# Patient Record
Sex: Male | Born: 1970 | Hispanic: Yes | Marital: Single | Smoking: Former smoker
Health system: Southern US, Community
[De-identification: ages and names within clinical notes are randomized; demographics above are authoritative.]

## PROBLEM LIST (undated history)

## (undated) DIAGNOSIS — E119 Type 2 diabetes mellitus without complications: Secondary | ICD-10-CM

## (undated) DIAGNOSIS — G4730 Sleep apnea, unspecified: Secondary | ICD-10-CM

## (undated) DIAGNOSIS — I10 Essential (primary) hypertension: Secondary | ICD-10-CM

## (undated) DIAGNOSIS — N289 Disorder of kidney and ureter, unspecified: Secondary | ICD-10-CM

## (undated) HISTORY — PX: OTHER SURGICAL HISTORY: SHX169

---

## 2008-04-01 ENCOUNTER — Inpatient Hospital Stay: Payer: Self-pay | Admitting: Psychiatry

## 2009-02-24 ENCOUNTER — Emergency Department: Payer: Self-pay | Admitting: Emergency Medicine

## 2009-06-27 ENCOUNTER — Ambulatory Visit: Payer: Self-pay | Admitting: Family Medicine

## 2009-07-02 ENCOUNTER — Ambulatory Visit: Payer: Self-pay | Admitting: Family Medicine

## 2010-11-17 ENCOUNTER — Emergency Department: Payer: Self-pay | Admitting: Internal Medicine

## 2013-09-29 DIAGNOSIS — E875 Hyperkalemia: Secondary | ICD-10-CM | POA: Insufficient documentation

## 2013-09-29 DIAGNOSIS — I10 Essential (primary) hypertension: Secondary | ICD-10-CM | POA: Insufficient documentation

## 2013-09-29 DIAGNOSIS — N189 Chronic kidney disease, unspecified: Secondary | ICD-10-CM

## 2013-09-29 DIAGNOSIS — N179 Acute kidney failure, unspecified: Secondary | ICD-10-CM | POA: Insufficient documentation

## 2013-09-29 DIAGNOSIS — E119 Type 2 diabetes mellitus without complications: Secondary | ICD-10-CM | POA: Insufficient documentation

## 2013-11-22 DIAGNOSIS — E669 Obesity, unspecified: Secondary | ICD-10-CM | POA: Insufficient documentation

## 2014-01-30 DIAGNOSIS — N184 Chronic kidney disease, stage 4 (severe): Secondary | ICD-10-CM | POA: Insufficient documentation

## 2014-03-26 ENCOUNTER — Ambulatory Visit: Payer: Self-pay | Admitting: Anesthesiology

## 2014-03-26 DIAGNOSIS — Z01810 Encounter for preprocedural cardiovascular examination: Secondary | ICD-10-CM

## 2014-03-26 DIAGNOSIS — I10 Essential (primary) hypertension: Secondary | ICD-10-CM

## 2014-04-04 ENCOUNTER — Ambulatory Visit: Payer: Self-pay | Admitting: Vascular Surgery

## 2014-04-04 LAB — URINALYSIS, COMPLETE
Bilirubin,UR: NEGATIVE
Glucose,UR: 50 mg/dL (ref 0–75)
Hyaline Cast: 9
Ketone: NEGATIVE
LEUKOCYTE ESTERASE: NEGATIVE
NITRITE: NEGATIVE
Ph: 5 (ref 4.5–8.0)
RBC,UR: 1 /HPF (ref 0–5)
SPECIFIC GRAVITY: 1.011 (ref 1.003–1.030)
Squamous Epithelial: <1
WBC UR: 2 /HPF (ref 0–5)

## 2014-04-04 LAB — CBC
HCT: 33.5 % — AB (ref 40.0–52.0)
HGB: 11.4 g/dL — AB (ref 13.0–18.0)
MCH: 30.8 pg (ref 26.0–34.0)
MCHC: 33.9 g/dL (ref 32.0–36.0)
MCV: 91 fL (ref 80–100)
PLATELETS: 226 10*3/uL (ref 150–440)
RBC: 3.69 10*6/uL — ABNORMAL LOW (ref 4.40–5.90)
RDW: 13.3 % (ref 11.5–14.5)
WBC: 7.6 10*3/uL (ref 3.8–10.6)

## 2014-04-04 LAB — BASIC METABOLIC PANEL
Anion Gap: 9 (ref 7–16)
BUN: 68 mg/dL — ABNORMAL HIGH (ref 7–18)
CALCIUM: 7.8 mg/dL — AB (ref 8.5–10.1)
CHLORIDE: 110 mmol/L — AB (ref 98–107)
CO2: 25 mmol/L (ref 21–32)
Creatinine: 4.72 mg/dL — ABNORMAL HIGH (ref 0.60–1.30)
EGFR (African American): 18 — ABNORMAL LOW
EGFR (Non-African Amer.): 15 — ABNORMAL LOW
GLUCOSE: 101 mg/dL — AB (ref 65–99)
Osmolality: 307 (ref 275–301)
POTASSIUM: 5.1 mmol/L (ref 3.5–5.1)
SODIUM: 144 mmol/L (ref 136–145)

## 2014-04-08 ENCOUNTER — Ambulatory Visit: Payer: Self-pay | Admitting: Nephrology

## 2014-04-08 LAB — RENAL FUNCTION PANEL
Albumin: 3.3 g/dL — ABNORMAL LOW (ref 3.4–5.0)
Anion Gap: 8 (ref 7–16)
BUN: 60 mg/dL — ABNORMAL HIGH (ref 7–18)
Calcium, Total: 7.5 mg/dL — ABNORMAL LOW (ref 8.5–10.1)
Chloride: 111 mmol/L — ABNORMAL HIGH (ref 98–107)
Co2: 24 mmol/L (ref 21–32)
Creatinine: 4.89 mg/dL — ABNORMAL HIGH (ref 0.60–1.30)
EGFR (African American): 17 — ABNORMAL LOW
EGFR (Non-African Amer.): 14 — ABNORMAL LOW
Glucose: 103 mg/dL — ABNORMAL HIGH (ref 65–99)
Osmolality: 302 (ref 275–301)
Phosphorus: 3.9 mg/dL (ref 2.5–4.9)
Potassium: 5.1 mmol/L (ref 3.5–5.1)
Sodium: 143 mmol/L (ref 136–145)

## 2014-04-08 LAB — MAGNESIUM: MAGNESIUM: 2 mg/dL

## 2014-04-12 ENCOUNTER — Ambulatory Visit: Payer: Self-pay | Admitting: Vascular Surgery

## 2014-04-15 ENCOUNTER — Ambulatory Visit: Payer: Self-pay | Admitting: Nephrology

## 2014-04-15 LAB — RENAL FUNCTION PANEL
ALBUMIN: 3.3 g/dL — AB (ref 3.4–5.0)
Anion Gap: 9 (ref 7–16)
BUN: 65 mg/dL — AB (ref 7–18)
CALCIUM: 8.2 mg/dL — AB (ref 8.5–10.1)
CHLORIDE: 106 mmol/L (ref 98–107)
CO2: 25 mmol/L (ref 21–32)
Creatinine: 4.84 mg/dL — ABNORMAL HIGH (ref 0.60–1.30)
EGFR (African American): 17 — ABNORMAL LOW
EGFR (Non-African Amer.): 14 — ABNORMAL LOW
GLUCOSE: 110 mg/dL — AB (ref 65–99)
OSMOLALITY: 299 (ref 275–301)
POTASSIUM: 5.5 mmol/L — AB (ref 3.5–5.1)
Phosphorus: 6.3 mg/dL — ABNORMAL HIGH (ref 2.5–4.9)
SODIUM: 140 mmol/L (ref 136–145)

## 2014-04-15 LAB — MAGNESIUM: MAGNESIUM: 2.2 mg/dL

## 2014-04-25 ENCOUNTER — Ambulatory Visit: Payer: Self-pay | Admitting: Vascular Surgery

## 2014-04-30 ENCOUNTER — Ambulatory Visit: Payer: Self-pay | Admitting: Vascular Surgery

## 2014-04-30 LAB — CBC
HCT: 33.9 % — ABNORMAL LOW (ref 40.0–52.0)
HGB: 11.5 g/dL — AB (ref 13.0–18.0)
MCH: 30.6 pg (ref 26.0–34.0)
MCHC: 33.8 g/dL (ref 32.0–36.0)
MCV: 90 fL (ref 80–100)
Platelet: 238 10*3/uL (ref 150–440)
RBC: 3.75 10*6/uL — ABNORMAL LOW (ref 4.40–5.90)
RDW: 13.2 % (ref 11.5–14.5)
WBC: 8.5 10*3/uL (ref 3.8–10.6)

## 2014-04-30 LAB — BASIC METABOLIC PANEL
Anion Gap: 8 (ref 7–16)
BUN: 61 mg/dL — AB (ref 7–18)
CALCIUM: 7.8 mg/dL — AB (ref 8.5–10.1)
CHLORIDE: 109 mmol/L — AB (ref 98–107)
Co2: 25 mmol/L (ref 21–32)
Creatinine: 4.4 mg/dL — ABNORMAL HIGH (ref 0.60–1.30)
EGFR (African American): 19 — ABNORMAL LOW
EGFR (Non-African Amer.): 16 — ABNORMAL LOW
Glucose: 94 mg/dL (ref 65–99)
OSMOLALITY: 300 (ref 275–301)
Potassium: 4.3 mmol/L (ref 3.5–5.1)
SODIUM: 142 mmol/L (ref 136–145)

## 2014-05-03 ENCOUNTER — Ambulatory Visit: Payer: Self-pay | Admitting: Vascular Surgery

## 2014-05-22 ENCOUNTER — Ambulatory Visit: Payer: Self-pay | Admitting: Vascular Surgery

## 2014-10-05 NOTE — Op Note (Signed)
PATIENT NAME:  Jake Gomez, Jake Gomez MR#:  [id number] DATE OF BIRTH:  1971-04-30  DATE OF PROCEDURE:  04/12/2014  PREOPERATIVE DIAGNOSIS: Diffuse muscle pain, elevated creatinine kinase.   POSTOPERATIVE DIAGNOSIS: Diffuse muscle pain, elevated creatinine kinase.   PROCEDURE PERFORMED: Right thigh muscle biopsy.   ANESTHESIA: General.   ESTIMATED BLOOD LOSS: 5 mL.   COMPLICATIONS: None.   SPECIMEN: Right thigh biopsy.   INDICATION FOR SURGERY: [name] is a pleasant 44 year old male with history of elevated creatinine kinase and diffuse muscle aches. He was brought to the Operating Room for a muscle biopsy for possible etiology of rhabdomyolysis.   DESCRIPTION OF PROCEDURE: Informed consent was obtained. [name] was brought to the operating room. He was induced was induced, endotracheal tube was placed. General anesthesia was administered. His abdomen and right leg were prepped and draped in standard surgical fashion. A timeout was then performed correctly identifying the patient, name, operative site, and procedure to be performed as Dr. Delana Meyer performed a peritoneal dialysis catheter placement. I performed a muscle biopsy in the patient's right thigh. An incision was made on his right thigh overlying the muscle. It was deepened down to the fascia. The fascia was incised. The muscle was clamped on both ends and was excised using scalpel. There was minimal bleeding. Two specimens were taken. The second was taken via clamp to prevent retraction. After this was made hemostatic, the wound was closed in layers. A running 3-0 Vicryl suture was used to close the muscular fascia, interrupted 3-0 Vicryl deep dermal was used to approximate the skin, and a 4-0 Monocryl running subcuticular was used to approximate the skin. Dermabond was then placed over the wound. The patient was then awoken after Dr. Nino Parsley part was completed but then drapes were taken down and the patient was brought to the postanesthesia  care unit. There were no immediate complications. Needle, sponge, and instrument count was correct at the end of the procedure.    ____________________________ Glena Norfolk. Tracie Lindbloom, MD cal:at D: 04/12/2014 14:36:25 ET T: 04/12/2014 16:04:26 ET JOB#: [id number]  cc: Harrell Gave A. Kazue Cerro, MD, <Dictator> Floyde Parkins MD ELECTRONICALLY SIGNED 04/16/2014 7:06

## 2014-10-05 NOTE — Op Note (Signed)
PATIENT NAME:  Jake Gomez, Jake Gomez MR#:  [id number] DATE OF BIRTH:  [birth year]  DATE OF PROCEDURE:  05/03/2014  PREOPERATIVE DIAGNOSES: 1. Complication of peritoneal dialysis catheter with poor return and difficulty flushing the catheter.  2. Stage V renal insufficiency.  3. Morbid obesity.   POSTOPERATIVE DIAGNOSES:  1. Complication of peritoneal dialysis catheter with poor return and difficulty flushing the catheter.  2. Stage V renal insufficiency.  3. Morbid obesity.   PROCEDURE PERFORMED: Laparoscopic-assisted revision of peritoneal dialysis catheter.   SURGEON: Katha Cabal, MD.   ANESTHESIA: General by endotracheal intubation.   FLUIDS: Per anesthesia record.   ESTIMATED BLOOD LOSS: Minimal.   SPECIMEN: None.   INDICATIONS: [name] is a 44 year old gentleman who underwent placement of a peritoneal dialysis catheter approximately a month ago. Initially, the catheter did flush well but in the last week they have had increasing difficulty with poor return as well as difficulty flushing the catheter. The risks and benefits were reviewed. All questions answered. The patient has agreed to proceed with revision.   DESCRIPTION OF PROCEDURE: The patient is taken to the operating room and placed in the supine position. General anesthesia is induced and appropriate invasive monitors are placed. He is positioned supine. His abdomen is prepped and draped in a sterile fashion including the catheter. Appropriate timeout is called. Marcaine 0.25% with epinephrine is infiltrated in the soft tissue overlying the skin and incisional scar in the left lower quadrant. This is reincised with a 15 blade. Dissection is carried down to expose the metal connector. KUB demonstrated the possibility of a kink at the upper margin of this metal connector. Upon exposing the connector in the peritoneal catheter tubing for several centimeters both above and below the connector, there is a smooth contour. There is no  evidence whatsoever of any kink or significant angulation.   A saline moistened sponge was then placed in this wound. The previous midline incision was then infiltrated with the Marcaine and then reopened with a 15 blade scalpel. Dissection is carried down to expose the fascia and 2-0 Vicryl sutures are placed opposite the midline and a fresh, clean, 15 blade is used to incise the fascia. The peritoneal cavity is opened without difficulty and the fascial edges grasped with Allis clamps and a Hasson port is put in. The abdomen is insufflated to 15 mmHg pressure with CO2. The 5 mm trocars, 1 in the right lower quadrant, 1 in the midline between the left upper and lower quadrants are then introduced. The catheter is grasped and noted to be filled with fibrinous, proteinaceous material throughout almost its entire length. Catheter was identified in the right lower quadrant laterally, not in the midline down in the pelvis, so this does appear to have moved significantly from the initial placement.   The catheter is then pulled through the trocar in the right lower quadrant and cleaned of all of the fibrinous material. It is irrigated copiously. It is submerged in sterile saline and then aspirated vigorously. It aspirates and flushes quite easily now. The catheter and trocar are then returned to the peritoneal cavity. A Pierce retractor is then advanced through the right lower quadrant trocar and a grasper is used from the left side and the catheter is then advanced down into the deep pelvis, reflecting back the bowels and omental tissues with the Levada Dy to ensure that the catheter is all the way down posterior to the bladder. The catheter is then irrigated with 240 mL of saline and  immediately 225 mL is returned as measured in the emesis basin. The gas is then expelled under direct visualization. The catheter is observed until the gas is essentially gone. The catheter did not move. All trocars are then removed.    The midline incision in the supraumbilical where the Hasson was, was closed with 0 Vicryl and the incision was closed with 2 layers of 3-0 Vicryl, followed by 4-0 Monocryl subcuticular. The left lower quadrant incision was closed with a layer of 2-0 Vicryl, followed by 2 layers of 3-0 Vicryl, followed by 4-0 Monocryl subcuticular and the 5 mm trocar sites were closed with 4-0 Monocryl subcuticular. Dermabond is applied. A new hub and Betadine impregnated cap is applied. Sterile dressing is applied around the exit site of the catheter. The patient tolerated the procedure well. There were no immediate complications. Sponge and needle counts were correct and he was taken to the recovery area in excellent condition.     ____________________________ Katha Cabal, MD ggs:TT D: 05/03/2014 13:32:00 ET T: 05/03/2014 15:23:44 ET JOB#: [id number]  cc: Katha Cabal, MD, <Dictator> Murlean Iba, MD Katha Cabal MD ELECTRONICALLY SIGNED 05/14/2014 13:00

## 2014-10-05 NOTE — Op Note (Signed)
PATIENT NAME:  Jake Gomez, Jake Gomez MR#:  [id number] DATE OF BIRTH:  [birth year]  DATE OF PROCEDURE:  04/12/2014  PREOPERATIVE DIAGNOSIS: Stage IV renal insufficiency.  POSTOPERATIVE DIAGNOSIS: Stage IV renal insufficiency.  PROCEDURE PERFORMED: Laparoscopic-assisted insertion of peritoneal dialysis catheter.   SURGEON: Katha Cabal, MD   ANESTHESIA: General by endotracheal intubation.   FLUIDS: Per anesthesia record.   ESTIMATED BLOOD LOSS: Minimal.   SPECIMEN: None.   INDICATIONS: Jake Gomez is a 44 year old gentleman who has had deterioration of his renal function and will be requiring hemodialysis in the next few months. He is therefore undergoing placement of a peritoneal dialysis catheter in preparation. The risks and benefits are reviewed. All questions answered. The patient has agreed to proceed. Of note, the patient will also be undergoing a muscle biopsy of the right thigh by Dr. Rexene Edison, who will dictate this procedure as a separate procedure, but for the record, it was done on concomitantly under the same anesthesia.   DESCRIPTION OF PROCEDURE: The patient is taken to the operating room and placed in the supine position. After adequate general anesthesia is induced and appropriate invasive monitors placed, his abdomen is prepped and draped in a sterile fashion. Appropriate timeout is called.   A vertical incision is made in the midline supraumbilically and carried down to the fascia. The fascia is then grasped with rat-tooth forceps and an 0 Vicryl pursestring suture is placed.   Small curvilinear incision is created in the left lower quadrant just extending from the mid rectus laterally and, again, the dissection is carried down to expose the fascia. An incision is then made just lateral to the midline approximately 2 fingerbreadths below the costal margin and again the dissection is carried down to essentially expose fascia. Exit site is marked but not selected at this time.    Bone hook is then used to elevate the fascia and a Veress needle is introduced without difficulty. Saline test is positive, and subsequently low-flow insufflation is performed. High flow is then utilized to achieve an intra-abdominal pressure of 15 mmHg.   A 5 mm trocar is then introduced, and the camera. Visual inspection of the viscera does not identify any abnormalities.   With the camera and palpation the left lower quadrant incision is localized, and under direct visualization a needle is inserted into the peritoneal cavity. Subsequently, a pigtail peritoneal dialysis catheter is advanced over a stylet. The catheter is then positioned down deep within the pelvis and the stylet is removed. The fascia is spread open slightly and the cuff is advanced below the anterior fascia of the rectus sheath. A pursestring suture of 0 Vicryl is used to secure this.  A tunneling device is then used to pull the second half of the peritoneal dialysis catheter from the upper incision to the left lower quadrant incision, and the metal connector is used to attach the two and secured with 2-0 Ethibond around the metal connector. The catheter is then adjusted for length, the tunneling device used to create the curvilinear exit site, and then a small incision is made and the catheter is pulled through the exit site. The upper incision is then checked to make sure the catheter is not kinked. It has a very smooth contour. A plastic hub is then connected and 300 mL of saline is instilled, with the immediate return of 250 mL. The catheter is then trimmed and the titanium connector is placed. The hub assembly and then the Betadine cap are placed.  All 3 incisions are then irrigated. Trocar is removed from the midline and the pursestring sutures used to secure the fascia. The incisions are then closed in multiple layers using 2-0 Vicryl followed by 3-0 Vicryl, and the skin is all closed with 4-0 Monocryl. Dermabond is then  placed. The patient tolerated the procedure well. There were no immediate complications. Sponge and needle counts were correct, and he is taken to the recovery area in excellent condition.    ____________________________ Katha Cabal, MD ggs:ST D: 04/12/2014 18:00:09 ET T: 04/13/2014 00:39:11 ET JOB#: [id number]  cc: Katha Cabal, MD, <Dictator> Murlean Iba, MD Katha Cabal MD ELECTRONICALLY SIGNED 04/17/2014 11:46

## 2014-10-05 NOTE — Op Note (Signed)
PATIENT NAME:  Jake Gomez, Jake Gomez MR#:  [id number] DATE OF BIRTH:  1971-06-01  DATE OF PROCEDURE:  05/22/2014  PREOPERATIVE DIAGNOSES:  1.  End-stage renal disease.  2.  Poorly functioning peritoneal dialysis catheter.   POSTOPERATIVE DIAGNOSES:  1.  End-stage renal disease.  2.  Poorly functioning peritoneal dialysis catheter.   PROCEDURES: Diagnostic laparoscopy.   SURGEON: Algernon Huxley, MD.    ANESTHESIA: General.   ESTIMATED BLOOD LOSS: Minimal.   INDICATION FOR PROCEDURE: This is a gentleman with end-stage renal disease. He has a peritoneal dialysis catheter. Our office was contacted yesterday to report that his catheter would not flush or drain over the weekend and that he still desired to try to salvage this.  As such he is brought in for a laparoscopic evaluation and any procedure that can salvage the catheter.  The risks and benefits were discussed. Informed consent was obtained.   DESCRIPTION OF PROCEDURE: The patient was brought to the operative suite and after an adequate level of general anesthesia was obtained the abdomen was sterilely prepped and draped and a sterile surgical field was created. I entered the right upper quadrant with an Optiview trocar and insufflated the abdomen with carbon dioxide. The catheter was in the pelvis, appeared to be in good location, there were not dense adhesions or significant fibrinous exudate around the catheter. At this point I flushed the catheter manually with a small amount of sterile saline. It flushed well. I then elected to dwell the abdomen and drain it, 500 mL of sterile saline were run in and it ran briskly without any difficulty.  The bag was then put on the ground and 500 mL of effluent returned immediately which was clear and not fibrinous or bloody. At this point the catheter appeared to be in good location, it appeared to be functioning reasonably well, so I elected to terminate the procedure. The trocar was removed. The abdomen was  desufflated of carbon dioxide. The incision was closed with 3-0 Vicryl and 4-0 Monocryl and Dermabond was placed as dressing. The patient was awakened from anesthesia and taken to the recovery room in stable condition having tolerated the procedure well.    ____________________________ Algernon Huxley, MD jsd:bu D: 05/22/2014 14:34:52 ET T: 05/22/2014 18:05:09 ET JOB#: [id number]  cc: Algernon Huxley, MD, <Dictator> Algernon Huxley MD ELECTRONICALLY SIGNED 06/13/2014 14:15

## 2014-10-07 LAB — SURGICAL PATHOLOGY

## 2015-11-14 DIAGNOSIS — E1139 Type 2 diabetes mellitus with other diabetic ophthalmic complication: Secondary | ICD-10-CM | POA: Diagnosis not present

## 2016-01-13 DIAGNOSIS — D631 Anemia in chronic kidney disease: Secondary | ICD-10-CM | POA: Diagnosis not present

## 2016-01-13 DIAGNOSIS — D509 Iron deficiency anemia, unspecified: Secondary | ICD-10-CM | POA: Diagnosis not present

## 2016-01-13 DIAGNOSIS — N186 End stage renal disease: Secondary | ICD-10-CM | POA: Diagnosis not present

## 2016-01-13 DIAGNOSIS — Z992 Dependence on renal dialysis: Secondary | ICD-10-CM | POA: Diagnosis not present

## 2016-01-14 DIAGNOSIS — N186 End stage renal disease: Secondary | ICD-10-CM | POA: Diagnosis not present

## 2016-01-14 DIAGNOSIS — Z992 Dependence on renal dialysis: Secondary | ICD-10-CM | POA: Diagnosis not present

## 2016-01-14 DIAGNOSIS — D509 Iron deficiency anemia, unspecified: Secondary | ICD-10-CM | POA: Diagnosis not present

## 2016-01-14 DIAGNOSIS — D631 Anemia in chronic kidney disease: Secondary | ICD-10-CM | POA: Diagnosis not present

## 2016-01-15 DIAGNOSIS — D509 Iron deficiency anemia, unspecified: Secondary | ICD-10-CM | POA: Diagnosis not present

## 2016-01-15 DIAGNOSIS — N186 End stage renal disease: Secondary | ICD-10-CM | POA: Diagnosis not present

## 2016-01-15 DIAGNOSIS — D631 Anemia in chronic kidney disease: Secondary | ICD-10-CM | POA: Diagnosis not present

## 2016-01-15 DIAGNOSIS — Z992 Dependence on renal dialysis: Secondary | ICD-10-CM | POA: Diagnosis not present

## 2016-01-16 DIAGNOSIS — N186 End stage renal disease: Secondary | ICD-10-CM | POA: Diagnosis not present

## 2016-01-16 DIAGNOSIS — D509 Iron deficiency anemia, unspecified: Secondary | ICD-10-CM | POA: Diagnosis not present

## 2016-01-16 DIAGNOSIS — Z992 Dependence on renal dialysis: Secondary | ICD-10-CM | POA: Diagnosis not present

## 2016-01-16 DIAGNOSIS — D631 Anemia in chronic kidney disease: Secondary | ICD-10-CM | POA: Diagnosis not present

## 2016-01-17 DIAGNOSIS — D509 Iron deficiency anemia, unspecified: Secondary | ICD-10-CM | POA: Diagnosis not present

## 2016-01-17 DIAGNOSIS — Z992 Dependence on renal dialysis: Secondary | ICD-10-CM | POA: Diagnosis not present

## 2016-01-17 DIAGNOSIS — D631 Anemia in chronic kidney disease: Secondary | ICD-10-CM | POA: Diagnosis not present

## 2016-01-17 DIAGNOSIS — N186 End stage renal disease: Secondary | ICD-10-CM | POA: Diagnosis not present

## 2016-01-18 DIAGNOSIS — N186 End stage renal disease: Secondary | ICD-10-CM | POA: Diagnosis not present

## 2016-01-18 DIAGNOSIS — D631 Anemia in chronic kidney disease: Secondary | ICD-10-CM | POA: Diagnosis not present

## 2016-01-18 DIAGNOSIS — D509 Iron deficiency anemia, unspecified: Secondary | ICD-10-CM | POA: Diagnosis not present

## 2016-01-18 DIAGNOSIS — Z992 Dependence on renal dialysis: Secondary | ICD-10-CM | POA: Diagnosis not present

## 2016-01-19 DIAGNOSIS — Z992 Dependence on renal dialysis: Secondary | ICD-10-CM | POA: Diagnosis not present

## 2016-01-19 DIAGNOSIS — N186 End stage renal disease: Secondary | ICD-10-CM | POA: Diagnosis not present

## 2016-01-19 DIAGNOSIS — D631 Anemia in chronic kidney disease: Secondary | ICD-10-CM | POA: Diagnosis not present

## 2016-01-19 DIAGNOSIS — D509 Iron deficiency anemia, unspecified: Secondary | ICD-10-CM | POA: Diagnosis not present

## 2016-01-20 DIAGNOSIS — N186 End stage renal disease: Secondary | ICD-10-CM | POA: Diagnosis not present

## 2016-01-20 DIAGNOSIS — D631 Anemia in chronic kidney disease: Secondary | ICD-10-CM | POA: Diagnosis not present

## 2016-01-20 DIAGNOSIS — D509 Iron deficiency anemia, unspecified: Secondary | ICD-10-CM | POA: Diagnosis not present

## 2016-01-20 DIAGNOSIS — Z992 Dependence on renal dialysis: Secondary | ICD-10-CM | POA: Diagnosis not present

## 2016-01-21 DIAGNOSIS — D509 Iron deficiency anemia, unspecified: Secondary | ICD-10-CM | POA: Diagnosis not present

## 2016-01-21 DIAGNOSIS — Z992 Dependence on renal dialysis: Secondary | ICD-10-CM | POA: Diagnosis not present

## 2016-01-21 DIAGNOSIS — N186 End stage renal disease: Secondary | ICD-10-CM | POA: Diagnosis not present

## 2016-01-21 DIAGNOSIS — D631 Anemia in chronic kidney disease: Secondary | ICD-10-CM | POA: Diagnosis not present

## 2016-01-22 DIAGNOSIS — N186 End stage renal disease: Secondary | ICD-10-CM | POA: Diagnosis not present

## 2016-01-22 DIAGNOSIS — D509 Iron deficiency anemia, unspecified: Secondary | ICD-10-CM | POA: Diagnosis not present

## 2016-01-22 DIAGNOSIS — D631 Anemia in chronic kidney disease: Secondary | ICD-10-CM | POA: Diagnosis not present

## 2016-01-22 DIAGNOSIS — Z992 Dependence on renal dialysis: Secondary | ICD-10-CM | POA: Diagnosis not present

## 2016-01-23 DIAGNOSIS — D631 Anemia in chronic kidney disease: Secondary | ICD-10-CM | POA: Diagnosis not present

## 2016-01-23 DIAGNOSIS — N186 End stage renal disease: Secondary | ICD-10-CM | POA: Diagnosis not present

## 2016-01-23 DIAGNOSIS — Z992 Dependence on renal dialysis: Secondary | ICD-10-CM | POA: Diagnosis not present

## 2016-01-23 DIAGNOSIS — D509 Iron deficiency anemia, unspecified: Secondary | ICD-10-CM | POA: Diagnosis not present

## 2016-01-24 DIAGNOSIS — N186 End stage renal disease: Secondary | ICD-10-CM | POA: Diagnosis not present

## 2016-01-24 DIAGNOSIS — D631 Anemia in chronic kidney disease: Secondary | ICD-10-CM | POA: Diagnosis not present

## 2016-01-24 DIAGNOSIS — Z992 Dependence on renal dialysis: Secondary | ICD-10-CM | POA: Diagnosis not present

## 2016-01-24 DIAGNOSIS — D509 Iron deficiency anemia, unspecified: Secondary | ICD-10-CM | POA: Diagnosis not present

## 2016-01-25 DIAGNOSIS — Z992 Dependence on renal dialysis: Secondary | ICD-10-CM | POA: Diagnosis not present

## 2016-01-25 DIAGNOSIS — D509 Iron deficiency anemia, unspecified: Secondary | ICD-10-CM | POA: Diagnosis not present

## 2016-01-25 DIAGNOSIS — D631 Anemia in chronic kidney disease: Secondary | ICD-10-CM | POA: Diagnosis not present

## 2016-01-25 DIAGNOSIS — N186 End stage renal disease: Secondary | ICD-10-CM | POA: Diagnosis not present

## 2016-01-26 DIAGNOSIS — N186 End stage renal disease: Secondary | ICD-10-CM | POA: Diagnosis not present

## 2016-01-26 DIAGNOSIS — D509 Iron deficiency anemia, unspecified: Secondary | ICD-10-CM | POA: Diagnosis not present

## 2016-01-26 DIAGNOSIS — Z992 Dependence on renal dialysis: Secondary | ICD-10-CM | POA: Diagnosis not present

## 2016-01-26 DIAGNOSIS — D631 Anemia in chronic kidney disease: Secondary | ICD-10-CM | POA: Diagnosis not present

## 2016-01-27 DIAGNOSIS — N186 End stage renal disease: Secondary | ICD-10-CM | POA: Diagnosis not present

## 2016-01-27 DIAGNOSIS — D631 Anemia in chronic kidney disease: Secondary | ICD-10-CM | POA: Diagnosis not present

## 2016-01-27 DIAGNOSIS — Z992 Dependence on renal dialysis: Secondary | ICD-10-CM | POA: Diagnosis not present

## 2016-01-27 DIAGNOSIS — D509 Iron deficiency anemia, unspecified: Secondary | ICD-10-CM | POA: Diagnosis not present

## 2016-01-28 DIAGNOSIS — N186 End stage renal disease: Secondary | ICD-10-CM | POA: Diagnosis not present

## 2016-01-28 DIAGNOSIS — D631 Anemia in chronic kidney disease: Secondary | ICD-10-CM | POA: Diagnosis not present

## 2016-01-28 DIAGNOSIS — Z992 Dependence on renal dialysis: Secondary | ICD-10-CM | POA: Diagnosis not present

## 2016-01-28 DIAGNOSIS — D509 Iron deficiency anemia, unspecified: Secondary | ICD-10-CM | POA: Diagnosis not present

## 2016-01-29 DIAGNOSIS — D631 Anemia in chronic kidney disease: Secondary | ICD-10-CM | POA: Diagnosis not present

## 2016-01-29 DIAGNOSIS — D509 Iron deficiency anemia, unspecified: Secondary | ICD-10-CM | POA: Diagnosis not present

## 2016-01-29 DIAGNOSIS — Z992 Dependence on renal dialysis: Secondary | ICD-10-CM | POA: Diagnosis not present

## 2016-01-29 DIAGNOSIS — N186 End stage renal disease: Secondary | ICD-10-CM | POA: Diagnosis not present

## 2016-01-30 DIAGNOSIS — D631 Anemia in chronic kidney disease: Secondary | ICD-10-CM | POA: Diagnosis not present

## 2016-01-30 DIAGNOSIS — N186 End stage renal disease: Secondary | ICD-10-CM | POA: Diagnosis not present

## 2016-01-30 DIAGNOSIS — Z992 Dependence on renal dialysis: Secondary | ICD-10-CM | POA: Diagnosis not present

## 2016-01-30 DIAGNOSIS — D509 Iron deficiency anemia, unspecified: Secondary | ICD-10-CM | POA: Diagnosis not present

## 2016-01-31 DIAGNOSIS — N186 End stage renal disease: Secondary | ICD-10-CM | POA: Diagnosis not present

## 2016-01-31 DIAGNOSIS — D509 Iron deficiency anemia, unspecified: Secondary | ICD-10-CM | POA: Diagnosis not present

## 2016-01-31 DIAGNOSIS — D631 Anemia in chronic kidney disease: Secondary | ICD-10-CM | POA: Diagnosis not present

## 2016-01-31 DIAGNOSIS — Z992 Dependence on renal dialysis: Secondary | ICD-10-CM | POA: Diagnosis not present

## 2016-02-01 DIAGNOSIS — D509 Iron deficiency anemia, unspecified: Secondary | ICD-10-CM | POA: Diagnosis not present

## 2016-02-01 DIAGNOSIS — N186 End stage renal disease: Secondary | ICD-10-CM | POA: Diagnosis not present

## 2016-02-01 DIAGNOSIS — Z992 Dependence on renal dialysis: Secondary | ICD-10-CM | POA: Diagnosis not present

## 2016-02-01 DIAGNOSIS — D631 Anemia in chronic kidney disease: Secondary | ICD-10-CM | POA: Diagnosis not present

## 2016-02-02 DIAGNOSIS — D509 Iron deficiency anemia, unspecified: Secondary | ICD-10-CM | POA: Diagnosis not present

## 2016-02-02 DIAGNOSIS — Z992 Dependence on renal dialysis: Secondary | ICD-10-CM | POA: Diagnosis not present

## 2016-02-02 DIAGNOSIS — N186 End stage renal disease: Secondary | ICD-10-CM | POA: Diagnosis not present

## 2016-02-02 DIAGNOSIS — D631 Anemia in chronic kidney disease: Secondary | ICD-10-CM | POA: Diagnosis not present

## 2016-02-03 DIAGNOSIS — D631 Anemia in chronic kidney disease: Secondary | ICD-10-CM | POA: Diagnosis not present

## 2016-02-03 DIAGNOSIS — Z992 Dependence on renal dialysis: Secondary | ICD-10-CM | POA: Diagnosis not present

## 2016-02-03 DIAGNOSIS — D509 Iron deficiency anemia, unspecified: Secondary | ICD-10-CM | POA: Diagnosis not present

## 2016-02-03 DIAGNOSIS — N186 End stage renal disease: Secondary | ICD-10-CM | POA: Diagnosis not present

## 2016-02-04 DIAGNOSIS — N186 End stage renal disease: Secondary | ICD-10-CM | POA: Diagnosis not present

## 2016-02-04 DIAGNOSIS — Z992 Dependence on renal dialysis: Secondary | ICD-10-CM | POA: Diagnosis not present

## 2016-02-04 DIAGNOSIS — D509 Iron deficiency anemia, unspecified: Secondary | ICD-10-CM | POA: Diagnosis not present

## 2016-02-04 DIAGNOSIS — D631 Anemia in chronic kidney disease: Secondary | ICD-10-CM | POA: Diagnosis not present

## 2016-02-05 DIAGNOSIS — D509 Iron deficiency anemia, unspecified: Secondary | ICD-10-CM | POA: Diagnosis not present

## 2016-02-05 DIAGNOSIS — D631 Anemia in chronic kidney disease: Secondary | ICD-10-CM | POA: Diagnosis not present

## 2016-02-05 DIAGNOSIS — N186 End stage renal disease: Secondary | ICD-10-CM | POA: Diagnosis not present

## 2016-02-05 DIAGNOSIS — Z992 Dependence on renal dialysis: Secondary | ICD-10-CM | POA: Diagnosis not present

## 2016-02-06 DIAGNOSIS — D509 Iron deficiency anemia, unspecified: Secondary | ICD-10-CM | POA: Diagnosis not present

## 2016-02-06 DIAGNOSIS — Z992 Dependence on renal dialysis: Secondary | ICD-10-CM | POA: Diagnosis not present

## 2016-02-06 DIAGNOSIS — N186 End stage renal disease: Secondary | ICD-10-CM | POA: Diagnosis not present

## 2016-02-06 DIAGNOSIS — D631 Anemia in chronic kidney disease: Secondary | ICD-10-CM | POA: Diagnosis not present

## 2016-02-07 DIAGNOSIS — D509 Iron deficiency anemia, unspecified: Secondary | ICD-10-CM | POA: Diagnosis not present

## 2016-02-07 DIAGNOSIS — Z992 Dependence on renal dialysis: Secondary | ICD-10-CM | POA: Diagnosis not present

## 2016-02-07 DIAGNOSIS — D631 Anemia in chronic kidney disease: Secondary | ICD-10-CM | POA: Diagnosis not present

## 2016-02-07 DIAGNOSIS — N186 End stage renal disease: Secondary | ICD-10-CM | POA: Diagnosis not present

## 2016-02-08 DIAGNOSIS — D631 Anemia in chronic kidney disease: Secondary | ICD-10-CM | POA: Diagnosis not present

## 2016-02-08 DIAGNOSIS — N186 End stage renal disease: Secondary | ICD-10-CM | POA: Diagnosis not present

## 2016-02-08 DIAGNOSIS — Z992 Dependence on renal dialysis: Secondary | ICD-10-CM | POA: Diagnosis not present

## 2016-02-08 DIAGNOSIS — D509 Iron deficiency anemia, unspecified: Secondary | ICD-10-CM | POA: Diagnosis not present

## 2016-02-09 DIAGNOSIS — D631 Anemia in chronic kidney disease: Secondary | ICD-10-CM | POA: Diagnosis not present

## 2016-02-09 DIAGNOSIS — D509 Iron deficiency anemia, unspecified: Secondary | ICD-10-CM | POA: Diagnosis not present

## 2016-02-09 DIAGNOSIS — Z992 Dependence on renal dialysis: Secondary | ICD-10-CM | POA: Diagnosis not present

## 2016-02-09 DIAGNOSIS — N186 End stage renal disease: Secondary | ICD-10-CM | POA: Diagnosis not present

## 2016-02-10 DIAGNOSIS — D509 Iron deficiency anemia, unspecified: Secondary | ICD-10-CM | POA: Diagnosis not present

## 2016-02-10 DIAGNOSIS — Z992 Dependence on renal dialysis: Secondary | ICD-10-CM | POA: Diagnosis not present

## 2016-02-10 DIAGNOSIS — N186 End stage renal disease: Secondary | ICD-10-CM | POA: Diagnosis not present

## 2016-02-10 DIAGNOSIS — D631 Anemia in chronic kidney disease: Secondary | ICD-10-CM | POA: Diagnosis not present

## 2016-02-11 DIAGNOSIS — D509 Iron deficiency anemia, unspecified: Secondary | ICD-10-CM | POA: Diagnosis not present

## 2016-02-11 DIAGNOSIS — Z992 Dependence on renal dialysis: Secondary | ICD-10-CM | POA: Diagnosis not present

## 2016-02-11 DIAGNOSIS — D631 Anemia in chronic kidney disease: Secondary | ICD-10-CM | POA: Diagnosis not present

## 2016-02-11 DIAGNOSIS — N186 End stage renal disease: Secondary | ICD-10-CM | POA: Diagnosis not present

## 2016-02-12 DIAGNOSIS — D631 Anemia in chronic kidney disease: Secondary | ICD-10-CM | POA: Diagnosis not present

## 2016-02-12 DIAGNOSIS — D509 Iron deficiency anemia, unspecified: Secondary | ICD-10-CM | POA: Diagnosis not present

## 2016-02-12 DIAGNOSIS — N186 End stage renal disease: Secondary | ICD-10-CM | POA: Diagnosis not present

## 2016-02-12 DIAGNOSIS — Z992 Dependence on renal dialysis: Secondary | ICD-10-CM | POA: Diagnosis not present

## 2016-02-13 DIAGNOSIS — N186 End stage renal disease: Secondary | ICD-10-CM | POA: Diagnosis not present

## 2016-02-13 DIAGNOSIS — D509 Iron deficiency anemia, unspecified: Secondary | ICD-10-CM | POA: Diagnosis not present

## 2016-02-13 DIAGNOSIS — D631 Anemia in chronic kidney disease: Secondary | ICD-10-CM | POA: Diagnosis not present

## 2016-02-13 DIAGNOSIS — Z23 Encounter for immunization: Secondary | ICD-10-CM | POA: Diagnosis not present

## 2016-02-13 DIAGNOSIS — Z992 Dependence on renal dialysis: Secondary | ICD-10-CM | POA: Diagnosis not present

## 2016-02-14 DIAGNOSIS — N186 End stage renal disease: Secondary | ICD-10-CM | POA: Diagnosis not present

## 2016-02-14 DIAGNOSIS — D631 Anemia in chronic kidney disease: Secondary | ICD-10-CM | POA: Diagnosis not present

## 2016-02-14 DIAGNOSIS — Z23 Encounter for immunization: Secondary | ICD-10-CM | POA: Diagnosis not present

## 2016-02-14 DIAGNOSIS — Z992 Dependence on renal dialysis: Secondary | ICD-10-CM | POA: Diagnosis not present

## 2016-02-14 DIAGNOSIS — D509 Iron deficiency anemia, unspecified: Secondary | ICD-10-CM | POA: Diagnosis not present

## 2016-02-15 DIAGNOSIS — Z23 Encounter for immunization: Secondary | ICD-10-CM | POA: Diagnosis not present

## 2016-02-15 DIAGNOSIS — D631 Anemia in chronic kidney disease: Secondary | ICD-10-CM | POA: Diagnosis not present

## 2016-02-15 DIAGNOSIS — N186 End stage renal disease: Secondary | ICD-10-CM | POA: Diagnosis not present

## 2016-02-15 DIAGNOSIS — Z992 Dependence on renal dialysis: Secondary | ICD-10-CM | POA: Diagnosis not present

## 2016-02-15 DIAGNOSIS — D509 Iron deficiency anemia, unspecified: Secondary | ICD-10-CM | POA: Diagnosis not present

## 2016-02-16 DIAGNOSIS — D509 Iron deficiency anemia, unspecified: Secondary | ICD-10-CM | POA: Diagnosis not present

## 2016-02-16 DIAGNOSIS — D631 Anemia in chronic kidney disease: Secondary | ICD-10-CM | POA: Diagnosis not present

## 2016-02-16 DIAGNOSIS — Z23 Encounter for immunization: Secondary | ICD-10-CM | POA: Diagnosis not present

## 2016-02-16 DIAGNOSIS — N186 End stage renal disease: Secondary | ICD-10-CM | POA: Diagnosis not present

## 2016-02-16 DIAGNOSIS — Z992 Dependence on renal dialysis: Secondary | ICD-10-CM | POA: Diagnosis not present

## 2016-02-17 DIAGNOSIS — N186 End stage renal disease: Secondary | ICD-10-CM | POA: Diagnosis not present

## 2016-02-17 DIAGNOSIS — Z992 Dependence on renal dialysis: Secondary | ICD-10-CM | POA: Diagnosis not present

## 2016-02-17 DIAGNOSIS — Z23 Encounter for immunization: Secondary | ICD-10-CM | POA: Diagnosis not present

## 2016-02-17 DIAGNOSIS — D509 Iron deficiency anemia, unspecified: Secondary | ICD-10-CM | POA: Diagnosis not present

## 2016-02-17 DIAGNOSIS — D631 Anemia in chronic kidney disease: Secondary | ICD-10-CM | POA: Diagnosis not present

## 2016-02-18 DIAGNOSIS — Z992 Dependence on renal dialysis: Secondary | ICD-10-CM | POA: Diagnosis not present

## 2016-02-18 DIAGNOSIS — N186 End stage renal disease: Secondary | ICD-10-CM | POA: Diagnosis not present

## 2016-02-18 DIAGNOSIS — D631 Anemia in chronic kidney disease: Secondary | ICD-10-CM | POA: Diagnosis not present

## 2016-02-18 DIAGNOSIS — D509 Iron deficiency anemia, unspecified: Secondary | ICD-10-CM | POA: Diagnosis not present

## 2016-02-18 DIAGNOSIS — Z23 Encounter for immunization: Secondary | ICD-10-CM | POA: Diagnosis not present

## 2016-02-19 DIAGNOSIS — D509 Iron deficiency anemia, unspecified: Secondary | ICD-10-CM | POA: Diagnosis not present

## 2016-02-19 DIAGNOSIS — Z992 Dependence on renal dialysis: Secondary | ICD-10-CM | POA: Diagnosis not present

## 2016-02-19 DIAGNOSIS — N186 End stage renal disease: Secondary | ICD-10-CM | POA: Diagnosis not present

## 2016-02-19 DIAGNOSIS — Z23 Encounter for immunization: Secondary | ICD-10-CM | POA: Diagnosis not present

## 2016-02-19 DIAGNOSIS — D631 Anemia in chronic kidney disease: Secondary | ICD-10-CM | POA: Diagnosis not present

## 2016-02-20 DIAGNOSIS — D631 Anemia in chronic kidney disease: Secondary | ICD-10-CM | POA: Diagnosis not present

## 2016-02-20 DIAGNOSIS — Z23 Encounter for immunization: Secondary | ICD-10-CM | POA: Diagnosis not present

## 2016-02-20 DIAGNOSIS — D509 Iron deficiency anemia, unspecified: Secondary | ICD-10-CM | POA: Diagnosis not present

## 2016-02-20 DIAGNOSIS — Z992 Dependence on renal dialysis: Secondary | ICD-10-CM | POA: Diagnosis not present

## 2016-02-20 DIAGNOSIS — N186 End stage renal disease: Secondary | ICD-10-CM | POA: Diagnosis not present

## 2016-02-21 DIAGNOSIS — Z992 Dependence on renal dialysis: Secondary | ICD-10-CM | POA: Diagnosis not present

## 2016-02-21 DIAGNOSIS — Z23 Encounter for immunization: Secondary | ICD-10-CM | POA: Diagnosis not present

## 2016-02-21 DIAGNOSIS — D631 Anemia in chronic kidney disease: Secondary | ICD-10-CM | POA: Diagnosis not present

## 2016-02-21 DIAGNOSIS — N186 End stage renal disease: Secondary | ICD-10-CM | POA: Diagnosis not present

## 2016-02-21 DIAGNOSIS — D509 Iron deficiency anemia, unspecified: Secondary | ICD-10-CM | POA: Diagnosis not present

## 2016-02-22 DIAGNOSIS — D631 Anemia in chronic kidney disease: Secondary | ICD-10-CM | POA: Diagnosis not present

## 2016-02-22 DIAGNOSIS — D509 Iron deficiency anemia, unspecified: Secondary | ICD-10-CM | POA: Diagnosis not present

## 2016-02-22 DIAGNOSIS — N186 End stage renal disease: Secondary | ICD-10-CM | POA: Diagnosis not present

## 2016-02-22 DIAGNOSIS — Z992 Dependence on renal dialysis: Secondary | ICD-10-CM | POA: Diagnosis not present

## 2016-02-22 DIAGNOSIS — Z23 Encounter for immunization: Secondary | ICD-10-CM | POA: Diagnosis not present

## 2016-02-23 DIAGNOSIS — N186 End stage renal disease: Secondary | ICD-10-CM | POA: Diagnosis not present

## 2016-02-23 DIAGNOSIS — D509 Iron deficiency anemia, unspecified: Secondary | ICD-10-CM | POA: Diagnosis not present

## 2016-02-23 DIAGNOSIS — Z23 Encounter for immunization: Secondary | ICD-10-CM | POA: Diagnosis not present

## 2016-02-23 DIAGNOSIS — Z992 Dependence on renal dialysis: Secondary | ICD-10-CM | POA: Diagnosis not present

## 2016-02-23 DIAGNOSIS — D631 Anemia in chronic kidney disease: Secondary | ICD-10-CM | POA: Diagnosis not present

## 2016-02-24 DIAGNOSIS — D509 Iron deficiency anemia, unspecified: Secondary | ICD-10-CM | POA: Diagnosis not present

## 2016-02-24 DIAGNOSIS — N186 End stage renal disease: Secondary | ICD-10-CM | POA: Diagnosis not present

## 2016-02-24 DIAGNOSIS — Z23 Encounter for immunization: Secondary | ICD-10-CM | POA: Diagnosis not present

## 2016-02-24 DIAGNOSIS — Z992 Dependence on renal dialysis: Secondary | ICD-10-CM | POA: Diagnosis not present

## 2016-02-24 DIAGNOSIS — D631 Anemia in chronic kidney disease: Secondary | ICD-10-CM | POA: Diagnosis not present

## 2016-02-25 DIAGNOSIS — D631 Anemia in chronic kidney disease: Secondary | ICD-10-CM | POA: Diagnosis not present

## 2016-02-25 DIAGNOSIS — N186 End stage renal disease: Secondary | ICD-10-CM | POA: Diagnosis not present

## 2016-02-25 DIAGNOSIS — Z992 Dependence on renal dialysis: Secondary | ICD-10-CM | POA: Diagnosis not present

## 2016-02-25 DIAGNOSIS — Z23 Encounter for immunization: Secondary | ICD-10-CM | POA: Diagnosis not present

## 2016-02-25 DIAGNOSIS — D509 Iron deficiency anemia, unspecified: Secondary | ICD-10-CM | POA: Diagnosis not present

## 2016-02-26 DIAGNOSIS — Z23 Encounter for immunization: Secondary | ICD-10-CM | POA: Diagnosis not present

## 2016-02-26 DIAGNOSIS — Z992 Dependence on renal dialysis: Secondary | ICD-10-CM | POA: Diagnosis not present

## 2016-02-26 DIAGNOSIS — D631 Anemia in chronic kidney disease: Secondary | ICD-10-CM | POA: Diagnosis not present

## 2016-02-26 DIAGNOSIS — D509 Iron deficiency anemia, unspecified: Secondary | ICD-10-CM | POA: Diagnosis not present

## 2016-02-26 DIAGNOSIS — N186 End stage renal disease: Secondary | ICD-10-CM | POA: Diagnosis not present

## 2016-02-27 DIAGNOSIS — D631 Anemia in chronic kidney disease: Secondary | ICD-10-CM | POA: Diagnosis not present

## 2016-02-27 DIAGNOSIS — N186 End stage renal disease: Secondary | ICD-10-CM | POA: Diagnosis not present

## 2016-02-27 DIAGNOSIS — Z23 Encounter for immunization: Secondary | ICD-10-CM | POA: Diagnosis not present

## 2016-02-27 DIAGNOSIS — D509 Iron deficiency anemia, unspecified: Secondary | ICD-10-CM | POA: Diagnosis not present

## 2016-02-27 DIAGNOSIS — Z992 Dependence on renal dialysis: Secondary | ICD-10-CM | POA: Diagnosis not present

## 2016-02-28 DIAGNOSIS — N186 End stage renal disease: Secondary | ICD-10-CM | POA: Diagnosis not present

## 2016-02-28 DIAGNOSIS — Z992 Dependence on renal dialysis: Secondary | ICD-10-CM | POA: Diagnosis not present

## 2016-02-28 DIAGNOSIS — D631 Anemia in chronic kidney disease: Secondary | ICD-10-CM | POA: Diagnosis not present

## 2016-02-28 DIAGNOSIS — D509 Iron deficiency anemia, unspecified: Secondary | ICD-10-CM | POA: Diagnosis not present

## 2016-02-28 DIAGNOSIS — Z23 Encounter for immunization: Secondary | ICD-10-CM | POA: Diagnosis not present

## 2016-02-29 DIAGNOSIS — Z992 Dependence on renal dialysis: Secondary | ICD-10-CM | POA: Diagnosis not present

## 2016-02-29 DIAGNOSIS — D509 Iron deficiency anemia, unspecified: Secondary | ICD-10-CM | POA: Diagnosis not present

## 2016-02-29 DIAGNOSIS — N186 End stage renal disease: Secondary | ICD-10-CM | POA: Diagnosis not present

## 2016-02-29 DIAGNOSIS — D631 Anemia in chronic kidney disease: Secondary | ICD-10-CM | POA: Diagnosis not present

## 2016-02-29 DIAGNOSIS — Z23 Encounter for immunization: Secondary | ICD-10-CM | POA: Diagnosis not present

## 2016-03-01 DIAGNOSIS — Z23 Encounter for immunization: Secondary | ICD-10-CM | POA: Diagnosis not present

## 2016-03-01 DIAGNOSIS — D509 Iron deficiency anemia, unspecified: Secondary | ICD-10-CM | POA: Diagnosis not present

## 2016-03-01 DIAGNOSIS — N186 End stage renal disease: Secondary | ICD-10-CM | POA: Diagnosis not present

## 2016-03-01 DIAGNOSIS — D631 Anemia in chronic kidney disease: Secondary | ICD-10-CM | POA: Diagnosis not present

## 2016-03-01 DIAGNOSIS — Z992 Dependence on renal dialysis: Secondary | ICD-10-CM | POA: Diagnosis not present

## 2016-03-02 DIAGNOSIS — D631 Anemia in chronic kidney disease: Secondary | ICD-10-CM | POA: Diagnosis not present

## 2016-03-02 DIAGNOSIS — D509 Iron deficiency anemia, unspecified: Secondary | ICD-10-CM | POA: Diagnosis not present

## 2016-03-02 DIAGNOSIS — N186 End stage renal disease: Secondary | ICD-10-CM | POA: Diagnosis not present

## 2016-03-02 DIAGNOSIS — Z992 Dependence on renal dialysis: Secondary | ICD-10-CM | POA: Diagnosis not present

## 2016-03-02 DIAGNOSIS — Z23 Encounter for immunization: Secondary | ICD-10-CM | POA: Diagnosis not present

## 2016-03-03 DIAGNOSIS — D631 Anemia in chronic kidney disease: Secondary | ICD-10-CM | POA: Diagnosis not present

## 2016-03-03 DIAGNOSIS — D509 Iron deficiency anemia, unspecified: Secondary | ICD-10-CM | POA: Diagnosis not present

## 2016-03-03 DIAGNOSIS — Z992 Dependence on renal dialysis: Secondary | ICD-10-CM | POA: Diagnosis not present

## 2016-03-03 DIAGNOSIS — Z23 Encounter for immunization: Secondary | ICD-10-CM | POA: Diagnosis not present

## 2016-03-03 DIAGNOSIS — N186 End stage renal disease: Secondary | ICD-10-CM | POA: Diagnosis not present

## 2016-03-04 DIAGNOSIS — D509 Iron deficiency anemia, unspecified: Secondary | ICD-10-CM | POA: Diagnosis not present

## 2016-03-04 DIAGNOSIS — Z23 Encounter for immunization: Secondary | ICD-10-CM | POA: Diagnosis not present

## 2016-03-04 DIAGNOSIS — Z992 Dependence on renal dialysis: Secondary | ICD-10-CM | POA: Diagnosis not present

## 2016-03-04 DIAGNOSIS — N186 End stage renal disease: Secondary | ICD-10-CM | POA: Diagnosis not present

## 2016-03-04 DIAGNOSIS — D631 Anemia in chronic kidney disease: Secondary | ICD-10-CM | POA: Diagnosis not present

## 2016-03-05 DIAGNOSIS — Z23 Encounter for immunization: Secondary | ICD-10-CM | POA: Diagnosis not present

## 2016-03-05 DIAGNOSIS — N186 End stage renal disease: Secondary | ICD-10-CM | POA: Diagnosis not present

## 2016-03-05 DIAGNOSIS — Z992 Dependence on renal dialysis: Secondary | ICD-10-CM | POA: Diagnosis not present

## 2016-03-05 DIAGNOSIS — D509 Iron deficiency anemia, unspecified: Secondary | ICD-10-CM | POA: Diagnosis not present

## 2016-03-05 DIAGNOSIS — D631 Anemia in chronic kidney disease: Secondary | ICD-10-CM | POA: Diagnosis not present

## 2016-03-06 DIAGNOSIS — Z23 Encounter for immunization: Secondary | ICD-10-CM | POA: Diagnosis not present

## 2016-03-06 DIAGNOSIS — N186 End stage renal disease: Secondary | ICD-10-CM | POA: Diagnosis not present

## 2016-03-06 DIAGNOSIS — D509 Iron deficiency anemia, unspecified: Secondary | ICD-10-CM | POA: Diagnosis not present

## 2016-03-06 DIAGNOSIS — D631 Anemia in chronic kidney disease: Secondary | ICD-10-CM | POA: Diagnosis not present

## 2016-03-06 DIAGNOSIS — Z992 Dependence on renal dialysis: Secondary | ICD-10-CM | POA: Diagnosis not present

## 2016-03-07 DIAGNOSIS — Z992 Dependence on renal dialysis: Secondary | ICD-10-CM | POA: Diagnosis not present

## 2016-03-07 DIAGNOSIS — Z23 Encounter for immunization: Secondary | ICD-10-CM | POA: Diagnosis not present

## 2016-03-07 DIAGNOSIS — N186 End stage renal disease: Secondary | ICD-10-CM | POA: Diagnosis not present

## 2016-03-07 DIAGNOSIS — D509 Iron deficiency anemia, unspecified: Secondary | ICD-10-CM | POA: Diagnosis not present

## 2016-03-07 DIAGNOSIS — D631 Anemia in chronic kidney disease: Secondary | ICD-10-CM | POA: Diagnosis not present

## 2016-03-08 DIAGNOSIS — D509 Iron deficiency anemia, unspecified: Secondary | ICD-10-CM | POA: Diagnosis not present

## 2016-03-08 DIAGNOSIS — Z23 Encounter for immunization: Secondary | ICD-10-CM | POA: Diagnosis not present

## 2016-03-08 DIAGNOSIS — Z992 Dependence on renal dialysis: Secondary | ICD-10-CM | POA: Diagnosis not present

## 2016-03-08 DIAGNOSIS — D631 Anemia in chronic kidney disease: Secondary | ICD-10-CM | POA: Diagnosis not present

## 2016-03-08 DIAGNOSIS — N186 End stage renal disease: Secondary | ICD-10-CM | POA: Diagnosis not present

## 2016-03-09 DIAGNOSIS — Z992 Dependence on renal dialysis: Secondary | ICD-10-CM | POA: Diagnosis not present

## 2016-03-09 DIAGNOSIS — N186 End stage renal disease: Secondary | ICD-10-CM | POA: Diagnosis not present

## 2016-03-09 DIAGNOSIS — D509 Iron deficiency anemia, unspecified: Secondary | ICD-10-CM | POA: Diagnosis not present

## 2016-03-09 DIAGNOSIS — D631 Anemia in chronic kidney disease: Secondary | ICD-10-CM | POA: Diagnosis not present

## 2016-03-09 DIAGNOSIS — Z23 Encounter for immunization: Secondary | ICD-10-CM | POA: Diagnosis not present

## 2016-03-10 DIAGNOSIS — D631 Anemia in chronic kidney disease: Secondary | ICD-10-CM | POA: Diagnosis not present

## 2016-03-10 DIAGNOSIS — D509 Iron deficiency anemia, unspecified: Secondary | ICD-10-CM | POA: Diagnosis not present

## 2016-03-10 DIAGNOSIS — Z992 Dependence on renal dialysis: Secondary | ICD-10-CM | POA: Diagnosis not present

## 2016-03-10 DIAGNOSIS — Z23 Encounter for immunization: Secondary | ICD-10-CM | POA: Diagnosis not present

## 2016-03-10 DIAGNOSIS — N186 End stage renal disease: Secondary | ICD-10-CM | POA: Diagnosis not present

## 2016-03-11 DIAGNOSIS — N186 End stage renal disease: Secondary | ICD-10-CM | POA: Diagnosis not present

## 2016-03-11 DIAGNOSIS — D631 Anemia in chronic kidney disease: Secondary | ICD-10-CM | POA: Diagnosis not present

## 2016-03-11 DIAGNOSIS — D509 Iron deficiency anemia, unspecified: Secondary | ICD-10-CM | POA: Diagnosis not present

## 2016-03-11 DIAGNOSIS — Z23 Encounter for immunization: Secondary | ICD-10-CM | POA: Diagnosis not present

## 2016-03-11 DIAGNOSIS — Z992 Dependence on renal dialysis: Secondary | ICD-10-CM | POA: Diagnosis not present

## 2016-03-12 DIAGNOSIS — D631 Anemia in chronic kidney disease: Secondary | ICD-10-CM | POA: Diagnosis not present

## 2016-03-12 DIAGNOSIS — N186 End stage renal disease: Secondary | ICD-10-CM | POA: Diagnosis not present

## 2016-03-12 DIAGNOSIS — D509 Iron deficiency anemia, unspecified: Secondary | ICD-10-CM | POA: Diagnosis not present

## 2016-03-12 DIAGNOSIS — Z992 Dependence on renal dialysis: Secondary | ICD-10-CM | POA: Diagnosis not present

## 2016-03-12 DIAGNOSIS — Z23 Encounter for immunization: Secondary | ICD-10-CM | POA: Diagnosis not present

## 2016-03-13 DIAGNOSIS — N186 End stage renal disease: Secondary | ICD-10-CM | POA: Diagnosis not present

## 2016-03-13 DIAGNOSIS — Z23 Encounter for immunization: Secondary | ICD-10-CM | POA: Diagnosis not present

## 2016-03-13 DIAGNOSIS — Z992 Dependence on renal dialysis: Secondary | ICD-10-CM | POA: Diagnosis not present

## 2016-03-13 DIAGNOSIS — D509 Iron deficiency anemia, unspecified: Secondary | ICD-10-CM | POA: Diagnosis not present

## 2016-03-13 DIAGNOSIS — D631 Anemia in chronic kidney disease: Secondary | ICD-10-CM | POA: Diagnosis not present

## 2016-03-14 DIAGNOSIS — D631 Anemia in chronic kidney disease: Secondary | ICD-10-CM | POA: Diagnosis not present

## 2016-03-14 DIAGNOSIS — D509 Iron deficiency anemia, unspecified: Secondary | ICD-10-CM | POA: Diagnosis not present

## 2016-03-14 DIAGNOSIS — N186 End stage renal disease: Secondary | ICD-10-CM | POA: Diagnosis not present

## 2016-03-14 DIAGNOSIS — Z992 Dependence on renal dialysis: Secondary | ICD-10-CM | POA: Diagnosis not present

## 2016-03-14 DIAGNOSIS — N2581 Secondary hyperparathyroidism of renal origin: Secondary | ICD-10-CM | POA: Diagnosis not present

## 2016-03-15 DIAGNOSIS — N186 End stage renal disease: Secondary | ICD-10-CM | POA: Diagnosis not present

## 2016-03-15 DIAGNOSIS — D509 Iron deficiency anemia, unspecified: Secondary | ICD-10-CM | POA: Diagnosis not present

## 2016-03-15 DIAGNOSIS — D631 Anemia in chronic kidney disease: Secondary | ICD-10-CM | POA: Diagnosis not present

## 2016-03-15 DIAGNOSIS — N2581 Secondary hyperparathyroidism of renal origin: Secondary | ICD-10-CM | POA: Diagnosis not present

## 2016-03-15 DIAGNOSIS — Z992 Dependence on renal dialysis: Secondary | ICD-10-CM | POA: Diagnosis not present

## 2016-03-16 DIAGNOSIS — Z992 Dependence on renal dialysis: Secondary | ICD-10-CM | POA: Diagnosis not present

## 2016-03-16 DIAGNOSIS — D631 Anemia in chronic kidney disease: Secondary | ICD-10-CM | POA: Diagnosis not present

## 2016-03-16 DIAGNOSIS — N186 End stage renal disease: Secondary | ICD-10-CM | POA: Diagnosis not present

## 2016-03-16 DIAGNOSIS — N2581 Secondary hyperparathyroidism of renal origin: Secondary | ICD-10-CM | POA: Diagnosis not present

## 2016-03-16 DIAGNOSIS — D509 Iron deficiency anemia, unspecified: Secondary | ICD-10-CM | POA: Diagnosis not present

## 2016-03-17 DIAGNOSIS — D509 Iron deficiency anemia, unspecified: Secondary | ICD-10-CM | POA: Diagnosis not present

## 2016-03-17 DIAGNOSIS — N2581 Secondary hyperparathyroidism of renal origin: Secondary | ICD-10-CM | POA: Diagnosis not present

## 2016-03-17 DIAGNOSIS — N186 End stage renal disease: Secondary | ICD-10-CM | POA: Diagnosis not present

## 2016-03-17 DIAGNOSIS — Z992 Dependence on renal dialysis: Secondary | ICD-10-CM | POA: Diagnosis not present

## 2016-03-17 DIAGNOSIS — D631 Anemia in chronic kidney disease: Secondary | ICD-10-CM | POA: Diagnosis not present

## 2016-03-18 DIAGNOSIS — N2581 Secondary hyperparathyroidism of renal origin: Secondary | ICD-10-CM | POA: Diagnosis not present

## 2016-03-18 DIAGNOSIS — D631 Anemia in chronic kidney disease: Secondary | ICD-10-CM | POA: Diagnosis not present

## 2016-03-18 DIAGNOSIS — Z992 Dependence on renal dialysis: Secondary | ICD-10-CM | POA: Diagnosis not present

## 2016-03-18 DIAGNOSIS — D509 Iron deficiency anemia, unspecified: Secondary | ICD-10-CM | POA: Diagnosis not present

## 2016-03-18 DIAGNOSIS — N186 End stage renal disease: Secondary | ICD-10-CM | POA: Diagnosis not present

## 2016-03-19 DIAGNOSIS — Z992 Dependence on renal dialysis: Secondary | ICD-10-CM | POA: Diagnosis not present

## 2016-03-19 DIAGNOSIS — D631 Anemia in chronic kidney disease: Secondary | ICD-10-CM | POA: Diagnosis not present

## 2016-03-19 DIAGNOSIS — D509 Iron deficiency anemia, unspecified: Secondary | ICD-10-CM | POA: Diagnosis not present

## 2016-03-19 DIAGNOSIS — N186 End stage renal disease: Secondary | ICD-10-CM | POA: Diagnosis not present

## 2016-03-19 DIAGNOSIS — N2581 Secondary hyperparathyroidism of renal origin: Secondary | ICD-10-CM | POA: Diagnosis not present

## 2016-03-20 DIAGNOSIS — D631 Anemia in chronic kidney disease: Secondary | ICD-10-CM | POA: Diagnosis not present

## 2016-03-20 DIAGNOSIS — N186 End stage renal disease: Secondary | ICD-10-CM | POA: Diagnosis not present

## 2016-03-20 DIAGNOSIS — N2581 Secondary hyperparathyroidism of renal origin: Secondary | ICD-10-CM | POA: Diagnosis not present

## 2016-03-20 DIAGNOSIS — D509 Iron deficiency anemia, unspecified: Secondary | ICD-10-CM | POA: Diagnosis not present

## 2016-03-20 DIAGNOSIS — Z992 Dependence on renal dialysis: Secondary | ICD-10-CM | POA: Diagnosis not present

## 2016-03-21 DIAGNOSIS — D631 Anemia in chronic kidney disease: Secondary | ICD-10-CM | POA: Diagnosis not present

## 2016-03-21 DIAGNOSIS — D509 Iron deficiency anemia, unspecified: Secondary | ICD-10-CM | POA: Diagnosis not present

## 2016-03-21 DIAGNOSIS — N186 End stage renal disease: Secondary | ICD-10-CM | POA: Diagnosis not present

## 2016-03-21 DIAGNOSIS — N2581 Secondary hyperparathyroidism of renal origin: Secondary | ICD-10-CM | POA: Diagnosis not present

## 2016-03-21 DIAGNOSIS — Z992 Dependence on renal dialysis: Secondary | ICD-10-CM | POA: Diagnosis not present

## 2016-03-22 DIAGNOSIS — D631 Anemia in chronic kidney disease: Secondary | ICD-10-CM | POA: Diagnosis not present

## 2016-03-22 DIAGNOSIS — Z992 Dependence on renal dialysis: Secondary | ICD-10-CM | POA: Diagnosis not present

## 2016-03-22 DIAGNOSIS — N2581 Secondary hyperparathyroidism of renal origin: Secondary | ICD-10-CM | POA: Diagnosis not present

## 2016-03-22 DIAGNOSIS — N186 End stage renal disease: Secondary | ICD-10-CM | POA: Diagnosis not present

## 2016-03-22 DIAGNOSIS — D509 Iron deficiency anemia, unspecified: Secondary | ICD-10-CM | POA: Diagnosis not present

## 2016-03-23 DIAGNOSIS — D631 Anemia in chronic kidney disease: Secondary | ICD-10-CM | POA: Diagnosis not present

## 2016-03-23 DIAGNOSIS — D509 Iron deficiency anemia, unspecified: Secondary | ICD-10-CM | POA: Diagnosis not present

## 2016-03-23 DIAGNOSIS — N2581 Secondary hyperparathyroidism of renal origin: Secondary | ICD-10-CM | POA: Diagnosis not present

## 2016-03-23 DIAGNOSIS — N186 End stage renal disease: Secondary | ICD-10-CM | POA: Diagnosis not present

## 2016-03-23 DIAGNOSIS — Z992 Dependence on renal dialysis: Secondary | ICD-10-CM | POA: Diagnosis not present

## 2016-03-24 DIAGNOSIS — D509 Iron deficiency anemia, unspecified: Secondary | ICD-10-CM | POA: Diagnosis not present

## 2016-03-24 DIAGNOSIS — D631 Anemia in chronic kidney disease: Secondary | ICD-10-CM | POA: Diagnosis not present

## 2016-03-24 DIAGNOSIS — N186 End stage renal disease: Secondary | ICD-10-CM | POA: Diagnosis not present

## 2016-03-24 DIAGNOSIS — Z992 Dependence on renal dialysis: Secondary | ICD-10-CM | POA: Diagnosis not present

## 2016-03-24 DIAGNOSIS — N2581 Secondary hyperparathyroidism of renal origin: Secondary | ICD-10-CM | POA: Diagnosis not present

## 2016-03-25 DIAGNOSIS — Z992 Dependence on renal dialysis: Secondary | ICD-10-CM | POA: Diagnosis not present

## 2016-03-25 DIAGNOSIS — N186 End stage renal disease: Secondary | ICD-10-CM | POA: Diagnosis not present

## 2016-03-25 DIAGNOSIS — N2581 Secondary hyperparathyroidism of renal origin: Secondary | ICD-10-CM | POA: Diagnosis not present

## 2016-03-25 DIAGNOSIS — D631 Anemia in chronic kidney disease: Secondary | ICD-10-CM | POA: Diagnosis not present

## 2016-03-25 DIAGNOSIS — D509 Iron deficiency anemia, unspecified: Secondary | ICD-10-CM | POA: Diagnosis not present

## 2016-03-26 DIAGNOSIS — Z992 Dependence on renal dialysis: Secondary | ICD-10-CM | POA: Diagnosis not present

## 2016-03-26 DIAGNOSIS — N2581 Secondary hyperparathyroidism of renal origin: Secondary | ICD-10-CM | POA: Diagnosis not present

## 2016-03-26 DIAGNOSIS — N186 End stage renal disease: Secondary | ICD-10-CM | POA: Diagnosis not present

## 2016-03-26 DIAGNOSIS — D509 Iron deficiency anemia, unspecified: Secondary | ICD-10-CM | POA: Diagnosis not present

## 2016-03-26 DIAGNOSIS — D631 Anemia in chronic kidney disease: Secondary | ICD-10-CM | POA: Diagnosis not present

## 2016-03-27 DIAGNOSIS — N2581 Secondary hyperparathyroidism of renal origin: Secondary | ICD-10-CM | POA: Diagnosis not present

## 2016-03-27 DIAGNOSIS — N186 End stage renal disease: Secondary | ICD-10-CM | POA: Diagnosis not present

## 2016-03-27 DIAGNOSIS — D509 Iron deficiency anemia, unspecified: Secondary | ICD-10-CM | POA: Diagnosis not present

## 2016-03-27 DIAGNOSIS — D631 Anemia in chronic kidney disease: Secondary | ICD-10-CM | POA: Diagnosis not present

## 2016-03-27 DIAGNOSIS — Z992 Dependence on renal dialysis: Secondary | ICD-10-CM | POA: Diagnosis not present

## 2016-03-28 DIAGNOSIS — Z992 Dependence on renal dialysis: Secondary | ICD-10-CM | POA: Diagnosis not present

## 2016-03-28 DIAGNOSIS — D631 Anemia in chronic kidney disease: Secondary | ICD-10-CM | POA: Diagnosis not present

## 2016-03-28 DIAGNOSIS — N2581 Secondary hyperparathyroidism of renal origin: Secondary | ICD-10-CM | POA: Diagnosis not present

## 2016-03-28 DIAGNOSIS — D509 Iron deficiency anemia, unspecified: Secondary | ICD-10-CM | POA: Diagnosis not present

## 2016-03-28 DIAGNOSIS — N186 End stage renal disease: Secondary | ICD-10-CM | POA: Diagnosis not present

## 2016-03-29 DIAGNOSIS — Z992 Dependence on renal dialysis: Secondary | ICD-10-CM | POA: Diagnosis not present

## 2016-03-29 DIAGNOSIS — D509 Iron deficiency anemia, unspecified: Secondary | ICD-10-CM | POA: Diagnosis not present

## 2016-03-29 DIAGNOSIS — N2581 Secondary hyperparathyroidism of renal origin: Secondary | ICD-10-CM | POA: Diagnosis not present

## 2016-03-29 DIAGNOSIS — N186 End stage renal disease: Secondary | ICD-10-CM | POA: Diagnosis not present

## 2016-03-29 DIAGNOSIS — D631 Anemia in chronic kidney disease: Secondary | ICD-10-CM | POA: Diagnosis not present

## 2016-03-30 DIAGNOSIS — Z992 Dependence on renal dialysis: Secondary | ICD-10-CM | POA: Diagnosis not present

## 2016-03-30 DIAGNOSIS — N186 End stage renal disease: Secondary | ICD-10-CM | POA: Diagnosis not present

## 2016-03-30 DIAGNOSIS — N2581 Secondary hyperparathyroidism of renal origin: Secondary | ICD-10-CM | POA: Diagnosis not present

## 2016-03-30 DIAGNOSIS — D631 Anemia in chronic kidney disease: Secondary | ICD-10-CM | POA: Diagnosis not present

## 2016-03-30 DIAGNOSIS — D509 Iron deficiency anemia, unspecified: Secondary | ICD-10-CM | POA: Diagnosis not present

## 2016-03-31 DIAGNOSIS — Z992 Dependence on renal dialysis: Secondary | ICD-10-CM | POA: Diagnosis not present

## 2016-03-31 DIAGNOSIS — N186 End stage renal disease: Secondary | ICD-10-CM | POA: Diagnosis not present

## 2016-03-31 DIAGNOSIS — N2581 Secondary hyperparathyroidism of renal origin: Secondary | ICD-10-CM | POA: Diagnosis not present

## 2016-03-31 DIAGNOSIS — D509 Iron deficiency anemia, unspecified: Secondary | ICD-10-CM | POA: Diagnosis not present

## 2016-03-31 DIAGNOSIS — D631 Anemia in chronic kidney disease: Secondary | ICD-10-CM | POA: Diagnosis not present

## 2016-04-01 DIAGNOSIS — D631 Anemia in chronic kidney disease: Secondary | ICD-10-CM | POA: Diagnosis not present

## 2016-04-01 DIAGNOSIS — Z992 Dependence on renal dialysis: Secondary | ICD-10-CM | POA: Diagnosis not present

## 2016-04-01 DIAGNOSIS — N186 End stage renal disease: Secondary | ICD-10-CM | POA: Diagnosis not present

## 2016-04-01 DIAGNOSIS — N2581 Secondary hyperparathyroidism of renal origin: Secondary | ICD-10-CM | POA: Diagnosis not present

## 2016-04-01 DIAGNOSIS — D509 Iron deficiency anemia, unspecified: Secondary | ICD-10-CM | POA: Diagnosis not present

## 2016-04-02 DIAGNOSIS — Z992 Dependence on renal dialysis: Secondary | ICD-10-CM | POA: Diagnosis not present

## 2016-04-02 DIAGNOSIS — D509 Iron deficiency anemia, unspecified: Secondary | ICD-10-CM | POA: Diagnosis not present

## 2016-04-02 DIAGNOSIS — N2581 Secondary hyperparathyroidism of renal origin: Secondary | ICD-10-CM | POA: Diagnosis not present

## 2016-04-02 DIAGNOSIS — D631 Anemia in chronic kidney disease: Secondary | ICD-10-CM | POA: Diagnosis not present

## 2016-04-02 DIAGNOSIS — N186 End stage renal disease: Secondary | ICD-10-CM | POA: Diagnosis not present

## 2016-04-03 DIAGNOSIS — Z992 Dependence on renal dialysis: Secondary | ICD-10-CM | POA: Diagnosis not present

## 2016-04-03 DIAGNOSIS — D631 Anemia in chronic kidney disease: Secondary | ICD-10-CM | POA: Diagnosis not present

## 2016-04-03 DIAGNOSIS — N186 End stage renal disease: Secondary | ICD-10-CM | POA: Diagnosis not present

## 2016-04-03 DIAGNOSIS — N2581 Secondary hyperparathyroidism of renal origin: Secondary | ICD-10-CM | POA: Diagnosis not present

## 2016-04-03 DIAGNOSIS — D509 Iron deficiency anemia, unspecified: Secondary | ICD-10-CM | POA: Diagnosis not present

## 2016-04-04 DIAGNOSIS — Z992 Dependence on renal dialysis: Secondary | ICD-10-CM | POA: Diagnosis not present

## 2016-04-04 DIAGNOSIS — N2581 Secondary hyperparathyroidism of renal origin: Secondary | ICD-10-CM | POA: Diagnosis not present

## 2016-04-04 DIAGNOSIS — N186 End stage renal disease: Secondary | ICD-10-CM | POA: Diagnosis not present

## 2016-04-04 DIAGNOSIS — D631 Anemia in chronic kidney disease: Secondary | ICD-10-CM | POA: Diagnosis not present

## 2016-04-04 DIAGNOSIS — D509 Iron deficiency anemia, unspecified: Secondary | ICD-10-CM | POA: Diagnosis not present

## 2016-04-05 DIAGNOSIS — Z992 Dependence on renal dialysis: Secondary | ICD-10-CM | POA: Diagnosis not present

## 2016-04-05 DIAGNOSIS — N2581 Secondary hyperparathyroidism of renal origin: Secondary | ICD-10-CM | POA: Diagnosis not present

## 2016-04-05 DIAGNOSIS — N186 End stage renal disease: Secondary | ICD-10-CM | POA: Diagnosis not present

## 2016-04-05 DIAGNOSIS — D631 Anemia in chronic kidney disease: Secondary | ICD-10-CM | POA: Diagnosis not present

## 2016-04-05 DIAGNOSIS — D509 Iron deficiency anemia, unspecified: Secondary | ICD-10-CM | POA: Diagnosis not present

## 2016-04-06 DIAGNOSIS — N2581 Secondary hyperparathyroidism of renal origin: Secondary | ICD-10-CM | POA: Diagnosis not present

## 2016-04-06 DIAGNOSIS — Z992 Dependence on renal dialysis: Secondary | ICD-10-CM | POA: Diagnosis not present

## 2016-04-06 DIAGNOSIS — N186 End stage renal disease: Secondary | ICD-10-CM | POA: Diagnosis not present

## 2016-04-06 DIAGNOSIS — D509 Iron deficiency anemia, unspecified: Secondary | ICD-10-CM | POA: Diagnosis not present

## 2016-04-06 DIAGNOSIS — D631 Anemia in chronic kidney disease: Secondary | ICD-10-CM | POA: Diagnosis not present

## 2016-04-07 DIAGNOSIS — Z992 Dependence on renal dialysis: Secondary | ICD-10-CM | POA: Diagnosis not present

## 2016-04-07 DIAGNOSIS — N2581 Secondary hyperparathyroidism of renal origin: Secondary | ICD-10-CM | POA: Diagnosis not present

## 2016-04-07 DIAGNOSIS — D509 Iron deficiency anemia, unspecified: Secondary | ICD-10-CM | POA: Diagnosis not present

## 2016-04-07 DIAGNOSIS — D631 Anemia in chronic kidney disease: Secondary | ICD-10-CM | POA: Diagnosis not present

## 2016-04-07 DIAGNOSIS — N186 End stage renal disease: Secondary | ICD-10-CM | POA: Diagnosis not present

## 2016-04-08 DIAGNOSIS — N2581 Secondary hyperparathyroidism of renal origin: Secondary | ICD-10-CM | POA: Diagnosis not present

## 2016-04-08 DIAGNOSIS — D631 Anemia in chronic kidney disease: Secondary | ICD-10-CM | POA: Diagnosis not present

## 2016-04-08 DIAGNOSIS — D509 Iron deficiency anemia, unspecified: Secondary | ICD-10-CM | POA: Diagnosis not present

## 2016-04-08 DIAGNOSIS — N186 End stage renal disease: Secondary | ICD-10-CM | POA: Diagnosis not present

## 2016-04-08 DIAGNOSIS — Z992 Dependence on renal dialysis: Secondary | ICD-10-CM | POA: Diagnosis not present

## 2016-04-09 DIAGNOSIS — Z992 Dependence on renal dialysis: Secondary | ICD-10-CM | POA: Diagnosis not present

## 2016-04-09 DIAGNOSIS — D631 Anemia in chronic kidney disease: Secondary | ICD-10-CM | POA: Diagnosis not present

## 2016-04-09 DIAGNOSIS — N186 End stage renal disease: Secondary | ICD-10-CM | POA: Diagnosis not present

## 2016-04-09 DIAGNOSIS — D509 Iron deficiency anemia, unspecified: Secondary | ICD-10-CM | POA: Diagnosis not present

## 2016-04-09 DIAGNOSIS — N2581 Secondary hyperparathyroidism of renal origin: Secondary | ICD-10-CM | POA: Diagnosis not present

## 2016-04-10 DIAGNOSIS — D509 Iron deficiency anemia, unspecified: Secondary | ICD-10-CM | POA: Diagnosis not present

## 2016-04-10 DIAGNOSIS — N186 End stage renal disease: Secondary | ICD-10-CM | POA: Diagnosis not present

## 2016-04-10 DIAGNOSIS — N2581 Secondary hyperparathyroidism of renal origin: Secondary | ICD-10-CM | POA: Diagnosis not present

## 2016-04-10 DIAGNOSIS — Z992 Dependence on renal dialysis: Secondary | ICD-10-CM | POA: Diagnosis not present

## 2016-04-10 DIAGNOSIS — D631 Anemia in chronic kidney disease: Secondary | ICD-10-CM | POA: Diagnosis not present

## 2016-04-11 DIAGNOSIS — Z992 Dependence on renal dialysis: Secondary | ICD-10-CM | POA: Diagnosis not present

## 2016-04-11 DIAGNOSIS — D631 Anemia in chronic kidney disease: Secondary | ICD-10-CM | POA: Diagnosis not present

## 2016-04-11 DIAGNOSIS — D509 Iron deficiency anemia, unspecified: Secondary | ICD-10-CM | POA: Diagnosis not present

## 2016-04-11 DIAGNOSIS — N2581 Secondary hyperparathyroidism of renal origin: Secondary | ICD-10-CM | POA: Diagnosis not present

## 2016-04-11 DIAGNOSIS — N186 End stage renal disease: Secondary | ICD-10-CM | POA: Diagnosis not present

## 2016-04-12 DIAGNOSIS — Z992 Dependence on renal dialysis: Secondary | ICD-10-CM | POA: Diagnosis not present

## 2016-04-12 DIAGNOSIS — N186 End stage renal disease: Secondary | ICD-10-CM | POA: Diagnosis not present

## 2016-04-12 DIAGNOSIS — D631 Anemia in chronic kidney disease: Secondary | ICD-10-CM | POA: Diagnosis not present

## 2016-04-12 DIAGNOSIS — N2581 Secondary hyperparathyroidism of renal origin: Secondary | ICD-10-CM | POA: Diagnosis not present

## 2016-04-12 DIAGNOSIS — D509 Iron deficiency anemia, unspecified: Secondary | ICD-10-CM | POA: Diagnosis not present

## 2016-04-13 DIAGNOSIS — N186 End stage renal disease: Secondary | ICD-10-CM | POA: Diagnosis not present

## 2016-04-13 DIAGNOSIS — D631 Anemia in chronic kidney disease: Secondary | ICD-10-CM | POA: Diagnosis not present

## 2016-04-13 DIAGNOSIS — D509 Iron deficiency anemia, unspecified: Secondary | ICD-10-CM | POA: Diagnosis not present

## 2016-04-13 DIAGNOSIS — N2581 Secondary hyperparathyroidism of renal origin: Secondary | ICD-10-CM | POA: Diagnosis not present

## 2016-04-13 DIAGNOSIS — Z992 Dependence on renal dialysis: Secondary | ICD-10-CM | POA: Diagnosis not present

## 2016-04-14 DIAGNOSIS — D631 Anemia in chronic kidney disease: Secondary | ICD-10-CM | POA: Diagnosis not present

## 2016-04-14 DIAGNOSIS — D509 Iron deficiency anemia, unspecified: Secondary | ICD-10-CM | POA: Diagnosis not present

## 2016-04-14 DIAGNOSIS — N186 End stage renal disease: Secondary | ICD-10-CM | POA: Diagnosis not present

## 2016-04-14 DIAGNOSIS — Z992 Dependence on renal dialysis: Secondary | ICD-10-CM | POA: Diagnosis not present

## 2016-04-15 DIAGNOSIS — D631 Anemia in chronic kidney disease: Secondary | ICD-10-CM | POA: Diagnosis not present

## 2016-04-15 DIAGNOSIS — D509 Iron deficiency anemia, unspecified: Secondary | ICD-10-CM | POA: Diagnosis not present

## 2016-04-15 DIAGNOSIS — Z992 Dependence on renal dialysis: Secondary | ICD-10-CM | POA: Diagnosis not present

## 2016-04-15 DIAGNOSIS — N186 End stage renal disease: Secondary | ICD-10-CM | POA: Diagnosis not present

## 2016-04-16 DIAGNOSIS — D631 Anemia in chronic kidney disease: Secondary | ICD-10-CM | POA: Diagnosis not present

## 2016-04-16 DIAGNOSIS — Z992 Dependence on renal dialysis: Secondary | ICD-10-CM | POA: Diagnosis not present

## 2016-04-16 DIAGNOSIS — D509 Iron deficiency anemia, unspecified: Secondary | ICD-10-CM | POA: Diagnosis not present

## 2016-04-16 DIAGNOSIS — N186 End stage renal disease: Secondary | ICD-10-CM | POA: Diagnosis not present

## 2016-04-17 DIAGNOSIS — Z992 Dependence on renal dialysis: Secondary | ICD-10-CM | POA: Diagnosis not present

## 2016-04-17 DIAGNOSIS — D509 Iron deficiency anemia, unspecified: Secondary | ICD-10-CM | POA: Diagnosis not present

## 2016-04-17 DIAGNOSIS — D631 Anemia in chronic kidney disease: Secondary | ICD-10-CM | POA: Diagnosis not present

## 2016-04-17 DIAGNOSIS — N186 End stage renal disease: Secondary | ICD-10-CM | POA: Diagnosis not present

## 2016-04-18 DIAGNOSIS — Z992 Dependence on renal dialysis: Secondary | ICD-10-CM | POA: Diagnosis not present

## 2016-04-18 DIAGNOSIS — N186 End stage renal disease: Secondary | ICD-10-CM | POA: Diagnosis not present

## 2016-04-18 DIAGNOSIS — D631 Anemia in chronic kidney disease: Secondary | ICD-10-CM | POA: Diagnosis not present

## 2016-04-18 DIAGNOSIS — D509 Iron deficiency anemia, unspecified: Secondary | ICD-10-CM | POA: Diagnosis not present

## 2016-04-19 DIAGNOSIS — D631 Anemia in chronic kidney disease: Secondary | ICD-10-CM | POA: Diagnosis not present

## 2016-04-19 DIAGNOSIS — N186 End stage renal disease: Secondary | ICD-10-CM | POA: Diagnosis not present

## 2016-04-19 DIAGNOSIS — Z992 Dependence on renal dialysis: Secondary | ICD-10-CM | POA: Diagnosis not present

## 2016-04-19 DIAGNOSIS — D509 Iron deficiency anemia, unspecified: Secondary | ICD-10-CM | POA: Diagnosis not present

## 2016-04-20 DIAGNOSIS — D509 Iron deficiency anemia, unspecified: Secondary | ICD-10-CM | POA: Diagnosis not present

## 2016-04-20 DIAGNOSIS — N186 End stage renal disease: Secondary | ICD-10-CM | POA: Diagnosis not present

## 2016-04-20 DIAGNOSIS — D631 Anemia in chronic kidney disease: Secondary | ICD-10-CM | POA: Diagnosis not present

## 2016-04-20 DIAGNOSIS — Z992 Dependence on renal dialysis: Secondary | ICD-10-CM | POA: Diagnosis not present

## 2016-04-21 DIAGNOSIS — N186 End stage renal disease: Secondary | ICD-10-CM | POA: Diagnosis not present

## 2016-04-21 DIAGNOSIS — Z992 Dependence on renal dialysis: Secondary | ICD-10-CM | POA: Diagnosis not present

## 2016-04-21 DIAGNOSIS — D509 Iron deficiency anemia, unspecified: Secondary | ICD-10-CM | POA: Diagnosis not present

## 2016-04-21 DIAGNOSIS — D631 Anemia in chronic kidney disease: Secondary | ICD-10-CM | POA: Diagnosis not present

## 2016-04-22 DIAGNOSIS — N186 End stage renal disease: Secondary | ICD-10-CM | POA: Diagnosis not present

## 2016-04-22 DIAGNOSIS — D631 Anemia in chronic kidney disease: Secondary | ICD-10-CM | POA: Diagnosis not present

## 2016-04-22 DIAGNOSIS — D509 Iron deficiency anemia, unspecified: Secondary | ICD-10-CM | POA: Diagnosis not present

## 2016-04-22 DIAGNOSIS — Z992 Dependence on renal dialysis: Secondary | ICD-10-CM | POA: Diagnosis not present

## 2016-04-23 DIAGNOSIS — D509 Iron deficiency anemia, unspecified: Secondary | ICD-10-CM | POA: Diagnosis not present

## 2016-04-23 DIAGNOSIS — Z992 Dependence on renal dialysis: Secondary | ICD-10-CM | POA: Diagnosis not present

## 2016-04-23 DIAGNOSIS — N186 End stage renal disease: Secondary | ICD-10-CM | POA: Diagnosis not present

## 2016-04-23 DIAGNOSIS — D631 Anemia in chronic kidney disease: Secondary | ICD-10-CM | POA: Diagnosis not present

## 2016-04-24 DIAGNOSIS — D509 Iron deficiency anemia, unspecified: Secondary | ICD-10-CM | POA: Diagnosis not present

## 2016-04-24 DIAGNOSIS — N186 End stage renal disease: Secondary | ICD-10-CM | POA: Diagnosis not present

## 2016-04-24 DIAGNOSIS — D631 Anemia in chronic kidney disease: Secondary | ICD-10-CM | POA: Diagnosis not present

## 2016-04-24 DIAGNOSIS — Z992 Dependence on renal dialysis: Secondary | ICD-10-CM | POA: Diagnosis not present

## 2016-04-25 DIAGNOSIS — Z992 Dependence on renal dialysis: Secondary | ICD-10-CM | POA: Diagnosis not present

## 2016-04-25 DIAGNOSIS — D631 Anemia in chronic kidney disease: Secondary | ICD-10-CM | POA: Diagnosis not present

## 2016-04-25 DIAGNOSIS — N186 End stage renal disease: Secondary | ICD-10-CM | POA: Diagnosis not present

## 2016-04-25 DIAGNOSIS — D509 Iron deficiency anemia, unspecified: Secondary | ICD-10-CM | POA: Diagnosis not present

## 2016-04-26 DIAGNOSIS — N186 End stage renal disease: Secondary | ICD-10-CM | POA: Diagnosis not present

## 2016-04-26 DIAGNOSIS — D631 Anemia in chronic kidney disease: Secondary | ICD-10-CM | POA: Diagnosis not present

## 2016-04-26 DIAGNOSIS — D509 Iron deficiency anemia, unspecified: Secondary | ICD-10-CM | POA: Diagnosis not present

## 2016-04-26 DIAGNOSIS — Z992 Dependence on renal dialysis: Secondary | ICD-10-CM | POA: Diagnosis not present

## 2016-04-27 DIAGNOSIS — D631 Anemia in chronic kidney disease: Secondary | ICD-10-CM | POA: Diagnosis not present

## 2016-04-27 DIAGNOSIS — Z992 Dependence on renal dialysis: Secondary | ICD-10-CM | POA: Diagnosis not present

## 2016-04-27 DIAGNOSIS — N186 End stage renal disease: Secondary | ICD-10-CM | POA: Diagnosis not present

## 2016-04-27 DIAGNOSIS — D509 Iron deficiency anemia, unspecified: Secondary | ICD-10-CM | POA: Diagnosis not present

## 2016-04-28 DIAGNOSIS — D509 Iron deficiency anemia, unspecified: Secondary | ICD-10-CM | POA: Diagnosis not present

## 2016-04-28 DIAGNOSIS — D631 Anemia in chronic kidney disease: Secondary | ICD-10-CM | POA: Diagnosis not present

## 2016-04-28 DIAGNOSIS — N186 End stage renal disease: Secondary | ICD-10-CM | POA: Diagnosis not present

## 2016-04-28 DIAGNOSIS — Z992 Dependence on renal dialysis: Secondary | ICD-10-CM | POA: Diagnosis not present

## 2016-04-29 DIAGNOSIS — N186 End stage renal disease: Secondary | ICD-10-CM | POA: Diagnosis not present

## 2016-04-29 DIAGNOSIS — D631 Anemia in chronic kidney disease: Secondary | ICD-10-CM | POA: Diagnosis not present

## 2016-04-29 DIAGNOSIS — D509 Iron deficiency anemia, unspecified: Secondary | ICD-10-CM | POA: Diagnosis not present

## 2016-04-29 DIAGNOSIS — Z992 Dependence on renal dialysis: Secondary | ICD-10-CM | POA: Diagnosis not present

## 2016-04-30 DIAGNOSIS — D631 Anemia in chronic kidney disease: Secondary | ICD-10-CM | POA: Diagnosis not present

## 2016-04-30 DIAGNOSIS — Z992 Dependence on renal dialysis: Secondary | ICD-10-CM | POA: Diagnosis not present

## 2016-04-30 DIAGNOSIS — D509 Iron deficiency anemia, unspecified: Secondary | ICD-10-CM | POA: Diagnosis not present

## 2016-04-30 DIAGNOSIS — N186 End stage renal disease: Secondary | ICD-10-CM | POA: Diagnosis not present

## 2016-05-01 DIAGNOSIS — D631 Anemia in chronic kidney disease: Secondary | ICD-10-CM | POA: Diagnosis not present

## 2016-05-01 DIAGNOSIS — Z992 Dependence on renal dialysis: Secondary | ICD-10-CM | POA: Diagnosis not present

## 2016-05-01 DIAGNOSIS — D509 Iron deficiency anemia, unspecified: Secondary | ICD-10-CM | POA: Diagnosis not present

## 2016-05-01 DIAGNOSIS — N186 End stage renal disease: Secondary | ICD-10-CM | POA: Diagnosis not present

## 2016-05-02 DIAGNOSIS — Z992 Dependence on renal dialysis: Secondary | ICD-10-CM | POA: Diagnosis not present

## 2016-05-02 DIAGNOSIS — D631 Anemia in chronic kidney disease: Secondary | ICD-10-CM | POA: Diagnosis not present

## 2016-05-02 DIAGNOSIS — N186 End stage renal disease: Secondary | ICD-10-CM | POA: Diagnosis not present

## 2016-05-02 DIAGNOSIS — D509 Iron deficiency anemia, unspecified: Secondary | ICD-10-CM | POA: Diagnosis not present

## 2016-05-03 DIAGNOSIS — Z992 Dependence on renal dialysis: Secondary | ICD-10-CM | POA: Diagnosis not present

## 2016-05-03 DIAGNOSIS — D631 Anemia in chronic kidney disease: Secondary | ICD-10-CM | POA: Diagnosis not present

## 2016-05-03 DIAGNOSIS — D509 Iron deficiency anemia, unspecified: Secondary | ICD-10-CM | POA: Diagnosis not present

## 2016-05-03 DIAGNOSIS — N186 End stage renal disease: Secondary | ICD-10-CM | POA: Diagnosis not present

## 2016-05-04 DIAGNOSIS — D631 Anemia in chronic kidney disease: Secondary | ICD-10-CM | POA: Diagnosis not present

## 2016-05-04 DIAGNOSIS — Z992 Dependence on renal dialysis: Secondary | ICD-10-CM | POA: Diagnosis not present

## 2016-05-04 DIAGNOSIS — D509 Iron deficiency anemia, unspecified: Secondary | ICD-10-CM | POA: Diagnosis not present

## 2016-05-04 DIAGNOSIS — N186 End stage renal disease: Secondary | ICD-10-CM | POA: Diagnosis not present

## 2016-05-05 DIAGNOSIS — D631 Anemia in chronic kidney disease: Secondary | ICD-10-CM | POA: Diagnosis not present

## 2016-05-05 DIAGNOSIS — N186 End stage renal disease: Secondary | ICD-10-CM | POA: Diagnosis not present

## 2016-05-05 DIAGNOSIS — D509 Iron deficiency anemia, unspecified: Secondary | ICD-10-CM | POA: Diagnosis not present

## 2016-05-05 DIAGNOSIS — Z992 Dependence on renal dialysis: Secondary | ICD-10-CM | POA: Diagnosis not present

## 2016-05-06 DIAGNOSIS — D631 Anemia in chronic kidney disease: Secondary | ICD-10-CM | POA: Diagnosis not present

## 2016-05-06 DIAGNOSIS — D509 Iron deficiency anemia, unspecified: Secondary | ICD-10-CM | POA: Diagnosis not present

## 2016-05-06 DIAGNOSIS — Z992 Dependence on renal dialysis: Secondary | ICD-10-CM | POA: Diagnosis not present

## 2016-05-06 DIAGNOSIS — N186 End stage renal disease: Secondary | ICD-10-CM | POA: Diagnosis not present

## 2016-05-07 DIAGNOSIS — Z992 Dependence on renal dialysis: Secondary | ICD-10-CM | POA: Diagnosis not present

## 2016-05-07 DIAGNOSIS — D509 Iron deficiency anemia, unspecified: Secondary | ICD-10-CM | POA: Diagnosis not present

## 2016-05-07 DIAGNOSIS — D631 Anemia in chronic kidney disease: Secondary | ICD-10-CM | POA: Diagnosis not present

## 2016-05-07 DIAGNOSIS — N186 End stage renal disease: Secondary | ICD-10-CM | POA: Diagnosis not present

## 2016-05-08 DIAGNOSIS — N186 End stage renal disease: Secondary | ICD-10-CM | POA: Diagnosis not present

## 2016-05-08 DIAGNOSIS — D631 Anemia in chronic kidney disease: Secondary | ICD-10-CM | POA: Diagnosis not present

## 2016-05-08 DIAGNOSIS — D509 Iron deficiency anemia, unspecified: Secondary | ICD-10-CM | POA: Diagnosis not present

## 2016-05-08 DIAGNOSIS — Z992 Dependence on renal dialysis: Secondary | ICD-10-CM | POA: Diagnosis not present

## 2016-05-09 DIAGNOSIS — N186 End stage renal disease: Secondary | ICD-10-CM | POA: Diagnosis not present

## 2016-05-09 DIAGNOSIS — D631 Anemia in chronic kidney disease: Secondary | ICD-10-CM | POA: Diagnosis not present

## 2016-05-09 DIAGNOSIS — Z992 Dependence on renal dialysis: Secondary | ICD-10-CM | POA: Diagnosis not present

## 2016-05-09 DIAGNOSIS — D509 Iron deficiency anemia, unspecified: Secondary | ICD-10-CM | POA: Diagnosis not present

## 2016-05-10 DIAGNOSIS — D509 Iron deficiency anemia, unspecified: Secondary | ICD-10-CM | POA: Diagnosis not present

## 2016-05-10 DIAGNOSIS — N186 End stage renal disease: Secondary | ICD-10-CM | POA: Diagnosis not present

## 2016-05-10 DIAGNOSIS — Z992 Dependence on renal dialysis: Secondary | ICD-10-CM | POA: Diagnosis not present

## 2016-05-10 DIAGNOSIS — D631 Anemia in chronic kidney disease: Secondary | ICD-10-CM | POA: Diagnosis not present

## 2016-05-11 DIAGNOSIS — Z992 Dependence on renal dialysis: Secondary | ICD-10-CM | POA: Diagnosis not present

## 2016-05-11 DIAGNOSIS — D509 Iron deficiency anemia, unspecified: Secondary | ICD-10-CM | POA: Diagnosis not present

## 2016-05-11 DIAGNOSIS — N186 End stage renal disease: Secondary | ICD-10-CM | POA: Diagnosis not present

## 2016-05-11 DIAGNOSIS — D631 Anemia in chronic kidney disease: Secondary | ICD-10-CM | POA: Diagnosis not present

## 2016-05-12 DIAGNOSIS — Z992 Dependence on renal dialysis: Secondary | ICD-10-CM | POA: Diagnosis not present

## 2016-05-12 DIAGNOSIS — N186 End stage renal disease: Secondary | ICD-10-CM | POA: Diagnosis not present

## 2016-05-12 DIAGNOSIS — D509 Iron deficiency anemia, unspecified: Secondary | ICD-10-CM | POA: Diagnosis not present

## 2016-05-12 DIAGNOSIS — D631 Anemia in chronic kidney disease: Secondary | ICD-10-CM | POA: Diagnosis not present

## 2016-05-13 DIAGNOSIS — D631 Anemia in chronic kidney disease: Secondary | ICD-10-CM | POA: Diagnosis not present

## 2016-05-13 DIAGNOSIS — N186 End stage renal disease: Secondary | ICD-10-CM | POA: Diagnosis not present

## 2016-05-13 DIAGNOSIS — Z992 Dependence on renal dialysis: Secondary | ICD-10-CM | POA: Diagnosis not present

## 2016-05-13 DIAGNOSIS — D509 Iron deficiency anemia, unspecified: Secondary | ICD-10-CM | POA: Diagnosis not present

## 2016-05-14 DIAGNOSIS — Z992 Dependence on renal dialysis: Secondary | ICD-10-CM | POA: Diagnosis not present

## 2016-05-14 DIAGNOSIS — D631 Anemia in chronic kidney disease: Secondary | ICD-10-CM | POA: Diagnosis not present

## 2016-05-14 DIAGNOSIS — N186 End stage renal disease: Secondary | ICD-10-CM | POA: Diagnosis not present

## 2016-05-14 DIAGNOSIS — D509 Iron deficiency anemia, unspecified: Secondary | ICD-10-CM | POA: Diagnosis not present

## 2016-05-15 DIAGNOSIS — D509 Iron deficiency anemia, unspecified: Secondary | ICD-10-CM | POA: Diagnosis not present

## 2016-05-15 DIAGNOSIS — Z992 Dependence on renal dialysis: Secondary | ICD-10-CM | POA: Diagnosis not present

## 2016-05-15 DIAGNOSIS — D631 Anemia in chronic kidney disease: Secondary | ICD-10-CM | POA: Diagnosis not present

## 2016-05-15 DIAGNOSIS — N186 End stage renal disease: Secondary | ICD-10-CM | POA: Diagnosis not present

## 2016-05-16 DIAGNOSIS — D509 Iron deficiency anemia, unspecified: Secondary | ICD-10-CM | POA: Diagnosis not present

## 2016-05-16 DIAGNOSIS — Z992 Dependence on renal dialysis: Secondary | ICD-10-CM | POA: Diagnosis not present

## 2016-05-16 DIAGNOSIS — N186 End stage renal disease: Secondary | ICD-10-CM | POA: Diagnosis not present

## 2016-05-16 DIAGNOSIS — D631 Anemia in chronic kidney disease: Secondary | ICD-10-CM | POA: Diagnosis not present

## 2016-05-17 DIAGNOSIS — N186 End stage renal disease: Secondary | ICD-10-CM | POA: Diagnosis not present

## 2016-05-17 DIAGNOSIS — D509 Iron deficiency anemia, unspecified: Secondary | ICD-10-CM | POA: Diagnosis not present

## 2016-05-17 DIAGNOSIS — D631 Anemia in chronic kidney disease: Secondary | ICD-10-CM | POA: Diagnosis not present

## 2016-05-17 DIAGNOSIS — Z992 Dependence on renal dialysis: Secondary | ICD-10-CM | POA: Diagnosis not present

## 2016-05-18 DIAGNOSIS — D631 Anemia in chronic kidney disease: Secondary | ICD-10-CM | POA: Diagnosis not present

## 2016-05-18 DIAGNOSIS — N186 End stage renal disease: Secondary | ICD-10-CM | POA: Diagnosis not present

## 2016-05-18 DIAGNOSIS — D509 Iron deficiency anemia, unspecified: Secondary | ICD-10-CM | POA: Diagnosis not present

## 2016-05-18 DIAGNOSIS — Z992 Dependence on renal dialysis: Secondary | ICD-10-CM | POA: Diagnosis not present

## 2016-05-19 DIAGNOSIS — N186 End stage renal disease: Secondary | ICD-10-CM | POA: Diagnosis not present

## 2016-05-19 DIAGNOSIS — D631 Anemia in chronic kidney disease: Secondary | ICD-10-CM | POA: Diagnosis not present

## 2016-05-19 DIAGNOSIS — Z992 Dependence on renal dialysis: Secondary | ICD-10-CM | POA: Diagnosis not present

## 2016-05-19 DIAGNOSIS — D509 Iron deficiency anemia, unspecified: Secondary | ICD-10-CM | POA: Diagnosis not present

## 2016-05-20 DIAGNOSIS — D509 Iron deficiency anemia, unspecified: Secondary | ICD-10-CM | POA: Diagnosis not present

## 2016-05-20 DIAGNOSIS — D631 Anemia in chronic kidney disease: Secondary | ICD-10-CM | POA: Diagnosis not present

## 2016-05-20 DIAGNOSIS — N186 End stage renal disease: Secondary | ICD-10-CM | POA: Diagnosis not present

## 2016-05-20 DIAGNOSIS — Z992 Dependence on renal dialysis: Secondary | ICD-10-CM | POA: Diagnosis not present

## 2016-05-21 DIAGNOSIS — D509 Iron deficiency anemia, unspecified: Secondary | ICD-10-CM | POA: Diagnosis not present

## 2016-05-21 DIAGNOSIS — N186 End stage renal disease: Secondary | ICD-10-CM | POA: Diagnosis not present

## 2016-05-21 DIAGNOSIS — D631 Anemia in chronic kidney disease: Secondary | ICD-10-CM | POA: Diagnosis not present

## 2016-05-21 DIAGNOSIS — Z992 Dependence on renal dialysis: Secondary | ICD-10-CM | POA: Diagnosis not present

## 2016-05-22 DIAGNOSIS — N186 End stage renal disease: Secondary | ICD-10-CM | POA: Diagnosis not present

## 2016-05-22 DIAGNOSIS — Z992 Dependence on renal dialysis: Secondary | ICD-10-CM | POA: Diagnosis not present

## 2016-05-22 DIAGNOSIS — D631 Anemia in chronic kidney disease: Secondary | ICD-10-CM | POA: Diagnosis not present

## 2016-05-22 DIAGNOSIS — D509 Iron deficiency anemia, unspecified: Secondary | ICD-10-CM | POA: Diagnosis not present

## 2016-05-23 DIAGNOSIS — D509 Iron deficiency anemia, unspecified: Secondary | ICD-10-CM | POA: Diagnosis not present

## 2016-05-23 DIAGNOSIS — D631 Anemia in chronic kidney disease: Secondary | ICD-10-CM | POA: Diagnosis not present

## 2016-05-23 DIAGNOSIS — Z992 Dependence on renal dialysis: Secondary | ICD-10-CM | POA: Diagnosis not present

## 2016-05-23 DIAGNOSIS — N186 End stage renal disease: Secondary | ICD-10-CM | POA: Diagnosis not present

## 2016-05-24 DIAGNOSIS — D509 Iron deficiency anemia, unspecified: Secondary | ICD-10-CM | POA: Diagnosis not present

## 2016-05-24 DIAGNOSIS — D631 Anemia in chronic kidney disease: Secondary | ICD-10-CM | POA: Diagnosis not present

## 2016-05-24 DIAGNOSIS — N186 End stage renal disease: Secondary | ICD-10-CM | POA: Diagnosis not present

## 2016-05-24 DIAGNOSIS — Z992 Dependence on renal dialysis: Secondary | ICD-10-CM | POA: Diagnosis not present

## 2016-05-25 DIAGNOSIS — D509 Iron deficiency anemia, unspecified: Secondary | ICD-10-CM | POA: Diagnosis not present

## 2016-05-25 DIAGNOSIS — Z992 Dependence on renal dialysis: Secondary | ICD-10-CM | POA: Diagnosis not present

## 2016-05-25 DIAGNOSIS — N186 End stage renal disease: Secondary | ICD-10-CM | POA: Diagnosis not present

## 2016-05-25 DIAGNOSIS — D631 Anemia in chronic kidney disease: Secondary | ICD-10-CM | POA: Diagnosis not present

## 2016-05-26 DIAGNOSIS — N186 End stage renal disease: Secondary | ICD-10-CM | POA: Diagnosis not present

## 2016-05-26 DIAGNOSIS — D631 Anemia in chronic kidney disease: Secondary | ICD-10-CM | POA: Diagnosis not present

## 2016-05-26 DIAGNOSIS — Z992 Dependence on renal dialysis: Secondary | ICD-10-CM | POA: Diagnosis not present

## 2016-05-26 DIAGNOSIS — D509 Iron deficiency anemia, unspecified: Secondary | ICD-10-CM | POA: Diagnosis not present

## 2016-05-27 DIAGNOSIS — N186 End stage renal disease: Secondary | ICD-10-CM | POA: Diagnosis not present

## 2016-05-27 DIAGNOSIS — D631 Anemia in chronic kidney disease: Secondary | ICD-10-CM | POA: Diagnosis not present

## 2016-05-27 DIAGNOSIS — D509 Iron deficiency anemia, unspecified: Secondary | ICD-10-CM | POA: Diagnosis not present

## 2016-05-27 DIAGNOSIS — Z992 Dependence on renal dialysis: Secondary | ICD-10-CM | POA: Diagnosis not present

## 2016-05-28 DIAGNOSIS — N186 End stage renal disease: Secondary | ICD-10-CM | POA: Diagnosis not present

## 2016-05-28 DIAGNOSIS — D509 Iron deficiency anemia, unspecified: Secondary | ICD-10-CM | POA: Diagnosis not present

## 2016-05-28 DIAGNOSIS — D631 Anemia in chronic kidney disease: Secondary | ICD-10-CM | POA: Diagnosis not present

## 2016-05-28 DIAGNOSIS — Z992 Dependence on renal dialysis: Secondary | ICD-10-CM | POA: Diagnosis not present

## 2016-05-29 DIAGNOSIS — D631 Anemia in chronic kidney disease: Secondary | ICD-10-CM | POA: Diagnosis not present

## 2016-05-29 DIAGNOSIS — Z992 Dependence on renal dialysis: Secondary | ICD-10-CM | POA: Diagnosis not present

## 2016-05-29 DIAGNOSIS — N186 End stage renal disease: Secondary | ICD-10-CM | POA: Diagnosis not present

## 2016-05-29 DIAGNOSIS — D509 Iron deficiency anemia, unspecified: Secondary | ICD-10-CM | POA: Diagnosis not present

## 2016-05-30 DIAGNOSIS — D631 Anemia in chronic kidney disease: Secondary | ICD-10-CM | POA: Diagnosis not present

## 2016-05-30 DIAGNOSIS — D509 Iron deficiency anemia, unspecified: Secondary | ICD-10-CM | POA: Diagnosis not present

## 2016-05-30 DIAGNOSIS — N186 End stage renal disease: Secondary | ICD-10-CM | POA: Diagnosis not present

## 2016-05-30 DIAGNOSIS — Z992 Dependence on renal dialysis: Secondary | ICD-10-CM | POA: Diagnosis not present

## 2016-05-31 DIAGNOSIS — N186 End stage renal disease: Secondary | ICD-10-CM | POA: Diagnosis not present

## 2016-05-31 DIAGNOSIS — Z992 Dependence on renal dialysis: Secondary | ICD-10-CM | POA: Diagnosis not present

## 2016-05-31 DIAGNOSIS — D509 Iron deficiency anemia, unspecified: Secondary | ICD-10-CM | POA: Diagnosis not present

## 2016-05-31 DIAGNOSIS — D631 Anemia in chronic kidney disease: Secondary | ICD-10-CM | POA: Diagnosis not present

## 2016-06-01 DIAGNOSIS — Z992 Dependence on renal dialysis: Secondary | ICD-10-CM | POA: Diagnosis not present

## 2016-06-01 DIAGNOSIS — N186 End stage renal disease: Secondary | ICD-10-CM | POA: Diagnosis not present

## 2016-06-01 DIAGNOSIS — D509 Iron deficiency anemia, unspecified: Secondary | ICD-10-CM | POA: Diagnosis not present

## 2016-06-01 DIAGNOSIS — D631 Anemia in chronic kidney disease: Secondary | ICD-10-CM | POA: Diagnosis not present

## 2016-06-02 DIAGNOSIS — D509 Iron deficiency anemia, unspecified: Secondary | ICD-10-CM | POA: Diagnosis not present

## 2016-06-02 DIAGNOSIS — D631 Anemia in chronic kidney disease: Secondary | ICD-10-CM | POA: Diagnosis not present

## 2016-06-02 DIAGNOSIS — Z992 Dependence on renal dialysis: Secondary | ICD-10-CM | POA: Diagnosis not present

## 2016-06-02 DIAGNOSIS — N186 End stage renal disease: Secondary | ICD-10-CM | POA: Diagnosis not present

## 2016-06-03 DIAGNOSIS — Z992 Dependence on renal dialysis: Secondary | ICD-10-CM | POA: Diagnosis not present

## 2016-06-03 DIAGNOSIS — D631 Anemia in chronic kidney disease: Secondary | ICD-10-CM | POA: Diagnosis not present

## 2016-06-03 DIAGNOSIS — N186 End stage renal disease: Secondary | ICD-10-CM | POA: Diagnosis not present

## 2016-06-03 DIAGNOSIS — D509 Iron deficiency anemia, unspecified: Secondary | ICD-10-CM | POA: Diagnosis not present

## 2016-06-04 DIAGNOSIS — D631 Anemia in chronic kidney disease: Secondary | ICD-10-CM | POA: Diagnosis not present

## 2016-06-04 DIAGNOSIS — Z992 Dependence on renal dialysis: Secondary | ICD-10-CM | POA: Diagnosis not present

## 2016-06-04 DIAGNOSIS — D509 Iron deficiency anemia, unspecified: Secondary | ICD-10-CM | POA: Diagnosis not present

## 2016-06-04 DIAGNOSIS — N186 End stage renal disease: Secondary | ICD-10-CM | POA: Diagnosis not present

## 2016-06-05 DIAGNOSIS — D509 Iron deficiency anemia, unspecified: Secondary | ICD-10-CM | POA: Diagnosis not present

## 2016-06-05 DIAGNOSIS — N186 End stage renal disease: Secondary | ICD-10-CM | POA: Diagnosis not present

## 2016-06-05 DIAGNOSIS — D631 Anemia in chronic kidney disease: Secondary | ICD-10-CM | POA: Diagnosis not present

## 2016-06-05 DIAGNOSIS — Z992 Dependence on renal dialysis: Secondary | ICD-10-CM | POA: Diagnosis not present

## 2016-06-06 DIAGNOSIS — D509 Iron deficiency anemia, unspecified: Secondary | ICD-10-CM | POA: Diagnosis not present

## 2016-06-06 DIAGNOSIS — Z992 Dependence on renal dialysis: Secondary | ICD-10-CM | POA: Diagnosis not present

## 2016-06-06 DIAGNOSIS — D631 Anemia in chronic kidney disease: Secondary | ICD-10-CM | POA: Diagnosis not present

## 2016-06-06 DIAGNOSIS — N186 End stage renal disease: Secondary | ICD-10-CM | POA: Diagnosis not present

## 2016-06-07 DIAGNOSIS — D509 Iron deficiency anemia, unspecified: Secondary | ICD-10-CM | POA: Diagnosis not present

## 2016-06-07 DIAGNOSIS — D631 Anemia in chronic kidney disease: Secondary | ICD-10-CM | POA: Diagnosis not present

## 2016-06-07 DIAGNOSIS — Z992 Dependence on renal dialysis: Secondary | ICD-10-CM | POA: Diagnosis not present

## 2016-06-07 DIAGNOSIS — N186 End stage renal disease: Secondary | ICD-10-CM | POA: Diagnosis not present

## 2016-06-08 DIAGNOSIS — N186 End stage renal disease: Secondary | ICD-10-CM | POA: Diagnosis not present

## 2016-06-08 DIAGNOSIS — D509 Iron deficiency anemia, unspecified: Secondary | ICD-10-CM | POA: Diagnosis not present

## 2016-06-08 DIAGNOSIS — D631 Anemia in chronic kidney disease: Secondary | ICD-10-CM | POA: Diagnosis not present

## 2016-06-08 DIAGNOSIS — Z992 Dependence on renal dialysis: Secondary | ICD-10-CM | POA: Diagnosis not present

## 2016-06-09 DIAGNOSIS — Z992 Dependence on renal dialysis: Secondary | ICD-10-CM | POA: Diagnosis not present

## 2016-06-09 DIAGNOSIS — N186 End stage renal disease: Secondary | ICD-10-CM | POA: Diagnosis not present

## 2016-06-09 DIAGNOSIS — D631 Anemia in chronic kidney disease: Secondary | ICD-10-CM | POA: Diagnosis not present

## 2016-06-09 DIAGNOSIS — D509 Iron deficiency anemia, unspecified: Secondary | ICD-10-CM | POA: Diagnosis not present

## 2016-06-10 DIAGNOSIS — D509 Iron deficiency anemia, unspecified: Secondary | ICD-10-CM | POA: Diagnosis not present

## 2016-06-10 DIAGNOSIS — N186 End stage renal disease: Secondary | ICD-10-CM | POA: Diagnosis not present

## 2016-06-10 DIAGNOSIS — Z992 Dependence on renal dialysis: Secondary | ICD-10-CM | POA: Diagnosis not present

## 2016-06-10 DIAGNOSIS — D631 Anemia in chronic kidney disease: Secondary | ICD-10-CM | POA: Diagnosis not present

## 2016-06-11 DIAGNOSIS — Z992 Dependence on renal dialysis: Secondary | ICD-10-CM | POA: Diagnosis not present

## 2016-06-11 DIAGNOSIS — D631 Anemia in chronic kidney disease: Secondary | ICD-10-CM | POA: Diagnosis not present

## 2016-06-11 DIAGNOSIS — N186 End stage renal disease: Secondary | ICD-10-CM | POA: Diagnosis not present

## 2016-06-11 DIAGNOSIS — D509 Iron deficiency anemia, unspecified: Secondary | ICD-10-CM | POA: Diagnosis not present

## 2016-06-12 DIAGNOSIS — D631 Anemia in chronic kidney disease: Secondary | ICD-10-CM | POA: Diagnosis not present

## 2016-06-12 DIAGNOSIS — N186 End stage renal disease: Secondary | ICD-10-CM | POA: Diagnosis not present

## 2016-06-12 DIAGNOSIS — Z992 Dependence on renal dialysis: Secondary | ICD-10-CM | POA: Diagnosis not present

## 2016-06-12 DIAGNOSIS — D509 Iron deficiency anemia, unspecified: Secondary | ICD-10-CM | POA: Diagnosis not present

## 2016-06-13 DIAGNOSIS — D509 Iron deficiency anemia, unspecified: Secondary | ICD-10-CM | POA: Diagnosis not present

## 2016-06-13 DIAGNOSIS — Z992 Dependence on renal dialysis: Secondary | ICD-10-CM | POA: Diagnosis not present

## 2016-06-13 DIAGNOSIS — N186 End stage renal disease: Secondary | ICD-10-CM | POA: Diagnosis not present

## 2016-06-13 DIAGNOSIS — D631 Anemia in chronic kidney disease: Secondary | ICD-10-CM | POA: Diagnosis not present

## 2016-06-14 DIAGNOSIS — N186 End stage renal disease: Secondary | ICD-10-CM | POA: Diagnosis not present

## 2016-06-14 DIAGNOSIS — D631 Anemia in chronic kidney disease: Secondary | ICD-10-CM | POA: Diagnosis not present

## 2016-06-14 DIAGNOSIS — Z992 Dependence on renal dialysis: Secondary | ICD-10-CM | POA: Diagnosis not present

## 2016-06-15 DIAGNOSIS — Z992 Dependence on renal dialysis: Secondary | ICD-10-CM | POA: Diagnosis not present

## 2016-06-15 DIAGNOSIS — N186 End stage renal disease: Secondary | ICD-10-CM | POA: Diagnosis not present

## 2016-06-15 DIAGNOSIS — D631 Anemia in chronic kidney disease: Secondary | ICD-10-CM | POA: Diagnosis not present

## 2016-06-16 DIAGNOSIS — N186 End stage renal disease: Secondary | ICD-10-CM | POA: Diagnosis not present

## 2016-06-16 DIAGNOSIS — Z992 Dependence on renal dialysis: Secondary | ICD-10-CM | POA: Diagnosis not present

## 2016-06-16 DIAGNOSIS — D631 Anemia in chronic kidney disease: Secondary | ICD-10-CM | POA: Diagnosis not present

## 2016-06-17 DIAGNOSIS — N186 End stage renal disease: Secondary | ICD-10-CM | POA: Diagnosis not present

## 2016-06-17 DIAGNOSIS — D631 Anemia in chronic kidney disease: Secondary | ICD-10-CM | POA: Diagnosis not present

## 2016-06-17 DIAGNOSIS — Z992 Dependence on renal dialysis: Secondary | ICD-10-CM | POA: Diagnosis not present

## 2016-06-18 DIAGNOSIS — Z992 Dependence on renal dialysis: Secondary | ICD-10-CM | POA: Diagnosis not present

## 2016-06-18 DIAGNOSIS — D631 Anemia in chronic kidney disease: Secondary | ICD-10-CM | POA: Diagnosis not present

## 2016-06-18 DIAGNOSIS — N186 End stage renal disease: Secondary | ICD-10-CM | POA: Diagnosis not present

## 2016-06-19 DIAGNOSIS — N186 End stage renal disease: Secondary | ICD-10-CM | POA: Diagnosis not present

## 2016-06-19 DIAGNOSIS — D631 Anemia in chronic kidney disease: Secondary | ICD-10-CM | POA: Diagnosis not present

## 2016-06-19 DIAGNOSIS — Z992 Dependence on renal dialysis: Secondary | ICD-10-CM | POA: Diagnosis not present

## 2016-06-20 DIAGNOSIS — N186 End stage renal disease: Secondary | ICD-10-CM | POA: Diagnosis not present

## 2016-06-20 DIAGNOSIS — Z992 Dependence on renal dialysis: Secondary | ICD-10-CM | POA: Diagnosis not present

## 2016-06-20 DIAGNOSIS — D631 Anemia in chronic kidney disease: Secondary | ICD-10-CM | POA: Diagnosis not present

## 2016-06-21 DIAGNOSIS — Z992 Dependence on renal dialysis: Secondary | ICD-10-CM | POA: Diagnosis not present

## 2016-06-21 DIAGNOSIS — N186 End stage renal disease: Secondary | ICD-10-CM | POA: Diagnosis not present

## 2016-06-21 DIAGNOSIS — D631 Anemia in chronic kidney disease: Secondary | ICD-10-CM | POA: Diagnosis not present

## 2016-06-22 DIAGNOSIS — D631 Anemia in chronic kidney disease: Secondary | ICD-10-CM | POA: Diagnosis not present

## 2016-06-22 DIAGNOSIS — N186 End stage renal disease: Secondary | ICD-10-CM | POA: Diagnosis not present

## 2016-06-22 DIAGNOSIS — Z992 Dependence on renal dialysis: Secondary | ICD-10-CM | POA: Diagnosis not present

## 2016-06-23 DIAGNOSIS — Z992 Dependence on renal dialysis: Secondary | ICD-10-CM | POA: Diagnosis not present

## 2016-06-23 DIAGNOSIS — N186 End stage renal disease: Secondary | ICD-10-CM | POA: Diagnosis not present

## 2016-06-23 DIAGNOSIS — D631 Anemia in chronic kidney disease: Secondary | ICD-10-CM | POA: Diagnosis not present

## 2016-06-24 DIAGNOSIS — Z992 Dependence on renal dialysis: Secondary | ICD-10-CM | POA: Diagnosis not present

## 2016-06-24 DIAGNOSIS — N186 End stage renal disease: Secondary | ICD-10-CM | POA: Diagnosis not present

## 2016-06-24 DIAGNOSIS — D631 Anemia in chronic kidney disease: Secondary | ICD-10-CM | POA: Diagnosis not present

## 2016-06-25 DIAGNOSIS — N186 End stage renal disease: Secondary | ICD-10-CM | POA: Diagnosis not present

## 2016-06-25 DIAGNOSIS — D631 Anemia in chronic kidney disease: Secondary | ICD-10-CM | POA: Diagnosis not present

## 2016-06-25 DIAGNOSIS — Z992 Dependence on renal dialysis: Secondary | ICD-10-CM | POA: Diagnosis not present

## 2016-06-26 DIAGNOSIS — Z992 Dependence on renal dialysis: Secondary | ICD-10-CM | POA: Diagnosis not present

## 2016-06-26 DIAGNOSIS — D631 Anemia in chronic kidney disease: Secondary | ICD-10-CM | POA: Diagnosis not present

## 2016-06-26 DIAGNOSIS — N186 End stage renal disease: Secondary | ICD-10-CM | POA: Diagnosis not present

## 2016-06-27 DIAGNOSIS — N186 End stage renal disease: Secondary | ICD-10-CM | POA: Diagnosis not present

## 2016-06-27 DIAGNOSIS — Z992 Dependence on renal dialysis: Secondary | ICD-10-CM | POA: Diagnosis not present

## 2016-06-27 DIAGNOSIS — D631 Anemia in chronic kidney disease: Secondary | ICD-10-CM | POA: Diagnosis not present

## 2016-06-28 DIAGNOSIS — Z992 Dependence on renal dialysis: Secondary | ICD-10-CM | POA: Diagnosis not present

## 2016-06-28 DIAGNOSIS — D631 Anemia in chronic kidney disease: Secondary | ICD-10-CM | POA: Diagnosis not present

## 2016-06-28 DIAGNOSIS — N186 End stage renal disease: Secondary | ICD-10-CM | POA: Diagnosis not present

## 2016-06-29 DIAGNOSIS — D631 Anemia in chronic kidney disease: Secondary | ICD-10-CM | POA: Diagnosis not present

## 2016-06-29 DIAGNOSIS — Z992 Dependence on renal dialysis: Secondary | ICD-10-CM | POA: Diagnosis not present

## 2016-06-29 DIAGNOSIS — N186 End stage renal disease: Secondary | ICD-10-CM | POA: Diagnosis not present

## 2016-06-30 DIAGNOSIS — Z992 Dependence on renal dialysis: Secondary | ICD-10-CM | POA: Diagnosis not present

## 2016-06-30 DIAGNOSIS — N186 End stage renal disease: Secondary | ICD-10-CM | POA: Diagnosis not present

## 2016-06-30 DIAGNOSIS — D631 Anemia in chronic kidney disease: Secondary | ICD-10-CM | POA: Diagnosis not present

## 2016-07-01 DIAGNOSIS — N186 End stage renal disease: Secondary | ICD-10-CM | POA: Diagnosis not present

## 2016-07-01 DIAGNOSIS — Z992 Dependence on renal dialysis: Secondary | ICD-10-CM | POA: Diagnosis not present

## 2016-07-01 DIAGNOSIS — D631 Anemia in chronic kidney disease: Secondary | ICD-10-CM | POA: Diagnosis not present

## 2016-07-02 DIAGNOSIS — D631 Anemia in chronic kidney disease: Secondary | ICD-10-CM | POA: Diagnosis not present

## 2016-07-02 DIAGNOSIS — N186 End stage renal disease: Secondary | ICD-10-CM | POA: Diagnosis not present

## 2016-07-02 DIAGNOSIS — Z992 Dependence on renal dialysis: Secondary | ICD-10-CM | POA: Diagnosis not present

## 2016-07-03 DIAGNOSIS — D631 Anemia in chronic kidney disease: Secondary | ICD-10-CM | POA: Diagnosis not present

## 2016-07-03 DIAGNOSIS — Z992 Dependence on renal dialysis: Secondary | ICD-10-CM | POA: Diagnosis not present

## 2016-07-03 DIAGNOSIS — N186 End stage renal disease: Secondary | ICD-10-CM | POA: Diagnosis not present

## 2016-07-04 DIAGNOSIS — N186 End stage renal disease: Secondary | ICD-10-CM | POA: Diagnosis not present

## 2016-07-04 DIAGNOSIS — D631 Anemia in chronic kidney disease: Secondary | ICD-10-CM | POA: Diagnosis not present

## 2016-07-04 DIAGNOSIS — Z992 Dependence on renal dialysis: Secondary | ICD-10-CM | POA: Diagnosis not present

## 2016-07-05 DIAGNOSIS — D631 Anemia in chronic kidney disease: Secondary | ICD-10-CM | POA: Diagnosis not present

## 2016-07-05 DIAGNOSIS — Z992 Dependence on renal dialysis: Secondary | ICD-10-CM | POA: Diagnosis not present

## 2016-07-05 DIAGNOSIS — N186 End stage renal disease: Secondary | ICD-10-CM | POA: Diagnosis not present

## 2016-07-06 DIAGNOSIS — D631 Anemia in chronic kidney disease: Secondary | ICD-10-CM | POA: Diagnosis not present

## 2016-07-06 DIAGNOSIS — N186 End stage renal disease: Secondary | ICD-10-CM | POA: Diagnosis not present

## 2016-07-06 DIAGNOSIS — Z992 Dependence on renal dialysis: Secondary | ICD-10-CM | POA: Diagnosis not present

## 2016-07-07 DIAGNOSIS — D631 Anemia in chronic kidney disease: Secondary | ICD-10-CM | POA: Diagnosis not present

## 2016-07-07 DIAGNOSIS — Z992 Dependence on renal dialysis: Secondary | ICD-10-CM | POA: Diagnosis not present

## 2016-07-07 DIAGNOSIS — N186 End stage renal disease: Secondary | ICD-10-CM | POA: Diagnosis not present

## 2016-07-08 DIAGNOSIS — N186 End stage renal disease: Secondary | ICD-10-CM | POA: Diagnosis not present

## 2016-07-08 DIAGNOSIS — Z992 Dependence on renal dialysis: Secondary | ICD-10-CM | POA: Diagnosis not present

## 2016-07-08 DIAGNOSIS — D631 Anemia in chronic kidney disease: Secondary | ICD-10-CM | POA: Diagnosis not present

## 2016-07-09 DIAGNOSIS — D631 Anemia in chronic kidney disease: Secondary | ICD-10-CM | POA: Diagnosis not present

## 2016-07-09 DIAGNOSIS — N186 End stage renal disease: Secondary | ICD-10-CM | POA: Diagnosis not present

## 2016-07-09 DIAGNOSIS — Z992 Dependence on renal dialysis: Secondary | ICD-10-CM | POA: Diagnosis not present

## 2016-07-10 DIAGNOSIS — N186 End stage renal disease: Secondary | ICD-10-CM | POA: Diagnosis not present

## 2016-07-10 DIAGNOSIS — Z992 Dependence on renal dialysis: Secondary | ICD-10-CM | POA: Diagnosis not present

## 2016-07-10 DIAGNOSIS — D631 Anemia in chronic kidney disease: Secondary | ICD-10-CM | POA: Diagnosis not present

## 2016-07-11 DIAGNOSIS — D631 Anemia in chronic kidney disease: Secondary | ICD-10-CM | POA: Diagnosis not present

## 2016-07-11 DIAGNOSIS — Z992 Dependence on renal dialysis: Secondary | ICD-10-CM | POA: Diagnosis not present

## 2016-07-11 DIAGNOSIS — N186 End stage renal disease: Secondary | ICD-10-CM | POA: Diagnosis not present

## 2016-07-12 DIAGNOSIS — Z992 Dependence on renal dialysis: Secondary | ICD-10-CM | POA: Diagnosis not present

## 2016-07-12 DIAGNOSIS — N186 End stage renal disease: Secondary | ICD-10-CM | POA: Diagnosis not present

## 2016-07-12 DIAGNOSIS — D631 Anemia in chronic kidney disease: Secondary | ICD-10-CM | POA: Diagnosis not present

## 2016-07-13 DIAGNOSIS — N186 End stage renal disease: Secondary | ICD-10-CM | POA: Diagnosis not present

## 2016-07-13 DIAGNOSIS — Z992 Dependence on renal dialysis: Secondary | ICD-10-CM | POA: Diagnosis not present

## 2016-07-13 DIAGNOSIS — D631 Anemia in chronic kidney disease: Secondary | ICD-10-CM | POA: Diagnosis not present

## 2016-07-14 ENCOUNTER — Other Ambulatory Visit: Payer: Self-pay | Admitting: Nephrology

## 2016-07-14 DIAGNOSIS — N186 End stage renal disease: Secondary | ICD-10-CM | POA: Diagnosis not present

## 2016-07-14 DIAGNOSIS — Z992 Dependence on renal dialysis: Secondary | ICD-10-CM | POA: Diagnosis not present

## 2016-07-14 DIAGNOSIS — D631 Anemia in chronic kidney disease: Secondary | ICD-10-CM | POA: Diagnosis not present

## 2016-07-15 ENCOUNTER — Ambulatory Visit
Admission: RE | Admit: 2016-07-15 | Discharge: 2016-07-15 | Disposition: A | Discharge status: Home / Self Care | Payer: Medicare Other | Source: Ambulatory Visit | Attending: Nephrology | Admitting: Nephrology

## 2016-07-15 ENCOUNTER — Other Ambulatory Visit: Payer: Self-pay | Admitting: Nephrology

## 2016-07-15 DIAGNOSIS — I7090 Unspecified atherosclerosis: Secondary | ICD-10-CM | POA: Diagnosis not present

## 2016-07-15 DIAGNOSIS — R2242 Localized swelling, mass and lump, left lower limb: Secondary | ICD-10-CM | POA: Diagnosis not present

## 2016-07-15 DIAGNOSIS — R52 Pain, unspecified: Secondary | ICD-10-CM

## 2016-07-15 DIAGNOSIS — M79606 Pain in leg, unspecified: Secondary | ICD-10-CM | POA: Diagnosis not present

## 2016-07-15 DIAGNOSIS — R2241 Localized swelling, mass and lump, right lower limb: Secondary | ICD-10-CM | POA: Diagnosis not present

## 2016-07-15 DIAGNOSIS — Z992 Dependence on renal dialysis: Secondary | ICD-10-CM | POA: Diagnosis not present

## 2016-07-15 DIAGNOSIS — N186 End stage renal disease: Secondary | ICD-10-CM | POA: Diagnosis not present

## 2016-07-15 DIAGNOSIS — D509 Iron deficiency anemia, unspecified: Secondary | ICD-10-CM | POA: Diagnosis not present

## 2016-07-16 DIAGNOSIS — D509 Iron deficiency anemia, unspecified: Secondary | ICD-10-CM | POA: Diagnosis not present

## 2016-07-16 DIAGNOSIS — N186 End stage renal disease: Secondary | ICD-10-CM | POA: Diagnosis not present

## 2016-07-16 DIAGNOSIS — Z992 Dependence on renal dialysis: Secondary | ICD-10-CM | POA: Diagnosis not present

## 2016-07-17 DIAGNOSIS — Z992 Dependence on renal dialysis: Secondary | ICD-10-CM | POA: Diagnosis not present

## 2016-07-17 DIAGNOSIS — D509 Iron deficiency anemia, unspecified: Secondary | ICD-10-CM | POA: Diagnosis not present

## 2016-07-17 DIAGNOSIS — N186 End stage renal disease: Secondary | ICD-10-CM | POA: Diagnosis not present

## 2016-07-18 DIAGNOSIS — N186 End stage renal disease: Secondary | ICD-10-CM | POA: Diagnosis not present

## 2016-07-18 DIAGNOSIS — D509 Iron deficiency anemia, unspecified: Secondary | ICD-10-CM | POA: Diagnosis not present

## 2016-07-18 DIAGNOSIS — Z992 Dependence on renal dialysis: Secondary | ICD-10-CM | POA: Diagnosis not present

## 2016-07-19 DIAGNOSIS — D509 Iron deficiency anemia, unspecified: Secondary | ICD-10-CM | POA: Diagnosis not present

## 2016-07-19 DIAGNOSIS — N186 End stage renal disease: Secondary | ICD-10-CM | POA: Diagnosis not present

## 2016-07-19 DIAGNOSIS — Z992 Dependence on renal dialysis: Secondary | ICD-10-CM | POA: Diagnosis not present

## 2016-07-20 DIAGNOSIS — N186 End stage renal disease: Secondary | ICD-10-CM | POA: Diagnosis not present

## 2016-07-20 DIAGNOSIS — Z992 Dependence on renal dialysis: Secondary | ICD-10-CM | POA: Diagnosis not present

## 2016-07-20 DIAGNOSIS — D509 Iron deficiency anemia, unspecified: Secondary | ICD-10-CM | POA: Diagnosis not present

## 2016-07-21 DIAGNOSIS — N186 End stage renal disease: Secondary | ICD-10-CM | POA: Diagnosis not present

## 2016-07-21 DIAGNOSIS — Z992 Dependence on renal dialysis: Secondary | ICD-10-CM | POA: Diagnosis not present

## 2016-07-21 DIAGNOSIS — D509 Iron deficiency anemia, unspecified: Secondary | ICD-10-CM | POA: Diagnosis not present

## 2016-07-22 DIAGNOSIS — D509 Iron deficiency anemia, unspecified: Secondary | ICD-10-CM | POA: Diagnosis not present

## 2016-07-22 DIAGNOSIS — Z992 Dependence on renal dialysis: Secondary | ICD-10-CM | POA: Diagnosis not present

## 2016-07-22 DIAGNOSIS — N186 End stage renal disease: Secondary | ICD-10-CM | POA: Diagnosis not present

## 2016-07-23 DIAGNOSIS — Z992 Dependence on renal dialysis: Secondary | ICD-10-CM | POA: Diagnosis not present

## 2016-07-23 DIAGNOSIS — D509 Iron deficiency anemia, unspecified: Secondary | ICD-10-CM | POA: Diagnosis not present

## 2016-07-23 DIAGNOSIS — N186 End stage renal disease: Secondary | ICD-10-CM | POA: Diagnosis not present

## 2016-07-24 DIAGNOSIS — N186 End stage renal disease: Secondary | ICD-10-CM | POA: Diagnosis not present

## 2016-07-24 DIAGNOSIS — Z992 Dependence on renal dialysis: Secondary | ICD-10-CM | POA: Diagnosis not present

## 2016-07-24 DIAGNOSIS — D509 Iron deficiency anemia, unspecified: Secondary | ICD-10-CM | POA: Diagnosis not present

## 2016-07-25 DIAGNOSIS — Z992 Dependence on renal dialysis: Secondary | ICD-10-CM | POA: Diagnosis not present

## 2016-07-25 DIAGNOSIS — D509 Iron deficiency anemia, unspecified: Secondary | ICD-10-CM | POA: Diagnosis not present

## 2016-07-25 DIAGNOSIS — N186 End stage renal disease: Secondary | ICD-10-CM | POA: Diagnosis not present

## 2016-07-26 DIAGNOSIS — N186 End stage renal disease: Secondary | ICD-10-CM | POA: Diagnosis not present

## 2016-07-26 DIAGNOSIS — Z992 Dependence on renal dialysis: Secondary | ICD-10-CM | POA: Diagnosis not present

## 2016-07-26 DIAGNOSIS — D509 Iron deficiency anemia, unspecified: Secondary | ICD-10-CM | POA: Diagnosis not present

## 2016-07-27 DIAGNOSIS — N186 End stage renal disease: Secondary | ICD-10-CM | POA: Diagnosis not present

## 2016-07-27 DIAGNOSIS — D509 Iron deficiency anemia, unspecified: Secondary | ICD-10-CM | POA: Diagnosis not present

## 2016-07-27 DIAGNOSIS — Z992 Dependence on renal dialysis: Secondary | ICD-10-CM | POA: Diagnosis not present

## 2016-07-28 DIAGNOSIS — D509 Iron deficiency anemia, unspecified: Secondary | ICD-10-CM | POA: Diagnosis not present

## 2016-07-28 DIAGNOSIS — N186 End stage renal disease: Secondary | ICD-10-CM | POA: Diagnosis not present

## 2016-07-28 DIAGNOSIS — Z992 Dependence on renal dialysis: Secondary | ICD-10-CM | POA: Diagnosis not present

## 2016-07-29 DIAGNOSIS — Z992 Dependence on renal dialysis: Secondary | ICD-10-CM | POA: Diagnosis not present

## 2016-07-29 DIAGNOSIS — N186 End stage renal disease: Secondary | ICD-10-CM | POA: Diagnosis not present

## 2016-07-30 DIAGNOSIS — N186 End stage renal disease: Secondary | ICD-10-CM | POA: Diagnosis not present

## 2016-07-30 DIAGNOSIS — D631 Anemia in chronic kidney disease: Secondary | ICD-10-CM | POA: Diagnosis not present

## 2016-07-30 DIAGNOSIS — Z992 Dependence on renal dialysis: Secondary | ICD-10-CM | POA: Diagnosis not present

## 2016-07-30 DIAGNOSIS — D509 Iron deficiency anemia, unspecified: Secondary | ICD-10-CM | POA: Diagnosis not present

## 2016-07-31 DIAGNOSIS — Z992 Dependence on renal dialysis: Secondary | ICD-10-CM | POA: Diagnosis not present

## 2016-07-31 DIAGNOSIS — N186 End stage renal disease: Secondary | ICD-10-CM | POA: Diagnosis not present

## 2016-07-31 DIAGNOSIS — D509 Iron deficiency anemia, unspecified: Secondary | ICD-10-CM | POA: Diagnosis not present

## 2016-07-31 DIAGNOSIS — D631 Anemia in chronic kidney disease: Secondary | ICD-10-CM | POA: Diagnosis not present

## 2016-08-01 DIAGNOSIS — N186 End stage renal disease: Secondary | ICD-10-CM | POA: Diagnosis not present

## 2016-08-01 DIAGNOSIS — D631 Anemia in chronic kidney disease: Secondary | ICD-10-CM | POA: Diagnosis not present

## 2016-08-01 DIAGNOSIS — D509 Iron deficiency anemia, unspecified: Secondary | ICD-10-CM | POA: Diagnosis not present

## 2016-08-01 DIAGNOSIS — Z992 Dependence on renal dialysis: Secondary | ICD-10-CM | POA: Diagnosis not present

## 2016-08-02 DIAGNOSIS — Z992 Dependence on renal dialysis: Secondary | ICD-10-CM | POA: Diagnosis not present

## 2016-08-02 DIAGNOSIS — D631 Anemia in chronic kidney disease: Secondary | ICD-10-CM | POA: Diagnosis not present

## 2016-08-02 DIAGNOSIS — D509 Iron deficiency anemia, unspecified: Secondary | ICD-10-CM | POA: Diagnosis not present

## 2016-08-02 DIAGNOSIS — N186 End stage renal disease: Secondary | ICD-10-CM | POA: Diagnosis not present

## 2016-08-03 DIAGNOSIS — D509 Iron deficiency anemia, unspecified: Secondary | ICD-10-CM | POA: Diagnosis not present

## 2016-08-03 DIAGNOSIS — N186 End stage renal disease: Secondary | ICD-10-CM | POA: Diagnosis not present

## 2016-08-03 DIAGNOSIS — Z992 Dependence on renal dialysis: Secondary | ICD-10-CM | POA: Diagnosis not present

## 2016-08-03 DIAGNOSIS — D631 Anemia in chronic kidney disease: Secondary | ICD-10-CM | POA: Diagnosis not present

## 2016-08-04 DIAGNOSIS — D509 Iron deficiency anemia, unspecified: Secondary | ICD-10-CM | POA: Diagnosis not present

## 2016-08-04 DIAGNOSIS — N186 End stage renal disease: Secondary | ICD-10-CM | POA: Diagnosis not present

## 2016-08-04 DIAGNOSIS — Z992 Dependence on renal dialysis: Secondary | ICD-10-CM | POA: Diagnosis not present

## 2016-08-04 DIAGNOSIS — D631 Anemia in chronic kidney disease: Secondary | ICD-10-CM | POA: Diagnosis not present

## 2016-08-05 DIAGNOSIS — Z992 Dependence on renal dialysis: Secondary | ICD-10-CM | POA: Diagnosis not present

## 2016-08-05 DIAGNOSIS — N186 End stage renal disease: Secondary | ICD-10-CM | POA: Diagnosis not present

## 2016-08-05 DIAGNOSIS — D631 Anemia in chronic kidney disease: Secondary | ICD-10-CM | POA: Diagnosis not present

## 2016-08-05 DIAGNOSIS — D509 Iron deficiency anemia, unspecified: Secondary | ICD-10-CM | POA: Diagnosis not present

## 2016-08-06 DIAGNOSIS — D631 Anemia in chronic kidney disease: Secondary | ICD-10-CM | POA: Diagnosis not present

## 2016-08-06 DIAGNOSIS — N186 End stage renal disease: Secondary | ICD-10-CM | POA: Diagnosis not present

## 2016-08-06 DIAGNOSIS — D509 Iron deficiency anemia, unspecified: Secondary | ICD-10-CM | POA: Diagnosis not present

## 2016-08-06 DIAGNOSIS — Z992 Dependence on renal dialysis: Secondary | ICD-10-CM | POA: Diagnosis not present

## 2016-08-07 DIAGNOSIS — Z992 Dependence on renal dialysis: Secondary | ICD-10-CM | POA: Diagnosis not present

## 2016-08-07 DIAGNOSIS — D631 Anemia in chronic kidney disease: Secondary | ICD-10-CM | POA: Diagnosis not present

## 2016-08-07 DIAGNOSIS — D509 Iron deficiency anemia, unspecified: Secondary | ICD-10-CM | POA: Diagnosis not present

## 2016-08-07 DIAGNOSIS — N186 End stage renal disease: Secondary | ICD-10-CM | POA: Diagnosis not present

## 2016-08-08 DIAGNOSIS — D509 Iron deficiency anemia, unspecified: Secondary | ICD-10-CM | POA: Diagnosis not present

## 2016-08-08 DIAGNOSIS — Z992 Dependence on renal dialysis: Secondary | ICD-10-CM | POA: Diagnosis not present

## 2016-08-08 DIAGNOSIS — D631 Anemia in chronic kidney disease: Secondary | ICD-10-CM | POA: Diagnosis not present

## 2016-08-08 DIAGNOSIS — N186 End stage renal disease: Secondary | ICD-10-CM | POA: Diagnosis not present

## 2016-08-09 DIAGNOSIS — D631 Anemia in chronic kidney disease: Secondary | ICD-10-CM | POA: Diagnosis not present

## 2016-08-09 DIAGNOSIS — D509 Iron deficiency anemia, unspecified: Secondary | ICD-10-CM | POA: Diagnosis not present

## 2016-08-09 DIAGNOSIS — N186 End stage renal disease: Secondary | ICD-10-CM | POA: Diagnosis not present

## 2016-08-09 DIAGNOSIS — Z992 Dependence on renal dialysis: Secondary | ICD-10-CM | POA: Diagnosis not present

## 2016-08-10 DIAGNOSIS — D631 Anemia in chronic kidney disease: Secondary | ICD-10-CM | POA: Diagnosis not present

## 2016-08-10 DIAGNOSIS — D509 Iron deficiency anemia, unspecified: Secondary | ICD-10-CM | POA: Diagnosis not present

## 2016-08-10 DIAGNOSIS — N186 End stage renal disease: Secondary | ICD-10-CM | POA: Diagnosis not present

## 2016-08-10 DIAGNOSIS — Z992 Dependence on renal dialysis: Secondary | ICD-10-CM | POA: Diagnosis not present

## 2016-08-11 DIAGNOSIS — D631 Anemia in chronic kidney disease: Secondary | ICD-10-CM | POA: Diagnosis not present

## 2016-08-11 DIAGNOSIS — D509 Iron deficiency anemia, unspecified: Secondary | ICD-10-CM | POA: Diagnosis not present

## 2016-08-11 DIAGNOSIS — Z992 Dependence on renal dialysis: Secondary | ICD-10-CM | POA: Diagnosis not present

## 2016-08-11 DIAGNOSIS — N186 End stage renal disease: Secondary | ICD-10-CM | POA: Diagnosis not present

## 2016-08-12 DIAGNOSIS — Z992 Dependence on renal dialysis: Secondary | ICD-10-CM | POA: Diagnosis not present

## 2016-08-12 DIAGNOSIS — D631 Anemia in chronic kidney disease: Secondary | ICD-10-CM | POA: Diagnosis not present

## 2016-08-12 DIAGNOSIS — N186 End stage renal disease: Secondary | ICD-10-CM | POA: Diagnosis not present

## 2016-08-12 DIAGNOSIS — D509 Iron deficiency anemia, unspecified: Secondary | ICD-10-CM | POA: Diagnosis not present

## 2016-08-13 DIAGNOSIS — N186 End stage renal disease: Secondary | ICD-10-CM | POA: Diagnosis not present

## 2016-08-13 DIAGNOSIS — D509 Iron deficiency anemia, unspecified: Secondary | ICD-10-CM | POA: Diagnosis not present

## 2016-08-13 DIAGNOSIS — D631 Anemia in chronic kidney disease: Secondary | ICD-10-CM | POA: Diagnosis not present

## 2016-08-13 DIAGNOSIS — Z992 Dependence on renal dialysis: Secondary | ICD-10-CM | POA: Diagnosis not present

## 2016-08-14 DIAGNOSIS — N186 End stage renal disease: Secondary | ICD-10-CM | POA: Diagnosis not present

## 2016-08-14 DIAGNOSIS — Z992 Dependence on renal dialysis: Secondary | ICD-10-CM | POA: Diagnosis not present

## 2016-08-14 DIAGNOSIS — D509 Iron deficiency anemia, unspecified: Secondary | ICD-10-CM | POA: Diagnosis not present

## 2016-08-14 DIAGNOSIS — D631 Anemia in chronic kidney disease: Secondary | ICD-10-CM | POA: Diagnosis not present

## 2016-08-15 DIAGNOSIS — N186 End stage renal disease: Secondary | ICD-10-CM | POA: Diagnosis not present

## 2016-08-15 DIAGNOSIS — D509 Iron deficiency anemia, unspecified: Secondary | ICD-10-CM | POA: Diagnosis not present

## 2016-08-15 DIAGNOSIS — D631 Anemia in chronic kidney disease: Secondary | ICD-10-CM | POA: Diagnosis not present

## 2016-08-15 DIAGNOSIS — Z992 Dependence on renal dialysis: Secondary | ICD-10-CM | POA: Diagnosis not present

## 2016-08-16 DIAGNOSIS — N186 End stage renal disease: Secondary | ICD-10-CM | POA: Diagnosis not present

## 2016-08-16 DIAGNOSIS — Z992 Dependence on renal dialysis: Secondary | ICD-10-CM | POA: Diagnosis not present

## 2016-08-16 DIAGNOSIS — D509 Iron deficiency anemia, unspecified: Secondary | ICD-10-CM | POA: Diagnosis not present

## 2016-08-16 DIAGNOSIS — D631 Anemia in chronic kidney disease: Secondary | ICD-10-CM | POA: Diagnosis not present

## 2016-08-17 DIAGNOSIS — D631 Anemia in chronic kidney disease: Secondary | ICD-10-CM | POA: Diagnosis not present

## 2016-08-17 DIAGNOSIS — D509 Iron deficiency anemia, unspecified: Secondary | ICD-10-CM | POA: Diagnosis not present

## 2016-08-17 DIAGNOSIS — Z992 Dependence on renal dialysis: Secondary | ICD-10-CM | POA: Diagnosis not present

## 2016-08-17 DIAGNOSIS — N186 End stage renal disease: Secondary | ICD-10-CM | POA: Diagnosis not present

## 2016-08-18 DIAGNOSIS — D509 Iron deficiency anemia, unspecified: Secondary | ICD-10-CM | POA: Diagnosis not present

## 2016-08-18 DIAGNOSIS — N186 End stage renal disease: Secondary | ICD-10-CM | POA: Diagnosis not present

## 2016-08-18 DIAGNOSIS — D631 Anemia in chronic kidney disease: Secondary | ICD-10-CM | POA: Diagnosis not present

## 2016-08-18 DIAGNOSIS — Z992 Dependence on renal dialysis: Secondary | ICD-10-CM | POA: Diagnosis not present

## 2016-08-19 DIAGNOSIS — N186 End stage renal disease: Secondary | ICD-10-CM | POA: Diagnosis not present

## 2016-08-19 DIAGNOSIS — D631 Anemia in chronic kidney disease: Secondary | ICD-10-CM | POA: Diagnosis not present

## 2016-08-19 DIAGNOSIS — Z992 Dependence on renal dialysis: Secondary | ICD-10-CM | POA: Diagnosis not present

## 2016-08-19 DIAGNOSIS — D509 Iron deficiency anemia, unspecified: Secondary | ICD-10-CM | POA: Diagnosis not present

## 2016-08-20 DIAGNOSIS — D631 Anemia in chronic kidney disease: Secondary | ICD-10-CM | POA: Diagnosis not present

## 2016-08-20 DIAGNOSIS — D509 Iron deficiency anemia, unspecified: Secondary | ICD-10-CM | POA: Diagnosis not present

## 2016-08-20 DIAGNOSIS — N186 End stage renal disease: Secondary | ICD-10-CM | POA: Diagnosis not present

## 2016-08-20 DIAGNOSIS — Z992 Dependence on renal dialysis: Secondary | ICD-10-CM | POA: Diagnosis not present

## 2016-08-21 DIAGNOSIS — Z992 Dependence on renal dialysis: Secondary | ICD-10-CM | POA: Diagnosis not present

## 2016-08-21 DIAGNOSIS — D509 Iron deficiency anemia, unspecified: Secondary | ICD-10-CM | POA: Diagnosis not present

## 2016-08-21 DIAGNOSIS — N186 End stage renal disease: Secondary | ICD-10-CM | POA: Diagnosis not present

## 2016-08-21 DIAGNOSIS — D631 Anemia in chronic kidney disease: Secondary | ICD-10-CM | POA: Diagnosis not present

## 2016-08-22 DIAGNOSIS — D631 Anemia in chronic kidney disease: Secondary | ICD-10-CM | POA: Diagnosis not present

## 2016-08-22 DIAGNOSIS — D509 Iron deficiency anemia, unspecified: Secondary | ICD-10-CM | POA: Diagnosis not present

## 2016-08-22 DIAGNOSIS — N186 End stage renal disease: Secondary | ICD-10-CM | POA: Diagnosis not present

## 2016-08-22 DIAGNOSIS — Z992 Dependence on renal dialysis: Secondary | ICD-10-CM | POA: Diagnosis not present

## 2016-08-23 DIAGNOSIS — N186 End stage renal disease: Secondary | ICD-10-CM | POA: Diagnosis not present

## 2016-08-23 DIAGNOSIS — Z992 Dependence on renal dialysis: Secondary | ICD-10-CM | POA: Diagnosis not present

## 2016-08-23 DIAGNOSIS — D509 Iron deficiency anemia, unspecified: Secondary | ICD-10-CM | POA: Diagnosis not present

## 2016-08-23 DIAGNOSIS — D631 Anemia in chronic kidney disease: Secondary | ICD-10-CM | POA: Diagnosis not present

## 2016-08-24 DIAGNOSIS — N186 End stage renal disease: Secondary | ICD-10-CM | POA: Diagnosis not present

## 2016-08-24 DIAGNOSIS — D509 Iron deficiency anemia, unspecified: Secondary | ICD-10-CM | POA: Diagnosis not present

## 2016-08-24 DIAGNOSIS — Z992 Dependence on renal dialysis: Secondary | ICD-10-CM | POA: Diagnosis not present

## 2016-08-24 DIAGNOSIS — D631 Anemia in chronic kidney disease: Secondary | ICD-10-CM | POA: Diagnosis not present

## 2016-08-25 DIAGNOSIS — N186 End stage renal disease: Secondary | ICD-10-CM | POA: Diagnosis not present

## 2016-08-25 DIAGNOSIS — D509 Iron deficiency anemia, unspecified: Secondary | ICD-10-CM | POA: Diagnosis not present

## 2016-08-25 DIAGNOSIS — D631 Anemia in chronic kidney disease: Secondary | ICD-10-CM | POA: Diagnosis not present

## 2016-08-25 DIAGNOSIS — Z992 Dependence on renal dialysis: Secondary | ICD-10-CM | POA: Diagnosis not present

## 2016-08-26 DIAGNOSIS — D631 Anemia in chronic kidney disease: Secondary | ICD-10-CM | POA: Diagnosis not present

## 2016-08-26 DIAGNOSIS — Z992 Dependence on renal dialysis: Secondary | ICD-10-CM | POA: Diagnosis not present

## 2016-08-26 DIAGNOSIS — D509 Iron deficiency anemia, unspecified: Secondary | ICD-10-CM | POA: Diagnosis not present

## 2016-08-26 DIAGNOSIS — N186 End stage renal disease: Secondary | ICD-10-CM | POA: Diagnosis not present

## 2016-08-27 DIAGNOSIS — N186 End stage renal disease: Secondary | ICD-10-CM | POA: Diagnosis not present

## 2016-08-27 DIAGNOSIS — D509 Iron deficiency anemia, unspecified: Secondary | ICD-10-CM | POA: Diagnosis not present

## 2016-08-27 DIAGNOSIS — D631 Anemia in chronic kidney disease: Secondary | ICD-10-CM | POA: Diagnosis not present

## 2016-08-27 DIAGNOSIS — Z992 Dependence on renal dialysis: Secondary | ICD-10-CM | POA: Diagnosis not present

## 2016-08-28 DIAGNOSIS — Z992 Dependence on renal dialysis: Secondary | ICD-10-CM | POA: Diagnosis not present

## 2016-08-28 DIAGNOSIS — D631 Anemia in chronic kidney disease: Secondary | ICD-10-CM | POA: Diagnosis not present

## 2016-08-28 DIAGNOSIS — D509 Iron deficiency anemia, unspecified: Secondary | ICD-10-CM | POA: Diagnosis not present

## 2016-08-28 DIAGNOSIS — N186 End stage renal disease: Secondary | ICD-10-CM | POA: Diagnosis not present

## 2016-08-29 DIAGNOSIS — D509 Iron deficiency anemia, unspecified: Secondary | ICD-10-CM | POA: Diagnosis not present

## 2016-08-29 DIAGNOSIS — N186 End stage renal disease: Secondary | ICD-10-CM | POA: Diagnosis not present

## 2016-08-29 DIAGNOSIS — Z992 Dependence on renal dialysis: Secondary | ICD-10-CM | POA: Diagnosis not present

## 2016-08-29 DIAGNOSIS — D631 Anemia in chronic kidney disease: Secondary | ICD-10-CM | POA: Diagnosis not present

## 2016-08-30 DIAGNOSIS — D509 Iron deficiency anemia, unspecified: Secondary | ICD-10-CM | POA: Diagnosis not present

## 2016-08-30 DIAGNOSIS — D631 Anemia in chronic kidney disease: Secondary | ICD-10-CM | POA: Diagnosis not present

## 2016-08-30 DIAGNOSIS — N186 End stage renal disease: Secondary | ICD-10-CM | POA: Diagnosis not present

## 2016-08-30 DIAGNOSIS — Z992 Dependence on renal dialysis: Secondary | ICD-10-CM | POA: Diagnosis not present

## 2016-08-31 DIAGNOSIS — Z992 Dependence on renal dialysis: Secondary | ICD-10-CM | POA: Diagnosis not present

## 2016-08-31 DIAGNOSIS — N186 End stage renal disease: Secondary | ICD-10-CM | POA: Diagnosis not present

## 2016-08-31 DIAGNOSIS — D631 Anemia in chronic kidney disease: Secondary | ICD-10-CM | POA: Diagnosis not present

## 2016-08-31 DIAGNOSIS — D509 Iron deficiency anemia, unspecified: Secondary | ICD-10-CM | POA: Diagnosis not present

## 2016-09-01 DIAGNOSIS — N186 End stage renal disease: Secondary | ICD-10-CM | POA: Diagnosis not present

## 2016-09-01 DIAGNOSIS — Z992 Dependence on renal dialysis: Secondary | ICD-10-CM | POA: Diagnosis not present

## 2016-09-01 DIAGNOSIS — D509 Iron deficiency anemia, unspecified: Secondary | ICD-10-CM | POA: Diagnosis not present

## 2016-09-01 DIAGNOSIS — D631 Anemia in chronic kidney disease: Secondary | ICD-10-CM | POA: Diagnosis not present

## 2016-09-02 DIAGNOSIS — Z992 Dependence on renal dialysis: Secondary | ICD-10-CM | POA: Diagnosis not present

## 2016-09-02 DIAGNOSIS — D509 Iron deficiency anemia, unspecified: Secondary | ICD-10-CM | POA: Diagnosis not present

## 2016-09-02 DIAGNOSIS — N186 End stage renal disease: Secondary | ICD-10-CM | POA: Diagnosis not present

## 2016-09-02 DIAGNOSIS — D631 Anemia in chronic kidney disease: Secondary | ICD-10-CM | POA: Diagnosis not present

## 2016-09-03 DIAGNOSIS — D509 Iron deficiency anemia, unspecified: Secondary | ICD-10-CM | POA: Diagnosis not present

## 2016-09-03 DIAGNOSIS — D631 Anemia in chronic kidney disease: Secondary | ICD-10-CM | POA: Diagnosis not present

## 2016-09-03 DIAGNOSIS — N186 End stage renal disease: Secondary | ICD-10-CM | POA: Diagnosis not present

## 2016-09-03 DIAGNOSIS — Z992 Dependence on renal dialysis: Secondary | ICD-10-CM | POA: Diagnosis not present

## 2016-09-04 DIAGNOSIS — D631 Anemia in chronic kidney disease: Secondary | ICD-10-CM | POA: Diagnosis not present

## 2016-09-04 DIAGNOSIS — D509 Iron deficiency anemia, unspecified: Secondary | ICD-10-CM | POA: Diagnosis not present

## 2016-09-04 DIAGNOSIS — Z992 Dependence on renal dialysis: Secondary | ICD-10-CM | POA: Diagnosis not present

## 2016-09-04 DIAGNOSIS — N186 End stage renal disease: Secondary | ICD-10-CM | POA: Diagnosis not present

## 2016-09-05 DIAGNOSIS — D631 Anemia in chronic kidney disease: Secondary | ICD-10-CM | POA: Diagnosis not present

## 2016-09-05 DIAGNOSIS — D509 Iron deficiency anemia, unspecified: Secondary | ICD-10-CM | POA: Diagnosis not present

## 2016-09-05 DIAGNOSIS — N186 End stage renal disease: Secondary | ICD-10-CM | POA: Diagnosis not present

## 2016-09-05 DIAGNOSIS — Z992 Dependence on renal dialysis: Secondary | ICD-10-CM | POA: Diagnosis not present

## 2016-09-06 DIAGNOSIS — Z992 Dependence on renal dialysis: Secondary | ICD-10-CM | POA: Diagnosis not present

## 2016-09-06 DIAGNOSIS — D509 Iron deficiency anemia, unspecified: Secondary | ICD-10-CM | POA: Diagnosis not present

## 2016-09-06 DIAGNOSIS — D631 Anemia in chronic kidney disease: Secondary | ICD-10-CM | POA: Diagnosis not present

## 2016-09-06 DIAGNOSIS — N186 End stage renal disease: Secondary | ICD-10-CM | POA: Diagnosis not present

## 2016-09-07 DIAGNOSIS — N186 End stage renal disease: Secondary | ICD-10-CM | POA: Diagnosis not present

## 2016-09-07 DIAGNOSIS — D631 Anemia in chronic kidney disease: Secondary | ICD-10-CM | POA: Diagnosis not present

## 2016-09-07 DIAGNOSIS — Z992 Dependence on renal dialysis: Secondary | ICD-10-CM | POA: Diagnosis not present

## 2016-09-07 DIAGNOSIS — D509 Iron deficiency anemia, unspecified: Secondary | ICD-10-CM | POA: Diagnosis not present

## 2016-09-08 DIAGNOSIS — D509 Iron deficiency anemia, unspecified: Secondary | ICD-10-CM | POA: Diagnosis not present

## 2016-09-08 DIAGNOSIS — Z992 Dependence on renal dialysis: Secondary | ICD-10-CM | POA: Diagnosis not present

## 2016-09-08 DIAGNOSIS — D631 Anemia in chronic kidney disease: Secondary | ICD-10-CM | POA: Diagnosis not present

## 2016-09-08 DIAGNOSIS — N186 End stage renal disease: Secondary | ICD-10-CM | POA: Diagnosis not present

## 2016-09-09 DIAGNOSIS — D631 Anemia in chronic kidney disease: Secondary | ICD-10-CM | POA: Diagnosis not present

## 2016-09-09 DIAGNOSIS — Z992 Dependence on renal dialysis: Secondary | ICD-10-CM | POA: Diagnosis not present

## 2016-09-09 DIAGNOSIS — N186 End stage renal disease: Secondary | ICD-10-CM | POA: Diagnosis not present

## 2016-09-09 DIAGNOSIS — D509 Iron deficiency anemia, unspecified: Secondary | ICD-10-CM | POA: Diagnosis not present

## 2016-09-10 DIAGNOSIS — Z992 Dependence on renal dialysis: Secondary | ICD-10-CM | POA: Diagnosis not present

## 2016-09-10 DIAGNOSIS — D509 Iron deficiency anemia, unspecified: Secondary | ICD-10-CM | POA: Diagnosis not present

## 2016-09-10 DIAGNOSIS — D631 Anemia in chronic kidney disease: Secondary | ICD-10-CM | POA: Diagnosis not present

## 2016-09-10 DIAGNOSIS — N186 End stage renal disease: Secondary | ICD-10-CM | POA: Diagnosis not present

## 2016-09-11 DIAGNOSIS — D631 Anemia in chronic kidney disease: Secondary | ICD-10-CM | POA: Diagnosis not present

## 2016-09-11 DIAGNOSIS — Z992 Dependence on renal dialysis: Secondary | ICD-10-CM | POA: Diagnosis not present

## 2016-09-11 DIAGNOSIS — N186 End stage renal disease: Secondary | ICD-10-CM | POA: Diagnosis not present

## 2016-09-11 DIAGNOSIS — D509 Iron deficiency anemia, unspecified: Secondary | ICD-10-CM | POA: Diagnosis not present

## 2016-09-12 DIAGNOSIS — D631 Anemia in chronic kidney disease: Secondary | ICD-10-CM | POA: Diagnosis not present

## 2016-09-12 DIAGNOSIS — N2581 Secondary hyperparathyroidism of renal origin: Secondary | ICD-10-CM | POA: Diagnosis not present

## 2016-09-12 DIAGNOSIS — Z992 Dependence on renal dialysis: Secondary | ICD-10-CM | POA: Diagnosis not present

## 2016-09-12 DIAGNOSIS — E1165 Type 2 diabetes mellitus with hyperglycemia: Secondary | ICD-10-CM | POA: Diagnosis not present

## 2016-09-12 DIAGNOSIS — D509 Iron deficiency anemia, unspecified: Secondary | ICD-10-CM | POA: Diagnosis not present

## 2016-09-12 DIAGNOSIS — N186 End stage renal disease: Secondary | ICD-10-CM | POA: Diagnosis not present

## 2016-09-12 DIAGNOSIS — R739 Hyperglycemia, unspecified: Secondary | ICD-10-CM | POA: Diagnosis not present

## 2016-09-12 DIAGNOSIS — Z794 Long term (current) use of insulin: Secondary | ICD-10-CM | POA: Diagnosis not present

## 2016-09-13 DIAGNOSIS — N2581 Secondary hyperparathyroidism of renal origin: Secondary | ICD-10-CM | POA: Diagnosis not present

## 2016-09-13 DIAGNOSIS — Z992 Dependence on renal dialysis: Secondary | ICD-10-CM | POA: Diagnosis not present

## 2016-09-13 DIAGNOSIS — D631 Anemia in chronic kidney disease: Secondary | ICD-10-CM | POA: Diagnosis not present

## 2016-09-13 DIAGNOSIS — R739 Hyperglycemia, unspecified: Secondary | ICD-10-CM | POA: Diagnosis not present

## 2016-09-13 DIAGNOSIS — N186 End stage renal disease: Secondary | ICD-10-CM | POA: Diagnosis not present

## 2016-09-13 DIAGNOSIS — D509 Iron deficiency anemia, unspecified: Secondary | ICD-10-CM | POA: Diagnosis not present

## 2016-09-14 DIAGNOSIS — D509 Iron deficiency anemia, unspecified: Secondary | ICD-10-CM | POA: Diagnosis not present

## 2016-09-14 DIAGNOSIS — Z992 Dependence on renal dialysis: Secondary | ICD-10-CM | POA: Diagnosis not present

## 2016-09-14 DIAGNOSIS — D631 Anemia in chronic kidney disease: Secondary | ICD-10-CM | POA: Diagnosis not present

## 2016-09-14 DIAGNOSIS — N2581 Secondary hyperparathyroidism of renal origin: Secondary | ICD-10-CM | POA: Diagnosis not present

## 2016-09-14 DIAGNOSIS — N186 End stage renal disease: Secondary | ICD-10-CM | POA: Diagnosis not present

## 2016-09-14 DIAGNOSIS — R739 Hyperglycemia, unspecified: Secondary | ICD-10-CM | POA: Diagnosis not present

## 2016-09-15 DIAGNOSIS — Z992 Dependence on renal dialysis: Secondary | ICD-10-CM | POA: Diagnosis not present

## 2016-09-15 DIAGNOSIS — D509 Iron deficiency anemia, unspecified: Secondary | ICD-10-CM | POA: Diagnosis not present

## 2016-09-15 DIAGNOSIS — N2581 Secondary hyperparathyroidism of renal origin: Secondary | ICD-10-CM | POA: Diagnosis not present

## 2016-09-15 DIAGNOSIS — R739 Hyperglycemia, unspecified: Secondary | ICD-10-CM | POA: Diagnosis not present

## 2016-09-15 DIAGNOSIS — N186 End stage renal disease: Secondary | ICD-10-CM | POA: Diagnosis not present

## 2016-09-15 DIAGNOSIS — D631 Anemia in chronic kidney disease: Secondary | ICD-10-CM | POA: Diagnosis not present

## 2016-09-16 DIAGNOSIS — D509 Iron deficiency anemia, unspecified: Secondary | ICD-10-CM | POA: Diagnosis not present

## 2016-09-16 DIAGNOSIS — Z992 Dependence on renal dialysis: Secondary | ICD-10-CM | POA: Diagnosis not present

## 2016-09-16 DIAGNOSIS — D631 Anemia in chronic kidney disease: Secondary | ICD-10-CM | POA: Diagnosis not present

## 2016-09-16 DIAGNOSIS — N2581 Secondary hyperparathyroidism of renal origin: Secondary | ICD-10-CM | POA: Diagnosis not present

## 2016-09-16 DIAGNOSIS — N186 End stage renal disease: Secondary | ICD-10-CM | POA: Diagnosis not present

## 2016-09-16 DIAGNOSIS — R739 Hyperglycemia, unspecified: Secondary | ICD-10-CM | POA: Diagnosis not present

## 2016-09-17 DIAGNOSIS — Z992 Dependence on renal dialysis: Secondary | ICD-10-CM | POA: Diagnosis not present

## 2016-09-17 DIAGNOSIS — D509 Iron deficiency anemia, unspecified: Secondary | ICD-10-CM | POA: Diagnosis not present

## 2016-09-17 DIAGNOSIS — D631 Anemia in chronic kidney disease: Secondary | ICD-10-CM | POA: Diagnosis not present

## 2016-09-17 DIAGNOSIS — N2581 Secondary hyperparathyroidism of renal origin: Secondary | ICD-10-CM | POA: Diagnosis not present

## 2016-09-17 DIAGNOSIS — R739 Hyperglycemia, unspecified: Secondary | ICD-10-CM | POA: Diagnosis not present

## 2016-09-17 DIAGNOSIS — N186 End stage renal disease: Secondary | ICD-10-CM | POA: Diagnosis not present

## 2016-09-18 DIAGNOSIS — D509 Iron deficiency anemia, unspecified: Secondary | ICD-10-CM | POA: Diagnosis not present

## 2016-09-18 DIAGNOSIS — Z992 Dependence on renal dialysis: Secondary | ICD-10-CM | POA: Diagnosis not present

## 2016-09-18 DIAGNOSIS — N186 End stage renal disease: Secondary | ICD-10-CM | POA: Diagnosis not present

## 2016-09-18 DIAGNOSIS — R739 Hyperglycemia, unspecified: Secondary | ICD-10-CM | POA: Diagnosis not present

## 2016-09-18 DIAGNOSIS — N2581 Secondary hyperparathyroidism of renal origin: Secondary | ICD-10-CM | POA: Diagnosis not present

## 2016-09-18 DIAGNOSIS — D631 Anemia in chronic kidney disease: Secondary | ICD-10-CM | POA: Diagnosis not present

## 2016-09-19 DIAGNOSIS — Z992 Dependence on renal dialysis: Secondary | ICD-10-CM | POA: Diagnosis not present

## 2016-09-19 DIAGNOSIS — N2581 Secondary hyperparathyroidism of renal origin: Secondary | ICD-10-CM | POA: Diagnosis not present

## 2016-09-19 DIAGNOSIS — N186 End stage renal disease: Secondary | ICD-10-CM | POA: Diagnosis not present

## 2016-09-19 DIAGNOSIS — R739 Hyperglycemia, unspecified: Secondary | ICD-10-CM | POA: Diagnosis not present

## 2016-09-19 DIAGNOSIS — D631 Anemia in chronic kidney disease: Secondary | ICD-10-CM | POA: Diagnosis not present

## 2016-09-19 DIAGNOSIS — D509 Iron deficiency anemia, unspecified: Secondary | ICD-10-CM | POA: Diagnosis not present

## 2016-09-20 DIAGNOSIS — N2581 Secondary hyperparathyroidism of renal origin: Secondary | ICD-10-CM | POA: Diagnosis not present

## 2016-09-20 DIAGNOSIS — Z992 Dependence on renal dialysis: Secondary | ICD-10-CM | POA: Diagnosis not present

## 2016-09-20 DIAGNOSIS — N186 End stage renal disease: Secondary | ICD-10-CM | POA: Diagnosis not present

## 2016-09-20 DIAGNOSIS — D509 Iron deficiency anemia, unspecified: Secondary | ICD-10-CM | POA: Diagnosis not present

## 2016-09-20 DIAGNOSIS — D631 Anemia in chronic kidney disease: Secondary | ICD-10-CM | POA: Diagnosis not present

## 2016-09-20 DIAGNOSIS — R739 Hyperglycemia, unspecified: Secondary | ICD-10-CM | POA: Diagnosis not present

## 2016-09-21 DIAGNOSIS — N2581 Secondary hyperparathyroidism of renal origin: Secondary | ICD-10-CM | POA: Diagnosis not present

## 2016-09-21 DIAGNOSIS — R739 Hyperglycemia, unspecified: Secondary | ICD-10-CM | POA: Diagnosis not present

## 2016-09-21 DIAGNOSIS — Z992 Dependence on renal dialysis: Secondary | ICD-10-CM | POA: Diagnosis not present

## 2016-09-21 DIAGNOSIS — D509 Iron deficiency anemia, unspecified: Secondary | ICD-10-CM | POA: Diagnosis not present

## 2016-09-21 DIAGNOSIS — N186 End stage renal disease: Secondary | ICD-10-CM | POA: Diagnosis not present

## 2016-09-21 DIAGNOSIS — D631 Anemia in chronic kidney disease: Secondary | ICD-10-CM | POA: Diagnosis not present

## 2016-09-22 DIAGNOSIS — D509 Iron deficiency anemia, unspecified: Secondary | ICD-10-CM | POA: Diagnosis not present

## 2016-09-22 DIAGNOSIS — R739 Hyperglycemia, unspecified: Secondary | ICD-10-CM | POA: Diagnosis not present

## 2016-09-22 DIAGNOSIS — Z992 Dependence on renal dialysis: Secondary | ICD-10-CM | POA: Diagnosis not present

## 2016-09-22 DIAGNOSIS — N186 End stage renal disease: Secondary | ICD-10-CM | POA: Diagnosis not present

## 2016-09-22 DIAGNOSIS — D631 Anemia in chronic kidney disease: Secondary | ICD-10-CM | POA: Diagnosis not present

## 2016-09-22 DIAGNOSIS — N2581 Secondary hyperparathyroidism of renal origin: Secondary | ICD-10-CM | POA: Diagnosis not present

## 2016-09-23 DIAGNOSIS — D509 Iron deficiency anemia, unspecified: Secondary | ICD-10-CM | POA: Diagnosis not present

## 2016-09-23 DIAGNOSIS — R739 Hyperglycemia, unspecified: Secondary | ICD-10-CM | POA: Diagnosis not present

## 2016-09-23 DIAGNOSIS — N2581 Secondary hyperparathyroidism of renal origin: Secondary | ICD-10-CM | POA: Diagnosis not present

## 2016-09-23 DIAGNOSIS — N186 End stage renal disease: Secondary | ICD-10-CM | POA: Diagnosis not present

## 2016-09-23 DIAGNOSIS — D631 Anemia in chronic kidney disease: Secondary | ICD-10-CM | POA: Diagnosis not present

## 2016-09-23 DIAGNOSIS — Z992 Dependence on renal dialysis: Secondary | ICD-10-CM | POA: Diagnosis not present

## 2016-09-24 DIAGNOSIS — D631 Anemia in chronic kidney disease: Secondary | ICD-10-CM | POA: Diagnosis not present

## 2016-09-24 DIAGNOSIS — D509 Iron deficiency anemia, unspecified: Secondary | ICD-10-CM | POA: Diagnosis not present

## 2016-09-24 DIAGNOSIS — R739 Hyperglycemia, unspecified: Secondary | ICD-10-CM | POA: Diagnosis not present

## 2016-09-24 DIAGNOSIS — Z992 Dependence on renal dialysis: Secondary | ICD-10-CM | POA: Diagnosis not present

## 2016-09-24 DIAGNOSIS — N186 End stage renal disease: Secondary | ICD-10-CM | POA: Diagnosis not present

## 2016-09-24 DIAGNOSIS — N2581 Secondary hyperparathyroidism of renal origin: Secondary | ICD-10-CM | POA: Diagnosis not present

## 2016-09-25 DIAGNOSIS — N2581 Secondary hyperparathyroidism of renal origin: Secondary | ICD-10-CM | POA: Diagnosis not present

## 2016-09-25 DIAGNOSIS — Z992 Dependence on renal dialysis: Secondary | ICD-10-CM | POA: Diagnosis not present

## 2016-09-25 DIAGNOSIS — N186 End stage renal disease: Secondary | ICD-10-CM | POA: Diagnosis not present

## 2016-09-25 DIAGNOSIS — R739 Hyperglycemia, unspecified: Secondary | ICD-10-CM | POA: Diagnosis not present

## 2016-09-25 DIAGNOSIS — D509 Iron deficiency anemia, unspecified: Secondary | ICD-10-CM | POA: Diagnosis not present

## 2016-09-25 DIAGNOSIS — D631 Anemia in chronic kidney disease: Secondary | ICD-10-CM | POA: Diagnosis not present

## 2016-09-26 DIAGNOSIS — D631 Anemia in chronic kidney disease: Secondary | ICD-10-CM | POA: Diagnosis not present

## 2016-09-26 DIAGNOSIS — R739 Hyperglycemia, unspecified: Secondary | ICD-10-CM | POA: Diagnosis not present

## 2016-09-26 DIAGNOSIS — Z992 Dependence on renal dialysis: Secondary | ICD-10-CM | POA: Diagnosis not present

## 2016-09-26 DIAGNOSIS — D509 Iron deficiency anemia, unspecified: Secondary | ICD-10-CM | POA: Diagnosis not present

## 2016-09-26 DIAGNOSIS — N2581 Secondary hyperparathyroidism of renal origin: Secondary | ICD-10-CM | POA: Diagnosis not present

## 2016-09-26 DIAGNOSIS — N186 End stage renal disease: Secondary | ICD-10-CM | POA: Diagnosis not present

## 2016-09-27 DIAGNOSIS — R739 Hyperglycemia, unspecified: Secondary | ICD-10-CM | POA: Diagnosis not present

## 2016-09-27 DIAGNOSIS — D509 Iron deficiency anemia, unspecified: Secondary | ICD-10-CM | POA: Diagnosis not present

## 2016-09-27 DIAGNOSIS — Z992 Dependence on renal dialysis: Secondary | ICD-10-CM | POA: Diagnosis not present

## 2016-09-27 DIAGNOSIS — D631 Anemia in chronic kidney disease: Secondary | ICD-10-CM | POA: Diagnosis not present

## 2016-09-27 DIAGNOSIS — N186 End stage renal disease: Secondary | ICD-10-CM | POA: Diagnosis not present

## 2016-09-27 DIAGNOSIS — N2581 Secondary hyperparathyroidism of renal origin: Secondary | ICD-10-CM | POA: Diagnosis not present

## 2016-09-28 DIAGNOSIS — N2581 Secondary hyperparathyroidism of renal origin: Secondary | ICD-10-CM | POA: Diagnosis not present

## 2016-09-28 DIAGNOSIS — D631 Anemia in chronic kidney disease: Secondary | ICD-10-CM | POA: Diagnosis not present

## 2016-09-28 DIAGNOSIS — N186 End stage renal disease: Secondary | ICD-10-CM | POA: Diagnosis not present

## 2016-09-28 DIAGNOSIS — R739 Hyperglycemia, unspecified: Secondary | ICD-10-CM | POA: Diagnosis not present

## 2016-09-28 DIAGNOSIS — D509 Iron deficiency anemia, unspecified: Secondary | ICD-10-CM | POA: Diagnosis not present

## 2016-09-28 DIAGNOSIS — Z992 Dependence on renal dialysis: Secondary | ICD-10-CM | POA: Diagnosis not present

## 2016-09-29 DIAGNOSIS — Z992 Dependence on renal dialysis: Secondary | ICD-10-CM | POA: Diagnosis not present

## 2016-09-29 DIAGNOSIS — N2581 Secondary hyperparathyroidism of renal origin: Secondary | ICD-10-CM | POA: Diagnosis not present

## 2016-09-29 DIAGNOSIS — D509 Iron deficiency anemia, unspecified: Secondary | ICD-10-CM | POA: Diagnosis not present

## 2016-09-29 DIAGNOSIS — N186 End stage renal disease: Secondary | ICD-10-CM | POA: Diagnosis not present

## 2016-09-29 DIAGNOSIS — R739 Hyperglycemia, unspecified: Secondary | ICD-10-CM | POA: Diagnosis not present

## 2016-09-29 DIAGNOSIS — D631 Anemia in chronic kidney disease: Secondary | ICD-10-CM | POA: Diagnosis not present

## 2016-09-30 DIAGNOSIS — D631 Anemia in chronic kidney disease: Secondary | ICD-10-CM | POA: Diagnosis not present

## 2016-09-30 DIAGNOSIS — N186 End stage renal disease: Secondary | ICD-10-CM | POA: Diagnosis not present

## 2016-09-30 DIAGNOSIS — Z992 Dependence on renal dialysis: Secondary | ICD-10-CM | POA: Diagnosis not present

## 2016-09-30 DIAGNOSIS — N2581 Secondary hyperparathyroidism of renal origin: Secondary | ICD-10-CM | POA: Diagnosis not present

## 2016-09-30 DIAGNOSIS — R739 Hyperglycemia, unspecified: Secondary | ICD-10-CM | POA: Diagnosis not present

## 2016-09-30 DIAGNOSIS — D509 Iron deficiency anemia, unspecified: Secondary | ICD-10-CM | POA: Diagnosis not present

## 2016-10-01 DIAGNOSIS — D509 Iron deficiency anemia, unspecified: Secondary | ICD-10-CM | POA: Diagnosis not present

## 2016-10-01 DIAGNOSIS — Z992 Dependence on renal dialysis: Secondary | ICD-10-CM | POA: Diagnosis not present

## 2016-10-01 DIAGNOSIS — R739 Hyperglycemia, unspecified: Secondary | ICD-10-CM | POA: Diagnosis not present

## 2016-10-01 DIAGNOSIS — D631 Anemia in chronic kidney disease: Secondary | ICD-10-CM | POA: Diagnosis not present

## 2016-10-01 DIAGNOSIS — N186 End stage renal disease: Secondary | ICD-10-CM | POA: Diagnosis not present

## 2016-10-01 DIAGNOSIS — N2581 Secondary hyperparathyroidism of renal origin: Secondary | ICD-10-CM | POA: Diagnosis not present

## 2016-10-02 DIAGNOSIS — D631 Anemia in chronic kidney disease: Secondary | ICD-10-CM | POA: Diagnosis not present

## 2016-10-02 DIAGNOSIS — N186 End stage renal disease: Secondary | ICD-10-CM | POA: Diagnosis not present

## 2016-10-02 DIAGNOSIS — D509 Iron deficiency anemia, unspecified: Secondary | ICD-10-CM | POA: Diagnosis not present

## 2016-10-02 DIAGNOSIS — N2581 Secondary hyperparathyroidism of renal origin: Secondary | ICD-10-CM | POA: Diagnosis not present

## 2016-10-02 DIAGNOSIS — Z992 Dependence on renal dialysis: Secondary | ICD-10-CM | POA: Diagnosis not present

## 2016-10-02 DIAGNOSIS — R739 Hyperglycemia, unspecified: Secondary | ICD-10-CM | POA: Diagnosis not present

## 2016-10-03 DIAGNOSIS — N2581 Secondary hyperparathyroidism of renal origin: Secondary | ICD-10-CM | POA: Diagnosis not present

## 2016-10-03 DIAGNOSIS — D509 Iron deficiency anemia, unspecified: Secondary | ICD-10-CM | POA: Diagnosis not present

## 2016-10-03 DIAGNOSIS — D631 Anemia in chronic kidney disease: Secondary | ICD-10-CM | POA: Diagnosis not present

## 2016-10-03 DIAGNOSIS — R739 Hyperglycemia, unspecified: Secondary | ICD-10-CM | POA: Diagnosis not present

## 2016-10-03 DIAGNOSIS — Z992 Dependence on renal dialysis: Secondary | ICD-10-CM | POA: Diagnosis not present

## 2016-10-03 DIAGNOSIS — N186 End stage renal disease: Secondary | ICD-10-CM | POA: Diagnosis not present

## 2016-10-04 DIAGNOSIS — Z992 Dependence on renal dialysis: Secondary | ICD-10-CM | POA: Diagnosis not present

## 2016-10-04 DIAGNOSIS — D509 Iron deficiency anemia, unspecified: Secondary | ICD-10-CM | POA: Diagnosis not present

## 2016-10-04 DIAGNOSIS — R739 Hyperglycemia, unspecified: Secondary | ICD-10-CM | POA: Diagnosis not present

## 2016-10-04 DIAGNOSIS — D631 Anemia in chronic kidney disease: Secondary | ICD-10-CM | POA: Diagnosis not present

## 2016-10-04 DIAGNOSIS — N2581 Secondary hyperparathyroidism of renal origin: Secondary | ICD-10-CM | POA: Diagnosis not present

## 2016-10-04 DIAGNOSIS — N186 End stage renal disease: Secondary | ICD-10-CM | POA: Diagnosis not present

## 2016-10-05 DIAGNOSIS — Z992 Dependence on renal dialysis: Secondary | ICD-10-CM | POA: Diagnosis not present

## 2016-10-05 DIAGNOSIS — N186 End stage renal disease: Secondary | ICD-10-CM | POA: Diagnosis not present

## 2016-10-05 DIAGNOSIS — N2581 Secondary hyperparathyroidism of renal origin: Secondary | ICD-10-CM | POA: Diagnosis not present

## 2016-10-05 DIAGNOSIS — D631 Anemia in chronic kidney disease: Secondary | ICD-10-CM | POA: Diagnosis not present

## 2016-10-05 DIAGNOSIS — D509 Iron deficiency anemia, unspecified: Secondary | ICD-10-CM | POA: Diagnosis not present

## 2016-10-05 DIAGNOSIS — R739 Hyperglycemia, unspecified: Secondary | ICD-10-CM | POA: Diagnosis not present

## 2016-10-06 DIAGNOSIS — D509 Iron deficiency anemia, unspecified: Secondary | ICD-10-CM | POA: Diagnosis not present

## 2016-10-06 DIAGNOSIS — R739 Hyperglycemia, unspecified: Secondary | ICD-10-CM | POA: Diagnosis not present

## 2016-10-06 DIAGNOSIS — D631 Anemia in chronic kidney disease: Secondary | ICD-10-CM | POA: Diagnosis not present

## 2016-10-06 DIAGNOSIS — Z992 Dependence on renal dialysis: Secondary | ICD-10-CM | POA: Diagnosis not present

## 2016-10-06 DIAGNOSIS — N186 End stage renal disease: Secondary | ICD-10-CM | POA: Diagnosis not present

## 2016-10-06 DIAGNOSIS — N2581 Secondary hyperparathyroidism of renal origin: Secondary | ICD-10-CM | POA: Diagnosis not present

## 2016-10-07 DIAGNOSIS — R739 Hyperglycemia, unspecified: Secondary | ICD-10-CM | POA: Diagnosis not present

## 2016-10-07 DIAGNOSIS — D509 Iron deficiency anemia, unspecified: Secondary | ICD-10-CM | POA: Diagnosis not present

## 2016-10-07 DIAGNOSIS — D631 Anemia in chronic kidney disease: Secondary | ICD-10-CM | POA: Diagnosis not present

## 2016-10-07 DIAGNOSIS — Z992 Dependence on renal dialysis: Secondary | ICD-10-CM | POA: Diagnosis not present

## 2016-10-07 DIAGNOSIS — N2581 Secondary hyperparathyroidism of renal origin: Secondary | ICD-10-CM | POA: Diagnosis not present

## 2016-10-07 DIAGNOSIS — N186 End stage renal disease: Secondary | ICD-10-CM | POA: Diagnosis not present

## 2016-10-08 DIAGNOSIS — N186 End stage renal disease: Secondary | ICD-10-CM | POA: Diagnosis not present

## 2016-10-08 DIAGNOSIS — D631 Anemia in chronic kidney disease: Secondary | ICD-10-CM | POA: Diagnosis not present

## 2016-10-08 DIAGNOSIS — R739 Hyperglycemia, unspecified: Secondary | ICD-10-CM | POA: Diagnosis not present

## 2016-10-08 DIAGNOSIS — D509 Iron deficiency anemia, unspecified: Secondary | ICD-10-CM | POA: Diagnosis not present

## 2016-10-08 DIAGNOSIS — Z992 Dependence on renal dialysis: Secondary | ICD-10-CM | POA: Diagnosis not present

## 2016-10-08 DIAGNOSIS — N2581 Secondary hyperparathyroidism of renal origin: Secondary | ICD-10-CM | POA: Diagnosis not present

## 2016-10-09 DIAGNOSIS — D631 Anemia in chronic kidney disease: Secondary | ICD-10-CM | POA: Diagnosis not present

## 2016-10-09 DIAGNOSIS — N2581 Secondary hyperparathyroidism of renal origin: Secondary | ICD-10-CM | POA: Diagnosis not present

## 2016-10-09 DIAGNOSIS — N186 End stage renal disease: Secondary | ICD-10-CM | POA: Diagnosis not present

## 2016-10-09 DIAGNOSIS — Z992 Dependence on renal dialysis: Secondary | ICD-10-CM | POA: Diagnosis not present

## 2016-10-09 DIAGNOSIS — D509 Iron deficiency anemia, unspecified: Secondary | ICD-10-CM | POA: Diagnosis not present

## 2016-10-09 DIAGNOSIS — R739 Hyperglycemia, unspecified: Secondary | ICD-10-CM | POA: Diagnosis not present

## 2016-10-10 DIAGNOSIS — N186 End stage renal disease: Secondary | ICD-10-CM | POA: Diagnosis not present

## 2016-10-10 DIAGNOSIS — R6883 Chills (without fever): Secondary | ICD-10-CM | POA: Diagnosis not present

## 2016-10-10 DIAGNOSIS — I129 Hypertensive chronic kidney disease with stage 1 through stage 4 chronic kidney disease, or unspecified chronic kidney disease: Secondary | ICD-10-CM | POA: Diagnosis not present

## 2016-10-10 DIAGNOSIS — Z79899 Other long term (current) drug therapy: Secondary | ICD-10-CM | POA: Diagnosis not present

## 2016-10-10 DIAGNOSIS — Z87891 Personal history of nicotine dependence: Secondary | ICD-10-CM | POA: Diagnosis not present

## 2016-10-10 DIAGNOSIS — D509 Iron deficiency anemia, unspecified: Secondary | ICD-10-CM | POA: Diagnosis not present

## 2016-10-10 DIAGNOSIS — Z4902 Encounter for fitting and adjustment of peritoneal dialysis catheter: Secondary | ICD-10-CM | POA: Diagnosis not present

## 2016-10-10 DIAGNOSIS — N2581 Secondary hyperparathyroidism of renal origin: Secondary | ICD-10-CM | POA: Diagnosis not present

## 2016-10-10 DIAGNOSIS — R188 Other ascites: Secondary | ICD-10-CM | POA: Diagnosis not present

## 2016-10-10 DIAGNOSIS — R1031 Right lower quadrant pain: Secondary | ICD-10-CM | POA: Diagnosis not present

## 2016-10-10 DIAGNOSIS — N184 Chronic kidney disease, stage 4 (severe): Secondary | ICD-10-CM | POA: Diagnosis not present

## 2016-10-10 DIAGNOSIS — R739 Hyperglycemia, unspecified: Secondary | ICD-10-CM | POA: Diagnosis not present

## 2016-10-10 DIAGNOSIS — D631 Anemia in chronic kidney disease: Secondary | ICD-10-CM | POA: Diagnosis not present

## 2016-10-10 DIAGNOSIS — Z992 Dependence on renal dialysis: Secondary | ICD-10-CM | POA: Diagnosis not present

## 2016-10-10 DIAGNOSIS — Z7982 Long term (current) use of aspirin: Secondary | ICD-10-CM | POA: Diagnosis not present

## 2016-10-10 DIAGNOSIS — Z8249 Family history of ischemic heart disease and other diseases of the circulatory system: Secondary | ICD-10-CM | POA: Diagnosis not present

## 2016-10-10 DIAGNOSIS — E1122 Type 2 diabetes mellitus with diabetic chronic kidney disease: Secondary | ICD-10-CM | POA: Diagnosis not present

## 2016-10-10 DIAGNOSIS — R109 Unspecified abdominal pain: Secondary | ICD-10-CM | POA: Diagnosis not present

## 2016-10-11 DIAGNOSIS — Z992 Dependence on renal dialysis: Secondary | ICD-10-CM | POA: Diagnosis not present

## 2016-10-11 DIAGNOSIS — N186 End stage renal disease: Secondary | ICD-10-CM | POA: Diagnosis not present

## 2016-10-11 DIAGNOSIS — D631 Anemia in chronic kidney disease: Secondary | ICD-10-CM | POA: Diagnosis not present

## 2016-10-11 DIAGNOSIS — N2581 Secondary hyperparathyroidism of renal origin: Secondary | ICD-10-CM | POA: Diagnosis not present

## 2016-10-11 DIAGNOSIS — R739 Hyperglycemia, unspecified: Secondary | ICD-10-CM | POA: Diagnosis not present

## 2016-10-11 DIAGNOSIS — D509 Iron deficiency anemia, unspecified: Secondary | ICD-10-CM | POA: Diagnosis not present

## 2016-10-12 DIAGNOSIS — D631 Anemia in chronic kidney disease: Secondary | ICD-10-CM | POA: Diagnosis not present

## 2016-10-12 DIAGNOSIS — Z794 Long term (current) use of insulin: Secondary | ICD-10-CM | POA: Diagnosis not present

## 2016-10-12 DIAGNOSIS — Z992 Dependence on renal dialysis: Secondary | ICD-10-CM | POA: Diagnosis not present

## 2016-10-12 DIAGNOSIS — D509 Iron deficiency anemia, unspecified: Secondary | ICD-10-CM | POA: Diagnosis not present

## 2016-10-12 DIAGNOSIS — E1165 Type 2 diabetes mellitus with hyperglycemia: Secondary | ICD-10-CM | POA: Diagnosis not present

## 2016-10-12 DIAGNOSIS — N186 End stage renal disease: Secondary | ICD-10-CM | POA: Diagnosis not present

## 2016-10-12 DIAGNOSIS — R739 Hyperglycemia, unspecified: Secondary | ICD-10-CM | POA: Diagnosis not present

## 2016-10-13 DIAGNOSIS — R739 Hyperglycemia, unspecified: Secondary | ICD-10-CM | POA: Diagnosis not present

## 2016-10-13 DIAGNOSIS — D631 Anemia in chronic kidney disease: Secondary | ICD-10-CM | POA: Diagnosis not present

## 2016-10-13 DIAGNOSIS — N186 End stage renal disease: Secondary | ICD-10-CM | POA: Diagnosis not present

## 2016-10-13 DIAGNOSIS — Z992 Dependence on renal dialysis: Secondary | ICD-10-CM | POA: Diagnosis not present

## 2016-10-13 DIAGNOSIS — D509 Iron deficiency anemia, unspecified: Secondary | ICD-10-CM | POA: Diagnosis not present

## 2016-10-13 DIAGNOSIS — E1165 Type 2 diabetes mellitus with hyperglycemia: Secondary | ICD-10-CM | POA: Diagnosis not present

## 2016-10-14 DIAGNOSIS — D509 Iron deficiency anemia, unspecified: Secondary | ICD-10-CM | POA: Diagnosis not present

## 2016-10-14 DIAGNOSIS — D631 Anemia in chronic kidney disease: Secondary | ICD-10-CM | POA: Diagnosis not present

## 2016-10-14 DIAGNOSIS — E1165 Type 2 diabetes mellitus with hyperglycemia: Secondary | ICD-10-CM | POA: Diagnosis not present

## 2016-10-14 DIAGNOSIS — N186 End stage renal disease: Secondary | ICD-10-CM | POA: Diagnosis not present

## 2016-10-14 DIAGNOSIS — R739 Hyperglycemia, unspecified: Secondary | ICD-10-CM | POA: Diagnosis not present

## 2016-10-14 DIAGNOSIS — Z992 Dependence on renal dialysis: Secondary | ICD-10-CM | POA: Diagnosis not present

## 2016-10-15 DIAGNOSIS — D631 Anemia in chronic kidney disease: Secondary | ICD-10-CM | POA: Diagnosis not present

## 2016-10-15 DIAGNOSIS — E1165 Type 2 diabetes mellitus with hyperglycemia: Secondary | ICD-10-CM | POA: Diagnosis not present

## 2016-10-15 DIAGNOSIS — Z992 Dependence on renal dialysis: Secondary | ICD-10-CM | POA: Diagnosis not present

## 2016-10-15 DIAGNOSIS — N186 End stage renal disease: Secondary | ICD-10-CM | POA: Diagnosis not present

## 2016-10-15 DIAGNOSIS — R739 Hyperglycemia, unspecified: Secondary | ICD-10-CM | POA: Diagnosis not present

## 2016-10-15 DIAGNOSIS — D509 Iron deficiency anemia, unspecified: Secondary | ICD-10-CM | POA: Diagnosis not present

## 2016-10-16 DIAGNOSIS — D509 Iron deficiency anemia, unspecified: Secondary | ICD-10-CM | POA: Diagnosis not present

## 2016-10-16 DIAGNOSIS — N186 End stage renal disease: Secondary | ICD-10-CM | POA: Diagnosis not present

## 2016-10-16 DIAGNOSIS — R739 Hyperglycemia, unspecified: Secondary | ICD-10-CM | POA: Diagnosis not present

## 2016-10-16 DIAGNOSIS — E1165 Type 2 diabetes mellitus with hyperglycemia: Secondary | ICD-10-CM | POA: Diagnosis not present

## 2016-10-16 DIAGNOSIS — Z992 Dependence on renal dialysis: Secondary | ICD-10-CM | POA: Diagnosis not present

## 2016-10-16 DIAGNOSIS — D631 Anemia in chronic kidney disease: Secondary | ICD-10-CM | POA: Diagnosis not present

## 2016-10-17 DIAGNOSIS — E1165 Type 2 diabetes mellitus with hyperglycemia: Secondary | ICD-10-CM | POA: Diagnosis not present

## 2016-10-17 DIAGNOSIS — R739 Hyperglycemia, unspecified: Secondary | ICD-10-CM | POA: Diagnosis not present

## 2016-10-17 DIAGNOSIS — Z992 Dependence on renal dialysis: Secondary | ICD-10-CM | POA: Diagnosis not present

## 2016-10-17 DIAGNOSIS — D509 Iron deficiency anemia, unspecified: Secondary | ICD-10-CM | POA: Diagnosis not present

## 2016-10-17 DIAGNOSIS — D631 Anemia in chronic kidney disease: Secondary | ICD-10-CM | POA: Diagnosis not present

## 2016-10-17 DIAGNOSIS — N186 End stage renal disease: Secondary | ICD-10-CM | POA: Diagnosis not present

## 2016-10-18 DIAGNOSIS — R739 Hyperglycemia, unspecified: Secondary | ICD-10-CM | POA: Diagnosis not present

## 2016-10-18 DIAGNOSIS — D631 Anemia in chronic kidney disease: Secondary | ICD-10-CM | POA: Diagnosis not present

## 2016-10-18 DIAGNOSIS — N186 End stage renal disease: Secondary | ICD-10-CM | POA: Diagnosis not present

## 2016-10-18 DIAGNOSIS — D509 Iron deficiency anemia, unspecified: Secondary | ICD-10-CM | POA: Diagnosis not present

## 2016-10-18 DIAGNOSIS — E1165 Type 2 diabetes mellitus with hyperglycemia: Secondary | ICD-10-CM | POA: Diagnosis not present

## 2016-10-18 DIAGNOSIS — Z992 Dependence on renal dialysis: Secondary | ICD-10-CM | POA: Diagnosis not present

## 2016-10-19 DIAGNOSIS — N186 End stage renal disease: Secondary | ICD-10-CM | POA: Diagnosis not present

## 2016-10-19 DIAGNOSIS — D509 Iron deficiency anemia, unspecified: Secondary | ICD-10-CM | POA: Diagnosis not present

## 2016-10-19 DIAGNOSIS — R739 Hyperglycemia, unspecified: Secondary | ICD-10-CM | POA: Diagnosis not present

## 2016-10-19 DIAGNOSIS — E1165 Type 2 diabetes mellitus with hyperglycemia: Secondary | ICD-10-CM | POA: Diagnosis not present

## 2016-10-19 DIAGNOSIS — D631 Anemia in chronic kidney disease: Secondary | ICD-10-CM | POA: Diagnosis not present

## 2016-10-19 DIAGNOSIS — Z992 Dependence on renal dialysis: Secondary | ICD-10-CM | POA: Diagnosis not present

## 2016-10-20 DIAGNOSIS — N186 End stage renal disease: Secondary | ICD-10-CM | POA: Diagnosis not present

## 2016-10-20 DIAGNOSIS — D509 Iron deficiency anemia, unspecified: Secondary | ICD-10-CM | POA: Diagnosis not present

## 2016-10-20 DIAGNOSIS — D631 Anemia in chronic kidney disease: Secondary | ICD-10-CM | POA: Diagnosis not present

## 2016-10-20 DIAGNOSIS — Z992 Dependence on renal dialysis: Secondary | ICD-10-CM | POA: Diagnosis not present

## 2016-10-20 DIAGNOSIS — R739 Hyperglycemia, unspecified: Secondary | ICD-10-CM | POA: Diagnosis not present

## 2016-10-20 DIAGNOSIS — E1165 Type 2 diabetes mellitus with hyperglycemia: Secondary | ICD-10-CM | POA: Diagnosis not present

## 2016-10-21 DIAGNOSIS — D631 Anemia in chronic kidney disease: Secondary | ICD-10-CM | POA: Diagnosis not present

## 2016-10-21 DIAGNOSIS — E1165 Type 2 diabetes mellitus with hyperglycemia: Secondary | ICD-10-CM | POA: Diagnosis not present

## 2016-10-21 DIAGNOSIS — Z992 Dependence on renal dialysis: Secondary | ICD-10-CM | POA: Diagnosis not present

## 2016-10-21 DIAGNOSIS — D509 Iron deficiency anemia, unspecified: Secondary | ICD-10-CM | POA: Diagnosis not present

## 2016-10-21 DIAGNOSIS — N186 End stage renal disease: Secondary | ICD-10-CM | POA: Diagnosis not present

## 2016-10-21 DIAGNOSIS — R739 Hyperglycemia, unspecified: Secondary | ICD-10-CM | POA: Diagnosis not present

## 2016-10-22 DIAGNOSIS — R739 Hyperglycemia, unspecified: Secondary | ICD-10-CM | POA: Diagnosis not present

## 2016-10-22 DIAGNOSIS — D631 Anemia in chronic kidney disease: Secondary | ICD-10-CM | POA: Diagnosis not present

## 2016-10-22 DIAGNOSIS — Z992 Dependence on renal dialysis: Secondary | ICD-10-CM | POA: Diagnosis not present

## 2016-10-22 DIAGNOSIS — N186 End stage renal disease: Secondary | ICD-10-CM | POA: Diagnosis not present

## 2016-10-22 DIAGNOSIS — D509 Iron deficiency anemia, unspecified: Secondary | ICD-10-CM | POA: Diagnosis not present

## 2016-10-22 DIAGNOSIS — E1165 Type 2 diabetes mellitus with hyperglycemia: Secondary | ICD-10-CM | POA: Diagnosis not present

## 2016-10-23 DIAGNOSIS — E1165 Type 2 diabetes mellitus with hyperglycemia: Secondary | ICD-10-CM | POA: Diagnosis not present

## 2016-10-23 DIAGNOSIS — N186 End stage renal disease: Secondary | ICD-10-CM | POA: Diagnosis not present

## 2016-10-23 DIAGNOSIS — R739 Hyperglycemia, unspecified: Secondary | ICD-10-CM | POA: Diagnosis not present

## 2016-10-23 DIAGNOSIS — Z992 Dependence on renal dialysis: Secondary | ICD-10-CM | POA: Diagnosis not present

## 2016-10-23 DIAGNOSIS — D509 Iron deficiency anemia, unspecified: Secondary | ICD-10-CM | POA: Diagnosis not present

## 2016-10-23 DIAGNOSIS — D631 Anemia in chronic kidney disease: Secondary | ICD-10-CM | POA: Diagnosis not present

## 2016-10-24 DIAGNOSIS — R739 Hyperglycemia, unspecified: Secondary | ICD-10-CM | POA: Diagnosis not present

## 2016-10-24 DIAGNOSIS — Z992 Dependence on renal dialysis: Secondary | ICD-10-CM | POA: Diagnosis not present

## 2016-10-24 DIAGNOSIS — D631 Anemia in chronic kidney disease: Secondary | ICD-10-CM | POA: Diagnosis not present

## 2016-10-24 DIAGNOSIS — D509 Iron deficiency anemia, unspecified: Secondary | ICD-10-CM | POA: Diagnosis not present

## 2016-10-24 DIAGNOSIS — E1165 Type 2 diabetes mellitus with hyperglycemia: Secondary | ICD-10-CM | POA: Diagnosis not present

## 2016-10-24 DIAGNOSIS — N186 End stage renal disease: Secondary | ICD-10-CM | POA: Diagnosis not present

## 2016-10-25 DIAGNOSIS — R739 Hyperglycemia, unspecified: Secondary | ICD-10-CM | POA: Diagnosis not present

## 2016-10-25 DIAGNOSIS — Z992 Dependence on renal dialysis: Secondary | ICD-10-CM | POA: Diagnosis not present

## 2016-10-25 DIAGNOSIS — D509 Iron deficiency anemia, unspecified: Secondary | ICD-10-CM | POA: Diagnosis not present

## 2016-10-25 DIAGNOSIS — E1165 Type 2 diabetes mellitus with hyperglycemia: Secondary | ICD-10-CM | POA: Diagnosis not present

## 2016-10-25 DIAGNOSIS — D631 Anemia in chronic kidney disease: Secondary | ICD-10-CM | POA: Diagnosis not present

## 2016-10-25 DIAGNOSIS — N186 End stage renal disease: Secondary | ICD-10-CM | POA: Diagnosis not present

## 2016-10-26 DIAGNOSIS — R739 Hyperglycemia, unspecified: Secondary | ICD-10-CM | POA: Diagnosis not present

## 2016-10-26 DIAGNOSIS — D509 Iron deficiency anemia, unspecified: Secondary | ICD-10-CM | POA: Diagnosis not present

## 2016-10-26 DIAGNOSIS — E1165 Type 2 diabetes mellitus with hyperglycemia: Secondary | ICD-10-CM | POA: Diagnosis not present

## 2016-10-26 DIAGNOSIS — Z992 Dependence on renal dialysis: Secondary | ICD-10-CM | POA: Diagnosis not present

## 2016-10-26 DIAGNOSIS — D631 Anemia in chronic kidney disease: Secondary | ICD-10-CM | POA: Diagnosis not present

## 2016-10-26 DIAGNOSIS — N186 End stage renal disease: Secondary | ICD-10-CM | POA: Diagnosis not present

## 2016-10-27 DIAGNOSIS — Z992 Dependence on renal dialysis: Secondary | ICD-10-CM | POA: Diagnosis not present

## 2016-10-27 DIAGNOSIS — N186 End stage renal disease: Secondary | ICD-10-CM | POA: Diagnosis not present

## 2016-10-27 DIAGNOSIS — R739 Hyperglycemia, unspecified: Secondary | ICD-10-CM | POA: Diagnosis not present

## 2016-10-27 DIAGNOSIS — D631 Anemia in chronic kidney disease: Secondary | ICD-10-CM | POA: Diagnosis not present

## 2016-10-27 DIAGNOSIS — E1165 Type 2 diabetes mellitus with hyperglycemia: Secondary | ICD-10-CM | POA: Diagnosis not present

## 2016-10-27 DIAGNOSIS — D509 Iron deficiency anemia, unspecified: Secondary | ICD-10-CM | POA: Diagnosis not present

## 2016-10-28 DIAGNOSIS — R739 Hyperglycemia, unspecified: Secondary | ICD-10-CM | POA: Diagnosis not present

## 2016-10-28 DIAGNOSIS — Z992 Dependence on renal dialysis: Secondary | ICD-10-CM | POA: Diagnosis not present

## 2016-10-28 DIAGNOSIS — E1165 Type 2 diabetes mellitus with hyperglycemia: Secondary | ICD-10-CM | POA: Diagnosis not present

## 2016-10-28 DIAGNOSIS — D509 Iron deficiency anemia, unspecified: Secondary | ICD-10-CM | POA: Diagnosis not present

## 2016-10-28 DIAGNOSIS — D631 Anemia in chronic kidney disease: Secondary | ICD-10-CM | POA: Diagnosis not present

## 2016-10-28 DIAGNOSIS — N186 End stage renal disease: Secondary | ICD-10-CM | POA: Diagnosis not present

## 2016-10-29 DIAGNOSIS — Z992 Dependence on renal dialysis: Secondary | ICD-10-CM | POA: Diagnosis not present

## 2016-10-29 DIAGNOSIS — N186 End stage renal disease: Secondary | ICD-10-CM | POA: Diagnosis not present

## 2016-10-29 DIAGNOSIS — E1165 Type 2 diabetes mellitus with hyperglycemia: Secondary | ICD-10-CM | POA: Diagnosis not present

## 2016-10-29 DIAGNOSIS — R739 Hyperglycemia, unspecified: Secondary | ICD-10-CM | POA: Diagnosis not present

## 2016-10-29 DIAGNOSIS — D631 Anemia in chronic kidney disease: Secondary | ICD-10-CM | POA: Diagnosis not present

## 2016-10-29 DIAGNOSIS — D509 Iron deficiency anemia, unspecified: Secondary | ICD-10-CM | POA: Diagnosis not present

## 2016-10-30 DIAGNOSIS — N186 End stage renal disease: Secondary | ICD-10-CM | POA: Diagnosis not present

## 2016-10-30 DIAGNOSIS — Z992 Dependence on renal dialysis: Secondary | ICD-10-CM | POA: Diagnosis not present

## 2016-10-30 DIAGNOSIS — R739 Hyperglycemia, unspecified: Secondary | ICD-10-CM | POA: Diagnosis not present

## 2016-10-30 DIAGNOSIS — D509 Iron deficiency anemia, unspecified: Secondary | ICD-10-CM | POA: Diagnosis not present

## 2016-10-30 DIAGNOSIS — D631 Anemia in chronic kidney disease: Secondary | ICD-10-CM | POA: Diagnosis not present

## 2016-10-30 DIAGNOSIS — E1165 Type 2 diabetes mellitus with hyperglycemia: Secondary | ICD-10-CM | POA: Diagnosis not present

## 2016-10-31 DIAGNOSIS — N186 End stage renal disease: Secondary | ICD-10-CM | POA: Diagnosis not present

## 2016-10-31 DIAGNOSIS — Z992 Dependence on renal dialysis: Secondary | ICD-10-CM | POA: Diagnosis not present

## 2016-10-31 DIAGNOSIS — D631 Anemia in chronic kidney disease: Secondary | ICD-10-CM | POA: Diagnosis not present

## 2016-10-31 DIAGNOSIS — E1165 Type 2 diabetes mellitus with hyperglycemia: Secondary | ICD-10-CM | POA: Diagnosis not present

## 2016-10-31 DIAGNOSIS — R739 Hyperglycemia, unspecified: Secondary | ICD-10-CM | POA: Diagnosis not present

## 2016-10-31 DIAGNOSIS — D509 Iron deficiency anemia, unspecified: Secondary | ICD-10-CM | POA: Diagnosis not present

## 2016-11-01 DIAGNOSIS — Z992 Dependence on renal dialysis: Secondary | ICD-10-CM | POA: Diagnosis not present

## 2016-11-01 DIAGNOSIS — D631 Anemia in chronic kidney disease: Secondary | ICD-10-CM | POA: Diagnosis not present

## 2016-11-01 DIAGNOSIS — R739 Hyperglycemia, unspecified: Secondary | ICD-10-CM | POA: Diagnosis not present

## 2016-11-01 DIAGNOSIS — E1165 Type 2 diabetes mellitus with hyperglycemia: Secondary | ICD-10-CM | POA: Diagnosis not present

## 2016-11-01 DIAGNOSIS — N186 End stage renal disease: Secondary | ICD-10-CM | POA: Diagnosis not present

## 2016-11-01 DIAGNOSIS — D509 Iron deficiency anemia, unspecified: Secondary | ICD-10-CM | POA: Diagnosis not present

## 2016-11-02 DIAGNOSIS — D509 Iron deficiency anemia, unspecified: Secondary | ICD-10-CM | POA: Diagnosis not present

## 2016-11-02 DIAGNOSIS — R739 Hyperglycemia, unspecified: Secondary | ICD-10-CM | POA: Diagnosis not present

## 2016-11-02 DIAGNOSIS — Z992 Dependence on renal dialysis: Secondary | ICD-10-CM | POA: Diagnosis not present

## 2016-11-02 DIAGNOSIS — E1165 Type 2 diabetes mellitus with hyperglycemia: Secondary | ICD-10-CM | POA: Diagnosis not present

## 2016-11-02 DIAGNOSIS — N186 End stage renal disease: Secondary | ICD-10-CM | POA: Diagnosis not present

## 2016-11-02 DIAGNOSIS — D631 Anemia in chronic kidney disease: Secondary | ICD-10-CM | POA: Diagnosis not present

## 2016-11-03 DIAGNOSIS — N186 End stage renal disease: Secondary | ICD-10-CM | POA: Diagnosis not present

## 2016-11-03 DIAGNOSIS — D509 Iron deficiency anemia, unspecified: Secondary | ICD-10-CM | POA: Diagnosis not present

## 2016-11-03 DIAGNOSIS — D631 Anemia in chronic kidney disease: Secondary | ICD-10-CM | POA: Diagnosis not present

## 2016-11-03 DIAGNOSIS — E1165 Type 2 diabetes mellitus with hyperglycemia: Secondary | ICD-10-CM | POA: Diagnosis not present

## 2016-11-03 DIAGNOSIS — Z992 Dependence on renal dialysis: Secondary | ICD-10-CM | POA: Diagnosis not present

## 2016-11-03 DIAGNOSIS — R739 Hyperglycemia, unspecified: Secondary | ICD-10-CM | POA: Diagnosis not present

## 2016-11-04 DIAGNOSIS — D631 Anemia in chronic kidney disease: Secondary | ICD-10-CM | POA: Diagnosis not present

## 2016-11-04 DIAGNOSIS — D509 Iron deficiency anemia, unspecified: Secondary | ICD-10-CM | POA: Diagnosis not present

## 2016-11-04 DIAGNOSIS — E1165 Type 2 diabetes mellitus with hyperglycemia: Secondary | ICD-10-CM | POA: Diagnosis not present

## 2016-11-04 DIAGNOSIS — Z992 Dependence on renal dialysis: Secondary | ICD-10-CM | POA: Diagnosis not present

## 2016-11-04 DIAGNOSIS — R739 Hyperglycemia, unspecified: Secondary | ICD-10-CM | POA: Diagnosis not present

## 2016-11-04 DIAGNOSIS — N186 End stage renal disease: Secondary | ICD-10-CM | POA: Diagnosis not present

## 2016-11-05 DIAGNOSIS — E1165 Type 2 diabetes mellitus with hyperglycemia: Secondary | ICD-10-CM | POA: Diagnosis not present

## 2016-11-05 DIAGNOSIS — D631 Anemia in chronic kidney disease: Secondary | ICD-10-CM | POA: Diagnosis not present

## 2016-11-05 DIAGNOSIS — D509 Iron deficiency anemia, unspecified: Secondary | ICD-10-CM | POA: Diagnosis not present

## 2016-11-05 DIAGNOSIS — Z992 Dependence on renal dialysis: Secondary | ICD-10-CM | POA: Diagnosis not present

## 2016-11-05 DIAGNOSIS — R739 Hyperglycemia, unspecified: Secondary | ICD-10-CM | POA: Diagnosis not present

## 2016-11-05 DIAGNOSIS — N186 End stage renal disease: Secondary | ICD-10-CM | POA: Diagnosis not present

## 2016-11-06 DIAGNOSIS — D631 Anemia in chronic kidney disease: Secondary | ICD-10-CM | POA: Diagnosis not present

## 2016-11-06 DIAGNOSIS — N186 End stage renal disease: Secondary | ICD-10-CM | POA: Diagnosis not present

## 2016-11-06 DIAGNOSIS — E1165 Type 2 diabetes mellitus with hyperglycemia: Secondary | ICD-10-CM | POA: Diagnosis not present

## 2016-11-06 DIAGNOSIS — D509 Iron deficiency anemia, unspecified: Secondary | ICD-10-CM | POA: Diagnosis not present

## 2016-11-06 DIAGNOSIS — Z992 Dependence on renal dialysis: Secondary | ICD-10-CM | POA: Diagnosis not present

## 2016-11-06 DIAGNOSIS — R739 Hyperglycemia, unspecified: Secondary | ICD-10-CM | POA: Diagnosis not present

## 2016-11-07 DIAGNOSIS — D509 Iron deficiency anemia, unspecified: Secondary | ICD-10-CM | POA: Diagnosis not present

## 2016-11-07 DIAGNOSIS — E1165 Type 2 diabetes mellitus with hyperglycemia: Secondary | ICD-10-CM | POA: Diagnosis not present

## 2016-11-07 DIAGNOSIS — Z992 Dependence on renal dialysis: Secondary | ICD-10-CM | POA: Diagnosis not present

## 2016-11-07 DIAGNOSIS — N186 End stage renal disease: Secondary | ICD-10-CM | POA: Diagnosis not present

## 2016-11-07 DIAGNOSIS — R739 Hyperglycemia, unspecified: Secondary | ICD-10-CM | POA: Diagnosis not present

## 2016-11-07 DIAGNOSIS — D631 Anemia in chronic kidney disease: Secondary | ICD-10-CM | POA: Diagnosis not present

## 2016-11-08 DIAGNOSIS — D631 Anemia in chronic kidney disease: Secondary | ICD-10-CM | POA: Diagnosis not present

## 2016-11-08 DIAGNOSIS — Z992 Dependence on renal dialysis: Secondary | ICD-10-CM | POA: Diagnosis not present

## 2016-11-08 DIAGNOSIS — E1165 Type 2 diabetes mellitus with hyperglycemia: Secondary | ICD-10-CM | POA: Diagnosis not present

## 2016-11-08 DIAGNOSIS — R739 Hyperglycemia, unspecified: Secondary | ICD-10-CM | POA: Diagnosis not present

## 2016-11-08 DIAGNOSIS — N186 End stage renal disease: Secondary | ICD-10-CM | POA: Diagnosis not present

## 2016-11-08 DIAGNOSIS — D509 Iron deficiency anemia, unspecified: Secondary | ICD-10-CM | POA: Diagnosis not present

## 2016-11-09 DIAGNOSIS — D631 Anemia in chronic kidney disease: Secondary | ICD-10-CM | POA: Diagnosis not present

## 2016-11-09 DIAGNOSIS — N186 End stage renal disease: Secondary | ICD-10-CM | POA: Diagnosis not present

## 2016-11-09 DIAGNOSIS — Z992 Dependence on renal dialysis: Secondary | ICD-10-CM | POA: Diagnosis not present

## 2016-11-09 DIAGNOSIS — D509 Iron deficiency anemia, unspecified: Secondary | ICD-10-CM | POA: Diagnosis not present

## 2016-11-09 DIAGNOSIS — E1165 Type 2 diabetes mellitus with hyperglycemia: Secondary | ICD-10-CM | POA: Diagnosis not present

## 2016-11-09 DIAGNOSIS — R739 Hyperglycemia, unspecified: Secondary | ICD-10-CM | POA: Diagnosis not present

## 2016-11-10 DIAGNOSIS — D509 Iron deficiency anemia, unspecified: Secondary | ICD-10-CM | POA: Diagnosis not present

## 2016-11-10 DIAGNOSIS — R739 Hyperglycemia, unspecified: Secondary | ICD-10-CM | POA: Diagnosis not present

## 2016-11-10 DIAGNOSIS — E1165 Type 2 diabetes mellitus with hyperglycemia: Secondary | ICD-10-CM | POA: Diagnosis not present

## 2016-11-10 DIAGNOSIS — N186 End stage renal disease: Secondary | ICD-10-CM | POA: Diagnosis not present

## 2016-11-10 DIAGNOSIS — D631 Anemia in chronic kidney disease: Secondary | ICD-10-CM | POA: Diagnosis not present

## 2016-11-10 DIAGNOSIS — Z992 Dependence on renal dialysis: Secondary | ICD-10-CM | POA: Diagnosis not present

## 2016-11-11 DIAGNOSIS — E1165 Type 2 diabetes mellitus with hyperglycemia: Secondary | ICD-10-CM | POA: Diagnosis not present

## 2016-11-11 DIAGNOSIS — R739 Hyperglycemia, unspecified: Secondary | ICD-10-CM | POA: Diagnosis not present

## 2016-11-11 DIAGNOSIS — D509 Iron deficiency anemia, unspecified: Secondary | ICD-10-CM | POA: Diagnosis not present

## 2016-11-11 DIAGNOSIS — N186 End stage renal disease: Secondary | ICD-10-CM | POA: Diagnosis not present

## 2016-11-11 DIAGNOSIS — Z992 Dependence on renal dialysis: Secondary | ICD-10-CM | POA: Diagnosis not present

## 2016-11-11 DIAGNOSIS — D631 Anemia in chronic kidney disease: Secondary | ICD-10-CM | POA: Diagnosis not present

## 2016-11-12 DIAGNOSIS — D509 Iron deficiency anemia, unspecified: Secondary | ICD-10-CM | POA: Diagnosis not present

## 2016-11-12 DIAGNOSIS — N186 End stage renal disease: Secondary | ICD-10-CM | POA: Diagnosis not present

## 2016-11-12 DIAGNOSIS — D631 Anemia in chronic kidney disease: Secondary | ICD-10-CM | POA: Diagnosis not present

## 2016-11-12 DIAGNOSIS — Z992 Dependence on renal dialysis: Secondary | ICD-10-CM | POA: Diagnosis not present

## 2016-11-13 DIAGNOSIS — Z992 Dependence on renal dialysis: Secondary | ICD-10-CM | POA: Diagnosis not present

## 2016-11-13 DIAGNOSIS — N186 End stage renal disease: Secondary | ICD-10-CM | POA: Diagnosis not present

## 2016-11-13 DIAGNOSIS — D631 Anemia in chronic kidney disease: Secondary | ICD-10-CM | POA: Diagnosis not present

## 2016-11-13 DIAGNOSIS — D509 Iron deficiency anemia, unspecified: Secondary | ICD-10-CM | POA: Diagnosis not present

## 2016-11-14 DIAGNOSIS — D509 Iron deficiency anemia, unspecified: Secondary | ICD-10-CM | POA: Diagnosis not present

## 2016-11-14 DIAGNOSIS — Z992 Dependence on renal dialysis: Secondary | ICD-10-CM | POA: Diagnosis not present

## 2016-11-14 DIAGNOSIS — D631 Anemia in chronic kidney disease: Secondary | ICD-10-CM | POA: Diagnosis not present

## 2016-11-14 DIAGNOSIS — N186 End stage renal disease: Secondary | ICD-10-CM | POA: Diagnosis not present

## 2016-11-15 DIAGNOSIS — N186 End stage renal disease: Secondary | ICD-10-CM | POA: Diagnosis not present

## 2016-11-15 DIAGNOSIS — Z992 Dependence on renal dialysis: Secondary | ICD-10-CM | POA: Diagnosis not present

## 2016-11-15 DIAGNOSIS — D509 Iron deficiency anemia, unspecified: Secondary | ICD-10-CM | POA: Diagnosis not present

## 2016-11-15 DIAGNOSIS — D631 Anemia in chronic kidney disease: Secondary | ICD-10-CM | POA: Diagnosis not present

## 2016-11-16 DIAGNOSIS — D509 Iron deficiency anemia, unspecified: Secondary | ICD-10-CM | POA: Diagnosis not present

## 2016-11-16 DIAGNOSIS — D631 Anemia in chronic kidney disease: Secondary | ICD-10-CM | POA: Diagnosis not present

## 2016-11-16 DIAGNOSIS — N186 End stage renal disease: Secondary | ICD-10-CM | POA: Diagnosis not present

## 2016-11-16 DIAGNOSIS — Z992 Dependence on renal dialysis: Secondary | ICD-10-CM | POA: Diagnosis not present

## 2016-11-17 DIAGNOSIS — D631 Anemia in chronic kidney disease: Secondary | ICD-10-CM | POA: Diagnosis not present

## 2016-11-17 DIAGNOSIS — N186 End stage renal disease: Secondary | ICD-10-CM | POA: Diagnosis not present

## 2016-11-17 DIAGNOSIS — Z992 Dependence on renal dialysis: Secondary | ICD-10-CM | POA: Diagnosis not present

## 2016-11-17 DIAGNOSIS — D509 Iron deficiency anemia, unspecified: Secondary | ICD-10-CM | POA: Diagnosis not present

## 2016-11-18 DIAGNOSIS — D631 Anemia in chronic kidney disease: Secondary | ICD-10-CM | POA: Diagnosis not present

## 2016-11-18 DIAGNOSIS — N186 End stage renal disease: Secondary | ICD-10-CM | POA: Diagnosis not present

## 2016-11-18 DIAGNOSIS — D509 Iron deficiency anemia, unspecified: Secondary | ICD-10-CM | POA: Diagnosis not present

## 2016-11-18 DIAGNOSIS — Z992 Dependence on renal dialysis: Secondary | ICD-10-CM | POA: Diagnosis not present

## 2016-11-19 DIAGNOSIS — D631 Anemia in chronic kidney disease: Secondary | ICD-10-CM | POA: Diagnosis not present

## 2016-11-19 DIAGNOSIS — D509 Iron deficiency anemia, unspecified: Secondary | ICD-10-CM | POA: Diagnosis not present

## 2016-11-19 DIAGNOSIS — Z992 Dependence on renal dialysis: Secondary | ICD-10-CM | POA: Diagnosis not present

## 2016-11-19 DIAGNOSIS — N186 End stage renal disease: Secondary | ICD-10-CM | POA: Diagnosis not present

## 2016-11-20 DIAGNOSIS — D509 Iron deficiency anemia, unspecified: Secondary | ICD-10-CM | POA: Diagnosis not present

## 2016-11-20 DIAGNOSIS — Z992 Dependence on renal dialysis: Secondary | ICD-10-CM | POA: Diagnosis not present

## 2016-11-20 DIAGNOSIS — D631 Anemia in chronic kidney disease: Secondary | ICD-10-CM | POA: Diagnosis not present

## 2016-11-20 DIAGNOSIS — N186 End stage renal disease: Secondary | ICD-10-CM | POA: Diagnosis not present

## 2016-11-21 DIAGNOSIS — D631 Anemia in chronic kidney disease: Secondary | ICD-10-CM | POA: Diagnosis not present

## 2016-11-21 DIAGNOSIS — Z992 Dependence on renal dialysis: Secondary | ICD-10-CM | POA: Diagnosis not present

## 2016-11-21 DIAGNOSIS — N186 End stage renal disease: Secondary | ICD-10-CM | POA: Diagnosis not present

## 2016-11-21 DIAGNOSIS — D509 Iron deficiency anemia, unspecified: Secondary | ICD-10-CM | POA: Diagnosis not present

## 2016-11-22 DIAGNOSIS — D509 Iron deficiency anemia, unspecified: Secondary | ICD-10-CM | POA: Diagnosis not present

## 2016-11-22 DIAGNOSIS — D631 Anemia in chronic kidney disease: Secondary | ICD-10-CM | POA: Diagnosis not present

## 2016-11-22 DIAGNOSIS — Z992 Dependence on renal dialysis: Secondary | ICD-10-CM | POA: Diagnosis not present

## 2016-11-22 DIAGNOSIS — N186 End stage renal disease: Secondary | ICD-10-CM | POA: Diagnosis not present

## 2016-11-23 DIAGNOSIS — D509 Iron deficiency anemia, unspecified: Secondary | ICD-10-CM | POA: Diagnosis not present

## 2016-11-23 DIAGNOSIS — N186 End stage renal disease: Secondary | ICD-10-CM | POA: Diagnosis not present

## 2016-11-23 DIAGNOSIS — Z992 Dependence on renal dialysis: Secondary | ICD-10-CM | POA: Diagnosis not present

## 2016-11-23 DIAGNOSIS — D631 Anemia in chronic kidney disease: Secondary | ICD-10-CM | POA: Diagnosis not present

## 2016-11-24 DIAGNOSIS — D631 Anemia in chronic kidney disease: Secondary | ICD-10-CM | POA: Diagnosis not present

## 2016-11-24 DIAGNOSIS — N186 End stage renal disease: Secondary | ICD-10-CM | POA: Diagnosis not present

## 2016-11-24 DIAGNOSIS — Z992 Dependence on renal dialysis: Secondary | ICD-10-CM | POA: Diagnosis not present

## 2016-11-24 DIAGNOSIS — D509 Iron deficiency anemia, unspecified: Secondary | ICD-10-CM | POA: Diagnosis not present

## 2016-11-25 DIAGNOSIS — D631 Anemia in chronic kidney disease: Secondary | ICD-10-CM | POA: Diagnosis not present

## 2016-11-25 DIAGNOSIS — D509 Iron deficiency anemia, unspecified: Secondary | ICD-10-CM | POA: Diagnosis not present

## 2016-11-25 DIAGNOSIS — N186 End stage renal disease: Secondary | ICD-10-CM | POA: Diagnosis not present

## 2016-11-25 DIAGNOSIS — Z992 Dependence on renal dialysis: Secondary | ICD-10-CM | POA: Diagnosis not present

## 2016-11-26 DIAGNOSIS — D509 Iron deficiency anemia, unspecified: Secondary | ICD-10-CM | POA: Diagnosis not present

## 2016-11-26 DIAGNOSIS — N186 End stage renal disease: Secondary | ICD-10-CM | POA: Diagnosis not present

## 2016-11-26 DIAGNOSIS — Z992 Dependence on renal dialysis: Secondary | ICD-10-CM | POA: Diagnosis not present

## 2016-11-26 DIAGNOSIS — D631 Anemia in chronic kidney disease: Secondary | ICD-10-CM | POA: Diagnosis not present

## 2016-11-27 DIAGNOSIS — D509 Iron deficiency anemia, unspecified: Secondary | ICD-10-CM | POA: Diagnosis not present

## 2016-11-27 DIAGNOSIS — D631 Anemia in chronic kidney disease: Secondary | ICD-10-CM | POA: Diagnosis not present

## 2016-11-27 DIAGNOSIS — N186 End stage renal disease: Secondary | ICD-10-CM | POA: Diagnosis not present

## 2016-11-27 DIAGNOSIS — Z992 Dependence on renal dialysis: Secondary | ICD-10-CM | POA: Diagnosis not present

## 2016-11-28 DIAGNOSIS — N186 End stage renal disease: Secondary | ICD-10-CM | POA: Diagnosis not present

## 2016-11-28 DIAGNOSIS — D631 Anemia in chronic kidney disease: Secondary | ICD-10-CM | POA: Diagnosis not present

## 2016-11-28 DIAGNOSIS — Z992 Dependence on renal dialysis: Secondary | ICD-10-CM | POA: Diagnosis not present

## 2016-11-28 DIAGNOSIS — D509 Iron deficiency anemia, unspecified: Secondary | ICD-10-CM | POA: Diagnosis not present

## 2016-11-29 DIAGNOSIS — Z992 Dependence on renal dialysis: Secondary | ICD-10-CM | POA: Diagnosis not present

## 2016-11-29 DIAGNOSIS — D509 Iron deficiency anemia, unspecified: Secondary | ICD-10-CM | POA: Diagnosis not present

## 2016-11-29 DIAGNOSIS — D631 Anemia in chronic kidney disease: Secondary | ICD-10-CM | POA: Diagnosis not present

## 2016-11-29 DIAGNOSIS — N186 End stage renal disease: Secondary | ICD-10-CM | POA: Diagnosis not present

## 2016-11-30 DIAGNOSIS — D631 Anemia in chronic kidney disease: Secondary | ICD-10-CM | POA: Diagnosis not present

## 2016-11-30 DIAGNOSIS — Z992 Dependence on renal dialysis: Secondary | ICD-10-CM | POA: Diagnosis not present

## 2016-11-30 DIAGNOSIS — N186 End stage renal disease: Secondary | ICD-10-CM | POA: Diagnosis not present

## 2016-11-30 DIAGNOSIS — D509 Iron deficiency anemia, unspecified: Secondary | ICD-10-CM | POA: Diagnosis not present

## 2016-12-01 DIAGNOSIS — Z992 Dependence on renal dialysis: Secondary | ICD-10-CM | POA: Diagnosis not present

## 2016-12-01 DIAGNOSIS — D631 Anemia in chronic kidney disease: Secondary | ICD-10-CM | POA: Diagnosis not present

## 2016-12-01 DIAGNOSIS — D509 Iron deficiency anemia, unspecified: Secondary | ICD-10-CM | POA: Diagnosis not present

## 2016-12-01 DIAGNOSIS — N186 End stage renal disease: Secondary | ICD-10-CM | POA: Diagnosis not present

## 2016-12-02 DIAGNOSIS — N186 End stage renal disease: Secondary | ICD-10-CM | POA: Diagnosis not present

## 2016-12-02 DIAGNOSIS — D631 Anemia in chronic kidney disease: Secondary | ICD-10-CM | POA: Diagnosis not present

## 2016-12-02 DIAGNOSIS — D509 Iron deficiency anemia, unspecified: Secondary | ICD-10-CM | POA: Diagnosis not present

## 2016-12-02 DIAGNOSIS — Z992 Dependence on renal dialysis: Secondary | ICD-10-CM | POA: Diagnosis not present

## 2016-12-03 DIAGNOSIS — D631 Anemia in chronic kidney disease: Secondary | ICD-10-CM | POA: Diagnosis not present

## 2016-12-03 DIAGNOSIS — D509 Iron deficiency anemia, unspecified: Secondary | ICD-10-CM | POA: Diagnosis not present

## 2016-12-03 DIAGNOSIS — Z992 Dependence on renal dialysis: Secondary | ICD-10-CM | POA: Diagnosis not present

## 2016-12-03 DIAGNOSIS — N186 End stage renal disease: Secondary | ICD-10-CM | POA: Diagnosis not present

## 2016-12-04 DIAGNOSIS — N186 End stage renal disease: Secondary | ICD-10-CM | POA: Diagnosis not present

## 2016-12-04 DIAGNOSIS — D631 Anemia in chronic kidney disease: Secondary | ICD-10-CM | POA: Diagnosis not present

## 2016-12-04 DIAGNOSIS — Z992 Dependence on renal dialysis: Secondary | ICD-10-CM | POA: Diagnosis not present

## 2016-12-04 DIAGNOSIS — D509 Iron deficiency anemia, unspecified: Secondary | ICD-10-CM | POA: Diagnosis not present

## 2016-12-05 DIAGNOSIS — D509 Iron deficiency anemia, unspecified: Secondary | ICD-10-CM | POA: Diagnosis not present

## 2016-12-05 DIAGNOSIS — D631 Anemia in chronic kidney disease: Secondary | ICD-10-CM | POA: Diagnosis not present

## 2016-12-05 DIAGNOSIS — Z992 Dependence on renal dialysis: Secondary | ICD-10-CM | POA: Diagnosis not present

## 2016-12-05 DIAGNOSIS — N186 End stage renal disease: Secondary | ICD-10-CM | POA: Diagnosis not present

## 2016-12-06 DIAGNOSIS — N186 End stage renal disease: Secondary | ICD-10-CM | POA: Diagnosis not present

## 2016-12-06 DIAGNOSIS — D631 Anemia in chronic kidney disease: Secondary | ICD-10-CM | POA: Diagnosis not present

## 2016-12-06 DIAGNOSIS — D509 Iron deficiency anemia, unspecified: Secondary | ICD-10-CM | POA: Diagnosis not present

## 2016-12-06 DIAGNOSIS — Z992 Dependence on renal dialysis: Secondary | ICD-10-CM | POA: Diagnosis not present

## 2016-12-07 DIAGNOSIS — D509 Iron deficiency anemia, unspecified: Secondary | ICD-10-CM | POA: Diagnosis not present

## 2016-12-07 DIAGNOSIS — Z992 Dependence on renal dialysis: Secondary | ICD-10-CM | POA: Diagnosis not present

## 2016-12-07 DIAGNOSIS — N186 End stage renal disease: Secondary | ICD-10-CM | POA: Diagnosis not present

## 2016-12-07 DIAGNOSIS — D631 Anemia in chronic kidney disease: Secondary | ICD-10-CM | POA: Diagnosis not present

## 2016-12-08 DIAGNOSIS — N186 End stage renal disease: Secondary | ICD-10-CM | POA: Diagnosis not present

## 2016-12-08 DIAGNOSIS — D631 Anemia in chronic kidney disease: Secondary | ICD-10-CM | POA: Diagnosis not present

## 2016-12-08 DIAGNOSIS — Z992 Dependence on renal dialysis: Secondary | ICD-10-CM | POA: Diagnosis not present

## 2016-12-08 DIAGNOSIS — D509 Iron deficiency anemia, unspecified: Secondary | ICD-10-CM | POA: Diagnosis not present

## 2016-12-09 DIAGNOSIS — Z992 Dependence on renal dialysis: Secondary | ICD-10-CM | POA: Diagnosis not present

## 2016-12-09 DIAGNOSIS — N186 End stage renal disease: Secondary | ICD-10-CM | POA: Diagnosis not present

## 2016-12-09 DIAGNOSIS — D631 Anemia in chronic kidney disease: Secondary | ICD-10-CM | POA: Diagnosis not present

## 2016-12-09 DIAGNOSIS — D509 Iron deficiency anemia, unspecified: Secondary | ICD-10-CM | POA: Diagnosis not present

## 2016-12-10 DIAGNOSIS — D509 Iron deficiency anemia, unspecified: Secondary | ICD-10-CM | POA: Diagnosis not present

## 2016-12-10 DIAGNOSIS — N186 End stage renal disease: Secondary | ICD-10-CM | POA: Diagnosis not present

## 2016-12-10 DIAGNOSIS — Z992 Dependence on renal dialysis: Secondary | ICD-10-CM | POA: Diagnosis not present

## 2016-12-10 DIAGNOSIS — D631 Anemia in chronic kidney disease: Secondary | ICD-10-CM | POA: Diagnosis not present

## 2016-12-11 DIAGNOSIS — D631 Anemia in chronic kidney disease: Secondary | ICD-10-CM | POA: Diagnosis not present

## 2016-12-11 DIAGNOSIS — N186 End stage renal disease: Secondary | ICD-10-CM | POA: Diagnosis not present

## 2016-12-11 DIAGNOSIS — D509 Iron deficiency anemia, unspecified: Secondary | ICD-10-CM | POA: Diagnosis not present

## 2016-12-11 DIAGNOSIS — Z992 Dependence on renal dialysis: Secondary | ICD-10-CM | POA: Diagnosis not present

## 2016-12-12 DIAGNOSIS — Z992 Dependence on renal dialysis: Secondary | ICD-10-CM | POA: Diagnosis not present

## 2016-12-12 DIAGNOSIS — D631 Anemia in chronic kidney disease: Secondary | ICD-10-CM | POA: Diagnosis not present

## 2016-12-12 DIAGNOSIS — D509 Iron deficiency anemia, unspecified: Secondary | ICD-10-CM | POA: Diagnosis not present

## 2016-12-12 DIAGNOSIS — N186 End stage renal disease: Secondary | ICD-10-CM | POA: Diagnosis not present

## 2016-12-13 DIAGNOSIS — Z992 Dependence on renal dialysis: Secondary | ICD-10-CM | POA: Diagnosis not present

## 2016-12-13 DIAGNOSIS — D631 Anemia in chronic kidney disease: Secondary | ICD-10-CM | POA: Diagnosis not present

## 2016-12-13 DIAGNOSIS — D509 Iron deficiency anemia, unspecified: Secondary | ICD-10-CM | POA: Diagnosis not present

## 2016-12-13 DIAGNOSIS — N186 End stage renal disease: Secondary | ICD-10-CM | POA: Diagnosis not present

## 2016-12-14 DIAGNOSIS — N186 End stage renal disease: Secondary | ICD-10-CM | POA: Diagnosis not present

## 2016-12-14 DIAGNOSIS — Z992 Dependence on renal dialysis: Secondary | ICD-10-CM | POA: Diagnosis not present

## 2016-12-14 DIAGNOSIS — D509 Iron deficiency anemia, unspecified: Secondary | ICD-10-CM | POA: Diagnosis not present

## 2016-12-14 DIAGNOSIS — D631 Anemia in chronic kidney disease: Secondary | ICD-10-CM | POA: Diagnosis not present

## 2016-12-15 DIAGNOSIS — Z992 Dependence on renal dialysis: Secondary | ICD-10-CM | POA: Diagnosis not present

## 2016-12-15 DIAGNOSIS — N186 End stage renal disease: Secondary | ICD-10-CM | POA: Diagnosis not present

## 2016-12-15 DIAGNOSIS — D509 Iron deficiency anemia, unspecified: Secondary | ICD-10-CM | POA: Diagnosis not present

## 2016-12-15 DIAGNOSIS — D631 Anemia in chronic kidney disease: Secondary | ICD-10-CM | POA: Diagnosis not present

## 2016-12-16 DIAGNOSIS — Z992 Dependence on renal dialysis: Secondary | ICD-10-CM | POA: Diagnosis not present

## 2016-12-16 DIAGNOSIS — D509 Iron deficiency anemia, unspecified: Secondary | ICD-10-CM | POA: Diagnosis not present

## 2016-12-16 DIAGNOSIS — N186 End stage renal disease: Secondary | ICD-10-CM | POA: Diagnosis not present

## 2016-12-16 DIAGNOSIS — D631 Anemia in chronic kidney disease: Secondary | ICD-10-CM | POA: Diagnosis not present

## 2016-12-17 DIAGNOSIS — N186 End stage renal disease: Secondary | ICD-10-CM | POA: Diagnosis not present

## 2016-12-17 DIAGNOSIS — Z992 Dependence on renal dialysis: Secondary | ICD-10-CM | POA: Diagnosis not present

## 2016-12-17 DIAGNOSIS — D631 Anemia in chronic kidney disease: Secondary | ICD-10-CM | POA: Diagnosis not present

## 2016-12-17 DIAGNOSIS — D509 Iron deficiency anemia, unspecified: Secondary | ICD-10-CM | POA: Diagnosis not present

## 2016-12-18 DIAGNOSIS — Z992 Dependence on renal dialysis: Secondary | ICD-10-CM | POA: Diagnosis not present

## 2016-12-18 DIAGNOSIS — D631 Anemia in chronic kidney disease: Secondary | ICD-10-CM | POA: Diagnosis not present

## 2016-12-18 DIAGNOSIS — N186 End stage renal disease: Secondary | ICD-10-CM | POA: Diagnosis not present

## 2016-12-18 DIAGNOSIS — D509 Iron deficiency anemia, unspecified: Secondary | ICD-10-CM | POA: Diagnosis not present

## 2016-12-19 DIAGNOSIS — D509 Iron deficiency anemia, unspecified: Secondary | ICD-10-CM | POA: Diagnosis not present

## 2016-12-19 DIAGNOSIS — Z992 Dependence on renal dialysis: Secondary | ICD-10-CM | POA: Diagnosis not present

## 2016-12-19 DIAGNOSIS — N186 End stage renal disease: Secondary | ICD-10-CM | POA: Diagnosis not present

## 2016-12-19 DIAGNOSIS — D631 Anemia in chronic kidney disease: Secondary | ICD-10-CM | POA: Diagnosis not present

## 2016-12-20 DIAGNOSIS — Z992 Dependence on renal dialysis: Secondary | ICD-10-CM | POA: Diagnosis not present

## 2016-12-20 DIAGNOSIS — D631 Anemia in chronic kidney disease: Secondary | ICD-10-CM | POA: Diagnosis not present

## 2016-12-20 DIAGNOSIS — D509 Iron deficiency anemia, unspecified: Secondary | ICD-10-CM | POA: Diagnosis not present

## 2016-12-20 DIAGNOSIS — N186 End stage renal disease: Secondary | ICD-10-CM | POA: Diagnosis not present

## 2016-12-21 DIAGNOSIS — D509 Iron deficiency anemia, unspecified: Secondary | ICD-10-CM | POA: Diagnosis not present

## 2016-12-21 DIAGNOSIS — N186 End stage renal disease: Secondary | ICD-10-CM | POA: Diagnosis not present

## 2016-12-21 DIAGNOSIS — Z992 Dependence on renal dialysis: Secondary | ICD-10-CM | POA: Diagnosis not present

## 2016-12-21 DIAGNOSIS — D631 Anemia in chronic kidney disease: Secondary | ICD-10-CM | POA: Diagnosis not present

## 2016-12-22 DIAGNOSIS — D631 Anemia in chronic kidney disease: Secondary | ICD-10-CM | POA: Diagnosis not present

## 2016-12-22 DIAGNOSIS — D509 Iron deficiency anemia, unspecified: Secondary | ICD-10-CM | POA: Diagnosis not present

## 2016-12-22 DIAGNOSIS — N186 End stage renal disease: Secondary | ICD-10-CM | POA: Diagnosis not present

## 2016-12-22 DIAGNOSIS — Z992 Dependence on renal dialysis: Secondary | ICD-10-CM | POA: Diagnosis not present

## 2016-12-23 DIAGNOSIS — D631 Anemia in chronic kidney disease: Secondary | ICD-10-CM | POA: Diagnosis not present

## 2016-12-23 DIAGNOSIS — N186 End stage renal disease: Secondary | ICD-10-CM | POA: Diagnosis not present

## 2016-12-23 DIAGNOSIS — D509 Iron deficiency anemia, unspecified: Secondary | ICD-10-CM | POA: Diagnosis not present

## 2016-12-23 DIAGNOSIS — Z992 Dependence on renal dialysis: Secondary | ICD-10-CM | POA: Diagnosis not present

## 2016-12-24 DIAGNOSIS — N186 End stage renal disease: Secondary | ICD-10-CM | POA: Diagnosis not present

## 2016-12-24 DIAGNOSIS — Z992 Dependence on renal dialysis: Secondary | ICD-10-CM | POA: Diagnosis not present

## 2016-12-24 DIAGNOSIS — D509 Iron deficiency anemia, unspecified: Secondary | ICD-10-CM | POA: Diagnosis not present

## 2016-12-24 DIAGNOSIS — D631 Anemia in chronic kidney disease: Secondary | ICD-10-CM | POA: Diagnosis not present

## 2016-12-25 DIAGNOSIS — D509 Iron deficiency anemia, unspecified: Secondary | ICD-10-CM | POA: Diagnosis not present

## 2016-12-25 DIAGNOSIS — N186 End stage renal disease: Secondary | ICD-10-CM | POA: Diagnosis not present

## 2016-12-25 DIAGNOSIS — D631 Anemia in chronic kidney disease: Secondary | ICD-10-CM | POA: Diagnosis not present

## 2016-12-25 DIAGNOSIS — Z992 Dependence on renal dialysis: Secondary | ICD-10-CM | POA: Diagnosis not present

## 2016-12-26 DIAGNOSIS — N186 End stage renal disease: Secondary | ICD-10-CM | POA: Diagnosis not present

## 2016-12-26 DIAGNOSIS — D631 Anemia in chronic kidney disease: Secondary | ICD-10-CM | POA: Diagnosis not present

## 2016-12-26 DIAGNOSIS — Z992 Dependence on renal dialysis: Secondary | ICD-10-CM | POA: Diagnosis not present

## 2016-12-26 DIAGNOSIS — D509 Iron deficiency anemia, unspecified: Secondary | ICD-10-CM | POA: Diagnosis not present

## 2016-12-27 DIAGNOSIS — D631 Anemia in chronic kidney disease: Secondary | ICD-10-CM | POA: Diagnosis not present

## 2016-12-27 DIAGNOSIS — D509 Iron deficiency anemia, unspecified: Secondary | ICD-10-CM | POA: Diagnosis not present

## 2016-12-27 DIAGNOSIS — N186 End stage renal disease: Secondary | ICD-10-CM | POA: Diagnosis not present

## 2016-12-27 DIAGNOSIS — Z992 Dependence on renal dialysis: Secondary | ICD-10-CM | POA: Diagnosis not present

## 2016-12-28 DIAGNOSIS — D631 Anemia in chronic kidney disease: Secondary | ICD-10-CM | POA: Diagnosis not present

## 2016-12-28 DIAGNOSIS — D509 Iron deficiency anemia, unspecified: Secondary | ICD-10-CM | POA: Diagnosis not present

## 2016-12-28 DIAGNOSIS — N186 End stage renal disease: Secondary | ICD-10-CM | POA: Diagnosis not present

## 2016-12-28 DIAGNOSIS — Z992 Dependence on renal dialysis: Secondary | ICD-10-CM | POA: Diagnosis not present

## 2016-12-29 DIAGNOSIS — D509 Iron deficiency anemia, unspecified: Secondary | ICD-10-CM | POA: Diagnosis not present

## 2016-12-29 DIAGNOSIS — D631 Anemia in chronic kidney disease: Secondary | ICD-10-CM | POA: Diagnosis not present

## 2016-12-29 DIAGNOSIS — N186 End stage renal disease: Secondary | ICD-10-CM | POA: Diagnosis not present

## 2016-12-29 DIAGNOSIS — Z992 Dependence on renal dialysis: Secondary | ICD-10-CM | POA: Diagnosis not present

## 2016-12-30 DIAGNOSIS — D509 Iron deficiency anemia, unspecified: Secondary | ICD-10-CM | POA: Diagnosis not present

## 2016-12-30 DIAGNOSIS — D631 Anemia in chronic kidney disease: Secondary | ICD-10-CM | POA: Diagnosis not present

## 2016-12-30 DIAGNOSIS — Z992 Dependence on renal dialysis: Secondary | ICD-10-CM | POA: Diagnosis not present

## 2016-12-30 DIAGNOSIS — N186 End stage renal disease: Secondary | ICD-10-CM | POA: Diagnosis not present

## 2016-12-31 DIAGNOSIS — D509 Iron deficiency anemia, unspecified: Secondary | ICD-10-CM | POA: Diagnosis not present

## 2016-12-31 DIAGNOSIS — D631 Anemia in chronic kidney disease: Secondary | ICD-10-CM | POA: Diagnosis not present

## 2016-12-31 DIAGNOSIS — N186 End stage renal disease: Secondary | ICD-10-CM | POA: Diagnosis not present

## 2016-12-31 DIAGNOSIS — Z992 Dependence on renal dialysis: Secondary | ICD-10-CM | POA: Diagnosis not present

## 2017-01-01 DIAGNOSIS — D631 Anemia in chronic kidney disease: Secondary | ICD-10-CM | POA: Diagnosis not present

## 2017-01-01 DIAGNOSIS — Z992 Dependence on renal dialysis: Secondary | ICD-10-CM | POA: Diagnosis not present

## 2017-01-01 DIAGNOSIS — D509 Iron deficiency anemia, unspecified: Secondary | ICD-10-CM | POA: Diagnosis not present

## 2017-01-01 DIAGNOSIS — N186 End stage renal disease: Secondary | ICD-10-CM | POA: Diagnosis not present

## 2017-01-02 DIAGNOSIS — D509 Iron deficiency anemia, unspecified: Secondary | ICD-10-CM | POA: Diagnosis not present

## 2017-01-02 DIAGNOSIS — D631 Anemia in chronic kidney disease: Secondary | ICD-10-CM | POA: Diagnosis not present

## 2017-01-02 DIAGNOSIS — N186 End stage renal disease: Secondary | ICD-10-CM | POA: Diagnosis not present

## 2017-01-02 DIAGNOSIS — Z992 Dependence on renal dialysis: Secondary | ICD-10-CM | POA: Diagnosis not present

## 2017-01-03 DIAGNOSIS — D509 Iron deficiency anemia, unspecified: Secondary | ICD-10-CM | POA: Diagnosis not present

## 2017-01-03 DIAGNOSIS — D631 Anemia in chronic kidney disease: Secondary | ICD-10-CM | POA: Diagnosis not present

## 2017-01-03 DIAGNOSIS — Z992 Dependence on renal dialysis: Secondary | ICD-10-CM | POA: Diagnosis not present

## 2017-01-03 DIAGNOSIS — N186 End stage renal disease: Secondary | ICD-10-CM | POA: Diagnosis not present

## 2017-01-04 DIAGNOSIS — N186 End stage renal disease: Secondary | ICD-10-CM | POA: Diagnosis not present

## 2017-01-04 DIAGNOSIS — D509 Iron deficiency anemia, unspecified: Secondary | ICD-10-CM | POA: Diagnosis not present

## 2017-01-04 DIAGNOSIS — D631 Anemia in chronic kidney disease: Secondary | ICD-10-CM | POA: Diagnosis not present

## 2017-01-04 DIAGNOSIS — Z992 Dependence on renal dialysis: Secondary | ICD-10-CM | POA: Diagnosis not present

## 2017-01-05 DIAGNOSIS — D509 Iron deficiency anemia, unspecified: Secondary | ICD-10-CM | POA: Diagnosis not present

## 2017-01-05 DIAGNOSIS — D631 Anemia in chronic kidney disease: Secondary | ICD-10-CM | POA: Diagnosis not present

## 2017-01-05 DIAGNOSIS — Z992 Dependence on renal dialysis: Secondary | ICD-10-CM | POA: Diagnosis not present

## 2017-01-05 DIAGNOSIS — N186 End stage renal disease: Secondary | ICD-10-CM | POA: Diagnosis not present

## 2017-01-06 DIAGNOSIS — D509 Iron deficiency anemia, unspecified: Secondary | ICD-10-CM | POA: Diagnosis not present

## 2017-01-06 DIAGNOSIS — Z992 Dependence on renal dialysis: Secondary | ICD-10-CM | POA: Diagnosis not present

## 2017-01-06 DIAGNOSIS — N186 End stage renal disease: Secondary | ICD-10-CM | POA: Diagnosis not present

## 2017-01-06 DIAGNOSIS — D631 Anemia in chronic kidney disease: Secondary | ICD-10-CM | POA: Diagnosis not present

## 2017-01-07 DIAGNOSIS — D509 Iron deficiency anemia, unspecified: Secondary | ICD-10-CM | POA: Diagnosis not present

## 2017-01-07 DIAGNOSIS — N186 End stage renal disease: Secondary | ICD-10-CM | POA: Diagnosis not present

## 2017-01-07 DIAGNOSIS — D631 Anemia in chronic kidney disease: Secondary | ICD-10-CM | POA: Diagnosis not present

## 2017-01-07 DIAGNOSIS — Z992 Dependence on renal dialysis: Secondary | ICD-10-CM | POA: Diagnosis not present

## 2017-01-08 DIAGNOSIS — N186 End stage renal disease: Secondary | ICD-10-CM | POA: Diagnosis not present

## 2017-01-08 DIAGNOSIS — Z992 Dependence on renal dialysis: Secondary | ICD-10-CM | POA: Diagnosis not present

## 2017-01-08 DIAGNOSIS — D631 Anemia in chronic kidney disease: Secondary | ICD-10-CM | POA: Diagnosis not present

## 2017-01-08 DIAGNOSIS — D509 Iron deficiency anemia, unspecified: Secondary | ICD-10-CM | POA: Diagnosis not present

## 2017-01-09 DIAGNOSIS — D631 Anemia in chronic kidney disease: Secondary | ICD-10-CM | POA: Diagnosis not present

## 2017-01-09 DIAGNOSIS — Z992 Dependence on renal dialysis: Secondary | ICD-10-CM | POA: Diagnosis not present

## 2017-01-09 DIAGNOSIS — N186 End stage renal disease: Secondary | ICD-10-CM | POA: Diagnosis not present

## 2017-01-09 DIAGNOSIS — D509 Iron deficiency anemia, unspecified: Secondary | ICD-10-CM | POA: Diagnosis not present

## 2017-01-10 DIAGNOSIS — N186 End stage renal disease: Secondary | ICD-10-CM | POA: Diagnosis not present

## 2017-01-10 DIAGNOSIS — D509 Iron deficiency anemia, unspecified: Secondary | ICD-10-CM | POA: Diagnosis not present

## 2017-01-10 DIAGNOSIS — Z992 Dependence on renal dialysis: Secondary | ICD-10-CM | POA: Diagnosis not present

## 2017-01-10 DIAGNOSIS — D631 Anemia in chronic kidney disease: Secondary | ICD-10-CM | POA: Diagnosis not present

## 2017-01-11 DIAGNOSIS — D509 Iron deficiency anemia, unspecified: Secondary | ICD-10-CM | POA: Diagnosis not present

## 2017-01-11 DIAGNOSIS — Z992 Dependence on renal dialysis: Secondary | ICD-10-CM | POA: Diagnosis not present

## 2017-01-11 DIAGNOSIS — N186 End stage renal disease: Secondary | ICD-10-CM | POA: Diagnosis not present

## 2017-01-11 DIAGNOSIS — D631 Anemia in chronic kidney disease: Secondary | ICD-10-CM | POA: Diagnosis not present

## 2017-01-12 DIAGNOSIS — D631 Anemia in chronic kidney disease: Secondary | ICD-10-CM | POA: Diagnosis not present

## 2017-01-12 DIAGNOSIS — Z992 Dependence on renal dialysis: Secondary | ICD-10-CM | POA: Diagnosis not present

## 2017-01-12 DIAGNOSIS — N186 End stage renal disease: Secondary | ICD-10-CM | POA: Diagnosis not present

## 2017-01-12 DIAGNOSIS — D509 Iron deficiency anemia, unspecified: Secondary | ICD-10-CM | POA: Diagnosis not present

## 2017-01-13 DIAGNOSIS — D509 Iron deficiency anemia, unspecified: Secondary | ICD-10-CM | POA: Diagnosis not present

## 2017-01-13 DIAGNOSIS — Z992 Dependence on renal dialysis: Secondary | ICD-10-CM | POA: Diagnosis not present

## 2017-01-13 DIAGNOSIS — N186 End stage renal disease: Secondary | ICD-10-CM | POA: Diagnosis not present

## 2017-01-13 DIAGNOSIS — D631 Anemia in chronic kidney disease: Secondary | ICD-10-CM | POA: Diagnosis not present

## 2017-01-14 DIAGNOSIS — D631 Anemia in chronic kidney disease: Secondary | ICD-10-CM | POA: Diagnosis not present

## 2017-01-14 DIAGNOSIS — Z992 Dependence on renal dialysis: Secondary | ICD-10-CM | POA: Diagnosis not present

## 2017-01-14 DIAGNOSIS — N186 End stage renal disease: Secondary | ICD-10-CM | POA: Diagnosis not present

## 2017-01-14 DIAGNOSIS — D509 Iron deficiency anemia, unspecified: Secondary | ICD-10-CM | POA: Diagnosis not present

## 2017-01-15 DIAGNOSIS — D509 Iron deficiency anemia, unspecified: Secondary | ICD-10-CM | POA: Diagnosis not present

## 2017-01-15 DIAGNOSIS — Z992 Dependence on renal dialysis: Secondary | ICD-10-CM | POA: Diagnosis not present

## 2017-01-15 DIAGNOSIS — D631 Anemia in chronic kidney disease: Secondary | ICD-10-CM | POA: Diagnosis not present

## 2017-01-15 DIAGNOSIS — N186 End stage renal disease: Secondary | ICD-10-CM | POA: Diagnosis not present

## 2017-01-16 DIAGNOSIS — D631 Anemia in chronic kidney disease: Secondary | ICD-10-CM | POA: Diagnosis not present

## 2017-01-16 DIAGNOSIS — Z992 Dependence on renal dialysis: Secondary | ICD-10-CM | POA: Diagnosis not present

## 2017-01-16 DIAGNOSIS — N186 End stage renal disease: Secondary | ICD-10-CM | POA: Diagnosis not present

## 2017-01-16 DIAGNOSIS — D509 Iron deficiency anemia, unspecified: Secondary | ICD-10-CM | POA: Diagnosis not present

## 2017-01-17 DIAGNOSIS — Z992 Dependence on renal dialysis: Secondary | ICD-10-CM | POA: Diagnosis not present

## 2017-01-17 DIAGNOSIS — N186 End stage renal disease: Secondary | ICD-10-CM | POA: Diagnosis not present

## 2017-01-17 DIAGNOSIS — D509 Iron deficiency anemia, unspecified: Secondary | ICD-10-CM | POA: Diagnosis not present

## 2017-01-17 DIAGNOSIS — D631 Anemia in chronic kidney disease: Secondary | ICD-10-CM | POA: Diagnosis not present

## 2017-01-18 DIAGNOSIS — D631 Anemia in chronic kidney disease: Secondary | ICD-10-CM | POA: Diagnosis not present

## 2017-01-18 DIAGNOSIS — D509 Iron deficiency anemia, unspecified: Secondary | ICD-10-CM | POA: Diagnosis not present

## 2017-01-18 DIAGNOSIS — Z992 Dependence on renal dialysis: Secondary | ICD-10-CM | POA: Diagnosis not present

## 2017-01-18 DIAGNOSIS — N186 End stage renal disease: Secondary | ICD-10-CM | POA: Diagnosis not present

## 2017-01-19 DIAGNOSIS — D509 Iron deficiency anemia, unspecified: Secondary | ICD-10-CM | POA: Diagnosis not present

## 2017-01-19 DIAGNOSIS — N186 End stage renal disease: Secondary | ICD-10-CM | POA: Diagnosis not present

## 2017-01-19 DIAGNOSIS — Z992 Dependence on renal dialysis: Secondary | ICD-10-CM | POA: Diagnosis not present

## 2017-01-19 DIAGNOSIS — D631 Anemia in chronic kidney disease: Secondary | ICD-10-CM | POA: Diagnosis not present

## 2017-01-20 DIAGNOSIS — D509 Iron deficiency anemia, unspecified: Secondary | ICD-10-CM | POA: Diagnosis not present

## 2017-01-20 DIAGNOSIS — Z992 Dependence on renal dialysis: Secondary | ICD-10-CM | POA: Diagnosis not present

## 2017-01-20 DIAGNOSIS — N186 End stage renal disease: Secondary | ICD-10-CM | POA: Diagnosis not present

## 2017-01-20 DIAGNOSIS — D631 Anemia in chronic kidney disease: Secondary | ICD-10-CM | POA: Diagnosis not present

## 2017-01-21 DIAGNOSIS — Z992 Dependence on renal dialysis: Secondary | ICD-10-CM | POA: Diagnosis not present

## 2017-01-21 DIAGNOSIS — D509 Iron deficiency anemia, unspecified: Secondary | ICD-10-CM | POA: Diagnosis not present

## 2017-01-21 DIAGNOSIS — N186 End stage renal disease: Secondary | ICD-10-CM | POA: Diagnosis not present

## 2017-01-21 DIAGNOSIS — D631 Anemia in chronic kidney disease: Secondary | ICD-10-CM | POA: Diagnosis not present

## 2017-01-22 DIAGNOSIS — D631 Anemia in chronic kidney disease: Secondary | ICD-10-CM | POA: Diagnosis not present

## 2017-01-22 DIAGNOSIS — Z992 Dependence on renal dialysis: Secondary | ICD-10-CM | POA: Diagnosis not present

## 2017-01-22 DIAGNOSIS — N186 End stage renal disease: Secondary | ICD-10-CM | POA: Diagnosis not present

## 2017-01-22 DIAGNOSIS — D509 Iron deficiency anemia, unspecified: Secondary | ICD-10-CM | POA: Diagnosis not present

## 2017-01-23 DIAGNOSIS — Z992 Dependence on renal dialysis: Secondary | ICD-10-CM | POA: Diagnosis not present

## 2017-01-23 DIAGNOSIS — D631 Anemia in chronic kidney disease: Secondary | ICD-10-CM | POA: Diagnosis not present

## 2017-01-23 DIAGNOSIS — N186 End stage renal disease: Secondary | ICD-10-CM | POA: Diagnosis not present

## 2017-01-23 DIAGNOSIS — D509 Iron deficiency anemia, unspecified: Secondary | ICD-10-CM | POA: Diagnosis not present

## 2017-01-24 DIAGNOSIS — Z992 Dependence on renal dialysis: Secondary | ICD-10-CM | POA: Diagnosis not present

## 2017-01-24 DIAGNOSIS — N186 End stage renal disease: Secondary | ICD-10-CM | POA: Diagnosis not present

## 2017-01-24 DIAGNOSIS — D509 Iron deficiency anemia, unspecified: Secondary | ICD-10-CM | POA: Diagnosis not present

## 2017-01-24 DIAGNOSIS — D631 Anemia in chronic kidney disease: Secondary | ICD-10-CM | POA: Diagnosis not present

## 2017-01-25 DIAGNOSIS — D509 Iron deficiency anemia, unspecified: Secondary | ICD-10-CM | POA: Diagnosis not present

## 2017-01-25 DIAGNOSIS — D631 Anemia in chronic kidney disease: Secondary | ICD-10-CM | POA: Diagnosis not present

## 2017-01-25 DIAGNOSIS — N186 End stage renal disease: Secondary | ICD-10-CM | POA: Diagnosis not present

## 2017-01-25 DIAGNOSIS — Z992 Dependence on renal dialysis: Secondary | ICD-10-CM | POA: Diagnosis not present

## 2017-01-26 DIAGNOSIS — D509 Iron deficiency anemia, unspecified: Secondary | ICD-10-CM | POA: Diagnosis not present

## 2017-01-26 DIAGNOSIS — D631 Anemia in chronic kidney disease: Secondary | ICD-10-CM | POA: Diagnosis not present

## 2017-01-26 DIAGNOSIS — Z992 Dependence on renal dialysis: Secondary | ICD-10-CM | POA: Diagnosis not present

## 2017-01-26 DIAGNOSIS — N186 End stage renal disease: Secondary | ICD-10-CM | POA: Diagnosis not present

## 2017-01-27 DIAGNOSIS — N186 End stage renal disease: Secondary | ICD-10-CM | POA: Diagnosis not present

## 2017-01-27 DIAGNOSIS — Z992 Dependence on renal dialysis: Secondary | ICD-10-CM | POA: Diagnosis not present

## 2017-01-27 DIAGNOSIS — D631 Anemia in chronic kidney disease: Secondary | ICD-10-CM | POA: Diagnosis not present

## 2017-01-27 DIAGNOSIS — D509 Iron deficiency anemia, unspecified: Secondary | ICD-10-CM | POA: Diagnosis not present

## 2017-01-28 DIAGNOSIS — N186 End stage renal disease: Secondary | ICD-10-CM | POA: Diagnosis not present

## 2017-01-28 DIAGNOSIS — D631 Anemia in chronic kidney disease: Secondary | ICD-10-CM | POA: Diagnosis not present

## 2017-01-28 DIAGNOSIS — Z992 Dependence on renal dialysis: Secondary | ICD-10-CM | POA: Diagnosis not present

## 2017-01-28 DIAGNOSIS — D509 Iron deficiency anemia, unspecified: Secondary | ICD-10-CM | POA: Diagnosis not present

## 2017-01-29 DIAGNOSIS — D509 Iron deficiency anemia, unspecified: Secondary | ICD-10-CM | POA: Diagnosis not present

## 2017-01-29 DIAGNOSIS — Z992 Dependence on renal dialysis: Secondary | ICD-10-CM | POA: Diagnosis not present

## 2017-01-29 DIAGNOSIS — D631 Anemia in chronic kidney disease: Secondary | ICD-10-CM | POA: Diagnosis not present

## 2017-01-29 DIAGNOSIS — N186 End stage renal disease: Secondary | ICD-10-CM | POA: Diagnosis not present

## 2017-01-30 DIAGNOSIS — D509 Iron deficiency anemia, unspecified: Secondary | ICD-10-CM | POA: Diagnosis not present

## 2017-01-30 DIAGNOSIS — D631 Anemia in chronic kidney disease: Secondary | ICD-10-CM | POA: Diagnosis not present

## 2017-01-30 DIAGNOSIS — N186 End stage renal disease: Secondary | ICD-10-CM | POA: Diagnosis not present

## 2017-01-30 DIAGNOSIS — Z992 Dependence on renal dialysis: Secondary | ICD-10-CM | POA: Diagnosis not present

## 2017-01-31 DIAGNOSIS — N186 End stage renal disease: Secondary | ICD-10-CM | POA: Diagnosis not present

## 2017-01-31 DIAGNOSIS — D631 Anemia in chronic kidney disease: Secondary | ICD-10-CM | POA: Diagnosis not present

## 2017-01-31 DIAGNOSIS — Z992 Dependence on renal dialysis: Secondary | ICD-10-CM | POA: Diagnosis not present

## 2017-01-31 DIAGNOSIS — D509 Iron deficiency anemia, unspecified: Secondary | ICD-10-CM | POA: Diagnosis not present

## 2017-02-01 DIAGNOSIS — D631 Anemia in chronic kidney disease: Secondary | ICD-10-CM | POA: Diagnosis not present

## 2017-02-01 DIAGNOSIS — D509 Iron deficiency anemia, unspecified: Secondary | ICD-10-CM | POA: Diagnosis not present

## 2017-02-01 DIAGNOSIS — N186 End stage renal disease: Secondary | ICD-10-CM | POA: Diagnosis not present

## 2017-02-01 DIAGNOSIS — Z992 Dependence on renal dialysis: Secondary | ICD-10-CM | POA: Diagnosis not present

## 2017-02-02 DIAGNOSIS — D509 Iron deficiency anemia, unspecified: Secondary | ICD-10-CM | POA: Diagnosis not present

## 2017-02-02 DIAGNOSIS — D631 Anemia in chronic kidney disease: Secondary | ICD-10-CM | POA: Diagnosis not present

## 2017-02-02 DIAGNOSIS — N186 End stage renal disease: Secondary | ICD-10-CM | POA: Diagnosis not present

## 2017-02-02 DIAGNOSIS — Z992 Dependence on renal dialysis: Secondary | ICD-10-CM | POA: Diagnosis not present

## 2017-02-03 DIAGNOSIS — D509 Iron deficiency anemia, unspecified: Secondary | ICD-10-CM | POA: Diagnosis not present

## 2017-02-03 DIAGNOSIS — D631 Anemia in chronic kidney disease: Secondary | ICD-10-CM | POA: Diagnosis not present

## 2017-02-03 DIAGNOSIS — N186 End stage renal disease: Secondary | ICD-10-CM | POA: Diagnosis not present

## 2017-02-03 DIAGNOSIS — Z992 Dependence on renal dialysis: Secondary | ICD-10-CM | POA: Diagnosis not present

## 2017-02-04 DIAGNOSIS — Z992 Dependence on renal dialysis: Secondary | ICD-10-CM | POA: Diagnosis not present

## 2017-02-04 DIAGNOSIS — D631 Anemia in chronic kidney disease: Secondary | ICD-10-CM | POA: Diagnosis not present

## 2017-02-04 DIAGNOSIS — N186 End stage renal disease: Secondary | ICD-10-CM | POA: Diagnosis not present

## 2017-02-04 DIAGNOSIS — D509 Iron deficiency anemia, unspecified: Secondary | ICD-10-CM | POA: Diagnosis not present

## 2017-02-05 DIAGNOSIS — Z992 Dependence on renal dialysis: Secondary | ICD-10-CM | POA: Diagnosis not present

## 2017-02-05 DIAGNOSIS — D631 Anemia in chronic kidney disease: Secondary | ICD-10-CM | POA: Diagnosis not present

## 2017-02-05 DIAGNOSIS — N186 End stage renal disease: Secondary | ICD-10-CM | POA: Diagnosis not present

## 2017-02-05 DIAGNOSIS — D509 Iron deficiency anemia, unspecified: Secondary | ICD-10-CM | POA: Diagnosis not present

## 2017-02-06 DIAGNOSIS — D631 Anemia in chronic kidney disease: Secondary | ICD-10-CM | POA: Diagnosis not present

## 2017-02-06 DIAGNOSIS — Z992 Dependence on renal dialysis: Secondary | ICD-10-CM | POA: Diagnosis not present

## 2017-02-06 DIAGNOSIS — D509 Iron deficiency anemia, unspecified: Secondary | ICD-10-CM | POA: Diagnosis not present

## 2017-02-06 DIAGNOSIS — N186 End stage renal disease: Secondary | ICD-10-CM | POA: Diagnosis not present

## 2017-02-07 DIAGNOSIS — D631 Anemia in chronic kidney disease: Secondary | ICD-10-CM | POA: Diagnosis not present

## 2017-02-07 DIAGNOSIS — Z992 Dependence on renal dialysis: Secondary | ICD-10-CM | POA: Diagnosis not present

## 2017-02-07 DIAGNOSIS — D509 Iron deficiency anemia, unspecified: Secondary | ICD-10-CM | POA: Diagnosis not present

## 2017-02-07 DIAGNOSIS — N186 End stage renal disease: Secondary | ICD-10-CM | POA: Diagnosis not present

## 2017-02-08 DIAGNOSIS — D509 Iron deficiency anemia, unspecified: Secondary | ICD-10-CM | POA: Diagnosis not present

## 2017-02-08 DIAGNOSIS — N186 End stage renal disease: Secondary | ICD-10-CM | POA: Diagnosis not present

## 2017-02-08 DIAGNOSIS — Z992 Dependence on renal dialysis: Secondary | ICD-10-CM | POA: Diagnosis not present

## 2017-02-08 DIAGNOSIS — D631 Anemia in chronic kidney disease: Secondary | ICD-10-CM | POA: Diagnosis not present

## 2017-02-09 DIAGNOSIS — Z992 Dependence on renal dialysis: Secondary | ICD-10-CM | POA: Diagnosis not present

## 2017-02-09 DIAGNOSIS — D509 Iron deficiency anemia, unspecified: Secondary | ICD-10-CM | POA: Diagnosis not present

## 2017-02-09 DIAGNOSIS — N186 End stage renal disease: Secondary | ICD-10-CM | POA: Diagnosis not present

## 2017-02-09 DIAGNOSIS — D631 Anemia in chronic kidney disease: Secondary | ICD-10-CM | POA: Diagnosis not present

## 2017-02-10 DIAGNOSIS — D631 Anemia in chronic kidney disease: Secondary | ICD-10-CM | POA: Diagnosis not present

## 2017-02-10 DIAGNOSIS — Z992 Dependence on renal dialysis: Secondary | ICD-10-CM | POA: Diagnosis not present

## 2017-02-10 DIAGNOSIS — D509 Iron deficiency anemia, unspecified: Secondary | ICD-10-CM | POA: Diagnosis not present

## 2017-02-10 DIAGNOSIS — N186 End stage renal disease: Secondary | ICD-10-CM | POA: Diagnosis not present

## 2017-02-11 DIAGNOSIS — D509 Iron deficiency anemia, unspecified: Secondary | ICD-10-CM | POA: Diagnosis not present

## 2017-02-11 DIAGNOSIS — N186 End stage renal disease: Secondary | ICD-10-CM | POA: Diagnosis not present

## 2017-02-11 DIAGNOSIS — Z992 Dependence on renal dialysis: Secondary | ICD-10-CM | POA: Diagnosis not present

## 2017-02-11 DIAGNOSIS — D631 Anemia in chronic kidney disease: Secondary | ICD-10-CM | POA: Diagnosis not present

## 2017-02-12 DIAGNOSIS — N2581 Secondary hyperparathyroidism of renal origin: Secondary | ICD-10-CM | POA: Diagnosis not present

## 2017-02-12 DIAGNOSIS — D631 Anemia in chronic kidney disease: Secondary | ICD-10-CM | POA: Diagnosis not present

## 2017-02-12 DIAGNOSIS — D509 Iron deficiency anemia, unspecified: Secondary | ICD-10-CM | POA: Diagnosis not present

## 2017-02-12 DIAGNOSIS — Z992 Dependence on renal dialysis: Secondary | ICD-10-CM | POA: Diagnosis not present

## 2017-02-12 DIAGNOSIS — N186 End stage renal disease: Secondary | ICD-10-CM | POA: Diagnosis not present

## 2017-02-13 DIAGNOSIS — N2581 Secondary hyperparathyroidism of renal origin: Secondary | ICD-10-CM | POA: Diagnosis not present

## 2017-02-13 DIAGNOSIS — D631 Anemia in chronic kidney disease: Secondary | ICD-10-CM | POA: Diagnosis not present

## 2017-02-13 DIAGNOSIS — Z992 Dependence on renal dialysis: Secondary | ICD-10-CM | POA: Diagnosis not present

## 2017-02-13 DIAGNOSIS — D509 Iron deficiency anemia, unspecified: Secondary | ICD-10-CM | POA: Diagnosis not present

## 2017-02-13 DIAGNOSIS — N186 End stage renal disease: Secondary | ICD-10-CM | POA: Diagnosis not present

## 2017-02-14 DIAGNOSIS — D509 Iron deficiency anemia, unspecified: Secondary | ICD-10-CM | POA: Diagnosis not present

## 2017-02-14 DIAGNOSIS — Z992 Dependence on renal dialysis: Secondary | ICD-10-CM | POA: Diagnosis not present

## 2017-02-14 DIAGNOSIS — D631 Anemia in chronic kidney disease: Secondary | ICD-10-CM | POA: Diagnosis not present

## 2017-02-14 DIAGNOSIS — N186 End stage renal disease: Secondary | ICD-10-CM | POA: Diagnosis not present

## 2017-02-14 DIAGNOSIS — N2581 Secondary hyperparathyroidism of renal origin: Secondary | ICD-10-CM | POA: Diagnosis not present

## 2017-02-15 DIAGNOSIS — N2581 Secondary hyperparathyroidism of renal origin: Secondary | ICD-10-CM | POA: Diagnosis not present

## 2017-02-15 DIAGNOSIS — Z992 Dependence on renal dialysis: Secondary | ICD-10-CM | POA: Diagnosis not present

## 2017-02-15 DIAGNOSIS — D631 Anemia in chronic kidney disease: Secondary | ICD-10-CM | POA: Diagnosis not present

## 2017-02-15 DIAGNOSIS — D509 Iron deficiency anemia, unspecified: Secondary | ICD-10-CM | POA: Diagnosis not present

## 2017-02-15 DIAGNOSIS — N186 End stage renal disease: Secondary | ICD-10-CM | POA: Diagnosis not present

## 2017-02-16 DIAGNOSIS — D509 Iron deficiency anemia, unspecified: Secondary | ICD-10-CM | POA: Diagnosis not present

## 2017-02-16 DIAGNOSIS — N186 End stage renal disease: Secondary | ICD-10-CM | POA: Diagnosis not present

## 2017-02-16 DIAGNOSIS — Z992 Dependence on renal dialysis: Secondary | ICD-10-CM | POA: Diagnosis not present

## 2017-02-16 DIAGNOSIS — D631 Anemia in chronic kidney disease: Secondary | ICD-10-CM | POA: Diagnosis not present

## 2017-02-16 DIAGNOSIS — N2581 Secondary hyperparathyroidism of renal origin: Secondary | ICD-10-CM | POA: Diagnosis not present

## 2017-02-17 DIAGNOSIS — N186 End stage renal disease: Secondary | ICD-10-CM | POA: Diagnosis not present

## 2017-02-17 DIAGNOSIS — D509 Iron deficiency anemia, unspecified: Secondary | ICD-10-CM | POA: Diagnosis not present

## 2017-02-17 DIAGNOSIS — Z992 Dependence on renal dialysis: Secondary | ICD-10-CM | POA: Diagnosis not present

## 2017-02-17 DIAGNOSIS — D631 Anemia in chronic kidney disease: Secondary | ICD-10-CM | POA: Diagnosis not present

## 2017-02-17 DIAGNOSIS — N2581 Secondary hyperparathyroidism of renal origin: Secondary | ICD-10-CM | POA: Diagnosis not present

## 2017-02-17 DIAGNOSIS — E119 Type 2 diabetes mellitus without complications: Secondary | ICD-10-CM | POA: Diagnosis not present

## 2017-02-17 DIAGNOSIS — E785 Hyperlipidemia, unspecified: Secondary | ICD-10-CM | POA: Diagnosis not present

## 2017-02-18 DIAGNOSIS — N186 End stage renal disease: Secondary | ICD-10-CM | POA: Diagnosis not present

## 2017-02-18 DIAGNOSIS — D509 Iron deficiency anemia, unspecified: Secondary | ICD-10-CM | POA: Diagnosis not present

## 2017-02-18 DIAGNOSIS — Z992 Dependence on renal dialysis: Secondary | ICD-10-CM | POA: Diagnosis not present

## 2017-02-18 DIAGNOSIS — D631 Anemia in chronic kidney disease: Secondary | ICD-10-CM | POA: Diagnosis not present

## 2017-02-18 DIAGNOSIS — N2581 Secondary hyperparathyroidism of renal origin: Secondary | ICD-10-CM | POA: Diagnosis not present

## 2017-02-19 DIAGNOSIS — D509 Iron deficiency anemia, unspecified: Secondary | ICD-10-CM | POA: Diagnosis not present

## 2017-02-19 DIAGNOSIS — D631 Anemia in chronic kidney disease: Secondary | ICD-10-CM | POA: Diagnosis not present

## 2017-02-19 DIAGNOSIS — N2581 Secondary hyperparathyroidism of renal origin: Secondary | ICD-10-CM | POA: Diagnosis not present

## 2017-02-19 DIAGNOSIS — N186 End stage renal disease: Secondary | ICD-10-CM | POA: Diagnosis not present

## 2017-02-19 DIAGNOSIS — Z992 Dependence on renal dialysis: Secondary | ICD-10-CM | POA: Diagnosis not present

## 2017-02-20 DIAGNOSIS — D631 Anemia in chronic kidney disease: Secondary | ICD-10-CM | POA: Diagnosis not present

## 2017-02-20 DIAGNOSIS — N2581 Secondary hyperparathyroidism of renal origin: Secondary | ICD-10-CM | POA: Diagnosis not present

## 2017-02-20 DIAGNOSIS — Z992 Dependence on renal dialysis: Secondary | ICD-10-CM | POA: Diagnosis not present

## 2017-02-20 DIAGNOSIS — N186 End stage renal disease: Secondary | ICD-10-CM | POA: Diagnosis not present

## 2017-02-20 DIAGNOSIS — D509 Iron deficiency anemia, unspecified: Secondary | ICD-10-CM | POA: Diagnosis not present

## 2017-02-21 DIAGNOSIS — Z992 Dependence on renal dialysis: Secondary | ICD-10-CM | POA: Diagnosis not present

## 2017-02-21 DIAGNOSIS — N186 End stage renal disease: Secondary | ICD-10-CM | POA: Diagnosis not present

## 2017-02-21 DIAGNOSIS — D509 Iron deficiency anemia, unspecified: Secondary | ICD-10-CM | POA: Diagnosis not present

## 2017-02-21 DIAGNOSIS — N2581 Secondary hyperparathyroidism of renal origin: Secondary | ICD-10-CM | POA: Diagnosis not present

## 2017-02-21 DIAGNOSIS — D631 Anemia in chronic kidney disease: Secondary | ICD-10-CM | POA: Diagnosis not present

## 2017-02-22 DIAGNOSIS — D631 Anemia in chronic kidney disease: Secondary | ICD-10-CM | POA: Diagnosis not present

## 2017-02-22 DIAGNOSIS — N186 End stage renal disease: Secondary | ICD-10-CM | POA: Diagnosis not present

## 2017-02-22 DIAGNOSIS — D509 Iron deficiency anemia, unspecified: Secondary | ICD-10-CM | POA: Diagnosis not present

## 2017-02-22 DIAGNOSIS — Z992 Dependence on renal dialysis: Secondary | ICD-10-CM | POA: Diagnosis not present

## 2017-02-22 DIAGNOSIS — N2581 Secondary hyperparathyroidism of renal origin: Secondary | ICD-10-CM | POA: Diagnosis not present

## 2017-02-23 DIAGNOSIS — Z992 Dependence on renal dialysis: Secondary | ICD-10-CM | POA: Diagnosis not present

## 2017-02-23 DIAGNOSIS — N2581 Secondary hyperparathyroidism of renal origin: Secondary | ICD-10-CM | POA: Diagnosis not present

## 2017-02-23 DIAGNOSIS — D509 Iron deficiency anemia, unspecified: Secondary | ICD-10-CM | POA: Diagnosis not present

## 2017-02-23 DIAGNOSIS — D631 Anemia in chronic kidney disease: Secondary | ICD-10-CM | POA: Diagnosis not present

## 2017-02-23 DIAGNOSIS — N186 End stage renal disease: Secondary | ICD-10-CM | POA: Diagnosis not present

## 2017-02-24 DIAGNOSIS — Z992 Dependence on renal dialysis: Secondary | ICD-10-CM | POA: Diagnosis not present

## 2017-02-24 DIAGNOSIS — N2581 Secondary hyperparathyroidism of renal origin: Secondary | ICD-10-CM | POA: Diagnosis not present

## 2017-02-24 DIAGNOSIS — N186 End stage renal disease: Secondary | ICD-10-CM | POA: Diagnosis not present

## 2017-02-24 DIAGNOSIS — D509 Iron deficiency anemia, unspecified: Secondary | ICD-10-CM | POA: Diagnosis not present

## 2017-02-24 DIAGNOSIS — D631 Anemia in chronic kidney disease: Secondary | ICD-10-CM | POA: Diagnosis not present

## 2017-02-25 DIAGNOSIS — N186 End stage renal disease: Secondary | ICD-10-CM | POA: Diagnosis not present

## 2017-02-25 DIAGNOSIS — D631 Anemia in chronic kidney disease: Secondary | ICD-10-CM | POA: Diagnosis not present

## 2017-02-25 DIAGNOSIS — D509 Iron deficiency anemia, unspecified: Secondary | ICD-10-CM | POA: Diagnosis not present

## 2017-02-25 DIAGNOSIS — N2581 Secondary hyperparathyroidism of renal origin: Secondary | ICD-10-CM | POA: Diagnosis not present

## 2017-02-25 DIAGNOSIS — Z992 Dependence on renal dialysis: Secondary | ICD-10-CM | POA: Diagnosis not present

## 2017-02-26 DIAGNOSIS — N186 End stage renal disease: Secondary | ICD-10-CM | POA: Diagnosis not present

## 2017-02-26 DIAGNOSIS — Z992 Dependence on renal dialysis: Secondary | ICD-10-CM | POA: Diagnosis not present

## 2017-02-26 DIAGNOSIS — N2581 Secondary hyperparathyroidism of renal origin: Secondary | ICD-10-CM | POA: Diagnosis not present

## 2017-02-26 DIAGNOSIS — D509 Iron deficiency anemia, unspecified: Secondary | ICD-10-CM | POA: Diagnosis not present

## 2017-02-26 DIAGNOSIS — D631 Anemia in chronic kidney disease: Secondary | ICD-10-CM | POA: Diagnosis not present

## 2017-02-27 DIAGNOSIS — N186 End stage renal disease: Secondary | ICD-10-CM | POA: Diagnosis not present

## 2017-02-27 DIAGNOSIS — D509 Iron deficiency anemia, unspecified: Secondary | ICD-10-CM | POA: Diagnosis not present

## 2017-02-27 DIAGNOSIS — Z992 Dependence on renal dialysis: Secondary | ICD-10-CM | POA: Diagnosis not present

## 2017-02-27 DIAGNOSIS — N2581 Secondary hyperparathyroidism of renal origin: Secondary | ICD-10-CM | POA: Diagnosis not present

## 2017-02-27 DIAGNOSIS — D631 Anemia in chronic kidney disease: Secondary | ICD-10-CM | POA: Diagnosis not present

## 2017-02-28 DIAGNOSIS — N2581 Secondary hyperparathyroidism of renal origin: Secondary | ICD-10-CM | POA: Diagnosis not present

## 2017-02-28 DIAGNOSIS — N186 End stage renal disease: Secondary | ICD-10-CM | POA: Diagnosis not present

## 2017-02-28 DIAGNOSIS — D631 Anemia in chronic kidney disease: Secondary | ICD-10-CM | POA: Diagnosis not present

## 2017-02-28 DIAGNOSIS — D509 Iron deficiency anemia, unspecified: Secondary | ICD-10-CM | POA: Diagnosis not present

## 2017-02-28 DIAGNOSIS — Z992 Dependence on renal dialysis: Secondary | ICD-10-CM | POA: Diagnosis not present

## 2017-03-01 DIAGNOSIS — D631 Anemia in chronic kidney disease: Secondary | ICD-10-CM | POA: Diagnosis not present

## 2017-03-01 DIAGNOSIS — N2581 Secondary hyperparathyroidism of renal origin: Secondary | ICD-10-CM | POA: Diagnosis not present

## 2017-03-01 DIAGNOSIS — Z992 Dependence on renal dialysis: Secondary | ICD-10-CM | POA: Diagnosis not present

## 2017-03-01 DIAGNOSIS — D509 Iron deficiency anemia, unspecified: Secondary | ICD-10-CM | POA: Diagnosis not present

## 2017-03-01 DIAGNOSIS — N186 End stage renal disease: Secondary | ICD-10-CM | POA: Diagnosis not present

## 2017-03-02 DIAGNOSIS — N186 End stage renal disease: Secondary | ICD-10-CM | POA: Diagnosis not present

## 2017-03-02 DIAGNOSIS — N2581 Secondary hyperparathyroidism of renal origin: Secondary | ICD-10-CM | POA: Diagnosis not present

## 2017-03-02 DIAGNOSIS — D631 Anemia in chronic kidney disease: Secondary | ICD-10-CM | POA: Diagnosis not present

## 2017-03-02 DIAGNOSIS — D509 Iron deficiency anemia, unspecified: Secondary | ICD-10-CM | POA: Diagnosis not present

## 2017-03-02 DIAGNOSIS — Z992 Dependence on renal dialysis: Secondary | ICD-10-CM | POA: Diagnosis not present

## 2017-03-03 DIAGNOSIS — Z992 Dependence on renal dialysis: Secondary | ICD-10-CM | POA: Diagnosis not present

## 2017-03-03 DIAGNOSIS — N2581 Secondary hyperparathyroidism of renal origin: Secondary | ICD-10-CM | POA: Diagnosis not present

## 2017-03-03 DIAGNOSIS — N186 End stage renal disease: Secondary | ICD-10-CM | POA: Diagnosis not present

## 2017-03-03 DIAGNOSIS — D631 Anemia in chronic kidney disease: Secondary | ICD-10-CM | POA: Diagnosis not present

## 2017-03-03 DIAGNOSIS — D509 Iron deficiency anemia, unspecified: Secondary | ICD-10-CM | POA: Diagnosis not present

## 2017-03-04 DIAGNOSIS — Z992 Dependence on renal dialysis: Secondary | ICD-10-CM | POA: Diagnosis not present

## 2017-03-04 DIAGNOSIS — D631 Anemia in chronic kidney disease: Secondary | ICD-10-CM | POA: Diagnosis not present

## 2017-03-04 DIAGNOSIS — N186 End stage renal disease: Secondary | ICD-10-CM | POA: Diagnosis not present

## 2017-03-05 DIAGNOSIS — D631 Anemia in chronic kidney disease: Secondary | ICD-10-CM | POA: Diagnosis not present

## 2017-03-05 DIAGNOSIS — N186 End stage renal disease: Secondary | ICD-10-CM | POA: Diagnosis not present

## 2017-03-05 DIAGNOSIS — Z992 Dependence on renal dialysis: Secondary | ICD-10-CM | POA: Diagnosis not present

## 2017-03-06 DIAGNOSIS — D631 Anemia in chronic kidney disease: Secondary | ICD-10-CM | POA: Diagnosis not present

## 2017-03-06 DIAGNOSIS — N186 End stage renal disease: Secondary | ICD-10-CM | POA: Diagnosis not present

## 2017-03-06 DIAGNOSIS — Z992 Dependence on renal dialysis: Secondary | ICD-10-CM | POA: Diagnosis not present

## 2017-03-07 DIAGNOSIS — D631 Anemia in chronic kidney disease: Secondary | ICD-10-CM | POA: Diagnosis not present

## 2017-03-07 DIAGNOSIS — N186 End stage renal disease: Secondary | ICD-10-CM | POA: Diagnosis not present

## 2017-03-07 DIAGNOSIS — Z992 Dependence on renal dialysis: Secondary | ICD-10-CM | POA: Diagnosis not present

## 2017-03-08 DIAGNOSIS — Z992 Dependence on renal dialysis: Secondary | ICD-10-CM | POA: Diagnosis not present

## 2017-03-08 DIAGNOSIS — D631 Anemia in chronic kidney disease: Secondary | ICD-10-CM | POA: Diagnosis not present

## 2017-03-08 DIAGNOSIS — N186 End stage renal disease: Secondary | ICD-10-CM | POA: Diagnosis not present

## 2017-03-09 DIAGNOSIS — D631 Anemia in chronic kidney disease: Secondary | ICD-10-CM | POA: Diagnosis not present

## 2017-03-09 DIAGNOSIS — N186 End stage renal disease: Secondary | ICD-10-CM | POA: Diagnosis not present

## 2017-03-09 DIAGNOSIS — Z992 Dependence on renal dialysis: Secondary | ICD-10-CM | POA: Diagnosis not present

## 2017-03-10 DIAGNOSIS — N186 End stage renal disease: Secondary | ICD-10-CM | POA: Diagnosis not present

## 2017-03-10 DIAGNOSIS — D631 Anemia in chronic kidney disease: Secondary | ICD-10-CM | POA: Diagnosis not present

## 2017-03-10 DIAGNOSIS — Z992 Dependence on renal dialysis: Secondary | ICD-10-CM | POA: Diagnosis not present

## 2017-03-11 DIAGNOSIS — D631 Anemia in chronic kidney disease: Secondary | ICD-10-CM | POA: Diagnosis not present

## 2017-03-11 DIAGNOSIS — N186 End stage renal disease: Secondary | ICD-10-CM | POA: Diagnosis not present

## 2017-03-11 DIAGNOSIS — Z992 Dependence on renal dialysis: Secondary | ICD-10-CM | POA: Diagnosis not present

## 2017-03-12 DIAGNOSIS — N186 End stage renal disease: Secondary | ICD-10-CM | POA: Diagnosis not present

## 2017-03-12 DIAGNOSIS — D631 Anemia in chronic kidney disease: Secondary | ICD-10-CM | POA: Diagnosis not present

## 2017-03-12 DIAGNOSIS — Z992 Dependence on renal dialysis: Secondary | ICD-10-CM | POA: Diagnosis not present

## 2017-03-13 DIAGNOSIS — D631 Anemia in chronic kidney disease: Secondary | ICD-10-CM | POA: Diagnosis not present

## 2017-03-13 DIAGNOSIS — Z992 Dependence on renal dialysis: Secondary | ICD-10-CM | POA: Diagnosis not present

## 2017-03-13 DIAGNOSIS — N186 End stage renal disease: Secondary | ICD-10-CM | POA: Diagnosis not present

## 2017-03-14 DIAGNOSIS — N2581 Secondary hyperparathyroidism of renal origin: Secondary | ICD-10-CM | POA: Diagnosis not present

## 2017-03-14 DIAGNOSIS — D509 Iron deficiency anemia, unspecified: Secondary | ICD-10-CM | POA: Diagnosis not present

## 2017-03-14 DIAGNOSIS — Z992 Dependence on renal dialysis: Secondary | ICD-10-CM | POA: Diagnosis not present

## 2017-03-14 DIAGNOSIS — D631 Anemia in chronic kidney disease: Secondary | ICD-10-CM | POA: Diagnosis not present

## 2017-03-14 DIAGNOSIS — N186 End stage renal disease: Secondary | ICD-10-CM | POA: Diagnosis not present

## 2017-03-14 DIAGNOSIS — Z23 Encounter for immunization: Secondary | ICD-10-CM | POA: Diagnosis not present

## 2017-03-15 DIAGNOSIS — D631 Anemia in chronic kidney disease: Secondary | ICD-10-CM | POA: Diagnosis not present

## 2017-03-15 DIAGNOSIS — Z992 Dependence on renal dialysis: Secondary | ICD-10-CM | POA: Diagnosis not present

## 2017-03-15 DIAGNOSIS — D509 Iron deficiency anemia, unspecified: Secondary | ICD-10-CM | POA: Diagnosis not present

## 2017-03-15 DIAGNOSIS — N2581 Secondary hyperparathyroidism of renal origin: Secondary | ICD-10-CM | POA: Diagnosis not present

## 2017-03-15 DIAGNOSIS — Z23 Encounter for immunization: Secondary | ICD-10-CM | POA: Diagnosis not present

## 2017-03-15 DIAGNOSIS — N186 End stage renal disease: Secondary | ICD-10-CM | POA: Diagnosis not present

## 2017-03-16 DIAGNOSIS — Z23 Encounter for immunization: Secondary | ICD-10-CM | POA: Diagnosis not present

## 2017-03-16 DIAGNOSIS — N186 End stage renal disease: Secondary | ICD-10-CM | POA: Diagnosis not present

## 2017-03-16 DIAGNOSIS — N2581 Secondary hyperparathyroidism of renal origin: Secondary | ICD-10-CM | POA: Diagnosis not present

## 2017-03-16 DIAGNOSIS — D631 Anemia in chronic kidney disease: Secondary | ICD-10-CM | POA: Diagnosis not present

## 2017-03-16 DIAGNOSIS — Z992 Dependence on renal dialysis: Secondary | ICD-10-CM | POA: Diagnosis not present

## 2017-03-16 DIAGNOSIS — D509 Iron deficiency anemia, unspecified: Secondary | ICD-10-CM | POA: Diagnosis not present

## 2017-03-17 DIAGNOSIS — Z992 Dependence on renal dialysis: Secondary | ICD-10-CM | POA: Diagnosis not present

## 2017-03-17 DIAGNOSIS — D509 Iron deficiency anemia, unspecified: Secondary | ICD-10-CM | POA: Diagnosis not present

## 2017-03-17 DIAGNOSIS — N2581 Secondary hyperparathyroidism of renal origin: Secondary | ICD-10-CM | POA: Diagnosis not present

## 2017-03-17 DIAGNOSIS — N186 End stage renal disease: Secondary | ICD-10-CM | POA: Diagnosis not present

## 2017-03-17 DIAGNOSIS — D631 Anemia in chronic kidney disease: Secondary | ICD-10-CM | POA: Diagnosis not present

## 2017-03-17 DIAGNOSIS — Z23 Encounter for immunization: Secondary | ICD-10-CM | POA: Diagnosis not present

## 2017-03-18 DIAGNOSIS — N2581 Secondary hyperparathyroidism of renal origin: Secondary | ICD-10-CM | POA: Diagnosis not present

## 2017-03-18 DIAGNOSIS — D631 Anemia in chronic kidney disease: Secondary | ICD-10-CM | POA: Diagnosis not present

## 2017-03-18 DIAGNOSIS — Z23 Encounter for immunization: Secondary | ICD-10-CM | POA: Diagnosis not present

## 2017-03-18 DIAGNOSIS — N186 End stage renal disease: Secondary | ICD-10-CM | POA: Diagnosis not present

## 2017-03-18 DIAGNOSIS — Z992 Dependence on renal dialysis: Secondary | ICD-10-CM | POA: Diagnosis not present

## 2017-03-18 DIAGNOSIS — D509 Iron deficiency anemia, unspecified: Secondary | ICD-10-CM | POA: Diagnosis not present

## 2017-03-19 DIAGNOSIS — D509 Iron deficiency anemia, unspecified: Secondary | ICD-10-CM | POA: Diagnosis not present

## 2017-03-19 DIAGNOSIS — N186 End stage renal disease: Secondary | ICD-10-CM | POA: Diagnosis not present

## 2017-03-19 DIAGNOSIS — D631 Anemia in chronic kidney disease: Secondary | ICD-10-CM | POA: Diagnosis not present

## 2017-03-19 DIAGNOSIS — N2581 Secondary hyperparathyroidism of renal origin: Secondary | ICD-10-CM | POA: Diagnosis not present

## 2017-03-19 DIAGNOSIS — Z992 Dependence on renal dialysis: Secondary | ICD-10-CM | POA: Diagnosis not present

## 2017-03-19 DIAGNOSIS — Z23 Encounter for immunization: Secondary | ICD-10-CM | POA: Diagnosis not present

## 2017-03-20 DIAGNOSIS — Z992 Dependence on renal dialysis: Secondary | ICD-10-CM | POA: Diagnosis not present

## 2017-03-20 DIAGNOSIS — N186 End stage renal disease: Secondary | ICD-10-CM | POA: Diagnosis not present

## 2017-03-20 DIAGNOSIS — Z23 Encounter for immunization: Secondary | ICD-10-CM | POA: Diagnosis not present

## 2017-03-20 DIAGNOSIS — N2581 Secondary hyperparathyroidism of renal origin: Secondary | ICD-10-CM | POA: Diagnosis not present

## 2017-03-20 DIAGNOSIS — D509 Iron deficiency anemia, unspecified: Secondary | ICD-10-CM | POA: Diagnosis not present

## 2017-03-20 DIAGNOSIS — D631 Anemia in chronic kidney disease: Secondary | ICD-10-CM | POA: Diagnosis not present

## 2017-03-21 DIAGNOSIS — Z992 Dependence on renal dialysis: Secondary | ICD-10-CM | POA: Diagnosis not present

## 2017-03-21 DIAGNOSIS — D631 Anemia in chronic kidney disease: Secondary | ICD-10-CM | POA: Diagnosis not present

## 2017-03-21 DIAGNOSIS — N186 End stage renal disease: Secondary | ICD-10-CM | POA: Diagnosis not present

## 2017-03-21 DIAGNOSIS — D509 Iron deficiency anemia, unspecified: Secondary | ICD-10-CM | POA: Diagnosis not present

## 2017-03-21 DIAGNOSIS — Z23 Encounter for immunization: Secondary | ICD-10-CM | POA: Diagnosis not present

## 2017-03-21 DIAGNOSIS — N2581 Secondary hyperparathyroidism of renal origin: Secondary | ICD-10-CM | POA: Diagnosis not present

## 2017-03-22 DIAGNOSIS — D631 Anemia in chronic kidney disease: Secondary | ICD-10-CM | POA: Diagnosis not present

## 2017-03-22 DIAGNOSIS — N2581 Secondary hyperparathyroidism of renal origin: Secondary | ICD-10-CM | POA: Diagnosis not present

## 2017-03-22 DIAGNOSIS — Z23 Encounter for immunization: Secondary | ICD-10-CM | POA: Diagnosis not present

## 2017-03-22 DIAGNOSIS — N186 End stage renal disease: Secondary | ICD-10-CM | POA: Diagnosis not present

## 2017-03-22 DIAGNOSIS — D509 Iron deficiency anemia, unspecified: Secondary | ICD-10-CM | POA: Diagnosis not present

## 2017-03-22 DIAGNOSIS — Z992 Dependence on renal dialysis: Secondary | ICD-10-CM | POA: Diagnosis not present

## 2017-03-23 DIAGNOSIS — Z23 Encounter for immunization: Secondary | ICD-10-CM | POA: Diagnosis not present

## 2017-03-23 DIAGNOSIS — N2581 Secondary hyperparathyroidism of renal origin: Secondary | ICD-10-CM | POA: Diagnosis not present

## 2017-03-23 DIAGNOSIS — N186 End stage renal disease: Secondary | ICD-10-CM | POA: Diagnosis not present

## 2017-03-23 DIAGNOSIS — Z992 Dependence on renal dialysis: Secondary | ICD-10-CM | POA: Diagnosis not present

## 2017-03-23 DIAGNOSIS — D509 Iron deficiency anemia, unspecified: Secondary | ICD-10-CM | POA: Diagnosis not present

## 2017-03-23 DIAGNOSIS — D631 Anemia in chronic kidney disease: Secondary | ICD-10-CM | POA: Diagnosis not present

## 2017-03-26 DIAGNOSIS — Z992 Dependence on renal dialysis: Secondary | ICD-10-CM | POA: Diagnosis not present

## 2017-03-26 DIAGNOSIS — D631 Anemia in chronic kidney disease: Secondary | ICD-10-CM | POA: Diagnosis not present

## 2017-03-26 DIAGNOSIS — N186 End stage renal disease: Secondary | ICD-10-CM | POA: Diagnosis not present

## 2017-03-26 DIAGNOSIS — D509 Iron deficiency anemia, unspecified: Secondary | ICD-10-CM | POA: Diagnosis not present

## 2017-03-26 DIAGNOSIS — Z23 Encounter for immunization: Secondary | ICD-10-CM | POA: Diagnosis not present

## 2017-03-26 DIAGNOSIS — N2581 Secondary hyperparathyroidism of renal origin: Secondary | ICD-10-CM | POA: Diagnosis not present

## 2017-03-28 DIAGNOSIS — N186 End stage renal disease: Secondary | ICD-10-CM | POA: Diagnosis not present

## 2017-03-28 DIAGNOSIS — N2581 Secondary hyperparathyroidism of renal origin: Secondary | ICD-10-CM | POA: Diagnosis not present

## 2017-03-28 DIAGNOSIS — D631 Anemia in chronic kidney disease: Secondary | ICD-10-CM | POA: Diagnosis not present

## 2017-03-28 DIAGNOSIS — Z23 Encounter for immunization: Secondary | ICD-10-CM | POA: Diagnosis not present

## 2017-03-28 DIAGNOSIS — Z992 Dependence on renal dialysis: Secondary | ICD-10-CM | POA: Diagnosis not present

## 2017-03-28 DIAGNOSIS — D509 Iron deficiency anemia, unspecified: Secondary | ICD-10-CM | POA: Diagnosis not present

## 2017-03-30 DIAGNOSIS — N2581 Secondary hyperparathyroidism of renal origin: Secondary | ICD-10-CM | POA: Diagnosis not present

## 2017-03-30 DIAGNOSIS — D631 Anemia in chronic kidney disease: Secondary | ICD-10-CM | POA: Diagnosis not present

## 2017-03-30 DIAGNOSIS — N186 End stage renal disease: Secondary | ICD-10-CM | POA: Diagnosis not present

## 2017-03-30 DIAGNOSIS — D509 Iron deficiency anemia, unspecified: Secondary | ICD-10-CM | POA: Diagnosis not present

## 2017-03-30 DIAGNOSIS — Z23 Encounter for immunization: Secondary | ICD-10-CM | POA: Diagnosis not present

## 2017-03-30 DIAGNOSIS — Z992 Dependence on renal dialysis: Secondary | ICD-10-CM | POA: Diagnosis not present

## 2017-03-31 DIAGNOSIS — Z992 Dependence on renal dialysis: Secondary | ICD-10-CM | POA: Diagnosis not present

## 2017-03-31 DIAGNOSIS — N2581 Secondary hyperparathyroidism of renal origin: Secondary | ICD-10-CM | POA: Diagnosis not present

## 2017-03-31 DIAGNOSIS — D631 Anemia in chronic kidney disease: Secondary | ICD-10-CM | POA: Diagnosis not present

## 2017-03-31 DIAGNOSIS — D509 Iron deficiency anemia, unspecified: Secondary | ICD-10-CM | POA: Diagnosis not present

## 2017-03-31 DIAGNOSIS — Z23 Encounter for immunization: Secondary | ICD-10-CM | POA: Diagnosis not present

## 2017-03-31 DIAGNOSIS — N186 End stage renal disease: Secondary | ICD-10-CM | POA: Diagnosis not present

## 2017-04-01 DIAGNOSIS — D509 Iron deficiency anemia, unspecified: Secondary | ICD-10-CM | POA: Diagnosis not present

## 2017-04-01 DIAGNOSIS — D631 Anemia in chronic kidney disease: Secondary | ICD-10-CM | POA: Diagnosis not present

## 2017-04-01 DIAGNOSIS — Z992 Dependence on renal dialysis: Secondary | ICD-10-CM | POA: Diagnosis not present

## 2017-04-01 DIAGNOSIS — N186 End stage renal disease: Secondary | ICD-10-CM | POA: Diagnosis not present

## 2017-04-01 DIAGNOSIS — N2581 Secondary hyperparathyroidism of renal origin: Secondary | ICD-10-CM | POA: Diagnosis not present

## 2017-04-01 DIAGNOSIS — Z23 Encounter for immunization: Secondary | ICD-10-CM | POA: Diagnosis not present

## 2017-04-03 DIAGNOSIS — Z992 Dependence on renal dialysis: Secondary | ICD-10-CM | POA: Diagnosis not present

## 2017-04-03 DIAGNOSIS — N2581 Secondary hyperparathyroidism of renal origin: Secondary | ICD-10-CM | POA: Diagnosis not present

## 2017-04-03 DIAGNOSIS — D631 Anemia in chronic kidney disease: Secondary | ICD-10-CM | POA: Diagnosis not present

## 2017-04-03 DIAGNOSIS — D509 Iron deficiency anemia, unspecified: Secondary | ICD-10-CM | POA: Diagnosis not present

## 2017-04-03 DIAGNOSIS — Z23 Encounter for immunization: Secondary | ICD-10-CM | POA: Diagnosis not present

## 2017-04-03 DIAGNOSIS — N186 End stage renal disease: Secondary | ICD-10-CM | POA: Diagnosis not present

## 2017-04-04 DIAGNOSIS — Z992 Dependence on renal dialysis: Secondary | ICD-10-CM | POA: Diagnosis not present

## 2017-04-04 DIAGNOSIS — D509 Iron deficiency anemia, unspecified: Secondary | ICD-10-CM | POA: Diagnosis not present

## 2017-04-04 DIAGNOSIS — Z23 Encounter for immunization: Secondary | ICD-10-CM | POA: Diagnosis not present

## 2017-04-04 DIAGNOSIS — N186 End stage renal disease: Secondary | ICD-10-CM | POA: Diagnosis not present

## 2017-04-04 DIAGNOSIS — D631 Anemia in chronic kidney disease: Secondary | ICD-10-CM | POA: Diagnosis not present

## 2017-04-04 DIAGNOSIS — N2581 Secondary hyperparathyroidism of renal origin: Secondary | ICD-10-CM | POA: Diagnosis not present

## 2017-04-05 DIAGNOSIS — D509 Iron deficiency anemia, unspecified: Secondary | ICD-10-CM | POA: Diagnosis not present

## 2017-04-05 DIAGNOSIS — D631 Anemia in chronic kidney disease: Secondary | ICD-10-CM | POA: Diagnosis not present

## 2017-04-05 DIAGNOSIS — Z23 Encounter for immunization: Secondary | ICD-10-CM | POA: Diagnosis not present

## 2017-04-05 DIAGNOSIS — N186 End stage renal disease: Secondary | ICD-10-CM | POA: Diagnosis not present

## 2017-04-05 DIAGNOSIS — Z992 Dependence on renal dialysis: Secondary | ICD-10-CM | POA: Diagnosis not present

## 2017-04-05 DIAGNOSIS — N2581 Secondary hyperparathyroidism of renal origin: Secondary | ICD-10-CM | POA: Diagnosis not present

## 2017-04-06 DIAGNOSIS — D631 Anemia in chronic kidney disease: Secondary | ICD-10-CM | POA: Diagnosis not present

## 2017-04-06 DIAGNOSIS — Z23 Encounter for immunization: Secondary | ICD-10-CM | POA: Diagnosis not present

## 2017-04-06 DIAGNOSIS — N2581 Secondary hyperparathyroidism of renal origin: Secondary | ICD-10-CM | POA: Diagnosis not present

## 2017-04-06 DIAGNOSIS — N186 End stage renal disease: Secondary | ICD-10-CM | POA: Diagnosis not present

## 2017-04-06 DIAGNOSIS — Z992 Dependence on renal dialysis: Secondary | ICD-10-CM | POA: Diagnosis not present

## 2017-04-06 DIAGNOSIS — D509 Iron deficiency anemia, unspecified: Secondary | ICD-10-CM | POA: Diagnosis not present

## 2017-04-09 DIAGNOSIS — D509 Iron deficiency anemia, unspecified: Secondary | ICD-10-CM | POA: Diagnosis not present

## 2017-04-09 DIAGNOSIS — D631 Anemia in chronic kidney disease: Secondary | ICD-10-CM | POA: Diagnosis not present

## 2017-04-09 DIAGNOSIS — Z23 Encounter for immunization: Secondary | ICD-10-CM | POA: Diagnosis not present

## 2017-04-09 DIAGNOSIS — Z992 Dependence on renal dialysis: Secondary | ICD-10-CM | POA: Diagnosis not present

## 2017-04-09 DIAGNOSIS — N2581 Secondary hyperparathyroidism of renal origin: Secondary | ICD-10-CM | POA: Diagnosis not present

## 2017-04-09 DIAGNOSIS — N186 End stage renal disease: Secondary | ICD-10-CM | POA: Diagnosis not present

## 2017-04-10 DIAGNOSIS — D509 Iron deficiency anemia, unspecified: Secondary | ICD-10-CM | POA: Diagnosis not present

## 2017-04-10 DIAGNOSIS — Z992 Dependence on renal dialysis: Secondary | ICD-10-CM | POA: Diagnosis not present

## 2017-04-10 DIAGNOSIS — N186 End stage renal disease: Secondary | ICD-10-CM | POA: Diagnosis not present

## 2017-04-10 DIAGNOSIS — Z23 Encounter for immunization: Secondary | ICD-10-CM | POA: Diagnosis not present

## 2017-04-10 DIAGNOSIS — D631 Anemia in chronic kidney disease: Secondary | ICD-10-CM | POA: Diagnosis not present

## 2017-04-10 DIAGNOSIS — N2581 Secondary hyperparathyroidism of renal origin: Secondary | ICD-10-CM | POA: Diagnosis not present

## 2017-04-11 DIAGNOSIS — N2581 Secondary hyperparathyroidism of renal origin: Secondary | ICD-10-CM | POA: Diagnosis not present

## 2017-04-11 DIAGNOSIS — D631 Anemia in chronic kidney disease: Secondary | ICD-10-CM | POA: Diagnosis not present

## 2017-04-11 DIAGNOSIS — Z992 Dependence on renal dialysis: Secondary | ICD-10-CM | POA: Diagnosis not present

## 2017-04-11 DIAGNOSIS — D509 Iron deficiency anemia, unspecified: Secondary | ICD-10-CM | POA: Diagnosis not present

## 2017-04-11 DIAGNOSIS — Z23 Encounter for immunization: Secondary | ICD-10-CM | POA: Diagnosis not present

## 2017-04-11 DIAGNOSIS — N186 End stage renal disease: Secondary | ICD-10-CM | POA: Diagnosis not present

## 2017-04-12 DIAGNOSIS — D509 Iron deficiency anemia, unspecified: Secondary | ICD-10-CM | POA: Diagnosis not present

## 2017-04-12 DIAGNOSIS — Z23 Encounter for immunization: Secondary | ICD-10-CM | POA: Diagnosis not present

## 2017-04-12 DIAGNOSIS — N186 End stage renal disease: Secondary | ICD-10-CM | POA: Diagnosis not present

## 2017-04-12 DIAGNOSIS — D631 Anemia in chronic kidney disease: Secondary | ICD-10-CM | POA: Diagnosis not present

## 2017-04-12 DIAGNOSIS — Z992 Dependence on renal dialysis: Secondary | ICD-10-CM | POA: Diagnosis not present

## 2017-04-12 DIAGNOSIS — N2581 Secondary hyperparathyroidism of renal origin: Secondary | ICD-10-CM | POA: Diagnosis not present

## 2017-04-13 DIAGNOSIS — N2581 Secondary hyperparathyroidism of renal origin: Secondary | ICD-10-CM | POA: Diagnosis not present

## 2017-04-13 DIAGNOSIS — Z23 Encounter for immunization: Secondary | ICD-10-CM | POA: Diagnosis not present

## 2017-04-13 DIAGNOSIS — D631 Anemia in chronic kidney disease: Secondary | ICD-10-CM | POA: Diagnosis not present

## 2017-04-13 DIAGNOSIS — D509 Iron deficiency anemia, unspecified: Secondary | ICD-10-CM | POA: Diagnosis not present

## 2017-04-13 DIAGNOSIS — Z992 Dependence on renal dialysis: Secondary | ICD-10-CM | POA: Diagnosis not present

## 2017-04-13 DIAGNOSIS — N186 End stage renal disease: Secondary | ICD-10-CM | POA: Diagnosis not present

## 2017-04-15 DIAGNOSIS — D509 Iron deficiency anemia, unspecified: Secondary | ICD-10-CM | POA: Diagnosis not present

## 2017-04-15 DIAGNOSIS — D631 Anemia in chronic kidney disease: Secondary | ICD-10-CM | POA: Diagnosis not present

## 2017-04-15 DIAGNOSIS — Z992 Dependence on renal dialysis: Secondary | ICD-10-CM | POA: Diagnosis not present

## 2017-04-15 DIAGNOSIS — N186 End stage renal disease: Secondary | ICD-10-CM | POA: Diagnosis not present

## 2017-04-16 DIAGNOSIS — Z992 Dependence on renal dialysis: Secondary | ICD-10-CM | POA: Diagnosis not present

## 2017-04-16 DIAGNOSIS — N186 End stage renal disease: Secondary | ICD-10-CM | POA: Diagnosis not present

## 2017-04-16 DIAGNOSIS — D631 Anemia in chronic kidney disease: Secondary | ICD-10-CM | POA: Diagnosis not present

## 2017-04-16 DIAGNOSIS — D509 Iron deficiency anemia, unspecified: Secondary | ICD-10-CM | POA: Diagnosis not present

## 2017-04-18 DIAGNOSIS — D509 Iron deficiency anemia, unspecified: Secondary | ICD-10-CM | POA: Diagnosis not present

## 2017-04-18 DIAGNOSIS — D631 Anemia in chronic kidney disease: Secondary | ICD-10-CM | POA: Diagnosis not present

## 2017-04-18 DIAGNOSIS — N186 End stage renal disease: Secondary | ICD-10-CM | POA: Diagnosis not present

## 2017-04-18 DIAGNOSIS — Z992 Dependence on renal dialysis: Secondary | ICD-10-CM | POA: Diagnosis not present

## 2017-04-20 DIAGNOSIS — D509 Iron deficiency anemia, unspecified: Secondary | ICD-10-CM | POA: Diagnosis not present

## 2017-04-20 DIAGNOSIS — Z992 Dependence on renal dialysis: Secondary | ICD-10-CM | POA: Diagnosis not present

## 2017-04-20 DIAGNOSIS — D631 Anemia in chronic kidney disease: Secondary | ICD-10-CM | POA: Diagnosis not present

## 2017-04-20 DIAGNOSIS — N186 End stage renal disease: Secondary | ICD-10-CM | POA: Diagnosis not present

## 2017-04-21 DIAGNOSIS — N186 End stage renal disease: Secondary | ICD-10-CM | POA: Diagnosis not present

## 2017-04-21 DIAGNOSIS — Z992 Dependence on renal dialysis: Secondary | ICD-10-CM | POA: Diagnosis not present

## 2017-04-21 DIAGNOSIS — D509 Iron deficiency anemia, unspecified: Secondary | ICD-10-CM | POA: Diagnosis not present

## 2017-04-21 DIAGNOSIS — D631 Anemia in chronic kidney disease: Secondary | ICD-10-CM | POA: Diagnosis not present

## 2017-04-23 DIAGNOSIS — Z992 Dependence on renal dialysis: Secondary | ICD-10-CM | POA: Diagnosis not present

## 2017-04-23 DIAGNOSIS — D509 Iron deficiency anemia, unspecified: Secondary | ICD-10-CM | POA: Diagnosis not present

## 2017-04-23 DIAGNOSIS — D631 Anemia in chronic kidney disease: Secondary | ICD-10-CM | POA: Diagnosis not present

## 2017-04-23 DIAGNOSIS — N186 End stage renal disease: Secondary | ICD-10-CM | POA: Diagnosis not present

## 2017-04-24 DIAGNOSIS — D509 Iron deficiency anemia, unspecified: Secondary | ICD-10-CM | POA: Diagnosis not present

## 2017-04-24 DIAGNOSIS — N186 End stage renal disease: Secondary | ICD-10-CM | POA: Diagnosis not present

## 2017-04-24 DIAGNOSIS — Z992 Dependence on renal dialysis: Secondary | ICD-10-CM | POA: Diagnosis not present

## 2017-04-24 DIAGNOSIS — D631 Anemia in chronic kidney disease: Secondary | ICD-10-CM | POA: Diagnosis not present

## 2017-04-25 DIAGNOSIS — D509 Iron deficiency anemia, unspecified: Secondary | ICD-10-CM | POA: Diagnosis not present

## 2017-04-25 DIAGNOSIS — D631 Anemia in chronic kidney disease: Secondary | ICD-10-CM | POA: Diagnosis not present

## 2017-04-25 DIAGNOSIS — Z992 Dependence on renal dialysis: Secondary | ICD-10-CM | POA: Diagnosis not present

## 2017-04-25 DIAGNOSIS — N186 End stage renal disease: Secondary | ICD-10-CM | POA: Diagnosis not present

## 2017-04-26 DIAGNOSIS — D631 Anemia in chronic kidney disease: Secondary | ICD-10-CM | POA: Diagnosis not present

## 2017-04-26 DIAGNOSIS — Z992 Dependence on renal dialysis: Secondary | ICD-10-CM | POA: Diagnosis not present

## 2017-04-26 DIAGNOSIS — N186 End stage renal disease: Secondary | ICD-10-CM | POA: Diagnosis not present

## 2017-04-26 DIAGNOSIS — D509 Iron deficiency anemia, unspecified: Secondary | ICD-10-CM | POA: Diagnosis not present

## 2017-04-27 DIAGNOSIS — D509 Iron deficiency anemia, unspecified: Secondary | ICD-10-CM | POA: Diagnosis not present

## 2017-04-27 DIAGNOSIS — D631 Anemia in chronic kidney disease: Secondary | ICD-10-CM | POA: Diagnosis not present

## 2017-04-27 DIAGNOSIS — Z992 Dependence on renal dialysis: Secondary | ICD-10-CM | POA: Diagnosis not present

## 2017-04-27 DIAGNOSIS — N186 End stage renal disease: Secondary | ICD-10-CM | POA: Diagnosis not present

## 2017-04-28 DIAGNOSIS — N186 End stage renal disease: Secondary | ICD-10-CM | POA: Diagnosis not present

## 2017-04-28 DIAGNOSIS — Z992 Dependence on renal dialysis: Secondary | ICD-10-CM | POA: Diagnosis not present

## 2017-04-28 DIAGNOSIS — D509 Iron deficiency anemia, unspecified: Secondary | ICD-10-CM | POA: Diagnosis not present

## 2017-04-28 DIAGNOSIS — D631 Anemia in chronic kidney disease: Secondary | ICD-10-CM | POA: Diagnosis not present

## 2017-04-29 DIAGNOSIS — D631 Anemia in chronic kidney disease: Secondary | ICD-10-CM | POA: Diagnosis not present

## 2017-04-29 DIAGNOSIS — N186 End stage renal disease: Secondary | ICD-10-CM | POA: Diagnosis not present

## 2017-04-29 DIAGNOSIS — D509 Iron deficiency anemia, unspecified: Secondary | ICD-10-CM | POA: Diagnosis not present

## 2017-04-29 DIAGNOSIS — Z992 Dependence on renal dialysis: Secondary | ICD-10-CM | POA: Diagnosis not present

## 2017-04-30 DIAGNOSIS — Z992 Dependence on renal dialysis: Secondary | ICD-10-CM | POA: Diagnosis not present

## 2017-04-30 DIAGNOSIS — N186 End stage renal disease: Secondary | ICD-10-CM | POA: Diagnosis not present

## 2017-04-30 DIAGNOSIS — D631 Anemia in chronic kidney disease: Secondary | ICD-10-CM | POA: Diagnosis not present

## 2017-04-30 DIAGNOSIS — D509 Iron deficiency anemia, unspecified: Secondary | ICD-10-CM | POA: Diagnosis not present

## 2017-05-01 DIAGNOSIS — N186 End stage renal disease: Secondary | ICD-10-CM | POA: Diagnosis not present

## 2017-05-01 DIAGNOSIS — D509 Iron deficiency anemia, unspecified: Secondary | ICD-10-CM | POA: Diagnosis not present

## 2017-05-01 DIAGNOSIS — D631 Anemia in chronic kidney disease: Secondary | ICD-10-CM | POA: Diagnosis not present

## 2017-05-01 DIAGNOSIS — Z992 Dependence on renal dialysis: Secondary | ICD-10-CM | POA: Diagnosis not present

## 2017-05-03 DIAGNOSIS — D631 Anemia in chronic kidney disease: Secondary | ICD-10-CM | POA: Diagnosis not present

## 2017-05-03 DIAGNOSIS — Z992 Dependence on renal dialysis: Secondary | ICD-10-CM | POA: Diagnosis not present

## 2017-05-03 DIAGNOSIS — D509 Iron deficiency anemia, unspecified: Secondary | ICD-10-CM | POA: Diagnosis not present

## 2017-05-03 DIAGNOSIS — N186 End stage renal disease: Secondary | ICD-10-CM | POA: Diagnosis not present

## 2017-05-07 DIAGNOSIS — N186 End stage renal disease: Secondary | ICD-10-CM | POA: Diagnosis not present

## 2017-05-07 DIAGNOSIS — D509 Iron deficiency anemia, unspecified: Secondary | ICD-10-CM | POA: Diagnosis not present

## 2017-05-07 DIAGNOSIS — D631 Anemia in chronic kidney disease: Secondary | ICD-10-CM | POA: Diagnosis not present

## 2017-05-07 DIAGNOSIS — Z992 Dependence on renal dialysis: Secondary | ICD-10-CM | POA: Diagnosis not present

## 2017-05-08 DIAGNOSIS — D631 Anemia in chronic kidney disease: Secondary | ICD-10-CM | POA: Diagnosis not present

## 2017-05-08 DIAGNOSIS — D509 Iron deficiency anemia, unspecified: Secondary | ICD-10-CM | POA: Diagnosis not present

## 2017-05-08 DIAGNOSIS — Z992 Dependence on renal dialysis: Secondary | ICD-10-CM | POA: Diagnosis not present

## 2017-05-08 DIAGNOSIS — N186 End stage renal disease: Secondary | ICD-10-CM | POA: Diagnosis not present

## 2017-05-09 DIAGNOSIS — N186 End stage renal disease: Secondary | ICD-10-CM | POA: Diagnosis not present

## 2017-05-09 DIAGNOSIS — Z992 Dependence on renal dialysis: Secondary | ICD-10-CM | POA: Diagnosis not present

## 2017-05-09 DIAGNOSIS — D631 Anemia in chronic kidney disease: Secondary | ICD-10-CM | POA: Diagnosis not present

## 2017-05-09 DIAGNOSIS — D509 Iron deficiency anemia, unspecified: Secondary | ICD-10-CM | POA: Diagnosis not present

## 2017-05-10 DIAGNOSIS — D631 Anemia in chronic kidney disease: Secondary | ICD-10-CM | POA: Diagnosis not present

## 2017-05-10 DIAGNOSIS — D509 Iron deficiency anemia, unspecified: Secondary | ICD-10-CM | POA: Diagnosis not present

## 2017-05-10 DIAGNOSIS — Z992 Dependence on renal dialysis: Secondary | ICD-10-CM | POA: Diagnosis not present

## 2017-05-10 DIAGNOSIS — N186 End stage renal disease: Secondary | ICD-10-CM | POA: Diagnosis not present

## 2017-05-11 DIAGNOSIS — N186 End stage renal disease: Secondary | ICD-10-CM | POA: Diagnosis not present

## 2017-05-11 DIAGNOSIS — D509 Iron deficiency anemia, unspecified: Secondary | ICD-10-CM | POA: Diagnosis not present

## 2017-05-11 DIAGNOSIS — Z992 Dependence on renal dialysis: Secondary | ICD-10-CM | POA: Diagnosis not present

## 2017-05-11 DIAGNOSIS — D631 Anemia in chronic kidney disease: Secondary | ICD-10-CM | POA: Diagnosis not present

## 2017-05-12 DIAGNOSIS — Z992 Dependence on renal dialysis: Secondary | ICD-10-CM | POA: Diagnosis not present

## 2017-05-12 DIAGNOSIS — N186 End stage renal disease: Secondary | ICD-10-CM | POA: Diagnosis not present

## 2017-05-12 DIAGNOSIS — D631 Anemia in chronic kidney disease: Secondary | ICD-10-CM | POA: Diagnosis not present

## 2017-05-12 DIAGNOSIS — D509 Iron deficiency anemia, unspecified: Secondary | ICD-10-CM | POA: Diagnosis not present

## 2017-05-13 DIAGNOSIS — Z992 Dependence on renal dialysis: Secondary | ICD-10-CM | POA: Diagnosis not present

## 2017-05-13 DIAGNOSIS — N186 End stage renal disease: Secondary | ICD-10-CM | POA: Diagnosis not present

## 2017-05-13 DIAGNOSIS — D509 Iron deficiency anemia, unspecified: Secondary | ICD-10-CM | POA: Diagnosis not present

## 2017-05-13 DIAGNOSIS — D631 Anemia in chronic kidney disease: Secondary | ICD-10-CM | POA: Diagnosis not present

## 2017-05-15 DIAGNOSIS — D509 Iron deficiency anemia, unspecified: Secondary | ICD-10-CM | POA: Diagnosis not present

## 2017-05-15 DIAGNOSIS — N186 End stage renal disease: Secondary | ICD-10-CM | POA: Diagnosis not present

## 2017-05-15 DIAGNOSIS — D631 Anemia in chronic kidney disease: Secondary | ICD-10-CM | POA: Diagnosis not present

## 2017-05-15 DIAGNOSIS — Z992 Dependence on renal dialysis: Secondary | ICD-10-CM | POA: Diagnosis not present

## 2017-05-16 DIAGNOSIS — Z992 Dependence on renal dialysis: Secondary | ICD-10-CM | POA: Diagnosis not present

## 2017-05-16 DIAGNOSIS — D631 Anemia in chronic kidney disease: Secondary | ICD-10-CM | POA: Diagnosis not present

## 2017-05-16 DIAGNOSIS — D509 Iron deficiency anemia, unspecified: Secondary | ICD-10-CM | POA: Diagnosis not present

## 2017-05-16 DIAGNOSIS — N186 End stage renal disease: Secondary | ICD-10-CM | POA: Diagnosis not present

## 2017-05-17 DIAGNOSIS — D509 Iron deficiency anemia, unspecified: Secondary | ICD-10-CM | POA: Diagnosis not present

## 2017-05-17 DIAGNOSIS — Z992 Dependence on renal dialysis: Secondary | ICD-10-CM | POA: Diagnosis not present

## 2017-05-17 DIAGNOSIS — D631 Anemia in chronic kidney disease: Secondary | ICD-10-CM | POA: Diagnosis not present

## 2017-05-17 DIAGNOSIS — N186 End stage renal disease: Secondary | ICD-10-CM | POA: Diagnosis not present

## 2017-05-18 DIAGNOSIS — D631 Anemia in chronic kidney disease: Secondary | ICD-10-CM | POA: Diagnosis not present

## 2017-05-18 DIAGNOSIS — Z992 Dependence on renal dialysis: Secondary | ICD-10-CM | POA: Diagnosis not present

## 2017-05-18 DIAGNOSIS — D509 Iron deficiency anemia, unspecified: Secondary | ICD-10-CM | POA: Diagnosis not present

## 2017-05-18 DIAGNOSIS — N186 End stage renal disease: Secondary | ICD-10-CM | POA: Diagnosis not present

## 2017-05-19 DIAGNOSIS — N186 End stage renal disease: Secondary | ICD-10-CM | POA: Diagnosis not present

## 2017-05-19 DIAGNOSIS — D631 Anemia in chronic kidney disease: Secondary | ICD-10-CM | POA: Diagnosis not present

## 2017-05-19 DIAGNOSIS — Z992 Dependence on renal dialysis: Secondary | ICD-10-CM | POA: Diagnosis not present

## 2017-05-19 DIAGNOSIS — D509 Iron deficiency anemia, unspecified: Secondary | ICD-10-CM | POA: Diagnosis not present

## 2017-05-20 DIAGNOSIS — N186 End stage renal disease: Secondary | ICD-10-CM | POA: Diagnosis not present

## 2017-05-20 DIAGNOSIS — Z992 Dependence on renal dialysis: Secondary | ICD-10-CM | POA: Diagnosis not present

## 2017-05-20 DIAGNOSIS — D509 Iron deficiency anemia, unspecified: Secondary | ICD-10-CM | POA: Diagnosis not present

## 2017-05-20 DIAGNOSIS — D631 Anemia in chronic kidney disease: Secondary | ICD-10-CM | POA: Diagnosis not present

## 2017-05-21 DIAGNOSIS — N186 End stage renal disease: Secondary | ICD-10-CM | POA: Diagnosis not present

## 2017-05-21 DIAGNOSIS — Z992 Dependence on renal dialysis: Secondary | ICD-10-CM | POA: Diagnosis not present

## 2017-05-21 DIAGNOSIS — D509 Iron deficiency anemia, unspecified: Secondary | ICD-10-CM | POA: Diagnosis not present

## 2017-05-21 DIAGNOSIS — D631 Anemia in chronic kidney disease: Secondary | ICD-10-CM | POA: Diagnosis not present

## 2017-05-22 DIAGNOSIS — D631 Anemia in chronic kidney disease: Secondary | ICD-10-CM | POA: Diagnosis not present

## 2017-05-22 DIAGNOSIS — D509 Iron deficiency anemia, unspecified: Secondary | ICD-10-CM | POA: Diagnosis not present

## 2017-05-22 DIAGNOSIS — N186 End stage renal disease: Secondary | ICD-10-CM | POA: Diagnosis not present

## 2017-05-22 DIAGNOSIS — Z992 Dependence on renal dialysis: Secondary | ICD-10-CM | POA: Diagnosis not present

## 2017-05-23 DIAGNOSIS — D631 Anemia in chronic kidney disease: Secondary | ICD-10-CM | POA: Diagnosis not present

## 2017-05-23 DIAGNOSIS — Z992 Dependence on renal dialysis: Secondary | ICD-10-CM | POA: Diagnosis not present

## 2017-05-23 DIAGNOSIS — D509 Iron deficiency anemia, unspecified: Secondary | ICD-10-CM | POA: Diagnosis not present

## 2017-05-23 DIAGNOSIS — N186 End stage renal disease: Secondary | ICD-10-CM | POA: Diagnosis not present

## 2017-05-24 DIAGNOSIS — D509 Iron deficiency anemia, unspecified: Secondary | ICD-10-CM | POA: Diagnosis not present

## 2017-05-24 DIAGNOSIS — Z992 Dependence on renal dialysis: Secondary | ICD-10-CM | POA: Diagnosis not present

## 2017-05-24 DIAGNOSIS — N186 End stage renal disease: Secondary | ICD-10-CM | POA: Diagnosis not present

## 2017-05-24 DIAGNOSIS — D631 Anemia in chronic kidney disease: Secondary | ICD-10-CM | POA: Diagnosis not present

## 2017-05-25 DIAGNOSIS — Z823 Family history of stroke: Secondary | ICD-10-CM | POA: Diagnosis not present

## 2017-05-25 DIAGNOSIS — N186 End stage renal disease: Secondary | ICD-10-CM | POA: Diagnosis not present

## 2017-05-25 DIAGNOSIS — I120 Hypertensive chronic kidney disease with stage 5 chronic kidney disease or end stage renal disease: Secondary | ICD-10-CM | POA: Diagnosis not present

## 2017-05-25 DIAGNOSIS — D649 Anemia, unspecified: Secondary | ICD-10-CM | POA: Diagnosis not present

## 2017-05-25 DIAGNOSIS — Z7982 Long term (current) use of aspirin: Secondary | ICD-10-CM | POA: Diagnosis not present

## 2017-05-25 DIAGNOSIS — R072 Precordial pain: Secondary | ICD-10-CM | POA: Diagnosis not present

## 2017-05-25 DIAGNOSIS — J9811 Atelectasis: Secondary | ICD-10-CM | POA: Diagnosis not present

## 2017-05-25 DIAGNOSIS — E1122 Type 2 diabetes mellitus with diabetic chronic kidney disease: Secondary | ICD-10-CM | POA: Diagnosis not present

## 2017-05-25 DIAGNOSIS — R188 Other ascites: Secondary | ICD-10-CM | POA: Diagnosis not present

## 2017-05-25 DIAGNOSIS — Z992 Dependence on renal dialysis: Secondary | ICD-10-CM | POA: Diagnosis not present

## 2017-05-25 DIAGNOSIS — K649 Unspecified hemorrhoids: Secondary | ICD-10-CM | POA: Diagnosis not present

## 2017-05-25 DIAGNOSIS — Z6841 Body Mass Index (BMI) 40.0 and over, adult: Secondary | ICD-10-CM | POA: Diagnosis not present

## 2017-05-25 DIAGNOSIS — R079 Chest pain, unspecified: Secondary | ICD-10-CM | POA: Diagnosis not present

## 2017-05-25 DIAGNOSIS — E875 Hyperkalemia: Secondary | ICD-10-CM | POA: Diagnosis not present

## 2017-05-25 DIAGNOSIS — K8020 Calculus of gallbladder without cholecystitis without obstruction: Secondary | ICD-10-CM | POA: Diagnosis not present

## 2017-05-25 DIAGNOSIS — I517 Cardiomegaly: Secondary | ICD-10-CM | POA: Diagnosis not present

## 2017-05-25 DIAGNOSIS — D631 Anemia in chronic kidney disease: Secondary | ICD-10-CM | POA: Diagnosis not present

## 2017-05-25 DIAGNOSIS — Z87891 Personal history of nicotine dependence: Secondary | ICD-10-CM | POA: Diagnosis not present

## 2017-05-26 DIAGNOSIS — Z992 Dependence on renal dialysis: Secondary | ICD-10-CM

## 2017-05-26 DIAGNOSIS — D649 Anemia, unspecified: Secondary | ICD-10-CM | POA: Diagnosis not present

## 2017-05-26 DIAGNOSIS — R9341 Abnormal radiologic findings on diagnostic imaging of renal pelvis, ureter, or bladder: Secondary | ICD-10-CM | POA: Diagnosis not present

## 2017-05-26 DIAGNOSIS — K8020 Calculus of gallbladder without cholecystitis without obstruction: Secondary | ICD-10-CM | POA: Diagnosis not present

## 2017-05-26 DIAGNOSIS — R0602 Shortness of breath: Secondary | ICD-10-CM | POA: Diagnosis not present

## 2017-05-26 DIAGNOSIS — Z4902 Encounter for fitting and adjustment of peritoneal dialysis catheter: Secondary | ICD-10-CM | POA: Diagnosis not present

## 2017-05-26 DIAGNOSIS — R079 Chest pain, unspecified: Secondary | ICD-10-CM | POA: Insufficient documentation

## 2017-05-26 DIAGNOSIS — E875 Hyperkalemia: Secondary | ICD-10-CM | POA: Diagnosis not present

## 2017-05-26 DIAGNOSIS — E119 Type 2 diabetes mellitus without complications: Secondary | ICD-10-CM | POA: Diagnosis not present

## 2017-05-26 DIAGNOSIS — R109 Unspecified abdominal pain: Secondary | ICD-10-CM | POA: Diagnosis not present

## 2017-05-26 DIAGNOSIS — R918 Other nonspecific abnormal finding of lung field: Secondary | ICD-10-CM | POA: Diagnosis not present

## 2017-05-26 DIAGNOSIS — I120 Hypertensive chronic kidney disease with stage 5 chronic kidney disease or end stage renal disease: Secondary | ICD-10-CM | POA: Diagnosis not present

## 2017-05-26 DIAGNOSIS — J90 Pleural effusion, not elsewhere classified: Secondary | ICD-10-CM | POA: Diagnosis not present

## 2017-05-26 DIAGNOSIS — J9811 Atelectasis: Secondary | ICD-10-CM | POA: Diagnosis not present

## 2017-05-26 DIAGNOSIS — R188 Other ascites: Secondary | ICD-10-CM | POA: Diagnosis not present

## 2017-05-26 DIAGNOSIS — N186 End stage renal disease: Secondary | ICD-10-CM | POA: Diagnosis not present

## 2017-05-27 DIAGNOSIS — D631 Anemia in chronic kidney disease: Secondary | ICD-10-CM | POA: Diagnosis not present

## 2017-05-27 DIAGNOSIS — D509 Iron deficiency anemia, unspecified: Secondary | ICD-10-CM | POA: Diagnosis not present

## 2017-05-27 DIAGNOSIS — E119 Type 2 diabetes mellitus without complications: Secondary | ICD-10-CM | POA: Diagnosis not present

## 2017-05-27 DIAGNOSIS — N186 End stage renal disease: Secondary | ICD-10-CM | POA: Diagnosis not present

## 2017-05-27 DIAGNOSIS — R079 Chest pain, unspecified: Secondary | ICD-10-CM | POA: Diagnosis not present

## 2017-05-27 DIAGNOSIS — I120 Hypertensive chronic kidney disease with stage 5 chronic kidney disease or end stage renal disease: Secondary | ICD-10-CM | POA: Diagnosis not present

## 2017-05-27 DIAGNOSIS — D649 Anemia, unspecified: Secondary | ICD-10-CM | POA: Diagnosis not present

## 2017-05-27 DIAGNOSIS — Z992 Dependence on renal dialysis: Secondary | ICD-10-CM | POA: Diagnosis not present

## 2017-05-28 DIAGNOSIS — D509 Iron deficiency anemia, unspecified: Secondary | ICD-10-CM | POA: Diagnosis not present

## 2017-05-28 DIAGNOSIS — N186 End stage renal disease: Secondary | ICD-10-CM | POA: Diagnosis not present

## 2017-05-28 DIAGNOSIS — Z992 Dependence on renal dialysis: Secondary | ICD-10-CM | POA: Diagnosis not present

## 2017-05-28 DIAGNOSIS — D631 Anemia in chronic kidney disease: Secondary | ICD-10-CM | POA: Diagnosis not present

## 2017-05-29 DIAGNOSIS — D631 Anemia in chronic kidney disease: Secondary | ICD-10-CM | POA: Diagnosis not present

## 2017-05-29 DIAGNOSIS — N186 End stage renal disease: Secondary | ICD-10-CM | POA: Diagnosis not present

## 2017-05-29 DIAGNOSIS — D509 Iron deficiency anemia, unspecified: Secondary | ICD-10-CM | POA: Diagnosis not present

## 2017-05-29 DIAGNOSIS — Z992 Dependence on renal dialysis: Secondary | ICD-10-CM | POA: Diagnosis not present

## 2017-05-30 DIAGNOSIS — Z992 Dependence on renal dialysis: Secondary | ICD-10-CM | POA: Diagnosis not present

## 2017-05-30 DIAGNOSIS — D631 Anemia in chronic kidney disease: Secondary | ICD-10-CM | POA: Diagnosis not present

## 2017-05-30 DIAGNOSIS — N186 End stage renal disease: Secondary | ICD-10-CM | POA: Diagnosis not present

## 2017-05-30 DIAGNOSIS — D509 Iron deficiency anemia, unspecified: Secondary | ICD-10-CM | POA: Diagnosis not present

## 2017-05-31 DIAGNOSIS — D509 Iron deficiency anemia, unspecified: Secondary | ICD-10-CM | POA: Diagnosis not present

## 2017-05-31 DIAGNOSIS — Z992 Dependence on renal dialysis: Secondary | ICD-10-CM | POA: Diagnosis not present

## 2017-05-31 DIAGNOSIS — N186 End stage renal disease: Secondary | ICD-10-CM | POA: Diagnosis not present

## 2017-05-31 DIAGNOSIS — D631 Anemia in chronic kidney disease: Secondary | ICD-10-CM | POA: Diagnosis not present

## 2017-06-01 DIAGNOSIS — Z992 Dependence on renal dialysis: Secondary | ICD-10-CM | POA: Diagnosis not present

## 2017-06-01 DIAGNOSIS — D631 Anemia in chronic kidney disease: Secondary | ICD-10-CM | POA: Diagnosis not present

## 2017-06-01 DIAGNOSIS — D509 Iron deficiency anemia, unspecified: Secondary | ICD-10-CM | POA: Diagnosis not present

## 2017-06-01 DIAGNOSIS — N186 End stage renal disease: Secondary | ICD-10-CM | POA: Diagnosis not present

## 2017-06-02 DIAGNOSIS — D509 Iron deficiency anemia, unspecified: Secondary | ICD-10-CM | POA: Diagnosis not present

## 2017-06-02 DIAGNOSIS — D631 Anemia in chronic kidney disease: Secondary | ICD-10-CM | POA: Diagnosis not present

## 2017-06-02 DIAGNOSIS — Z992 Dependence on renal dialysis: Secondary | ICD-10-CM | POA: Diagnosis not present

## 2017-06-02 DIAGNOSIS — N186 End stage renal disease: Secondary | ICD-10-CM | POA: Diagnosis not present

## 2017-06-03 DIAGNOSIS — N186 End stage renal disease: Secondary | ICD-10-CM | POA: Diagnosis not present

## 2017-06-03 DIAGNOSIS — D631 Anemia in chronic kidney disease: Secondary | ICD-10-CM | POA: Diagnosis not present

## 2017-06-03 DIAGNOSIS — D509 Iron deficiency anemia, unspecified: Secondary | ICD-10-CM | POA: Diagnosis not present

## 2017-06-03 DIAGNOSIS — Z992 Dependence on renal dialysis: Secondary | ICD-10-CM | POA: Diagnosis not present

## 2017-06-04 DIAGNOSIS — D509 Iron deficiency anemia, unspecified: Secondary | ICD-10-CM | POA: Diagnosis not present

## 2017-06-04 DIAGNOSIS — D631 Anemia in chronic kidney disease: Secondary | ICD-10-CM | POA: Diagnosis not present

## 2017-06-04 DIAGNOSIS — Z992 Dependence on renal dialysis: Secondary | ICD-10-CM | POA: Diagnosis not present

## 2017-06-04 DIAGNOSIS — N186 End stage renal disease: Secondary | ICD-10-CM | POA: Diagnosis not present

## 2017-06-05 DIAGNOSIS — Z992 Dependence on renal dialysis: Secondary | ICD-10-CM | POA: Diagnosis not present

## 2017-06-05 DIAGNOSIS — N186 End stage renal disease: Secondary | ICD-10-CM | POA: Diagnosis not present

## 2017-06-05 DIAGNOSIS — D631 Anemia in chronic kidney disease: Secondary | ICD-10-CM | POA: Diagnosis not present

## 2017-06-05 DIAGNOSIS — D509 Iron deficiency anemia, unspecified: Secondary | ICD-10-CM | POA: Diagnosis not present

## 2017-06-06 DIAGNOSIS — Z992 Dependence on renal dialysis: Secondary | ICD-10-CM | POA: Diagnosis not present

## 2017-06-06 DIAGNOSIS — N186 End stage renal disease: Secondary | ICD-10-CM | POA: Diagnosis not present

## 2017-06-06 DIAGNOSIS — D631 Anemia in chronic kidney disease: Secondary | ICD-10-CM | POA: Diagnosis not present

## 2017-06-06 DIAGNOSIS — D509 Iron deficiency anemia, unspecified: Secondary | ICD-10-CM | POA: Diagnosis not present

## 2017-06-07 DIAGNOSIS — N186 End stage renal disease: Secondary | ICD-10-CM | POA: Diagnosis not present

## 2017-06-07 DIAGNOSIS — D509 Iron deficiency anemia, unspecified: Secondary | ICD-10-CM | POA: Diagnosis not present

## 2017-06-07 DIAGNOSIS — D631 Anemia in chronic kidney disease: Secondary | ICD-10-CM | POA: Diagnosis not present

## 2017-06-07 DIAGNOSIS — Z992 Dependence on renal dialysis: Secondary | ICD-10-CM | POA: Diagnosis not present

## 2017-06-08 DIAGNOSIS — D631 Anemia in chronic kidney disease: Secondary | ICD-10-CM | POA: Diagnosis not present

## 2017-06-08 DIAGNOSIS — D509 Iron deficiency anemia, unspecified: Secondary | ICD-10-CM | POA: Diagnosis not present

## 2017-06-08 DIAGNOSIS — N186 End stage renal disease: Secondary | ICD-10-CM | POA: Diagnosis not present

## 2017-06-08 DIAGNOSIS — Z992 Dependence on renal dialysis: Secondary | ICD-10-CM | POA: Diagnosis not present

## 2017-06-09 DIAGNOSIS — D509 Iron deficiency anemia, unspecified: Secondary | ICD-10-CM | POA: Diagnosis not present

## 2017-06-09 DIAGNOSIS — D631 Anemia in chronic kidney disease: Secondary | ICD-10-CM | POA: Diagnosis not present

## 2017-06-09 DIAGNOSIS — Z992 Dependence on renal dialysis: Secondary | ICD-10-CM | POA: Diagnosis not present

## 2017-06-09 DIAGNOSIS — N186 End stage renal disease: Secondary | ICD-10-CM | POA: Diagnosis not present

## 2017-06-10 ENCOUNTER — Other Ambulatory Visit: Payer: Self-pay

## 2017-06-10 ENCOUNTER — Emergency Department
Admission: EM | Admit: 2017-06-10 | Discharge: 2017-06-11 | Disposition: A | Discharge status: Home / Self Care | Payer: Medicare Other | Attending: Emergency Medicine | Admitting: Emergency Medicine

## 2017-06-10 ENCOUNTER — Encounter: Payer: Self-pay | Admitting: Emergency Medicine

## 2017-06-10 ENCOUNTER — Emergency Department: Payer: Medicare Other

## 2017-06-10 DIAGNOSIS — J181 Lobar pneumonia, unspecified organism: Secondary | ICD-10-CM | POA: Diagnosis not present

## 2017-06-10 DIAGNOSIS — E1122 Type 2 diabetes mellitus with diabetic chronic kidney disease: Secondary | ICD-10-CM | POA: Insufficient documentation

## 2017-06-10 DIAGNOSIS — F17200 Nicotine dependence, unspecified, uncomplicated: Secondary | ICD-10-CM | POA: Diagnosis not present

## 2017-06-10 DIAGNOSIS — J189 Pneumonia, unspecified organism: Secondary | ICD-10-CM | POA: Diagnosis not present

## 2017-06-10 DIAGNOSIS — Z79899 Other long term (current) drug therapy: Secondary | ICD-10-CM | POA: Diagnosis not present

## 2017-06-10 DIAGNOSIS — D631 Anemia in chronic kidney disease: Secondary | ICD-10-CM | POA: Diagnosis not present

## 2017-06-10 DIAGNOSIS — N186 End stage renal disease: Secondary | ICD-10-CM | POA: Insufficient documentation

## 2017-06-10 DIAGNOSIS — R0602 Shortness of breath: Secondary | ICD-10-CM | POA: Diagnosis not present

## 2017-06-10 DIAGNOSIS — I120 Hypertensive chronic kidney disease with stage 5 chronic kidney disease or end stage renal disease: Secondary | ICD-10-CM | POA: Insufficient documentation

## 2017-06-10 DIAGNOSIS — Z992 Dependence on renal dialysis: Secondary | ICD-10-CM | POA: Diagnosis not present

## 2017-06-10 DIAGNOSIS — R079 Chest pain, unspecified: Secondary | ICD-10-CM | POA: Diagnosis not present

## 2017-06-10 DIAGNOSIS — F17210 Nicotine dependence, cigarettes, uncomplicated: Secondary | ICD-10-CM | POA: Diagnosis not present

## 2017-06-10 DIAGNOSIS — D509 Iron deficiency anemia, unspecified: Secondary | ICD-10-CM | POA: Diagnosis not present

## 2017-06-10 HISTORY — DX: Essential (primary) hypertension: I10

## 2017-06-10 HISTORY — DX: Type 2 diabetes mellitus without complications: E11.9

## 2017-06-10 HISTORY — DX: Disorder of kidney and ureter, unspecified: N28.9

## 2017-06-10 LAB — BASIC METABOLIC PANEL
Anion gap: 19 — ABNORMAL HIGH (ref 5–15)
BUN: 90 mg/dL — AB (ref 6–20)
CALCIUM: 8 mg/dL — AB (ref 8.9–10.3)
CO2: 23 mmol/L (ref 22–32)
CREATININE: 18.62 mg/dL — AB (ref 0.61–1.24)
Chloride: 90 mmol/L — ABNORMAL LOW (ref 101–111)
GFR calc non Af Amer: 3 mL/min — ABNORMAL LOW (ref >60)
GFR, EST AFRICAN AMERICAN: 3 mL/min — AB (ref >60)
GLUCOSE: 273 mg/dL — AB (ref 65–99)
Potassium: 5.6 mmol/L — ABNORMAL HIGH (ref 3.5–5.1)
Sodium: 132 mmol/L — ABNORMAL LOW (ref 135–145)

## 2017-06-10 LAB — CBC
HCT: 23.6 % — ABNORMAL LOW (ref 40.0–52.0)
Hemoglobin: 7.7 g/dL — ABNORMAL LOW (ref 13.0–18.0)
MCH: 32 pg (ref 26.0–34.0)
MCHC: 32.6 g/dL (ref 32.0–36.0)
MCV: 98.1 fL (ref 80.0–100.0)
PLATELETS: 194 10*3/uL (ref 150–440)
RBC: 2.4 MIL/uL — AB (ref 4.40–5.90)
RDW: 18.1 % — AB (ref 11.5–14.5)
WBC: 9.5 10*3/uL (ref 3.8–10.6)

## 2017-06-10 LAB — TROPONIN I: TROPONIN I: 0.03 ng/mL — AB (ref <0.03)

## 2017-06-10 NOTE — ED Triage Notes (Signed)
Pt reports having a cold x 2 weeks ago and getting his flu shot x3 days after cold started. Pt believes that the flu shot caused his flu. Pt had some chest pain upon coughing but none when he does not cough. Pt ambulatory to [facility] and out of breath upon return. Pt is able to talk in complete sentences at this time.

## 2017-06-10 NOTE — ED Notes (Signed)
Date and time results received: 06/10/17 2259 (use smartphrase ".now" to insert current time)  Test: Trop I Critical Value: 0.03  Name of Provider Notified: Lord  Orders Received? Or Actions Taken?: No new orders

## 2017-06-10 NOTE — ED Notes (Signed)
Pt here with [place] [facility] with chest pressure and shob when exerting self for 2 weeks. Pt gets dialysis at home per ems. 142/77, 91, pox 95% on ra per ems, fsbs 300 per ems.

## 2017-06-11 DIAGNOSIS — J181 Lobar pneumonia, unspecified organism: Secondary | ICD-10-CM | POA: Diagnosis not present

## 2017-06-11 DIAGNOSIS — Z992 Dependence on renal dialysis: Secondary | ICD-10-CM | POA: Diagnosis not present

## 2017-06-11 DIAGNOSIS — N186 End stage renal disease: Secondary | ICD-10-CM | POA: Diagnosis not present

## 2017-06-11 DIAGNOSIS — D631 Anemia in chronic kidney disease: Secondary | ICD-10-CM | POA: Diagnosis not present

## 2017-06-11 DIAGNOSIS — D509 Iron deficiency anemia, unspecified: Secondary | ICD-10-CM | POA: Diagnosis not present

## 2017-06-11 LAB — BRAIN NATRIURETIC PEPTIDE: B Natriuretic Peptide: 244 pg/mL — ABNORMAL HIGH (ref 0.0–100.0)

## 2017-06-11 LAB — TROPONIN I: Troponin I: <0.03 ng/mL (ref <0.03)

## 2017-06-11 MED ORDER — AZITHROMYCIN 250 MG PO TABS: ORAL_TABLET | ORAL | 0 refills | Status: AC

## 2017-06-11 MED ORDER — AMOXICILLIN 500 MG PO TABS: 500.0000 mg | ORAL_TABLET | Freq: Two times a day (BID) | ORAL | 0 refills | Status: AC

## 2017-06-11 NOTE — Discharge Instructions (Addendum)
Please take both of your antibiotics as prescribed and follow-up with your primary care physician this coming Monday for recheck.  Return to the emergency department sooner for any new or worsening symptoms such as worsening shortness of breath, fevers, chills, or for any other issues whatsoever.  It was a pleasure to take care of you today, and thank you for coming to our emergency department.  If you have any questions or concerns before leaving please ask the nurse to grab me and I'm more than happy to go through your aftercare instructions again.  If you were prescribed any opioid pain medication today such as Norco, Vicodin, Percocet, morphine, hydrocodone, or oxycodone please make sure you do not drive when you are taking this medication as it can alter your ability to drive safely.  If you have any concerns once you are home that you are not improving or are in fact getting worse before you can make it to your follow-up appointment, please do not hesitate to call 911 and come back for further evaluation.  Darel Hong, MD  Results for orders placed or performed during the hospital encounter of 21/22/48  Basic metabolic panel  Result Value Ref Range   Sodium 132 (L) 135 - 145 mmol/L   Potassium 5.6 (H) 3.5 - 5.1 mmol/L   Chloride 90 (L) 101 - 111 mmol/L   CO2 23 22 - 32 mmol/L   Glucose, Bld 273 (H) 65 - 99 mg/dL   BUN 90 (H) 6 - 20 mg/dL   Creatinine, Ser 18.62 (H) 0.61 - 1.24 mg/dL   Calcium 8.0 (L) 8.9 - 10.3 mg/dL   GFR calc non Af Amer 3 (L) >60 mL/min   GFR calc Af Amer 3 (L) >60 mL/min   Anion gap 19 (H) 5 - 15  CBC  Result Value Ref Range   WBC 9.5 3.8 - 10.6 K/uL   RBC 2.40 (L) 4.40 - 5.90 MIL/uL   Hemoglobin 7.7 (L) 13.0 - 18.0 g/dL   HCT 23.6 (L) 40.0 - 52.0 %   MCV 98.1 80.0 - 100.0 fL   MCH 32.0 26.0 - 34.0 pg   MCHC 32.6 32.0 - 36.0 g/dL   RDW 18.1 (H) 11.5 - 14.5 %   Platelets 194 150 - 440 K/uL  Troponin I  Result Value Ref Range   Troponin I 0.03 (HH)  <0.03 ng/mL  Brain natriuretic peptide  Result Value Ref Range   B Natriuretic Peptide 244.0 (H) 0.0 - 100.0 pg/mL  Troponin I  Result Value Ref Range   Troponin I <0.03 <0.03 ng/mL   Dg Chest 2 View  Result Date: 06/10/2017 CLINICAL DATA:  Cold for 2 weeks, flu shot chest pain EXAM: CHEST  2 VIEW COMPARISON:  04/04/2014 FINDINGS: Low lung volumes. Small left pleural effusion and left lower lobe and lingular pneumonia. Normal heart size. No pneumothorax. Degenerative changes of the spine. IMPRESSION: Partial consolidations in the lingula and left lower lobe suspicious for pneumonia. Associated small left effusion. Suggest radiographic follow-up to clearing. Electronically Signed   By: Donavan Foil M.D.   On: 06/10/2017 22:55

## 2017-06-11 NOTE — ED Provider Notes (Signed)
[facility] Emergency Department Provider Note  ____________________________________________   First MD Initiated Contact with Patient 06/10/17 2248     (approximate)  I have reviewed the triage vital signs and the nursing notes.   HISTORY  Chief Complaint URI and Chest Pain    HPI Jake Gomez is a 46 y.o. male self presents to the emergency department with several days of insidious onset gradually progressive cough and exertional shortness of breath.  No fevers or chills.  He has a complex past medical history including hypertension diabetes and end-stage renal disease for which he performs peritoneal dialysis at home daily.  He does have some sharp upper chest pain worse with cough improved when not coughing nonradiating.  Past Medical History:  Diagnosis Date  . Diabetes mellitus without complication ([place])   . Hypertension   . Renal disorder     Patient Active Problem List   Diagnosis Date Noted  . Chest pain 05/26/2017  . ESRD on peritoneal dialysis ([place]) 05/26/2017  . Chronic renal disease, stage 4, severely decreased glomerular filtration rate between 15-29 mL/min/1.73 square meter ([place]) 01/30/2014  . Acute-on-chronic renal failure ([place]) 09/29/2013  . Diabetes mellitus ([place]) 09/29/2013  . Hyperkalemia, diminished renal excretion 09/29/2013  . Hypertension 09/29/2013    Past Surgical History:  Procedure Laterality Date  . peritonal dialysis      Prior to Admission medications   Medication Sig Start Date End Date Taking? Authorizing Provider  amLODipine (NORVASC) 5 MG tablet Take 10 mg by mouth 2 (two) times daily.  02/02/14  Yes [provider]  bumetanide (BUMEX) 2 MG tablet Take 2 mg by mouth daily.  02/16/17  Yes [provider]  calcium acetate (PHOSLO) 667 MG capsule Take 2,001 mg by mouth 3 (three) times daily with meals.  05/27/17  Yes [provider]  doxycycline (VIBRA-TABS) 100 MG tablet Take 100 mg by  mouth daily. 06/10/17  Yes [provider]  irbesartan (AVAPRO) 300 MG tablet Take 300 mg by mouth at bedtime.  05/14/17  Yes [provider]  labetalol (NORMODYNE) 200 MG tablet Take 200 mg by mouth 2 (two) times daily.    Yes [provider]  Vitamin D, Ergocalciferol, (DRISDOL) 50000 units CAPS capsule Take 50,000 Units by mouth every 7 (seven) days.    Yes [provider]  acetaminophen (TYLENOL) 500 MG tablet Take 500 mg by mouth.    [provider]  amoxicillin (AMOXIL) 500 MG tablet Take 1 tablet (500 mg total) by mouth 2 (two) times daily for 7 days. 06/11/17 06/18/17  Darel Hong, MD  azithromycin (ZITHROMAX Z-PAK) 250 MG tablet Take 2 tablets (500 mg) on  Day 1,  followed by 1 tablet (250 mg) once daily on Days 2 through 5. 06/11/17 06/16/17  Darel Hong, MD  labetalol (NORMODYNE) 100 MG tablet Take by mouth. 10/01/13   [provider]    Allergies Patient has no known allergies.  No family history on file.  Social History Social History   Tobacco Use  . Smoking status: Current Some Day Smoker  . Smokeless tobacco: Never Used  Substance Use Topics  . Alcohol use: No    Frequency: Never  . Drug use: No    Review of Systems Constitutional: No fever/chills Eyes: No visual changes. ENT: No sore throat. Cardiovascular: Positive for chest pain. Respiratory: Positive for shortness of breath. Gastrointestinal: No abdominal pain.  No nausea, no vomiting.  No diarrhea.  No constipation. Genitourinary: Negative  for dysuria. Musculoskeletal: Negative for back pain. Skin: Negative for rash. Neurological: Negative for headaches, focal weakness or numbness.   ____________________________________________   PHYSICAL EXAM:  VITAL SIGNS: ED Triage Vitals  Enc Vitals Group     BP 06/10/17 2222 (!) 144/61     Pulse Rate 06/10/17 2222 92     Resp 06/10/17 2222 (!) 24     Temp 06/10/17 2222 98.5 F (36.9 C)     Temp  Source 06/10/17 2222 Oral     SpO2 06/10/17 2222 94 %     Weight 06/10/17 2223 280 lb (127 kg)     Height 06/10/17 2223 5\' 9"  (1.753 m)     Head Circumference --      Peak Flow --      Pain Score 06/10/17 2222 10     Pain Loc --      Pain Edu? --      Excl. in [place]? --     Constitutional: Alert and oriented x4 well-appearing nontoxic no diaphoresis speaks full clear sentences Eyes: PERRL EOMI. Head: Atraumatic. Nose: No congestion/rhinnorhea. Mouth/Throat: No trismus Neck: No stridor.  Able to lie completely flat no JVD  Cardiovascular: Normal rate, regular rhythm. Grossly normal heart sounds.  Good peripheral circulation. Respiratory: Normal respiratory effort.  No retractions. Lungs CTAB and moving good air Gastrointestinal: Obese soft nontender PD catheter in place Musculoskeletal: No lower extremity edema   Neurologic:  Normal speech and language. No gross focal neurologic deficits are appreciated. Skin:  Skin is warm, dry and intact. No rash noted. Psychiatric: Mood and affect are normal. Speech and behavior are normal.    ____________________________________________   DIFFERENTIAL includes but not limited to  CHF exacerbation, fluid overload, acute coronary syndrome, pulmonary embolism, pneumonia, pneumothorax ____________________________________________   LABS (all labs ordered are listed, but only abnormal results are displayed)  Labs Reviewed  BASIC METABOLIC PANEL - Abnormal; Notable for the following components:      Result Value   Sodium 132 (*)    Potassium 5.6 (*)    Chloride 90 (*)    Glucose, Bld 273 (*)    BUN 90 (*)    Creatinine, Ser 18.62 (*)    Calcium 8.0 (*)    GFR calc non Af Amer 3 (*)    GFR calc Af Amer 3 (*)    Anion gap 19 (*)    All other components within normal limits  CBC - Abnormal; Notable for the following components:   RBC 2.40 (*)    Hemoglobin 7.7 (*)    HCT 23.6 (*)    RDW 18.1 (*)    All other components within normal  limits  TROPONIN I - Abnormal; Notable for the following components:   Troponin I 0.03 (*)    All other components within normal limits  BRAIN NATRIURETIC PEPTIDE - Abnormal; Notable for the following components:   B Natriuretic Peptide 244.0 (*)    All other components within normal limits  TROPONIN I    Lab work reviewed by me with elevated potassium although not requiring emergent dialysis first troponin slightly elevated second negative.  BNP only slightly elevated __________________________________________  EKG  ED ECG REPORT I, Darel Hong, the attending physician, personally viewed and interpreted this ECG.  Date: 06/10/2017 EKG Time:  Rate: 92 Rhythm: normal sinus rhythm QRS Axis: normal Intervals: normal ST/T Wave abnormalities: normal Narrative Interpretation: no evidence of acute ischemia.  Poor R wave progression  ____________________________________________  RADIOLOGY  Chest  x-ray reviewed by me concerning for pneumonia ____________________________________________   PROCEDURES  Procedure(s) performed: no  Procedures  Critical Care performed: no  Observation: no ____________________________________________   INITIAL IMPRESSION / ASSESSMENT AND PLAN / ED COURSE  Pertinent labs & imaging results that were available during my care of the patient were reviewed by me and considered in my medical decision making (see chart for details).       ----------------------------------------- 2:07 AM on 06/11/2017 -----------------------------------------  Fortunately the patient's second troponin is negative.  His x-ray is concerning for pneumonia.  I will treat him symptomatically with azithromycin and amoxicillin and refer him back to primary care this coming Monday.  He is saturating well on room air is able to ambulate without difficulty.  He will do his own peritoneal dialysis tonight.  Strict return precautions have been given and patient verbalized  understanding and agreement the plan. ____________________________________________   FINAL CLINICAL IMPRESSION(S) / ED DIAGNOSES  Final diagnoses:  Community acquired pneumonia of left lower lobe of lung ([place])      NEW MEDICATIONS STARTED DURING THIS VISIT:  This SmartLink is deprecated. Use AVSMEDLIST instead to display the medication list for a patient.   Note:  This document was prepared using Dragon voice recognition software and may include unintentional dictation errors.     Darel Hong, MD 06/11/17 2245

## 2017-06-12 DIAGNOSIS — J189 Pneumonia, unspecified organism: Secondary | ICD-10-CM | POA: Diagnosis present

## 2017-06-12 DIAGNOSIS — I517 Cardiomegaly: Secondary | ICD-10-CM | POA: Diagnosis not present

## 2017-06-12 DIAGNOSIS — R140 Abdominal distension (gaseous): Secondary | ICD-10-CM | POA: Diagnosis not present

## 2017-06-12 DIAGNOSIS — Z7982 Long term (current) use of aspirin: Secondary | ICD-10-CM | POA: Diagnosis not present

## 2017-06-12 DIAGNOSIS — J918 Pleural effusion in other conditions classified elsewhere: Secondary | ICD-10-CM | POA: Diagnosis present

## 2017-06-12 DIAGNOSIS — J939 Pneumothorax, unspecified: Secondary | ICD-10-CM | POA: Diagnosis not present

## 2017-06-12 DIAGNOSIS — I120 Hypertensive chronic kidney disease with stage 5 chronic kidney disease or end stage renal disease: Secondary | ICD-10-CM | POA: Diagnosis present

## 2017-06-12 DIAGNOSIS — Z87891 Personal history of nicotine dependence: Secondary | ICD-10-CM | POA: Diagnosis not present

## 2017-06-12 DIAGNOSIS — D649 Anemia, unspecified: Secondary | ICD-10-CM | POA: Diagnosis present

## 2017-06-12 DIAGNOSIS — Z992 Dependence on renal dialysis: Secondary | ICD-10-CM | POA: Diagnosis not present

## 2017-06-12 DIAGNOSIS — N2581 Secondary hyperparathyroidism of renal origin: Secondary | ICD-10-CM | POA: Diagnosis not present

## 2017-06-12 DIAGNOSIS — I311 Chronic constrictive pericarditis: Secondary | ICD-10-CM | POA: Diagnosis present

## 2017-06-12 DIAGNOSIS — Z79899 Other long term (current) drug therapy: Secondary | ICD-10-CM | POA: Diagnosis not present

## 2017-06-12 DIAGNOSIS — R633 Feeding difficulties: Secondary | ICD-10-CM | POA: Diagnosis not present

## 2017-06-12 DIAGNOSIS — D509 Iron deficiency anemia, unspecified: Secondary | ICD-10-CM | POA: Diagnosis not present

## 2017-06-12 DIAGNOSIS — R918 Other nonspecific abnormal finding of lung field: Secondary | ICD-10-CM | POA: Diagnosis not present

## 2017-06-12 DIAGNOSIS — I313 Pericardial effusion (noninflammatory): Secondary | ICD-10-CM | POA: Diagnosis not present

## 2017-06-12 DIAGNOSIS — E875 Hyperkalemia: Secondary | ICD-10-CM | POA: Diagnosis present

## 2017-06-12 DIAGNOSIS — R0600 Dyspnea, unspecified: Secondary | ICD-10-CM | POA: Diagnosis not present

## 2017-06-12 DIAGNOSIS — J90 Pleural effusion, not elsewhere classified: Secondary | ICD-10-CM | POA: Diagnosis present

## 2017-06-12 DIAGNOSIS — R0602 Shortness of breath: Secondary | ICD-10-CM | POA: Diagnosis not present

## 2017-06-12 DIAGNOSIS — E1122 Type 2 diabetes mellitus with diabetic chronic kidney disease: Secondary | ICD-10-CM | POA: Diagnosis present

## 2017-06-12 DIAGNOSIS — Z6841 Body Mass Index (BMI) 40.0 and over, adult: Secondary | ICD-10-CM | POA: Diagnosis not present

## 2017-06-12 DIAGNOSIS — R55 Syncope and collapse: Secondary | ICD-10-CM | POA: Diagnosis present

## 2017-06-12 DIAGNOSIS — E119 Type 2 diabetes mellitus without complications: Secondary | ICD-10-CM | POA: Diagnosis not present

## 2017-06-12 DIAGNOSIS — R1310 Dysphagia, unspecified: Secondary | ICD-10-CM | POA: Diagnosis not present

## 2017-06-12 DIAGNOSIS — D631 Anemia in chronic kidney disease: Secondary | ICD-10-CM | POA: Diagnosis not present

## 2017-06-12 DIAGNOSIS — N186 End stage renal disease: Secondary | ICD-10-CM | POA: Diagnosis present

## 2017-06-13 ENCOUNTER — Ambulatory Visit: Payer: Self-pay | Admitting: Surgery

## 2017-06-13 DIAGNOSIS — J90 Pleural effusion, not elsewhere classified: Secondary | ICD-10-CM | POA: Insufficient documentation

## 2017-06-13 DIAGNOSIS — N186 End stage renal disease: Secondary | ICD-10-CM | POA: Diagnosis not present

## 2017-06-13 DIAGNOSIS — J189 Pneumonia, unspecified organism: Secondary | ICD-10-CM | POA: Insufficient documentation

## 2017-06-13 DIAGNOSIS — Z992 Dependence on renal dialysis: Secondary | ICD-10-CM | POA: Diagnosis not present

## 2017-06-17 DIAGNOSIS — Z992 Dependence on renal dialysis: Secondary | ICD-10-CM | POA: Diagnosis not present

## 2017-06-17 DIAGNOSIS — N2581 Secondary hyperparathyroidism of renal origin: Secondary | ICD-10-CM | POA: Diagnosis not present

## 2017-06-17 DIAGNOSIS — D509 Iron deficiency anemia, unspecified: Secondary | ICD-10-CM | POA: Diagnosis not present

## 2017-06-17 DIAGNOSIS — N186 End stage renal disease: Secondary | ICD-10-CM | POA: Diagnosis not present

## 2017-06-17 DIAGNOSIS — D631 Anemia in chronic kidney disease: Secondary | ICD-10-CM | POA: Diagnosis not present

## 2017-06-18 DIAGNOSIS — N2581 Secondary hyperparathyroidism of renal origin: Secondary | ICD-10-CM | POA: Diagnosis not present

## 2017-06-18 DIAGNOSIS — D509 Iron deficiency anemia, unspecified: Secondary | ICD-10-CM | POA: Diagnosis not present

## 2017-06-18 DIAGNOSIS — Z992 Dependence on renal dialysis: Secondary | ICD-10-CM | POA: Diagnosis not present

## 2017-06-18 DIAGNOSIS — D631 Anemia in chronic kidney disease: Secondary | ICD-10-CM | POA: Diagnosis not present

## 2017-06-18 DIAGNOSIS — N186 End stage renal disease: Secondary | ICD-10-CM | POA: Diagnosis not present

## 2017-06-19 DIAGNOSIS — N186 End stage renal disease: Secondary | ICD-10-CM | POA: Diagnosis not present

## 2017-06-19 DIAGNOSIS — D509 Iron deficiency anemia, unspecified: Secondary | ICD-10-CM | POA: Diagnosis not present

## 2017-06-19 DIAGNOSIS — D631 Anemia in chronic kidney disease: Secondary | ICD-10-CM | POA: Diagnosis not present

## 2017-06-19 DIAGNOSIS — Z992 Dependence on renal dialysis: Secondary | ICD-10-CM | POA: Diagnosis not present

## 2017-06-19 DIAGNOSIS — N2581 Secondary hyperparathyroidism of renal origin: Secondary | ICD-10-CM | POA: Diagnosis not present

## 2017-06-20 DIAGNOSIS — Z992 Dependence on renal dialysis: Secondary | ICD-10-CM | POA: Diagnosis not present

## 2017-06-20 DIAGNOSIS — D631 Anemia in chronic kidney disease: Secondary | ICD-10-CM | POA: Diagnosis not present

## 2017-06-20 DIAGNOSIS — D509 Iron deficiency anemia, unspecified: Secondary | ICD-10-CM | POA: Diagnosis not present

## 2017-06-20 DIAGNOSIS — N2581 Secondary hyperparathyroidism of renal origin: Secondary | ICD-10-CM | POA: Diagnosis not present

## 2017-06-20 DIAGNOSIS — N186 End stage renal disease: Secondary | ICD-10-CM | POA: Diagnosis not present

## 2017-06-21 DIAGNOSIS — D509 Iron deficiency anemia, unspecified: Secondary | ICD-10-CM | POA: Diagnosis not present

## 2017-06-21 DIAGNOSIS — N2581 Secondary hyperparathyroidism of renal origin: Secondary | ICD-10-CM | POA: Diagnosis not present

## 2017-06-21 DIAGNOSIS — D631 Anemia in chronic kidney disease: Secondary | ICD-10-CM | POA: Diagnosis not present

## 2017-06-21 DIAGNOSIS — Z992 Dependence on renal dialysis: Secondary | ICD-10-CM | POA: Diagnosis not present

## 2017-06-21 DIAGNOSIS — N186 End stage renal disease: Secondary | ICD-10-CM | POA: Diagnosis not present

## 2017-06-22 DIAGNOSIS — N2581 Secondary hyperparathyroidism of renal origin: Secondary | ICD-10-CM | POA: Diagnosis not present

## 2017-06-22 DIAGNOSIS — D509 Iron deficiency anemia, unspecified: Secondary | ICD-10-CM | POA: Diagnosis not present

## 2017-06-22 DIAGNOSIS — D631 Anemia in chronic kidney disease: Secondary | ICD-10-CM | POA: Diagnosis not present

## 2017-06-22 DIAGNOSIS — N186 End stage renal disease: Secondary | ICD-10-CM | POA: Diagnosis not present

## 2017-06-22 DIAGNOSIS — Z992 Dependence on renal dialysis: Secondary | ICD-10-CM | POA: Diagnosis not present

## 2017-06-23 DIAGNOSIS — Z992 Dependence on renal dialysis: Secondary | ICD-10-CM | POA: Diagnosis not present

## 2017-06-23 DIAGNOSIS — D631 Anemia in chronic kidney disease: Secondary | ICD-10-CM | POA: Diagnosis not present

## 2017-06-23 DIAGNOSIS — N2581 Secondary hyperparathyroidism of renal origin: Secondary | ICD-10-CM | POA: Diagnosis not present

## 2017-06-23 DIAGNOSIS — D509 Iron deficiency anemia, unspecified: Secondary | ICD-10-CM | POA: Diagnosis not present

## 2017-06-23 DIAGNOSIS — N186 End stage renal disease: Secondary | ICD-10-CM | POA: Diagnosis not present

## 2017-06-24 DIAGNOSIS — N2581 Secondary hyperparathyroidism of renal origin: Secondary | ICD-10-CM | POA: Diagnosis not present

## 2017-06-24 DIAGNOSIS — D631 Anemia in chronic kidney disease: Secondary | ICD-10-CM | POA: Diagnosis not present

## 2017-06-24 DIAGNOSIS — N186 End stage renal disease: Secondary | ICD-10-CM | POA: Diagnosis not present

## 2017-06-24 DIAGNOSIS — Z992 Dependence on renal dialysis: Secondary | ICD-10-CM | POA: Diagnosis not present

## 2017-06-24 DIAGNOSIS — D509 Iron deficiency anemia, unspecified: Secondary | ICD-10-CM | POA: Diagnosis not present

## 2017-06-25 DIAGNOSIS — N2581 Secondary hyperparathyroidism of renal origin: Secondary | ICD-10-CM | POA: Diagnosis not present

## 2017-06-25 DIAGNOSIS — D509 Iron deficiency anemia, unspecified: Secondary | ICD-10-CM | POA: Diagnosis not present

## 2017-06-25 DIAGNOSIS — D631 Anemia in chronic kidney disease: Secondary | ICD-10-CM | POA: Diagnosis not present

## 2017-06-25 DIAGNOSIS — N186 End stage renal disease: Secondary | ICD-10-CM | POA: Diagnosis not present

## 2017-06-25 DIAGNOSIS — Z992 Dependence on renal dialysis: Secondary | ICD-10-CM | POA: Diagnosis not present

## 2017-06-26 DIAGNOSIS — Z992 Dependence on renal dialysis: Secondary | ICD-10-CM | POA: Diagnosis not present

## 2017-06-26 DIAGNOSIS — D631 Anemia in chronic kidney disease: Secondary | ICD-10-CM | POA: Diagnosis not present

## 2017-06-26 DIAGNOSIS — N2581 Secondary hyperparathyroidism of renal origin: Secondary | ICD-10-CM | POA: Diagnosis not present

## 2017-06-26 DIAGNOSIS — N186 End stage renal disease: Secondary | ICD-10-CM | POA: Diagnosis not present

## 2017-06-26 DIAGNOSIS — D509 Iron deficiency anemia, unspecified: Secondary | ICD-10-CM | POA: Diagnosis not present

## 2017-06-27 DIAGNOSIS — D631 Anemia in chronic kidney disease: Secondary | ICD-10-CM | POA: Diagnosis not present

## 2017-06-27 DIAGNOSIS — N2581 Secondary hyperparathyroidism of renal origin: Secondary | ICD-10-CM | POA: Diagnosis not present

## 2017-06-27 DIAGNOSIS — N186 End stage renal disease: Secondary | ICD-10-CM | POA: Diagnosis not present

## 2017-06-27 DIAGNOSIS — D509 Iron deficiency anemia, unspecified: Secondary | ICD-10-CM | POA: Diagnosis not present

## 2017-06-27 DIAGNOSIS — Z992 Dependence on renal dialysis: Secondary | ICD-10-CM | POA: Diagnosis not present

## 2017-06-28 DIAGNOSIS — N186 End stage renal disease: Secondary | ICD-10-CM | POA: Diagnosis not present

## 2017-06-28 DIAGNOSIS — N2581 Secondary hyperparathyroidism of renal origin: Secondary | ICD-10-CM | POA: Diagnosis not present

## 2017-06-28 DIAGNOSIS — D631 Anemia in chronic kidney disease: Secondary | ICD-10-CM | POA: Diagnosis not present

## 2017-06-28 DIAGNOSIS — Z992 Dependence on renal dialysis: Secondary | ICD-10-CM | POA: Diagnosis not present

## 2017-06-28 DIAGNOSIS — D509 Iron deficiency anemia, unspecified: Secondary | ICD-10-CM | POA: Diagnosis not present

## 2017-06-29 DIAGNOSIS — D509 Iron deficiency anemia, unspecified: Secondary | ICD-10-CM | POA: Diagnosis not present

## 2017-06-29 DIAGNOSIS — E119 Type 2 diabetes mellitus without complications: Secondary | ICD-10-CM | POA: Diagnosis not present

## 2017-06-29 DIAGNOSIS — Z992 Dependence on renal dialysis: Secondary | ICD-10-CM | POA: Diagnosis not present

## 2017-06-29 DIAGNOSIS — N186 End stage renal disease: Secondary | ICD-10-CM | POA: Diagnosis not present

## 2017-06-29 DIAGNOSIS — N2581 Secondary hyperparathyroidism of renal origin: Secondary | ICD-10-CM | POA: Diagnosis not present

## 2017-06-29 DIAGNOSIS — D631 Anemia in chronic kidney disease: Secondary | ICD-10-CM | POA: Diagnosis not present

## 2017-06-30 DIAGNOSIS — N186 End stage renal disease: Secondary | ICD-10-CM | POA: Diagnosis not present

## 2017-06-30 DIAGNOSIS — D631 Anemia in chronic kidney disease: Secondary | ICD-10-CM | POA: Diagnosis not present

## 2017-06-30 DIAGNOSIS — N2581 Secondary hyperparathyroidism of renal origin: Secondary | ICD-10-CM | POA: Diagnosis not present

## 2017-06-30 DIAGNOSIS — D509 Iron deficiency anemia, unspecified: Secondary | ICD-10-CM | POA: Diagnosis not present

## 2017-06-30 DIAGNOSIS — Z992 Dependence on renal dialysis: Secondary | ICD-10-CM | POA: Diagnosis not present

## 2017-07-01 DIAGNOSIS — N2581 Secondary hyperparathyroidism of renal origin: Secondary | ICD-10-CM | POA: Diagnosis not present

## 2017-07-01 DIAGNOSIS — D509 Iron deficiency anemia, unspecified: Secondary | ICD-10-CM | POA: Diagnosis not present

## 2017-07-01 DIAGNOSIS — D631 Anemia in chronic kidney disease: Secondary | ICD-10-CM | POA: Diagnosis not present

## 2017-07-01 DIAGNOSIS — N186 End stage renal disease: Secondary | ICD-10-CM | POA: Diagnosis not present

## 2017-07-01 DIAGNOSIS — Z992 Dependence on renal dialysis: Secondary | ICD-10-CM | POA: Diagnosis not present

## 2017-07-02 DIAGNOSIS — D509 Iron deficiency anemia, unspecified: Secondary | ICD-10-CM | POA: Diagnosis not present

## 2017-07-02 DIAGNOSIS — N2581 Secondary hyperparathyroidism of renal origin: Secondary | ICD-10-CM | POA: Diagnosis not present

## 2017-07-02 DIAGNOSIS — N186 End stage renal disease: Secondary | ICD-10-CM | POA: Diagnosis not present

## 2017-07-02 DIAGNOSIS — Z992 Dependence on renal dialysis: Secondary | ICD-10-CM | POA: Diagnosis not present

## 2017-07-02 DIAGNOSIS — D631 Anemia in chronic kidney disease: Secondary | ICD-10-CM | POA: Diagnosis not present

## 2017-07-03 DIAGNOSIS — D631 Anemia in chronic kidney disease: Secondary | ICD-10-CM | POA: Diagnosis not present

## 2017-07-03 DIAGNOSIS — N2581 Secondary hyperparathyroidism of renal origin: Secondary | ICD-10-CM | POA: Diagnosis not present

## 2017-07-03 DIAGNOSIS — D509 Iron deficiency anemia, unspecified: Secondary | ICD-10-CM | POA: Diagnosis not present

## 2017-07-03 DIAGNOSIS — N186 End stage renal disease: Secondary | ICD-10-CM | POA: Diagnosis not present

## 2017-07-03 DIAGNOSIS — Z992 Dependence on renal dialysis: Secondary | ICD-10-CM | POA: Diagnosis not present

## 2017-07-04 DIAGNOSIS — D509 Iron deficiency anemia, unspecified: Secondary | ICD-10-CM | POA: Diagnosis not present

## 2017-07-04 DIAGNOSIS — D631 Anemia in chronic kidney disease: Secondary | ICD-10-CM | POA: Diagnosis not present

## 2017-07-04 DIAGNOSIS — N186 End stage renal disease: Secondary | ICD-10-CM | POA: Diagnosis not present

## 2017-07-04 DIAGNOSIS — N2581 Secondary hyperparathyroidism of renal origin: Secondary | ICD-10-CM | POA: Diagnosis not present

## 2017-07-04 DIAGNOSIS — Z992 Dependence on renal dialysis: Secondary | ICD-10-CM | POA: Diagnosis not present

## 2017-07-05 DIAGNOSIS — N2581 Secondary hyperparathyroidism of renal origin: Secondary | ICD-10-CM | POA: Diagnosis not present

## 2017-07-05 DIAGNOSIS — D509 Iron deficiency anemia, unspecified: Secondary | ICD-10-CM | POA: Diagnosis not present

## 2017-07-05 DIAGNOSIS — Z992 Dependence on renal dialysis: Secondary | ICD-10-CM | POA: Diagnosis not present

## 2017-07-05 DIAGNOSIS — N186 End stage renal disease: Secondary | ICD-10-CM | POA: Diagnosis not present

## 2017-07-05 DIAGNOSIS — D631 Anemia in chronic kidney disease: Secondary | ICD-10-CM | POA: Diagnosis not present

## 2017-07-06 DIAGNOSIS — N186 End stage renal disease: Secondary | ICD-10-CM | POA: Diagnosis not present

## 2017-07-06 DIAGNOSIS — D631 Anemia in chronic kidney disease: Secondary | ICD-10-CM | POA: Diagnosis not present

## 2017-07-06 DIAGNOSIS — N2581 Secondary hyperparathyroidism of renal origin: Secondary | ICD-10-CM | POA: Diagnosis not present

## 2017-07-06 DIAGNOSIS — Z992 Dependence on renal dialysis: Secondary | ICD-10-CM | POA: Diagnosis not present

## 2017-07-06 DIAGNOSIS — D509 Iron deficiency anemia, unspecified: Secondary | ICD-10-CM | POA: Diagnosis not present

## 2017-07-07 DIAGNOSIS — N2581 Secondary hyperparathyroidism of renal origin: Secondary | ICD-10-CM | POA: Diagnosis not present

## 2017-07-07 DIAGNOSIS — D509 Iron deficiency anemia, unspecified: Secondary | ICD-10-CM | POA: Diagnosis not present

## 2017-07-07 DIAGNOSIS — Z992 Dependence on renal dialysis: Secondary | ICD-10-CM | POA: Diagnosis not present

## 2017-07-07 DIAGNOSIS — D631 Anemia in chronic kidney disease: Secondary | ICD-10-CM | POA: Diagnosis not present

## 2017-07-07 DIAGNOSIS — N186 End stage renal disease: Secondary | ICD-10-CM | POA: Diagnosis not present

## 2017-07-08 DIAGNOSIS — N2581 Secondary hyperparathyroidism of renal origin: Secondary | ICD-10-CM | POA: Diagnosis not present

## 2017-07-08 DIAGNOSIS — D631 Anemia in chronic kidney disease: Secondary | ICD-10-CM | POA: Diagnosis not present

## 2017-07-08 DIAGNOSIS — Z992 Dependence on renal dialysis: Secondary | ICD-10-CM | POA: Diagnosis not present

## 2017-07-08 DIAGNOSIS — D509 Iron deficiency anemia, unspecified: Secondary | ICD-10-CM | POA: Diagnosis not present

## 2017-07-08 DIAGNOSIS — N186 End stage renal disease: Secondary | ICD-10-CM | POA: Diagnosis not present

## 2017-07-09 DIAGNOSIS — Z992 Dependence on renal dialysis: Secondary | ICD-10-CM | POA: Diagnosis not present

## 2017-07-09 DIAGNOSIS — D631 Anemia in chronic kidney disease: Secondary | ICD-10-CM | POA: Diagnosis not present

## 2017-07-09 DIAGNOSIS — N186 End stage renal disease: Secondary | ICD-10-CM | POA: Diagnosis not present

## 2017-07-09 DIAGNOSIS — N2581 Secondary hyperparathyroidism of renal origin: Secondary | ICD-10-CM | POA: Diagnosis not present

## 2017-07-09 DIAGNOSIS — D509 Iron deficiency anemia, unspecified: Secondary | ICD-10-CM | POA: Diagnosis not present

## 2017-07-10 DIAGNOSIS — Z992 Dependence on renal dialysis: Secondary | ICD-10-CM | POA: Diagnosis not present

## 2017-07-10 DIAGNOSIS — D631 Anemia in chronic kidney disease: Secondary | ICD-10-CM | POA: Diagnosis not present

## 2017-07-10 DIAGNOSIS — D509 Iron deficiency anemia, unspecified: Secondary | ICD-10-CM | POA: Diagnosis not present

## 2017-07-10 DIAGNOSIS — N186 End stage renal disease: Secondary | ICD-10-CM | POA: Diagnosis not present

## 2017-07-10 DIAGNOSIS — N2581 Secondary hyperparathyroidism of renal origin: Secondary | ICD-10-CM | POA: Diagnosis not present

## 2017-07-11 ENCOUNTER — Other Ambulatory Visit: Payer: Self-pay | Admitting: Nephrology

## 2017-07-11 ENCOUNTER — Ambulatory Visit
Admission: RE | Admit: 2017-07-11 | Discharge: 2017-07-11 | Disposition: A | Discharge status: Home / Self Care | Payer: Medicare Other | Source: Ambulatory Visit | Attending: Nephrology | Admitting: Nephrology

## 2017-07-11 DIAGNOSIS — R918 Other nonspecific abnormal finding of lung field: Secondary | ICD-10-CM | POA: Insufficient documentation

## 2017-07-11 DIAGNOSIS — R05 Cough: Secondary | ICD-10-CM

## 2017-07-11 DIAGNOSIS — D509 Iron deficiency anemia, unspecified: Secondary | ICD-10-CM | POA: Diagnosis not present

## 2017-07-11 DIAGNOSIS — N186 End stage renal disease: Secondary | ICD-10-CM | POA: Diagnosis not present

## 2017-07-11 DIAGNOSIS — R0602 Shortness of breath: Secondary | ICD-10-CM

## 2017-07-11 DIAGNOSIS — R059 Cough, unspecified: Secondary | ICD-10-CM

## 2017-07-11 DIAGNOSIS — J90 Pleural effusion, not elsewhere classified: Secondary | ICD-10-CM | POA: Diagnosis not present

## 2017-07-11 DIAGNOSIS — N2581 Secondary hyperparathyroidism of renal origin: Secondary | ICD-10-CM | POA: Diagnosis not present

## 2017-07-11 DIAGNOSIS — Z992 Dependence on renal dialysis: Secondary | ICD-10-CM | POA: Diagnosis not present

## 2017-07-11 DIAGNOSIS — D631 Anemia in chronic kidney disease: Secondary | ICD-10-CM | POA: Diagnosis not present

## 2017-07-12 DIAGNOSIS — D631 Anemia in chronic kidney disease: Secondary | ICD-10-CM | POA: Diagnosis not present

## 2017-07-12 DIAGNOSIS — Z992 Dependence on renal dialysis: Secondary | ICD-10-CM | POA: Diagnosis not present

## 2017-07-12 DIAGNOSIS — N2581 Secondary hyperparathyroidism of renal origin: Secondary | ICD-10-CM | POA: Diagnosis not present

## 2017-07-12 DIAGNOSIS — D509 Iron deficiency anemia, unspecified: Secondary | ICD-10-CM | POA: Diagnosis not present

## 2017-07-12 DIAGNOSIS — N186 End stage renal disease: Secondary | ICD-10-CM | POA: Diagnosis not present

## 2017-07-13 DIAGNOSIS — N2581 Secondary hyperparathyroidism of renal origin: Secondary | ICD-10-CM | POA: Diagnosis not present

## 2017-07-13 DIAGNOSIS — D631 Anemia in chronic kidney disease: Secondary | ICD-10-CM | POA: Diagnosis not present

## 2017-07-13 DIAGNOSIS — Z992 Dependence on renal dialysis: Secondary | ICD-10-CM | POA: Diagnosis not present

## 2017-07-13 DIAGNOSIS — D509 Iron deficiency anemia, unspecified: Secondary | ICD-10-CM | POA: Diagnosis not present

## 2017-07-13 DIAGNOSIS — N186 End stage renal disease: Secondary | ICD-10-CM | POA: Diagnosis not present

## 2017-07-14 DIAGNOSIS — N186 End stage renal disease: Secondary | ICD-10-CM | POA: Diagnosis not present

## 2017-07-14 DIAGNOSIS — N2581 Secondary hyperparathyroidism of renal origin: Secondary | ICD-10-CM | POA: Diagnosis not present

## 2017-07-14 DIAGNOSIS — D631 Anemia in chronic kidney disease: Secondary | ICD-10-CM | POA: Diagnosis not present

## 2017-07-14 DIAGNOSIS — D509 Iron deficiency anemia, unspecified: Secondary | ICD-10-CM | POA: Diagnosis not present

## 2017-07-14 DIAGNOSIS — Z992 Dependence on renal dialysis: Secondary | ICD-10-CM | POA: Diagnosis not present

## 2017-07-15 DIAGNOSIS — D509 Iron deficiency anemia, unspecified: Secondary | ICD-10-CM | POA: Diagnosis not present

## 2017-07-15 DIAGNOSIS — Z992 Dependence on renal dialysis: Secondary | ICD-10-CM | POA: Diagnosis not present

## 2017-07-15 DIAGNOSIS — D631 Anemia in chronic kidney disease: Secondary | ICD-10-CM | POA: Diagnosis not present

## 2017-07-15 DIAGNOSIS — N186 End stage renal disease: Secondary | ICD-10-CM | POA: Diagnosis not present

## 2017-07-16 DIAGNOSIS — D509 Iron deficiency anemia, unspecified: Secondary | ICD-10-CM | POA: Diagnosis not present

## 2017-07-16 DIAGNOSIS — D631 Anemia in chronic kidney disease: Secondary | ICD-10-CM | POA: Diagnosis not present

## 2017-07-16 DIAGNOSIS — N186 End stage renal disease: Secondary | ICD-10-CM | POA: Diagnosis not present

## 2017-07-16 DIAGNOSIS — Z992 Dependence on renal dialysis: Secondary | ICD-10-CM | POA: Diagnosis not present

## 2017-07-17 DIAGNOSIS — N186 End stage renal disease: Secondary | ICD-10-CM | POA: Diagnosis not present

## 2017-07-17 DIAGNOSIS — D631 Anemia in chronic kidney disease: Secondary | ICD-10-CM | POA: Diagnosis not present

## 2017-07-17 DIAGNOSIS — Z992 Dependence on renal dialysis: Secondary | ICD-10-CM | POA: Diagnosis not present

## 2017-07-17 DIAGNOSIS — D509 Iron deficiency anemia, unspecified: Secondary | ICD-10-CM | POA: Diagnosis not present

## 2017-07-18 DIAGNOSIS — N186 End stage renal disease: Secondary | ICD-10-CM | POA: Diagnosis not present

## 2017-07-18 DIAGNOSIS — D509 Iron deficiency anemia, unspecified: Secondary | ICD-10-CM | POA: Diagnosis not present

## 2017-07-18 DIAGNOSIS — Z992 Dependence on renal dialysis: Secondary | ICD-10-CM | POA: Diagnosis not present

## 2017-07-18 DIAGNOSIS — D631 Anemia in chronic kidney disease: Secondary | ICD-10-CM | POA: Diagnosis not present

## 2017-07-19 DIAGNOSIS — Z992 Dependence on renal dialysis: Secondary | ICD-10-CM | POA: Diagnosis not present

## 2017-07-19 DIAGNOSIS — D509 Iron deficiency anemia, unspecified: Secondary | ICD-10-CM | POA: Diagnosis not present

## 2017-07-19 DIAGNOSIS — N186 End stage renal disease: Secondary | ICD-10-CM | POA: Diagnosis not present

## 2017-07-19 DIAGNOSIS — D631 Anemia in chronic kidney disease: Secondary | ICD-10-CM | POA: Diagnosis not present

## 2017-07-20 DIAGNOSIS — D509 Iron deficiency anemia, unspecified: Secondary | ICD-10-CM | POA: Diagnosis not present

## 2017-07-20 DIAGNOSIS — N186 End stage renal disease: Secondary | ICD-10-CM | POA: Diagnosis not present

## 2017-07-20 DIAGNOSIS — Z992 Dependence on renal dialysis: Secondary | ICD-10-CM | POA: Diagnosis not present

## 2017-07-20 DIAGNOSIS — D631 Anemia in chronic kidney disease: Secondary | ICD-10-CM | POA: Diagnosis not present

## 2017-07-21 DIAGNOSIS — Z992 Dependence on renal dialysis: Secondary | ICD-10-CM | POA: Diagnosis not present

## 2017-07-21 DIAGNOSIS — D631 Anemia in chronic kidney disease: Secondary | ICD-10-CM | POA: Diagnosis not present

## 2017-07-21 DIAGNOSIS — D509 Iron deficiency anemia, unspecified: Secondary | ICD-10-CM | POA: Diagnosis not present

## 2017-07-21 DIAGNOSIS — N186 End stage renal disease: Secondary | ICD-10-CM | POA: Diagnosis not present

## 2017-07-22 DIAGNOSIS — D631 Anemia in chronic kidney disease: Secondary | ICD-10-CM | POA: Diagnosis not present

## 2017-07-22 DIAGNOSIS — N186 End stage renal disease: Secondary | ICD-10-CM | POA: Diagnosis not present

## 2017-07-22 DIAGNOSIS — D509 Iron deficiency anemia, unspecified: Secondary | ICD-10-CM | POA: Diagnosis not present

## 2017-07-22 DIAGNOSIS — Z992 Dependence on renal dialysis: Secondary | ICD-10-CM | POA: Diagnosis not present

## 2017-07-23 DIAGNOSIS — N186 End stage renal disease: Secondary | ICD-10-CM | POA: Diagnosis not present

## 2017-07-23 DIAGNOSIS — D509 Iron deficiency anemia, unspecified: Secondary | ICD-10-CM | POA: Diagnosis not present

## 2017-07-23 DIAGNOSIS — Z992 Dependence on renal dialysis: Secondary | ICD-10-CM | POA: Diagnosis not present

## 2017-07-23 DIAGNOSIS — D631 Anemia in chronic kidney disease: Secondary | ICD-10-CM | POA: Diagnosis not present

## 2017-07-24 DIAGNOSIS — N186 End stage renal disease: Secondary | ICD-10-CM | POA: Diagnosis not present

## 2017-07-24 DIAGNOSIS — D509 Iron deficiency anemia, unspecified: Secondary | ICD-10-CM | POA: Diagnosis not present

## 2017-07-24 DIAGNOSIS — D631 Anemia in chronic kidney disease: Secondary | ICD-10-CM | POA: Diagnosis not present

## 2017-07-24 DIAGNOSIS — Z992 Dependence on renal dialysis: Secondary | ICD-10-CM | POA: Diagnosis not present

## 2017-07-25 DIAGNOSIS — N186 End stage renal disease: Secondary | ICD-10-CM | POA: Diagnosis not present

## 2017-07-25 DIAGNOSIS — D509 Iron deficiency anemia, unspecified: Secondary | ICD-10-CM | POA: Diagnosis not present

## 2017-07-25 DIAGNOSIS — D631 Anemia in chronic kidney disease: Secondary | ICD-10-CM | POA: Diagnosis not present

## 2017-07-25 DIAGNOSIS — Z992 Dependence on renal dialysis: Secondary | ICD-10-CM | POA: Diagnosis not present

## 2017-07-26 DIAGNOSIS — N186 End stage renal disease: Secondary | ICD-10-CM | POA: Diagnosis not present

## 2017-07-26 DIAGNOSIS — Z992 Dependence on renal dialysis: Secondary | ICD-10-CM | POA: Diagnosis not present

## 2017-07-26 DIAGNOSIS — D631 Anemia in chronic kidney disease: Secondary | ICD-10-CM | POA: Diagnosis not present

## 2017-07-26 DIAGNOSIS — D509 Iron deficiency anemia, unspecified: Secondary | ICD-10-CM | POA: Diagnosis not present

## 2017-07-27 DIAGNOSIS — N186 End stage renal disease: Secondary | ICD-10-CM | POA: Diagnosis not present

## 2017-07-27 DIAGNOSIS — Z992 Dependence on renal dialysis: Secondary | ICD-10-CM | POA: Diagnosis not present

## 2017-07-27 DIAGNOSIS — D509 Iron deficiency anemia, unspecified: Secondary | ICD-10-CM | POA: Diagnosis not present

## 2017-07-27 DIAGNOSIS — D631 Anemia in chronic kidney disease: Secondary | ICD-10-CM | POA: Diagnosis not present

## 2017-07-28 DIAGNOSIS — Z992 Dependence on renal dialysis: Secondary | ICD-10-CM | POA: Diagnosis not present

## 2017-07-28 DIAGNOSIS — N186 End stage renal disease: Secondary | ICD-10-CM | POA: Diagnosis not present

## 2017-07-28 DIAGNOSIS — D631 Anemia in chronic kidney disease: Secondary | ICD-10-CM | POA: Diagnosis not present

## 2017-07-28 DIAGNOSIS — D509 Iron deficiency anemia, unspecified: Secondary | ICD-10-CM | POA: Diagnosis not present

## 2017-07-29 DIAGNOSIS — Z992 Dependence on renal dialysis: Secondary | ICD-10-CM | POA: Diagnosis not present

## 2017-07-29 DIAGNOSIS — D631 Anemia in chronic kidney disease: Secondary | ICD-10-CM | POA: Diagnosis not present

## 2017-07-29 DIAGNOSIS — D509 Iron deficiency anemia, unspecified: Secondary | ICD-10-CM | POA: Diagnosis not present

## 2017-07-29 DIAGNOSIS — N186 End stage renal disease: Secondary | ICD-10-CM | POA: Diagnosis not present

## 2017-07-30 DIAGNOSIS — D509 Iron deficiency anemia, unspecified: Secondary | ICD-10-CM | POA: Diagnosis not present

## 2017-07-30 DIAGNOSIS — D631 Anemia in chronic kidney disease: Secondary | ICD-10-CM | POA: Diagnosis not present

## 2017-07-30 DIAGNOSIS — N186 End stage renal disease: Secondary | ICD-10-CM | POA: Diagnosis not present

## 2017-07-30 DIAGNOSIS — Z992 Dependence on renal dialysis: Secondary | ICD-10-CM | POA: Diagnosis not present

## 2017-07-31 DIAGNOSIS — Z992 Dependence on renal dialysis: Secondary | ICD-10-CM | POA: Diagnosis not present

## 2017-07-31 DIAGNOSIS — N186 End stage renal disease: Secondary | ICD-10-CM | POA: Diagnosis not present

## 2017-07-31 DIAGNOSIS — D631 Anemia in chronic kidney disease: Secondary | ICD-10-CM | POA: Diagnosis not present

## 2017-07-31 DIAGNOSIS — D509 Iron deficiency anemia, unspecified: Secondary | ICD-10-CM | POA: Diagnosis not present

## 2017-08-01 DIAGNOSIS — D509 Iron deficiency anemia, unspecified: Secondary | ICD-10-CM | POA: Diagnosis not present

## 2017-08-01 DIAGNOSIS — D631 Anemia in chronic kidney disease: Secondary | ICD-10-CM | POA: Diagnosis not present

## 2017-08-01 DIAGNOSIS — Z992 Dependence on renal dialysis: Secondary | ICD-10-CM | POA: Diagnosis not present

## 2017-08-01 DIAGNOSIS — N186 End stage renal disease: Secondary | ICD-10-CM | POA: Diagnosis not present

## 2017-08-02 DIAGNOSIS — D631 Anemia in chronic kidney disease: Secondary | ICD-10-CM | POA: Diagnosis not present

## 2017-08-02 DIAGNOSIS — D509 Iron deficiency anemia, unspecified: Secondary | ICD-10-CM | POA: Diagnosis not present

## 2017-08-02 DIAGNOSIS — N186 End stage renal disease: Secondary | ICD-10-CM | POA: Diagnosis not present

## 2017-08-02 DIAGNOSIS — Z992 Dependence on renal dialysis: Secondary | ICD-10-CM | POA: Diagnosis not present

## 2017-08-03 DIAGNOSIS — D509 Iron deficiency anemia, unspecified: Secondary | ICD-10-CM | POA: Diagnosis not present

## 2017-08-03 DIAGNOSIS — D631 Anemia in chronic kidney disease: Secondary | ICD-10-CM | POA: Diagnosis not present

## 2017-08-03 DIAGNOSIS — N186 End stage renal disease: Secondary | ICD-10-CM | POA: Diagnosis not present

## 2017-08-03 DIAGNOSIS — Z992 Dependence on renal dialysis: Secondary | ICD-10-CM | POA: Diagnosis not present

## 2017-08-04 DIAGNOSIS — N186 End stage renal disease: Secondary | ICD-10-CM | POA: Diagnosis not present

## 2017-08-04 DIAGNOSIS — Z992 Dependence on renal dialysis: Secondary | ICD-10-CM | POA: Diagnosis not present

## 2017-08-04 DIAGNOSIS — D509 Iron deficiency anemia, unspecified: Secondary | ICD-10-CM | POA: Diagnosis not present

## 2017-08-04 DIAGNOSIS — D631 Anemia in chronic kidney disease: Secondary | ICD-10-CM | POA: Diagnosis not present

## 2017-08-05 ENCOUNTER — Ambulatory Visit
Admission: RE | Admit: 2017-08-05 | Discharge: 2017-08-05 | Disposition: A | Discharge status: Home / Self Care | Payer: Medicare Other | Source: Ambulatory Visit | Attending: Family Medicine | Admitting: Family Medicine

## 2017-08-05 ENCOUNTER — Other Ambulatory Visit
Admission: RE | Admit: 2017-08-05 | Discharge: 2017-08-05 | Disposition: A | Discharge status: Home / Self Care | Payer: Medicare Other | Source: Ambulatory Visit | Attending: Family Medicine | Admitting: Family Medicine

## 2017-08-05 ENCOUNTER — Encounter: Payer: Self-pay | Admitting: Family Medicine

## 2017-08-05 ENCOUNTER — Ambulatory Visit (INDEPENDENT_AMBULATORY_CARE_PROVIDER_SITE_OTHER): Payer: Medicare Other | Admitting: Family Medicine

## 2017-08-05 VITALS — BP 154/100 | HR 80 | Temp 98.2°F | Ht 69.0 in | Wt 291.0 lb

## 2017-08-05 DIAGNOSIS — J90 Pleural effusion, not elsewhere classified: Secondary | ICD-10-CM

## 2017-08-05 DIAGNOSIS — E1122 Type 2 diabetes mellitus with diabetic chronic kidney disease: Secondary | ICD-10-CM | POA: Diagnosis not present

## 2017-08-05 DIAGNOSIS — Z992 Dependence on renal dialysis: Secondary | ICD-10-CM | POA: Diagnosis not present

## 2017-08-05 DIAGNOSIS — R0602 Shortness of breath: Secondary | ICD-10-CM

## 2017-08-05 DIAGNOSIS — I10 Essential (primary) hypertension: Secondary | ICD-10-CM | POA: Diagnosis not present

## 2017-08-05 DIAGNOSIS — E875 Hyperkalemia: Secondary | ICD-10-CM | POA: Diagnosis not present

## 2017-08-05 DIAGNOSIS — J9811 Atelectasis: Secondary | ICD-10-CM | POA: Diagnosis not present

## 2017-08-05 DIAGNOSIS — N186 End stage renal disease: Secondary | ICD-10-CM | POA: Diagnosis not present

## 2017-08-05 DIAGNOSIS — R918 Other nonspecific abnormal finding of lung field: Secondary | ICD-10-CM | POA: Diagnosis not present

## 2017-08-05 DIAGNOSIS — D631 Anemia in chronic kidney disease: Secondary | ICD-10-CM

## 2017-08-05 DIAGNOSIS — N185 Chronic kidney disease, stage 5: Secondary | ICD-10-CM

## 2017-08-05 DIAGNOSIS — I425 Other restrictive cardiomyopathy: Secondary | ICD-10-CM

## 2017-08-05 DIAGNOSIS — I248 Other forms of acute ischemic heart disease: Secondary | ICD-10-CM | POA: Diagnosis not present

## 2017-08-05 DIAGNOSIS — I120 Hypertensive chronic kidney disease with stage 5 chronic kidney disease or end stage renal disease: Secondary | ICD-10-CM | POA: Diagnosis not present

## 2017-08-05 DIAGNOSIS — Z1211 Encounter for screening for malignant neoplasm of colon: Secondary | ICD-10-CM

## 2017-08-05 DIAGNOSIS — R0609 Other forms of dyspnea: Secondary | ICD-10-CM | POA: Diagnosis not present

## 2017-08-05 DIAGNOSIS — R9389 Abnormal findings on diagnostic imaging of other specified body structures: Secondary | ICD-10-CM

## 2017-08-05 DIAGNOSIS — Z6841 Body Mass Index (BMI) 40.0 and over, adult: Secondary | ICD-10-CM | POA: Diagnosis not present

## 2017-08-05 DIAGNOSIS — I319 Disease of pericardium, unspecified: Secondary | ICD-10-CM | POA: Diagnosis not present

## 2017-08-05 DIAGNOSIS — R0902 Hypoxemia: Secondary | ICD-10-CM | POA: Diagnosis not present

## 2017-08-05 DIAGNOSIS — R05 Cough: Secondary | ICD-10-CM | POA: Diagnosis not present

## 2017-08-05 LAB — CBC WITH DIFFERENTIAL/PLATELET
BASOS PCT: 1 %
Basophils Absolute: 0 10*3/uL (ref 0–0.1)
EOS PCT: 4 %
Eosinophils Absolute: 0.2 10*3/uL (ref 0–0.7)
HEMATOCRIT: 23.2 % — AB (ref 40.0–52.0)
Hemoglobin: 7.9 g/dL — ABNORMAL LOW (ref 13.0–18.0)
Lymphocytes Relative: 29 %
Lymphs Abs: 1.5 10*3/uL (ref 1.0–3.6)
MCH: 32.9 pg (ref 26.0–34.0)
MCHC: 34 g/dL (ref 32.0–36.0)
MCV: 96.7 fL (ref 80.0–100.0)
MONO ABS: 0.6 10*3/uL (ref 0.2–1.0)
MONOS PCT: 11 %
Neutro Abs: 2.9 10*3/uL (ref 1.4–6.5)
Neutrophils Relative %: 55 %
PLATELETS: 212 10*3/uL (ref 150–440)
RBC: 2.4 MIL/uL — ABNORMAL LOW (ref 4.40–5.90)
RDW: 18.1 % — AB (ref 11.5–14.5)
WBC: 5.3 10*3/uL (ref 3.8–10.6)

## 2017-08-05 LAB — RENAL FUNCTION PANEL
ALBUMIN: 3.6 g/dL (ref 3.5–5.0)
Anion gap: 17 — ABNORMAL HIGH (ref 5–15)
BUN: 90 mg/dL — AB (ref 6–20)
CO2: 26 mmol/L (ref 22–32)
CREATININE: 16.93 mg/dL — AB (ref 0.61–1.24)
Calcium: 8.5 mg/dL — ABNORMAL LOW (ref 8.9–10.3)
Chloride: 94 mmol/L — ABNORMAL LOW (ref 101–111)
GFR calc Af Amer: 3 mL/min — ABNORMAL LOW (ref >60)
GFR calc non Af Amer: 3 mL/min — ABNORMAL LOW (ref >60)
Glucose, Bld: 163 mg/dL — ABNORMAL HIGH (ref 65–99)
PHOSPHORUS: 11.9 mg/dL — AB (ref 2.5–4.6)
POTASSIUM: 5.9 mmol/L — AB (ref 3.5–5.1)
Sodium: 137 mmol/L (ref 135–145)

## 2017-08-05 LAB — HEMOCCULT GUIAC POC 1CARD (OFFICE)

## 2017-08-05 NOTE — Progress Notes (Signed)
Name: Jake Gomez   MRN: [id number]    DOB: Feb 10, 1971   Date:08/05/2017       Progress Note  Subjective  Chief Complaint  Chief Complaint  Patient presents with  . Pneumonia    was Dx with pneumonia on 06/10/18- has been on 2 rounds of Doxy- finished last Rx  x 4 days ago- was supposed to have repeat xray this month d/t a worsening xray in January- still has dry cough and cong    Patient presents to reestablish care.   Shortness of Breath  This is a new problem. The current episode started more than 1 month ago (dec 2018). The problem occurs daily. The problem has been gradually worsening. Associated symptoms include leg swelling, orthopnea, PND and sputum production. Pertinent negatives include no abdominal pain, chest pain, ear pain, fever, headaches, hemoptysis, neck pain, rash, sore throat or wheezing. Treatments tried: bumex/labetolol/avapro/ amlodipine. The treatment provided moderate relief. His past medical history is significant for a heart failure.    No problem-specific Assessment & Plan notes found for this encounter.   Past Medical History:  Diagnosis Date  . Diabetes mellitus without complication ([place])   . Hypertension   . Renal disorder     Past Surgical History:  Procedure Laterality Date  . peritonal dialysis      No family history on file.  Social History   Socioeconomic History  . Marital status: Single    Spouse name: Not on file  . Number of children: Not on file  . Years of education: Not on file  . Highest education level: Not on file  Social Needs  . Financial resource strain: Not on file  . Food insecurity - worry: Not on file  . Food insecurity - inability: Not on file  . Transportation needs - medical: Not on file  . Transportation needs - non-medical: Not on file  Occupational History  . Not on file  Tobacco Use  . Smoking status: Former Research scientist (life sciences)  . Smokeless tobacco: Former Systems developer    Quit date: 07/16/2009  Substance and Sexual Activity   . Alcohol use: No    Frequency: Never  . Drug use: No  . Sexual activity: Not on file  Other Topics Concern  . Not on file  Social History Narrative  . Not on file    No Known Allergies  Outpatient Medications Prior to Visit  Medication Sig Dispense Refill  . acetaminophen (TYLENOL) 500 MG tablet Take 500 mg by mouth.    Marland Kitchen amLODipine (NORVASC) 5 MG tablet Take 10 mg by mouth 2 (two) times daily.     . bumetanide (BUMEX) 2 MG tablet Take 2 mg by mouth daily.     . calcium acetate (PHOSLO) 667 MG capsule Take 2,001 mg by mouth 3 (three) times daily with meals.     Marland Kitchen doxycycline (VIBRA-TABS) 100 MG tablet Take 100 mg by mouth daily.    . irbesartan (AVAPRO) 300 MG tablet Take 300 mg by mouth at bedtime.     Marland Kitchen labetalol (NORMODYNE) 200 MG tablet Take 200 mg by mouth 2 (two) times daily.     . Vitamin D, Ergocalciferol, (DRISDOL) 50000 units CAPS capsule Take 50,000 Units by mouth every 7 (seven) days.     Marland Kitchen labetalol (NORMODYNE) 100 MG tablet Take by mouth.     No facility-administered medications prior to visit.     Review of Systems  Constitutional: Negative for chills, fever, malaise/fatigue and weight loss.  HENT: Negative  for ear discharge, ear pain and sore throat.   Eyes: Negative for blurred vision.  Respiratory: Positive for sputum production and shortness of breath. Negative for cough, hemoptysis and wheezing.   Cardiovascular: Positive for orthopnea, leg swelling and PND. Negative for chest pain and palpitations.  Gastrointestinal: Negative for abdominal pain, blood in stool, constipation, diarrhea, heartburn, melena and nausea.  Genitourinary: Negative for dysuria, frequency, hematuria and urgency.  Musculoskeletal: Negative for back pain, joint pain, myalgias and neck pain.  Skin: Negative for rash.  Neurological: Negative for dizziness, tingling, sensory change, focal weakness and headaches.  Endo/Heme/Allergies: Negative for environmental allergies and polydipsia.  Does not bruise/bleed easily.  Psychiatric/Behavioral: Negative for depression and suicidal ideas. The patient is not nervous/anxious and does not have insomnia.      Objective  Vitals:   08/05/17 1351  BP: (!) 154/100  Pulse: 80  Temp: 98.2 F (36.8 C)  TempSrc: Oral  SpO2: 97%  Weight: 291 lb (132 kg)  Height: 5\' 9"  (1.753 m)    Physical Exam  Constitutional: He is oriented to person, place, and time and well-developed, well-nourished, and in no distress.  HENT:  Head: Normocephalic.  Right Ear: External ear normal.  Left Ear: External ear normal.  Nose: Nose normal.  Mouth/Throat: Oropharynx is clear and moist.  Eyes: Conjunctivae and EOM are normal. Pupils are equal, round, and reactive to light. Right eye exhibits no discharge. Left eye exhibits no discharge. No scleral icterus.  Neck: Normal range of motion. Neck supple. No JVD present. No tracheal deviation present. No thyromegaly present.  Cardiovascular: Normal rate, regular rhythm, normal heart sounds and intact distal pulses. Exam reveals no gallop and no friction rub.  No murmur heard. Pulmonary/Chest: No respiratory distress. He has decreased breath sounds. He has no wheezes. He has no rhonchi. He has no rales.  Abdominal: Soft. Bowel sounds are normal. He exhibits no mass. There is no hepatosplenomegaly. There is no tenderness. There is no rebound, no guarding and no CVA tenderness.  Musculoskeletal: Normal range of motion. He exhibits no edema or tenderness.  Lymphadenopathy:    He has no cervical adenopathy.  Neurological: He is alert and oriented to person, place, and time. He has normal sensation, normal strength, normal reflexes and intact cranial nerves. No cranial nerve deficit.  Skin: Skin is warm. No rash noted.  Psychiatric: Mood and affect normal.  Nursing note and vitals reviewed.     Assessment & Plan  Problem List Items Addressed This Visit      Cardiovascular and Mediastinum   Hypertension    Relevant Orders   Renal Function Panel     Endocrine   Diabetes mellitus ([place])   Relevant Orders   Hemoglobin A1c   Renal Function Panel     Genitourinary   ESRD on peritoneal dialysis [facility])   Relevant Orders   Renal Function Panel   Hyperkalemia, diminished renal excretion   Relevant Orders   Renal Function Panel    Other Visit Diagnoses    Shortness of breath    -  Primary   Relevant Orders   Ambulatory referral to Pulmonology   Ambulatory referral to Cardiology   Pericarditis, unspecified chronicity, unspecified type       ?uremic/?constrictive   Relevant Orders   Ambulatory referral to Cardiology   Abnormal chest CT       Relevant Orders   DG Chest 2 View (Completed)   Ambulatory referral to Pulmonology   Ambulatory referral to Cardiology  Pleural effusion       Relevant Orders   Ambulatory referral to Pulmonology   Anemia due to stage 5 chronic kidney disease, not on chronic dialysis ([place])       Relevant Orders   CBC with Differential/Platelet   Pulmonary nodules/lesions, multiple       Relevant Orders   Ambulatory referral to Pulmonology   Other restrictive cardiomyopathy [facility])       Relevant Orders   Ambulatory referral to Cardiology   Colon cancer screening       Relevant Orders   POCT Occult Blood Stool (Completed)      No orders of the defined types were placed in this encounter. No stool results for this pt Send to ER for further eval of SOB, increasing pleural effusion, [facility] pulmonolgy unable to contact pt as well as we were unable to contact Dr Margarette Asal today to sched a follow up appt. Pt was supposed to be seen by cardiology Dr Willow Ora? This month, but "no showed" due to "unable to leave a message on pt's phone per cardiology dept [facility]. Repeated chest x ray today with concerns of pt not getting any better since December. I spent 70 minutes with this patient, More than 50% of that time was spent in face to face education, counseling and care  coordination.   Dr. Macon [facility]  08/05/17

## 2017-08-06 DIAGNOSIS — N186 End stage renal disease: Secondary | ICD-10-CM | POA: Diagnosis not present

## 2017-08-06 DIAGNOSIS — Z992 Dependence on renal dialysis: Secondary | ICD-10-CM | POA: Diagnosis not present

## 2017-08-06 DIAGNOSIS — N250 Renal osteodystrophy: Secondary | ICD-10-CM | POA: Diagnosis not present

## 2017-08-06 DIAGNOSIS — I214 Non-ST elevation (NSTEMI) myocardial infarction: Secondary | ICD-10-CM | POA: Diagnosis not present

## 2017-08-06 DIAGNOSIS — D631 Anemia in chronic kidney disease: Secondary | ICD-10-CM | POA: Diagnosis not present

## 2017-08-06 DIAGNOSIS — J90 Pleural effusion, not elsewhere classified: Secondary | ICD-10-CM | POA: Diagnosis not present

## 2017-08-06 DIAGNOSIS — R0600 Dyspnea, unspecified: Secondary | ICD-10-CM | POA: Diagnosis not present

## 2017-08-06 DIAGNOSIS — I120 Hypertensive chronic kidney disease with stage 5 chronic kidney disease or end stage renal disease: Secondary | ICD-10-CM | POA: Diagnosis not present

## 2017-08-06 DIAGNOSIS — E119 Type 2 diabetes mellitus without complications: Secondary | ICD-10-CM | POA: Diagnosis not present

## 2017-08-06 DIAGNOSIS — R0902 Hypoxemia: Secondary | ICD-10-CM | POA: Diagnosis not present

## 2017-08-06 DIAGNOSIS — R0602 Shortness of breath: Secondary | ICD-10-CM | POA: Diagnosis not present

## 2017-08-06 DIAGNOSIS — D649 Anemia, unspecified: Secondary | ICD-10-CM | POA: Diagnosis not present

## 2017-08-06 DIAGNOSIS — J9811 Atelectasis: Secondary | ICD-10-CM | POA: Diagnosis not present

## 2017-08-07 DIAGNOSIS — Z48813 Encounter for surgical aftercare following surgery on the respiratory system: Secondary | ICD-10-CM | POA: Diagnosis not present

## 2017-08-07 DIAGNOSIS — J90 Pleural effusion, not elsewhere classified: Secondary | ICD-10-CM | POA: Diagnosis not present

## 2017-08-07 DIAGNOSIS — R0602 Shortness of breath: Secondary | ICD-10-CM | POA: Diagnosis not present

## 2017-08-07 DIAGNOSIS — J942 Hemothorax: Secondary | ICD-10-CM | POA: Diagnosis not present

## 2017-08-07 DIAGNOSIS — E1122 Type 2 diabetes mellitus with diabetic chronic kidney disease: Secondary | ICD-10-CM | POA: Diagnosis present

## 2017-08-07 DIAGNOSIS — I248 Other forms of acute ischemic heart disease: Secondary | ICD-10-CM | POA: Diagnosis not present

## 2017-08-07 DIAGNOSIS — J9811 Atelectasis: Secondary | ICD-10-CM | POA: Diagnosis present

## 2017-08-07 DIAGNOSIS — Z87891 Personal history of nicotine dependence: Secondary | ICD-10-CM | POA: Diagnosis not present

## 2017-08-07 DIAGNOSIS — E875 Hyperkalemia: Secondary | ICD-10-CM | POA: Diagnosis not present

## 2017-08-07 DIAGNOSIS — I10 Essential (primary) hypertension: Secondary | ICD-10-CM | POA: Diagnosis not present

## 2017-08-07 DIAGNOSIS — I214 Non-ST elevation (NSTEMI) myocardial infarction: Secondary | ICD-10-CM | POA: Diagnosis not present

## 2017-08-07 DIAGNOSIS — I120 Hypertensive chronic kidney disease with stage 5 chronic kidney disease or end stage renal disease: Secondary | ICD-10-CM | POA: Diagnosis present

## 2017-08-07 DIAGNOSIS — R918 Other nonspecific abnormal finding of lung field: Secondary | ICD-10-CM | POA: Diagnosis not present

## 2017-08-07 DIAGNOSIS — N186 End stage renal disease: Secondary | ICD-10-CM | POA: Diagnosis not present

## 2017-08-07 DIAGNOSIS — Z79899 Other long term (current) drug therapy: Secondary | ICD-10-CM | POA: Diagnosis not present

## 2017-08-07 DIAGNOSIS — E119 Type 2 diabetes mellitus without complications: Secondary | ICD-10-CM | POA: Diagnosis not present

## 2017-08-07 DIAGNOSIS — R0902 Hypoxemia: Secondary | ICD-10-CM | POA: Diagnosis present

## 2017-08-07 DIAGNOSIS — J918 Pleural effusion in other conditions classified elsewhere: Secondary | ICD-10-CM | POA: Diagnosis not present

## 2017-08-07 DIAGNOSIS — R0600 Dyspnea, unspecified: Secondary | ICD-10-CM | POA: Diagnosis not present

## 2017-08-07 DIAGNOSIS — I2510 Atherosclerotic heart disease of native coronary artery without angina pectoris: Secondary | ICD-10-CM | POA: Diagnosis present

## 2017-08-07 DIAGNOSIS — R9431 Abnormal electrocardiogram [ECG] [EKG]: Secondary | ICD-10-CM | POA: Diagnosis not present

## 2017-08-07 DIAGNOSIS — D509 Iron deficiency anemia, unspecified: Secondary | ICD-10-CM | POA: Diagnosis not present

## 2017-08-07 DIAGNOSIS — R05 Cough: Secondary | ICD-10-CM | POA: Diagnosis not present

## 2017-08-07 DIAGNOSIS — D631 Anemia in chronic kidney disease: Secondary | ICD-10-CM | POA: Diagnosis not present

## 2017-08-07 DIAGNOSIS — Z992 Dependence on renal dialysis: Secondary | ICD-10-CM | POA: Diagnosis not present

## 2017-08-07 DIAGNOSIS — E669 Obesity, unspecified: Secondary | ICD-10-CM | POA: Diagnosis present

## 2017-08-07 DIAGNOSIS — Z6841 Body Mass Index (BMI) 40.0 and over, adult: Secondary | ICD-10-CM | POA: Diagnosis not present

## 2017-08-07 DIAGNOSIS — E8770 Fluid overload, unspecified: Secondary | ICD-10-CM | POA: Diagnosis present

## 2017-08-07 LAB — HEMOGLOBIN A1C
HEMOGLOBIN A1C: 7.3 % — AB (ref 4.8–5.6)
MEAN PLASMA GLUCOSE: 163 mg/dL

## 2017-08-08 MED ORDER — ALBUTEROL SULFATE (2.5 MG/3ML) 0.083% IN NEBU
2.50 mg | INHALATION_SOLUTION | RESPIRATORY_TRACT | Status: DC
Start: ? — End: 2017-08-08

## 2017-08-08 MED ORDER — LABETALOL HCL 200 MG PO TABS
200.00 mg | ORAL_TABLET | ORAL | Status: DC
Start: 2017-08-11 — End: 2017-08-08

## 2017-08-08 MED ORDER — GUAIFENESIN 100 MG/5ML PO SYRP
200.00 mg | ORAL_SOLUTION | ORAL | Status: DC
Start: ? — End: 2017-08-08

## 2017-08-08 MED ORDER — INSULIN LISPRO 100 UNIT/ML ~~LOC~~ SOLN
1.00 | SUBCUTANEOUS | Status: DC
Start: 2017-08-11 — End: 2017-08-08

## 2017-08-08 MED ORDER — DEXTROSE 50 % IV SOLN
12.50 | INTRAVENOUS | Status: DC
Start: ? — End: 2017-08-08

## 2017-08-08 MED ORDER — GENERIC EXTERNAL MEDICATION
Status: DC
Start: ? — End: 2017-08-08

## 2017-08-08 MED ORDER — GENERIC EXTERNAL MEDICATION
12000.00 | Status: DC
Start: 2017-08-12 — End: 2017-08-08

## 2017-08-08 MED ORDER — MELATONIN 3 MG PO TABS
3.00 mg | ORAL_TABLET | ORAL | Status: DC
Start: ? — End: 2017-08-08

## 2017-08-08 MED ORDER — BUMETANIDE 1 MG PO TABS
2.00 mg | ORAL_TABLET | ORAL | Status: DC
Start: 2017-08-12 — End: 2017-08-08

## 2017-08-08 MED ORDER — AMLODIPINE BESYLATE 10 MG PO TABS
10.00 mg | ORAL_TABLET | ORAL | Status: DC
Start: 2017-08-12 — End: 2017-08-08

## 2017-08-08 MED ORDER — CALCIUM ACETATE (PHOS BINDER) 667 MG PO CAPS
2668.00 mg | ORAL_CAPSULE | ORAL | Status: DC
Start: 2017-08-11 — End: 2017-08-08

## 2017-08-08 MED ORDER — ATORVASTATIN CALCIUM 80 MG PO TABS
80.00 mg | ORAL_TABLET | ORAL | Status: DC
Start: 2017-08-11 — End: 2017-08-08

## 2017-08-08 MED ORDER — SODIUM CHLORIDE 3 % IN NEBU
4.00 | INHALATION_SOLUTION | RESPIRATORY_TRACT | Status: DC
Start: ? — End: 2017-08-08

## 2017-08-08 MED ORDER — HEPARIN SODIUM (PORCINE) 5000 UNIT/ML IJ SOLN
5000.00 | INTRAMUSCULAR | Status: DC
Start: 2017-08-08 — End: 2017-08-08

## 2017-08-08 MED ORDER — ACETAMINOPHEN 325 MG PO TABS
650.00 mg | ORAL_TABLET | ORAL | Status: DC
Start: ? — End: 2017-08-08

## 2017-08-08 MED ORDER — VITAMIN D (ERGOCALCIFEROL) 1.25 MG (50000 UNIT) PO CAPS
50000.00 | ORAL_CAPSULE | ORAL | Status: DC
Start: 2017-08-13 — End: 2017-08-08

## 2017-08-08 MED ORDER — HYDRALAZINE HCL 20 MG/ML IJ SOLN
10.00 mg | INTRAMUSCULAR | Status: DC
Start: ? — End: 2017-08-08

## 2017-08-11 DIAGNOSIS — Z992 Dependence on renal dialysis: Secondary | ICD-10-CM | POA: Diagnosis not present

## 2017-08-11 DIAGNOSIS — N186 End stage renal disease: Secondary | ICD-10-CM | POA: Diagnosis not present

## 2017-08-11 DIAGNOSIS — D509 Iron deficiency anemia, unspecified: Secondary | ICD-10-CM | POA: Diagnosis not present

## 2017-08-11 DIAGNOSIS — D631 Anemia in chronic kidney disease: Secondary | ICD-10-CM | POA: Diagnosis not present

## 2017-08-11 MED ORDER — ONDANSETRON HCL 8 MG PO TABS
8.00 mg | ORAL_TABLET | ORAL | Status: DC
Start: 2017-08-11 — End: 2017-08-11

## 2017-08-11 MED ORDER — OXYCODONE HCL 5 MG PO TABS
5.00 mg | ORAL_TABLET | ORAL | Status: DC
Start: ? — End: 2017-08-11

## 2017-08-11 MED ORDER — HEPARIN SODIUM (PORCINE) 10000 UNIT/ML IJ SOLN
7500.00 | INTRAMUSCULAR | Status: DC
Start: 2017-08-11 — End: 2017-08-11

## 2017-08-12 DIAGNOSIS — Z992 Dependence on renal dialysis: Secondary | ICD-10-CM | POA: Diagnosis not present

## 2017-08-12 DIAGNOSIS — N186 End stage renal disease: Secondary | ICD-10-CM | POA: Diagnosis not present

## 2017-08-12 DIAGNOSIS — D509 Iron deficiency anemia, unspecified: Secondary | ICD-10-CM | POA: Diagnosis not present

## 2017-08-12 DIAGNOSIS — D631 Anemia in chronic kidney disease: Secondary | ICD-10-CM | POA: Diagnosis not present

## 2017-08-13 DIAGNOSIS — N186 End stage renal disease: Secondary | ICD-10-CM | POA: Diagnosis not present

## 2017-08-13 DIAGNOSIS — Z992 Dependence on renal dialysis: Secondary | ICD-10-CM | POA: Diagnosis not present

## 2017-08-13 DIAGNOSIS — D509 Iron deficiency anemia, unspecified: Secondary | ICD-10-CM | POA: Diagnosis not present

## 2017-08-13 DIAGNOSIS — D631 Anemia in chronic kidney disease: Secondary | ICD-10-CM | POA: Diagnosis not present

## 2017-08-14 DIAGNOSIS — D631 Anemia in chronic kidney disease: Secondary | ICD-10-CM | POA: Diagnosis not present

## 2017-08-14 DIAGNOSIS — N186 End stage renal disease: Secondary | ICD-10-CM | POA: Diagnosis not present

## 2017-08-14 DIAGNOSIS — D509 Iron deficiency anemia, unspecified: Secondary | ICD-10-CM | POA: Diagnosis not present

## 2017-08-14 DIAGNOSIS — Z992 Dependence on renal dialysis: Secondary | ICD-10-CM | POA: Diagnosis not present

## 2017-08-15 DIAGNOSIS — N186 End stage renal disease: Secondary | ICD-10-CM | POA: Diagnosis not present

## 2017-08-15 DIAGNOSIS — D509 Iron deficiency anemia, unspecified: Secondary | ICD-10-CM | POA: Diagnosis not present

## 2017-08-15 DIAGNOSIS — Z992 Dependence on renal dialysis: Secondary | ICD-10-CM | POA: Diagnosis not present

## 2017-08-15 DIAGNOSIS — D631 Anemia in chronic kidney disease: Secondary | ICD-10-CM | POA: Diagnosis not present

## 2017-08-16 DIAGNOSIS — K7460 Unspecified cirrhosis of liver: Secondary | ICD-10-CM | POA: Diagnosis not present

## 2017-08-16 DIAGNOSIS — D631 Anemia in chronic kidney disease: Secondary | ICD-10-CM | POA: Diagnosis not present

## 2017-08-16 DIAGNOSIS — J9811 Atelectasis: Secondary | ICD-10-CM | POA: Diagnosis not present

## 2017-08-16 DIAGNOSIS — J189 Pneumonia, unspecified organism: Secondary | ICD-10-CM | POA: Diagnosis not present

## 2017-08-16 DIAGNOSIS — Z992 Dependence on renal dialysis: Secondary | ICD-10-CM | POA: Diagnosis not present

## 2017-08-16 DIAGNOSIS — J90 Pleural effusion, not elsewhere classified: Secondary | ICD-10-CM | POA: Diagnosis not present

## 2017-08-16 DIAGNOSIS — R188 Other ascites: Secondary | ICD-10-CM | POA: Diagnosis not present

## 2017-08-16 DIAGNOSIS — J918 Pleural effusion in other conditions classified elsewhere: Secondary | ICD-10-CM | POA: Diagnosis not present

## 2017-08-16 DIAGNOSIS — I2510 Atherosclerotic heart disease of native coronary artery without angina pectoris: Secondary | ICD-10-CM | POA: Diagnosis not present

## 2017-08-16 DIAGNOSIS — D509 Iron deficiency anemia, unspecified: Secondary | ICD-10-CM | POA: Diagnosis not present

## 2017-08-16 DIAGNOSIS — K8020 Calculus of gallbladder without cholecystitis without obstruction: Secondary | ICD-10-CM | POA: Diagnosis not present

## 2017-08-16 DIAGNOSIS — N186 End stage renal disease: Secondary | ICD-10-CM | POA: Diagnosis not present

## 2017-08-17 DIAGNOSIS — N186 End stage renal disease: Secondary | ICD-10-CM | POA: Diagnosis not present

## 2017-08-17 DIAGNOSIS — Z992 Dependence on renal dialysis: Secondary | ICD-10-CM | POA: Diagnosis not present

## 2017-08-17 DIAGNOSIS — D509 Iron deficiency anemia, unspecified: Secondary | ICD-10-CM | POA: Diagnosis not present

## 2017-08-17 DIAGNOSIS — D631 Anemia in chronic kidney disease: Secondary | ICD-10-CM | POA: Diagnosis not present

## 2017-08-18 DIAGNOSIS — D631 Anemia in chronic kidney disease: Secondary | ICD-10-CM | POA: Diagnosis not present

## 2017-08-18 DIAGNOSIS — D509 Iron deficiency anemia, unspecified: Secondary | ICD-10-CM | POA: Diagnosis not present

## 2017-08-18 DIAGNOSIS — Z992 Dependence on renal dialysis: Secondary | ICD-10-CM | POA: Diagnosis not present

## 2017-08-18 DIAGNOSIS — N186 End stage renal disease: Secondary | ICD-10-CM | POA: Diagnosis not present

## 2017-08-19 DIAGNOSIS — D631 Anemia in chronic kidney disease: Secondary | ICD-10-CM | POA: Diagnosis not present

## 2017-08-19 DIAGNOSIS — D509 Iron deficiency anemia, unspecified: Secondary | ICD-10-CM | POA: Diagnosis not present

## 2017-08-19 DIAGNOSIS — N186 End stage renal disease: Secondary | ICD-10-CM | POA: Diagnosis not present

## 2017-08-19 DIAGNOSIS — Z992 Dependence on renal dialysis: Secondary | ICD-10-CM | POA: Diagnosis not present

## 2017-08-20 DIAGNOSIS — Z992 Dependence on renal dialysis: Secondary | ICD-10-CM | POA: Diagnosis not present

## 2017-08-20 DIAGNOSIS — D509 Iron deficiency anemia, unspecified: Secondary | ICD-10-CM | POA: Diagnosis not present

## 2017-08-20 DIAGNOSIS — D631 Anemia in chronic kidney disease: Secondary | ICD-10-CM | POA: Diagnosis not present

## 2017-08-20 DIAGNOSIS — N186 End stage renal disease: Secondary | ICD-10-CM | POA: Diagnosis not present

## 2017-08-21 DIAGNOSIS — D631 Anemia in chronic kidney disease: Secondary | ICD-10-CM | POA: Diagnosis not present

## 2017-08-21 DIAGNOSIS — D509 Iron deficiency anemia, unspecified: Secondary | ICD-10-CM | POA: Diagnosis not present

## 2017-08-21 DIAGNOSIS — N186 End stage renal disease: Secondary | ICD-10-CM | POA: Diagnosis not present

## 2017-08-21 DIAGNOSIS — Z992 Dependence on renal dialysis: Secondary | ICD-10-CM | POA: Diagnosis not present

## 2017-08-22 DIAGNOSIS — N186 End stage renal disease: Secondary | ICD-10-CM | POA: Diagnosis not present

## 2017-08-22 DIAGNOSIS — D631 Anemia in chronic kidney disease: Secondary | ICD-10-CM | POA: Diagnosis not present

## 2017-08-22 DIAGNOSIS — D509 Iron deficiency anemia, unspecified: Secondary | ICD-10-CM | POA: Diagnosis not present

## 2017-08-22 DIAGNOSIS — Z992 Dependence on renal dialysis: Secondary | ICD-10-CM | POA: Diagnosis not present

## 2017-08-23 DIAGNOSIS — D631 Anemia in chronic kidney disease: Secondary | ICD-10-CM | POA: Diagnosis not present

## 2017-08-23 DIAGNOSIS — D509 Iron deficiency anemia, unspecified: Secondary | ICD-10-CM | POA: Diagnosis not present

## 2017-08-23 DIAGNOSIS — Z992 Dependence on renal dialysis: Secondary | ICD-10-CM | POA: Diagnosis not present

## 2017-08-23 DIAGNOSIS — N186 End stage renal disease: Secondary | ICD-10-CM | POA: Diagnosis not present

## 2017-08-24 DIAGNOSIS — D509 Iron deficiency anemia, unspecified: Secondary | ICD-10-CM | POA: Diagnosis not present

## 2017-08-24 DIAGNOSIS — D631 Anemia in chronic kidney disease: Secondary | ICD-10-CM | POA: Diagnosis not present

## 2017-08-24 DIAGNOSIS — N186 End stage renal disease: Secondary | ICD-10-CM | POA: Diagnosis not present

## 2017-08-24 DIAGNOSIS — Z992 Dependence on renal dialysis: Secondary | ICD-10-CM | POA: Diagnosis not present

## 2017-08-25 DIAGNOSIS — N186 End stage renal disease: Secondary | ICD-10-CM | POA: Diagnosis not present

## 2017-08-25 DIAGNOSIS — D631 Anemia in chronic kidney disease: Secondary | ICD-10-CM | POA: Diagnosis not present

## 2017-08-25 DIAGNOSIS — Z992 Dependence on renal dialysis: Secondary | ICD-10-CM | POA: Diagnosis not present

## 2017-08-25 DIAGNOSIS — D509 Iron deficiency anemia, unspecified: Secondary | ICD-10-CM | POA: Diagnosis not present

## 2017-08-26 DIAGNOSIS — N186 End stage renal disease: Secondary | ICD-10-CM | POA: Diagnosis not present

## 2017-08-26 DIAGNOSIS — D631 Anemia in chronic kidney disease: Secondary | ICD-10-CM | POA: Diagnosis not present

## 2017-08-26 DIAGNOSIS — D509 Iron deficiency anemia, unspecified: Secondary | ICD-10-CM | POA: Diagnosis not present

## 2017-08-26 DIAGNOSIS — Z992 Dependence on renal dialysis: Secondary | ICD-10-CM | POA: Diagnosis not present

## 2017-08-27 DIAGNOSIS — D509 Iron deficiency anemia, unspecified: Secondary | ICD-10-CM | POA: Diagnosis not present

## 2017-08-27 DIAGNOSIS — N186 End stage renal disease: Secondary | ICD-10-CM | POA: Diagnosis not present

## 2017-08-27 DIAGNOSIS — D631 Anemia in chronic kidney disease: Secondary | ICD-10-CM | POA: Diagnosis not present

## 2017-08-27 DIAGNOSIS — Z992 Dependence on renal dialysis: Secondary | ICD-10-CM | POA: Diagnosis not present

## 2017-08-28 DIAGNOSIS — N186 End stage renal disease: Secondary | ICD-10-CM | POA: Diagnosis not present

## 2017-08-28 DIAGNOSIS — Z992 Dependence on renal dialysis: Secondary | ICD-10-CM | POA: Diagnosis not present

## 2017-08-28 DIAGNOSIS — D509 Iron deficiency anemia, unspecified: Secondary | ICD-10-CM | POA: Diagnosis not present

## 2017-08-28 DIAGNOSIS — D631 Anemia in chronic kidney disease: Secondary | ICD-10-CM | POA: Diagnosis not present

## 2017-08-29 DIAGNOSIS — Z992 Dependence on renal dialysis: Secondary | ICD-10-CM | POA: Diagnosis not present

## 2017-08-29 DIAGNOSIS — D509 Iron deficiency anemia, unspecified: Secondary | ICD-10-CM | POA: Diagnosis not present

## 2017-08-29 DIAGNOSIS — D631 Anemia in chronic kidney disease: Secondary | ICD-10-CM | POA: Diagnosis not present

## 2017-08-29 DIAGNOSIS — N186 End stage renal disease: Secondary | ICD-10-CM | POA: Diagnosis not present

## 2017-08-30 DIAGNOSIS — D509 Iron deficiency anemia, unspecified: Secondary | ICD-10-CM | POA: Diagnosis not present

## 2017-08-30 DIAGNOSIS — Z992 Dependence on renal dialysis: Secondary | ICD-10-CM | POA: Diagnosis not present

## 2017-08-30 DIAGNOSIS — D631 Anemia in chronic kidney disease: Secondary | ICD-10-CM | POA: Diagnosis not present

## 2017-08-30 DIAGNOSIS — N186 End stage renal disease: Secondary | ICD-10-CM | POA: Diagnosis not present

## 2017-08-31 ENCOUNTER — Encounter (INDEPENDENT_AMBULATORY_CARE_PROVIDER_SITE_OTHER): Payer: BLUE CROSS/BLUE SHIELD

## 2017-08-31 ENCOUNTER — Other Ambulatory Visit (INDEPENDENT_AMBULATORY_CARE_PROVIDER_SITE_OTHER): Payer: BLUE CROSS/BLUE SHIELD

## 2017-08-31 ENCOUNTER — Encounter (INDEPENDENT_AMBULATORY_CARE_PROVIDER_SITE_OTHER): Payer: BLUE CROSS/BLUE SHIELD | Admitting: Vascular Surgery

## 2017-08-31 DIAGNOSIS — D631 Anemia in chronic kidney disease: Secondary | ICD-10-CM | POA: Diagnosis not present

## 2017-08-31 DIAGNOSIS — Z992 Dependence on renal dialysis: Secondary | ICD-10-CM | POA: Diagnosis not present

## 2017-08-31 DIAGNOSIS — D509 Iron deficiency anemia, unspecified: Secondary | ICD-10-CM | POA: Diagnosis not present

## 2017-08-31 DIAGNOSIS — N186 End stage renal disease: Secondary | ICD-10-CM | POA: Diagnosis not present

## 2017-09-01 DIAGNOSIS — D509 Iron deficiency anemia, unspecified: Secondary | ICD-10-CM | POA: Diagnosis not present

## 2017-09-01 DIAGNOSIS — N186 End stage renal disease: Secondary | ICD-10-CM | POA: Diagnosis not present

## 2017-09-01 DIAGNOSIS — D631 Anemia in chronic kidney disease: Secondary | ICD-10-CM | POA: Diagnosis not present

## 2017-09-01 DIAGNOSIS — Z992 Dependence on renal dialysis: Secondary | ICD-10-CM | POA: Diagnosis not present

## 2017-09-02 DIAGNOSIS — N186 End stage renal disease: Secondary | ICD-10-CM | POA: Diagnosis not present

## 2017-09-02 DIAGNOSIS — D509 Iron deficiency anemia, unspecified: Secondary | ICD-10-CM | POA: Diagnosis not present

## 2017-09-02 DIAGNOSIS — Z992 Dependence on renal dialysis: Secondary | ICD-10-CM | POA: Diagnosis not present

## 2017-09-02 DIAGNOSIS — D631 Anemia in chronic kidney disease: Secondary | ICD-10-CM | POA: Diagnosis not present

## 2017-09-03 DIAGNOSIS — D509 Iron deficiency anemia, unspecified: Secondary | ICD-10-CM | POA: Diagnosis not present

## 2017-09-03 DIAGNOSIS — N186 End stage renal disease: Secondary | ICD-10-CM | POA: Diagnosis not present

## 2017-09-03 DIAGNOSIS — Z992 Dependence on renal dialysis: Secondary | ICD-10-CM | POA: Diagnosis not present

## 2017-09-03 DIAGNOSIS — D631 Anemia in chronic kidney disease: Secondary | ICD-10-CM | POA: Diagnosis not present

## 2017-09-04 DIAGNOSIS — D509 Iron deficiency anemia, unspecified: Secondary | ICD-10-CM | POA: Diagnosis not present

## 2017-09-04 DIAGNOSIS — Z992 Dependence on renal dialysis: Secondary | ICD-10-CM | POA: Diagnosis not present

## 2017-09-04 DIAGNOSIS — N186 End stage renal disease: Secondary | ICD-10-CM | POA: Diagnosis not present

## 2017-09-04 DIAGNOSIS — D631 Anemia in chronic kidney disease: Secondary | ICD-10-CM | POA: Diagnosis not present

## 2017-09-05 DIAGNOSIS — D631 Anemia in chronic kidney disease: Secondary | ICD-10-CM | POA: Diagnosis not present

## 2017-09-05 DIAGNOSIS — N186 End stage renal disease: Secondary | ICD-10-CM | POA: Diagnosis not present

## 2017-09-05 DIAGNOSIS — Z992 Dependence on renal dialysis: Secondary | ICD-10-CM | POA: Diagnosis not present

## 2017-09-05 DIAGNOSIS — D509 Iron deficiency anemia, unspecified: Secondary | ICD-10-CM | POA: Diagnosis not present

## 2017-09-06 DIAGNOSIS — Z992 Dependence on renal dialysis: Secondary | ICD-10-CM | POA: Diagnosis not present

## 2017-09-06 DIAGNOSIS — N186 End stage renal disease: Secondary | ICD-10-CM | POA: Diagnosis not present

## 2017-09-06 DIAGNOSIS — D509 Iron deficiency anemia, unspecified: Secondary | ICD-10-CM | POA: Diagnosis not present

## 2017-09-06 DIAGNOSIS — D631 Anemia in chronic kidney disease: Secondary | ICD-10-CM | POA: Diagnosis not present

## 2017-09-07 DIAGNOSIS — D631 Anemia in chronic kidney disease: Secondary | ICD-10-CM | POA: Diagnosis not present

## 2017-09-07 DIAGNOSIS — D509 Iron deficiency anemia, unspecified: Secondary | ICD-10-CM | POA: Diagnosis not present

## 2017-09-07 DIAGNOSIS — N186 End stage renal disease: Secondary | ICD-10-CM | POA: Diagnosis not present

## 2017-09-07 DIAGNOSIS — Z992 Dependence on renal dialysis: Secondary | ICD-10-CM | POA: Diagnosis not present

## 2017-09-08 DIAGNOSIS — Z992 Dependence on renal dialysis: Secondary | ICD-10-CM | POA: Diagnosis not present

## 2017-09-08 DIAGNOSIS — D509 Iron deficiency anemia, unspecified: Secondary | ICD-10-CM | POA: Diagnosis not present

## 2017-09-08 DIAGNOSIS — N186 End stage renal disease: Secondary | ICD-10-CM | POA: Diagnosis not present

## 2017-09-08 DIAGNOSIS — D631 Anemia in chronic kidney disease: Secondary | ICD-10-CM | POA: Diagnosis not present

## 2017-09-09 DIAGNOSIS — Z992 Dependence on renal dialysis: Secondary | ICD-10-CM | POA: Diagnosis not present

## 2017-09-09 DIAGNOSIS — D509 Iron deficiency anemia, unspecified: Secondary | ICD-10-CM | POA: Diagnosis not present

## 2017-09-09 DIAGNOSIS — D631 Anemia in chronic kidney disease: Secondary | ICD-10-CM | POA: Diagnosis not present

## 2017-09-09 DIAGNOSIS — N186 End stage renal disease: Secondary | ICD-10-CM | POA: Diagnosis not present

## 2017-09-10 DIAGNOSIS — N186 End stage renal disease: Secondary | ICD-10-CM | POA: Diagnosis not present

## 2017-09-10 DIAGNOSIS — D631 Anemia in chronic kidney disease: Secondary | ICD-10-CM | POA: Diagnosis not present

## 2017-09-10 DIAGNOSIS — Z992 Dependence on renal dialysis: Secondary | ICD-10-CM | POA: Diagnosis not present

## 2017-09-10 DIAGNOSIS — D509 Iron deficiency anemia, unspecified: Secondary | ICD-10-CM | POA: Diagnosis not present

## 2017-09-11 DIAGNOSIS — D631 Anemia in chronic kidney disease: Secondary | ICD-10-CM | POA: Diagnosis not present

## 2017-09-11 DIAGNOSIS — Z992 Dependence on renal dialysis: Secondary | ICD-10-CM | POA: Diagnosis not present

## 2017-09-11 DIAGNOSIS — N186 End stage renal disease: Secondary | ICD-10-CM | POA: Diagnosis not present

## 2017-09-11 DIAGNOSIS — D509 Iron deficiency anemia, unspecified: Secondary | ICD-10-CM | POA: Diagnosis not present

## 2017-09-12 ENCOUNTER — Ambulatory Visit (INDEPENDENT_AMBULATORY_CARE_PROVIDER_SITE_OTHER): Payer: Medicare Other

## 2017-09-12 ENCOUNTER — Ambulatory Visit (INDEPENDENT_AMBULATORY_CARE_PROVIDER_SITE_OTHER): Payer: Medicare Other | Admitting: Vascular Surgery

## 2017-09-12 ENCOUNTER — Encounter (INDEPENDENT_AMBULATORY_CARE_PROVIDER_SITE_OTHER): Payer: Self-pay | Admitting: Vascular Surgery

## 2017-09-12 ENCOUNTER — Other Ambulatory Visit (INDEPENDENT_AMBULATORY_CARE_PROVIDER_SITE_OTHER): Payer: Self-pay | Admitting: Vascular Surgery

## 2017-09-12 VITALS — BP 145/82 | HR 92 | Resp 16 | Ht 69.0 in | Wt 274.0 lb

## 2017-09-12 DIAGNOSIS — E118 Type 2 diabetes mellitus with unspecified complications: Secondary | ICD-10-CM

## 2017-09-12 DIAGNOSIS — N186 End stage renal disease: Secondary | ICD-10-CM

## 2017-09-12 DIAGNOSIS — N2581 Secondary hyperparathyroidism of renal origin: Secondary | ICD-10-CM | POA: Diagnosis not present

## 2017-09-12 DIAGNOSIS — I159 Secondary hypertension, unspecified: Secondary | ICD-10-CM | POA: Diagnosis not present

## 2017-09-12 DIAGNOSIS — D631 Anemia in chronic kidney disease: Secondary | ICD-10-CM | POA: Diagnosis not present

## 2017-09-12 DIAGNOSIS — Z992 Dependence on renal dialysis: Secondary | ICD-10-CM | POA: Diagnosis not present

## 2017-09-12 DIAGNOSIS — D509 Iron deficiency anemia, unspecified: Secondary | ICD-10-CM | POA: Diagnosis not present

## 2017-09-12 NOTE — Progress Notes (Signed)
Subjective:    Patient ID: Jake Gomez, male    DOB: Mar 03, 1971, 47 y.o.   MRN: [id number] Chief Complaint  Patient presents with  . New Patient (Initial Visit)    Vein mapping   Presents as a new patient referred by Dr. Candiss Norse for evaluation of hemodialysis access placement.  The patient is currently being maintained by a peritoneal dialysis catheter.  As per the patient, he has not been dialyzing on a normal basis and recently was hospitalized after becoming fluid overloaded complicated by possible pneumonia.  Due to compliance issues the patient's nephrologist felt a backup hemodialysis access would benefit the patient.  The patient is very unsure about moving forward with the creation of a hemodialysis access at this time.  Patient feels that if he dialyzes through his peritoneal dialysis catheter on a regular basis he will improve his uremic symptoms/fluid status.  The patient states that he has an appointment with his nephrologist in May 2019.  The patient would like to try to improve his appliance with dialysis and follow-up with his nephrologist in May 2019 before moving forward with surgery.  The patient denies any issues with his peritoneal dialysis catheter at this time.  The patient underwent bilateral upper extremity vein mapping which was notable for creation of a left brachiocephalic AV fistula.  Patient is right-hand dominant.  The patient denies any fever, nausea or vomiting.  Review of Systems  Constitutional: Negative.   HENT: Negative.   Eyes: Negative.   Respiratory: Negative.   Cardiovascular: Negative.   Gastrointestinal: Negative.   Endocrine: Negative.   Genitourinary:       ESRD  Musculoskeletal: Negative.   Skin: Negative.   Allergic/Immunologic: Negative.   Neurological: Negative.   Hematological: Negative.   Psychiatric/Behavioral: Negative.       Objective:   Physical Exam  Constitutional: He is oriented to person, place, and time. He appears  well-developed and well-nourished. No distress.  HENT:  Head: Normocephalic and atraumatic.  Eyes: Pupils are equal, round, and reactive to light. Conjunctivae are normal.  Neck: Normal range of motion.  Cardiovascular: Normal rate, regular rhythm, normal heart sounds and intact distal pulses.  Pulses:      Radial pulses are 2+ on the right side, and 2+ on the left side.  Pulmonary/Chest: Effort normal and breath sounds normal.  Abdominal: Soft. Bowel sounds are normal.  Peritoneal dialysis catheter intact and in place.  No signs of infection noted.  Musculoskeletal: Normal range of motion. He exhibits no edema.  Neurological: He is alert and oriented to person, place, and time.  Skin: Skin is warm and dry. He is not diaphoretic.  Psychiatric: He has a normal mood and affect. His behavior is normal. Judgment and thought content normal.  Vitals reviewed.  BP (!) 145/82 (BP Location: Right Arm, Patient Position: Sitting)   Pulse 92   Resp 16   Ht 5\' 9"  (1.753 m)   Wt 274 lb (124.3 kg)   BMI 40.46 kg/m   Past Medical History:  Diagnosis Date  . Diabetes mellitus without complication ([place])   . Hypertension   . Renal disorder    Social History   Socioeconomic History  . Marital status: Single    Spouse name: Not on file  . Number of children: Not on file  . Years of education: Not on file  . Highest education level: Not on file  Occupational History  . Not on file  Social Needs  . Emergency planning/management officer  strain: Not on file  . Food insecurity:    Worry: Not on file    Inability: Not on file  . Transportation needs:    Medical: Not on file    Non-medical: Not on file  Tobacco Use  . Smoking status: Former Research scientist (life sciences)  . Smokeless tobacco: Former Systems developer    Quit date: 07/16/2009  Substance and Sexual Activity  . Alcohol use: No    Frequency: Never  . Drug use: No  . Sexual activity: Not on file  Lifestyle  . Physical activity:    Days per week: Not on file    Minutes per  session: Not on file  . Stress: Not on file  Relationships  . Social connections:    Talks on phone: Not on file    Gets together: Not on file    Attends religious service: Not on file    Active member of club or organization: Not on file    Attends meetings of clubs or organizations: Not on file    Relationship status: Not on file  . Intimate partner violence:    Fear of current or ex partner: Not on file    Emotionally abused: Not on file    Physically abused: Not on file    Forced sexual activity: Not on file  Other Topics Concern  . Not on file  Social History Narrative  . Not on file   Past Surgical History:  Procedure Laterality Date  . peritonal dialysis     History reviewed. No pertinent family history.  No Known Allergies     Assessment & Plan:  Presents as a new patient referred by Dr. Candiss Norse for evaluation of hemodialysis access placement.  The patient is currently being maintained by a peritoneal dialysis catheter.  As per the patient, he has not been dialyzing on a normal basis and recently was hospitalized after becoming fluid overloaded complicated by possible pneumonia.  Due to compliance issues the patient's nephrologist felt a backup hemodialysis access would benefit the patient.  The patient is very unsure about moving forward with the creation of a hemodialysis access at this time.  Patient feels that if he dialyzes through his peritoneal dialysis catheter on a regular basis he will improve his uremic symptoms/fluid status.  The patient states that he has an appointment with his nephrologist in May 2019.  The patient would like to try to improve his appliance with dialysis and follow-up with his nephrologist in May 2019 before moving forward with surgery.  The patient denies any issues with his peritoneal dialysis catheter at this time.  The patient underwent bilateral upper extremity vein mapping which was notable for creation of a left brachiocephalic AV fistula.   Patient is right-hand dominant.  The patient denies any fever, nausea or vomiting.  1. ESRD on peritoneal dialysis ([place]) - Stable The patient admits to not being very compliant with daily use of his peritoneal dialysis The patient has been hospitalized for fluid overload and subsequent pneumonia and his nephrologist felt a backup hemodialysis access would be beneficial The patient underwent bilateral upper extremity vein mapping which was notable for creation of a left brachiocephalic AV fistula Procedure, risks and benefits explained to the patient At this time, the patient is not comfortable moving forward with the creation of a hemodialysis access The patient would like to become more compliant with peritoneal dialysis and follow-up with his nephrologist in May 2019.  If his nephrologist still feels he needs a backup hemodialysis  access the patient will move forward with surgery at that time The patient knows to call the office if he changes his mind sooner  2. Type 2 diabetes mellitus with complication, unspecified whether long term insulin use (HCC) - Stable Encouraged good control as its slows the progression of atherosclerotic disease  3. Secondary hypertension - Stable Encouraged good control as its slows the progression of atherosclerotic disease  Current Outpatient Medications on File Prior to Visit  Medication Sig Dispense Refill  . acetaminophen (TYLENOL) 500 MG tablet Take 500 mg by mouth.    Marland Kitchen amLODipine (NORVASC) 5 MG tablet Take 10 mg by mouth 2 (two) times daily.     Marland Kitchen atorvastatin (LIPITOR) 80 MG tablet Take 80 mg by mouth.    . bumetanide (BUMEX) 2 MG tablet Take 2 mg by mouth daily.     . calcium acetate (PHOSLO) 667 MG capsule Take 2,001 mg by mouth 3 (three) times daily with meals.     Marland Kitchen doxycycline (VIBRA-TABS) 100 MG tablet Take 100 mg by mouth daily.    . irbesartan (AVAPRO) 300 MG tablet Take 300 mg by mouth at bedtime.     Marland Kitchen labetalol (NORMODYNE) 200 MG tablet  Take 200 mg by mouth 2 (two) times daily.     . Vitamin D, Ergocalciferol, (DRISDOL) 50000 units CAPS capsule Take 50,000 Units by mouth every 7 (seven) days.      No current facility-administered medications on file prior to visit.     There are no Patient Instructions on file for this visit. No follow-ups on file.   Lilie Vezina A Shakoya Gilmore, PA-C

## 2017-09-13 DIAGNOSIS — D509 Iron deficiency anemia, unspecified: Secondary | ICD-10-CM | POA: Diagnosis not present

## 2017-09-13 DIAGNOSIS — D631 Anemia in chronic kidney disease: Secondary | ICD-10-CM | POA: Diagnosis not present

## 2017-09-13 DIAGNOSIS — N186 End stage renal disease: Secondary | ICD-10-CM | POA: Diagnosis not present

## 2017-09-13 DIAGNOSIS — N2581 Secondary hyperparathyroidism of renal origin: Secondary | ICD-10-CM | POA: Diagnosis not present

## 2017-09-13 DIAGNOSIS — Z992 Dependence on renal dialysis: Secondary | ICD-10-CM | POA: Diagnosis not present

## 2017-09-14 DIAGNOSIS — D631 Anemia in chronic kidney disease: Secondary | ICD-10-CM | POA: Diagnosis not present

## 2017-09-14 DIAGNOSIS — D509 Iron deficiency anemia, unspecified: Secondary | ICD-10-CM | POA: Diagnosis not present

## 2017-09-14 DIAGNOSIS — Z992 Dependence on renal dialysis: Secondary | ICD-10-CM | POA: Diagnosis not present

## 2017-09-14 DIAGNOSIS — N186 End stage renal disease: Secondary | ICD-10-CM | POA: Diagnosis not present

## 2017-09-14 DIAGNOSIS — N2581 Secondary hyperparathyroidism of renal origin: Secondary | ICD-10-CM | POA: Diagnosis not present

## 2017-09-15 DIAGNOSIS — N186 End stage renal disease: Secondary | ICD-10-CM | POA: Diagnosis not present

## 2017-09-15 DIAGNOSIS — N2581 Secondary hyperparathyroidism of renal origin: Secondary | ICD-10-CM | POA: Diagnosis not present

## 2017-09-15 DIAGNOSIS — D631 Anemia in chronic kidney disease: Secondary | ICD-10-CM | POA: Diagnosis not present

## 2017-09-15 DIAGNOSIS — J90 Pleural effusion, not elsewhere classified: Secondary | ICD-10-CM | POA: Diagnosis not present

## 2017-09-15 DIAGNOSIS — Z6841 Body Mass Index (BMI) 40.0 and over, adult: Secondary | ICD-10-CM | POA: Diagnosis not present

## 2017-09-15 DIAGNOSIS — Z992 Dependence on renal dialysis: Secondary | ICD-10-CM | POA: Diagnosis not present

## 2017-09-15 DIAGNOSIS — D509 Iron deficiency anemia, unspecified: Secondary | ICD-10-CM | POA: Diagnosis not present

## 2017-09-16 DIAGNOSIS — N186 End stage renal disease: Secondary | ICD-10-CM | POA: Diagnosis not present

## 2017-09-16 DIAGNOSIS — N2581 Secondary hyperparathyroidism of renal origin: Secondary | ICD-10-CM | POA: Diagnosis not present

## 2017-09-16 DIAGNOSIS — E119 Type 2 diabetes mellitus without complications: Secondary | ICD-10-CM | POA: Diagnosis not present

## 2017-09-16 DIAGNOSIS — Z992 Dependence on renal dialysis: Secondary | ICD-10-CM | POA: Diagnosis not present

## 2017-09-16 DIAGNOSIS — D631 Anemia in chronic kidney disease: Secondary | ICD-10-CM | POA: Diagnosis not present

## 2017-09-16 DIAGNOSIS — D509 Iron deficiency anemia, unspecified: Secondary | ICD-10-CM | POA: Diagnosis not present

## 2017-09-17 DIAGNOSIS — Z992 Dependence on renal dialysis: Secondary | ICD-10-CM | POA: Diagnosis not present

## 2017-09-17 DIAGNOSIS — D631 Anemia in chronic kidney disease: Secondary | ICD-10-CM | POA: Diagnosis not present

## 2017-09-17 DIAGNOSIS — D509 Iron deficiency anemia, unspecified: Secondary | ICD-10-CM | POA: Diagnosis not present

## 2017-09-17 DIAGNOSIS — N2581 Secondary hyperparathyroidism of renal origin: Secondary | ICD-10-CM | POA: Diagnosis not present

## 2017-09-17 DIAGNOSIS — N186 End stage renal disease: Secondary | ICD-10-CM | POA: Diagnosis not present

## 2017-09-18 DIAGNOSIS — D509 Iron deficiency anemia, unspecified: Secondary | ICD-10-CM | POA: Diagnosis not present

## 2017-09-18 DIAGNOSIS — Z992 Dependence on renal dialysis: Secondary | ICD-10-CM | POA: Diagnosis not present

## 2017-09-18 DIAGNOSIS — N2581 Secondary hyperparathyroidism of renal origin: Secondary | ICD-10-CM | POA: Diagnosis not present

## 2017-09-18 DIAGNOSIS — N186 End stage renal disease: Secondary | ICD-10-CM | POA: Diagnosis not present

## 2017-09-18 DIAGNOSIS — D631 Anemia in chronic kidney disease: Secondary | ICD-10-CM | POA: Diagnosis not present

## 2017-09-19 DIAGNOSIS — D509 Iron deficiency anemia, unspecified: Secondary | ICD-10-CM | POA: Diagnosis not present

## 2017-09-19 DIAGNOSIS — D631 Anemia in chronic kidney disease: Secondary | ICD-10-CM | POA: Diagnosis not present

## 2017-09-19 DIAGNOSIS — N186 End stage renal disease: Secondary | ICD-10-CM | POA: Diagnosis not present

## 2017-09-19 DIAGNOSIS — N2581 Secondary hyperparathyroidism of renal origin: Secondary | ICD-10-CM | POA: Diagnosis not present

## 2017-09-19 DIAGNOSIS — Z992 Dependence on renal dialysis: Secondary | ICD-10-CM | POA: Diagnosis not present

## 2017-09-20 DIAGNOSIS — D631 Anemia in chronic kidney disease: Secondary | ICD-10-CM | POA: Diagnosis not present

## 2017-09-20 DIAGNOSIS — D509 Iron deficiency anemia, unspecified: Secondary | ICD-10-CM | POA: Diagnosis not present

## 2017-09-20 DIAGNOSIS — Z992 Dependence on renal dialysis: Secondary | ICD-10-CM | POA: Diagnosis not present

## 2017-09-20 DIAGNOSIS — N2581 Secondary hyperparathyroidism of renal origin: Secondary | ICD-10-CM | POA: Diagnosis not present

## 2017-09-20 DIAGNOSIS — N186 End stage renal disease: Secondary | ICD-10-CM | POA: Diagnosis not present

## 2017-09-21 DIAGNOSIS — D509 Iron deficiency anemia, unspecified: Secondary | ICD-10-CM | POA: Diagnosis not present

## 2017-09-21 DIAGNOSIS — D631 Anemia in chronic kidney disease: Secondary | ICD-10-CM | POA: Diagnosis not present

## 2017-09-21 DIAGNOSIS — N186 End stage renal disease: Secondary | ICD-10-CM | POA: Diagnosis not present

## 2017-09-21 DIAGNOSIS — Z992 Dependence on renal dialysis: Secondary | ICD-10-CM | POA: Diagnosis not present

## 2017-09-21 DIAGNOSIS — N2581 Secondary hyperparathyroidism of renal origin: Secondary | ICD-10-CM | POA: Diagnosis not present

## 2017-09-22 DIAGNOSIS — D631 Anemia in chronic kidney disease: Secondary | ICD-10-CM | POA: Diagnosis not present

## 2017-09-22 DIAGNOSIS — Z992 Dependence on renal dialysis: Secondary | ICD-10-CM | POA: Diagnosis not present

## 2017-09-22 DIAGNOSIS — N186 End stage renal disease: Secondary | ICD-10-CM | POA: Diagnosis not present

## 2017-09-22 DIAGNOSIS — D509 Iron deficiency anemia, unspecified: Secondary | ICD-10-CM | POA: Diagnosis not present

## 2017-09-22 DIAGNOSIS — N2581 Secondary hyperparathyroidism of renal origin: Secondary | ICD-10-CM | POA: Diagnosis not present

## 2017-09-23 DIAGNOSIS — N2581 Secondary hyperparathyroidism of renal origin: Secondary | ICD-10-CM | POA: Diagnosis not present

## 2017-09-23 DIAGNOSIS — N186 End stage renal disease: Secondary | ICD-10-CM | POA: Diagnosis not present

## 2017-09-23 DIAGNOSIS — D631 Anemia in chronic kidney disease: Secondary | ICD-10-CM | POA: Diagnosis not present

## 2017-09-23 DIAGNOSIS — D509 Iron deficiency anemia, unspecified: Secondary | ICD-10-CM | POA: Diagnosis not present

## 2017-09-23 DIAGNOSIS — Z992 Dependence on renal dialysis: Secondary | ICD-10-CM | POA: Diagnosis not present

## 2017-09-24 DIAGNOSIS — Z992 Dependence on renal dialysis: Secondary | ICD-10-CM | POA: Diagnosis not present

## 2017-09-24 DIAGNOSIS — N2581 Secondary hyperparathyroidism of renal origin: Secondary | ICD-10-CM | POA: Diagnosis not present

## 2017-09-24 DIAGNOSIS — N186 End stage renal disease: Secondary | ICD-10-CM | POA: Diagnosis not present

## 2017-09-24 DIAGNOSIS — D631 Anemia in chronic kidney disease: Secondary | ICD-10-CM | POA: Diagnosis not present

## 2017-09-24 DIAGNOSIS — D509 Iron deficiency anemia, unspecified: Secondary | ICD-10-CM | POA: Diagnosis not present

## 2017-09-25 DIAGNOSIS — Z992 Dependence on renal dialysis: Secondary | ICD-10-CM | POA: Diagnosis not present

## 2017-09-25 DIAGNOSIS — D631 Anemia in chronic kidney disease: Secondary | ICD-10-CM | POA: Diagnosis not present

## 2017-09-25 DIAGNOSIS — N186 End stage renal disease: Secondary | ICD-10-CM | POA: Diagnosis not present

## 2017-09-25 DIAGNOSIS — N2581 Secondary hyperparathyroidism of renal origin: Secondary | ICD-10-CM | POA: Diagnosis not present

## 2017-09-25 DIAGNOSIS — D509 Iron deficiency anemia, unspecified: Secondary | ICD-10-CM | POA: Diagnosis not present

## 2017-09-26 DIAGNOSIS — N186 End stage renal disease: Secondary | ICD-10-CM | POA: Diagnosis not present

## 2017-09-26 DIAGNOSIS — D509 Iron deficiency anemia, unspecified: Secondary | ICD-10-CM | POA: Diagnosis not present

## 2017-09-26 DIAGNOSIS — Z992 Dependence on renal dialysis: Secondary | ICD-10-CM | POA: Diagnosis not present

## 2017-09-26 DIAGNOSIS — D631 Anemia in chronic kidney disease: Secondary | ICD-10-CM | POA: Diagnosis not present

## 2017-09-26 DIAGNOSIS — N2581 Secondary hyperparathyroidism of renal origin: Secondary | ICD-10-CM | POA: Diagnosis not present

## 2017-09-27 DIAGNOSIS — N2581 Secondary hyperparathyroidism of renal origin: Secondary | ICD-10-CM | POA: Diagnosis not present

## 2017-09-27 DIAGNOSIS — D631 Anemia in chronic kidney disease: Secondary | ICD-10-CM | POA: Diagnosis not present

## 2017-09-27 DIAGNOSIS — D509 Iron deficiency anemia, unspecified: Secondary | ICD-10-CM | POA: Diagnosis not present

## 2017-09-27 DIAGNOSIS — Z992 Dependence on renal dialysis: Secondary | ICD-10-CM | POA: Diagnosis not present

## 2017-09-27 DIAGNOSIS — N186 End stage renal disease: Secondary | ICD-10-CM | POA: Diagnosis not present

## 2017-09-28 DIAGNOSIS — N2581 Secondary hyperparathyroidism of renal origin: Secondary | ICD-10-CM | POA: Diagnosis not present

## 2017-09-28 DIAGNOSIS — D509 Iron deficiency anemia, unspecified: Secondary | ICD-10-CM | POA: Diagnosis not present

## 2017-09-28 DIAGNOSIS — D631 Anemia in chronic kidney disease: Secondary | ICD-10-CM | POA: Diagnosis not present

## 2017-09-28 DIAGNOSIS — Z992 Dependence on renal dialysis: Secondary | ICD-10-CM | POA: Diagnosis not present

## 2017-09-28 DIAGNOSIS — N186 End stage renal disease: Secondary | ICD-10-CM | POA: Diagnosis not present

## 2017-09-29 DIAGNOSIS — Z992 Dependence on renal dialysis: Secondary | ICD-10-CM | POA: Diagnosis not present

## 2017-09-29 DIAGNOSIS — D509 Iron deficiency anemia, unspecified: Secondary | ICD-10-CM | POA: Diagnosis not present

## 2017-09-29 DIAGNOSIS — N2581 Secondary hyperparathyroidism of renal origin: Secondary | ICD-10-CM | POA: Diagnosis not present

## 2017-09-29 DIAGNOSIS — D631 Anemia in chronic kidney disease: Secondary | ICD-10-CM | POA: Diagnosis not present

## 2017-09-29 DIAGNOSIS — N186 End stage renal disease: Secondary | ICD-10-CM | POA: Diagnosis not present

## 2017-09-30 DIAGNOSIS — Z992 Dependence on renal dialysis: Secondary | ICD-10-CM | POA: Diagnosis not present

## 2017-09-30 DIAGNOSIS — D509 Iron deficiency anemia, unspecified: Secondary | ICD-10-CM | POA: Diagnosis not present

## 2017-09-30 DIAGNOSIS — D631 Anemia in chronic kidney disease: Secondary | ICD-10-CM | POA: Diagnosis not present

## 2017-09-30 DIAGNOSIS — N186 End stage renal disease: Secondary | ICD-10-CM | POA: Diagnosis not present

## 2017-09-30 DIAGNOSIS — N2581 Secondary hyperparathyroidism of renal origin: Secondary | ICD-10-CM | POA: Diagnosis not present

## 2017-10-01 DIAGNOSIS — Z992 Dependence on renal dialysis: Secondary | ICD-10-CM | POA: Diagnosis not present

## 2017-10-01 DIAGNOSIS — N2581 Secondary hyperparathyroidism of renal origin: Secondary | ICD-10-CM | POA: Diagnosis not present

## 2017-10-01 DIAGNOSIS — N186 End stage renal disease: Secondary | ICD-10-CM | POA: Diagnosis not present

## 2017-10-01 DIAGNOSIS — D631 Anemia in chronic kidney disease: Secondary | ICD-10-CM | POA: Diagnosis not present

## 2017-10-01 DIAGNOSIS — D509 Iron deficiency anemia, unspecified: Secondary | ICD-10-CM | POA: Diagnosis not present

## 2017-10-02 DIAGNOSIS — D509 Iron deficiency anemia, unspecified: Secondary | ICD-10-CM | POA: Diagnosis not present

## 2017-10-02 DIAGNOSIS — Z992 Dependence on renal dialysis: Secondary | ICD-10-CM | POA: Diagnosis not present

## 2017-10-02 DIAGNOSIS — N2581 Secondary hyperparathyroidism of renal origin: Secondary | ICD-10-CM | POA: Diagnosis not present

## 2017-10-02 DIAGNOSIS — N186 End stage renal disease: Secondary | ICD-10-CM | POA: Diagnosis not present

## 2017-10-02 DIAGNOSIS — D631 Anemia in chronic kidney disease: Secondary | ICD-10-CM | POA: Diagnosis not present

## 2017-10-03 DIAGNOSIS — N2581 Secondary hyperparathyroidism of renal origin: Secondary | ICD-10-CM | POA: Diagnosis not present

## 2017-10-03 DIAGNOSIS — D509 Iron deficiency anemia, unspecified: Secondary | ICD-10-CM | POA: Diagnosis not present

## 2017-10-03 DIAGNOSIS — D631 Anemia in chronic kidney disease: Secondary | ICD-10-CM | POA: Diagnosis not present

## 2017-10-03 DIAGNOSIS — N186 End stage renal disease: Secondary | ICD-10-CM | POA: Diagnosis not present

## 2017-10-03 DIAGNOSIS — Z992 Dependence on renal dialysis: Secondary | ICD-10-CM | POA: Diagnosis not present

## 2017-10-04 DIAGNOSIS — N186 End stage renal disease: Secondary | ICD-10-CM | POA: Diagnosis not present

## 2017-10-04 DIAGNOSIS — D631 Anemia in chronic kidney disease: Secondary | ICD-10-CM | POA: Diagnosis not present

## 2017-10-04 DIAGNOSIS — D509 Iron deficiency anemia, unspecified: Secondary | ICD-10-CM | POA: Diagnosis not present

## 2017-10-04 DIAGNOSIS — Z992 Dependence on renal dialysis: Secondary | ICD-10-CM | POA: Diagnosis not present

## 2017-10-04 DIAGNOSIS — N2581 Secondary hyperparathyroidism of renal origin: Secondary | ICD-10-CM | POA: Diagnosis not present

## 2017-10-05 DIAGNOSIS — D631 Anemia in chronic kidney disease: Secondary | ICD-10-CM | POA: Diagnosis not present

## 2017-10-05 DIAGNOSIS — N186 End stage renal disease: Secondary | ICD-10-CM | POA: Diagnosis not present

## 2017-10-05 DIAGNOSIS — D509 Iron deficiency anemia, unspecified: Secondary | ICD-10-CM | POA: Diagnosis not present

## 2017-10-05 DIAGNOSIS — N2581 Secondary hyperparathyroidism of renal origin: Secondary | ICD-10-CM | POA: Diagnosis not present

## 2017-10-05 DIAGNOSIS — Z992 Dependence on renal dialysis: Secondary | ICD-10-CM | POA: Diagnosis not present

## 2017-10-06 DIAGNOSIS — D509 Iron deficiency anemia, unspecified: Secondary | ICD-10-CM | POA: Diagnosis not present

## 2017-10-06 DIAGNOSIS — D631 Anemia in chronic kidney disease: Secondary | ICD-10-CM | POA: Diagnosis not present

## 2017-10-06 DIAGNOSIS — N186 End stage renal disease: Secondary | ICD-10-CM | POA: Diagnosis not present

## 2017-10-06 DIAGNOSIS — Z992 Dependence on renal dialysis: Secondary | ICD-10-CM | POA: Diagnosis not present

## 2017-10-06 DIAGNOSIS — N2581 Secondary hyperparathyroidism of renal origin: Secondary | ICD-10-CM | POA: Diagnosis not present

## 2017-10-07 DIAGNOSIS — D631 Anemia in chronic kidney disease: Secondary | ICD-10-CM | POA: Diagnosis not present

## 2017-10-07 DIAGNOSIS — D509 Iron deficiency anemia, unspecified: Secondary | ICD-10-CM | POA: Diagnosis not present

## 2017-10-07 DIAGNOSIS — N186 End stage renal disease: Secondary | ICD-10-CM | POA: Diagnosis not present

## 2017-10-07 DIAGNOSIS — N2581 Secondary hyperparathyroidism of renal origin: Secondary | ICD-10-CM | POA: Diagnosis not present

## 2017-10-07 DIAGNOSIS — Z992 Dependence on renal dialysis: Secondary | ICD-10-CM | POA: Diagnosis not present

## 2017-10-08 DIAGNOSIS — N186 End stage renal disease: Secondary | ICD-10-CM | POA: Diagnosis not present

## 2017-10-08 DIAGNOSIS — N2581 Secondary hyperparathyroidism of renal origin: Secondary | ICD-10-CM | POA: Diagnosis not present

## 2017-10-08 DIAGNOSIS — D509 Iron deficiency anemia, unspecified: Secondary | ICD-10-CM | POA: Diagnosis not present

## 2017-10-08 DIAGNOSIS — Z992 Dependence on renal dialysis: Secondary | ICD-10-CM | POA: Diagnosis not present

## 2017-10-08 DIAGNOSIS — D631 Anemia in chronic kidney disease: Secondary | ICD-10-CM | POA: Diagnosis not present

## 2017-10-09 DIAGNOSIS — D509 Iron deficiency anemia, unspecified: Secondary | ICD-10-CM | POA: Diagnosis not present

## 2017-10-09 DIAGNOSIS — N2581 Secondary hyperparathyroidism of renal origin: Secondary | ICD-10-CM | POA: Diagnosis not present

## 2017-10-09 DIAGNOSIS — D631 Anemia in chronic kidney disease: Secondary | ICD-10-CM | POA: Diagnosis not present

## 2017-10-09 DIAGNOSIS — N186 End stage renal disease: Secondary | ICD-10-CM | POA: Diagnosis not present

## 2017-10-09 DIAGNOSIS — Z992 Dependence on renal dialysis: Secondary | ICD-10-CM | POA: Diagnosis not present

## 2017-10-10 DIAGNOSIS — D509 Iron deficiency anemia, unspecified: Secondary | ICD-10-CM | POA: Diagnosis not present

## 2017-10-10 DIAGNOSIS — N186 End stage renal disease: Secondary | ICD-10-CM | POA: Diagnosis not present

## 2017-10-10 DIAGNOSIS — N2581 Secondary hyperparathyroidism of renal origin: Secondary | ICD-10-CM | POA: Diagnosis not present

## 2017-10-10 DIAGNOSIS — D631 Anemia in chronic kidney disease: Secondary | ICD-10-CM | POA: Diagnosis not present

## 2017-10-10 DIAGNOSIS — Z992 Dependence on renal dialysis: Secondary | ICD-10-CM | POA: Diagnosis not present

## 2017-10-11 DIAGNOSIS — N186 End stage renal disease: Secondary | ICD-10-CM | POA: Diagnosis not present

## 2017-10-11 DIAGNOSIS — N2581 Secondary hyperparathyroidism of renal origin: Secondary | ICD-10-CM | POA: Diagnosis not present

## 2017-10-11 DIAGNOSIS — D631 Anemia in chronic kidney disease: Secondary | ICD-10-CM | POA: Diagnosis not present

## 2017-10-11 DIAGNOSIS — Z992 Dependence on renal dialysis: Secondary | ICD-10-CM | POA: Diagnosis not present

## 2017-10-11 DIAGNOSIS — D509 Iron deficiency anemia, unspecified: Secondary | ICD-10-CM | POA: Diagnosis not present

## 2017-10-12 DIAGNOSIS — D509 Iron deficiency anemia, unspecified: Secondary | ICD-10-CM | POA: Diagnosis not present

## 2017-10-12 DIAGNOSIS — N186 End stage renal disease: Secondary | ICD-10-CM | POA: Diagnosis not present

## 2017-10-12 DIAGNOSIS — D631 Anemia in chronic kidney disease: Secondary | ICD-10-CM | POA: Diagnosis not present

## 2017-10-12 DIAGNOSIS — Z992 Dependence on renal dialysis: Secondary | ICD-10-CM | POA: Diagnosis not present

## 2017-10-13 DIAGNOSIS — D631 Anemia in chronic kidney disease: Secondary | ICD-10-CM | POA: Diagnosis not present

## 2017-10-13 DIAGNOSIS — D509 Iron deficiency anemia, unspecified: Secondary | ICD-10-CM | POA: Diagnosis not present

## 2017-10-13 DIAGNOSIS — Z992 Dependence on renal dialysis: Secondary | ICD-10-CM | POA: Diagnosis not present

## 2017-10-13 DIAGNOSIS — N186 End stage renal disease: Secondary | ICD-10-CM | POA: Diagnosis not present

## 2017-10-14 DIAGNOSIS — D509 Iron deficiency anemia, unspecified: Secondary | ICD-10-CM | POA: Diagnosis not present

## 2017-10-14 DIAGNOSIS — N186 End stage renal disease: Secondary | ICD-10-CM | POA: Diagnosis not present

## 2017-10-14 DIAGNOSIS — Z992 Dependence on renal dialysis: Secondary | ICD-10-CM | POA: Diagnosis not present

## 2017-10-14 DIAGNOSIS — D631 Anemia in chronic kidney disease: Secondary | ICD-10-CM | POA: Diagnosis not present

## 2017-10-15 DIAGNOSIS — D509 Iron deficiency anemia, unspecified: Secondary | ICD-10-CM | POA: Diagnosis not present

## 2017-10-15 DIAGNOSIS — N186 End stage renal disease: Secondary | ICD-10-CM | POA: Diagnosis not present

## 2017-10-15 DIAGNOSIS — Z992 Dependence on renal dialysis: Secondary | ICD-10-CM | POA: Diagnosis not present

## 2017-10-15 DIAGNOSIS — D631 Anemia in chronic kidney disease: Secondary | ICD-10-CM | POA: Diagnosis not present

## 2017-10-16 DIAGNOSIS — N186 End stage renal disease: Secondary | ICD-10-CM | POA: Diagnosis not present

## 2017-10-16 DIAGNOSIS — D509 Iron deficiency anemia, unspecified: Secondary | ICD-10-CM | POA: Diagnosis not present

## 2017-10-16 DIAGNOSIS — D631 Anemia in chronic kidney disease: Secondary | ICD-10-CM | POA: Diagnosis not present

## 2017-10-16 DIAGNOSIS — Z992 Dependence on renal dialysis: Secondary | ICD-10-CM | POA: Diagnosis not present

## 2017-10-17 DIAGNOSIS — D631 Anemia in chronic kidney disease: Secondary | ICD-10-CM | POA: Diagnosis not present

## 2017-10-17 DIAGNOSIS — D509 Iron deficiency anemia, unspecified: Secondary | ICD-10-CM | POA: Diagnosis not present

## 2017-10-17 DIAGNOSIS — Z992 Dependence on renal dialysis: Secondary | ICD-10-CM | POA: Diagnosis not present

## 2017-10-17 DIAGNOSIS — N186 End stage renal disease: Secondary | ICD-10-CM | POA: Diagnosis not present

## 2017-10-18 DIAGNOSIS — Z992 Dependence on renal dialysis: Secondary | ICD-10-CM | POA: Diagnosis not present

## 2017-10-18 DIAGNOSIS — N186 End stage renal disease: Secondary | ICD-10-CM | POA: Diagnosis not present

## 2017-10-18 DIAGNOSIS — D509 Iron deficiency anemia, unspecified: Secondary | ICD-10-CM | POA: Diagnosis not present

## 2017-10-18 DIAGNOSIS — D631 Anemia in chronic kidney disease: Secondary | ICD-10-CM | POA: Diagnosis not present

## 2017-10-19 DIAGNOSIS — D509 Iron deficiency anemia, unspecified: Secondary | ICD-10-CM | POA: Diagnosis not present

## 2017-10-19 DIAGNOSIS — Z992 Dependence on renal dialysis: Secondary | ICD-10-CM | POA: Diagnosis not present

## 2017-10-19 DIAGNOSIS — D631 Anemia in chronic kidney disease: Secondary | ICD-10-CM | POA: Diagnosis not present

## 2017-10-19 DIAGNOSIS — N186 End stage renal disease: Secondary | ICD-10-CM | POA: Diagnosis not present

## 2017-10-20 DIAGNOSIS — D631 Anemia in chronic kidney disease: Secondary | ICD-10-CM | POA: Diagnosis not present

## 2017-10-20 DIAGNOSIS — N186 End stage renal disease: Secondary | ICD-10-CM | POA: Diagnosis not present

## 2017-10-20 DIAGNOSIS — Z992 Dependence on renal dialysis: Secondary | ICD-10-CM | POA: Diagnosis not present

## 2017-10-20 DIAGNOSIS — D509 Iron deficiency anemia, unspecified: Secondary | ICD-10-CM | POA: Diagnosis not present

## 2017-10-21 DIAGNOSIS — Z992 Dependence on renal dialysis: Secondary | ICD-10-CM | POA: Diagnosis not present

## 2017-10-21 DIAGNOSIS — D631 Anemia in chronic kidney disease: Secondary | ICD-10-CM | POA: Diagnosis not present

## 2017-10-21 DIAGNOSIS — D509 Iron deficiency anemia, unspecified: Secondary | ICD-10-CM | POA: Diagnosis not present

## 2017-10-21 DIAGNOSIS — N186 End stage renal disease: Secondary | ICD-10-CM | POA: Diagnosis not present

## 2017-10-22 DIAGNOSIS — Z992 Dependence on renal dialysis: Secondary | ICD-10-CM | POA: Diagnosis not present

## 2017-10-22 DIAGNOSIS — D631 Anemia in chronic kidney disease: Secondary | ICD-10-CM | POA: Diagnosis not present

## 2017-10-22 DIAGNOSIS — N186 End stage renal disease: Secondary | ICD-10-CM | POA: Diagnosis not present

## 2017-10-22 DIAGNOSIS — D509 Iron deficiency anemia, unspecified: Secondary | ICD-10-CM | POA: Diagnosis not present

## 2017-10-23 DIAGNOSIS — N186 End stage renal disease: Secondary | ICD-10-CM | POA: Diagnosis not present

## 2017-10-23 DIAGNOSIS — D631 Anemia in chronic kidney disease: Secondary | ICD-10-CM | POA: Diagnosis not present

## 2017-10-23 DIAGNOSIS — Z992 Dependence on renal dialysis: Secondary | ICD-10-CM | POA: Diagnosis not present

## 2017-10-23 DIAGNOSIS — D509 Iron deficiency anemia, unspecified: Secondary | ICD-10-CM | POA: Diagnosis not present

## 2017-10-24 DIAGNOSIS — D631 Anemia in chronic kidney disease: Secondary | ICD-10-CM | POA: Diagnosis not present

## 2017-10-24 DIAGNOSIS — Z992 Dependence on renal dialysis: Secondary | ICD-10-CM | POA: Diagnosis not present

## 2017-10-24 DIAGNOSIS — N186 End stage renal disease: Secondary | ICD-10-CM | POA: Diagnosis not present

## 2017-10-24 DIAGNOSIS — D509 Iron deficiency anemia, unspecified: Secondary | ICD-10-CM | POA: Diagnosis not present

## 2017-10-25 DIAGNOSIS — N186 End stage renal disease: Secondary | ICD-10-CM | POA: Diagnosis not present

## 2017-10-25 DIAGNOSIS — D509 Iron deficiency anemia, unspecified: Secondary | ICD-10-CM | POA: Diagnosis not present

## 2017-10-25 DIAGNOSIS — D631 Anemia in chronic kidney disease: Secondary | ICD-10-CM | POA: Diagnosis not present

## 2017-10-25 DIAGNOSIS — Z992 Dependence on renal dialysis: Secondary | ICD-10-CM | POA: Diagnosis not present

## 2017-10-26 DIAGNOSIS — Z992 Dependence on renal dialysis: Secondary | ICD-10-CM | POA: Diagnosis not present

## 2017-10-26 DIAGNOSIS — D509 Iron deficiency anemia, unspecified: Secondary | ICD-10-CM | POA: Diagnosis not present

## 2017-10-26 DIAGNOSIS — N186 End stage renal disease: Secondary | ICD-10-CM | POA: Diagnosis not present

## 2017-10-26 DIAGNOSIS — D631 Anemia in chronic kidney disease: Secondary | ICD-10-CM | POA: Diagnosis not present

## 2017-10-27 DIAGNOSIS — Z992 Dependence on renal dialysis: Secondary | ICD-10-CM | POA: Diagnosis not present

## 2017-10-27 DIAGNOSIS — D631 Anemia in chronic kidney disease: Secondary | ICD-10-CM | POA: Diagnosis not present

## 2017-10-27 DIAGNOSIS — N186 End stage renal disease: Secondary | ICD-10-CM | POA: Diagnosis not present

## 2017-10-27 DIAGNOSIS — D509 Iron deficiency anemia, unspecified: Secondary | ICD-10-CM | POA: Diagnosis not present

## 2017-10-28 DIAGNOSIS — Z992 Dependence on renal dialysis: Secondary | ICD-10-CM | POA: Diagnosis not present

## 2017-10-28 DIAGNOSIS — D631 Anemia in chronic kidney disease: Secondary | ICD-10-CM | POA: Diagnosis not present

## 2017-10-28 DIAGNOSIS — D509 Iron deficiency anemia, unspecified: Secondary | ICD-10-CM | POA: Diagnosis not present

## 2017-10-28 DIAGNOSIS — N186 End stage renal disease: Secondary | ICD-10-CM | POA: Diagnosis not present

## 2017-10-29 DIAGNOSIS — D631 Anemia in chronic kidney disease: Secondary | ICD-10-CM | POA: Diagnosis not present

## 2017-10-29 DIAGNOSIS — D509 Iron deficiency anemia, unspecified: Secondary | ICD-10-CM | POA: Diagnosis not present

## 2017-10-29 DIAGNOSIS — Z992 Dependence on renal dialysis: Secondary | ICD-10-CM | POA: Diagnosis not present

## 2017-10-29 DIAGNOSIS — N186 End stage renal disease: Secondary | ICD-10-CM | POA: Diagnosis not present

## 2017-10-30 DIAGNOSIS — D509 Iron deficiency anemia, unspecified: Secondary | ICD-10-CM | POA: Diagnosis not present

## 2017-10-30 DIAGNOSIS — Z992 Dependence on renal dialysis: Secondary | ICD-10-CM | POA: Diagnosis not present

## 2017-10-30 DIAGNOSIS — N186 End stage renal disease: Secondary | ICD-10-CM | POA: Diagnosis not present

## 2017-10-30 DIAGNOSIS — D631 Anemia in chronic kidney disease: Secondary | ICD-10-CM | POA: Diagnosis not present

## 2017-10-31 DIAGNOSIS — Z992 Dependence on renal dialysis: Secondary | ICD-10-CM | POA: Diagnosis not present

## 2017-10-31 DIAGNOSIS — D509 Iron deficiency anemia, unspecified: Secondary | ICD-10-CM | POA: Diagnosis not present

## 2017-10-31 DIAGNOSIS — N186 End stage renal disease: Secondary | ICD-10-CM | POA: Diagnosis not present

## 2017-10-31 DIAGNOSIS — D631 Anemia in chronic kidney disease: Secondary | ICD-10-CM | POA: Diagnosis not present

## 2017-11-01 DIAGNOSIS — Z992 Dependence on renal dialysis: Secondary | ICD-10-CM | POA: Diagnosis not present

## 2017-11-01 DIAGNOSIS — N186 End stage renal disease: Secondary | ICD-10-CM | POA: Diagnosis not present

## 2017-11-01 DIAGNOSIS — D509 Iron deficiency anemia, unspecified: Secondary | ICD-10-CM | POA: Diagnosis not present

## 2017-11-01 DIAGNOSIS — D631 Anemia in chronic kidney disease: Secondary | ICD-10-CM | POA: Diagnosis not present

## 2017-11-02 DIAGNOSIS — D509 Iron deficiency anemia, unspecified: Secondary | ICD-10-CM | POA: Diagnosis not present

## 2017-11-02 DIAGNOSIS — N186 End stage renal disease: Secondary | ICD-10-CM | POA: Diagnosis not present

## 2017-11-02 DIAGNOSIS — D631 Anemia in chronic kidney disease: Secondary | ICD-10-CM | POA: Diagnosis not present

## 2017-11-02 DIAGNOSIS — Z992 Dependence on renal dialysis: Secondary | ICD-10-CM | POA: Diagnosis not present

## 2017-11-03 DIAGNOSIS — N186 End stage renal disease: Secondary | ICD-10-CM | POA: Diagnosis not present

## 2017-11-03 DIAGNOSIS — Z992 Dependence on renal dialysis: Secondary | ICD-10-CM | POA: Diagnosis not present

## 2017-11-03 DIAGNOSIS — D509 Iron deficiency anemia, unspecified: Secondary | ICD-10-CM | POA: Diagnosis not present

## 2017-11-03 DIAGNOSIS — D631 Anemia in chronic kidney disease: Secondary | ICD-10-CM | POA: Diagnosis not present

## 2017-11-04 DIAGNOSIS — D509 Iron deficiency anemia, unspecified: Secondary | ICD-10-CM | POA: Diagnosis not present

## 2017-11-04 DIAGNOSIS — Z992 Dependence on renal dialysis: Secondary | ICD-10-CM | POA: Diagnosis not present

## 2017-11-04 DIAGNOSIS — D631 Anemia in chronic kidney disease: Secondary | ICD-10-CM | POA: Diagnosis not present

## 2017-11-04 DIAGNOSIS — N186 End stage renal disease: Secondary | ICD-10-CM | POA: Diagnosis not present

## 2017-11-05 DIAGNOSIS — Z992 Dependence on renal dialysis: Secondary | ICD-10-CM | POA: Diagnosis not present

## 2017-11-05 DIAGNOSIS — D509 Iron deficiency anemia, unspecified: Secondary | ICD-10-CM | POA: Diagnosis not present

## 2017-11-05 DIAGNOSIS — D631 Anemia in chronic kidney disease: Secondary | ICD-10-CM | POA: Diagnosis not present

## 2017-11-05 DIAGNOSIS — N186 End stage renal disease: Secondary | ICD-10-CM | POA: Diagnosis not present

## 2017-11-06 DIAGNOSIS — D631 Anemia in chronic kidney disease: Secondary | ICD-10-CM | POA: Diagnosis not present

## 2017-11-06 DIAGNOSIS — N186 End stage renal disease: Secondary | ICD-10-CM | POA: Diagnosis not present

## 2017-11-06 DIAGNOSIS — D509 Iron deficiency anemia, unspecified: Secondary | ICD-10-CM | POA: Diagnosis not present

## 2017-11-06 DIAGNOSIS — Z992 Dependence on renal dialysis: Secondary | ICD-10-CM | POA: Diagnosis not present

## 2017-11-07 DIAGNOSIS — N186 End stage renal disease: Secondary | ICD-10-CM | POA: Diagnosis not present

## 2017-11-07 DIAGNOSIS — D509 Iron deficiency anemia, unspecified: Secondary | ICD-10-CM | POA: Diagnosis not present

## 2017-11-07 DIAGNOSIS — D631 Anemia in chronic kidney disease: Secondary | ICD-10-CM | POA: Diagnosis not present

## 2017-11-07 DIAGNOSIS — Z992 Dependence on renal dialysis: Secondary | ICD-10-CM | POA: Diagnosis not present

## 2017-11-08 DIAGNOSIS — Z79899 Other long term (current) drug therapy: Secondary | ICD-10-CM | POA: Diagnosis not present

## 2017-11-08 DIAGNOSIS — D509 Iron deficiency anemia, unspecified: Secondary | ICD-10-CM | POA: Diagnosis not present

## 2017-11-08 DIAGNOSIS — L539 Erythematous condition, unspecified: Secondary | ICD-10-CM | POA: Diagnosis not present

## 2017-11-08 DIAGNOSIS — N186 End stage renal disease: Secondary | ICD-10-CM | POA: Diagnosis not present

## 2017-11-08 DIAGNOSIS — L0231 Cutaneous abscess of buttock: Secondary | ICD-10-CM | POA: Diagnosis not present

## 2017-11-08 DIAGNOSIS — E1122 Type 2 diabetes mellitus with diabetic chronic kidney disease: Secondary | ICD-10-CM | POA: Diagnosis not present

## 2017-11-08 DIAGNOSIS — Z87891 Personal history of nicotine dependence: Secondary | ICD-10-CM | POA: Diagnosis not present

## 2017-11-08 DIAGNOSIS — Z992 Dependence on renal dialysis: Secondary | ICD-10-CM | POA: Diagnosis not present

## 2017-11-08 DIAGNOSIS — R110 Nausea: Secondary | ICD-10-CM | POA: Diagnosis not present

## 2017-11-08 DIAGNOSIS — I129 Hypertensive chronic kidney disease with stage 1 through stage 4 chronic kidney disease, or unspecified chronic kidney disease: Secondary | ICD-10-CM | POA: Diagnosis not present

## 2017-11-08 DIAGNOSIS — D631 Anemia in chronic kidney disease: Secondary | ICD-10-CM | POA: Diagnosis not present

## 2017-11-08 DIAGNOSIS — N184 Chronic kidney disease, stage 4 (severe): Secondary | ICD-10-CM | POA: Diagnosis not present

## 2017-11-09 DIAGNOSIS — D631 Anemia in chronic kidney disease: Secondary | ICD-10-CM | POA: Diagnosis not present

## 2017-11-09 DIAGNOSIS — N186 End stage renal disease: Secondary | ICD-10-CM | POA: Diagnosis not present

## 2017-11-09 DIAGNOSIS — Z992 Dependence on renal dialysis: Secondary | ICD-10-CM | POA: Diagnosis not present

## 2017-11-09 DIAGNOSIS — D509 Iron deficiency anemia, unspecified: Secondary | ICD-10-CM | POA: Diagnosis not present

## 2017-11-10 DIAGNOSIS — N186 End stage renal disease: Secondary | ICD-10-CM | POA: Diagnosis not present

## 2017-11-10 DIAGNOSIS — D631 Anemia in chronic kidney disease: Secondary | ICD-10-CM | POA: Diagnosis not present

## 2017-11-10 DIAGNOSIS — D509 Iron deficiency anemia, unspecified: Secondary | ICD-10-CM | POA: Diagnosis not present

## 2017-11-10 DIAGNOSIS — Z992 Dependence on renal dialysis: Secondary | ICD-10-CM | POA: Diagnosis not present

## 2017-11-11 DIAGNOSIS — N186 End stage renal disease: Secondary | ICD-10-CM | POA: Diagnosis not present

## 2017-11-11 DIAGNOSIS — Z992 Dependence on renal dialysis: Secondary | ICD-10-CM | POA: Diagnosis not present

## 2017-11-11 DIAGNOSIS — D631 Anemia in chronic kidney disease: Secondary | ICD-10-CM | POA: Diagnosis not present

## 2017-11-11 DIAGNOSIS — D509 Iron deficiency anemia, unspecified: Secondary | ICD-10-CM | POA: Diagnosis not present

## 2017-11-12 DIAGNOSIS — N186 End stage renal disease: Secondary | ICD-10-CM | POA: Diagnosis not present

## 2017-11-12 DIAGNOSIS — D509 Iron deficiency anemia, unspecified: Secondary | ICD-10-CM | POA: Diagnosis not present

## 2017-11-12 DIAGNOSIS — Z992 Dependence on renal dialysis: Secondary | ICD-10-CM | POA: Diagnosis not present

## 2017-11-12 DIAGNOSIS — D631 Anemia in chronic kidney disease: Secondary | ICD-10-CM | POA: Diagnosis not present

## 2017-11-13 DIAGNOSIS — D509 Iron deficiency anemia, unspecified: Secondary | ICD-10-CM | POA: Diagnosis not present

## 2017-11-13 DIAGNOSIS — N186 End stage renal disease: Secondary | ICD-10-CM | POA: Diagnosis not present

## 2017-11-13 DIAGNOSIS — D631 Anemia in chronic kidney disease: Secondary | ICD-10-CM | POA: Diagnosis not present

## 2017-11-13 DIAGNOSIS — Z992 Dependence on renal dialysis: Secondary | ICD-10-CM | POA: Diagnosis not present

## 2017-11-14 DIAGNOSIS — D631 Anemia in chronic kidney disease: Secondary | ICD-10-CM | POA: Diagnosis not present

## 2017-11-14 DIAGNOSIS — Z992 Dependence on renal dialysis: Secondary | ICD-10-CM | POA: Diagnosis not present

## 2017-11-14 DIAGNOSIS — D509 Iron deficiency anemia, unspecified: Secondary | ICD-10-CM | POA: Diagnosis not present

## 2017-11-14 DIAGNOSIS — N186 End stage renal disease: Secondary | ICD-10-CM | POA: Diagnosis not present

## 2017-11-15 DIAGNOSIS — Z992 Dependence on renal dialysis: Secondary | ICD-10-CM | POA: Diagnosis not present

## 2017-11-15 DIAGNOSIS — D509 Iron deficiency anemia, unspecified: Secondary | ICD-10-CM | POA: Diagnosis not present

## 2017-11-15 DIAGNOSIS — D631 Anemia in chronic kidney disease: Secondary | ICD-10-CM | POA: Diagnosis not present

## 2017-11-15 DIAGNOSIS — N186 End stage renal disease: Secondary | ICD-10-CM | POA: Diagnosis not present

## 2017-11-16 DIAGNOSIS — Z992 Dependence on renal dialysis: Secondary | ICD-10-CM | POA: Diagnosis not present

## 2017-11-16 DIAGNOSIS — N186 End stage renal disease: Secondary | ICD-10-CM | POA: Diagnosis not present

## 2017-11-16 DIAGNOSIS — D509 Iron deficiency anemia, unspecified: Secondary | ICD-10-CM | POA: Diagnosis not present

## 2017-11-16 DIAGNOSIS — D631 Anemia in chronic kidney disease: Secondary | ICD-10-CM | POA: Diagnosis not present

## 2017-11-17 DIAGNOSIS — Z992 Dependence on renal dialysis: Secondary | ICD-10-CM | POA: Diagnosis not present

## 2017-11-17 DIAGNOSIS — D509 Iron deficiency anemia, unspecified: Secondary | ICD-10-CM | POA: Diagnosis not present

## 2017-11-17 DIAGNOSIS — N186 End stage renal disease: Secondary | ICD-10-CM | POA: Diagnosis not present

## 2017-11-17 DIAGNOSIS — D631 Anemia in chronic kidney disease: Secondary | ICD-10-CM | POA: Diagnosis not present

## 2017-11-18 DIAGNOSIS — D631 Anemia in chronic kidney disease: Secondary | ICD-10-CM | POA: Diagnosis not present

## 2017-11-18 DIAGNOSIS — Z992 Dependence on renal dialysis: Secondary | ICD-10-CM | POA: Diagnosis not present

## 2017-11-18 DIAGNOSIS — N186 End stage renal disease: Secondary | ICD-10-CM | POA: Diagnosis not present

## 2017-11-18 DIAGNOSIS — D509 Iron deficiency anemia, unspecified: Secondary | ICD-10-CM | POA: Diagnosis not present

## 2017-11-19 DIAGNOSIS — D631 Anemia in chronic kidney disease: Secondary | ICD-10-CM | POA: Diagnosis not present

## 2017-11-19 DIAGNOSIS — Z992 Dependence on renal dialysis: Secondary | ICD-10-CM | POA: Diagnosis not present

## 2017-11-19 DIAGNOSIS — N186 End stage renal disease: Secondary | ICD-10-CM | POA: Diagnosis not present

## 2017-11-19 DIAGNOSIS — D509 Iron deficiency anemia, unspecified: Secondary | ICD-10-CM | POA: Diagnosis not present

## 2017-11-20 DIAGNOSIS — R0602 Shortness of breath: Secondary | ICD-10-CM | POA: Diagnosis not present

## 2017-11-20 DIAGNOSIS — Z87891 Personal history of nicotine dependence: Secondary | ICD-10-CM | POA: Diagnosis not present

## 2017-11-20 DIAGNOSIS — N186 End stage renal disease: Secondary | ICD-10-CM | POA: Diagnosis not present

## 2017-11-20 DIAGNOSIS — R05 Cough: Secondary | ICD-10-CM | POA: Diagnosis not present

## 2017-11-20 DIAGNOSIS — K7469 Other cirrhosis of liver: Secondary | ICD-10-CM | POA: Diagnosis not present

## 2017-11-20 DIAGNOSIS — Z9119 Patient's noncompliance with other medical treatment and regimen: Secondary | ICD-10-CM | POA: Diagnosis not present

## 2017-11-20 DIAGNOSIS — E875 Hyperkalemia: Secondary | ICD-10-CM | POA: Diagnosis not present

## 2017-11-20 DIAGNOSIS — J90 Pleural effusion, not elsewhere classified: Secondary | ICD-10-CM | POA: Diagnosis not present

## 2017-11-20 DIAGNOSIS — J9811 Atelectasis: Secondary | ICD-10-CM | POA: Diagnosis not present

## 2017-11-20 DIAGNOSIS — K766 Portal hypertension: Secondary | ICD-10-CM | POA: Diagnosis not present

## 2017-11-20 DIAGNOSIS — Z992 Dependence on renal dialysis: Secondary | ICD-10-CM | POA: Diagnosis not present

## 2017-11-20 DIAGNOSIS — D631 Anemia in chronic kidney disease: Secondary | ICD-10-CM | POA: Diagnosis not present

## 2017-11-20 DIAGNOSIS — D509 Iron deficiency anemia, unspecified: Secondary | ICD-10-CM | POA: Diagnosis not present

## 2017-11-20 DIAGNOSIS — E1122 Type 2 diabetes mellitus with diabetic chronic kidney disease: Secondary | ICD-10-CM | POA: Diagnosis not present

## 2017-11-20 DIAGNOSIS — K8020 Calculus of gallbladder without cholecystitis without obstruction: Secondary | ICD-10-CM | POA: Diagnosis not present

## 2017-11-20 DIAGNOSIS — I120 Hypertensive chronic kidney disease with stage 5 chronic kidney disease or end stage renal disease: Secondary | ICD-10-CM | POA: Diagnosis not present

## 2017-11-20 DIAGNOSIS — R188 Other ascites: Secondary | ICD-10-CM | POA: Diagnosis not present

## 2017-11-20 DIAGNOSIS — J069 Acute upper respiratory infection, unspecified: Secondary | ICD-10-CM | POA: Diagnosis not present

## 2017-11-21 DIAGNOSIS — Z992 Dependence on renal dialysis: Secondary | ICD-10-CM | POA: Diagnosis not present

## 2017-11-21 DIAGNOSIS — D509 Iron deficiency anemia, unspecified: Secondary | ICD-10-CM | POA: Diagnosis not present

## 2017-11-21 DIAGNOSIS — N186 End stage renal disease: Secondary | ICD-10-CM | POA: Diagnosis not present

## 2017-11-21 DIAGNOSIS — D631 Anemia in chronic kidney disease: Secondary | ICD-10-CM | POA: Diagnosis not present

## 2017-11-22 DIAGNOSIS — Z992 Dependence on renal dialysis: Secondary | ICD-10-CM | POA: Diagnosis not present

## 2017-11-22 DIAGNOSIS — D509 Iron deficiency anemia, unspecified: Secondary | ICD-10-CM | POA: Diagnosis not present

## 2017-11-22 DIAGNOSIS — D631 Anemia in chronic kidney disease: Secondary | ICD-10-CM | POA: Diagnosis not present

## 2017-11-22 DIAGNOSIS — N186 End stage renal disease: Secondary | ICD-10-CM | POA: Diagnosis not present

## 2017-11-23 DIAGNOSIS — N186 End stage renal disease: Secondary | ICD-10-CM | POA: Diagnosis not present

## 2017-11-23 DIAGNOSIS — D631 Anemia in chronic kidney disease: Secondary | ICD-10-CM | POA: Diagnosis not present

## 2017-11-23 DIAGNOSIS — D509 Iron deficiency anemia, unspecified: Secondary | ICD-10-CM | POA: Diagnosis not present

## 2017-11-23 DIAGNOSIS — Z992 Dependence on renal dialysis: Secondary | ICD-10-CM | POA: Diagnosis not present

## 2017-11-24 DIAGNOSIS — Z992 Dependence on renal dialysis: Secondary | ICD-10-CM | POA: Diagnosis not present

## 2017-11-24 DIAGNOSIS — D509 Iron deficiency anemia, unspecified: Secondary | ICD-10-CM | POA: Diagnosis not present

## 2017-11-24 DIAGNOSIS — D631 Anemia in chronic kidney disease: Secondary | ICD-10-CM | POA: Diagnosis not present

## 2017-11-24 DIAGNOSIS — N186 End stage renal disease: Secondary | ICD-10-CM | POA: Diagnosis not present

## 2017-11-25 DIAGNOSIS — Z992 Dependence on renal dialysis: Secondary | ICD-10-CM | POA: Diagnosis not present

## 2017-11-25 DIAGNOSIS — N186 End stage renal disease: Secondary | ICD-10-CM | POA: Diagnosis not present

## 2017-11-25 DIAGNOSIS — D509 Iron deficiency anemia, unspecified: Secondary | ICD-10-CM | POA: Diagnosis not present

## 2017-11-25 DIAGNOSIS — D631 Anemia in chronic kidney disease: Secondary | ICD-10-CM | POA: Diagnosis not present

## 2017-11-26 DIAGNOSIS — D509 Iron deficiency anemia, unspecified: Secondary | ICD-10-CM | POA: Diagnosis not present

## 2017-11-26 DIAGNOSIS — Z992 Dependence on renal dialysis: Secondary | ICD-10-CM | POA: Diagnosis not present

## 2017-11-26 DIAGNOSIS — D631 Anemia in chronic kidney disease: Secondary | ICD-10-CM | POA: Diagnosis not present

## 2017-11-26 DIAGNOSIS — N186 End stage renal disease: Secondary | ICD-10-CM | POA: Diagnosis not present

## 2017-11-27 DIAGNOSIS — N186 End stage renal disease: Secondary | ICD-10-CM | POA: Diagnosis not present

## 2017-11-27 DIAGNOSIS — D631 Anemia in chronic kidney disease: Secondary | ICD-10-CM | POA: Diagnosis not present

## 2017-11-27 DIAGNOSIS — Z992 Dependence on renal dialysis: Secondary | ICD-10-CM | POA: Diagnosis not present

## 2017-11-27 DIAGNOSIS — D509 Iron deficiency anemia, unspecified: Secondary | ICD-10-CM | POA: Diagnosis not present

## 2017-11-28 DIAGNOSIS — N186 End stage renal disease: Secondary | ICD-10-CM | POA: Diagnosis not present

## 2017-11-28 DIAGNOSIS — D509 Iron deficiency anemia, unspecified: Secondary | ICD-10-CM | POA: Diagnosis not present

## 2017-11-28 DIAGNOSIS — Z992 Dependence on renal dialysis: Secondary | ICD-10-CM | POA: Diagnosis not present

## 2017-11-28 DIAGNOSIS — D631 Anemia in chronic kidney disease: Secondary | ICD-10-CM | POA: Diagnosis not present

## 2017-11-29 ENCOUNTER — Ambulatory Visit (INDEPENDENT_AMBULATORY_CARE_PROVIDER_SITE_OTHER): Payer: Medicare Other | Admitting: Family Medicine

## 2017-11-29 ENCOUNTER — Encounter: Payer: Self-pay | Admitting: Family Medicine

## 2017-11-29 VITALS — BP 138/80 | HR 100 | Temp 98.7°F | Ht 69.0 in | Wt 273.0 lb

## 2017-11-29 DIAGNOSIS — J0100 Acute maxillary sinusitis, unspecified: Secondary | ICD-10-CM | POA: Diagnosis not present

## 2017-11-29 DIAGNOSIS — N186 End stage renal disease: Secondary | ICD-10-CM | POA: Diagnosis not present

## 2017-11-29 DIAGNOSIS — D631 Anemia in chronic kidney disease: Secondary | ICD-10-CM | POA: Diagnosis not present

## 2017-11-29 DIAGNOSIS — J40 Bronchitis, not specified as acute or chronic: Secondary | ICD-10-CM | POA: Diagnosis not present

## 2017-11-29 DIAGNOSIS — Z992 Dependence on renal dialysis: Secondary | ICD-10-CM | POA: Diagnosis not present

## 2017-11-29 DIAGNOSIS — D509 Iron deficiency anemia, unspecified: Secondary | ICD-10-CM | POA: Diagnosis not present

## 2017-11-29 MED ORDER — AMOXICILLIN-POT CLAVULANATE 875-125 MG PO TABS: 1.0000 | ORAL_TABLET | Freq: Two times a day (BID) | ORAL | 0 refills | Status: DC

## 2017-11-29 MED ORDER — GUAIFENESIN-CODEINE 100-10 MG/5ML PO SYRP: 5.0000 mL | ORAL_SOLUTION | Freq: Three times a day (TID) | ORAL | 0 refills | Status: DC | PRN

## 2017-11-29 MED ORDER — BENZONATATE 100 MG PO CAPS: 100.0000 mg | ORAL_CAPSULE | Freq: Two times a day (BID) | ORAL | 0 refills | Status: DC | PRN

## 2017-11-29 NOTE — Progress Notes (Signed)
Name: Jake Gomez   MRN: [id number]    DOB: April 08, 1971   Date:11/29/2017       Progress Note  Subjective  Chief Complaint  Chief Complaint  Patient presents with  . URI    went to ER on 6/9 with sore throat/ cough. Gave him codeine cough syrup. Cough was better, but then was hoarse and getting phlegm up. has to clear throat- sore again    Sinusitis  This is a new problem. The current episode started in the past 7 days. The problem has been gradually worsening since onset. There has been no fever. His pain is at a severity of 3/10. The pain is mild. Associated symptoms include chills, congestion, coughing, diaphoresis, headaches, a hoarse voice, neck pain, shortness of breath and sinus pressure. Pertinent negatives include no ear pain, sneezing, sore throat or swollen glands. The treatment provided moderate relief.  Sore Throat   This is a new problem. The current episode started in the past 7 days. The problem has been unchanged. Neither side of throat is experiencing more pain than the other. Associated symptoms include congestion, coughing, headaches, a hoarse voice, neck pain and shortness of breath. Pertinent negatives include no abdominal pain, diarrhea, drooling, ear discharge, ear pain or swollen glands. Associated symptoms comments: Yellow/green postnasal drainage. He has had exposure to mono. He has had no exposure to strep. He has tried acetaminophen and gargles for the symptoms. The treatment provided mild relief.    No problem-specific Assessment & Plan notes found for this encounter.   Past Medical History:  Diagnosis Date  . Diabetes mellitus without complication ([place])   . Hypertension   . Renal disorder     Past Surgical History:  Procedure Laterality Date  . peritonal dialysis      No family history on file.  Social History   Socioeconomic History  . Marital status: Single    Spouse name: Not on file  . Number of children: Not on file  . Years of education:  Not on file  . Highest education level: Not on file  Occupational History  . Not on file  Social Needs  . Financial resource strain: Not on file  . Food insecurity:    Worry: Not on file    Inability: Not on file  . Transportation needs:    Medical: Not on file    Non-medical: Not on file  Tobacco Use  . Smoking status: Former Research scientist (life sciences)  . Smokeless tobacco: Former Systems developer    Quit date: 07/16/2009  Substance and Sexual Activity  . Alcohol use: No    Frequency: Never  . Drug use: No  . Sexual activity: Not on file  Lifestyle  . Physical activity:    Days per week: Not on file    Minutes per session: Not on file  . Stress: Not on file  Relationships  . Social connections:    Talks on phone: Not on file    Gets together: Not on file    Attends religious service: Not on file    Active member of club or organization: Not on file    Attends meetings of clubs or organizations: Not on file    Relationship status: Not on file  . Intimate partner violence:    Fear of current or ex partner: Not on file    Emotionally abused: Not on file    Physically abused: Not on file    Forced sexual activity: Not on file  Other Topics Concern  .  Not on file  Social History Narrative  . Not on file    No Known Allergies  Outpatient Medications Prior to Visit  Medication Sig Dispense Refill  . acetaminophen (TYLENOL) 500 MG tablet Take 500 mg by mouth.    Marland Kitchen amLODipine (NORVASC) 5 MG tablet Take 10 mg by mouth 2 (two) times daily.     Marland Kitchen atorvastatin (LIPITOR) 80 MG tablet Take 80 mg by mouth.    . bumetanide (BUMEX) 2 MG tablet Take 2 mg by mouth daily.     . calcium acetate (PHOSLO) 667 MG capsule Take 2,001 mg by mouth 3 (three) times daily with meals.     . irbesartan (AVAPRO) 300 MG tablet Take 300 mg by mouth at bedtime.     Marland Kitchen labetalol (NORMODYNE) 200 MG tablet Take 200 mg by mouth 2 (two) times daily.     . Vitamin D, Ergocalciferol, (DRISDOL) 50000 units CAPS capsule Take 50,000 Units by  mouth every 7 (seven) days.     Marland Kitchen doxycycline (VIBRA-TABS) 100 MG tablet Take 100 mg by mouth daily.     No facility-administered medications prior to visit.     Review of Systems  Constitutional: Positive for chills and diaphoresis. Negative for fever, malaise/fatigue and weight loss.  HENT: Positive for congestion, hoarse voice and sinus pressure. Negative for drooling, ear discharge, ear pain, sneezing and sore throat.   Eyes: Negative for blurred vision.  Respiratory: Positive for cough and shortness of breath. Negative for sputum production and wheezing.   Cardiovascular: Negative for chest pain, palpitations and leg swelling.  Gastrointestinal: Negative for abdominal pain, blood in stool, constipation, diarrhea, heartburn, melena and nausea.  Genitourinary: Negative for dysuria, frequency, hematuria and urgency.  Musculoskeletal: Positive for neck pain. Negative for back pain, joint pain and myalgias.  Skin: Negative for rash.  Neurological: Positive for headaches. Negative for dizziness, tingling, sensory change and focal weakness.  Endo/Heme/Allergies: Negative for environmental allergies and polydipsia. Does not bruise/bleed easily.  Psychiatric/Behavioral: Negative for depression and suicidal ideas. The patient is not nervous/anxious and does not have insomnia.      Objective  Vitals:   11/29/17 1623  BP: 138/80  Pulse: 100  Temp: 98.7 F (37.1 C)  TempSrc: Oral  Weight: 273 lb (123.8 kg)  Height: 5\' 9"  (1.753 m)    Physical Exam  Constitutional: He is oriented to person, place, and time.  HENT:  Head: Normocephalic.  Right Ear: Hearing, tympanic membrane, external ear and ear canal normal. No decreased hearing is noted.  Left Ear: Hearing, tympanic membrane, external ear and ear canal normal. No decreased hearing is noted.  Nose: Right sinus exhibits maxillary sinus tenderness. Right sinus exhibits no frontal sinus tenderness. Left sinus exhibits maxillary sinus  tenderness. Left sinus exhibits no frontal sinus tenderness.  Mouth/Throat: Uvula is midline, oropharynx is clear and moist and mucous membranes are normal. No oropharyngeal exudate, posterior oropharyngeal edema or posterior oropharyngeal erythema.  Eyes: Pupils are equal, round, and reactive to light. Conjunctivae and EOM are normal. Right eye exhibits no discharge. Left eye exhibits no discharge. No scleral icterus.  Neck: Normal range of motion. Neck supple. Normal carotid pulses, no hepatojugular reflux and no JVD present. Carotid bruit is not present. No tracheal deviation present. No thyromegaly present.  Cardiovascular: Normal rate, regular rhythm, normal heart sounds and intact distal pulses. Exam reveals no gallop and no friction rub.  No murmur heard. Pulmonary/Chest: Breath sounds normal. No respiratory distress. He has no wheezes. He  has no rales.  Abdominal: Soft. Bowel sounds are normal. He exhibits no mass. There is no hepatosplenomegaly. There is no tenderness. There is no rebound, no guarding and no CVA tenderness.  Musculoskeletal: Normal range of motion. He exhibits no edema or tenderness.  Lymphadenopathy:       Head (right side): No submandibular adenopathy present.       Head (left side): No submandibular adenopathy present.    He has no cervical adenopathy.  Neurological: He is alert and oriented to person, place, and time. He has normal strength and normal reflexes. No cranial nerve deficit.  Skin: Skin is warm. No rash noted.  Nursing note and vitals reviewed.     Assessment & Plan  Problem List Items Addressed This Visit    None    Visit Diagnoses    Acute maxillary sinusitis, recurrence not specified    -  Primary   Patient with persistent purulent sinus drainage. Will treat with augmentin 875 bid for 10 day. Robitussin refill and tessalon perles for persitent cough.   Relevant Medications   amoxicillin-clavulanate (AUGMENTIN) 875-125 MG tablet    guaiFENesin-codeine (ROBITUSSIN AC) 100-10 MG/5ML syrup   benzonatate (TESSALON) 100 MG capsule   Bronchitis       Cough persist after Vertussin only. Likely postnasal/will refill with robitussin AC.   Relevant Medications   guaiFENesin-codeine (ROBITUSSIN AC) 100-10 MG/5ML syrup   benzonatate (TESSALON) 100 MG capsule      Meds ordered this encounter  Medications  . amoxicillin-clavulanate (AUGMENTIN) 875-125 MG tablet    Sig: Take 1 tablet by mouth 2 (two) times daily.    Dispense:  20 tablet    Refill:  0  . guaiFENesin-codeine (ROBITUSSIN AC) 100-10 MG/5ML syrup    Sig: Take 5 mLs by mouth 3 (three) times daily as needed for cough.    Dispense:  150 mL    Refill:  0  . benzonatate (TESSALON) 100 MG capsule    Sig: Take 1 capsule (100 mg total) by mouth 2 (two) times daily as needed for cough.    Dispense:  20 capsule    Refill:  0      Dr. Otilio Miu [facility]  11/29/17

## 2017-11-30 DIAGNOSIS — D509 Iron deficiency anemia, unspecified: Secondary | ICD-10-CM | POA: Diagnosis not present

## 2017-11-30 DIAGNOSIS — N186 End stage renal disease: Secondary | ICD-10-CM | POA: Diagnosis not present

## 2017-11-30 DIAGNOSIS — Z992 Dependence on renal dialysis: Secondary | ICD-10-CM | POA: Diagnosis not present

## 2017-11-30 DIAGNOSIS — D631 Anemia in chronic kidney disease: Secondary | ICD-10-CM | POA: Diagnosis not present

## 2017-12-01 DIAGNOSIS — N186 End stage renal disease: Secondary | ICD-10-CM | POA: Diagnosis not present

## 2017-12-01 DIAGNOSIS — D509 Iron deficiency anemia, unspecified: Secondary | ICD-10-CM | POA: Diagnosis not present

## 2017-12-01 DIAGNOSIS — D631 Anemia in chronic kidney disease: Secondary | ICD-10-CM | POA: Diagnosis not present

## 2017-12-01 DIAGNOSIS — Z992 Dependence on renal dialysis: Secondary | ICD-10-CM | POA: Diagnosis not present

## 2017-12-02 DIAGNOSIS — N186 End stage renal disease: Secondary | ICD-10-CM | POA: Diagnosis not present

## 2017-12-02 DIAGNOSIS — D631 Anemia in chronic kidney disease: Secondary | ICD-10-CM | POA: Diagnosis not present

## 2017-12-02 DIAGNOSIS — D509 Iron deficiency anemia, unspecified: Secondary | ICD-10-CM | POA: Diagnosis not present

## 2017-12-02 DIAGNOSIS — Z992 Dependence on renal dialysis: Secondary | ICD-10-CM | POA: Diagnosis not present

## 2017-12-03 DIAGNOSIS — D631 Anemia in chronic kidney disease: Secondary | ICD-10-CM | POA: Diagnosis not present

## 2017-12-03 DIAGNOSIS — D509 Iron deficiency anemia, unspecified: Secondary | ICD-10-CM | POA: Diagnosis not present

## 2017-12-03 DIAGNOSIS — N186 End stage renal disease: Secondary | ICD-10-CM | POA: Diagnosis not present

## 2017-12-03 DIAGNOSIS — Z992 Dependence on renal dialysis: Secondary | ICD-10-CM | POA: Diagnosis not present

## 2017-12-04 DIAGNOSIS — Z992 Dependence on renal dialysis: Secondary | ICD-10-CM | POA: Diagnosis not present

## 2017-12-04 DIAGNOSIS — D509 Iron deficiency anemia, unspecified: Secondary | ICD-10-CM | POA: Diagnosis not present

## 2017-12-04 DIAGNOSIS — D631 Anemia in chronic kidney disease: Secondary | ICD-10-CM | POA: Diagnosis not present

## 2017-12-04 DIAGNOSIS — N186 End stage renal disease: Secondary | ICD-10-CM | POA: Diagnosis not present

## 2017-12-05 DIAGNOSIS — D509 Iron deficiency anemia, unspecified: Secondary | ICD-10-CM | POA: Diagnosis not present

## 2017-12-05 DIAGNOSIS — Z992 Dependence on renal dialysis: Secondary | ICD-10-CM | POA: Diagnosis not present

## 2017-12-05 DIAGNOSIS — N186 End stage renal disease: Secondary | ICD-10-CM | POA: Diagnosis not present

## 2017-12-05 DIAGNOSIS — D631 Anemia in chronic kidney disease: Secondary | ICD-10-CM | POA: Diagnosis not present

## 2017-12-06 DIAGNOSIS — D509 Iron deficiency anemia, unspecified: Secondary | ICD-10-CM | POA: Diagnosis not present

## 2017-12-06 DIAGNOSIS — Z992 Dependence on renal dialysis: Secondary | ICD-10-CM | POA: Diagnosis not present

## 2017-12-06 DIAGNOSIS — N186 End stage renal disease: Secondary | ICD-10-CM | POA: Diagnosis not present

## 2017-12-06 DIAGNOSIS — D631 Anemia in chronic kidney disease: Secondary | ICD-10-CM | POA: Diagnosis not present

## 2017-12-07 DIAGNOSIS — N186 End stage renal disease: Secondary | ICD-10-CM | POA: Diagnosis not present

## 2017-12-07 DIAGNOSIS — D631 Anemia in chronic kidney disease: Secondary | ICD-10-CM | POA: Diagnosis not present

## 2017-12-07 DIAGNOSIS — Z992 Dependence on renal dialysis: Secondary | ICD-10-CM | POA: Diagnosis not present

## 2017-12-07 DIAGNOSIS — D509 Iron deficiency anemia, unspecified: Secondary | ICD-10-CM | POA: Diagnosis not present

## 2017-12-08 DIAGNOSIS — Z992 Dependence on renal dialysis: Secondary | ICD-10-CM | POA: Diagnosis not present

## 2017-12-08 DIAGNOSIS — D631 Anemia in chronic kidney disease: Secondary | ICD-10-CM | POA: Diagnosis not present

## 2017-12-08 DIAGNOSIS — D509 Iron deficiency anemia, unspecified: Secondary | ICD-10-CM | POA: Diagnosis not present

## 2017-12-08 DIAGNOSIS — N186 End stage renal disease: Secondary | ICD-10-CM | POA: Diagnosis not present

## 2017-12-09 DIAGNOSIS — N186 End stage renal disease: Secondary | ICD-10-CM | POA: Diagnosis not present

## 2017-12-09 DIAGNOSIS — Z992 Dependence on renal dialysis: Secondary | ICD-10-CM | POA: Diagnosis not present

## 2017-12-09 DIAGNOSIS — D631 Anemia in chronic kidney disease: Secondary | ICD-10-CM | POA: Diagnosis not present

## 2017-12-09 DIAGNOSIS — D509 Iron deficiency anemia, unspecified: Secondary | ICD-10-CM | POA: Diagnosis not present

## 2017-12-10 DIAGNOSIS — N186 End stage renal disease: Secondary | ICD-10-CM | POA: Diagnosis not present

## 2017-12-10 DIAGNOSIS — D509 Iron deficiency anemia, unspecified: Secondary | ICD-10-CM | POA: Diagnosis not present

## 2017-12-10 DIAGNOSIS — Z992 Dependence on renal dialysis: Secondary | ICD-10-CM | POA: Diagnosis not present

## 2017-12-10 DIAGNOSIS — D631 Anemia in chronic kidney disease: Secondary | ICD-10-CM | POA: Diagnosis not present

## 2017-12-11 DIAGNOSIS — Z992 Dependence on renal dialysis: Secondary | ICD-10-CM | POA: Diagnosis not present

## 2017-12-11 DIAGNOSIS — D631 Anemia in chronic kidney disease: Secondary | ICD-10-CM | POA: Diagnosis not present

## 2017-12-11 DIAGNOSIS — D509 Iron deficiency anemia, unspecified: Secondary | ICD-10-CM | POA: Diagnosis not present

## 2017-12-11 DIAGNOSIS — N186 End stage renal disease: Secondary | ICD-10-CM | POA: Diagnosis not present

## 2017-12-12 DIAGNOSIS — N186 End stage renal disease: Secondary | ICD-10-CM | POA: Diagnosis not present

## 2017-12-12 DIAGNOSIS — Z992 Dependence on renal dialysis: Secondary | ICD-10-CM | POA: Diagnosis not present

## 2017-12-12 DIAGNOSIS — D631 Anemia in chronic kidney disease: Secondary | ICD-10-CM | POA: Diagnosis not present

## 2017-12-12 DIAGNOSIS — D509 Iron deficiency anemia, unspecified: Secondary | ICD-10-CM | POA: Diagnosis not present

## 2017-12-12 DIAGNOSIS — N2581 Secondary hyperparathyroidism of renal origin: Secondary | ICD-10-CM | POA: Diagnosis not present

## 2017-12-13 DIAGNOSIS — D631 Anemia in chronic kidney disease: Secondary | ICD-10-CM | POA: Diagnosis not present

## 2017-12-13 DIAGNOSIS — N2581 Secondary hyperparathyroidism of renal origin: Secondary | ICD-10-CM | POA: Diagnosis not present

## 2017-12-13 DIAGNOSIS — N186 End stage renal disease: Secondary | ICD-10-CM | POA: Diagnosis not present

## 2017-12-13 DIAGNOSIS — D509 Iron deficiency anemia, unspecified: Secondary | ICD-10-CM | POA: Diagnosis not present

## 2017-12-13 DIAGNOSIS — Z992 Dependence on renal dialysis: Secondary | ICD-10-CM | POA: Diagnosis not present

## 2017-12-14 DIAGNOSIS — D509 Iron deficiency anemia, unspecified: Secondary | ICD-10-CM | POA: Diagnosis not present

## 2017-12-14 DIAGNOSIS — D631 Anemia in chronic kidney disease: Secondary | ICD-10-CM | POA: Diagnosis not present

## 2017-12-14 DIAGNOSIS — Z992 Dependence on renal dialysis: Secondary | ICD-10-CM | POA: Diagnosis not present

## 2017-12-14 DIAGNOSIS — N186 End stage renal disease: Secondary | ICD-10-CM | POA: Diagnosis not present

## 2017-12-14 DIAGNOSIS — N2581 Secondary hyperparathyroidism of renal origin: Secondary | ICD-10-CM | POA: Diagnosis not present

## 2017-12-15 DIAGNOSIS — N2581 Secondary hyperparathyroidism of renal origin: Secondary | ICD-10-CM | POA: Diagnosis not present

## 2017-12-15 DIAGNOSIS — D509 Iron deficiency anemia, unspecified: Secondary | ICD-10-CM | POA: Diagnosis not present

## 2017-12-15 DIAGNOSIS — N186 End stage renal disease: Secondary | ICD-10-CM | POA: Diagnosis not present

## 2017-12-15 DIAGNOSIS — D631 Anemia in chronic kidney disease: Secondary | ICD-10-CM | POA: Diagnosis not present

## 2017-12-15 DIAGNOSIS — Z992 Dependence on renal dialysis: Secondary | ICD-10-CM | POA: Diagnosis not present

## 2017-12-16 DIAGNOSIS — Z992 Dependence on renal dialysis: Secondary | ICD-10-CM | POA: Diagnosis not present

## 2017-12-16 DIAGNOSIS — D509 Iron deficiency anemia, unspecified: Secondary | ICD-10-CM | POA: Diagnosis not present

## 2017-12-16 DIAGNOSIS — N186 End stage renal disease: Secondary | ICD-10-CM | POA: Diagnosis not present

## 2017-12-16 DIAGNOSIS — D631 Anemia in chronic kidney disease: Secondary | ICD-10-CM | POA: Diagnosis not present

## 2017-12-16 DIAGNOSIS — N2581 Secondary hyperparathyroidism of renal origin: Secondary | ICD-10-CM | POA: Diagnosis not present

## 2017-12-17 DIAGNOSIS — Z992 Dependence on renal dialysis: Secondary | ICD-10-CM | POA: Diagnosis not present

## 2017-12-17 DIAGNOSIS — N186 End stage renal disease: Secondary | ICD-10-CM | POA: Diagnosis not present

## 2017-12-17 DIAGNOSIS — N2581 Secondary hyperparathyroidism of renal origin: Secondary | ICD-10-CM | POA: Diagnosis not present

## 2017-12-17 DIAGNOSIS — D631 Anemia in chronic kidney disease: Secondary | ICD-10-CM | POA: Diagnosis not present

## 2017-12-17 DIAGNOSIS — D509 Iron deficiency anemia, unspecified: Secondary | ICD-10-CM | POA: Diagnosis not present

## 2017-12-18 DIAGNOSIS — N186 End stage renal disease: Secondary | ICD-10-CM | POA: Diagnosis not present

## 2017-12-18 DIAGNOSIS — Z992 Dependence on renal dialysis: Secondary | ICD-10-CM | POA: Diagnosis not present

## 2017-12-18 DIAGNOSIS — D509 Iron deficiency anemia, unspecified: Secondary | ICD-10-CM | POA: Diagnosis not present

## 2017-12-18 DIAGNOSIS — D631 Anemia in chronic kidney disease: Secondary | ICD-10-CM | POA: Diagnosis not present

## 2017-12-18 DIAGNOSIS — N2581 Secondary hyperparathyroidism of renal origin: Secondary | ICD-10-CM | POA: Diagnosis not present

## 2017-12-19 DIAGNOSIS — D509 Iron deficiency anemia, unspecified: Secondary | ICD-10-CM | POA: Diagnosis not present

## 2017-12-19 DIAGNOSIS — Z992 Dependence on renal dialysis: Secondary | ICD-10-CM | POA: Diagnosis not present

## 2017-12-19 DIAGNOSIS — L298 Other pruritus: Secondary | ICD-10-CM | POA: Diagnosis not present

## 2017-12-19 DIAGNOSIS — N2581 Secondary hyperparathyroidism of renal origin: Secondary | ICD-10-CM | POA: Diagnosis not present

## 2017-12-19 DIAGNOSIS — D631 Anemia in chronic kidney disease: Secondary | ICD-10-CM | POA: Diagnosis not present

## 2017-12-19 DIAGNOSIS — N186 End stage renal disease: Secondary | ICD-10-CM | POA: Diagnosis not present

## 2017-12-20 DIAGNOSIS — Z992 Dependence on renal dialysis: Secondary | ICD-10-CM | POA: Diagnosis not present

## 2017-12-20 DIAGNOSIS — N186 End stage renal disease: Secondary | ICD-10-CM | POA: Diagnosis not present

## 2017-12-20 DIAGNOSIS — D509 Iron deficiency anemia, unspecified: Secondary | ICD-10-CM | POA: Diagnosis not present

## 2017-12-20 DIAGNOSIS — D631 Anemia in chronic kidney disease: Secondary | ICD-10-CM | POA: Diagnosis not present

## 2017-12-20 DIAGNOSIS — N2581 Secondary hyperparathyroidism of renal origin: Secondary | ICD-10-CM | POA: Diagnosis not present

## 2017-12-21 DIAGNOSIS — D509 Iron deficiency anemia, unspecified: Secondary | ICD-10-CM | POA: Diagnosis not present

## 2017-12-21 DIAGNOSIS — D631 Anemia in chronic kidney disease: Secondary | ICD-10-CM | POA: Diagnosis not present

## 2017-12-21 DIAGNOSIS — Z992 Dependence on renal dialysis: Secondary | ICD-10-CM | POA: Diagnosis not present

## 2017-12-21 DIAGNOSIS — N186 End stage renal disease: Secondary | ICD-10-CM | POA: Diagnosis not present

## 2017-12-21 DIAGNOSIS — N2581 Secondary hyperparathyroidism of renal origin: Secondary | ICD-10-CM | POA: Diagnosis not present

## 2017-12-22 DIAGNOSIS — E119 Type 2 diabetes mellitus without complications: Secondary | ICD-10-CM | POA: Diagnosis not present

## 2017-12-22 DIAGNOSIS — D509 Iron deficiency anemia, unspecified: Secondary | ICD-10-CM | POA: Diagnosis not present

## 2017-12-22 DIAGNOSIS — Z992 Dependence on renal dialysis: Secondary | ICD-10-CM | POA: Diagnosis not present

## 2017-12-22 DIAGNOSIS — N186 End stage renal disease: Secondary | ICD-10-CM | POA: Diagnosis not present

## 2017-12-22 DIAGNOSIS — D631 Anemia in chronic kidney disease: Secondary | ICD-10-CM | POA: Diagnosis not present

## 2017-12-22 DIAGNOSIS — N2581 Secondary hyperparathyroidism of renal origin: Secondary | ICD-10-CM | POA: Diagnosis not present

## 2017-12-23 DIAGNOSIS — D509 Iron deficiency anemia, unspecified: Secondary | ICD-10-CM | POA: Diagnosis not present

## 2017-12-23 DIAGNOSIS — N2581 Secondary hyperparathyroidism of renal origin: Secondary | ICD-10-CM | POA: Diagnosis not present

## 2017-12-23 DIAGNOSIS — N186 End stage renal disease: Secondary | ICD-10-CM | POA: Diagnosis not present

## 2017-12-23 DIAGNOSIS — Z992 Dependence on renal dialysis: Secondary | ICD-10-CM | POA: Diagnosis not present

## 2017-12-23 DIAGNOSIS — D631 Anemia in chronic kidney disease: Secondary | ICD-10-CM | POA: Diagnosis not present

## 2017-12-24 DIAGNOSIS — Z992 Dependence on renal dialysis: Secondary | ICD-10-CM | POA: Diagnosis not present

## 2017-12-24 DIAGNOSIS — N2581 Secondary hyperparathyroidism of renal origin: Secondary | ICD-10-CM | POA: Diagnosis not present

## 2017-12-24 DIAGNOSIS — D509 Iron deficiency anemia, unspecified: Secondary | ICD-10-CM | POA: Diagnosis not present

## 2017-12-24 DIAGNOSIS — D631 Anemia in chronic kidney disease: Secondary | ICD-10-CM | POA: Diagnosis not present

## 2017-12-24 DIAGNOSIS — N186 End stage renal disease: Secondary | ICD-10-CM | POA: Diagnosis not present

## 2017-12-25 DIAGNOSIS — N2581 Secondary hyperparathyroidism of renal origin: Secondary | ICD-10-CM | POA: Diagnosis not present

## 2017-12-25 DIAGNOSIS — D509 Iron deficiency anemia, unspecified: Secondary | ICD-10-CM | POA: Diagnosis not present

## 2017-12-25 DIAGNOSIS — D631 Anemia in chronic kidney disease: Secondary | ICD-10-CM | POA: Diagnosis not present

## 2017-12-25 DIAGNOSIS — N186 End stage renal disease: Secondary | ICD-10-CM | POA: Diagnosis not present

## 2017-12-25 DIAGNOSIS — Z992 Dependence on renal dialysis: Secondary | ICD-10-CM | POA: Diagnosis not present

## 2017-12-26 DIAGNOSIS — D631 Anemia in chronic kidney disease: Secondary | ICD-10-CM | POA: Diagnosis not present

## 2017-12-26 DIAGNOSIS — N2581 Secondary hyperparathyroidism of renal origin: Secondary | ICD-10-CM | POA: Diagnosis not present

## 2017-12-26 DIAGNOSIS — D509 Iron deficiency anemia, unspecified: Secondary | ICD-10-CM | POA: Diagnosis not present

## 2017-12-26 DIAGNOSIS — N186 End stage renal disease: Secondary | ICD-10-CM | POA: Diagnosis not present

## 2017-12-26 DIAGNOSIS — Z992 Dependence on renal dialysis: Secondary | ICD-10-CM | POA: Diagnosis not present

## 2017-12-27 DIAGNOSIS — D509 Iron deficiency anemia, unspecified: Secondary | ICD-10-CM | POA: Diagnosis not present

## 2017-12-27 DIAGNOSIS — N186 End stage renal disease: Secondary | ICD-10-CM | POA: Diagnosis not present

## 2017-12-27 DIAGNOSIS — N2581 Secondary hyperparathyroidism of renal origin: Secondary | ICD-10-CM | POA: Diagnosis not present

## 2017-12-27 DIAGNOSIS — Z992 Dependence on renal dialysis: Secondary | ICD-10-CM | POA: Diagnosis not present

## 2017-12-27 DIAGNOSIS — D631 Anemia in chronic kidney disease: Secondary | ICD-10-CM | POA: Diagnosis not present

## 2017-12-28 DIAGNOSIS — Z823 Family history of stroke: Secondary | ICD-10-CM | POA: Diagnosis not present

## 2017-12-28 DIAGNOSIS — J9811 Atelectasis: Secondary | ICD-10-CM | POA: Diagnosis not present

## 2017-12-28 DIAGNOSIS — D631 Anemia in chronic kidney disease: Secondary | ICD-10-CM | POA: Diagnosis not present

## 2017-12-28 DIAGNOSIS — J90 Pleural effusion, not elsewhere classified: Secondary | ICD-10-CM | POA: Diagnosis not present

## 2017-12-28 DIAGNOSIS — N2581 Secondary hyperparathyroidism of renal origin: Secondary | ICD-10-CM | POA: Diagnosis not present

## 2017-12-28 DIAGNOSIS — E1122 Type 2 diabetes mellitus with diabetic chronic kidney disease: Secondary | ICD-10-CM | POA: Diagnosis not present

## 2017-12-28 DIAGNOSIS — Z833 Family history of diabetes mellitus: Secondary | ICD-10-CM | POA: Diagnosis not present

## 2017-12-28 DIAGNOSIS — I120 Hypertensive chronic kidney disease with stage 5 chronic kidney disease or end stage renal disease: Secondary | ICD-10-CM | POA: Diagnosis not present

## 2017-12-28 DIAGNOSIS — R079 Chest pain, unspecified: Secondary | ICD-10-CM | POA: Diagnosis not present

## 2017-12-28 DIAGNOSIS — Z79899 Other long term (current) drug therapy: Secondary | ICD-10-CM | POA: Diagnosis not present

## 2017-12-28 DIAGNOSIS — Z992 Dependence on renal dialysis: Secondary | ICD-10-CM | POA: Diagnosis not present

## 2017-12-28 DIAGNOSIS — Z8249 Family history of ischemic heart disease and other diseases of the circulatory system: Secondary | ICD-10-CM | POA: Diagnosis not present

## 2017-12-28 DIAGNOSIS — D509 Iron deficiency anemia, unspecified: Secondary | ICD-10-CM | POA: Diagnosis not present

## 2017-12-28 DIAGNOSIS — Z87891 Personal history of nicotine dependence: Secondary | ICD-10-CM | POA: Diagnosis not present

## 2017-12-28 DIAGNOSIS — R0789 Other chest pain: Secondary | ICD-10-CM | POA: Diagnosis not present

## 2017-12-28 DIAGNOSIS — F419 Anxiety disorder, unspecified: Secondary | ICD-10-CM | POA: Diagnosis not present

## 2017-12-28 DIAGNOSIS — N186 End stage renal disease: Secondary | ICD-10-CM | POA: Diagnosis not present

## 2017-12-29 ENCOUNTER — Encounter: Payer: Self-pay | Admitting: Family Medicine

## 2017-12-29 ENCOUNTER — Ambulatory Visit (INDEPENDENT_AMBULATORY_CARE_PROVIDER_SITE_OTHER): Payer: Medicare Other | Admitting: Family Medicine

## 2017-12-29 VITALS — BP 140/80 | HR 80 | Ht 69.0 in | Wt 264.0 lb

## 2017-12-29 DIAGNOSIS — D509 Iron deficiency anemia, unspecified: Secondary | ICD-10-CM | POA: Diagnosis not present

## 2017-12-29 DIAGNOSIS — Z9119 Patient's noncompliance with other medical treatment and regimen: Secondary | ICD-10-CM | POA: Diagnosis not present

## 2017-12-29 DIAGNOSIS — F419 Anxiety disorder, unspecified: Secondary | ICD-10-CM | POA: Diagnosis not present

## 2017-12-29 DIAGNOSIS — G4700 Insomnia, unspecified: Secondary | ICD-10-CM | POA: Diagnosis not present

## 2017-12-29 DIAGNOSIS — E118 Type 2 diabetes mellitus with unspecified complications: Secondary | ICD-10-CM

## 2017-12-29 DIAGNOSIS — N2581 Secondary hyperparathyroidism of renal origin: Secondary | ICD-10-CM | POA: Diagnosis not present

## 2017-12-29 DIAGNOSIS — Z91199 Patient's noncompliance with other medical treatment and regimen due to unspecified reason: Secondary | ICD-10-CM

## 2017-12-29 DIAGNOSIS — N184 Chronic kidney disease, stage 4 (severe): Secondary | ICD-10-CM | POA: Diagnosis not present

## 2017-12-29 DIAGNOSIS — Z992 Dependence on renal dialysis: Secondary | ICD-10-CM | POA: Diagnosis not present

## 2017-12-29 DIAGNOSIS — N186 End stage renal disease: Secondary | ICD-10-CM | POA: Diagnosis not present

## 2017-12-29 DIAGNOSIS — D631 Anemia in chronic kidney disease: Secondary | ICD-10-CM | POA: Diagnosis not present

## 2017-12-29 LAB — GLUCOSE, POCT (MANUAL RESULT ENTRY): POC Glucose: 304 mg/dl — AB (ref 70–99)

## 2017-12-29 MED ORDER — HYDROXYZINE HCL 25 MG PO TABS
25.00 | ORAL_TABLET | ORAL | Status: DC
Start: ? — End: 2017-12-29

## 2017-12-29 MED ORDER — INSULIN ASPART 100 UNIT/ML FLEXPEN
2.0000 [IU] | PEN_INJECTOR | Freq: Two times a day (BID) | SUBCUTANEOUS | 11 refills | Status: DC
Start: 2017-12-29 — End: 2017-12-29

## 2017-12-29 MED ORDER — "PEN NEEDLES 3/16"" 31G X 5 MM MISC": 2.0000 | Freq: Every day | 0 refills | Status: DC

## 2017-12-29 MED ORDER — INSULIN REGULAR HUMAN 100 UNIT/ML IJ SOLN: 2.0000 [IU] | Freq: Every day | INTRAMUSCULAR | 11 refills | Status: DC

## 2017-12-29 MED ORDER — GLIPIZIDE 5 MG PO TABS: 5.0000 mg | ORAL_TABLET | Freq: Every day | ORAL | 1 refills | Status: DC

## 2017-12-29 MED ORDER — LIRAGLUTIDE 18 MG/3ML ~~LOC~~ SOPN: 0.6000 mg | PEN_INJECTOR | Freq: Every day | SUBCUTANEOUS | 3 refills | Status: DC

## 2017-12-29 MED ORDER — DOXEPIN HCL 10 MG PO CAPS: 10.0000 mg | ORAL_CAPSULE | Freq: Every day | ORAL | 0 refills | Status: DC

## 2017-12-29 MED ORDER — INSULIN SYRINGES (DISPOSABLE) U-100 1 ML MISC: 1.0000 | Freq: Every day | 0 refills | Status: DC

## 2017-12-29 NOTE — Progress Notes (Addendum)
Name: Jake Gomez   MRN: [id number]    DOB: 1971-05-01   Date:12/29/2017       Progress Note  Subjective  Chief Complaint  Chief Complaint  Patient presents with  . Insomnia    was having itching so derm prescribed Doxepin to help him sleep and calm itching. One pill- can't sleep and leaves him feeling tired the next day. 2 pills- too much/ slept for 2 days    Insomnia  Primary symptoms: no fragmented sleep, no sleep disturbance, difficulty falling asleep, no somnolence, no frequent awakening, no premature morning awakening, no malaise/fatigue, no napping.   The current episode started more than one year. The onset quality is gradual. The problem occurs nightly. The problem has been rapidly improving since onset. The symptoms are aggravated by anxiety (medical stress). PMH includes: family stress or anxiety.  Diabetes  He presents for his initial diabetic visit. He has type 2 diabetes mellitus. His disease course has been worsening. There are no hypoglycemic associated symptoms. Pertinent negatives for diabetes include no polydipsia, no polyphagia and no polyuria. There are no hypoglycemic complications. Symptoms are worsening. Risk factors for coronary artery disease include diabetes mellitus.    Chronic renal disease, stage 4, severely decreased glomerular filtration rate between 15-29 mL/min/1.73 square meter (HCC)  Needs to resume medical regimen. Will initiate Victoza 0.6 mg/day and resume novalog sliding scale premeal. Evaluate A1C and renal panel. Recheck 2week.   Past Medical History:  Diagnosis Date  . Diabetes mellitus without complication ([place])   . Hypertension   . Renal disorder     Past Surgical History:  Procedure Laterality Date  . peritonal dialysis      History reviewed. No pertinent family history.  Social History   Socioeconomic History  . Marital status: Single    Spouse name: Not on file  . Number of children: Not on file  . Years of education: Not on  file  . Highest education level: Not on file  Occupational History  . Not on file  Social Needs  . Financial resource strain: Not on file  . Food insecurity:    Worry: Not on file    Inability: Not on file  . Transportation needs:    Medical: Not on file    Non-medical: Not on file  Tobacco Use  . Smoking status: Former Research scientist (life sciences)  . Smokeless tobacco: Former Systems developer    Quit date: 07/16/2009  Substance and Sexual Activity  . Alcohol use: No    Frequency: Never  . Drug use: No  . Sexual activity: Not on file  Lifestyle  . Physical activity:    Days per week: Not on file    Minutes per session: Not on file  . Stress: Not on file  Relationships  . Social connections:    Talks on phone: Not on file    Gets together: Not on file    Attends religious service: Not on file    Active member of club or organization: Not on file    Attends meetings of clubs or organizations: Not on file    Relationship status: Not on file  . Intimate partner violence:    Fear of current or ex partner: Not on file    Emotionally abused: Not on file    Physically abused: Not on file    Forced sexual activity: Not on file  Other Topics Concern  . Not on file  Social History Narrative  . Not on file  No Known Allergies  Outpatient Medications Prior to Visit  Medication Sig Dispense Refill  . acetaminophen (TYLENOL) 500 MG tablet Take 500 mg by mouth.    Marland Kitchen amLODipine (NORVASC) 5 MG tablet Take 10 mg by mouth 2 (two) times daily.     Marland Kitchen atorvastatin (LIPITOR) 80 MG tablet Take 80 mg by mouth.    . bumetanide (BUMEX) 2 MG tablet Take 2 mg by mouth daily.     . calcium acetate (PHOSLO) 667 MG capsule Take 2,001 mg by mouth 3 (three) times daily with meals.     . irbesartan (AVAPRO) 300 MG tablet Take 300 mg by mouth at bedtime.     Marland Kitchen labetalol (NORMODYNE) 200 MG tablet Take 200 mg by mouth 2 (two) times daily.     . Vitamin D, Ergocalciferol, (DRISDOL) 50000 units CAPS capsule Take 50,000 Units by mouth  every 7 (seven) days.     Marland Kitchen doxepin (SINEQUAN) 25 MG capsule Take 1 capsule by mouth daily. derm  0  . amoxicillin-clavulanate (AUGMENTIN) 875-125 MG tablet Take 1 tablet by mouth 2 (two) times daily. 20 tablet 0  . benzonatate (TESSALON) 100 MG capsule Take 1 capsule (100 mg total) by mouth 2 (two) times daily as needed for cough. 20 capsule 0  . guaiFENesin-codeine (ROBITUSSIN AC) 100-10 MG/5ML syrup Take 5 mLs by mouth 3 (three) times daily as needed for cough. 150 mL 0   No facility-administered medications prior to visit.     Review of Systems  Constitutional: Negative for malaise/fatigue.  Endo/Heme/Allergies: Negative for polydipsia and polyphagia.  Psychiatric/Behavioral: Negative for sleep disturbance. The patient has insomnia.      Objective  Vitals:   12/29/17 1036  BP: 140/80  Pulse: 80  Weight: 264 lb (119.7 kg)  Height: 5\' 9"  (1.753 m)    Physical Exam    Assessment & Plan  Problem List Items Addressed This Visit      Endocrine   Diabetes mellitus ([place]) - Primary   Relevant Medications   liraglutide (VICTOZA) 18 MG/3ML SOPN   Insulin Pen Needle (PEN NEEDLES 3/16") 31G X 5 MM MISC   insulin aspart (NOVOLOG) 100 UNIT/ML FlexPen   Other Relevant Orders   HgB A1c   Renal Function Panel   POCT Glucose (CBG) (Completed)   Ambulatory referral to Endocrinology     Genitourinary   Chronic renal disease, stage 4, severely decreased glomerular filtration rate between 15-29 mL/min/1.73 square meter (HCC)     Needs to resume medical regimen. Will initiate Victoza 0.6 mg/day and resume novalog sliding scale premeal. Evaluate A1C and renal panel. Recheck 2week.       Other Visit Diagnoses    Insomnia, unspecified type       Unable to tolerate Doxepin 25 mg ,will trial decreased dose to 10 mg.   Relevant Medications   doxepin (SINEQUAN) 10 MG capsule   Medically noncompliant       Patient stopped all medications 2 years ago and cease going to endocrine.    Anxiety       Trial of Doxepin 10 mg   Relevant Medications   doxepin (SINEQUAN) 10 MG capsule      Meds ordered this encounter  Medications  . doxepin (SINEQUAN) 10 MG capsule    Sig: Take 1 capsule (10 mg total) by mouth at bedtime.    Dispense:  30 capsule    Refill:  0  . liraglutide (VICTOZA) 18 MG/3ML SOPN    Sig: Inject 0.1  mLs (0.6 mg total) into the skin daily.    Dispense:  1 pen    Refill:  3  . Insulin Pen Needle (PEN NEEDLES 3/16") 31G X 5 MM MISC    Sig: 2 each by Does not apply route daily.    Dispense:  100 each    Refill:  0  . insulin aspart (NOVOLOG) 100 UNIT/ML FlexPen    Sig: Inject 2 Units into the skin 2 (two) times daily. Pt will be on sliding scale with 2 meals a day- 201-250= 2 units/ 251-300= 3units/ 301-350= 4 units/ above 351= 5 units    Dispense:  15 mL    Refill:  11      Dr. Deanna Jones [facility]  12/29/17

## 2017-12-29 NOTE — Assessment & Plan Note (Signed)
Needs to resume medical regimen. Will initiate Victoza 0.6 mg/day and resume novalog sliding scale premeal. Evaluate A1C and renal panel. Recheck 2week.

## 2017-12-29 NOTE — Addendum Note (Signed)
Addended by: Fredderick Severance on: 12/29/2017 04:22 PM   Modules accepted: Orders

## 2017-12-30 DIAGNOSIS — N186 End stage renal disease: Secondary | ICD-10-CM | POA: Diagnosis not present

## 2017-12-30 DIAGNOSIS — D631 Anemia in chronic kidney disease: Secondary | ICD-10-CM | POA: Diagnosis not present

## 2017-12-30 DIAGNOSIS — Z992 Dependence on renal dialysis: Secondary | ICD-10-CM | POA: Diagnosis not present

## 2017-12-30 DIAGNOSIS — N2581 Secondary hyperparathyroidism of renal origin: Secondary | ICD-10-CM | POA: Diagnosis not present

## 2017-12-30 DIAGNOSIS — D509 Iron deficiency anemia, unspecified: Secondary | ICD-10-CM | POA: Diagnosis not present

## 2017-12-31 DIAGNOSIS — D509 Iron deficiency anemia, unspecified: Secondary | ICD-10-CM | POA: Diagnosis not present

## 2017-12-31 DIAGNOSIS — D631 Anemia in chronic kidney disease: Secondary | ICD-10-CM | POA: Diagnosis not present

## 2017-12-31 DIAGNOSIS — N2581 Secondary hyperparathyroidism of renal origin: Secondary | ICD-10-CM | POA: Diagnosis not present

## 2017-12-31 DIAGNOSIS — Z992 Dependence on renal dialysis: Secondary | ICD-10-CM | POA: Diagnosis not present

## 2017-12-31 DIAGNOSIS — N186 End stage renal disease: Secondary | ICD-10-CM | POA: Diagnosis not present

## 2018-01-01 DIAGNOSIS — D631 Anemia in chronic kidney disease: Secondary | ICD-10-CM | POA: Diagnosis not present

## 2018-01-01 DIAGNOSIS — N2581 Secondary hyperparathyroidism of renal origin: Secondary | ICD-10-CM | POA: Diagnosis not present

## 2018-01-01 DIAGNOSIS — N186 End stage renal disease: Secondary | ICD-10-CM | POA: Diagnosis not present

## 2018-01-01 DIAGNOSIS — D509 Iron deficiency anemia, unspecified: Secondary | ICD-10-CM | POA: Diagnosis not present

## 2018-01-01 DIAGNOSIS — Z992 Dependence on renal dialysis: Secondary | ICD-10-CM | POA: Diagnosis not present

## 2018-01-02 DIAGNOSIS — N186 End stage renal disease: Secondary | ICD-10-CM | POA: Diagnosis not present

## 2018-01-02 DIAGNOSIS — Z992 Dependence on renal dialysis: Secondary | ICD-10-CM | POA: Diagnosis not present

## 2018-01-02 DIAGNOSIS — D631 Anemia in chronic kidney disease: Secondary | ICD-10-CM | POA: Diagnosis not present

## 2018-01-02 DIAGNOSIS — D509 Iron deficiency anemia, unspecified: Secondary | ICD-10-CM | POA: Diagnosis not present

## 2018-01-02 DIAGNOSIS — N2581 Secondary hyperparathyroidism of renal origin: Secondary | ICD-10-CM | POA: Diagnosis not present

## 2018-01-03 DIAGNOSIS — D631 Anemia in chronic kidney disease: Secondary | ICD-10-CM | POA: Diagnosis not present

## 2018-01-03 DIAGNOSIS — N2581 Secondary hyperparathyroidism of renal origin: Secondary | ICD-10-CM | POA: Diagnosis not present

## 2018-01-03 DIAGNOSIS — Z992 Dependence on renal dialysis: Secondary | ICD-10-CM | POA: Diagnosis not present

## 2018-01-03 DIAGNOSIS — D509 Iron deficiency anemia, unspecified: Secondary | ICD-10-CM | POA: Diagnosis not present

## 2018-01-03 DIAGNOSIS — N186 End stage renal disease: Secondary | ICD-10-CM | POA: Diagnosis not present

## 2018-01-04 DIAGNOSIS — N186 End stage renal disease: Secondary | ICD-10-CM | POA: Diagnosis not present

## 2018-01-04 DIAGNOSIS — D631 Anemia in chronic kidney disease: Secondary | ICD-10-CM | POA: Diagnosis not present

## 2018-01-04 DIAGNOSIS — N2581 Secondary hyperparathyroidism of renal origin: Secondary | ICD-10-CM | POA: Diagnosis not present

## 2018-01-04 DIAGNOSIS — D509 Iron deficiency anemia, unspecified: Secondary | ICD-10-CM | POA: Diagnosis not present

## 2018-01-04 DIAGNOSIS — Z992 Dependence on renal dialysis: Secondary | ICD-10-CM | POA: Diagnosis not present

## 2018-01-05 DIAGNOSIS — Z992 Dependence on renal dialysis: Secondary | ICD-10-CM | POA: Diagnosis not present

## 2018-01-05 DIAGNOSIS — N186 End stage renal disease: Secondary | ICD-10-CM | POA: Diagnosis not present

## 2018-01-05 DIAGNOSIS — N2581 Secondary hyperparathyroidism of renal origin: Secondary | ICD-10-CM | POA: Diagnosis not present

## 2018-01-05 DIAGNOSIS — D631 Anemia in chronic kidney disease: Secondary | ICD-10-CM | POA: Diagnosis not present

## 2018-01-05 DIAGNOSIS — D509 Iron deficiency anemia, unspecified: Secondary | ICD-10-CM | POA: Diagnosis not present

## 2018-01-06 DIAGNOSIS — N186 End stage renal disease: Secondary | ICD-10-CM | POA: Diagnosis not present

## 2018-01-06 DIAGNOSIS — D631 Anemia in chronic kidney disease: Secondary | ICD-10-CM | POA: Diagnosis not present

## 2018-01-06 DIAGNOSIS — N2581 Secondary hyperparathyroidism of renal origin: Secondary | ICD-10-CM | POA: Diagnosis not present

## 2018-01-06 DIAGNOSIS — D509 Iron deficiency anemia, unspecified: Secondary | ICD-10-CM | POA: Diagnosis not present

## 2018-01-06 DIAGNOSIS — Z992 Dependence on renal dialysis: Secondary | ICD-10-CM | POA: Diagnosis not present

## 2018-01-07 DIAGNOSIS — N2581 Secondary hyperparathyroidism of renal origin: Secondary | ICD-10-CM | POA: Diagnosis not present

## 2018-01-07 DIAGNOSIS — Z992 Dependence on renal dialysis: Secondary | ICD-10-CM | POA: Diagnosis not present

## 2018-01-07 DIAGNOSIS — D509 Iron deficiency anemia, unspecified: Secondary | ICD-10-CM | POA: Diagnosis not present

## 2018-01-07 DIAGNOSIS — N186 End stage renal disease: Secondary | ICD-10-CM | POA: Diagnosis not present

## 2018-01-07 DIAGNOSIS — D631 Anemia in chronic kidney disease: Secondary | ICD-10-CM | POA: Diagnosis not present

## 2018-01-08 DIAGNOSIS — N2581 Secondary hyperparathyroidism of renal origin: Secondary | ICD-10-CM | POA: Diagnosis not present

## 2018-01-08 DIAGNOSIS — D631 Anemia in chronic kidney disease: Secondary | ICD-10-CM | POA: Diagnosis not present

## 2018-01-08 DIAGNOSIS — N186 End stage renal disease: Secondary | ICD-10-CM | POA: Diagnosis not present

## 2018-01-08 DIAGNOSIS — D509 Iron deficiency anemia, unspecified: Secondary | ICD-10-CM | POA: Diagnosis not present

## 2018-01-08 DIAGNOSIS — Z992 Dependence on renal dialysis: Secondary | ICD-10-CM | POA: Diagnosis not present

## 2018-01-09 ENCOUNTER — Other Ambulatory Visit: Payer: Self-pay

## 2018-01-09 DIAGNOSIS — D631 Anemia in chronic kidney disease: Secondary | ICD-10-CM | POA: Diagnosis not present

## 2018-01-09 DIAGNOSIS — D509 Iron deficiency anemia, unspecified: Secondary | ICD-10-CM | POA: Diagnosis not present

## 2018-01-09 DIAGNOSIS — E119 Type 2 diabetes mellitus without complications: Secondary | ICD-10-CM

## 2018-01-09 DIAGNOSIS — Z794 Long term (current) use of insulin: Principal | ICD-10-CM

## 2018-01-09 DIAGNOSIS — N186 End stage renal disease: Secondary | ICD-10-CM | POA: Diagnosis not present

## 2018-01-09 DIAGNOSIS — N2581 Secondary hyperparathyroidism of renal origin: Secondary | ICD-10-CM | POA: Diagnosis not present

## 2018-01-09 DIAGNOSIS — Z992 Dependence on renal dialysis: Secondary | ICD-10-CM | POA: Diagnosis not present

## 2018-01-09 MED ORDER — INSULIN REGULAR HUMAN 100 UNIT/ML IJ SOLN: 2.0000 [IU] | Freq: Every day | INTRAMUSCULAR | 4 refills | Status: DC

## 2018-01-10 DIAGNOSIS — Z992 Dependence on renal dialysis: Secondary | ICD-10-CM | POA: Diagnosis not present

## 2018-01-10 DIAGNOSIS — N2581 Secondary hyperparathyroidism of renal origin: Secondary | ICD-10-CM | POA: Diagnosis not present

## 2018-01-10 DIAGNOSIS — D509 Iron deficiency anemia, unspecified: Secondary | ICD-10-CM | POA: Diagnosis not present

## 2018-01-10 DIAGNOSIS — D631 Anemia in chronic kidney disease: Secondary | ICD-10-CM | POA: Diagnosis not present

## 2018-01-10 DIAGNOSIS — N186 End stage renal disease: Secondary | ICD-10-CM | POA: Diagnosis not present

## 2018-01-11 DIAGNOSIS — D509 Iron deficiency anemia, unspecified: Secondary | ICD-10-CM | POA: Diagnosis not present

## 2018-01-11 DIAGNOSIS — N2581 Secondary hyperparathyroidism of renal origin: Secondary | ICD-10-CM | POA: Diagnosis not present

## 2018-01-11 DIAGNOSIS — N186 End stage renal disease: Secondary | ICD-10-CM | POA: Diagnosis not present

## 2018-01-11 DIAGNOSIS — D631 Anemia in chronic kidney disease: Secondary | ICD-10-CM | POA: Diagnosis not present

## 2018-01-11 DIAGNOSIS — Z992 Dependence on renal dialysis: Secondary | ICD-10-CM | POA: Diagnosis not present

## 2018-01-12 DIAGNOSIS — D631 Anemia in chronic kidney disease: Secondary | ICD-10-CM | POA: Diagnosis not present

## 2018-01-12 DIAGNOSIS — N186 End stage renal disease: Secondary | ICD-10-CM | POA: Diagnosis not present

## 2018-01-12 DIAGNOSIS — D509 Iron deficiency anemia, unspecified: Secondary | ICD-10-CM | POA: Diagnosis not present

## 2018-01-12 DIAGNOSIS — Z992 Dependence on renal dialysis: Secondary | ICD-10-CM | POA: Diagnosis not present

## 2018-01-13 DIAGNOSIS — D631 Anemia in chronic kidney disease: Secondary | ICD-10-CM | POA: Diagnosis not present

## 2018-01-13 DIAGNOSIS — D509 Iron deficiency anemia, unspecified: Secondary | ICD-10-CM | POA: Diagnosis not present

## 2018-01-13 DIAGNOSIS — N186 End stage renal disease: Secondary | ICD-10-CM | POA: Diagnosis not present

## 2018-01-13 DIAGNOSIS — Z992 Dependence on renal dialysis: Secondary | ICD-10-CM | POA: Diagnosis not present

## 2018-01-14 DIAGNOSIS — D509 Iron deficiency anemia, unspecified: Secondary | ICD-10-CM | POA: Diagnosis not present

## 2018-01-14 DIAGNOSIS — Z992 Dependence on renal dialysis: Secondary | ICD-10-CM | POA: Diagnosis not present

## 2018-01-14 DIAGNOSIS — D631 Anemia in chronic kidney disease: Secondary | ICD-10-CM | POA: Diagnosis not present

## 2018-01-14 DIAGNOSIS — N186 End stage renal disease: Secondary | ICD-10-CM | POA: Diagnosis not present

## 2018-01-15 DIAGNOSIS — Z992 Dependence on renal dialysis: Secondary | ICD-10-CM | POA: Diagnosis not present

## 2018-01-15 DIAGNOSIS — N186 End stage renal disease: Secondary | ICD-10-CM | POA: Diagnosis not present

## 2018-01-15 DIAGNOSIS — D631 Anemia in chronic kidney disease: Secondary | ICD-10-CM | POA: Diagnosis not present

## 2018-01-15 DIAGNOSIS — D509 Iron deficiency anemia, unspecified: Secondary | ICD-10-CM | POA: Diagnosis not present

## 2018-01-16 ENCOUNTER — Ambulatory Visit: Payer: Medicare Other | Admitting: Family Medicine

## 2018-01-16 DIAGNOSIS — D631 Anemia in chronic kidney disease: Secondary | ICD-10-CM | POA: Diagnosis not present

## 2018-01-16 DIAGNOSIS — Z992 Dependence on renal dialysis: Secondary | ICD-10-CM | POA: Diagnosis not present

## 2018-01-16 DIAGNOSIS — N186 End stage renal disease: Secondary | ICD-10-CM | POA: Diagnosis not present

## 2018-01-16 DIAGNOSIS — D509 Iron deficiency anemia, unspecified: Secondary | ICD-10-CM | POA: Diagnosis not present

## 2018-01-17 ENCOUNTER — Encounter: Payer: Self-pay | Admitting: Family Medicine

## 2018-01-17 ENCOUNTER — Ambulatory Visit (INDEPENDENT_AMBULATORY_CARE_PROVIDER_SITE_OTHER): Payer: Medicare Other | Admitting: Family Medicine

## 2018-01-17 DIAGNOSIS — Z794 Long term (current) use of insulin: Secondary | ICD-10-CM

## 2018-01-17 DIAGNOSIS — R809 Proteinuria, unspecified: Secondary | ICD-10-CM

## 2018-01-17 DIAGNOSIS — D631 Anemia in chronic kidney disease: Secondary | ICD-10-CM | POA: Diagnosis not present

## 2018-01-17 DIAGNOSIS — E1129 Type 2 diabetes mellitus with other diabetic kidney complication: Secondary | ICD-10-CM

## 2018-01-17 DIAGNOSIS — N186 End stage renal disease: Secondary | ICD-10-CM | POA: Diagnosis not present

## 2018-01-17 DIAGNOSIS — E118 Type 2 diabetes mellitus with unspecified complications: Secondary | ICD-10-CM | POA: Diagnosis not present

## 2018-01-17 DIAGNOSIS — Z992 Dependence on renal dialysis: Secondary | ICD-10-CM | POA: Diagnosis not present

## 2018-01-17 DIAGNOSIS — D509 Iron deficiency anemia, unspecified: Secondary | ICD-10-CM | POA: Diagnosis not present

## 2018-01-17 MED ORDER — GLIPIZIDE 5 MG PO TABS: 5.0000 mg | ORAL_TABLET | Freq: Two times a day (BID) | ORAL | 1 refills | Status: DC

## 2018-01-17 NOTE — Progress Notes (Signed)
Name: Jake Gomez   MRN: [id number]    DOB: August 16, 1970   Date:01/17/2018       Progress Note  Subjective  Chief Complaint  Chief Complaint  Patient presents with  . Diabetes    follow up on diabetes- needs A1C checked- has not had A1C since February    Diabetes  He presents for his follow-up diabetic visit. He has type 2 diabetes mellitus. His disease course has been stable. There are no hypoglycemic associated symptoms. Pertinent negatives for hypoglycemia include no dizziness, headaches or nervousness/anxiousness. Pertinent negatives for diabetes include no blurred vision, no chest pain, no fatigue, no foot paresthesias, no foot ulcerations, no polydipsia, no polyphagia, no polyuria, no visual change, no weakness and no weight loss. There are no hypoglycemic complications. Symptoms are stable. There are no diabetic complications. There are no known risk factors for coronary artery disease. Current diabetic treatment includes insulin injections and oral agent (monotherapy). He is compliant with treatment most of the time. His weight is fluctuating minimally. He is following a generally healthy diet. Meal planning includes carbohydrate counting and avoidance of concentrated sweets. He participates in exercise intermittently. His breakfast blood glucose is taken between 8-9 am. His breakfast blood glucose range is generally 140-180 mg/dl. (170's)    Diabetes mellitus (HCC) Chronic Uncontrolled Continue novolen R 2 units with breakfast and glipizide 5 mg daily. Check a1c. Next step either double insulin or glipizide.   Past Medical History:  Diagnosis Date  . Diabetes mellitus without complication ([place])   . Hypertension   . Renal disorder     Past Surgical History:  Procedure Laterality Date  . peritonal dialysis      History reviewed. No pertinent family history.  Social History   Socioeconomic History  . Marital status: Single    Spouse name: Not on file  . Number of children:  Not on file  . Years of education: Not on file  . Highest education level: Not on file  Occupational History  . Not on file  Social Needs  . Financial resource strain: Not on file  . Food insecurity:    Worry: Not on file    Inability: Not on file  . Transportation needs:    Medical: Not on file    Non-medical: Not on file  Tobacco Use  . Smoking status: Former Research scientist (life sciences)  . Smokeless tobacco: Former Systems developer    Quit date: 07/16/2009  Substance and Sexual Activity  . Alcohol use: No    Frequency: Never  . Drug use: No  . Sexual activity: Not on file  Lifestyle  . Physical activity:    Days per week: Not on file    Minutes per session: Not on file  . Stress: Not on file  Relationships  . Social connections:    Talks on phone: Not on file    Gets together: Not on file    Attends religious service: Not on file    Active member of club or organization: Not on file    Attends meetings of clubs or organizations: Not on file    Relationship status: Not on file  . Intimate partner violence:    Fear of current or ex partner: Not on file    Emotionally abused: Not on file    Physically abused: Not on file    Forced sexual activity: Not on file  Other Topics Concern  . Not on file  Social History Narrative  . Not on file  No Known Allergies  Outpatient Medications Prior to Visit  Medication Sig Dispense Refill  . acetaminophen (TYLENOL) 500 MG tablet Take 500 mg by mouth.    Marland Kitchen amLODipine (NORVASC) 5 MG tablet Take 10 mg by mouth 2 (two) times daily.     Marland Kitchen atorvastatin (LIPITOR) 80 MG tablet Take 80 mg by mouth.    . bumetanide (BUMEX) 2 MG tablet Take 2 mg by mouth daily.     . calcium acetate (PHOSLO) 667 MG capsule Take 2,001 mg by mouth 3 (three) times daily with meals.     . Insulin Pen Needle (PEN NEEDLES 3/16") 31G X 5 MM MISC 2 each by Does not apply route daily. 100 each 0  . insulin regular (NOVOLIN R RELION) 100 units/mL injection Inject 0.02 mLs (2 Units total) into  the skin daily. 10 mL 4  . Insulin Syringes, Disposable, U-100 1 ML MISC 1 each by Does not apply route daily. 100 each 0  . irbesartan (AVAPRO) 300 MG tablet Take 300 mg by mouth at bedtime.     Marland Kitchen labetalol (NORMODYNE) 200 MG tablet Take 200 mg by mouth 2 (two) times daily.     . traZODone (DESYREL) 50 MG tablet Take 50 mg by mouth at bedtime. Dr Candiss Norse  0  . doxepin (SINEQUAN) 10 MG capsule Take 1 capsule (10 mg total) by mouth at bedtime. 30 capsule 0  . glipiZIDE (GLUCOTROL) 5 MG tablet Take 1 tablet (5 mg total) by mouth daily before breakfast. 30 tablet 1  . Vitamin D, Ergocalciferol, (DRISDOL) 50000 units CAPS capsule Take 50,000 Units by mouth every 7 (seven) days.      No facility-administered medications prior to visit.     Review of Systems  Constitutional: Negative for chills, fatigue, fever, malaise/fatigue and weight loss.  HENT: Negative for ear discharge, ear pain and sore throat.   Eyes: Negative for blurred vision.  Respiratory: Negative for cough, sputum production, shortness of breath and wheezing.   Cardiovascular: Negative for chest pain, palpitations and leg swelling.  Gastrointestinal: Negative for abdominal pain, blood in stool, constipation, diarrhea, heartburn, melena and nausea.  Genitourinary: Negative for dysuria, frequency, hematuria and urgency.  Musculoskeletal: Negative for back pain, joint pain, myalgias and neck pain.  Skin: Negative for rash.  Neurological: Negative for dizziness, tingling, sensory change, focal weakness, weakness and headaches.  Endo/Heme/Allergies: Negative for environmental allergies, polydipsia and polyphagia. Does not bruise/bleed easily.  Psychiatric/Behavioral: Negative for depression and suicidal ideas. The patient is not nervous/anxious and does not have insomnia.      Objective  Vitals:   01/17/18 1338  BP: (!) 150/88  Pulse: 72  Weight: 261 lb (118.4 kg)  Height: 5\' 9"  (1.753 m)    Physical Exam  Constitutional: He  is oriented to person, place, and time.  HENT:  Head: Normocephalic.  Right Ear: External ear normal.  Left Ear: External ear normal.  Nose: Nose normal.  Mouth/Throat: Oropharynx is clear and moist.  Eyes: Pupils are equal, round, and reactive to light. Conjunctivae and EOM are normal. Right eye exhibits no discharge. Left eye exhibits no discharge. No scleral icterus.  Neck: Normal range of motion. Neck supple. No JVD present. No tracheal deviation present. No thyromegaly present.  Cardiovascular: Normal rate, regular rhythm, normal heart sounds and intact distal pulses. Exam reveals no gallop and no friction rub.  No murmur heard. Pulmonary/Chest: Breath sounds normal. No respiratory distress. He has no wheezes. He has no rales.  Abdominal: Soft.  Bowel sounds are normal. He exhibits no mass. There is no hepatosplenomegaly. There is no tenderness. There is no rebound, no guarding and no CVA tenderness.  Musculoskeletal: Normal range of motion. He exhibits no edema or tenderness.  Lymphadenopathy:    He has no cervical adenopathy.  Neurological: He is alert and oriented to person, place, and time. He has normal strength and normal reflexes. No cranial nerve deficit.  Skin: Skin is warm. No rash noted.  Nursing note and vitals reviewed.     Assessment & Plan  Problem List Items Addressed This Visit      Endocrine   Diabetes mellitus ([place])    Chronic Uncontrolled Continue novolen R 2 units with breakfast and glipizide 5 mg daily. Check a1c. Next step either double insulin or glipizide.      Relevant Medications   glipiZIDE (GLUCOTROL) 5 MG tablet   Other Relevant Orders   Hemoglobin A1c      Meds ordered this encounter  Medications  . glipiZIDE (GLUCOTROL) 5 MG tablet    Sig: Take 1 tablet (5 mg total) by mouth 2 (two) times daily before a meal.    Dispense:  60 tablet    Refill:  1      Dr. Macon [facility]  01/17/18

## 2018-01-17 NOTE — Assessment & Plan Note (Signed)
Chronic Uncontrolled Continue novolen R 2 units with breakfast and glipizide 5 mg daily. Check a1c. Next step either double insulin or glipizide.

## 2018-01-18 DIAGNOSIS — D509 Iron deficiency anemia, unspecified: Secondary | ICD-10-CM | POA: Diagnosis not present

## 2018-01-18 DIAGNOSIS — N186 End stage renal disease: Secondary | ICD-10-CM | POA: Diagnosis not present

## 2018-01-18 DIAGNOSIS — Z992 Dependence on renal dialysis: Secondary | ICD-10-CM | POA: Diagnosis not present

## 2018-01-18 DIAGNOSIS — D631 Anemia in chronic kidney disease: Secondary | ICD-10-CM | POA: Diagnosis not present

## 2018-01-18 LAB — HEMOGLOBIN A1C
Est. average glucose Bld gHb Est-mCnc: 192 mg/dL
Hgb A1c MFr Bld: 8.3 % — ABNORMAL HIGH (ref 4.8–5.6)

## 2018-01-19 DIAGNOSIS — D631 Anemia in chronic kidney disease: Secondary | ICD-10-CM | POA: Diagnosis not present

## 2018-01-19 DIAGNOSIS — Z992 Dependence on renal dialysis: Secondary | ICD-10-CM | POA: Diagnosis not present

## 2018-01-19 DIAGNOSIS — D509 Iron deficiency anemia, unspecified: Secondary | ICD-10-CM | POA: Diagnosis not present

## 2018-01-19 DIAGNOSIS — N186 End stage renal disease: Secondary | ICD-10-CM | POA: Diagnosis not present

## 2018-01-20 DIAGNOSIS — D509 Iron deficiency anemia, unspecified: Secondary | ICD-10-CM | POA: Diagnosis not present

## 2018-01-20 DIAGNOSIS — Z992 Dependence on renal dialysis: Secondary | ICD-10-CM | POA: Diagnosis not present

## 2018-01-20 DIAGNOSIS — D631 Anemia in chronic kidney disease: Secondary | ICD-10-CM | POA: Diagnosis not present

## 2018-01-20 DIAGNOSIS — N186 End stage renal disease: Secondary | ICD-10-CM | POA: Diagnosis not present

## 2018-01-21 DIAGNOSIS — N186 End stage renal disease: Secondary | ICD-10-CM | POA: Diagnosis not present

## 2018-01-21 DIAGNOSIS — D631 Anemia in chronic kidney disease: Secondary | ICD-10-CM | POA: Diagnosis not present

## 2018-01-21 DIAGNOSIS — Z992 Dependence on renal dialysis: Secondary | ICD-10-CM | POA: Diagnosis not present

## 2018-01-21 DIAGNOSIS — D509 Iron deficiency anemia, unspecified: Secondary | ICD-10-CM | POA: Diagnosis not present

## 2018-01-22 DIAGNOSIS — D509 Iron deficiency anemia, unspecified: Secondary | ICD-10-CM | POA: Diagnosis not present

## 2018-01-22 DIAGNOSIS — N186 End stage renal disease: Secondary | ICD-10-CM | POA: Diagnosis not present

## 2018-01-22 DIAGNOSIS — D631 Anemia in chronic kidney disease: Secondary | ICD-10-CM | POA: Diagnosis not present

## 2018-01-22 DIAGNOSIS — Z992 Dependence on renal dialysis: Secondary | ICD-10-CM | POA: Diagnosis not present

## 2018-01-23 DIAGNOSIS — Z992 Dependence on renal dialysis: Secondary | ICD-10-CM | POA: Diagnosis not present

## 2018-01-23 DIAGNOSIS — D631 Anemia in chronic kidney disease: Secondary | ICD-10-CM | POA: Diagnosis not present

## 2018-01-23 DIAGNOSIS — D509 Iron deficiency anemia, unspecified: Secondary | ICD-10-CM | POA: Diagnosis not present

## 2018-01-23 DIAGNOSIS — N186 End stage renal disease: Secondary | ICD-10-CM | POA: Diagnosis not present

## 2018-01-24 DIAGNOSIS — D631 Anemia in chronic kidney disease: Secondary | ICD-10-CM | POA: Diagnosis not present

## 2018-01-24 DIAGNOSIS — D509 Iron deficiency anemia, unspecified: Secondary | ICD-10-CM | POA: Diagnosis not present

## 2018-01-24 DIAGNOSIS — Z992 Dependence on renal dialysis: Secondary | ICD-10-CM | POA: Diagnosis not present

## 2018-01-24 DIAGNOSIS — N186 End stage renal disease: Secondary | ICD-10-CM | POA: Diagnosis not present

## 2018-01-25 DIAGNOSIS — N186 End stage renal disease: Secondary | ICD-10-CM | POA: Diagnosis not present

## 2018-01-25 DIAGNOSIS — Z992 Dependence on renal dialysis: Secondary | ICD-10-CM | POA: Diagnosis not present

## 2018-01-25 DIAGNOSIS — D631 Anemia in chronic kidney disease: Secondary | ICD-10-CM | POA: Diagnosis not present

## 2018-01-25 DIAGNOSIS — D509 Iron deficiency anemia, unspecified: Secondary | ICD-10-CM | POA: Diagnosis not present

## 2018-01-26 DIAGNOSIS — D509 Iron deficiency anemia, unspecified: Secondary | ICD-10-CM | POA: Diagnosis not present

## 2018-01-26 DIAGNOSIS — D631 Anemia in chronic kidney disease: Secondary | ICD-10-CM | POA: Diagnosis not present

## 2018-01-26 DIAGNOSIS — N186 End stage renal disease: Secondary | ICD-10-CM | POA: Diagnosis not present

## 2018-01-26 DIAGNOSIS — Z992 Dependence on renal dialysis: Secondary | ICD-10-CM | POA: Diagnosis not present

## 2018-01-27 DIAGNOSIS — D631 Anemia in chronic kidney disease: Secondary | ICD-10-CM | POA: Diagnosis not present

## 2018-01-27 DIAGNOSIS — D509 Iron deficiency anemia, unspecified: Secondary | ICD-10-CM | POA: Diagnosis not present

## 2018-01-27 DIAGNOSIS — Z992 Dependence on renal dialysis: Secondary | ICD-10-CM | POA: Diagnosis not present

## 2018-01-27 DIAGNOSIS — N186 End stage renal disease: Secondary | ICD-10-CM | POA: Diagnosis not present

## 2018-01-28 DIAGNOSIS — N186 End stage renal disease: Secondary | ICD-10-CM | POA: Diagnosis not present

## 2018-01-28 DIAGNOSIS — D631 Anemia in chronic kidney disease: Secondary | ICD-10-CM | POA: Diagnosis not present

## 2018-01-28 DIAGNOSIS — D509 Iron deficiency anemia, unspecified: Secondary | ICD-10-CM | POA: Diagnosis not present

## 2018-01-28 DIAGNOSIS — Z992 Dependence on renal dialysis: Secondary | ICD-10-CM | POA: Diagnosis not present

## 2018-01-29 DIAGNOSIS — D509 Iron deficiency anemia, unspecified: Secondary | ICD-10-CM | POA: Diagnosis not present

## 2018-01-29 DIAGNOSIS — D631 Anemia in chronic kidney disease: Secondary | ICD-10-CM | POA: Diagnosis not present

## 2018-01-29 DIAGNOSIS — Z992 Dependence on renal dialysis: Secondary | ICD-10-CM | POA: Diagnosis not present

## 2018-01-29 DIAGNOSIS — N186 End stage renal disease: Secondary | ICD-10-CM | POA: Diagnosis not present

## 2018-01-30 DIAGNOSIS — D631 Anemia in chronic kidney disease: Secondary | ICD-10-CM | POA: Diagnosis not present

## 2018-01-30 DIAGNOSIS — L0231 Cutaneous abscess of buttock: Secondary | ICD-10-CM | POA: Diagnosis not present

## 2018-01-30 DIAGNOSIS — Z992 Dependence on renal dialysis: Secondary | ICD-10-CM | POA: Diagnosis not present

## 2018-01-30 DIAGNOSIS — E119 Type 2 diabetes mellitus without complications: Secondary | ICD-10-CM | POA: Diagnosis not present

## 2018-01-30 DIAGNOSIS — Z833 Family history of diabetes mellitus: Secondary | ICD-10-CM | POA: Diagnosis not present

## 2018-01-30 DIAGNOSIS — N184 Chronic kidney disease, stage 4 (severe): Secondary | ICD-10-CM | POA: Diagnosis not present

## 2018-01-30 DIAGNOSIS — Z823 Family history of stroke: Secondary | ICD-10-CM | POA: Diagnosis not present

## 2018-01-30 DIAGNOSIS — I129 Hypertensive chronic kidney disease with stage 1 through stage 4 chronic kidney disease, or unspecified chronic kidney disease: Secondary | ICD-10-CM | POA: Diagnosis not present

## 2018-01-30 DIAGNOSIS — Z87891 Personal history of nicotine dependence: Secondary | ICD-10-CM | POA: Diagnosis not present

## 2018-01-30 DIAGNOSIS — D509 Iron deficiency anemia, unspecified: Secondary | ICD-10-CM | POA: Diagnosis not present

## 2018-01-30 DIAGNOSIS — N186 End stage renal disease: Secondary | ICD-10-CM | POA: Diagnosis not present

## 2018-01-30 DIAGNOSIS — Z6836 Body mass index (BMI) 36.0-36.9, adult: Secondary | ICD-10-CM | POA: Diagnosis not present

## 2018-01-30 DIAGNOSIS — M533 Sacrococcygeal disorders, not elsewhere classified: Secondary | ICD-10-CM | POA: Diagnosis not present

## 2018-01-31 DIAGNOSIS — D509 Iron deficiency anemia, unspecified: Secondary | ICD-10-CM | POA: Diagnosis not present

## 2018-01-31 DIAGNOSIS — Z992 Dependence on renal dialysis: Secondary | ICD-10-CM | POA: Diagnosis not present

## 2018-01-31 DIAGNOSIS — D631 Anemia in chronic kidney disease: Secondary | ICD-10-CM | POA: Diagnosis not present

## 2018-01-31 DIAGNOSIS — N186 End stage renal disease: Secondary | ICD-10-CM | POA: Diagnosis not present

## 2018-02-01 DIAGNOSIS — Z992 Dependence on renal dialysis: Secondary | ICD-10-CM | POA: Diagnosis not present

## 2018-02-01 DIAGNOSIS — D509 Iron deficiency anemia, unspecified: Secondary | ICD-10-CM | POA: Diagnosis not present

## 2018-02-01 DIAGNOSIS — D631 Anemia in chronic kidney disease: Secondary | ICD-10-CM | POA: Diagnosis not present

## 2018-02-01 DIAGNOSIS — N186 End stage renal disease: Secondary | ICD-10-CM | POA: Diagnosis not present

## 2018-02-02 DIAGNOSIS — D631 Anemia in chronic kidney disease: Secondary | ICD-10-CM | POA: Diagnosis not present

## 2018-02-02 DIAGNOSIS — D509 Iron deficiency anemia, unspecified: Secondary | ICD-10-CM | POA: Diagnosis not present

## 2018-02-02 DIAGNOSIS — Z992 Dependence on renal dialysis: Secondary | ICD-10-CM | POA: Diagnosis not present

## 2018-02-02 DIAGNOSIS — N186 End stage renal disease: Secondary | ICD-10-CM | POA: Diagnosis not present

## 2018-02-03 DIAGNOSIS — Z992 Dependence on renal dialysis: Secondary | ICD-10-CM | POA: Diagnosis not present

## 2018-02-03 DIAGNOSIS — D509 Iron deficiency anemia, unspecified: Secondary | ICD-10-CM | POA: Diagnosis not present

## 2018-02-03 DIAGNOSIS — N186 End stage renal disease: Secondary | ICD-10-CM | POA: Diagnosis not present

## 2018-02-03 DIAGNOSIS — D631 Anemia in chronic kidney disease: Secondary | ICD-10-CM | POA: Diagnosis not present

## 2018-02-04 DIAGNOSIS — D631 Anemia in chronic kidney disease: Secondary | ICD-10-CM | POA: Diagnosis not present

## 2018-02-04 DIAGNOSIS — N186 End stage renal disease: Secondary | ICD-10-CM | POA: Diagnosis not present

## 2018-02-04 DIAGNOSIS — D509 Iron deficiency anemia, unspecified: Secondary | ICD-10-CM | POA: Diagnosis not present

## 2018-02-04 DIAGNOSIS — Z992 Dependence on renal dialysis: Secondary | ICD-10-CM | POA: Diagnosis not present

## 2018-02-05 DIAGNOSIS — N186 End stage renal disease: Secondary | ICD-10-CM | POA: Diagnosis not present

## 2018-02-05 DIAGNOSIS — Z992 Dependence on renal dialysis: Secondary | ICD-10-CM | POA: Diagnosis not present

## 2018-02-05 DIAGNOSIS — D509 Iron deficiency anemia, unspecified: Secondary | ICD-10-CM | POA: Diagnosis not present

## 2018-02-05 DIAGNOSIS — D631 Anemia in chronic kidney disease: Secondary | ICD-10-CM | POA: Diagnosis not present

## 2018-02-06 DIAGNOSIS — D631 Anemia in chronic kidney disease: Secondary | ICD-10-CM | POA: Diagnosis not present

## 2018-02-06 DIAGNOSIS — N186 End stage renal disease: Secondary | ICD-10-CM | POA: Diagnosis not present

## 2018-02-06 DIAGNOSIS — Z992 Dependence on renal dialysis: Secondary | ICD-10-CM | POA: Diagnosis not present

## 2018-02-06 DIAGNOSIS — D509 Iron deficiency anemia, unspecified: Secondary | ICD-10-CM | POA: Diagnosis not present

## 2018-02-07 DIAGNOSIS — N186 End stage renal disease: Secondary | ICD-10-CM | POA: Diagnosis not present

## 2018-02-07 DIAGNOSIS — D509 Iron deficiency anemia, unspecified: Secondary | ICD-10-CM | POA: Diagnosis not present

## 2018-02-07 DIAGNOSIS — Z992 Dependence on renal dialysis: Secondary | ICD-10-CM | POA: Diagnosis not present

## 2018-02-07 DIAGNOSIS — D631 Anemia in chronic kidney disease: Secondary | ICD-10-CM | POA: Diagnosis not present

## 2018-02-08 DIAGNOSIS — D631 Anemia in chronic kidney disease: Secondary | ICD-10-CM | POA: Diagnosis not present

## 2018-02-08 DIAGNOSIS — N186 End stage renal disease: Secondary | ICD-10-CM | POA: Diagnosis not present

## 2018-02-08 DIAGNOSIS — D509 Iron deficiency anemia, unspecified: Secondary | ICD-10-CM | POA: Diagnosis not present

## 2018-02-08 DIAGNOSIS — Z992 Dependence on renal dialysis: Secondary | ICD-10-CM | POA: Diagnosis not present

## 2018-02-09 DIAGNOSIS — Z992 Dependence on renal dialysis: Secondary | ICD-10-CM | POA: Diagnosis not present

## 2018-02-09 DIAGNOSIS — D509 Iron deficiency anemia, unspecified: Secondary | ICD-10-CM | POA: Diagnosis not present

## 2018-02-09 DIAGNOSIS — D631 Anemia in chronic kidney disease: Secondary | ICD-10-CM | POA: Diagnosis not present

## 2018-02-09 DIAGNOSIS — N186 End stage renal disease: Secondary | ICD-10-CM | POA: Diagnosis not present

## 2018-02-10 DIAGNOSIS — N186 End stage renal disease: Secondary | ICD-10-CM | POA: Diagnosis not present

## 2018-02-10 DIAGNOSIS — D509 Iron deficiency anemia, unspecified: Secondary | ICD-10-CM | POA: Diagnosis not present

## 2018-02-10 DIAGNOSIS — D631 Anemia in chronic kidney disease: Secondary | ICD-10-CM | POA: Diagnosis not present

## 2018-02-10 DIAGNOSIS — Z992 Dependence on renal dialysis: Secondary | ICD-10-CM | POA: Diagnosis not present

## 2018-02-11 DIAGNOSIS — N186 End stage renal disease: Secondary | ICD-10-CM | POA: Diagnosis not present

## 2018-02-11 DIAGNOSIS — D509 Iron deficiency anemia, unspecified: Secondary | ICD-10-CM | POA: Diagnosis not present

## 2018-02-11 DIAGNOSIS — Z992 Dependence on renal dialysis: Secondary | ICD-10-CM | POA: Diagnosis not present

## 2018-02-11 DIAGNOSIS — D631 Anemia in chronic kidney disease: Secondary | ICD-10-CM | POA: Diagnosis not present

## 2018-02-12 DIAGNOSIS — N186 End stage renal disease: Secondary | ICD-10-CM | POA: Diagnosis not present

## 2018-02-12 DIAGNOSIS — D631 Anemia in chronic kidney disease: Secondary | ICD-10-CM | POA: Diagnosis not present

## 2018-02-12 DIAGNOSIS — D509 Iron deficiency anemia, unspecified: Secondary | ICD-10-CM | POA: Diagnosis not present

## 2018-02-12 DIAGNOSIS — Z992 Dependence on renal dialysis: Secondary | ICD-10-CM | POA: Diagnosis not present

## 2018-02-13 DIAGNOSIS — Z992 Dependence on renal dialysis: Secondary | ICD-10-CM | POA: Diagnosis not present

## 2018-02-13 DIAGNOSIS — D631 Anemia in chronic kidney disease: Secondary | ICD-10-CM | POA: Diagnosis not present

## 2018-02-13 DIAGNOSIS — D509 Iron deficiency anemia, unspecified: Secondary | ICD-10-CM | POA: Diagnosis not present

## 2018-02-13 DIAGNOSIS — N186 End stage renal disease: Secondary | ICD-10-CM | POA: Diagnosis not present

## 2018-02-14 ENCOUNTER — Other Ambulatory Visit
Admission: RE | Admit: 2018-02-14 | Discharge: 2018-02-14 | Disposition: A | Discharge status: Home / Self Care | Payer: Medicare Other | Source: Ambulatory Visit | Attending: Family Medicine | Admitting: Family Medicine

## 2018-02-14 ENCOUNTER — Encounter: Payer: Self-pay | Admitting: Family Medicine

## 2018-02-14 ENCOUNTER — Ambulatory Visit (INDEPENDENT_AMBULATORY_CARE_PROVIDER_SITE_OTHER): Payer: Medicare Other | Admitting: Family Medicine

## 2018-02-14 VITALS — BP 130/88 | HR 80 | Ht 69.0 in | Wt 264.0 lb

## 2018-02-14 DIAGNOSIS — Z794 Long term (current) use of insulin: Secondary | ICD-10-CM | POA: Insufficient documentation

## 2018-02-14 DIAGNOSIS — E1129 Type 2 diabetes mellitus with other diabetic kidney complication: Secondary | ICD-10-CM

## 2018-02-14 DIAGNOSIS — Z992 Dependence on renal dialysis: Secondary | ICD-10-CM | POA: Diagnosis not present

## 2018-02-14 DIAGNOSIS — R809 Proteinuria, unspecified: Secondary | ICD-10-CM | POA: Diagnosis not present

## 2018-02-14 DIAGNOSIS — N186 End stage renal disease: Secondary | ICD-10-CM | POA: Diagnosis not present

## 2018-02-14 DIAGNOSIS — H6982 Other specified disorders of Eustachian tube, left ear: Secondary | ICD-10-CM

## 2018-02-14 DIAGNOSIS — D631 Anemia in chronic kidney disease: Secondary | ICD-10-CM | POA: Diagnosis not present

## 2018-02-14 DIAGNOSIS — D509 Iron deficiency anemia, unspecified: Secondary | ICD-10-CM | POA: Diagnosis not present

## 2018-02-14 LAB — HEMOGLOBIN A1C
Hgb A1c MFr Bld: 7.7 % — ABNORMAL HIGH (ref 4.8–5.6)
Mean Plasma Glucose: 174.29 mg/dL

## 2018-02-14 MED ORDER — INSULIN REGULAR HUMAN 100 UNIT/ML IJ SOLN: 2.0000 [IU] | Freq: Every day | INTRAMUSCULAR | 4 refills | Status: DC

## 2018-02-14 MED ORDER — GLIPIZIDE 5 MG PO TABS: 5.0000 mg | ORAL_TABLET | Freq: Two times a day (BID) | ORAL | 1 refills | Status: DC

## 2018-02-14 MED ORDER — FLUTICASONE PROPIONATE 50 MCG/ACT NA SUSP: 2.0000 | Freq: Every day | NASAL | 6 refills | Status: DC

## 2018-02-14 NOTE — Progress Notes (Signed)
Name: Jake Gomez   MRN: [id number]    DOB: September 16, 1970   Date:02/14/2018       Progress Note  Subjective  Chief Complaint  Chief Complaint  Patient presents with  . Diabetes    sliding scale starts at 150- if BS is under 150 does he need to take 2 units  . Ear Fullness    "feels like inside ear is moving"    Diabetes  He presents for his follow-up diabetic visit. He has type 2 diabetes mellitus. His disease course has been stable. There are no hypoglycemic associated symptoms. Pertinent negatives for hypoglycemia include no dizziness, headaches or nervousness/anxiousness. Associated symptoms include polydipsia and polyphagia. Pertinent negatives for diabetes include no blurred vision, no chest pain, no fatigue, no foot paresthesias, no foot ulcerations, no polyuria, no visual change, no weakness and no weight loss. There are no hypoglycemic complications. Symptoms are stable. There are no diabetic complications. Risk factors for coronary artery disease include diabetes mellitus, hypertension, male sex and dyslipidemia. Current diabetic treatment includes oral agent (monotherapy) and insulin injections. He is compliant with treatment most of the time. His weight is increasing steadily. He is following a generally healthy diet. Diabetic meal planning: eat alot of chicken. He rarely participates in exercise. His home blood glucose trend is decreasing steadily. His breakfast blood glucose is taken between 8-9 am. His breakfast blood glucose range is generally 130-140 mg/dl. An ACE inhibitor/angiotensin II receptor blocker is being taken. He does not see a podiatrist.Eye exam is not current.  Ear Fullness   There is pain in the left ear. This is a new problem. The current episode started in the past 7 days. The problem has been waxing and waning. There has been no fever. The pain is mild (something crawling in ear). Pertinent negatives include no abdominal pain, coughing, diarrhea, ear discharge,  headaches, neck pain, rash or sore throat.    Diabetes mellitus (HCC) Chronic. Controlled. Continue glipizide and novolin R . Recheck A1C.    Past Medical History:  Diagnosis Date  . Diabetes mellitus without complication ([place])   . Hypertension   . Renal disorder     Past Surgical History:  Procedure Laterality Date  . peritonal dialysis      History reviewed. No pertinent family history.  Social History   Socioeconomic History  . Marital status: Single    Spouse name: Not on file  . Number of children: Not on file  . Years of education: Not on file  . Highest education level: Not on file  Occupational History  . Not on file  Social Needs  . Financial resource strain: Not on file  . Food insecurity:    Worry: Not on file    Inability: Not on file  . Transportation needs:    Medical: Not on file    Non-medical: Not on file  Tobacco Use  . Smoking status: Former Research scientist (life sciences)  . Smokeless tobacco: Former Systems developer    Quit date: 07/16/2009  Substance and Sexual Activity  . Alcohol use: No    Frequency: Never  . Drug use: No  . Sexual activity: Not on file  Lifestyle  . Physical activity:    Days per week: Not on file    Minutes per session: Not on file  . Stress: Not on file  Relationships  . Social connections:    Talks on phone: Not on file    Gets together: Not on file    Attends religious service: Not  on file    Active member of club or organization: Not on file    Attends meetings of clubs or organizations: Not on file    Relationship status: Not on file  . Intimate partner violence:    Fear of current or ex partner: Not on file    Emotionally abused: Not on file    Physically abused: Not on file    Forced sexual activity: Not on file  Other Topics Concern  . Not on file  Social History Narrative  . Not on file    No Known Allergies  Outpatient Medications Prior to Visit  Medication Sig Dispense Refill  . acetaminophen (TYLENOL) 500 MG tablet Take 500  mg by mouth.    Marland Kitchen amLODipine (NORVASC) 5 MG tablet Take 10 mg by mouth 2 (two) times daily.     Marland Kitchen atorvastatin (LIPITOR) 80 MG tablet Take 80 mg by mouth.    . bumetanide (BUMEX) 2 MG tablet Take 2 mg by mouth daily.     . calcium acetate (PHOSLO) 667 MG capsule Take 2,001 mg by mouth 3 (three) times daily with meals.     . Insulin Syringes, Disposable, U-100 1 ML MISC 1 each by Does not apply route daily. 100 each 0  . irbesartan (AVAPRO) 300 MG tablet Take 300 mg by mouth at bedtime.     Marland Kitchen labetalol (NORMODYNE) 200 MG tablet Take 200 mg by mouth 2 (two) times daily.     . traZODone (DESYREL) 50 MG tablet Take 50 mg by mouth at bedtime. Dr Candiss Norse  0  . glipiZIDE (GLUCOTROL) 5 MG tablet Take 1 tablet (5 mg total) by mouth 2 (two) times daily before a meal. 60 tablet 1  . Insulin Pen Needle (PEN NEEDLES 3/16") 31G X 5 MM MISC 2 each by Does not apply route daily. 100 each 0  . insulin regular (NOVOLIN R RELION) 100 units/mL injection Inject 0.02 mLs (2 Units total) into the skin daily. 10 mL 4   No facility-administered medications prior to visit.     Review of Systems  Constitutional: Negative for chills, fatigue, fever, malaise/fatigue and weight loss.  HENT: Negative for ear discharge, ear pain and sore throat.   Eyes: Negative for blurred vision.  Respiratory: Negative for cough, sputum production, shortness of breath and wheezing.   Cardiovascular: Negative for chest pain, palpitations and leg swelling.  Gastrointestinal: Negative for abdominal pain, blood in stool, constipation, diarrhea, heartburn, melena and nausea.  Genitourinary: Negative for dysuria, frequency, hematuria and urgency.  Musculoskeletal: Negative for back pain, joint pain, myalgias and neck pain.  Skin: Negative for rash.  Neurological: Negative for dizziness, tingling, sensory change, focal weakness, weakness and headaches.  Endo/Heme/Allergies: Positive for polydipsia and polyphagia. Negative for environmental  allergies. Does not bruise/bleed easily.  Psychiatric/Behavioral: Negative for depression and suicidal ideas. The patient is not nervous/anxious and does not have insomnia.      Objective  Vitals:   02/14/18 1341  BP: 130/88  Pulse: 80  Weight: 264 lb (119.7 kg)  Height: 5\' 9"  (1.753 m)    Physical Exam  Constitutional: He is oriented to person, place, and time.  HENT:  Head: Normocephalic.  Right Ear: Hearing, tympanic membrane, external ear and ear canal normal.  Left Ear: Hearing, tympanic membrane, external ear and ear canal normal.  Nose: Nose normal.  Mouth/Throat: Oropharynx is clear and moist.  Eyes: Pupils are equal, round, and reactive to light. Conjunctivae and EOM are normal. Right eye  exhibits no discharge. Left eye exhibits no discharge. No scleral icterus.  Neck: Normal range of motion. Neck supple. No JVD present. No tracheal deviation present. No thyromegaly present.  Cardiovascular: Normal rate, regular rhythm, S1 normal, S2 normal, normal heart sounds and intact distal pulses. Exam reveals no gallop, no S3, no S4 and no friction rub.  No murmur heard. Pulses:      Dorsalis pedis pulses are 1+ on the right side, and 1+ on the left side.       Posterior tibial pulses are 1+ on the right side, and 1+ on the left side.  Pulmonary/Chest: Breath sounds normal. No respiratory distress. He has no wheezes. He has no rales.  Abdominal: Soft. Bowel sounds are normal. He exhibits no mass. There is no hepatosplenomegaly. There is no tenderness. There is no rebound, no guarding and no CVA tenderness.  Musculoskeletal: Normal range of motion. He exhibits no edema or tenderness.       Right foot: There is normal range of motion and no deformity.       Left foot: There is normal range of motion and no deformity.  Feet:  Right Foot:  Protective Sensation: 5 sites tested. 10 sites sensed.  Skin Integrity: Negative for ulcer, blister, skin breakdown, erythema, warmth, callus or  dry skin.  Left Foot:  Protective Sensation: 5 sites tested. 10 sites sensed.  Skin Integrity: Negative for ulcer, blister, skin breakdown, erythema, warmth, callus or dry skin.  Lymphadenopathy:    He has no cervical adenopathy.  Neurological: He is alert and oriented to person, place, and time. He has normal strength and normal reflexes. No cranial nerve deficit.  Skin: Skin is warm. No rash noted.  Vitals reviewed.     Assessment & Plan  Problem List Items Addressed This Visit      Endocrine   Diabetes mellitus ([place]) - Primary    Chronic. Controlled. Continue glipizide and novolin R . Recheck A1C.       Relevant Medications   glipiZIDE (GLUCOTROL) 5 MG tablet   insulin regular (NOVOLIN R RELION) 100 units/mL injection   Other Relevant Orders   Hemoglobin A1c    Other Visit Diagnoses    Dysfunction of left eustachian tube       Feels and hears motion in left ear. Exam noted intact TM . Will teat with flonase.   Relevant Medications   fluticasone (FLONASE) 50 MCG/ACT nasal spray      Meds ordered this encounter  Medications  . fluticasone (FLONASE) 50 MCG/ACT nasal spray    Sig: Place 2 sprays into both nostrils daily.    Dispense:  16 g    Refill:  6  . glipiZIDE (GLUCOTROL) 5 MG tablet    Sig: Take 1 tablet (5 mg total) by mouth 2 (two) times daily before a meal.    Dispense:  60 tablet    Refill:  1  . insulin regular (NOVOLIN R RELION) 100 units/mL injection    Sig: Inject 0.02 mLs (2 Units total) into the skin daily.    Dispense:  10 mL    Refill:  4      Dr. Otilio Miu [facility]  02/14/18

## 2018-02-14 NOTE — Assessment & Plan Note (Signed)
Chronic. Controlled. Continue glipizide and novolin R . Recheck A1C.

## 2018-02-15 DIAGNOSIS — N186 End stage renal disease: Secondary | ICD-10-CM | POA: Diagnosis not present

## 2018-02-15 DIAGNOSIS — D509 Iron deficiency anemia, unspecified: Secondary | ICD-10-CM | POA: Diagnosis not present

## 2018-02-15 DIAGNOSIS — D631 Anemia in chronic kidney disease: Secondary | ICD-10-CM | POA: Diagnosis not present

## 2018-02-15 DIAGNOSIS — Z992 Dependence on renal dialysis: Secondary | ICD-10-CM | POA: Diagnosis not present

## 2018-02-16 DIAGNOSIS — D631 Anemia in chronic kidney disease: Secondary | ICD-10-CM | POA: Diagnosis not present

## 2018-02-16 DIAGNOSIS — N186 End stage renal disease: Secondary | ICD-10-CM | POA: Diagnosis not present

## 2018-02-16 DIAGNOSIS — Z992 Dependence on renal dialysis: Secondary | ICD-10-CM | POA: Diagnosis not present

## 2018-02-16 DIAGNOSIS — D509 Iron deficiency anemia, unspecified: Secondary | ICD-10-CM | POA: Diagnosis not present

## 2018-02-17 ENCOUNTER — Encounter (INDEPENDENT_AMBULATORY_CARE_PROVIDER_SITE_OTHER): Payer: Self-pay | Admitting: Vascular Surgery

## 2018-02-17 ENCOUNTER — Encounter (INDEPENDENT_AMBULATORY_CARE_PROVIDER_SITE_OTHER): Payer: Self-pay

## 2018-02-17 ENCOUNTER — Ambulatory Visit (INDEPENDENT_AMBULATORY_CARE_PROVIDER_SITE_OTHER): Payer: Medicare Other | Admitting: Vascular Surgery

## 2018-02-17 VITALS — BP 155/77 | HR 81 | Resp 16 | Ht 69.0 in | Wt 263.6 lb

## 2018-02-17 DIAGNOSIS — Z794 Long term (current) use of insulin: Secondary | ICD-10-CM | POA: Diagnosis not present

## 2018-02-17 DIAGNOSIS — D509 Iron deficiency anemia, unspecified: Secondary | ICD-10-CM | POA: Diagnosis not present

## 2018-02-17 DIAGNOSIS — N186 End stage renal disease: Secondary | ICD-10-CM | POA: Diagnosis not present

## 2018-02-17 DIAGNOSIS — L308 Other specified dermatitis: Secondary | ICD-10-CM | POA: Diagnosis not present

## 2018-02-17 DIAGNOSIS — Z823 Family history of stroke: Secondary | ICD-10-CM | POA: Diagnosis not present

## 2018-02-17 DIAGNOSIS — Z992 Dependence on renal dialysis: Secondary | ICD-10-CM | POA: Diagnosis not present

## 2018-02-17 DIAGNOSIS — I129 Hypertensive chronic kidney disease with stage 1 through stage 4 chronic kidney disease, or unspecified chronic kidney disease: Secondary | ICD-10-CM | POA: Diagnosis not present

## 2018-02-17 DIAGNOSIS — L03317 Cellulitis of buttock: Secondary | ICD-10-CM | POA: Diagnosis not present

## 2018-02-17 DIAGNOSIS — Z79899 Other long term (current) drug therapy: Secondary | ICD-10-CM | POA: Diagnosis not present

## 2018-02-17 DIAGNOSIS — E1122 Type 2 diabetes mellitus with diabetic chronic kidney disease: Secondary | ICD-10-CM | POA: Diagnosis not present

## 2018-02-17 DIAGNOSIS — L309 Dermatitis, unspecified: Secondary | ICD-10-CM | POA: Diagnosis not present

## 2018-02-17 DIAGNOSIS — I159 Secondary hypertension, unspecified: Secondary | ICD-10-CM

## 2018-02-17 DIAGNOSIS — D631 Anemia in chronic kidney disease: Secondary | ICD-10-CM | POA: Diagnosis not present

## 2018-02-17 DIAGNOSIS — Z87891 Personal history of nicotine dependence: Secondary | ICD-10-CM | POA: Diagnosis not present

## 2018-02-17 DIAGNOSIS — N184 Chronic kidney disease, stage 4 (severe): Secondary | ICD-10-CM | POA: Diagnosis not present

## 2018-02-17 NOTE — Assessment & Plan Note (Signed)
blood glucose control important in reducing the progression of atherosclerotic disease. Also, involved in wound healing. On appropriate medications.  

## 2018-02-17 NOTE — Progress Notes (Signed)
MRN : [id number]  Jake Gomez is a 47 y.o. (July 13, 1970) male who presents with chief complaint of  Chief Complaint  Patient presents with  . Follow-up    discuss avf placement  .  History of Present Illness: Patient returns today in follow up of dialysis access.  He has been doing peritoneal dialysis for some time and this is doing okay but not great.  His nephrologist has urged him to consider a fistula placement for hemodialysis as a back-up option.  He is interested in being evaluated for the "new procedure".  He has never had a fistula or graft in his upper extremities.  He has previous vein mapping's that were done somewhat remotely which showed good cephalic veins bilaterally, but he has not had the vein mapping evaluation for the wavelength procedure.  Current Outpatient Medications  Medication Sig Dispense Refill  . acetaminophen (TYLENOL) 500 MG tablet Take 500 mg by mouth.    Marland Kitchen amLODipine (NORVASC) 5 MG tablet Take 10 mg by mouth 2 (two) times daily.     Marland Kitchen atorvastatin (LIPITOR) 80 MG tablet Take 80 mg by mouth.    . bumetanide (BUMEX) 2 MG tablet Take 2 mg by mouth daily.     . calcium acetate (PHOSLO) 667 MG capsule Take 2,001 mg by mouth 3 (three) times daily with meals.     . fluticasone (FLONASE) 50 MCG/ACT nasal spray Place 2 sprays into both nostrils daily. 16 g 6  . glipiZIDE (GLUCOTROL) 5 MG tablet Take 1 tablet (5 mg total) by mouth 2 (two) times daily before a meal. 60 tablet 1  . insulin regular (NOVOLIN R RELION) 100 units/mL injection Inject 0.02 mLs (2 Units total) into the skin daily. 10 mL 4  . Insulin Syringes, Disposable, U-100 1 ML MISC 1 each by Does not apply route daily. 100 each 0  . irbesartan (AVAPRO) 300 MG tablet Take 300 mg by mouth at bedtime.     Marland Kitchen labetalol (NORMODYNE) 200 MG tablet Take 200 mg by mouth 2 (two) times daily.     . traZODone (DESYREL) 50 MG tablet Take 50 mg by mouth at bedtime. Dr Candiss Norse  0   No current facility-administered  medications for this visit.     Past Medical History:  Diagnosis Date  . Diabetes mellitus without complication ([place])   . Hypertension   . Renal disorder     Past Surgical History:  Procedure Laterality Date  . peritonal dialysis      Social History Social History   Tobacco Use  . Smoking status: Former Research scientist (life sciences)  . Smokeless tobacco: Former Systems developer    Quit date: 07/16/2009  Substance Use Topics  . Alcohol use: No    Frequency: Never  . Drug use: No     Family History Family History  Problem Relation Age of Onset  . Thyroid disease Mother   . Stroke Father   . Cancer Father   . Diabetes Father     No Known Allergies   REVIEW OF SYSTEMS (Negative unless checked)  Constitutional: [] Weight loss  [] Fever  [] Chills Cardiac: [] Chest pain   [] Chest pressure   [] Palpitations   [] Shortness of breath when laying flat   [] Shortness of breath at rest   [x] Shortness of breath with exertion. Vascular:  [] Pain in legs with walking   [] Pain in legs at rest   [] Pain in legs when laying flat   [] Claudication   [] Pain in feet when walking  [] Pain in  feet at rest  [] Pain in feet when laying flat   [] History of DVT   [] Phlebitis   [] Swelling in legs   [] Varicose veins   [] Non-healing ulcers Pulmonary:   [] Uses home oxygen   [] Productive cough   [] Hemoptysis   [] Wheeze  [] COPD   [] Asthma Neurologic:  [] Dizziness  [] Blackouts   [] Seizures   [] History of stroke   [] History of TIA  [] Aphasia   [] Temporary blindness   [] Dysphagia   [] Weakness or numbness in arms   [] Weakness or numbness in legs Musculoskeletal:  [x] Arthritis   [] Joint swelling   [] Joint pain   [] Low back pain Hematologic:  [] Easy bruising  [] Easy bleeding   [] Hypercoagulable state   [x] Anemic   Gastrointestinal:  [] Blood in stool   [] Vomiting blood  [] Gastroesophageal reflux/heartburn   [] Abdominal pain Genitourinary:  [x] Chronic kidney disease   [] Difficult urination  [] Frequent urination  [] Burning with urination    [] Hematuria Skin:  [] Rashes   [] Ulcers   [] Wounds Psychological:  [] History of anxiety   []  History of major depression.  Physical Examination  BP (!) 155/77 (BP Location: Right Arm)   Pulse 81   Resp 16   Ht 5\' 9"  (1.753 m)   Wt 263 lb 9.6 oz (119.6 kg)   BMI 38.93 kg/m  Gen:  WD/WN, NAD Head: [place]/AT, No temporalis wasting. Ear/Nose/Throat: Hearing grossly intact, nares w/o erythema or drainage Eyes: Conjunctiva clear. Sclera non-icteric Neck: Supple.  Trachea midline Pulmonary:  Good air movement, no use of accessory muscles.  Cardiac: RRR, no JVD Vascular:  Vessel Right Left  Radial Palpable Palpable                                   Gastrointestinal: soft, non-tender/non-distended.  Peritoneal dialysis catheter in place Musculoskeletal: M/S 5/5 throughout.  No deformity or atrophy.  Trace lower extremity edema. Neurologic: Sensation grossly intact in extremities.  Symmetrical.  Speech is fluent.  Psychiatric: Judgment intact, Mood & affect appropriate for pt's clinical situation. Dermatologic: No rashes or ulcers noted.  No cellulitis or open wounds.       Labs Recent Results (from the past 2160 hour(s))  POCT Glucose (CBG)     Status: Abnormal   Collection Time: 12/29/17 11:30 AM  Result Value Ref Range   POC Glucose 304 (A) 70 - 99 mg/dl  Hemoglobin A1c     Status: Abnormal   Collection Time: 01/17/18  2:31 PM  Result Value Ref Range   Hgb A1c MFr Bld 8.3 (H) 4.8 - 5.6 %    Comment:          Prediabetes: 5.7 - 6.4          Diabetes: >6.4          Glycemic control for adults with diabetes: <7.0    Est. average glucose Bld gHb Est-mCnc 192 mg/dL  Hemoglobin A1c     Status: Abnormal   Collection Time: 02/14/18  2:42 PM  Result Value Ref Range   Hgb A1c MFr Bld 7.7 (H) 4.8 - 5.6 %    Comment: (NOTE) Pre diabetes:          5.7%-6.4% Diabetes:              >6.4% Glycemic control for   <7.0% adults with diabetes    Mean Plasma Glucose 174.29 mg/dL     Comment: Performed at [facility], [address]  [address] [place], [place] [address]    Radiology No results found.  Assessment/Plan  Hypertension Likely an underlying cause of his renal failure and blood pressure control important in reducing the progression of atherosclerotic disease. On appropriate oral medications.   Diabetes mellitus ([place]) blood glucose control important in reducing the progression of atherosclerotic disease. Also, involved in wound healing. On appropriate medications.   ESRD on peritoneal dialysis [facility]) He is currently doing peritoneal dialysis, but that is unlikely to be a permanent option at this point.  Apparently he has had some volume issues and some clearance issues.  His nephrologist wants him evaluated for a fistula for hemodialysis for a back-up option. He has previous vein mapping's that were done somewhat remotely which showed good cephalic veins bilaterally, but he has not had the vein mapping evaluation for the wavelength procedure.  I think we should obtain the vein mapping to evaluate him for a wavelength procedure.  Can be done at his convenience in the near future.    Leotis Pain, MD  02/17/2018 4:51 PM    This note was created with Dragon medical transcription system.  Any errors from dictation are purely unintentional

## 2018-02-17 NOTE — Assessment & Plan Note (Signed)
Likely an underlying cause of his renal failure and blood pressure control important in reducing the progression of atherosclerotic disease. On appropriate oral medications.  

## 2018-02-17 NOTE — Patient Instructions (Signed)
AV Fistula Placement  Arteriovenous (AV) fistula placement is a surgical procedure to create a connection between a blood vessel that carries blood away from your heart (artery) and a blood vessel that returns blood to your heart (vein). The connection is called a fistula. It is often made in the forearm or upper arm.  You may need this procedure if you are getting hemodialysis treatments for kidney disease. An AV fistula makes your vein larger and stronger over several months. This makes the vein a safe and easy spot to insert the needles that are used for hemodialysis.  Tell a health care provider about:  · Any allergies you have.  · All medicines you are taking, including vitamins, herbs, eye drops, creams, and over-the-counter medicines.  · Any problems you or family members have had with anesthetic medicines.  · Any blood disorders you have.  · Any surgeries you have had.  · Any medical conditions you have.  What are the risks?  Generally, this is a safe procedure. However, problems may occur, including:  · Infection.  · Blood clot (thrombosis).  · Reduced blood flow (stenosis).  · Weakening or ballooning out of the fistula (aneurysm).  · Bleeding.  · Allergic reactions to medicines.  · Nerve damage.  · Swelling near the fistula (lymphedema).  · Weakening of your heart (congestive heart failure).  · Failure of the procedure.    What happens before the procedure?  · Imaging tests of your arm may be done to find the best place for the fistula.  · Ask your health care provider about:  ? Changing or stopping your regular medicines. This is especially important if you are taking diabetes medicines or blood thinners.  ? Taking medicines such as aspirin and ibuprofen. These medicines can thin your blood. Do not take these medicines before your procedure if your health care provider instructs you not to.  · Follow instructions from your health care provider about eating or drinking restrictions.  · You may be given  antibiotic medicine to help prevent infection.  · Ask your health care provider how your surgical site will be marked or identified.  · Plan to have someone take you home after the procedure.  What happens during the procedure?  · To reduce your risk of infection:  ? Your health care team will wash or sanitize their hands.  ? Your skin will be washed with soap.  ? Hair may be removed from the surgical area.  · An IV tube will be started in one of your veins.  · You will be given one or more of the following:  ? A medicine to help you relax (sedative).  ? A medicine to numb the area (local anesthetic).  ? A medicine to make you fall asleep (general anesthetic).  ? A medicine that is injected into an area of your body to numb everything below the injection site (regional anesthetic).  · The fistula site will be cleaned with a germ-killing solution (antiseptic).  · A cut (incision) will be made on the inner side of your arm.  · A vein and an artery will be opened and connected with stitches (sutures).  · The incision will be closed with sutures or clips.  · A bandage (dressing) will be placed over the area.  The procedure may vary among health care providers and hospitals.  What happens after the procedure?  · Your blood pressure, heart rate, breathing rate, and blood oxygen level will be   monitored often until the medicines you were given have worn off.  · Your fistula site will be checked for bleeding or swelling.  · You will be given pain medication as needed.  · Do not drive for 24 hours if you received a sedative.  This information is not intended to replace advice given to you by your health care provider. Make sure you discuss any questions you have with your health care provider.  Document Released: 05/12/2015 Document Revised: 11/06/2015 Document Reviewed: 08/21/2014  Elsevier Interactive Patient Education © 2017 [employer]vier Inc[employer]

## 2018-02-17 NOTE — Assessment & Plan Note (Signed)
He is currently doing peritoneal dialysis, but that is unlikely to be a permanent option at this point.  Apparently he has had some volume issues and some clearance issues.  His nephrologist wants him evaluated for a fistula for hemodialysis for a back-up option. He has previous vein mapping's that were done somewhat remotely which showed good cephalic veins bilaterally, but he has not had the vein mapping evaluation for the wavelength procedure.  I think we should obtain the vein mapping to evaluate him for a wavelength procedure.  Can be done at his convenience in the near future.

## 2018-02-18 DIAGNOSIS — Z992 Dependence on renal dialysis: Secondary | ICD-10-CM | POA: Diagnosis not present

## 2018-02-18 DIAGNOSIS — D509 Iron deficiency anemia, unspecified: Secondary | ICD-10-CM | POA: Diagnosis not present

## 2018-02-18 DIAGNOSIS — D631 Anemia in chronic kidney disease: Secondary | ICD-10-CM | POA: Diagnosis not present

## 2018-02-18 DIAGNOSIS — N186 End stage renal disease: Secondary | ICD-10-CM | POA: Diagnosis not present

## 2018-02-19 DIAGNOSIS — Z992 Dependence on renal dialysis: Secondary | ICD-10-CM | POA: Diagnosis not present

## 2018-02-19 DIAGNOSIS — N186 End stage renal disease: Secondary | ICD-10-CM | POA: Diagnosis not present

## 2018-02-19 DIAGNOSIS — D509 Iron deficiency anemia, unspecified: Secondary | ICD-10-CM | POA: Diagnosis not present

## 2018-02-19 DIAGNOSIS — D631 Anemia in chronic kidney disease: Secondary | ICD-10-CM | POA: Diagnosis not present

## 2018-02-20 DIAGNOSIS — Z992 Dependence on renal dialysis: Secondary | ICD-10-CM | POA: Diagnosis not present

## 2018-02-20 DIAGNOSIS — D631 Anemia in chronic kidney disease: Secondary | ICD-10-CM | POA: Diagnosis not present

## 2018-02-20 DIAGNOSIS — D509 Iron deficiency anemia, unspecified: Secondary | ICD-10-CM | POA: Diagnosis not present

## 2018-02-20 DIAGNOSIS — N186 End stage renal disease: Secondary | ICD-10-CM | POA: Diagnosis not present

## 2018-02-21 DIAGNOSIS — N186 End stage renal disease: Secondary | ICD-10-CM | POA: Diagnosis not present

## 2018-02-21 DIAGNOSIS — D631 Anemia in chronic kidney disease: Secondary | ICD-10-CM | POA: Diagnosis not present

## 2018-02-21 DIAGNOSIS — D509 Iron deficiency anemia, unspecified: Secondary | ICD-10-CM | POA: Diagnosis not present

## 2018-02-21 DIAGNOSIS — Z992 Dependence on renal dialysis: Secondary | ICD-10-CM | POA: Diagnosis not present

## 2018-02-22 DIAGNOSIS — N186 End stage renal disease: Secondary | ICD-10-CM | POA: Diagnosis not present

## 2018-02-22 DIAGNOSIS — D509 Iron deficiency anemia, unspecified: Secondary | ICD-10-CM | POA: Diagnosis not present

## 2018-02-22 DIAGNOSIS — D631 Anemia in chronic kidney disease: Secondary | ICD-10-CM | POA: Diagnosis not present

## 2018-02-22 DIAGNOSIS — Z992 Dependence on renal dialysis: Secondary | ICD-10-CM | POA: Diagnosis not present

## 2018-02-23 DIAGNOSIS — D631 Anemia in chronic kidney disease: Secondary | ICD-10-CM | POA: Diagnosis not present

## 2018-02-23 DIAGNOSIS — N186 End stage renal disease: Secondary | ICD-10-CM | POA: Diagnosis not present

## 2018-02-23 DIAGNOSIS — D509 Iron deficiency anemia, unspecified: Secondary | ICD-10-CM | POA: Diagnosis not present

## 2018-02-23 DIAGNOSIS — Z992 Dependence on renal dialysis: Secondary | ICD-10-CM | POA: Diagnosis not present

## 2018-02-24 DIAGNOSIS — Z992 Dependence on renal dialysis: Secondary | ICD-10-CM | POA: Diagnosis not present

## 2018-02-24 DIAGNOSIS — D509 Iron deficiency anemia, unspecified: Secondary | ICD-10-CM | POA: Diagnosis not present

## 2018-02-24 DIAGNOSIS — N186 End stage renal disease: Secondary | ICD-10-CM | POA: Diagnosis not present

## 2018-02-24 DIAGNOSIS — D631 Anemia in chronic kidney disease: Secondary | ICD-10-CM | POA: Diagnosis not present

## 2018-02-25 DIAGNOSIS — N186 End stage renal disease: Secondary | ICD-10-CM | POA: Diagnosis not present

## 2018-02-25 DIAGNOSIS — D509 Iron deficiency anemia, unspecified: Secondary | ICD-10-CM | POA: Diagnosis not present

## 2018-02-25 DIAGNOSIS — Z992 Dependence on renal dialysis: Secondary | ICD-10-CM | POA: Diagnosis not present

## 2018-02-25 DIAGNOSIS — D631 Anemia in chronic kidney disease: Secondary | ICD-10-CM | POA: Diagnosis not present

## 2018-02-26 DIAGNOSIS — Z992 Dependence on renal dialysis: Secondary | ICD-10-CM | POA: Diagnosis not present

## 2018-02-26 DIAGNOSIS — N186 End stage renal disease: Secondary | ICD-10-CM | POA: Diagnosis not present

## 2018-02-26 DIAGNOSIS — D631 Anemia in chronic kidney disease: Secondary | ICD-10-CM | POA: Diagnosis not present

## 2018-02-26 DIAGNOSIS — D509 Iron deficiency anemia, unspecified: Secondary | ICD-10-CM | POA: Diagnosis not present

## 2018-02-27 DIAGNOSIS — Z992 Dependence on renal dialysis: Secondary | ICD-10-CM | POA: Diagnosis not present

## 2018-02-27 DIAGNOSIS — D509 Iron deficiency anemia, unspecified: Secondary | ICD-10-CM | POA: Diagnosis not present

## 2018-02-27 DIAGNOSIS — D631 Anemia in chronic kidney disease: Secondary | ICD-10-CM | POA: Diagnosis not present

## 2018-02-27 DIAGNOSIS — N186 End stage renal disease: Secondary | ICD-10-CM | POA: Diagnosis not present

## 2018-02-28 DIAGNOSIS — D631 Anemia in chronic kidney disease: Secondary | ICD-10-CM | POA: Diagnosis not present

## 2018-02-28 DIAGNOSIS — N186 End stage renal disease: Secondary | ICD-10-CM | POA: Diagnosis not present

## 2018-02-28 DIAGNOSIS — D509 Iron deficiency anemia, unspecified: Secondary | ICD-10-CM | POA: Diagnosis not present

## 2018-02-28 DIAGNOSIS — Z992 Dependence on renal dialysis: Secondary | ICD-10-CM | POA: Diagnosis not present

## 2018-03-01 DIAGNOSIS — D509 Iron deficiency anemia, unspecified: Secondary | ICD-10-CM | POA: Diagnosis not present

## 2018-03-01 DIAGNOSIS — D631 Anemia in chronic kidney disease: Secondary | ICD-10-CM | POA: Diagnosis not present

## 2018-03-01 DIAGNOSIS — N186 End stage renal disease: Secondary | ICD-10-CM | POA: Diagnosis not present

## 2018-03-01 DIAGNOSIS — Z992 Dependence on renal dialysis: Secondary | ICD-10-CM | POA: Diagnosis not present

## 2018-03-02 DIAGNOSIS — D509 Iron deficiency anemia, unspecified: Secondary | ICD-10-CM | POA: Diagnosis not present

## 2018-03-02 DIAGNOSIS — D631 Anemia in chronic kidney disease: Secondary | ICD-10-CM | POA: Diagnosis not present

## 2018-03-02 DIAGNOSIS — Z992 Dependence on renal dialysis: Secondary | ICD-10-CM | POA: Diagnosis not present

## 2018-03-02 DIAGNOSIS — N186 End stage renal disease: Secondary | ICD-10-CM | POA: Diagnosis not present

## 2018-03-03 ENCOUNTER — Encounter (INDEPENDENT_AMBULATORY_CARE_PROVIDER_SITE_OTHER): Payer: Medicare Other

## 2018-03-03 ENCOUNTER — Ambulatory Visit (INDEPENDENT_AMBULATORY_CARE_PROVIDER_SITE_OTHER): Payer: Medicare Other | Admitting: Vascular Surgery

## 2018-03-03 ENCOUNTER — Other Ambulatory Visit (INDEPENDENT_AMBULATORY_CARE_PROVIDER_SITE_OTHER): Payer: Medicare Other

## 2018-03-03 DIAGNOSIS — D631 Anemia in chronic kidney disease: Secondary | ICD-10-CM | POA: Diagnosis not present

## 2018-03-03 DIAGNOSIS — N186 End stage renal disease: Secondary | ICD-10-CM | POA: Diagnosis not present

## 2018-03-03 DIAGNOSIS — D509 Iron deficiency anemia, unspecified: Secondary | ICD-10-CM | POA: Diagnosis not present

## 2018-03-03 DIAGNOSIS — Z992 Dependence on renal dialysis: Secondary | ICD-10-CM | POA: Diagnosis not present

## 2018-03-04 DIAGNOSIS — N186 End stage renal disease: Secondary | ICD-10-CM | POA: Diagnosis not present

## 2018-03-04 DIAGNOSIS — Z992 Dependence on renal dialysis: Secondary | ICD-10-CM | POA: Diagnosis not present

## 2018-03-04 DIAGNOSIS — D631 Anemia in chronic kidney disease: Secondary | ICD-10-CM | POA: Diagnosis not present

## 2018-03-04 DIAGNOSIS — D509 Iron deficiency anemia, unspecified: Secondary | ICD-10-CM | POA: Diagnosis not present

## 2018-03-05 DIAGNOSIS — D509 Iron deficiency anemia, unspecified: Secondary | ICD-10-CM | POA: Diagnosis not present

## 2018-03-05 DIAGNOSIS — N186 End stage renal disease: Secondary | ICD-10-CM | POA: Diagnosis not present

## 2018-03-05 DIAGNOSIS — Z992 Dependence on renal dialysis: Secondary | ICD-10-CM | POA: Diagnosis not present

## 2018-03-05 DIAGNOSIS — D631 Anemia in chronic kidney disease: Secondary | ICD-10-CM | POA: Diagnosis not present

## 2018-03-06 DIAGNOSIS — Z992 Dependence on renal dialysis: Secondary | ICD-10-CM | POA: Diagnosis not present

## 2018-03-06 DIAGNOSIS — N186 End stage renal disease: Secondary | ICD-10-CM | POA: Diagnosis not present

## 2018-03-06 DIAGNOSIS — D631 Anemia in chronic kidney disease: Secondary | ICD-10-CM | POA: Diagnosis not present

## 2018-03-06 DIAGNOSIS — D509 Iron deficiency anemia, unspecified: Secondary | ICD-10-CM | POA: Diagnosis not present

## 2018-03-07 DIAGNOSIS — N186 End stage renal disease: Secondary | ICD-10-CM | POA: Diagnosis not present

## 2018-03-07 DIAGNOSIS — D631 Anemia in chronic kidney disease: Secondary | ICD-10-CM | POA: Diagnosis not present

## 2018-03-07 DIAGNOSIS — Z992 Dependence on renal dialysis: Secondary | ICD-10-CM | POA: Diagnosis not present

## 2018-03-07 DIAGNOSIS — D509 Iron deficiency anemia, unspecified: Secondary | ICD-10-CM | POA: Diagnosis not present

## 2018-03-08 DIAGNOSIS — D509 Iron deficiency anemia, unspecified: Secondary | ICD-10-CM | POA: Diagnosis not present

## 2018-03-08 DIAGNOSIS — Z992 Dependence on renal dialysis: Secondary | ICD-10-CM | POA: Diagnosis not present

## 2018-03-08 DIAGNOSIS — D631 Anemia in chronic kidney disease: Secondary | ICD-10-CM | POA: Diagnosis not present

## 2018-03-08 DIAGNOSIS — N186 End stage renal disease: Secondary | ICD-10-CM | POA: Diagnosis not present

## 2018-03-09 ENCOUNTER — Ambulatory Visit (INDEPENDENT_AMBULATORY_CARE_PROVIDER_SITE_OTHER): Payer: Medicare Other | Admitting: Vascular Surgery

## 2018-03-09 ENCOUNTER — Encounter (INDEPENDENT_AMBULATORY_CARE_PROVIDER_SITE_OTHER): Payer: Self-pay | Admitting: Vascular Surgery

## 2018-03-09 VITALS — BP 185/79 | HR 82 | Resp 17 | Ht 70.0 in | Wt 263.0 lb

## 2018-03-09 DIAGNOSIS — N186 End stage renal disease: Secondary | ICD-10-CM

## 2018-03-09 DIAGNOSIS — E1122 Type 2 diabetes mellitus with diabetic chronic kidney disease: Secondary | ICD-10-CM | POA: Diagnosis not present

## 2018-03-09 DIAGNOSIS — D631 Anemia in chronic kidney disease: Secondary | ICD-10-CM | POA: Diagnosis not present

## 2018-03-09 DIAGNOSIS — I159 Secondary hypertension, unspecified: Secondary | ICD-10-CM | POA: Diagnosis not present

## 2018-03-09 DIAGNOSIS — D509 Iron deficiency anemia, unspecified: Secondary | ICD-10-CM | POA: Diagnosis not present

## 2018-03-09 DIAGNOSIS — Z992 Dependence on renal dialysis: Secondary | ICD-10-CM | POA: Diagnosis not present

## 2018-03-09 DIAGNOSIS — Z794 Long term (current) use of insulin: Secondary | ICD-10-CM | POA: Diagnosis not present

## 2018-03-10 ENCOUNTER — Encounter (INDEPENDENT_AMBULATORY_CARE_PROVIDER_SITE_OTHER): Payer: Self-pay | Admitting: Vascular Surgery

## 2018-03-10 DIAGNOSIS — Z992 Dependence on renal dialysis: Secondary | ICD-10-CM | POA: Diagnosis not present

## 2018-03-10 DIAGNOSIS — D509 Iron deficiency anemia, unspecified: Secondary | ICD-10-CM | POA: Diagnosis not present

## 2018-03-10 DIAGNOSIS — D631 Anemia in chronic kidney disease: Secondary | ICD-10-CM | POA: Diagnosis not present

## 2018-03-10 DIAGNOSIS — N186 End stage renal disease: Secondary | ICD-10-CM | POA: Diagnosis not present

## 2018-03-10 NOTE — Progress Notes (Signed)
MRN : [id number]  Jake Gomez is a 47 y.o. (1970/11/15) male who presents with chief complaint of  Chief Complaint  Patient presents with  . Follow-up    Vein mapping results  .  History of Present Illness:  The patient returns to the office for followup status post multiple revisions of his PD dialysis access, the most recent of which was 05/03/2014. Following the intervention the access function has significantly improved, with better flow rates. The patient has not been experiencing drainage problems or abdominal pain.  At the present time the patient denies fever or chills no missed runs.  The patient denies amaurosis fugax or recent TIA symptoms. There are no recent neurological changes noted.  The patient denies claudication symptoms or rest pain symptoms.  The patient denies history of DVT, PE or superficial thrombophlebitis. The patient denies recent episodes of angina or shortness of breath.   Vein mapping shows an acceptable patter for [facility] fistula creation in the left arm the right arm does not have a perforator.  He is right handed  Current Meds  Medication Sig  . acetaminophen (TYLENOL) 500 MG tablet Take 500 mg by mouth.  Marland Kitchen amLODipine (NORVASC) 5 MG tablet Take 10 mg by mouth 2 (two) times daily.   Marland Kitchen atorvastatin (LIPITOR) 80 MG tablet Take 80 mg by mouth.  . bumetanide (BUMEX) 2 MG tablet Take 2 mg by mouth daily.   . calcium acetate (PHOSLO) 667 MG capsule Take 2,001 mg by mouth 3 (three) times daily with meals.   . carvedilol (COREG) 6.25 MG tablet Take 6.25 mg by mouth daily.  . fluticasone (FLONASE) 50 MCG/ACT nasal spray Place 2 sprays into both nostrils daily.  Marland Kitchen glipiZIDE (GLUCOTROL) 5 MG tablet Take 1 tablet (5 mg total) by mouth 2 (two) times daily before a meal.  . insulin regular (NOVOLIN R RELION) 100 units/mL injection Inject 0.02 mLs (2 Units total) into the skin daily.  . Insulin Syringes, Disposable, U-100 1 ML MISC 1 each by Does not apply  route daily.  . irbesartan (AVAPRO) 300 MG tablet Take 300 mg by mouth at bedtime.   Marland Kitchen labetalol (NORMODYNE) 200 MG tablet Take 200 mg by mouth 2 (two) times daily.   Marland Kitchen RELION INSULIN SYRINGE 1ML/31G 31G X 5/16" 1 ML MISC 1 EACH BY DOSE NOT APPLY ROUTE DAILY  . traZODone (DESYREL) 50 MG tablet Take 50 mg by mouth at bedtime. Dr Candiss Norse    Past Medical History:  Diagnosis Date  . Diabetes mellitus without complication ([place])   . Hypertension   . Renal disorder     Past Surgical History:  Procedure Laterality Date  . peritonal dialysis      Social History Social History   Tobacco Use  . Smoking status: Former Research scientist (life sciences)  . Smokeless tobacco: Former Systems developer    Quit date: 07/16/2009  Substance Use Topics  . Alcohol use: No    Frequency: Never  . Drug use: No    Family History Family History  Problem Relation Age of Onset  . Thyroid disease Mother   . Stroke Father   . Cancer Father   . Diabetes Father     No Known Allergies   REVIEW OF SYSTEMS (Negative unless checked)  Constitutional: [] Weight loss  [] Fever  [] Chills Cardiac: [] Chest pain   [] Chest pressure   [] Palpitations   [] Shortness of breath when laying flat   [] Shortness of breath with exertion. Vascular:  [] Pain in legs with walking   []   Pain in legs at rest  [] History of DVT   [] Phlebitis   [] Swelling in legs   [] Varicose veins   [] Non-healing ulcers Pulmonary:   [] Uses home oxygen   [] Productive cough   [] Hemoptysis   [] Wheeze  [] COPD   [] Asthma Neurologic:  [] Dizziness   [] Seizures   [] History of stroke   [] History of TIA  [] Aphasia   [] Vissual changes   [] Weakness or numbness in arm   [] Weakness or numbness in leg Musculoskeletal:   [] Joint swelling   [] Joint pain   [] Low back pain Hematologic:  [] Easy bruising  [] Easy bleeding   [] Hypercoagulable state   [] Anemic Gastrointestinal:  [] Diarrhea   [] Vomiting  [] Gastroesophageal reflux/heartburn   [] Difficulty swallowing. Genitourinary:  [x] Chronic kidney disease    [] Difficult urination  [] Frequent urination   [] Blood in urine Skin:  [] Rashes   [] Ulcers  Psychological:  [] History of anxiety   []  History of major depression.  Physical Examination  Vitals:   03/09/18 1552  BP: (!) 185/79  Pulse: 82  Resp: 17  Weight: 263 lb (119.3 kg)  Height: 5\' 10"  (1.778 m)   Body mass index is 37.74 kg/m. Gen: WD/WN, NAD Head: /AT, No temporalis wasting.  Ear/Nose/Throat: Hearing grossly intact, nares w/o erythema or drainage Eyes: PER, EOMI, sclera nonicteric.  Neck: Supple, no large masses.   Pulmonary:  Good air movement, no audible wheezing bilaterally, no use of accessory muscles.  Cardiac: RRR, no JVD Vascular: PD catheter CD&I Vessel Right Left  Radial Palpable Palpable  Brachial Palpable Palpable  Gastrointestinal: Non-distended. No guarding/no peritoneal signs.  Musculoskeletal: M/S 5/5 throughout.  No deformity or atrophy.  Neurologic: CN 2-12 intact. Symmetrical.  Speech is fluent. Motor exam as listed above. Psychiatric: Judgment intact, Mood & affect appropriate for pt's clinical situation. Dermatologic: No rashes or ulcers noted.  No changes consistent with cellulitis. Lymph : No lichenification or skin changes of chronic lymphedema.  CBC Lab Results  Component Value Date   WBC 5.3 08/05/2017   HGB 7.9 (L) 08/05/2017   HCT 23.2 (L) 08/05/2017   MCV 96.7 08/05/2017   PLT 212 08/05/2017    BMET    Component Value Date/Time   NA 137 08/05/2017 1511   NA 142 04/30/2014 1244   K 5.9 (H) 08/05/2017 1511   K 4.3 04/30/2014 1244   CL 94 (L) 08/05/2017 1511   CL 109 (H) 04/30/2014 1244   CO2 26 08/05/2017 1511   CO2 25 04/30/2014 1244   GLUCOSE 163 (H) 08/05/2017 1511   GLUCOSE 94 04/30/2014 1244   BUN 90 (H) 08/05/2017 1511   BUN 61 (H) 04/30/2014 1244   CREATININE 16.93 (H) 08/05/2017 1511   CREATININE 4.40 (H) 04/30/2014 1244   CALCIUM 8.5 (L) 08/05/2017 1511   CALCIUM 7.8 (L) 04/30/2014 1244   GFRNONAA 3 (L) 08/05/2017  1511   GFRNONAA 16 (L) 04/30/2014 1244   GFRAA 3 (L) 08/05/2017 1511   GFRAA 19 (L) 04/30/2014 1244   CrCl cannot be calculated (Patient's most recent lab result is older than the maximum 21 days allowed.).  COAG No results found for: INR, PROTIME  Radiology No results found.   Assessment/Plan 1. ESRD on peritoneal dialysis [facility]) Recommend:  At this time the patient does not have appropriate extremity access for dialysis that could be used at time the PD catheter is malfunctioning.  Patient should have a left arm [place] fistula created.  The risks, benefits and alternative therapies were reviewed in detail with the patient.  All questions were answered.  The patient agrees to proceed with surgery.    A total of 35 minutes was spent with this patient and greater than 50% was spent in counseling and coordination of care with the patient.  Discussion included the treatment options for vascular disease including indications for surgery and intervention.  Also discussed is the appropriate timing of treatment.  In addition medical therapy was discussed.  2. Type 2 diabetes mellitus with chronic kidney disease on chronic dialysis, with long-term current use of insulin (HCC) Continue hypoglycemic medications as already ordered, these medications have been reviewed and there are no changes at this time.  Hgb A1C to be monitored as already arranged by primary service   3. Secondary hypertension Continue antihypertensive medications as already ordered, these medications have been reviewed and there are no changes at this time.     Hortencia Pilar, MD  03/10/2018 7:58 AM

## 2018-03-11 DIAGNOSIS — N186 End stage renal disease: Secondary | ICD-10-CM | POA: Diagnosis not present

## 2018-03-11 DIAGNOSIS — D509 Iron deficiency anemia, unspecified: Secondary | ICD-10-CM | POA: Diagnosis not present

## 2018-03-11 DIAGNOSIS — D631 Anemia in chronic kidney disease: Secondary | ICD-10-CM | POA: Diagnosis not present

## 2018-03-11 DIAGNOSIS — Z992 Dependence on renal dialysis: Secondary | ICD-10-CM | POA: Diagnosis not present

## 2018-03-12 DIAGNOSIS — D631 Anemia in chronic kidney disease: Secondary | ICD-10-CM | POA: Diagnosis not present

## 2018-03-12 DIAGNOSIS — D509 Iron deficiency anemia, unspecified: Secondary | ICD-10-CM | POA: Diagnosis not present

## 2018-03-12 DIAGNOSIS — N186 End stage renal disease: Secondary | ICD-10-CM | POA: Diagnosis not present

## 2018-03-12 DIAGNOSIS — Z992 Dependence on renal dialysis: Secondary | ICD-10-CM | POA: Diagnosis not present

## 2018-03-13 ENCOUNTER — Telehealth: Payer: Self-pay | Admitting: Cardiovascular Disease

## 2018-03-13 DIAGNOSIS — D631 Anemia in chronic kidney disease: Secondary | ICD-10-CM | POA: Diagnosis not present

## 2018-03-13 DIAGNOSIS — D509 Iron deficiency anemia, unspecified: Secondary | ICD-10-CM | POA: Diagnosis not present

## 2018-03-13 DIAGNOSIS — Z992 Dependence on renal dialysis: Secondary | ICD-10-CM | POA: Diagnosis not present

## 2018-03-13 DIAGNOSIS — N186 End stage renal disease: Secondary | ICD-10-CM | POA: Diagnosis not present

## 2018-03-13 NOTE — Telephone Encounter (Signed)
Patient called and offered an earlier appointment but stated that 05/09/18 was fine.

## 2018-03-13 NOTE — Telephone Encounter (Signed)
° °  [facility] Pre-operative Risk Assessment    Request for surgical clearance:  1. What type of surgery is being performed? LT Arm Wave linq   2. When is this surgery scheduled? TBA   3. What type of clearance is required (medical clearance vs. Pharmacy clearance to hold med vs. Both)? Both   4. Are there any medications that need to be held prior to surgery and how long?   5. Practice name and name of physician performing surgery? [facility] Jake Gomez   6. What is your office phone number [phone]   7.   What is your office fax number [phone]   8.   Anesthesia type (None, local, MAC, general) ?    This is a  new patient scheduled to see Jake Gomez on 05/09/18

## 2018-03-14 DIAGNOSIS — E119 Type 2 diabetes mellitus without complications: Secondary | ICD-10-CM | POA: Diagnosis not present

## 2018-03-14 DIAGNOSIS — Z992 Dependence on renal dialysis: Secondary | ICD-10-CM | POA: Diagnosis not present

## 2018-03-14 DIAGNOSIS — N2581 Secondary hyperparathyroidism of renal origin: Secondary | ICD-10-CM | POA: Diagnosis not present

## 2018-03-14 DIAGNOSIS — N186 End stage renal disease: Secondary | ICD-10-CM | POA: Diagnosis not present

## 2018-03-14 DIAGNOSIS — E781 Pure hyperglyceridemia: Secondary | ICD-10-CM | POA: Diagnosis not present

## 2018-03-14 DIAGNOSIS — D631 Anemia in chronic kidney disease: Secondary | ICD-10-CM | POA: Diagnosis not present

## 2018-03-14 DIAGNOSIS — D509 Iron deficiency anemia, unspecified: Secondary | ICD-10-CM | POA: Diagnosis not present

## 2018-03-14 DIAGNOSIS — Z23 Encounter for immunization: Secondary | ICD-10-CM | POA: Diagnosis not present

## 2018-03-15 DIAGNOSIS — Z23 Encounter for immunization: Secondary | ICD-10-CM | POA: Diagnosis not present

## 2018-03-15 DIAGNOSIS — N186 End stage renal disease: Secondary | ICD-10-CM | POA: Diagnosis not present

## 2018-03-15 DIAGNOSIS — Z992 Dependence on renal dialysis: Secondary | ICD-10-CM | POA: Diagnosis not present

## 2018-03-15 DIAGNOSIS — N2581 Secondary hyperparathyroidism of renal origin: Secondary | ICD-10-CM | POA: Diagnosis not present

## 2018-03-15 DIAGNOSIS — D509 Iron deficiency anemia, unspecified: Secondary | ICD-10-CM | POA: Diagnosis not present

## 2018-03-15 DIAGNOSIS — D631 Anemia in chronic kidney disease: Secondary | ICD-10-CM | POA: Diagnosis not present

## 2018-03-16 DIAGNOSIS — D631 Anemia in chronic kidney disease: Secondary | ICD-10-CM | POA: Diagnosis not present

## 2018-03-16 DIAGNOSIS — N186 End stage renal disease: Secondary | ICD-10-CM | POA: Diagnosis not present

## 2018-03-16 DIAGNOSIS — N2581 Secondary hyperparathyroidism of renal origin: Secondary | ICD-10-CM | POA: Diagnosis not present

## 2018-03-16 DIAGNOSIS — Z23 Encounter for immunization: Secondary | ICD-10-CM | POA: Diagnosis not present

## 2018-03-16 DIAGNOSIS — D509 Iron deficiency anemia, unspecified: Secondary | ICD-10-CM | POA: Diagnosis not present

## 2018-03-16 DIAGNOSIS — Z992 Dependence on renal dialysis: Secondary | ICD-10-CM | POA: Diagnosis not present

## 2018-03-17 DIAGNOSIS — D509 Iron deficiency anemia, unspecified: Secondary | ICD-10-CM | POA: Diagnosis not present

## 2018-03-17 DIAGNOSIS — Z23 Encounter for immunization: Secondary | ICD-10-CM | POA: Diagnosis not present

## 2018-03-17 DIAGNOSIS — Z992 Dependence on renal dialysis: Secondary | ICD-10-CM | POA: Diagnosis not present

## 2018-03-17 DIAGNOSIS — N186 End stage renal disease: Secondary | ICD-10-CM | POA: Diagnosis not present

## 2018-03-17 DIAGNOSIS — N2581 Secondary hyperparathyroidism of renal origin: Secondary | ICD-10-CM | POA: Diagnosis not present

## 2018-03-17 DIAGNOSIS — D631 Anemia in chronic kidney disease: Secondary | ICD-10-CM | POA: Diagnosis not present

## 2018-03-18 DIAGNOSIS — N2581 Secondary hyperparathyroidism of renal origin: Secondary | ICD-10-CM | POA: Diagnosis not present

## 2018-03-18 DIAGNOSIS — D631 Anemia in chronic kidney disease: Secondary | ICD-10-CM | POA: Diagnosis not present

## 2018-03-18 DIAGNOSIS — N186 End stage renal disease: Secondary | ICD-10-CM | POA: Diagnosis not present

## 2018-03-18 DIAGNOSIS — Z992 Dependence on renal dialysis: Secondary | ICD-10-CM | POA: Diagnosis not present

## 2018-03-18 DIAGNOSIS — D509 Iron deficiency anemia, unspecified: Secondary | ICD-10-CM | POA: Diagnosis not present

## 2018-03-18 DIAGNOSIS — Z23 Encounter for immunization: Secondary | ICD-10-CM | POA: Diagnosis not present

## 2018-03-19 DIAGNOSIS — L539 Erythematous condition, unspecified: Secondary | ICD-10-CM | POA: Diagnosis not present

## 2018-03-19 DIAGNOSIS — E1122 Type 2 diabetes mellitus with diabetic chronic kidney disease: Secondary | ICD-10-CM | POA: Diagnosis not present

## 2018-03-19 DIAGNOSIS — M533 Sacrococcygeal disorders, not elsewhere classified: Secondary | ICD-10-CM | POA: Diagnosis not present

## 2018-03-19 DIAGNOSIS — L0231 Cutaneous abscess of buttock: Secondary | ICD-10-CM | POA: Diagnosis not present

## 2018-03-19 DIAGNOSIS — D631 Anemia in chronic kidney disease: Secondary | ICD-10-CM | POA: Diagnosis not present

## 2018-03-19 DIAGNOSIS — Z87891 Personal history of nicotine dependence: Secondary | ICD-10-CM | POA: Diagnosis not present

## 2018-03-19 DIAGNOSIS — Z6838 Body mass index (BMI) 38.0-38.9, adult: Secondary | ICD-10-CM | POA: Diagnosis not present

## 2018-03-19 DIAGNOSIS — D509 Iron deficiency anemia, unspecified: Secondary | ICD-10-CM | POA: Diagnosis not present

## 2018-03-19 DIAGNOSIS — N186 End stage renal disease: Secondary | ICD-10-CM | POA: Diagnosis not present

## 2018-03-19 DIAGNOSIS — Z23 Encounter for immunization: Secondary | ICD-10-CM | POA: Diagnosis not present

## 2018-03-19 DIAGNOSIS — I129 Hypertensive chronic kidney disease with stage 1 through stage 4 chronic kidney disease, or unspecified chronic kidney disease: Secondary | ICD-10-CM | POA: Diagnosis not present

## 2018-03-19 DIAGNOSIS — Z992 Dependence on renal dialysis: Secondary | ICD-10-CM | POA: Diagnosis not present

## 2018-03-19 DIAGNOSIS — N184 Chronic kidney disease, stage 4 (severe): Secondary | ICD-10-CM | POA: Diagnosis not present

## 2018-03-19 DIAGNOSIS — N2581 Secondary hyperparathyroidism of renal origin: Secondary | ICD-10-CM | POA: Diagnosis not present

## 2018-03-20 DIAGNOSIS — D631 Anemia in chronic kidney disease: Secondary | ICD-10-CM | POA: Diagnosis not present

## 2018-03-20 DIAGNOSIS — N186 End stage renal disease: Secondary | ICD-10-CM | POA: Diagnosis not present

## 2018-03-20 DIAGNOSIS — D509 Iron deficiency anemia, unspecified: Secondary | ICD-10-CM | POA: Diagnosis not present

## 2018-03-20 DIAGNOSIS — Z23 Encounter for immunization: Secondary | ICD-10-CM | POA: Diagnosis not present

## 2018-03-20 DIAGNOSIS — Z992 Dependence on renal dialysis: Secondary | ICD-10-CM | POA: Diagnosis not present

## 2018-03-20 DIAGNOSIS — N2581 Secondary hyperparathyroidism of renal origin: Secondary | ICD-10-CM | POA: Diagnosis not present

## 2018-03-21 DIAGNOSIS — D631 Anemia in chronic kidney disease: Secondary | ICD-10-CM | POA: Diagnosis not present

## 2018-03-21 DIAGNOSIS — N2581 Secondary hyperparathyroidism of renal origin: Secondary | ICD-10-CM | POA: Diagnosis not present

## 2018-03-21 DIAGNOSIS — N186 End stage renal disease: Secondary | ICD-10-CM | POA: Diagnosis not present

## 2018-03-21 DIAGNOSIS — Z992 Dependence on renal dialysis: Secondary | ICD-10-CM | POA: Diagnosis not present

## 2018-03-21 DIAGNOSIS — D509 Iron deficiency anemia, unspecified: Secondary | ICD-10-CM | POA: Diagnosis not present

## 2018-03-21 DIAGNOSIS — Z23 Encounter for immunization: Secondary | ICD-10-CM | POA: Diagnosis not present

## 2018-03-22 DIAGNOSIS — D509 Iron deficiency anemia, unspecified: Secondary | ICD-10-CM | POA: Diagnosis not present

## 2018-03-22 DIAGNOSIS — Z992 Dependence on renal dialysis: Secondary | ICD-10-CM | POA: Diagnosis not present

## 2018-03-22 DIAGNOSIS — N2581 Secondary hyperparathyroidism of renal origin: Secondary | ICD-10-CM | POA: Diagnosis not present

## 2018-03-22 DIAGNOSIS — N186 End stage renal disease: Secondary | ICD-10-CM | POA: Diagnosis not present

## 2018-03-22 DIAGNOSIS — D631 Anemia in chronic kidney disease: Secondary | ICD-10-CM | POA: Diagnosis not present

## 2018-03-22 DIAGNOSIS — Z23 Encounter for immunization: Secondary | ICD-10-CM | POA: Diagnosis not present

## 2018-03-23 DIAGNOSIS — N2581 Secondary hyperparathyroidism of renal origin: Secondary | ICD-10-CM | POA: Diagnosis not present

## 2018-03-23 DIAGNOSIS — D509 Iron deficiency anemia, unspecified: Secondary | ICD-10-CM | POA: Diagnosis not present

## 2018-03-23 DIAGNOSIS — N186 End stage renal disease: Secondary | ICD-10-CM | POA: Diagnosis not present

## 2018-03-23 DIAGNOSIS — D631 Anemia in chronic kidney disease: Secondary | ICD-10-CM | POA: Diagnosis not present

## 2018-03-23 DIAGNOSIS — Z992 Dependence on renal dialysis: Secondary | ICD-10-CM | POA: Diagnosis not present

## 2018-03-23 DIAGNOSIS — Z23 Encounter for immunization: Secondary | ICD-10-CM | POA: Diagnosis not present

## 2018-03-24 DIAGNOSIS — D509 Iron deficiency anemia, unspecified: Secondary | ICD-10-CM | POA: Diagnosis not present

## 2018-03-24 DIAGNOSIS — Z992 Dependence on renal dialysis: Secondary | ICD-10-CM | POA: Diagnosis not present

## 2018-03-24 DIAGNOSIS — N186 End stage renal disease: Secondary | ICD-10-CM | POA: Diagnosis not present

## 2018-03-24 DIAGNOSIS — D631 Anemia in chronic kidney disease: Secondary | ICD-10-CM | POA: Diagnosis not present

## 2018-03-24 DIAGNOSIS — Z23 Encounter for immunization: Secondary | ICD-10-CM | POA: Diagnosis not present

## 2018-03-24 DIAGNOSIS — N2581 Secondary hyperparathyroidism of renal origin: Secondary | ICD-10-CM | POA: Diagnosis not present

## 2018-03-25 DIAGNOSIS — Z87891 Personal history of nicotine dependence: Secondary | ICD-10-CM | POA: Diagnosis not present

## 2018-03-25 DIAGNOSIS — D631 Anemia in chronic kidney disease: Secondary | ICD-10-CM | POA: Diagnosis not present

## 2018-03-25 DIAGNOSIS — N186 End stage renal disease: Secondary | ICD-10-CM | POA: Diagnosis not present

## 2018-03-25 DIAGNOSIS — Z823 Family history of stroke: Secondary | ICD-10-CM | POA: Diagnosis not present

## 2018-03-25 DIAGNOSIS — L03115 Cellulitis of right lower limb: Secondary | ICD-10-CM | POA: Diagnosis not present

## 2018-03-25 DIAGNOSIS — N2581 Secondary hyperparathyroidism of renal origin: Secondary | ICD-10-CM | POA: Diagnosis not present

## 2018-03-25 DIAGNOSIS — L539 Erythematous condition, unspecified: Secondary | ICD-10-CM | POA: Diagnosis not present

## 2018-03-25 DIAGNOSIS — M7989 Other specified soft tissue disorders: Secondary | ICD-10-CM | POA: Diagnosis not present

## 2018-03-25 DIAGNOSIS — N184 Chronic kidney disease, stage 4 (severe): Secondary | ICD-10-CM | POA: Diagnosis not present

## 2018-03-25 DIAGNOSIS — Z992 Dependence on renal dialysis: Secondary | ICD-10-CM | POA: Diagnosis not present

## 2018-03-25 DIAGNOSIS — M79651 Pain in right thigh: Secondary | ICD-10-CM | POA: Diagnosis not present

## 2018-03-25 DIAGNOSIS — D509 Iron deficiency anemia, unspecified: Secondary | ICD-10-CM | POA: Diagnosis not present

## 2018-03-25 DIAGNOSIS — I129 Hypertensive chronic kidney disease with stage 1 through stage 4 chronic kidney disease, or unspecified chronic kidney disease: Secondary | ICD-10-CM | POA: Diagnosis not present

## 2018-03-25 DIAGNOSIS — Z23 Encounter for immunization: Secondary | ICD-10-CM | POA: Diagnosis not present

## 2018-03-25 DIAGNOSIS — Z833 Family history of diabetes mellitus: Secondary | ICD-10-CM | POA: Diagnosis not present

## 2018-03-25 DIAGNOSIS — E1122 Type 2 diabetes mellitus with diabetic chronic kidney disease: Secondary | ICD-10-CM | POA: Diagnosis not present

## 2018-03-25 DIAGNOSIS — L0231 Cutaneous abscess of buttock: Secondary | ICD-10-CM | POA: Diagnosis not present

## 2018-03-26 DIAGNOSIS — N186 End stage renal disease: Secondary | ICD-10-CM | POA: Diagnosis not present

## 2018-03-26 DIAGNOSIS — D631 Anemia in chronic kidney disease: Secondary | ICD-10-CM | POA: Diagnosis not present

## 2018-03-26 DIAGNOSIS — D509 Iron deficiency anemia, unspecified: Secondary | ICD-10-CM | POA: Diagnosis not present

## 2018-03-26 DIAGNOSIS — N2581 Secondary hyperparathyroidism of renal origin: Secondary | ICD-10-CM | POA: Diagnosis not present

## 2018-03-26 DIAGNOSIS — Z992 Dependence on renal dialysis: Secondary | ICD-10-CM | POA: Diagnosis not present

## 2018-03-26 DIAGNOSIS — Z23 Encounter for immunization: Secondary | ICD-10-CM | POA: Diagnosis not present

## 2018-03-27 DIAGNOSIS — Z23 Encounter for immunization: Secondary | ICD-10-CM | POA: Diagnosis not present

## 2018-03-27 DIAGNOSIS — D509 Iron deficiency anemia, unspecified: Secondary | ICD-10-CM | POA: Diagnosis not present

## 2018-03-27 DIAGNOSIS — N2581 Secondary hyperparathyroidism of renal origin: Secondary | ICD-10-CM | POA: Diagnosis not present

## 2018-03-27 DIAGNOSIS — Z992 Dependence on renal dialysis: Secondary | ICD-10-CM | POA: Diagnosis not present

## 2018-03-27 DIAGNOSIS — D631 Anemia in chronic kidney disease: Secondary | ICD-10-CM | POA: Diagnosis not present

## 2018-03-27 DIAGNOSIS — N186 End stage renal disease: Secondary | ICD-10-CM | POA: Diagnosis not present

## 2018-03-28 DIAGNOSIS — Z23 Encounter for immunization: Secondary | ICD-10-CM | POA: Diagnosis not present

## 2018-03-28 DIAGNOSIS — Z992 Dependence on renal dialysis: Secondary | ICD-10-CM | POA: Diagnosis not present

## 2018-03-28 DIAGNOSIS — N2581 Secondary hyperparathyroidism of renal origin: Secondary | ICD-10-CM | POA: Diagnosis not present

## 2018-03-28 DIAGNOSIS — D509 Iron deficiency anemia, unspecified: Secondary | ICD-10-CM | POA: Diagnosis not present

## 2018-03-28 DIAGNOSIS — N186 End stage renal disease: Secondary | ICD-10-CM | POA: Diagnosis not present

## 2018-03-28 DIAGNOSIS — D631 Anemia in chronic kidney disease: Secondary | ICD-10-CM | POA: Diagnosis not present

## 2018-03-29 DIAGNOSIS — N2581 Secondary hyperparathyroidism of renal origin: Secondary | ICD-10-CM | POA: Diagnosis not present

## 2018-03-29 DIAGNOSIS — Z23 Encounter for immunization: Secondary | ICD-10-CM | POA: Diagnosis not present

## 2018-03-29 DIAGNOSIS — N186 End stage renal disease: Secondary | ICD-10-CM | POA: Diagnosis not present

## 2018-03-29 DIAGNOSIS — D509 Iron deficiency anemia, unspecified: Secondary | ICD-10-CM | POA: Diagnosis not present

## 2018-03-29 DIAGNOSIS — Z992 Dependence on renal dialysis: Secondary | ICD-10-CM | POA: Diagnosis not present

## 2018-03-29 DIAGNOSIS — D631 Anemia in chronic kidney disease: Secondary | ICD-10-CM | POA: Diagnosis not present

## 2018-03-30 DIAGNOSIS — L02214 Cutaneous abscess of groin: Secondary | ICD-10-CM | POA: Diagnosis not present

## 2018-03-30 DIAGNOSIS — Z79899 Other long term (current) drug therapy: Secondary | ICD-10-CM | POA: Diagnosis not present

## 2018-03-30 DIAGNOSIS — R42 Dizziness and giddiness: Secondary | ICD-10-CM | POA: Diagnosis not present

## 2018-03-30 DIAGNOSIS — Z23 Encounter for immunization: Secondary | ICD-10-CM | POA: Diagnosis not present

## 2018-03-30 DIAGNOSIS — E860 Dehydration: Secondary | ICD-10-CM | POA: Diagnosis not present

## 2018-03-30 DIAGNOSIS — Z823 Family history of stroke: Secondary | ICD-10-CM | POA: Diagnosis not present

## 2018-03-30 DIAGNOSIS — Z87891 Personal history of nicotine dependence: Secondary | ICD-10-CM | POA: Diagnosis not present

## 2018-03-30 DIAGNOSIS — Z833 Family history of diabetes mellitus: Secondary | ICD-10-CM | POA: Diagnosis not present

## 2018-03-30 DIAGNOSIS — Z992 Dependence on renal dialysis: Secondary | ICD-10-CM | POA: Diagnosis not present

## 2018-03-30 DIAGNOSIS — I129 Hypertensive chronic kidney disease with stage 1 through stage 4 chronic kidney disease, or unspecified chronic kidney disease: Secondary | ICD-10-CM | POA: Diagnosis not present

## 2018-03-30 DIAGNOSIS — N189 Chronic kidney disease, unspecified: Secondary | ICD-10-CM | POA: Diagnosis not present

## 2018-03-30 DIAGNOSIS — N2581 Secondary hyperparathyroidism of renal origin: Secondary | ICD-10-CM | POA: Diagnosis not present

## 2018-03-30 DIAGNOSIS — E1122 Type 2 diabetes mellitus with diabetic chronic kidney disease: Secondary | ICD-10-CM | POA: Diagnosis not present

## 2018-03-30 DIAGNOSIS — D631 Anemia in chronic kidney disease: Secondary | ICD-10-CM | POA: Diagnosis not present

## 2018-03-30 DIAGNOSIS — J90 Pleural effusion, not elsewhere classified: Secondary | ICD-10-CM | POA: Diagnosis not present

## 2018-03-30 DIAGNOSIS — Z8249 Family history of ischemic heart disease and other diseases of the circulatory system: Secondary | ICD-10-CM | POA: Diagnosis not present

## 2018-03-30 DIAGNOSIS — D509 Iron deficiency anemia, unspecified: Secondary | ICD-10-CM | POA: Diagnosis not present

## 2018-03-30 DIAGNOSIS — R112 Nausea with vomiting, unspecified: Secondary | ICD-10-CM | POA: Diagnosis not present

## 2018-03-30 DIAGNOSIS — R05 Cough: Secondary | ICD-10-CM | POA: Diagnosis not present

## 2018-03-30 DIAGNOSIS — N186 End stage renal disease: Secondary | ICD-10-CM | POA: Diagnosis not present

## 2018-03-31 DIAGNOSIS — Z992 Dependence on renal dialysis: Secondary | ICD-10-CM | POA: Diagnosis not present

## 2018-03-31 DIAGNOSIS — N186 End stage renal disease: Secondary | ICD-10-CM | POA: Diagnosis not present

## 2018-03-31 DIAGNOSIS — D631 Anemia in chronic kidney disease: Secondary | ICD-10-CM | POA: Diagnosis not present

## 2018-03-31 DIAGNOSIS — N2581 Secondary hyperparathyroidism of renal origin: Secondary | ICD-10-CM | POA: Diagnosis not present

## 2018-03-31 DIAGNOSIS — D509 Iron deficiency anemia, unspecified: Secondary | ICD-10-CM | POA: Diagnosis not present

## 2018-03-31 DIAGNOSIS — Z23 Encounter for immunization: Secondary | ICD-10-CM | POA: Diagnosis not present

## 2018-04-01 DIAGNOSIS — Z992 Dependence on renal dialysis: Secondary | ICD-10-CM | POA: Diagnosis not present

## 2018-04-01 DIAGNOSIS — N186 End stage renal disease: Secondary | ICD-10-CM | POA: Diagnosis not present

## 2018-04-01 DIAGNOSIS — D631 Anemia in chronic kidney disease: Secondary | ICD-10-CM | POA: Diagnosis not present

## 2018-04-01 DIAGNOSIS — D509 Iron deficiency anemia, unspecified: Secondary | ICD-10-CM | POA: Diagnosis not present

## 2018-04-01 DIAGNOSIS — N2581 Secondary hyperparathyroidism of renal origin: Secondary | ICD-10-CM | POA: Diagnosis not present

## 2018-04-01 DIAGNOSIS — Z23 Encounter for immunization: Secondary | ICD-10-CM | POA: Diagnosis not present

## 2018-04-02 DIAGNOSIS — N186 End stage renal disease: Secondary | ICD-10-CM | POA: Diagnosis not present

## 2018-04-02 DIAGNOSIS — Z992 Dependence on renal dialysis: Secondary | ICD-10-CM | POA: Diagnosis not present

## 2018-04-02 DIAGNOSIS — D631 Anemia in chronic kidney disease: Secondary | ICD-10-CM | POA: Diagnosis not present

## 2018-04-02 DIAGNOSIS — D509 Iron deficiency anemia, unspecified: Secondary | ICD-10-CM | POA: Diagnosis not present

## 2018-04-02 DIAGNOSIS — Z23 Encounter for immunization: Secondary | ICD-10-CM | POA: Diagnosis not present

## 2018-04-02 DIAGNOSIS — N2581 Secondary hyperparathyroidism of renal origin: Secondary | ICD-10-CM | POA: Diagnosis not present

## 2018-04-03 DIAGNOSIS — D631 Anemia in chronic kidney disease: Secondary | ICD-10-CM | POA: Diagnosis not present

## 2018-04-03 DIAGNOSIS — N2581 Secondary hyperparathyroidism of renal origin: Secondary | ICD-10-CM | POA: Diagnosis not present

## 2018-04-03 DIAGNOSIS — D509 Iron deficiency anemia, unspecified: Secondary | ICD-10-CM | POA: Diagnosis not present

## 2018-04-03 DIAGNOSIS — N186 End stage renal disease: Secondary | ICD-10-CM | POA: Diagnosis not present

## 2018-04-03 DIAGNOSIS — Z23 Encounter for immunization: Secondary | ICD-10-CM | POA: Diagnosis not present

## 2018-04-03 DIAGNOSIS — Z992 Dependence on renal dialysis: Secondary | ICD-10-CM | POA: Diagnosis not present

## 2018-04-04 DIAGNOSIS — Z992 Dependence on renal dialysis: Secondary | ICD-10-CM | POA: Diagnosis not present

## 2018-04-04 DIAGNOSIS — N2581 Secondary hyperparathyroidism of renal origin: Secondary | ICD-10-CM | POA: Diagnosis not present

## 2018-04-04 DIAGNOSIS — D631 Anemia in chronic kidney disease: Secondary | ICD-10-CM | POA: Diagnosis not present

## 2018-04-04 DIAGNOSIS — N186 End stage renal disease: Secondary | ICD-10-CM | POA: Diagnosis not present

## 2018-04-04 DIAGNOSIS — Z23 Encounter for immunization: Secondary | ICD-10-CM | POA: Diagnosis not present

## 2018-04-04 DIAGNOSIS — D509 Iron deficiency anemia, unspecified: Secondary | ICD-10-CM | POA: Diagnosis not present

## 2018-04-05 DIAGNOSIS — D631 Anemia in chronic kidney disease: Secondary | ICD-10-CM | POA: Diagnosis not present

## 2018-04-05 DIAGNOSIS — D509 Iron deficiency anemia, unspecified: Secondary | ICD-10-CM | POA: Diagnosis not present

## 2018-04-05 DIAGNOSIS — N186 End stage renal disease: Secondary | ICD-10-CM | POA: Diagnosis not present

## 2018-04-05 DIAGNOSIS — N2581 Secondary hyperparathyroidism of renal origin: Secondary | ICD-10-CM | POA: Diagnosis not present

## 2018-04-05 DIAGNOSIS — Z23 Encounter for immunization: Secondary | ICD-10-CM | POA: Diagnosis not present

## 2018-04-05 DIAGNOSIS — Z992 Dependence on renal dialysis: Secondary | ICD-10-CM | POA: Diagnosis not present

## 2018-04-06 DIAGNOSIS — Z992 Dependence on renal dialysis: Secondary | ICD-10-CM | POA: Diagnosis not present

## 2018-04-06 DIAGNOSIS — N2581 Secondary hyperparathyroidism of renal origin: Secondary | ICD-10-CM | POA: Diagnosis not present

## 2018-04-06 DIAGNOSIS — D631 Anemia in chronic kidney disease: Secondary | ICD-10-CM | POA: Diagnosis not present

## 2018-04-06 DIAGNOSIS — D509 Iron deficiency anemia, unspecified: Secondary | ICD-10-CM | POA: Diagnosis not present

## 2018-04-06 DIAGNOSIS — N186 End stage renal disease: Secondary | ICD-10-CM | POA: Diagnosis not present

## 2018-04-06 DIAGNOSIS — Z23 Encounter for immunization: Secondary | ICD-10-CM | POA: Diagnosis not present

## 2018-04-07 DIAGNOSIS — D631 Anemia in chronic kidney disease: Secondary | ICD-10-CM | POA: Diagnosis not present

## 2018-04-07 DIAGNOSIS — N186 End stage renal disease: Secondary | ICD-10-CM | POA: Diagnosis not present

## 2018-04-07 DIAGNOSIS — Z23 Encounter for immunization: Secondary | ICD-10-CM | POA: Diagnosis not present

## 2018-04-07 DIAGNOSIS — N2581 Secondary hyperparathyroidism of renal origin: Secondary | ICD-10-CM | POA: Diagnosis not present

## 2018-04-07 DIAGNOSIS — Z992 Dependence on renal dialysis: Secondary | ICD-10-CM | POA: Diagnosis not present

## 2018-04-07 DIAGNOSIS — D509 Iron deficiency anemia, unspecified: Secondary | ICD-10-CM | POA: Diagnosis not present

## 2018-04-08 DIAGNOSIS — N186 End stage renal disease: Secondary | ICD-10-CM | POA: Diagnosis not present

## 2018-04-08 DIAGNOSIS — Z992 Dependence on renal dialysis: Secondary | ICD-10-CM | POA: Diagnosis not present

## 2018-04-08 DIAGNOSIS — Z23 Encounter for immunization: Secondary | ICD-10-CM | POA: Diagnosis not present

## 2018-04-08 DIAGNOSIS — D631 Anemia in chronic kidney disease: Secondary | ICD-10-CM | POA: Diagnosis not present

## 2018-04-08 DIAGNOSIS — D509 Iron deficiency anemia, unspecified: Secondary | ICD-10-CM | POA: Diagnosis not present

## 2018-04-08 DIAGNOSIS — N2581 Secondary hyperparathyroidism of renal origin: Secondary | ICD-10-CM | POA: Diagnosis not present

## 2018-04-09 DIAGNOSIS — Z992 Dependence on renal dialysis: Secondary | ICD-10-CM | POA: Diagnosis not present

## 2018-04-09 DIAGNOSIS — Z23 Encounter for immunization: Secondary | ICD-10-CM | POA: Diagnosis not present

## 2018-04-09 DIAGNOSIS — N186 End stage renal disease: Secondary | ICD-10-CM | POA: Diagnosis not present

## 2018-04-09 DIAGNOSIS — D509 Iron deficiency anemia, unspecified: Secondary | ICD-10-CM | POA: Diagnosis not present

## 2018-04-09 DIAGNOSIS — D631 Anemia in chronic kidney disease: Secondary | ICD-10-CM | POA: Diagnosis not present

## 2018-04-09 DIAGNOSIS — N2581 Secondary hyperparathyroidism of renal origin: Secondary | ICD-10-CM | POA: Diagnosis not present

## 2018-04-10 DIAGNOSIS — N186 End stage renal disease: Secondary | ICD-10-CM | POA: Diagnosis not present

## 2018-04-10 DIAGNOSIS — Z23 Encounter for immunization: Secondary | ICD-10-CM | POA: Diagnosis not present

## 2018-04-10 DIAGNOSIS — D631 Anemia in chronic kidney disease: Secondary | ICD-10-CM | POA: Diagnosis not present

## 2018-04-10 DIAGNOSIS — D509 Iron deficiency anemia, unspecified: Secondary | ICD-10-CM | POA: Diagnosis not present

## 2018-04-10 DIAGNOSIS — Z992 Dependence on renal dialysis: Secondary | ICD-10-CM | POA: Diagnosis not present

## 2018-04-10 DIAGNOSIS — N2581 Secondary hyperparathyroidism of renal origin: Secondary | ICD-10-CM | POA: Diagnosis not present

## 2018-04-11 DIAGNOSIS — N186 End stage renal disease: Secondary | ICD-10-CM | POA: Diagnosis not present

## 2018-04-11 DIAGNOSIS — N2581 Secondary hyperparathyroidism of renal origin: Secondary | ICD-10-CM | POA: Diagnosis not present

## 2018-04-11 DIAGNOSIS — Z992 Dependence on renal dialysis: Secondary | ICD-10-CM | POA: Diagnosis not present

## 2018-04-11 DIAGNOSIS — D631 Anemia in chronic kidney disease: Secondary | ICD-10-CM | POA: Diagnosis not present

## 2018-04-11 DIAGNOSIS — D509 Iron deficiency anemia, unspecified: Secondary | ICD-10-CM | POA: Diagnosis not present

## 2018-04-11 DIAGNOSIS — Z23 Encounter for immunization: Secondary | ICD-10-CM | POA: Diagnosis not present

## 2018-04-12 DIAGNOSIS — N2581 Secondary hyperparathyroidism of renal origin: Secondary | ICD-10-CM | POA: Diagnosis not present

## 2018-04-12 DIAGNOSIS — Z23 Encounter for immunization: Secondary | ICD-10-CM | POA: Diagnosis not present

## 2018-04-12 DIAGNOSIS — Z992 Dependence on renal dialysis: Secondary | ICD-10-CM | POA: Diagnosis not present

## 2018-04-12 DIAGNOSIS — D631 Anemia in chronic kidney disease: Secondary | ICD-10-CM | POA: Diagnosis not present

## 2018-04-12 DIAGNOSIS — D509 Iron deficiency anemia, unspecified: Secondary | ICD-10-CM | POA: Diagnosis not present

## 2018-04-12 DIAGNOSIS — N186 End stage renal disease: Secondary | ICD-10-CM | POA: Diagnosis not present

## 2018-04-13 DIAGNOSIS — D509 Iron deficiency anemia, unspecified: Secondary | ICD-10-CM | POA: Diagnosis not present

## 2018-04-13 DIAGNOSIS — N186 End stage renal disease: Secondary | ICD-10-CM | POA: Diagnosis not present

## 2018-04-13 DIAGNOSIS — N2581 Secondary hyperparathyroidism of renal origin: Secondary | ICD-10-CM | POA: Diagnosis not present

## 2018-04-13 DIAGNOSIS — D631 Anemia in chronic kidney disease: Secondary | ICD-10-CM | POA: Diagnosis not present

## 2018-04-13 DIAGNOSIS — Z992 Dependence on renal dialysis: Secondary | ICD-10-CM | POA: Diagnosis not present

## 2018-04-13 DIAGNOSIS — Z23 Encounter for immunization: Secondary | ICD-10-CM | POA: Diagnosis not present

## 2018-04-14 DIAGNOSIS — N186 End stage renal disease: Secondary | ICD-10-CM | POA: Diagnosis not present

## 2018-04-14 DIAGNOSIS — Z992 Dependence on renal dialysis: Secondary | ICD-10-CM | POA: Diagnosis not present

## 2018-04-14 DIAGNOSIS — K659 Peritonitis, unspecified: Secondary | ICD-10-CM | POA: Diagnosis not present

## 2018-04-14 DIAGNOSIS — D509 Iron deficiency anemia, unspecified: Secondary | ICD-10-CM | POA: Diagnosis not present

## 2018-04-14 DIAGNOSIS — D631 Anemia in chronic kidney disease: Secondary | ICD-10-CM | POA: Diagnosis not present

## 2018-04-14 DIAGNOSIS — B965 Pseudomonas (aeruginosa) (mallei) (pseudomallei) as the cause of diseases classified elsewhere: Secondary | ICD-10-CM | POA: Diagnosis not present

## 2018-04-15 DIAGNOSIS — D631 Anemia in chronic kidney disease: Secondary | ICD-10-CM | POA: Diagnosis not present

## 2018-04-15 DIAGNOSIS — D509 Iron deficiency anemia, unspecified: Secondary | ICD-10-CM | POA: Diagnosis not present

## 2018-04-15 DIAGNOSIS — B965 Pseudomonas (aeruginosa) (mallei) (pseudomallei) as the cause of diseases classified elsewhere: Secondary | ICD-10-CM | POA: Diagnosis not present

## 2018-04-15 DIAGNOSIS — N186 End stage renal disease: Secondary | ICD-10-CM | POA: Diagnosis not present

## 2018-04-15 DIAGNOSIS — K659 Peritonitis, unspecified: Secondary | ICD-10-CM | POA: Diagnosis not present

## 2018-04-15 DIAGNOSIS — Z992 Dependence on renal dialysis: Secondary | ICD-10-CM | POA: Diagnosis not present

## 2018-04-16 DIAGNOSIS — D509 Iron deficiency anemia, unspecified: Secondary | ICD-10-CM | POA: Diagnosis not present

## 2018-04-16 DIAGNOSIS — Z992 Dependence on renal dialysis: Secondary | ICD-10-CM | POA: Diagnosis not present

## 2018-04-16 DIAGNOSIS — D631 Anemia in chronic kidney disease: Secondary | ICD-10-CM | POA: Diagnosis not present

## 2018-04-16 DIAGNOSIS — N186 End stage renal disease: Secondary | ICD-10-CM | POA: Diagnosis not present

## 2018-04-16 DIAGNOSIS — K659 Peritonitis, unspecified: Secondary | ICD-10-CM | POA: Diagnosis not present

## 2018-04-16 DIAGNOSIS — B965 Pseudomonas (aeruginosa) (mallei) (pseudomallei) as the cause of diseases classified elsewhere: Secondary | ICD-10-CM | POA: Diagnosis not present

## 2018-04-17 DIAGNOSIS — D631 Anemia in chronic kidney disease: Secondary | ICD-10-CM | POA: Diagnosis not present

## 2018-04-17 DIAGNOSIS — D509 Iron deficiency anemia, unspecified: Secondary | ICD-10-CM | POA: Diagnosis not present

## 2018-04-17 DIAGNOSIS — Z992 Dependence on renal dialysis: Secondary | ICD-10-CM | POA: Diagnosis not present

## 2018-04-17 DIAGNOSIS — B965 Pseudomonas (aeruginosa) (mallei) (pseudomallei) as the cause of diseases classified elsewhere: Secondary | ICD-10-CM | POA: Diagnosis not present

## 2018-04-17 DIAGNOSIS — N186 End stage renal disease: Secondary | ICD-10-CM | POA: Diagnosis not present

## 2018-04-17 DIAGNOSIS — K659 Peritonitis, unspecified: Secondary | ICD-10-CM | POA: Diagnosis not present

## 2018-04-18 DIAGNOSIS — Z992 Dependence on renal dialysis: Secondary | ICD-10-CM | POA: Diagnosis not present

## 2018-04-18 DIAGNOSIS — D509 Iron deficiency anemia, unspecified: Secondary | ICD-10-CM | POA: Diagnosis not present

## 2018-04-18 DIAGNOSIS — K659 Peritonitis, unspecified: Secondary | ICD-10-CM | POA: Diagnosis not present

## 2018-04-18 DIAGNOSIS — N186 End stage renal disease: Secondary | ICD-10-CM | POA: Diagnosis not present

## 2018-04-18 DIAGNOSIS — D631 Anemia in chronic kidney disease: Secondary | ICD-10-CM | POA: Diagnosis not present

## 2018-04-18 DIAGNOSIS — B965 Pseudomonas (aeruginosa) (mallei) (pseudomallei) as the cause of diseases classified elsewhere: Secondary | ICD-10-CM | POA: Diagnosis not present

## 2018-04-19 DIAGNOSIS — D509 Iron deficiency anemia, unspecified: Secondary | ICD-10-CM | POA: Diagnosis not present

## 2018-04-19 DIAGNOSIS — K659 Peritonitis, unspecified: Secondary | ICD-10-CM | POA: Diagnosis not present

## 2018-04-19 DIAGNOSIS — Z992 Dependence on renal dialysis: Secondary | ICD-10-CM | POA: Diagnosis not present

## 2018-04-19 DIAGNOSIS — N186 End stage renal disease: Secondary | ICD-10-CM | POA: Diagnosis not present

## 2018-04-19 DIAGNOSIS — B965 Pseudomonas (aeruginosa) (mallei) (pseudomallei) as the cause of diseases classified elsewhere: Secondary | ICD-10-CM | POA: Diagnosis not present

## 2018-04-19 DIAGNOSIS — D631 Anemia in chronic kidney disease: Secondary | ICD-10-CM | POA: Diagnosis not present

## 2018-04-20 DIAGNOSIS — Z992 Dependence on renal dialysis: Secondary | ICD-10-CM | POA: Diagnosis not present

## 2018-04-20 DIAGNOSIS — K659 Peritonitis, unspecified: Secondary | ICD-10-CM | POA: Diagnosis not present

## 2018-04-20 DIAGNOSIS — D631 Anemia in chronic kidney disease: Secondary | ICD-10-CM | POA: Diagnosis not present

## 2018-04-20 DIAGNOSIS — N186 End stage renal disease: Secondary | ICD-10-CM | POA: Diagnosis not present

## 2018-04-20 DIAGNOSIS — B965 Pseudomonas (aeruginosa) (mallei) (pseudomallei) as the cause of diseases classified elsewhere: Secondary | ICD-10-CM | POA: Diagnosis not present

## 2018-04-20 DIAGNOSIS — D509 Iron deficiency anemia, unspecified: Secondary | ICD-10-CM | POA: Diagnosis not present

## 2018-04-21 DIAGNOSIS — K659 Peritonitis, unspecified: Secondary | ICD-10-CM | POA: Diagnosis not present

## 2018-04-21 DIAGNOSIS — B965 Pseudomonas (aeruginosa) (mallei) (pseudomallei) as the cause of diseases classified elsewhere: Secondary | ICD-10-CM | POA: Diagnosis not present

## 2018-04-21 DIAGNOSIS — N186 End stage renal disease: Secondary | ICD-10-CM | POA: Diagnosis not present

## 2018-04-21 DIAGNOSIS — Z992 Dependence on renal dialysis: Secondary | ICD-10-CM | POA: Diagnosis not present

## 2018-04-21 DIAGNOSIS — D631 Anemia in chronic kidney disease: Secondary | ICD-10-CM | POA: Diagnosis not present

## 2018-04-21 DIAGNOSIS — D509 Iron deficiency anemia, unspecified: Secondary | ICD-10-CM | POA: Diagnosis not present

## 2018-04-22 DIAGNOSIS — D631 Anemia in chronic kidney disease: Secondary | ICD-10-CM | POA: Diagnosis not present

## 2018-04-22 DIAGNOSIS — Z992 Dependence on renal dialysis: Secondary | ICD-10-CM | POA: Diagnosis not present

## 2018-04-22 DIAGNOSIS — K659 Peritonitis, unspecified: Secondary | ICD-10-CM | POA: Diagnosis not present

## 2018-04-22 DIAGNOSIS — N186 End stage renal disease: Secondary | ICD-10-CM | POA: Diagnosis not present

## 2018-04-22 DIAGNOSIS — D509 Iron deficiency anemia, unspecified: Secondary | ICD-10-CM | POA: Diagnosis not present

## 2018-04-22 DIAGNOSIS — B965 Pseudomonas (aeruginosa) (mallei) (pseudomallei) as the cause of diseases classified elsewhere: Secondary | ICD-10-CM | POA: Diagnosis not present

## 2018-04-23 DIAGNOSIS — D509 Iron deficiency anemia, unspecified: Secondary | ICD-10-CM | POA: Diagnosis not present

## 2018-04-23 DIAGNOSIS — N186 End stage renal disease: Secondary | ICD-10-CM | POA: Diagnosis not present

## 2018-04-23 DIAGNOSIS — Z992 Dependence on renal dialysis: Secondary | ICD-10-CM | POA: Diagnosis not present

## 2018-04-23 DIAGNOSIS — B965 Pseudomonas (aeruginosa) (mallei) (pseudomallei) as the cause of diseases classified elsewhere: Secondary | ICD-10-CM | POA: Diagnosis not present

## 2018-04-23 DIAGNOSIS — D631 Anemia in chronic kidney disease: Secondary | ICD-10-CM | POA: Diagnosis not present

## 2018-04-23 DIAGNOSIS — K659 Peritonitis, unspecified: Secondary | ICD-10-CM | POA: Diagnosis not present

## 2018-04-24 DIAGNOSIS — D631 Anemia in chronic kidney disease: Secondary | ICD-10-CM | POA: Diagnosis not present

## 2018-04-24 DIAGNOSIS — N186 End stage renal disease: Secondary | ICD-10-CM | POA: Diagnosis not present

## 2018-04-24 DIAGNOSIS — D509 Iron deficiency anemia, unspecified: Secondary | ICD-10-CM | POA: Diagnosis not present

## 2018-04-24 DIAGNOSIS — Z992 Dependence on renal dialysis: Secondary | ICD-10-CM | POA: Diagnosis not present

## 2018-04-24 DIAGNOSIS — K659 Peritonitis, unspecified: Secondary | ICD-10-CM | POA: Diagnosis not present

## 2018-04-24 DIAGNOSIS — B965 Pseudomonas (aeruginosa) (mallei) (pseudomallei) as the cause of diseases classified elsewhere: Secondary | ICD-10-CM | POA: Diagnosis not present

## 2018-04-25 DIAGNOSIS — Z992 Dependence on renal dialysis: Secondary | ICD-10-CM | POA: Diagnosis not present

## 2018-04-25 DIAGNOSIS — R109 Unspecified abdominal pain: Secondary | ICD-10-CM | POA: Diagnosis not present

## 2018-04-25 DIAGNOSIS — B965 Pseudomonas (aeruginosa) (mallei) (pseudomallei) as the cause of diseases classified elsewhere: Secondary | ICD-10-CM | POA: Diagnosis not present

## 2018-04-25 DIAGNOSIS — K659 Peritonitis, unspecified: Secondary | ICD-10-CM | POA: Diagnosis not present

## 2018-04-25 DIAGNOSIS — D509 Iron deficiency anemia, unspecified: Secondary | ICD-10-CM | POA: Diagnosis not present

## 2018-04-25 DIAGNOSIS — D631 Anemia in chronic kidney disease: Secondary | ICD-10-CM | POA: Diagnosis not present

## 2018-04-25 DIAGNOSIS — N186 End stage renal disease: Secondary | ICD-10-CM | POA: Diagnosis not present

## 2018-04-26 DIAGNOSIS — R918 Other nonspecific abnormal finding of lung field: Secondary | ICD-10-CM | POA: Diagnosis not present

## 2018-04-26 DIAGNOSIS — R109 Unspecified abdominal pain: Secondary | ICD-10-CM | POA: Diagnosis not present

## 2018-04-26 DIAGNOSIS — Z87891 Personal history of nicotine dependence: Secondary | ICD-10-CM | POA: Diagnosis not present

## 2018-04-26 DIAGNOSIS — E785 Hyperlipidemia, unspecified: Secondary | ICD-10-CM | POA: Diagnosis present

## 2018-04-26 DIAGNOSIS — L03115 Cellulitis of right lower limb: Secondary | ICD-10-CM | POA: Diagnosis present

## 2018-04-26 DIAGNOSIS — T8571XA Infection and inflammatory reaction due to peritoneal dialysis catheter, initial encounter: Secondary | ICD-10-CM | POA: Diagnosis not present

## 2018-04-26 DIAGNOSIS — L0291 Cutaneous abscess, unspecified: Secondary | ICD-10-CM | POA: Diagnosis not present

## 2018-04-26 DIAGNOSIS — E1122 Type 2 diabetes mellitus with diabetic chronic kidney disease: Secondary | ICD-10-CM | POA: Diagnosis present

## 2018-04-26 DIAGNOSIS — D631 Anemia in chronic kidney disease: Secondary | ICD-10-CM | POA: Diagnosis not present

## 2018-04-26 DIAGNOSIS — B965 Pseudomonas (aeruginosa) (mallei) (pseudomallei) as the cause of diseases classified elsewhere: Secondary | ICD-10-CM | POA: Diagnosis present

## 2018-04-26 DIAGNOSIS — I120 Hypertensive chronic kidney disease with stage 5 chronic kidney disease or end stage renal disease: Secondary | ICD-10-CM | POA: Diagnosis present

## 2018-04-26 DIAGNOSIS — D539 Nutritional anemia, unspecified: Secondary | ICD-10-CM | POA: Diagnosis present

## 2018-04-26 DIAGNOSIS — N186 End stage renal disease: Secondary | ICD-10-CM | POA: Diagnosis present

## 2018-04-26 DIAGNOSIS — Z6838 Body mass index (BMI) 38.0-38.9, adult: Secondary | ICD-10-CM | POA: Diagnosis not present

## 2018-04-26 DIAGNOSIS — B9562 Methicillin resistant Staphylococcus aureus infection as the cause of diseases classified elsewhere: Secondary | ICD-10-CM | POA: Diagnosis present

## 2018-04-26 DIAGNOSIS — L02415 Cutaneous abscess of right lower limb: Secondary | ICD-10-CM | POA: Diagnosis not present

## 2018-04-26 DIAGNOSIS — Z992 Dependence on renal dialysis: Secondary | ICD-10-CM | POA: Diagnosis not present

## 2018-04-26 DIAGNOSIS — J90 Pleural effusion, not elsewhere classified: Secondary | ICD-10-CM | POA: Diagnosis not present

## 2018-04-26 DIAGNOSIS — D509 Iron deficiency anemia, unspecified: Secondary | ICD-10-CM | POA: Diagnosis not present

## 2018-04-26 DIAGNOSIS — E871 Hypo-osmolality and hyponatremia: Secondary | ICD-10-CM | POA: Diagnosis present

## 2018-04-26 DIAGNOSIS — K659 Peritonitis, unspecified: Secondary | ICD-10-CM | POA: Diagnosis present

## 2018-04-26 DIAGNOSIS — Z7984 Long term (current) use of oral hypoglycemic drugs: Secondary | ICD-10-CM | POA: Diagnosis not present

## 2018-04-26 DIAGNOSIS — R1013 Epigastric pain: Secondary | ICD-10-CM | POA: Diagnosis not present

## 2018-04-28 DIAGNOSIS — D631 Anemia in chronic kidney disease: Secondary | ICD-10-CM | POA: Diagnosis not present

## 2018-04-28 DIAGNOSIS — K659 Peritonitis, unspecified: Secondary | ICD-10-CM | POA: Diagnosis not present

## 2018-04-28 DIAGNOSIS — D509 Iron deficiency anemia, unspecified: Secondary | ICD-10-CM | POA: Diagnosis not present

## 2018-04-28 DIAGNOSIS — Z992 Dependence on renal dialysis: Secondary | ICD-10-CM | POA: Diagnosis not present

## 2018-04-28 DIAGNOSIS — N186 End stage renal disease: Secondary | ICD-10-CM | POA: Diagnosis not present

## 2018-04-28 DIAGNOSIS — B965 Pseudomonas (aeruginosa) (mallei) (pseudomallei) as the cause of diseases classified elsewhere: Secondary | ICD-10-CM | POA: Diagnosis not present

## 2018-04-28 MED ORDER — ACETAMINOPHEN 500 MG PO TABS
1000.00 | ORAL_TABLET | ORAL | Status: DC
Start: ? — End: 2018-04-28

## 2018-04-28 MED ORDER — GENERIC EXTERNAL MEDICATION
2.00 | Status: DC
Start: ? — End: 2018-04-28

## 2018-04-28 MED ORDER — GENERIC EXTERNAL MEDICATION
Status: DC
Start: ? — End: 2018-04-28

## 2018-04-28 MED ORDER — ATORVASTATIN CALCIUM 80 MG PO TABS
80.00 | ORAL_TABLET | ORAL | Status: DC
Start: 2018-04-28 — End: 2018-04-28

## 2018-04-28 MED ORDER — BUMETANIDE 1 MG PO TABS
2.00 | ORAL_TABLET | ORAL | Status: DC
Start: 2018-04-29 — End: 2018-04-28

## 2018-04-28 MED ORDER — LEVOFLOXACIN 500 MG PO TABS
500.00 | ORAL_TABLET | ORAL | Status: DC
Start: ? — End: 2018-04-28

## 2018-04-28 MED ORDER — GENERIC EXTERNAL MEDICATION
5.00 | Status: DC
Start: ? — End: 2018-04-28

## 2018-04-28 MED ORDER — GUAIFENESIN 100 MG/5ML PO SYRP
200.00 | ORAL_SOLUTION | ORAL | Status: DC
Start: ? — End: 2018-04-28

## 2018-04-28 MED ORDER — HEPARIN SODIUM (PORCINE) 5000 UNIT/ML IJ SOLN
5000.00 | INTRAMUSCULAR | Status: DC
Start: 2018-04-28 — End: 2018-04-28

## 2018-04-28 MED ORDER — METRONIDAZOLE 500 MG PO TABS
500.00 | ORAL_TABLET | ORAL | Status: DC
Start: 2018-04-28 — End: 2018-04-28

## 2018-04-28 MED ORDER — ONDANSETRON 4 MG PO TBDP
4.00 | ORAL_TABLET | ORAL | Status: DC
Start: ? — End: 2018-04-28

## 2018-04-28 MED ORDER — INSULIN LISPRO 100 UNIT/ML ~~LOC~~ SOLN
1.00 | SUBCUTANEOUS | Status: DC
Start: 2018-04-28 — End: 2018-04-28

## 2018-04-28 MED ORDER — POLYETHYLENE GLYCOL 3350 17 G PO PACK
17.00 | PACK | ORAL | Status: DC
Start: ? — End: 2018-04-28

## 2018-04-28 MED ORDER — MELATONIN 3 MG PO TABS
3.00 | ORAL_TABLET | ORAL | Status: DC
Start: ? — End: 2018-04-28

## 2018-04-28 MED ORDER — CALCIUM ACETATE (PHOS BINDER) 667 MG PO CAPS
2668.00 | ORAL_CAPSULE | ORAL | Status: DC
Start: 2018-04-28 — End: 2018-04-28

## 2018-04-28 MED ORDER — VITAMIN D (ERGOCALCIFEROL) 1.25 MG (50000 UNIT) PO CAPS
50000.00 | ORAL_CAPSULE | ORAL | Status: DC
Start: 2018-05-03 — End: 2018-04-28

## 2018-04-29 DIAGNOSIS — D509 Iron deficiency anemia, unspecified: Secondary | ICD-10-CM | POA: Diagnosis not present

## 2018-04-29 DIAGNOSIS — Z992 Dependence on renal dialysis: Secondary | ICD-10-CM | POA: Diagnosis not present

## 2018-04-29 DIAGNOSIS — N186 End stage renal disease: Secondary | ICD-10-CM | POA: Diagnosis not present

## 2018-04-29 DIAGNOSIS — B965 Pseudomonas (aeruginosa) (mallei) (pseudomallei) as the cause of diseases classified elsewhere: Secondary | ICD-10-CM | POA: Diagnosis not present

## 2018-04-29 DIAGNOSIS — D631 Anemia in chronic kidney disease: Secondary | ICD-10-CM | POA: Diagnosis not present

## 2018-04-29 DIAGNOSIS — K659 Peritonitis, unspecified: Secondary | ICD-10-CM | POA: Diagnosis not present

## 2018-04-30 DIAGNOSIS — Z992 Dependence on renal dialysis: Secondary | ICD-10-CM | POA: Diagnosis not present

## 2018-04-30 DIAGNOSIS — N186 End stage renal disease: Secondary | ICD-10-CM | POA: Diagnosis not present

## 2018-04-30 DIAGNOSIS — B965 Pseudomonas (aeruginosa) (mallei) (pseudomallei) as the cause of diseases classified elsewhere: Secondary | ICD-10-CM | POA: Diagnosis not present

## 2018-04-30 DIAGNOSIS — D631 Anemia in chronic kidney disease: Secondary | ICD-10-CM | POA: Diagnosis not present

## 2018-04-30 DIAGNOSIS — K659 Peritonitis, unspecified: Secondary | ICD-10-CM | POA: Diagnosis not present

## 2018-04-30 DIAGNOSIS — D509 Iron deficiency anemia, unspecified: Secondary | ICD-10-CM | POA: Diagnosis not present

## 2018-05-01 ENCOUNTER — Inpatient Hospital Stay
Admission: EM | Admit: 2018-05-01 | Discharge: 2018-05-06 | DRG: 371 | Disposition: A | Discharge status: Home Health | Payer: Medicare Other | Attending: Internal Medicine | Admitting: Internal Medicine

## 2018-05-01 DIAGNOSIS — R109 Unspecified abdominal pain: Secondary | ICD-10-CM | POA: Diagnosis not present

## 2018-05-01 DIAGNOSIS — K652 Spontaneous bacterial peritonitis: Secondary | ICD-10-CM | POA: Diagnosis not present

## 2018-05-01 DIAGNOSIS — K659 Peritonitis, unspecified: Secondary | ICD-10-CM

## 2018-05-01 DIAGNOSIS — L03314 Cellulitis of groin: Secondary | ICD-10-CM | POA: Diagnosis present

## 2018-05-01 DIAGNOSIS — I120 Hypertensive chronic kidney disease with stage 5 chronic kidney disease or end stage renal disease: Secondary | ICD-10-CM | POA: Diagnosis present

## 2018-05-01 DIAGNOSIS — Z794 Long term (current) use of insulin: Secondary | ICD-10-CM

## 2018-05-01 DIAGNOSIS — T8571XA Infection and inflammatory reaction due to peritoneal dialysis catheter, initial encounter: Secondary | ICD-10-CM

## 2018-05-01 DIAGNOSIS — R1084 Generalized abdominal pain: Secondary | ICD-10-CM | POA: Diagnosis not present

## 2018-05-01 DIAGNOSIS — E11319 Type 2 diabetes mellitus with unspecified diabetic retinopathy without macular edema: Secondary | ICD-10-CM | POA: Diagnosis present

## 2018-05-01 DIAGNOSIS — B965 Pseudomonas (aeruginosa) (mallei) (pseudomallei) as the cause of diseases classified elsewhere: Secondary | ICD-10-CM | POA: Diagnosis not present

## 2018-05-01 DIAGNOSIS — Z87891 Personal history of nicotine dependence: Secondary | ICD-10-CM

## 2018-05-01 DIAGNOSIS — H6982 Other specified disorders of Eustachian tube, left ear: Secondary | ICD-10-CM

## 2018-05-01 DIAGNOSIS — E1122 Type 2 diabetes mellitus with diabetic chronic kidney disease: Secondary | ICD-10-CM | POA: Diagnosis not present

## 2018-05-01 DIAGNOSIS — N186 End stage renal disease: Secondary | ICD-10-CM | POA: Diagnosis not present

## 2018-05-01 DIAGNOSIS — K5900 Constipation, unspecified: Secondary | ICD-10-CM | POA: Diagnosis present

## 2018-05-01 DIAGNOSIS — E785 Hyperlipidemia, unspecified: Secondary | ICD-10-CM | POA: Diagnosis present

## 2018-05-01 DIAGNOSIS — N2581 Secondary hyperparathyroidism of renal origin: Secondary | ICD-10-CM | POA: Diagnosis present

## 2018-05-01 DIAGNOSIS — D631 Anemia in chronic kidney disease: Secondary | ICD-10-CM | POA: Diagnosis present

## 2018-05-01 DIAGNOSIS — E1140 Type 2 diabetes mellitus with diabetic neuropathy, unspecified: Secondary | ICD-10-CM | POA: Diagnosis present

## 2018-05-01 DIAGNOSIS — Z833 Family history of diabetes mellitus: Secondary | ICD-10-CM

## 2018-05-01 DIAGNOSIS — Z79899 Other long term (current) drug therapy: Secondary | ICD-10-CM

## 2018-05-01 DIAGNOSIS — Z992 Dependence on renal dialysis: Secondary | ICD-10-CM | POA: Diagnosis not present

## 2018-05-01 DIAGNOSIS — R0902 Hypoxemia: Secondary | ICD-10-CM | POA: Diagnosis present

## 2018-05-01 DIAGNOSIS — Z7951 Long term (current) use of inhaled steroids: Secondary | ICD-10-CM

## 2018-05-01 DIAGNOSIS — D509 Iron deficiency anemia, unspecified: Secondary | ICD-10-CM | POA: Diagnosis not present

## 2018-05-01 NOTE — ED Triage Notes (Signed)
Per pt he has peritonitis. He has a PD cath and has been doing this for 3 years. Last Tuesday he was admitted to [facility] and was treated for peritonitis. He was discharged on Friday. This am he felt much more pain and he called EMS. He states they told him to come to [facility] since his kidney doctors are here in [place]. Pain 10/10. Pt is A&O x4 and a good historian

## 2018-05-02 ENCOUNTER — Other Ambulatory Visit: Payer: Self-pay

## 2018-05-02 ENCOUNTER — Encounter: Payer: Self-pay | Admitting: *Deleted

## 2018-05-02 DIAGNOSIS — I10 Essential (primary) hypertension: Secondary | ICD-10-CM | POA: Diagnosis not present

## 2018-05-02 DIAGNOSIS — D631 Anemia in chronic kidney disease: Secondary | ICD-10-CM | POA: Diagnosis not present

## 2018-05-02 DIAGNOSIS — R109 Unspecified abdominal pain: Secondary | ICD-10-CM | POA: Diagnosis not present

## 2018-05-02 DIAGNOSIS — E119 Type 2 diabetes mellitus without complications: Secondary | ICD-10-CM | POA: Diagnosis not present

## 2018-05-02 DIAGNOSIS — I120 Hypertensive chronic kidney disease with stage 5 chronic kidney disease or end stage renal disease: Secondary | ICD-10-CM | POA: Diagnosis not present

## 2018-05-02 DIAGNOSIS — K659 Peritonitis, unspecified: Secondary | ICD-10-CM

## 2018-05-02 DIAGNOSIS — N186 End stage renal disease: Secondary | ICD-10-CM | POA: Diagnosis not present

## 2018-05-02 DIAGNOSIS — N2581 Secondary hyperparathyroidism of renal origin: Secondary | ICD-10-CM | POA: Diagnosis not present

## 2018-05-02 LAB — BODY FLUID CELL COUNT WITH DIFFERENTIAL
Eos, Fluid: 5 %
LYMPHS FL: 7 %
MONOCYTE-MACROPHAGE-SEROUS FLUID: 21 %
Neutrophil Count, Fluid: 66 %
Other Cells, Fluid: 1 %
Total Nucleated Cell Count, Fluid: 2415 cu mm

## 2018-05-02 LAB — COMPREHENSIVE METABOLIC PANEL
ALBUMIN: 2.6 g/dL — AB (ref 3.5–5.0)
ALT: 40 U/L (ref 0–44)
AST: 43 U/L — AB (ref 15–41)
Alkaline Phosphatase: 281 U/L — ABNORMAL HIGH (ref 38–126)
Anion gap: 16 — ABNORMAL HIGH (ref 5–15)
BUN: 57 mg/dL — AB (ref 6–20)
CHLORIDE: 86 mmol/L — AB (ref 98–111)
CO2: 28 mmol/L (ref 22–32)
Calcium: 9.3 mg/dL (ref 8.9–10.3)
Creatinine, Ser: 10.75 mg/dL — ABNORMAL HIGH (ref 0.61–1.24)
GFR calc Af Amer: 6 mL/min — ABNORMAL LOW (ref >60)
GFR calc non Af Amer: 5 mL/min — ABNORMAL LOW (ref >60)
GLUCOSE: 163 mg/dL — AB (ref 70–99)
POTASSIUM: 3.9 mmol/L (ref 3.5–5.1)
Sodium: 130 mmol/L — ABNORMAL LOW (ref 135–145)
Total Bilirubin: 0.9 mg/dL (ref 0.3–1.2)
Total Protein: 7.2 g/dL (ref 6.5–8.1)

## 2018-05-02 LAB — CBC WITH DIFFERENTIAL/PLATELET
Abs Immature Granulocytes: 0.22 10*3/uL — ABNORMAL HIGH (ref 0.00–0.07)
BASOS ABS: 0 10*3/uL (ref 0.0–0.1)
BASOS PCT: 0 %
EOS ABS: 0.1 10*3/uL (ref 0.0–0.5)
Eosinophils Relative: 1 %
HEMATOCRIT: 25.8 % — AB (ref 39.0–52.0)
Hemoglobin: 8.2 g/dL — ABNORMAL LOW (ref 13.0–17.0)
IMMATURE GRANULOCYTES: 2 %
LYMPHS ABS: 0.7 10*3/uL (ref 0.7–4.0)
Lymphocytes Relative: 6 %
MCH: 32.7 pg (ref 26.0–34.0)
MCHC: 31.8 g/dL (ref 30.0–36.0)
MCV: 102.8 fL — ABNORMAL HIGH (ref 80.0–100.0)
Monocytes Absolute: 1.1 10*3/uL — ABNORMAL HIGH (ref 0.1–1.0)
Monocytes Relative: 9 %
NEUTROS PCT: 82 %
NRBC: 0 % (ref 0.0–0.2)
Neutro Abs: 10.1 10*3/uL — ABNORMAL HIGH (ref 1.7–7.7)
PLATELETS: 229 10*3/uL (ref 150–400)
RBC: 2.51 MIL/uL — ABNORMAL LOW (ref 4.22–5.81)
RDW: 17 % — AB (ref 11.5–15.5)
WBC: 12.2 10*3/uL — ABNORMAL HIGH (ref 4.0–10.5)

## 2018-05-02 LAB — GLUCOSE, CAPILLARY
Glucose-Capillary: 122 mg/dL — ABNORMAL HIGH (ref 70–99)
Glucose-Capillary: 77 mg/dL (ref 70–99)
Glucose-Capillary: 107 mg/dL — ABNORMAL HIGH (ref 70–99)
Glucose-Capillary: 157 mg/dL — ABNORMAL HIGH (ref 70–99)

## 2018-05-02 LAB — LIPASE, BLOOD: Lipase: 28 U/L (ref 11–51)

## 2018-05-02 LAB — TSH: TSH: 2.788 u[IU]/mL (ref 0.350–4.500)

## 2018-05-02 LAB — MRSA PCR SCREENING: MRSA by PCR: NEGATIVE

## 2018-05-02 MED ORDER — AMLODIPINE BESYLATE 10 MG PO TABS
10.0000 mg | ORAL_TABLET | Freq: Two times a day (BID) | ORAL | Status: DC
  Administered 2018-05-02 – 2018-05-06 (×8): 10 mg via ORAL
  Filled 2018-05-02 (×9): qty 1

## 2018-05-02 MED ORDER — MORPHINE SULFATE (PF) 2 MG/ML IV SOLN
2.0000 mg | INTRAVENOUS | Status: DC | PRN
  Administered 2018-05-02 – 2018-05-03 (×3): 2 mg via INTRAVENOUS
  Filled 2018-05-02 (×4): qty 1

## 2018-05-02 MED ORDER — CEFTAZIDIME 200 MG/ML FOR DIALYSIS: 1500.0000 mg | INJECTION | Freq: Once | INTRAVENOUS_CENTRAL | Status: DC

## 2018-05-02 MED ORDER — HEPARIN SODIUM (PORCINE) 5000 UNIT/ML IJ SOLN
5000.0000 [IU] | Freq: Three times a day (TID) | INTRAMUSCULAR | Status: DC
  Administered 2018-05-02 – 2018-05-06 (×14): 5000 [IU] via SUBCUTANEOUS
  Filled 2018-05-02 (×14): qty 1

## 2018-05-02 MED ORDER — MORPHINE SULFATE (PF) 4 MG/ML IV SOLN
4.0000 mg | Freq: Once | INTRAVENOUS | Status: AC
  Administered 2018-05-02: 4 mg via INTRAVENOUS
  Filled 2018-05-02: qty 1

## 2018-05-02 MED ORDER — DELFLEX-LC/2.5% DEXTROSE 394 MOSM/L IP SOLN
INTRAVENOUS_CENTRAL | Status: DC
  Administered 2018-05-03: 21:00:00 via INTRAPERITONEAL
  Filled 2018-05-02 (×5): qty 3000

## 2018-05-02 MED ORDER — POLYETHYLENE GLYCOL 3350 17 G PO PACK
17.0000 g | PACK | Freq: Every day | ORAL | Status: DC
  Administered 2018-05-02 – 2018-05-06 (×3): 17 g via ORAL
  Filled 2018-05-02 (×3): qty 1

## 2018-05-02 MED ORDER — MORPHINE SULFATE (PF) 2 MG/ML IV SOLN
2.0000 mg | Freq: Once | INTRAVENOUS | Status: AC
  Administered 2018-05-02: 2 mg via INTRAVENOUS
  Filled 2018-05-02: qty 1

## 2018-05-02 MED ORDER — ACETAMINOPHEN 650 MG RE SUPP: 650.0000 mg | Freq: Four times a day (QID) | RECTAL | Status: DC | PRN

## 2018-05-02 MED ORDER — CALCIUM ACETATE (PHOS BINDER) 667 MG PO CAPS
2001.0000 mg | ORAL_CAPSULE | Freq: Three times a day (TID) | ORAL | Status: DC
  Administered 2018-05-02 – 2018-05-06 (×11): 2001 mg via ORAL
  Filled 2018-05-02 (×12): qty 3

## 2018-05-02 MED ORDER — DOXYCYCLINE HYCLATE 100 MG PO TABS
100.0000 mg | ORAL_TABLET | Freq: Two times a day (BID) | ORAL | Status: DC
  Administered 2018-05-02 – 2018-05-04 (×5): 100 mg via ORAL
  Filled 2018-05-02 (×5): qty 1

## 2018-05-02 MED ORDER — SODIUM CHLORIDE 0.9 % IV SOLN
1.0000 g | Freq: Once | INTRAVENOUS | Status: AC
  Administered 2018-05-02: 1 g via INTRAVENOUS
  Filled 2018-05-02: qty 1

## 2018-05-02 MED ORDER — IRBESARTAN 150 MG PO TABS
300.0000 mg | ORAL_TABLET | Freq: Every day | ORAL | Status: DC
  Administered 2018-05-03 – 2018-05-05 (×3): 300 mg via ORAL
  Filled 2018-05-02 (×4): qty 2

## 2018-05-02 MED ORDER — GENTAMICIN SULFATE 0.1 % EX CREA
1.0000 "application " | TOPICAL_CREAM | Freq: Every day | CUTANEOUS | Status: DC
  Administered 2018-05-02 – 2018-05-04 (×4): 1 via TOPICAL
  Filled 2018-05-02 (×2): qty 15

## 2018-05-02 MED ORDER — INSULIN ASPART 100 UNIT/ML ~~LOC~~ SOLN
0.0000 [IU] | Freq: Three times a day (TID) | SUBCUTANEOUS | Status: DC
  Administered 2018-05-02: 1 [IU] via SUBCUTANEOUS
  Administered 2018-05-03: 2 [IU] via SUBCUTANEOUS
  Administered 2018-05-03 – 2018-05-04 (×2): 1 [IU] via SUBCUTANEOUS
  Administered 2018-05-04: 3 [IU] via SUBCUTANEOUS
  Administered 2018-05-05: 1 [IU] via SUBCUTANEOUS
  Administered 2018-05-05: 3 [IU] via SUBCUTANEOUS
  Administered 2018-05-06: 2 [IU] via SUBCUTANEOUS
  Administered 2018-05-06: 3 [IU] via SUBCUTANEOUS
  Filled 2018-05-02 (×9): qty 1

## 2018-05-02 MED ORDER — VANCOMYCIN HCL IN DEXTROSE 1-5 GM/200ML-% IV SOLN
1000.0000 mg | Freq: Once | INTRAVENOUS | Status: AC
  Administered 2018-05-02: 1000 mg via INTRAVENOUS
  Filled 2018-05-02: qty 200

## 2018-05-02 MED ORDER — LEVOFLOXACIN IN D5W 500 MG/100ML IV SOLN
500.0000 mg | INTRAVENOUS | Status: DC
  Filled 2018-05-02: qty 100

## 2018-05-02 MED ORDER — LABETALOL HCL 5 MG/ML IV SOLN: 5.0000 mg | INTRAVENOUS | Status: DC | PRN

## 2018-05-02 MED ORDER — ACETAMINOPHEN 325 MG PO TABS
650.0000 mg | ORAL_TABLET | Freq: Four times a day (QID) | ORAL | Status: DC | PRN
  Administered 2018-05-02: 650 mg via ORAL
  Filled 2018-05-02: qty 2

## 2018-05-02 MED ORDER — DOCUSATE SODIUM 100 MG PO CAPS
100.0000 mg | ORAL_CAPSULE | Freq: Two times a day (BID) | ORAL | Status: DC
  Administered 2018-05-02 – 2018-05-06 (×5): 100 mg via ORAL
  Filled 2018-05-02 (×8): qty 1

## 2018-05-02 MED ORDER — ONDANSETRON HCL 4 MG PO TABS: 4.0000 mg | ORAL_TABLET | Freq: Four times a day (QID) | ORAL | Status: DC | PRN

## 2018-05-02 MED ORDER — ONDANSETRON HCL 4 MG/2ML IJ SOLN
4.0000 mg | Freq: Four times a day (QID) | INTRAMUSCULAR | Status: DC | PRN
  Administered 2018-05-02 – 2018-05-05 (×2): 4 mg via INTRAVENOUS
  Filled 2018-05-02 (×4): qty 2

## 2018-05-02 MED ORDER — LEVOFLOXACIN 500 MG PO TABS
500.0000 mg | ORAL_TABLET | ORAL | Status: DC
  Administered 2018-05-04: 500 mg via ORAL
  Filled 2018-05-02 (×2): qty 1

## 2018-05-02 MED ORDER — LEVOFLOXACIN IN D5W 750 MG/150ML IV SOLN
750.0000 mg | Freq: Once | INTRAVENOUS | Status: AC
  Administered 2018-05-02: 750 mg via INTRAVENOUS
  Filled 2018-05-02: qty 150

## 2018-05-02 MED ORDER — VANCOMYCIN HCL 10 G IV SOLR: 1.0000 g | Freq: Once | INTRAVENOUS | Status: DC

## 2018-05-02 MED ORDER — DELFLEX-LC/2.5% DEXTROSE 394 MOSM/L IP SOLN
INTRAPERITONEAL | Status: DC
  Filled 2018-05-02: qty 3000

## 2018-05-02 MED ORDER — CARVEDILOL 6.25 MG PO TABS
6.2500 mg | ORAL_TABLET | Freq: Every day | ORAL | Status: DC
  Administered 2018-05-02 – 2018-05-06 (×5): 6.25 mg via ORAL
  Filled 2018-05-02 (×5): qty 1

## 2018-05-02 MED ORDER — BISACODYL 5 MG PO TBEC: 10.0000 mg | DELAYED_RELEASE_TABLET | Freq: Every day | ORAL | Status: DC | PRN

## 2018-05-02 MED ORDER — INSULIN ASPART 100 UNIT/ML ~~LOC~~ SOLN: 0.0000 [IU] | Freq: Every day | SUBCUTANEOUS | Status: DC

## 2018-05-02 MED ORDER — FLUTICASONE PROPIONATE 50 MCG/ACT NA SUSP
2.0000 | Freq: Every day | NASAL | Status: DC
  Administered 2018-05-05 – 2018-05-06 (×2): 2 via NASAL
  Filled 2018-05-02 (×2): qty 16

## 2018-05-02 MED ORDER — DELFLEX-LC/2.5% DEXTROSE 394 MOSM/L IP SOLN
Freq: Every day | INTRAPERITONEAL | Status: DC
  Administered 2018-05-03: 15000 mL via INTRAPERITONEAL
  Filled 2018-05-02 (×25): qty 3000

## 2018-05-02 MED ORDER — ONDANSETRON HCL 4 MG/2ML IJ SOLN
4.0000 mg | Freq: Once | INTRAMUSCULAR | Status: AC
  Administered 2018-05-02: 4 mg via INTRAVENOUS
  Filled 2018-05-02: qty 2

## 2018-05-02 NOTE — Progress Notes (Signed)
Family Meeting Note  Advance Directive:yes  Today a meeting took place with the Patient.     The following clinical team members were present during this meeting:MD  The following were discussed:Patient's diagnosis: Severe acute abdominal pain, on peritoneal dialysis, recent history of spontaneous bacterial peritonitis, end-stage renal disease on peritoneal dialysis, hypertension, diabetes mellitus and other medical problems and treatment plan of care discussed in detail with the patient.  He verbalized understanding of the plan  patient's progosis: Unable to determine and Goals for treatment: [employer] power of attorney mom Verdis Frederickson Additional follow-up to be provided: Hospitalist, nephrology  Time spent during discussion:16 min  Nicholes Mango, MD

## 2018-05-02 NOTE — Progress Notes (Signed)
[facility]  ROUNDING NOTE   Subjective:   Mr. Manjot Hinks admitted to [facility] on 05/01/2018 for Generalized abdominal pain [R10.84] SBP (spontaneous bacterial peritonitis) [facility]) [K65.2]  Patient was admitted to [facility] from 11/12 to 115. Found to have pseudomonas peritonitis. He was discharged with antibiotics. However patient returns to [facility] with abdominal pain. Denies any fevers. States his peritoneal dialysis is going well.     Objective:  Vital signs in last 24 hours:  Temp:  [97.6 F (36.4 C)-99.7 F (37.6 C)] 98.8 F (37.1 C) (11/19 1211) Pulse Rate:  [83-92] 92 (11/19 1211) Resp:  [16-22] 18 (11/19 1211) BP: (127-147)/(55-72) 127/65 (11/19 1211) SpO2:  [93 %-98 %] 98 % (11/19 1211) Weight:  [108.9 kg-113.9 kg] 113.9 kg (11/19 0320)  Weight change:  Filed Weights   05/02/18 0002 05/02/18 0320  Weight: 108.9 kg 113.9 kg    Intake/Output: I/O last 3 completed shifts: In: 253.5 [IV Piggyback:253.5] Out: 0    Intake/Output this shift:  No intake/output data recorded.  Physical Exam: General: NAD,   Head: Normocephalic, atraumatic. Moist oral mucosal membranes  Eyes: Anicteric, PERRL  Neck: Supple, trachea midline  Lungs:  Clear to auscultation  Heart: Regular rate and rhythm  Abdomen:  Tender right lower quadrant  Extremities: no peripheral edema.  Neurologic: Nonfocal, moving all four extremities  Skin: No lesions  Access: PD catheter    Basic Metabolic Panel: Recent Labs  Lab 05/02/18 0007  NA 130*  K 3.9  CL 86*  CO2 28  GLUCOSE 163*  BUN 57*  CREATININE 10.75*  CALCIUM 9.3    Liver Function Tests: Recent Labs  Lab 05/02/18 0007  AST 43*  ALT 40  ALKPHOS 281*  BILITOT 0.9  PROT 7.2  ALBUMIN 2.6*   Recent Labs  Lab 05/02/18 0007  LIPASE 28   No results for input(s): AMMONIA in the last 168 hours.  CBC: Recent Labs  Lab 05/02/18 0007  WBC 12.2*  NEUTROABS 10.1*  HGB 8.2*  HCT 25.8*  MCV 102.8*  PLT 229     Cardiac Enzymes: No results for input(s): CKTOTAL, CKMB, CKMBINDEX, TROPONINI in the last 168 hours.  BNP: Invalid input(s): POCBNP  CBG: Recent Labs  Lab 05/02/18 0757 05/02/18 1122  GLUCAP 122* 17    Microbiology: Results for orders placed or performed during the hospital encounter of 05/01/18  MRSA PCR Screening     Status: None   Collection Time: 05/02/18  8:21 AM  Result Value Ref Range Status   MRSA by PCR NEGATIVE NEGATIVE Final    Comment:        The GeneXpert MRSA Assay (FDA approved for NASAL specimens only), is one component of a comprehensive MRSA colonization surveillance program. It is not intended to diagnose MRSA infection nor to guide or monitor treatment for MRSA infections. Performed at [facility], [address] [place], [place] [address]     Coagulation Studies: No results for input(s): LABPROT, INR in the last 72 hours.  Urinalysis: No results for input(s): COLORURINE, LABSPEC, PHURINE, GLUCOSEU, HGBUR, BILIRUBINUR, KETONESUR, PROTEINUR, UROBILINOGEN, NITRITE, LEUKOCYTESUR in the last 72 hours.  Invalid input(s): APPERANCEUR    Imaging: No results found.   Medications:    . amLODipine  10 mg Oral BID  . calcium acetate  2,001 mg Oral TID WC  . carvedilol  6.25 mg Oral Daily  . docusate sodium  100 mg Oral BID  . fluticasone  2 spray Each Nare Daily  .  heparin  5,000 Units Subcutaneous Q8H  . insulin aspart  0-5 Units Subcutaneous QHS  . insulin aspart  0-9 Units Subcutaneous TID WC  . irbesartan  300 mg Oral QHS  . polyethylene glycol  17 g Oral Daily   acetaminophen **OR** acetaminophen, labetalol, morphine injection, ondansetron **OR** ondansetron (ZOFRAN) IV  Assessment/ Plan:  Mr. Aleph Nickson is a 47 y.o. Hispanic male with end stage renal disease on peritoneal dialysis, hypertension, insulin dependent diabetes mellitus type II, diabetic neuropathy, diabetic retinopathy, hyperlipidemia  [facility] Payor  PD EDW 111kg.  CCPD 10 hours 3 liter fills 4 exchanges with last fill 2.5 liters.   1. End Stage Renal Disease: on peritoneal dialysis Peritoneal dialysis scheduled for tonight. Orders prepared. 2.5% dextrose.   2. Peritonitis: pseudomonas aeruginosa on 11/13 at [facility]. Discharged on ceftaxidime 1.5g IP daily and levofloxacin 500mg  every other day.  Currently with leukocytosis Antibiotics expanded for broad coverage. However needs direct anti-pseudomonal coverage. Recommend changing back to previous above regimen.   3. Hypertension: blood pressure at goal.  - amlodipine, carvedilol, irbestartan, labetalol  4. Anemia of chronic kidney disease: hemoglobin 8.2 - EPO as outpatient. Last dose was yesterday, 11/18  5. Secondary Hyperparathyroidism with hyperphosphatemia. Outpatient PTH was low at 97 on 11/5. Not currently on vitamin D agent.  11/5: phosphorus 8.9 - continue calcium acetate.    LOS: 0 Mizael Sagar 11/19/20191:20 PM

## 2018-05-02 NOTE — Progress Notes (Signed)
Per Dr. Anselm Jungling okay for RN to place one time order for 2 mg iv morphine. Pt is complaining of abd pain.

## 2018-05-02 NOTE — Progress Notes (Signed)
[place] [facility] [place] NAME: Jake Gomez    MR#:  [id number]  DATE OF BIRTH:  09-10-1970  SUBJECTIVE:  CHIEF COMPLAINT: Patient is reporting right lower quadrant abdominal pain 6 out of 10 during my examination.  Denies any nausea vomiting.  Had a bowel movement but thinks he is constipated and asking for stool softeners  REVIEW OF SYSTEMS:  CONSTITUTIONAL: No fever, fatigue or weakness.  EYES: No blurred or double vision.  EARS, NOSE, AND THROAT: No tinnitus or ear pain.  RESPIRATORY: No cough, shortness of breath, wheezing or hemoptysis.  CARDIOVASCULAR: No chest pain, orthopnea, edema.  GASTROINTESTINAL: No nausea, vomiting, diarrhea.  Reporting 6 out of 10 abdominal pain.  GENITOURINARY: No dysuria, hematuria.  ENDOCRINE: No polyuria, nocturia,  HEMATOLOGY: No anemia, easy bruising or bleeding SKIN: No rash or lesion. MUSCULOSKELETAL: No joint pain or arthritis.   NEUROLOGIC: No tingling, numbness, weakness.  PSYCHIATRY: No anxiety or depression.   DRUG ALLERGIES:  No Known Allergies  VITALS:  Blood pressure 127/65, pulse 92, temperature 98.8 F (37.1 C), temperature source Oral, resp. rate 18, height 5\' 9"  (1.753 m), weight 113.9 kg, SpO2 98 %.  PHYSICAL EXAMINATION:  GENERAL:  47 y.o.-year-old patient lying in the bed with no acute distress.  EYES: Pupils equal, round, reactive to light and accommodation. No scleral icterus. Extraocular muscles intact.  HEENT: Head atraumatic, normocephalic. Oropharynx and nasopharynx clear.  NECK:  Supple, no jugular venous distention. No thyroid enlargement, no tenderness.  LUNGS: Normal breath sounds bilaterally, no wheezing, rales,rhonchi or crepitation. No use of accessory muscles of respiration.  CARDIOVASCULAR: S1, S2 normal. No murmurs, rubs, or gallops.  ABDOMEN: Soft, right lower quadrant tenderness is present no rebound tenderness, nondistended. Bowel sounds present.  PD catheter  intact EXTREMITIES: No pedal edema, cyanosis, or clubbing.  NEUROLOGIC: Cranial nerves II through XII are intact. Muscle strength 5/5 in all extremities. Sensation intact. Gait not checked.  PSYCHIATRIC: The patient is alert and oriented x 3.  SKIN: No obvious rash, lesion, or ulcer.    LABORATORY PANEL:   CBC Recent Labs  Lab 05/02/18 0007  WBC 12.2*  HGB 8.2*  HCT 25.8*  PLT 229   ------------------------------------------------------------------------------------------------------------------  Chemistries  Recent Labs  Lab 05/02/18 0007  NA 130*  K 3.9  CL 86*  CO2 28  GLUCOSE 163*  BUN 57*  CREATININE 10.75*  CALCIUM 9.3  AST 43*  ALT 40  ALKPHOS 281*  BILITOT 0.9   ------------------------------------------------------------------------------------------------------------------  Cardiac Enzymes No results for input(s): TROPONINI in the last 168 hours. ------------------------------------------------------------------------------------------------------------------  RADIOLOGY:  No results found.  EKG:   Orders placed or performed during the hospital encounter of 06/10/17  . EKG 12-Lead  . EKG 12-Lead  . EKG 12-Lead  . EKG 12-Lead  . ED EKG within 10 minutes  . ED EKG within 10 minutes  . EKG    ASSESSMENT AND PLAN:   This is a 47 year old male admitted for abdominal pain. 1.  Acute Abdominal pain: Intractable; with recent history of incomplete treatment for SBP  IV morphine as needed for severe pain. [facility] is recommending to continue ceftazidime and levofloxacin as per my discussion with nephrology  We will follow-up with nephrology regarding guidance on collecting dialysate cultures and to advise on catheter function/infection. 2.  ESRD: On peritoneal dialysis.  Plan as above.  PhosLo with meals 3.  Hypertension: Reasonably controlled; continue amlodipine, irbesartan and carvedilol.  Labetalol as needed 4.  Diabetes mellitus type 2: Sliding scale  insulin while hospitalized. 5.  Constipation-continue Colace and MiraLAX.  Dulcolax as needed 5.  DVT prophylaxis: Heparin 6.  GI prophylaxis: None     All the records are reviewed and case discussed with Care Management/Social Workerr. Management plans discussed with the patient, family and they are in agreement.  CODE STATUS: fc   TOTAL TIME TAKING CARE OF THIS PATIENT: 35  minutes.   POSSIBLE D/C IN 1-2  DAYS, DEPENDING ON CLINICAL CONDITION.  Note: This dictation was prepared with Dragon dictation along with smaller phrase technology. Any transcriptional errors that result from this process are unintentional.   Nicholes Mango M.D on 05/02/2018 at 2:17 PM  Between 7am to 6pm - Pager - [phone] After 6pm go to [email or web address] - password [email or web address] [facility]  Office  [phone]  CC: Primary care physician; Juline Patch, MD

## 2018-05-02 NOTE — ED Provider Notes (Signed)
[facility] Emergency Department Provider Note ___________________________   First MD Initiated Contact with Patient 05/02/18 0001     (approximate)  I have reviewed the triage vital signs and the nursing notes.   HISTORY  Chief Complaint Abdominal Pain    HPI Jake Gomez is a 47 y.o. male below list of chronic medical conditions including end-stage renal disease on peritoneal dialysis with recent diagnosis of spontaneous bacterial peritonitis for which patient was admitted to [facility] and discharged 3 days ago presents to the emergency department for worsening abdominal pain which began tonight.  Patient states that his pain was initially controlled oxycodone at home however that despite taking oxycodone as prescribed his pain is progressively worsened over the course of today.  Patient denies any fever afebrile on presentation.  Patient denies any nausea or vomiting.  Patient is currently taking "doxycycline" at home.  Patient states that he was notified by [facility] that if pain recurs or worsen that he should follow-up here at [facility] as his nephrologist or here in [facility]  Patient states that he saw Dr. Candiss Norse in clinic yesterday and was advised that he would be directly admitted pain were to worsen.   Past Medical History:  Diagnosis Date  . Diabetes mellitus without complication ([place])   . Hypertension   . Renal disorder     Patient Active Problem List   Diagnosis Date Noted  . Chest pain 05/26/2017  . ESRD on peritoneal dialysis ([place]) 05/26/2017  . Chronic renal disease, stage 4, severely decreased glomerular filtration rate between 15-29 mL/min/1.73 square meter ([place]) 01/30/2014  . Acute-on-chronic renal failure ([place]) 09/29/2013  . Diabetes mellitus ([place]) 09/29/2013  . Hyperkalemia, diminished renal excretion 09/29/2013  . Hypertension 09/29/2013    Past Surgical History:  Procedure Laterality Date  . peritonal dialysis      Prior  to Admission medications   Medication Sig Start Date End Date Taking? Authorizing Provider  acetaminophen (TYLENOL) 500 MG tablet Take 500 mg by mouth.    [provider]  amLODipine (NORVASC) 5 MG tablet Take 10 mg by mouth 2 (two) times daily.  02/02/14   [provider]  atorvastatin (LIPITOR) 80 MG tablet Take 80 mg by mouth. 08/11/17   [provider]  bumetanide (BUMEX) 2 MG tablet Take 2 mg by mouth daily.  02/16/17   [provider]  calcium acetate (PHOSLO) 667 MG capsule Take 2,001 mg by mouth 3 (three) times daily with meals.  05/27/17   [provider]  carvedilol (COREG) 6.25 MG tablet Take 6.25 mg by mouth daily. 02/28/18   [provider]  fluticasone (FLONASE) 50 MCG/ACT nasal spray Place 2 sprays into both nostrils daily. 02/14/18   Juline Patch, MD  glipiZIDE (GLUCOTROL) 5 MG tablet Take 1 tablet (5 mg total) by mouth 2 (two) times daily before a meal. 02/14/18   Juline Patch, MD  insulin regular (NOVOLIN R RELION) 100 units/mL injection Inject 0.02 mLs (2 Units total) into the skin daily. 02/14/18   Juline Patch, MD  Insulin Syringes, Disposable, U-100 1 ML MISC 1 each by Does not apply route daily. 12/29/17   Juline Patch, MD  irbesartan (AVAPRO) 300 MG tablet Take 300 mg by mouth at bedtime.  05/14/17   [provider]  labetalol (NORMODYNE) 200 MG tablet Take 200 mg by mouth 2 (two) times daily.     [provider]  RELION INSULIN SYRINGE 1ML/31G 31G X  5/16" 1 ML MISC 1 EACH BY DOSE NOT APPLY ROUTE DAILY 12/29/17   [provider]  traZODone (DESYREL) 50 MG tablet Take 50 mg by mouth at bedtime. Dr Candiss Norse 01/09/18   [provider]    Allergies No known drug allergies  Family History  Problem Relation Age of Onset  . Thyroid disease Mother   . Stroke Father   . Cancer Father   . Diabetes Father     Social History Social History   Tobacco Use  . Smoking status: Former Research scientist (life sciences)  .  Smokeless tobacco: Former Systems developer    Quit date: 07/16/2009  Substance Use Topics  . Alcohol use: No    Frequency: Never  . Drug use: No    Review of Systems Constitutional: No fever/chills Eyes: No visual changes. ENT: No sore throat. Cardiovascular: Denies chest pain. Respiratory: Denies shortness of breath. Gastrointestinal: Positive for abdominal pain.  No nausea, no vomiting.  No diarrhea.  No constipation. Genitourinary: Negative for dysuria. Musculoskeletal: Negative for neck pain.  Negative for back pain. Integumentary: Negative for rash. Neurological: Negative for headaches, focal weakness or numbness.   ____________________________________________   PHYSICAL EXAM:  VITAL SIGNS: ED Triage Vitals  Enc Vitals Group     BP 05/02/18 0001 136/62     Pulse Rate 05/02/18 0001 83     Resp 05/02/18 0001 (!) 22     Temp 05/02/18 0001 97.6 F (36.4 C)     Temp Source 05/02/18 0001 Oral     SpO2 05/02/18 0001 95 %     Weight 05/02/18 0002 108.9 kg (240 lb)     Height 05/02/18 0002 1.753 m (5\' 9" )     Head Circumference --      Peak Flow --      Pain Score 05/02/18 0002 10     Pain Loc --      Pain Edu? --      Excl. in [place]? --     Constitutional: Alert and oriented.  Apparent discomfort  eyes: Conjunctivae are normal.  Mouth/Throat: Mucous membranes are moist. Oropharynx non-erythematous. Neck: No stridor.   Cardiovascular: Normal rate, regular rhythm. Good peripheral circulation. Grossly normal heart sounds. Respiratory: Normal respiratory effort.  No retractions. Lungs CTAB. Gastrointestinal: Generalized tenderness to palpation.. No distention.  Musculoskeletal: No lower extremity tenderness nor edema. No gross deformities of extremities. Neurologic:  Normal speech and language. No gross focal neurologic deficits are appreciated.  Skin:  Skin is warm, dry and intact. No rash noted. Psychiatric: Mood and affect are normal. Speech and behavior are  normal.  ____________________________________________   LABS (all labs ordered are listed, but only abnormal results are displayed)  Labs Reviewed  CBC WITH DIFFERENTIAL/PLATELET - Abnormal; Notable for the following components:      Result Value   WBC 12.2 (*)    RBC 2.51 (*)    Hemoglobin 8.2 (*)    HCT 25.8 (*)    MCV 102.8 (*)    RDW 17.0 (*)    Neutro Abs 10.1 (*)    Monocytes Absolute 1.1 (*)    Abs Immature Granulocytes 0.22 (*)    All other components within normal limits  COMPREHENSIVE METABOLIC PANEL - Abnormal; Notable for the following components:   Sodium 130 (*)    Chloride 86 (*)    Glucose, Bld 163 (*)    BUN 57 (*)    Creatinine, Ser 10.75 (*)    Albumin 2.6 (*)    AST 43 (*)  Alkaline Phosphatase 281 (*)    GFR calc non Af Amer 5 (*)    GFR calc Af Amer 6 (*)    Anion gap 16 (*)    All other components within normal limits  LIPASE, BLOOD     Procedures   ____________________________________________   INITIAL IMPRESSION / ASSESSMENT AND PLAN / ED COURSE  As part of my medical decision making, I reviewed the following data within the electronic MEDICAL RECORD NUMBER   47 year old male presenting with above-stated history and physical exam secondary to abdominal pain recently diagnosed spontaneous bacterial peritonitis.  Given worsening pain concern for possible recurrence.  As per Dr. Keturah Barre recommendation to the patient I will admit the patient to the hospital.  Patient discussed with Dr. Marcille Blanco hospital admission for further evaluation and management of possible recurrence unresolved spontaneous bacterial peritonitis.  Patient given IV vancomycin and cefepime in the emergency department. ____________________________________________  FINAL CLINICAL IMPRESSION(S) / ED DIAGNOSES  Final diagnoses:  SBP (spontaneous bacterial peritonitis) ([place])  Generalized abdominal pain     MEDICATIONS GIVEN DURING THIS VISIT:  Medications  morphine 4 MG/ML  injection 4 mg (4 mg Intravenous Given 05/02/18 0010)  ondansetron (ZOFRAN) injection 4 mg (4 mg Intravenous Given 05/02/18 0010)     ED Discharge Orders    None       Note:  This document was prepared using Dragon voice recognition software and may include unintentional dictation errors.    Gregor Hams, MD 05/02/18 [phone]

## 2018-05-02 NOTE — H&P (Signed)
Jake Gomez is an 47 y.o. male.   Chief Complaint: Abdominal pain HPI: Patient with past medical history of diabetes and hypertension as well as end-stage renal disease on peritoneal dialysis presents to the emergency department complaining of abdominal pain.  The patient reports that he was discharged from [facility] 4 days ago after treatment of spontaneous bacterial peritonitis.  He reports that he was weaned from IV analgesia to oral narcotics prior to discharge but never was completely pain-free.  He began to have some nausea and vomiting which was nonbloody and nonbilious.  His nausea has improved with antiemetics.  He denies fever but continues to complain of tenderness in the abdomen.  His PD catheter was not exchanged.  The patient has been able to partially drain from his last peritoneal dialysis treatment was unable to complete the run due to unbearable pain which prompted him to seek patient in the emergency department.  The patient was stable but due to concern for peritonitis emergency department staff called the hospitalist service for admission.  Past Medical History:  Diagnosis Date  . Diabetes mellitus without complication ([place])   . Hypertension   . Renal disorder     Past Surgical History:  Procedure Laterality Date  . peritonal dialysis      Family History  Problem Relation Age of Onset  . Thyroid disease Mother   . Stroke Father   . Cancer Father   . Diabetes Father    Social History:  reports that he has quit smoking. He quit smokeless tobacco use about 8 years ago. He reports that he does not drink alcohol or use drugs.  Allergies: No Known Allergies  Medications Prior to Admission  Medication Sig Dispense Refill  . acetaminophen (TYLENOL) 500 MG tablet Take 500 mg by mouth.    Marland Kitchen amLODipine (NORVASC) 5 MG tablet Take 10 mg by mouth 2 (two) times daily.     Marland Kitchen atorvastatin (LIPITOR) 80 MG tablet Take 80 mg by mouth.    . bumetanide (BUMEX) 2 MG tablet Take 2  mg by mouth daily.     . calcium acetate (PHOSLO) 667 MG capsule Take 2,001 mg by mouth 3 (three) times daily with meals.     . carvedilol (COREG) 6.25 MG tablet Take 6.25 mg by mouth daily.  5  . fluticasone (FLONASE) 50 MCG/ACT nasal spray Place 2 sprays into both nostrils daily. 16 g 6  . glipiZIDE (GLUCOTROL) 5 MG tablet Take 1 tablet (5 mg total) by mouth 2 (two) times daily before a meal. 60 tablet 1  . insulin regular (NOVOLIN R RELION) 100 units/mL injection Inject 0.02 mLs (2 Units total) into the skin daily. 10 mL 4  . Insulin Syringes, Disposable, U-100 1 ML MISC 1 each by Does not apply route daily. 100 each 0  . irbesartan (AVAPRO) 300 MG tablet Take 300 mg by mouth at bedtime.     Marland Kitchen labetalol (NORMODYNE) 200 MG tablet Take 200 mg by mouth 2 (two) times daily.     Marland Kitchen RELION INSULIN SYRINGE 1ML/31G 31G X 5/16" 1 ML MISC 1 EACH BY DOSE NOT APPLY ROUTE DAILY  0  . traZODone (DESYREL) 50 MG tablet Take 50 mg by mouth at bedtime. Dr Candiss Norse  0    Results for orders placed or performed during the hospital encounter of 05/01/18 (from the past 48 hour(s))  CBC with Differential     Status: Abnormal   Collection Time: 05/02/18 12:07 AM  Result Value  Ref Range   WBC 12.2 (H) 4.0 - 10.5 K/uL   RBC 2.51 (L) 4.22 - 5.81 MIL/uL   Hemoglobin 8.2 (L) 13.0 - 17.0 g/dL   HCT 25.8 (L) 39.0 - 52.0 %   MCV 102.8 (H) 80.0 - 100.0 fL   MCH 32.7 26.0 - 34.0 pg   MCHC 31.8 30.0 - 36.0 g/dL   RDW 17.0 (H) 11.5 - 15.5 %   Platelets 229 150 - 400 K/uL   nRBC 0.0 0.0 - 0.2 %   Neutrophils Relative % 82 %   Neutro Abs 10.1 (H) 1.7 - 7.7 K/uL   Lymphocytes Relative 6 %   Lymphs Abs 0.7 0.7 - 4.0 K/uL   Monocytes Relative 9 %   Monocytes Absolute 1.1 (H) 0.1 - 1.0 K/uL   Eosinophils Relative 1 %   Eosinophils Absolute 0.1 0.0 - 0.5 K/uL   Basophils Relative 0 %   Basophils Absolute 0.0 0.0 - 0.1 K/uL   Immature Granulocytes 2 %   Abs Immature Granulocytes 0.22 (H) 0.00 - 0.07 K/uL    Comment:  Performed at [facility], [address] [place], [place] [address]  Comprehensive metabolic panel     Status: Abnormal   Collection Time: 05/02/18 12:07 AM  Result Value Ref Range   Sodium 130 (L) 135 - 145 mmol/L   Potassium 3.9 3.5 - 5.1 mmol/L   Chloride 86 (L) 98 - 111 mmol/L   CO2 28 22 - 32 mmol/L   Glucose, Bld 163 (H) 70 - 99 mg/dL   BUN 57 (H) 6 - 20 mg/dL   Creatinine, Ser 10.75 (H) 0.61 - 1.24 mg/dL   Calcium 9.3 8.9 - 10.3 mg/dL   Total Protein 7.2 6.5 - 8.1 g/dL   Albumin 2.6 (L) 3.5 - 5.0 g/dL   AST 43 (H) 15 - 41 U/L   ALT 40 0 - 44 U/L   Alkaline Phosphatase 281 (H) 38 - 126 U/L   Total Bilirubin 0.9 0.3 - 1.2 mg/dL   GFR calc non Af Amer 5 (L) >60 mL/min   GFR calc Af Amer 6 (L) >60 mL/min    Comment: (NOTE) The eGFR has been calculated using the CKD EPI equation. This calculation has not been validated in all clinical situations. eGFR's persistently <60 mL/min signify possible Chronic Kidney Disease.    Anion gap 16 (H) 5 - 15    Comment: Performed at [facility], [address] [place], [place] [address]  Lipase, blood     Status: None   Collection Time: 05/02/18 12:07 AM  Result Value Ref Range   Lipase 28 11 - 51 U/L    Comment: Performed at [facility], [address] [place], [place] [address]  TSH     Status: None   Collection Time: 05/02/18 12:07 AM  Result Value Ref Range   TSH 2.788 0.350 - 4.500 uIU/mL    Comment: Performed by a 3rd Generation assay with a functional sensitivity of <=0.01 uIU/mL. Performed at [facility], [address] [place], [place] [address]    No results found.  Review of Systems  Constitutional: Negative for chills and fever.  HENT: Negative for sore throat and tinnitus.   Eyes: Negative for blurred vision and redness.  Respiratory: Negative for cough and shortness of breath.   Cardiovascular: Negative for chest pain, palpitations, orthopnea and PND.  Gastrointestinal:  Positive for abdominal pain, nausea and vomiting. Negative for diarrhea.  Genitourinary: Negative  for dysuria, frequency and urgency.  Musculoskeletal: Negative for joint pain and myalgias.  Skin: Negative for rash.       No lesions  Neurological: Negative for speech change, focal weakness and weakness.  Endo/Heme/Allergies: Does not bruise/bleed easily.       No temperature intolerance  Psychiatric/Behavioral: Negative for depression and suicidal ideas.    Blood pressure (!) 144/64, pulse 87, temperature 99.7 F (37.6 C), temperature source Oral, resp. rate 16, height 5' 9" (1.753 m), weight 113.9 kg, SpO2 94 %. Physical Exam  Constitutional: He is oriented to person, place, and time. He appears well-developed and well-nourished. No distress.  HENT:  Head: Normocephalic and atraumatic.  Mouth/Throat: Oropharynx is clear and moist.  Eyes: Pupils are equal, round, and reactive to light. Conjunctivae and EOM are normal. No scleral icterus.  Neck: Normal range of motion. Neck supple. No JVD present. No tracheal deviation present. No thyromegaly present.  Cardiovascular: Normal rate and regular rhythm. Exam reveals no gallop and no friction rub.  No murmur heard. Respiratory: Effort normal and breath sounds normal. No respiratory distress.  GI: He exhibits distension. He exhibits no mass. There is no tenderness. There is no rebound and no guarding.  Genitourinary:  Genitourinary Comments: Deferred  Musculoskeletal: Normal range of motion. He exhibits no edema.  Lymphadenopathy:    He has no cervical adenopathy.  Neurological: He is alert and oriented to person, place, and time. No cranial nerve deficit.  Skin: Skin is warm and dry. No rash noted. No erythema.  Psychiatric: He has a normal mood and affect. His behavior is normal. Judgment and thought content normal.     Assessment/Plan This is a 47 year old male admitted for abdominal pain. 1.  Abdominal pain: Intractable; IV morphine  as needed for severe pain.  Concern for incomplete treatment of SBP.  Consult nephrology for guidance on collecting dialysate cultures and to advise on catheter function/infection. 2.  ESRD: On peritoneal dialysis.  Plan as above.  PhosLo with meals 3.  Hypertension: Reasonably controlled; continue amlodipine, irbesartan and carvedilol.  Labetalol as needed 4.  Diabetes mellitus type 2: Sliding scale insulin while hospitalized. 5.  DVT prophylaxis: Heparin 6.  GI prophylaxis: None The patient is a full code.  Time spent on admission orders and patient care approximately 45 minutes  Harrie Foreman, MD 05/02/2018, 7:33 AM

## 2018-05-02 NOTE — Progress Notes (Signed)
Pharmacy Antibiotic Note  Jake Gomez is a 47 y.o. male admitted on 05/01/2018 with pseudomonal peritonitis.  Pharmacy has been consulted for Levaquin dosing. He was admitted to [facility] from 11/12 to 11/15 and found to have pseudomonas peritonitis (positive culture available in care everywhere). He was discharged with po Levaquin but now returns to [facility] with abdominal pain.  Plan: Levaquin 750mg  IV now, then 500mg  IV every 48 hours  Height: 5\' 9"  (175.3 cm) Weight: 251 lb (113.9 kg) IBW/kg (Calculated) : 70.7  Temp (24hrs), Avg:98.7 F (37.1 C), Min:97.6 F (36.4 C), Max:99.7 F (37.6 C)  Recent Labs  Lab 05/02/18 0007  WBC 12.2*  CREATININE 10.75*    Estimated Creatinine Clearance: 10.8 mL/min (A) (by C-G formula based on SCr of 10.75 mg/dL (H)).    No Known Allergies  Antimicrobials this admission: ceftazadine 11/19 >> Levaquin 11/19 >>  Cefepime 11/18 x1  Microbiology results: 11/13 peritoneal fluid: pan-S  Thank you for allowing pharmacy to be a part of this patient's care.  Dallie Piles, PharmD 05/02/2018 1:59 PM

## 2018-05-02 NOTE — Progress Notes (Signed)
Per Dr. Juleen China okay per RN to place order for miralax daily.

## 2018-05-02 NOTE — Care Management (Signed)
Amanda Morris dialysis liaison notified of admission.    

## 2018-05-02 NOTE — Progress Notes (Signed)
PD tx start. Pre PD weight 114.7kg. Last fill contains medication additive, ceftazidime.    05/02/18 1746  Cycler Setup  Total Number of Exchanges 5  Fill Volume 3000  Dianeal Solution Dextrose 2.5% in 6000 mL  Last Bag Alarm 0  Dianeal Additive Other (Comment) (medication (antibiotic))  Last Fill Volume 2500  Fill Time - Minute(s) 10  Drain Time - Minute(s) 20 mins  Completion  Exit Site Care Performed Yes  Fluid Balance - CCPD  Total Intake for Exchanges (mL) 14500 ml  Procedure Comments  Peritoneal Dialysis Comments pre- PD weight 114.7kg  Education / Care Plan  Dialysis Education Provided Yes  Documented Education in Care Plan Yes  Hand-Off documentation  Report given to (Full Name) Elita Quick Rodriquez,RN  Report received from (Full Name) Stark Bray

## 2018-05-03 DIAGNOSIS — L03314 Cellulitis of groin: Secondary | ICD-10-CM | POA: Diagnosis present

## 2018-05-03 DIAGNOSIS — E119 Type 2 diabetes mellitus without complications: Secondary | ICD-10-CM | POA: Diagnosis not present

## 2018-05-03 DIAGNOSIS — R0902 Hypoxemia: Secondary | ICD-10-CM | POA: Diagnosis present

## 2018-05-03 DIAGNOSIS — Z87891 Personal history of nicotine dependence: Secondary | ICD-10-CM | POA: Diagnosis not present

## 2018-05-03 DIAGNOSIS — B965 Pseudomonas (aeruginosa) (mallei) (pseudomallei) as the cause of diseases classified elsewhere: Secondary | ICD-10-CM | POA: Diagnosis not present

## 2018-05-03 DIAGNOSIS — Z992 Dependence on renal dialysis: Secondary | ICD-10-CM | POA: Diagnosis not present

## 2018-05-03 DIAGNOSIS — Z79899 Other long term (current) drug therapy: Secondary | ICD-10-CM | POA: Diagnosis not present

## 2018-05-03 DIAGNOSIS — E785 Hyperlipidemia, unspecified: Secondary | ICD-10-CM | POA: Diagnosis present

## 2018-05-03 DIAGNOSIS — Z794 Long term (current) use of insulin: Secondary | ICD-10-CM | POA: Diagnosis not present

## 2018-05-03 DIAGNOSIS — I10 Essential (primary) hypertension: Secondary | ICD-10-CM | POA: Diagnosis not present

## 2018-05-03 DIAGNOSIS — R109 Unspecified abdominal pain: Secondary | ICD-10-CM | POA: Diagnosis not present

## 2018-05-03 DIAGNOSIS — Z833 Family history of diabetes mellitus: Secondary | ICD-10-CM | POA: Diagnosis not present

## 2018-05-03 DIAGNOSIS — E1140 Type 2 diabetes mellitus with diabetic neuropathy, unspecified: Secondary | ICD-10-CM | POA: Diagnosis present

## 2018-05-03 DIAGNOSIS — K652 Spontaneous bacterial peritonitis: Secondary | ICD-10-CM | POA: Diagnosis present

## 2018-05-03 DIAGNOSIS — R1084 Generalized abdominal pain: Secondary | ICD-10-CM | POA: Diagnosis not present

## 2018-05-03 DIAGNOSIS — Z7951 Long term (current) use of inhaled steroids: Secondary | ICD-10-CM | POA: Diagnosis not present

## 2018-05-03 DIAGNOSIS — N186 End stage renal disease: Secondary | ICD-10-CM | POA: Diagnosis present

## 2018-05-03 DIAGNOSIS — D509 Iron deficiency anemia, unspecified: Secondary | ICD-10-CM | POA: Diagnosis not present

## 2018-05-03 DIAGNOSIS — D631 Anemia in chronic kidney disease: Secondary | ICD-10-CM | POA: Diagnosis not present

## 2018-05-03 DIAGNOSIS — N2581 Secondary hyperparathyroidism of renal origin: Secondary | ICD-10-CM | POA: Diagnosis present

## 2018-05-03 DIAGNOSIS — I120 Hypertensive chronic kidney disease with stage 5 chronic kidney disease or end stage renal disease: Secondary | ICD-10-CM | POA: Diagnosis present

## 2018-05-03 DIAGNOSIS — E1122 Type 2 diabetes mellitus with diabetic chronic kidney disease: Secondary | ICD-10-CM | POA: Diagnosis not present

## 2018-05-03 DIAGNOSIS — M6281 Muscle weakness (generalized): Secondary | ICD-10-CM | POA: Diagnosis not present

## 2018-05-03 DIAGNOSIS — E11319 Type 2 diabetes mellitus with unspecified diabetic retinopathy without macular edema: Secondary | ICD-10-CM | POA: Diagnosis present

## 2018-05-03 DIAGNOSIS — K5900 Constipation, unspecified: Secondary | ICD-10-CM | POA: Diagnosis present

## 2018-05-03 DIAGNOSIS — R531 Weakness: Secondary | ICD-10-CM | POA: Diagnosis not present

## 2018-05-03 DIAGNOSIS — K659 Peritonitis, unspecified: Secondary | ICD-10-CM | POA: Diagnosis not present

## 2018-05-03 LAB — GLUCOSE, CAPILLARY
Glucose-Capillary: 151 mg/dL — ABNORMAL HIGH (ref 70–99)
Glucose-Capillary: 128 mg/dL — ABNORMAL HIGH (ref 70–99)
Glucose-Capillary: 108 mg/dL — ABNORMAL HIGH (ref 70–99)
Glucose-Capillary: 112 mg/dL — ABNORMAL HIGH (ref 70–99)

## 2018-05-03 LAB — PATHOLOGIST SMEAR REVIEW

## 2018-05-03 MED ORDER — HYDROMORPHONE HCL 1 MG/ML IJ SOLN
0.5000 mg | INTRAMUSCULAR | Status: DC | PRN
  Administered 2018-05-03 – 2018-05-05 (×8): 1 mg via INTRAVENOUS
  Administered 2018-05-05 (×2): 0.5 mg via INTRAVENOUS
  Administered 2018-05-06 (×3): 1 mg via INTRAVENOUS
  Filled 2018-05-03 (×12): qty 1

## 2018-05-03 MED ORDER — TRAMADOL HCL 50 MG PO TABS
50.0000 mg | ORAL_TABLET | Freq: Four times a day (QID) | ORAL | Status: DC | PRN
  Administered 2018-05-04 (×2): 50 mg via ORAL
  Filled 2018-05-03 (×2): qty 1

## 2018-05-03 MED ORDER — HYDROMORPHONE HCL 1 MG/ML IJ SOLN
INTRAMUSCULAR | Status: AC
  Filled 2018-05-03: qty 1

## 2018-05-03 NOTE — Evaluation (Signed)
Physical Therapy Evaluation Patient Details Name: Jake Gomez MRN: [id number] DOB: January 11, 1971 Today's Date: 05/03/2018   History of Present Illness  Pt is a 47 y/o M who presented with abdominal pain.  Pt recently d/c from [place] 4 days prior for treatment of bacterial peritonitis.  Pt's PMH includes peritoneal dialysis.      Clinical Impression  Pt admitted with above diagnosis. Pt currently with functional limitations due to the deficits listed below (see PT Problem List). Jake Gomez requires assist with bed mobility and sit>stand transfers at baseline and ambulates without AD.  Evaluation limited today due to hypoxia with SpO2 as low as 81% on RA with poor positioning in bed and 87% on RA with repositioning (see general comments below). Pt instructed in pursed lip breathing technique with temporary improvements in SpO2 levels. RN notified of SpO2 readings.  Anticipate that once pt is medically appropriate for OOB and exertional activity, that he will progress quickly and will be able to return home with HHPT.  Pt will benefit from skilled PT to increase their independence and safety with mobility to allow discharge to the venue listed below.      Follow Up Recommendations Home health PT;Supervision for mobility/OOB    Equipment Recommendations  None recommended by PT    Recommendations for Other Services OT consult;Other (comment)(Dietitian)     Precautions / Restrictions Precautions Precautions: Fall;Other (comment) Precaution Comments: peritoneal dialysis pt Restrictions Weight Bearing Restrictions: No      Mobility  Bed Mobility Overal bed mobility: Needs Assistance Bed Mobility: Supine to Sit     Supine to sit: Min guard;HOB elevated     General bed mobility comments: On first supine>sit technique pt pulling heavily on bed rail and loses L hand grip with resultant extreme abdominal pain and returns to supine.  Transfers                 General transfer  comment: Deferred as SpO2 as low as 82% on RA lying in bed.  Remains at or above 86% on RA with repositioning in bed.   Ambulation/Gait                Stairs            Wheelchair Mobility    Modified Rankin (Stroke Patients Only)       Balance                                             Pertinent Vitals/Pain Pain Assessment: Faces Faces Pain Scale: Hurts worst Pain Location: abdomen (with loss of grip with bed mobility) Pain Descriptors / Indicators: Sharp;Moaning Pain Intervention(s): Limited activity within patient's tolerance;Monitored during session;Repositioned;Utilized relaxation techniques    Home Living Family/patient expects to be discharged to:: Private residence Living Arrangements: Parent(both parents) Available Help at Discharge: Family;Available 24 hours/day Type of Home: House Home Access: Stairs to enter Entrance Stairs-Rails: Left Entrance Stairs-Number of Steps: 2 Home Layout: One level Home Equipment: Cane - single point;Walker - 2 wheels      Prior Function Level of Independence: Needs assistance   Gait / Transfers Assistance Needed: Pt reports needing assist to stand up from parents at baseline but was ambulating without assist and without AD.  Pt reports several falls in the past 6 months (one of which occurred when donning pants in standing).   ADL's / Homemaking  Assistance Needed: Ind with ADLs. Was driving.         Hand Dominance        Extremity/Trunk Assessment   Upper Extremity Assessment Upper Extremity Assessment: LUE deficits/detail LUE Deficits / Details: H/o L shoulder dislocation.  AROM shoulder F limited due to discomfort (pt grimacing)    Lower Extremity Assessment Lower Extremity Assessment: (BLE strength grossly 3/5)    Cervical / Trunk Assessment Cervical / Trunk Assessment: Other exceptions Cervical / Trunk Exceptions: peritoneal dialysis site on abdomen  Communication    Communication: No difficulties  Cognition Arousal/Alertness: Lethargic;Awake/alert(Falls asleep when not spoken to in supine) Behavior During Therapy: [facility] for tasks assessed/performed Overall Cognitive Status: Within Functional Limits for tasks assessed                                        General Comments General comments (skin integrity, edema, etc.): Pt closing his eyes and says he is sleepy when not talking to therapist.  Says he slept well last night and ate breakfast this morning but he has been sleepy when lying down but awake when moving. Vitals monitored during session.  Supine at rest at start of session: SpO2 87-92% on RA, pulse 81, BP 148/66.  SpO2 ranging from 81-92% in supine with pt in poor position in bed.  Pt repositioned in bed and SpO2 still ranging 87-92% on RA.  Pt reports he has lost his appetite recently. This PT suggested talking to MD about referral to dietitian to address this.      Exercises     Assessment/Plan    PT Assessment Patient needs continued PT services  PT Problem List Decreased strength;Decreased activity tolerance;Decreased range of motion;Decreased balance;Decreased mobility;Decreased knowledge of use of DME;Decreased safety awareness;Pain;Cardiopulmonary status limiting activity       PT Treatment Interventions DME instruction;Gait training;Functional mobility training;Therapeutic activities;Therapeutic exercise;Balance training;Neuromuscular re-education;Patient/family education;Wheelchair mobility training    PT Goals (Current goals can be found in the Care Plan section)  Acute Rehab PT Goals Patient Stated Goal: to feel better and go home PT Goal Formulation: With patient Time For Goal Achievement: 05/17/18 Potential to Achieve Goals: Good    Frequency Min 2X/week   Barriers to discharge        Co-evaluation               AM-PAC PT "6 Clicks" Daily Activity  Outcome Measure Difficulty turning over in bed  (including adjusting bedclothes, sheets and blankets)?: Unable Difficulty moving from lying on back to sitting on the side of the bed? : Unable Difficulty sitting down on and standing up from a chair with arms (e.g., wheelchair, bedside commode, etc,.)?: Unable Help needed moving to and from a bed to chair (including a wheelchair)?: A Lot Help needed walking in hospital room?: A Lot Help needed climbing 3-5 steps with a railing? : A Lot 6 Click Score: 9    End of Session   Activity Tolerance: Treatment limited secondary to medical complications (Comment)(hypoxia) Patient left: in bed;with call bell/phone within reach;with bed alarm set;with family/visitor present Nurse Communication: Mobility status;Other (comment)(SpO2) PT Visit Diagnosis: Unsteadiness on feet (R26.81);Other abnormalities of gait and mobility (R26.89);History of falling (Z91.81);Pain;Muscle weakness (generalized) (M62.81) Pain - Right/Left: (abdomen) Pain - part of body: (abdomen)    Time: 0960-4540 PT Time Calculation (min) (ACUTE ONLY): 35 min   Charges:   PT Evaluation $PT Eval Moderate  Complexity: 1 Mod PT Treatments $Therapeutic Activity: 8-22 mins       Session was performed by student PT, Belva Crome, and directed, overseen, and documented by this PT.  Collie Siad PT, DPT 05/03/2018, 1:11 PM

## 2018-05-03 NOTE — Progress Notes (Signed)
Per MD okay for RN to order PT consult.  

## 2018-05-03 NOTE — Progress Notes (Signed)
[facility] [place] NAME: Jake Gomez    MR#:  [id number]  DATE OF BIRTH:  15-Jan-1971  SUBJECTIVE:  CHIEF COMPLAINT: Patient is reporting right lower quadrant abdominal pain 4 out of 10 during my examination.  Denies any nausea vomiting.  Had good bowel movement.  And was feeling weak and almost fell yesterday night as reported by the nurses  REVIEW OF SYSTEMS:  CONSTITUTIONAL: No fever, fatigue or weakness.  EYES: No blurred or double vision.  EARS, NOSE, AND THROAT: No tinnitus or ear pain.  RESPIRATORY: No cough, shortness of breath, wheezing or hemoptysis.  CARDIOVASCULAR: No chest pain, orthopnea, edema.  GASTROINTESTINAL: No nausea, vomiting, diarrhea.  Reporting 6 out of 10 abdominal pain.  GENITOURINARY: No dysuria, hematuria.  ENDOCRINE: No polyuria, nocturia,  HEMATOLOGY: No anemia, easy bruising or bleeding SKIN: No rash or lesion. MUSCULOSKELETAL: No joint pain or arthritis.   NEUROLOGIC: No tingling, numbness, weakness.  PSYCHIATRY: No anxiety or depression.   DRUG ALLERGIES:  No Known Allergies  VITALS:  Blood pressure 121/64, pulse 80, temperature (!) 97.4 F (36.3 C), temperature source Oral, resp. rate 16, height 5\' 9"  (1.753 m), weight 116.9 kg, SpO2 93 %.  PHYSICAL EXAMINATION:  GENERAL:  47 y.o.-year-old patient lying in the bed with no acute distress.  EYES: Pupils equal, round, reactive to light and accommodation. No scleral icterus. Extraocular muscles intact.  HEENT: Head atraumatic, normocephalic. Oropharynx and nasopharynx clear.  NECK:  Supple, no jugular venous distention. No thyroid enlargement, no tenderness.  LUNGS: Normal breath sounds bilaterally, no wheezing, rales,rhonchi or crepitation. No use of accessory muscles of respiration.  CARDIOVASCULAR: S1, S2 normal. No murmurs, rubs, or gallops.  ABDOMEN: Soft, right lower quadrant tenderness is present no rebound tenderness, nondistended. Bowel  sounds present.  PD catheter intact EXTREMITIES: No pedal edema, cyanosis, or clubbing.  NEUROLOGIC: Cranial nerves II through XII are intact. Muscle strength 5/5 in all extremities. Sensation intact. Gait not checked.  PSYCHIATRIC: The patient is alert and oriented x 3.  SKIN: No obvious rash, lesion, or ulcer.    LABORATORY PANEL:   CBC Recent Labs  Lab 05/02/18 0007  WBC 12.2*  HGB 8.2*  HCT 25.8*  PLT 229   ------------------------------------------------------------------------------------------------------------------  Chemistries  Recent Labs  Lab 05/02/18 0007  NA 130*  K 3.9  CL 86*  CO2 28  GLUCOSE 163*  BUN 57*  CREATININE 10.75*  CALCIUM 9.3  AST 43*  ALT 40  ALKPHOS 281*  BILITOT 0.9   ------------------------------------------------------------------------------------------------------------------  Cardiac Enzymes No results for input(s): TROPONINI in the last 168 hours. ------------------------------------------------------------------------------------------------------------------  RADIOLOGY:  No results found.  EKG:   Orders placed or performed during the hospital encounter of 06/10/17  . EKG 12-Lead  . EKG 12-Lead  . EKG 12-Lead  . EKG 12-Lead  . ED EKG within 10 minutes  . ED EKG within 10 minutes  . EKG    ASSESSMENT AND PLAN:   This is a 47 year old male admitted for abdominal pain. 1.  Acute Abdominal pain: Intractable; with recent history of incomplete treatment for SBP  IV morphine as needed for severe pain. [facility] is recommending to continue ceftazidime and levofloxacin as per my discussion with nephrology  Nephrology might consider switching him to hemodialysis from peritoneal dialysis if clinically clinically pain is not improving we will follow-up with nephrology Follow-up with infectious disease 2.  ESRD: On peritoneal dialysis.  Plan as above.  PhosLo with meals 3.  Hypertension: Reasonably controlled; continue amlodipine,  irbesartan and carvedilol.  Labetalol as needed 4.  Diabetes mellitus type 2: Sliding scale insulin while hospitalized. 5.  Constipation-continue Colace and MiraLAX.  Dulcolax as needed 6.  Right groin cellulitis on doxycycline  Generalized weakness PT assessment.   DVT prophylaxis: Heparin     All the records are reviewed and case discussed with Care Management/Social Workerr. Management plans discussed with the patient, family and they are in agreement.  CODE STATUS: fc   TOTAL TIME TAKING CARE OF THIS PATIENT: 35  minutes.   POSSIBLE D/C IN 1-2  DAYS, DEPENDING ON CLINICAL CONDITION.  Note: This dictation was prepared with Dragon dictation along with smaller phrase technology. Any transcriptional errors that result from this process are unintentional.   Nicholes Mango M.D on 05/03/2018 at 2:24 PM  Between 7am to 6pm - Pager - [phone] After 6pm go to [email or web address] - password [email or web address] [facility]  Office  [phone]  CC: Primary care physician; Juline Patch, MD

## 2018-05-03 NOTE — Progress Notes (Signed)
OT Cancellation Note  Patient Details Name: Jake Gomez MRN: [id number] DOB: Dec 27, 1970   Cancelled Treatment:    Reason Eval/Treat Not Completed: Other (comment). Order received, chart reviewed. Per PT, requesting hold OT evaluation this date 2/2 low SpO2 at rest. Will re-attempt at later date/time as pt is medically appropriate.  Jeni Salles, MPH, MS, OTR/L ascom [phone] 05/03/18, 1:17 PM

## 2018-05-03 NOTE — Progress Notes (Signed)
[facility]  ROUNDING NOTE   Subjective:   Peritoneal dialysis overnight. Tolerated treatment well. Reports his abdominal pain has improved. 4/10  Large bowel movement last night.   Cell count 2415 on 11/19.   Objective:  Vital signs in last 24 hours:  Temp:  [97.4 F (36.3 C)-99.3 F (37.4 C)] 97.4 F (36.3 C) (11/20 1356) Pulse Rate:  [75-88] 80 (11/20 1356) Resp:  [16] 16 (11/20 0431) BP: (100-129)/(57-69) 121/64 (11/20 1356) SpO2:  [93 %-97 %] 93 % (11/20 1356) Weight:  [116.9 kg] 116.9 kg (11/20 0500)  Weight change: 8.074 kg Filed Weights   05/02/18 0002 05/02/18 0320 05/03/18 0500  Weight: 108.9 kg 113.9 kg 116.9 kg    Intake/Output: I/O last 3 completed shifts: In: 15262.7 [P.O.:360; Other:14500; IV Piggyback:402.7] Out: 0    Intake/Output this shift:  Total I/O In: 600 [P.O.:600] Out: 593 [Other:593]  Physical Exam: General: NAD,   Head: Normocephalic, atraumatic. Moist oral mucosal membranes  Eyes: Anicteric, PERRL  Neck: Supple, trachea midline  Lungs:  Clear to auscultation  Heart: Regular rate and rhythm  Abdomen:  Tender right lower quadrant  Extremities: no peripheral edema.  Neurologic: Nonfocal, moving all four extremities  Skin: No lesions  Access: PD catheter    Basic Metabolic Panel: Recent Labs  Lab 05/02/18 0007  NA 130*  K 3.9  CL 86*  CO2 28  GLUCOSE 163*  BUN 57*  CREATININE 10.75*  CALCIUM 9.3    Liver Function Tests: Recent Labs  Lab 05/02/18 0007  AST 43*  ALT 40  ALKPHOS 281*  BILITOT 0.9  PROT 7.2  ALBUMIN 2.6*   Recent Labs  Lab 05/02/18 0007  LIPASE 28   No results for input(s): AMMONIA in the last 168 hours.  CBC: Recent Labs  Lab 05/02/18 0007  WBC 12.2*  NEUTROABS 10.1*  HGB 8.2*  HCT 25.8*  MCV 102.8*  PLT 229    Cardiac Enzymes: No results for input(s): CKTOTAL, CKMB, CKMBINDEX, TROPONINI in the last 168 hours.  BNP: Invalid input(s): POCBNP  CBG: Recent Labs   Lab 05/02/18 1122 05/02/18 1625 05/02/18 2101 05/03/18 0801 05/03/18 1143  GLUCAP 77 107* 157* 151* 128*    Microbiology: Results for orders placed or performed during the hospital encounter of 05/01/18  MRSA PCR Screening     Status: None   Collection Time: 05/02/18  8:21 AM  Result Value Ref Range Status   MRSA by PCR NEGATIVE NEGATIVE Final    Comment:        The GeneXpert MRSA Assay (FDA approved for NASAL specimens only), is one component of a comprehensive MRSA colonization surveillance program. It is not intended to diagnose MRSA infection nor to guide or monitor treatment for MRSA infections. Performed at [facility], [address] [place], [place] [address]   Body fluid culture     Status: None (Preliminary result)   Collection Time: 05/02/18  4:09 PM  Result Value Ref Range Status   Specimen Description   Final    PLEURAL Performed at [facility], [address], [place], [place] [address]    Special Requests   Final    NONE Performed at [facility], [address] [place], [place] [address]    Gram Stain   Final    FEW WBC PRESENT,BOTH PMN AND MONONUCLEAR NO ORGANISMS SEEN    Culture   Final    NO GROWTH < 12 HOURS Performed at [facility],  [address], [place], [place] [address]    Report Status PENDING  Incomplete    Coagulation Studies: No results for input(s): LABPROT, INR in the last 72 hours.  Urinalysis: No results for input(s): COLORURINE, LABSPEC, PHURINE, GLUCOSEU, HGBUR, BILIRUBINUR, KETONESUR, PROTEINUR, UROBILINOGEN, NITRITE, LEUKOCYTESUR in the last 72 hours.  Invalid input(s): APPERANCEUR    Imaging: No results found.   Medications:   . dianeal solution for CAPD/CCPD with additives    . dialysis solution 2.5% low-MG/low-CA     . amLODipine  10 mg Oral BID  . calcium acetate  2,001 mg Oral TID WC  . carvedilol  6.25 mg Oral Daily  . docusate sodium  100 mg Oral BID  .  doxycycline  100 mg Oral Q12H  . fluticasone  2 spray Each Nare Daily  . gentamicin cream  1 application Topical Daily  . heparin  5,000 Units Subcutaneous Q8H  . insulin aspart  0-5 Units Subcutaneous QHS  . insulin aspart  0-9 Units Subcutaneous TID WC  . irbesartan  300 mg Oral QHS  . [START ON 05/04/2018] levofloxacin  500 mg Oral Q48H  . polyethylene glycol  17 g Oral Daily   acetaminophen **OR** acetaminophen, bisacodyl, labetalol, morphine injection, ondansetron **OR** ondansetron (ZOFRAN) IV, traMADol  Assessment/ Plan:  [name] is a 47 y.o. Hispanic male with end stage renal disease on peritoneal dialysis, hypertension, insulin dependent diabetes mellitus type II, diabetic neuropathy, diabetic retinopathy, hyperlipidemia  [facility] PD EDW 111kg.  CCPD 10 hours 3 liter fills 4 exchanges with last fill 2.5 liters.   1. End Stage Renal Disease: on peritoneal dialysis Peritoneal dialysis scheduled for tonight. Orders prepared. 2.5% dextrose.   2. Peritonitis: pseudomonas aeruginosa on 11/13 at [facility]. Cell count of 9825 with toxic granulocytes Discharged on ceftaxidime 1.5g IP daily and levofloxacin 500mg  every other day.  Currently with leukocytosis Discussed case with Dr. Delaine Lame, Infectious disease. Agrees with plan.   3. Hypertension: blood pressure at goal.  - amlodipine, carvedilol, irbestartan, labetalol  4. Anemia of chronic kidney disease:   - EPO as outpatient. Last dose was 11/18  5. Secondary Hyperparathyroidism with hyperphosphatemia. Outpatient PTH was low at 97 on 11/5. Not currently on vitamin D agent.  11/5: phosphorus 8.9 - continue calcium acetate.    LOS: 0 Jake Gomez 11/20/20193:54 PM

## 2018-05-03 NOTE — Progress Notes (Signed)
Per MD okay for RN add tramadol 50 mg Q6 as needed.

## 2018-05-04 LAB — RENAL FUNCTION PANEL
Albumin: 2.2 g/dL — ABNORMAL LOW (ref 3.5–5.0)
Anion gap: 12 (ref 5–15)
BUN: 54 mg/dL — AB (ref 6–20)
CHLORIDE: 87 mmol/L — AB (ref 98–111)
CO2: 29 mmol/L (ref 22–32)
Calcium: 9.4 mg/dL (ref 8.9–10.3)
Creatinine, Ser: 11.19 mg/dL — ABNORMAL HIGH (ref 0.61–1.24)
GFR calc Af Amer: 6 mL/min — ABNORMAL LOW (ref >60)
GFR, EST NON AFRICAN AMERICAN: 5 mL/min — AB (ref >60)
GLUCOSE: 247 mg/dL — AB (ref 70–99)
Phosphorus: 6.2 mg/dL — ABNORMAL HIGH (ref 2.5–4.6)
Potassium: 3.8 mmol/L (ref 3.5–5.1)
Sodium: 128 mmol/L — ABNORMAL LOW (ref 135–145)

## 2018-05-04 LAB — CBC
HCT: 23.5 % — ABNORMAL LOW (ref 39.0–52.0)
Hemoglobin: 7.5 g/dL — ABNORMAL LOW (ref 13.0–17.0)
MCH: 33.3 pg (ref 26.0–34.0)
MCHC: 31.9 g/dL (ref 30.0–36.0)
MCV: 104.4 fL — AB (ref 80.0–100.0)
NRBC: 0.2 % (ref 0.0–0.2)
Platelets: 260 10*3/uL (ref 150–400)
RBC: 2.25 MIL/uL — ABNORMAL LOW (ref 4.22–5.81)
RDW: 16.9 % — AB (ref 11.5–15.5)
WBC: 12.4 10*3/uL — AB (ref 4.0–10.5)

## 2018-05-04 LAB — GLUCOSE, CAPILLARY
Glucose-Capillary: 236 mg/dL — ABNORMAL HIGH (ref 70–99)
Glucose-Capillary: 119 mg/dL — ABNORMAL HIGH (ref 70–99)
Glucose-Capillary: 135 mg/dL — ABNORMAL HIGH (ref 70–99)
Glucose-Capillary: 133 mg/dL — ABNORMAL HIGH (ref 70–99)

## 2018-05-04 LAB — VITAMIN B12: VITAMIN B 12: 532 pg/mL (ref 180–914)

## 2018-05-04 MED ORDER — HEPARIN SODIUM (PORCINE) 1000 UNIT/ML DIALYSIS
1000.0000 [IU] | INTRAMUSCULAR | Status: DC | PRN
  Filled 2018-05-04: qty 6

## 2018-05-04 MED ORDER — VITAMIN B-12 1000 MCG PO TABS
1000.0000 ug | ORAL_TABLET | Freq: Every day | ORAL | Status: DC
  Administered 2018-05-04 – 2018-05-06 (×3): 1000 ug via ORAL
  Filled 2018-05-04 (×3): qty 1

## 2018-05-04 MED ORDER — VITAMIN B-1 100 MG PO TABS
100.0000 mg | ORAL_TABLET | Freq: Every day | ORAL | Status: DC
  Administered 2018-05-04 – 2018-05-06 (×3): 100 mg via ORAL
  Filled 2018-05-04 (×3): qty 1

## 2018-05-04 MED ORDER — FOLIC ACID 1 MG PO TABS
1.0000 mg | ORAL_TABLET | Freq: Every day | ORAL | Status: DC
  Administered 2018-05-04 – 2018-05-06 (×3): 1 mg via ORAL
  Filled 2018-05-04 (×3): qty 1

## 2018-05-04 MED ORDER — HEPARIN 1000 UNIT/ML FOR PERITONEAL DIALYSIS
INTRAPERITONEAL | Status: DC | PRN
  Filled 2018-05-04 (×5): qty 6000

## 2018-05-04 NOTE — Progress Notes (Signed)
PT Cancellation Note  Patient Details Name: Jake Gomez MRN: [id number] DOB: Oct 10, 1970   Cancelled Treatment:    Reason Eval/Treat Not Completed: Medical issues which prohibited therapy.  Upon PT arrival pt reports 10/10 abdominal pain.  Pt's vitals checked with SpO2 fluctuating from 86-88% on RA, BP 139/68, pulse in the 70s. Pt in poor position in bed and assisted in scooting to [facility] with HOB then elevated and cues for pursed lip breathing. SpO2 fluctuating from 79-87%. RN called regarding SpO2 levels who reported to room and placed pt on 2L O2 via [place] with SpO2 improving to 90-91%.    Collie Siad PT, DPT 05/04/2018, 3:53 PM

## 2018-05-04 NOTE — Progress Notes (Signed)
Pt stated that his PD machine alarmed all throughout the night. When I arrived at 0830 to disconnect pt, he was still on his last drain. Will come back later when PD tx is complete,MD aware.   05/04/18 0839  Procedure Comments  Peritoneal Dialysis Comments pt had a lot of problems last night, machine alarmed multiple times. Pt tx not finished when I arrived to disconnect pt , MD aware.   Hand-Off documentation  Report given to (Full Name) Elita Quick Rodriquez,RN  Report received from (Full Name) Stark Bray

## 2018-05-04 NOTE — Progress Notes (Signed)
[place] [facility] [place] NAME: Jake Gomez    MR#:  [id number]  DATE OF BIRTH:  07-Nov-1970  SUBJECTIVE:  CHIEF COMPLAINT: Patient is reporting right lower quadrant abdominal pain 4 out of 10 during PD.  Feeling much better.  REVIEW OF SYSTEMS:  CONSTITUTIONAL: No fever, fatigue or weakness.  EYES: No blurred or double vision.  EARS, NOSE, AND THROAT: No tinnitus or ear pain.  RESPIRATORY: No cough, shortness of breath, wheezing or hemoptysis.  CARDIOVASCULAR: No chest pain, orthopnea, edema.  GASTROINTESTINAL: No nausea, vomiting, diarrhea.  Reporting 6 out of 10 abdominal pain.  GENITOURINARY: No dysuria, hematuria.  ENDOCRINE: No polyuria, nocturia,  HEMATOLOGY: No anemia, easy bruising or bleeding SKIN: No rash or lesion. MUSCULOSKELETAL: No joint pain or arthritis.   NEUROLOGIC: No tingling, numbness, weakness.  PSYCHIATRY: No anxiety or depression.   DRUG ALLERGIES:  No Known Allergies  VITALS:  Blood pressure 121/66, pulse 72, temperature 98.2 F (36.8 C), temperature source Oral, resp. rate 18, height 5\' 9"  (1.753 m), weight 117 kg, SpO2 94 %.  PHYSICAL EXAMINATION:  GENERAL:  47 y.o.-year-old patient lying in the bed with no acute distress.  EYES: Pupils equal, round, reactive to light and accommodation. No scleral icterus. Extraocular muscles intact.  HEENT: Head atraumatic, normocephalic. Oropharynx and nasopharynx clear.  NECK:  Supple, no jugular venous distention. No thyroid enlargement, no tenderness.  LUNGS: Normal breath sounds bilaterally, no wheezing, rales,rhonchi or crepitation. No use of accessory muscles of respiration.  CARDIOVASCULAR: S1, S2 normal. No murmurs, rubs, or gallops.  ABDOMEN: Soft, right lower quadrant tenderness is present no rebound tenderness, nondistended. Bowel sounds present.  PD catheter intact.  Right groin has a dry scab but no surrounding redness or tenderness. EXTREMITIES: No pedal  edema, cyanosis, or clubbing.  NEUROLOGIC: Cranial nerves II through XII are intact. Muscle strength 4/5 in all extremities. Sensation intact. Gait not checked.  PSYCHIATRIC: The patient is alert and oriented x 3.  SKIN: No obvious rash, lesion, or ulcer.    LABORATORY PANEL:   CBC Recent Labs  Lab 05/04/18 0345  WBC 12.4*  HGB 7.5*  HCT 23.5*  PLT 260   ------------------------------------------------------------------------------------------------------------------  Chemistries  Recent Labs  Lab 05/02/18 0007 05/04/18 0345  NA 130* 128*  K 3.9 3.8  CL 86* 87*  CO2 28 29  GLUCOSE 163* 247*  BUN 57* 54*  CREATININE 10.75* 11.19*  CALCIUM 9.3 9.4  AST 43*  --   ALT 40  --   ALKPHOS 281*  --   BILITOT 0.9  --    ------------------------------------------------------------------------------------------------------------------  Cardiac Enzymes No results for input(s): TROPONINI in the last 168 hours. ------------------------------------------------------------------------------------------------------------------  RADIOLOGY:  No results found.  EKG:   Orders placed or performed during the hospital encounter of 06/10/17  . EKG 12-Lead  . EKG 12-Lead  . EKG 12-Lead  . EKG 12-Lead  . ED EKG within 10 minutes  . ED EKG within 10 minutes  . EKG    ASSESSMENT AND PLAN:   This is a 47 year old male admitted for abdominal pain. 1.  Acute Abdominal pain: Intractable; with recent history of incomplete treatment for SBP  IV morphine as needed for severe pain. [facility] is recommending to continue ceftazidime and levofloxacin as per my discussion with nephrology  ID has suggested to follow the recommendations from [facility] infectious disease doctor and no changes. 2.  ESRD: On peritoneal dialysis.  Plan as above.  PhosLo with meals  Initially there was a consideration to change to hemodialysis but as patient is improving now and less abdominal pain, continuing peritoneal  dialysis 3.  Hypertension: Reasonably controlled; continue amlodipine, irbesartan and carvedilol.  Labetalol as needed. 4.  Diabetes mellitus type 2: Sliding scale insulin while hospitalized. 5.  Constipation-continue Colace and MiraLAX.  Dulcolax as needed. 6.  Right groin cellulitis-this was treated at [facility] and was discharged on doxycycline, now the groin is clear and does not seem to be having any infection so we will stop doxycycline.  Generalized weakness PT assessment.   DVT prophylaxis: Heparin   All the records are reviewed and case discussed with Care Management/Social Workerr. Management plans discussed with the patient, family and they are in agreement.  CODE STATUS: fc   TOTAL TIME TAKING CARE OF THIS PATIENT: 35  minutes.   POSSIBLE D/C IN 1-2  DAYS, DEPENDING ON CLINICAL CONDITION.  Note: This dictation was prepared with Dragon dictation along with smaller phrase technology. Any transcriptional errors that result from this process are unintentional.   Vaughan Basta M.D on 05/04/2018 at 1:52 PM  Between 7am to 6pm - Pager - [phone] After 6pm go to [email or web address] - password [email or web address] [facility]  Office  [phone]  CC: Primary care physician; Juline Patch, MD

## 2018-05-04 NOTE — Progress Notes (Signed)
[facility]  ROUNDING NOTE   Subjective:   Peritoneal dialysis overnight. Did not tolerate treatment well. Reports his abdominal pain with drain pain.   UF total of 2052mL.   Objective:  Vital signs in last 24 hours:  Temp:  [97.4 F (36.3 C)-99.3 F (37.4 C)] 98 F (36.7 C) (11/21 0305) Pulse Rate:  [79-90] 81 (11/21 0753) Resp:  [18-22] 22 (11/21 0305) BP: (121-148)/(51-74) 148/74 (11/21 0753) SpO2:  [90 %-95 %] 95 % (11/21 0305) Weight:  [841 kg] 117 kg (11/20 2038)  Weight change: 0.063 kg Filed Weights   05/02/18 0320 05/03/18 0500 05/03/18 2038  Weight: 113.9 kg 116.9 kg 117 kg    Intake/Output: I/O last 3 completed shifts: In: 39 [P.O.:960] Out: 593 [Other:593]   Intake/Output this shift:  Total I/O In: 240 [P.O.:240] Out: -   Physical Exam: General: NAD,   Head: Normocephalic, atraumatic. Moist oral mucosal membranes  Eyes: Anicteric, PERRL  Neck: Supple, trachea midline  Lungs:  Clear to auscultation  Heart: Regular rate and rhythm  Abdomen:  Tender right lower quadrant  Extremities: no peripheral edema.  Neurologic: Nonfocal, moving all four extremities  Skin: No lesions  Access: PD catheter    Basic Metabolic Panel: Recent Labs  Lab 05/02/18 0007 05/04/18 0345  NA 130* 128*  K 3.9 3.8  CL 86* 87*  CO2 28 29  GLUCOSE 163* 247*  BUN 57* 54*  CREATININE 10.75* 11.19*  CALCIUM 9.3 9.4  PHOS  --  6.2*    Liver Function Tests: Recent Labs  Lab 05/02/18 0007 05/04/18 0345  AST 43*  --   ALT 40  --   ALKPHOS 281*  --   BILITOT 0.9  --   PROT 7.2  --   ALBUMIN 2.6* 2.2*   Recent Labs  Lab 05/02/18 0007  LIPASE 28   No results for input(s): AMMONIA in the last 168 hours.  CBC: Recent Labs  Lab 05/02/18 0007 05/04/18 0345  WBC 12.2* 12.4*  NEUTROABS 10.1*  --   HGB 8.2* 7.5*  HCT 25.8* 23.5*  MCV 102.8* 104.4*  PLT 229 260    Cardiac Enzymes: No results for input(s): CKTOTAL, CKMB, CKMBINDEX, TROPONINI  in the last 168 hours.  BNP: Invalid input(s): POCBNP  CBG: Recent Labs  Lab 05/03/18 1143 05/03/18 1634 05/03/18 2147 05/04/18 0728 05/04/18 1131  GLUCAP 128* 108* 112* 57* 119*    Microbiology: Results for orders placed or performed during the hospital encounter of 05/01/18  MRSA PCR Screening     Status: None   Collection Time: 05/02/18  8:21 AM  Result Value Ref Range Status   MRSA by PCR NEGATIVE NEGATIVE Final    Comment:        The GeneXpert MRSA Assay (FDA approved for NASAL specimens only), is one component of a comprehensive MRSA colonization surveillance program. It is not intended to diagnose MRSA infection nor to guide or monitor treatment for MRSA infections. Performed at [facility], [address] [place], [place] [address]   Body fluid culture     Status: None (Preliminary result)   Collection Time: 05/02/18  4:09 PM  Result Value Ref Range Status   Specimen Description   Final    PLEURAL Performed at [facility], [address] [place], [place] [address]    Special Requests   Final    NONE Performed at [facility], [address] [place], [place] [address]    Gram Stain  Final    FEW WBC PRESENT,BOTH PMN AND MONONUCLEAR NO ORGANISMS SEEN    Culture   Final    NO GROWTH 2 DAYS Performed at [facility], [address] [place], [place] [address]    Report Status PENDING  Incomplete    Coagulation Studies: No results for input(s): LABPROT, INR in the last 72 hours.  Urinalysis: No results for input(s): COLORURINE, LABSPEC, PHURINE, GLUCOSEU, HGBUR, BILIRUBINUR, KETONESUR, PROTEINUR, UROBILINOGEN, NITRITE, LEUKOCYTESUR in the last 72 hours.  Invalid input(s): APPERANCEUR    Imaging: No results found.   Medications:   . dianeal solution for CAPD/CCPD with additives 2,500 mL/hr at 05/03/18 2123  . dialysis solution 2.5% low-MG/low-CA 15,000 mL (05/03/18 2124)   . amLODipine  10 mg Oral  BID  . calcium acetate  2,001 mg Oral TID WC  . carvedilol  6.25 mg Oral Daily  . docusate sodium  100 mg Oral BID  . doxycycline  100 mg Oral Q12H  . fluticasone  2 spray Each Nare Daily  . folic acid  1 mg Oral Daily  . gentamicin cream  1 application Topical Daily  . heparin  5,000 Units Subcutaneous Q8H  . insulin aspart  0-5 Units Subcutaneous QHS  . insulin aspart  0-9 Units Subcutaneous TID WC  . irbesartan  300 mg Oral QHS  . levofloxacin  500 mg Oral Q48H  . polyethylene glycol  17 g Oral Daily  . thiamine  100 mg Oral Daily  . vitamin B-12  1,000 mcg Oral Daily   acetaminophen **OR** acetaminophen, bisacodyl, HYDROmorphone (DILAUDID) injection, labetalol, ondansetron **OR** ondansetron (ZOFRAN) IV, traMADol  Assessment/ Plan:  [name] is a 47 y.o. Hispanic male with end stage renal disease on peritoneal dialysis, hypertension, insulin dependent diabetes mellitus type II, diabetic neuropathy, diabetic retinopathy, hyperlipidemia  [facility] PD EDW 111kg.  CCPD 10 hours 3 liter fills 4 exchanges with last fill 2.5 liters.   1. End Stage Renal Disease: on peritoneal dialysis Peritoneal dialysis scheduled for tonight. Orders prepared. 2.5% dextrose.   2. Peritonitis: pseudomonas aeruginosa on 11/13 at [facility]. Cell count of 9825 with toxic granulocytes Discharged on ceftaxidime 1.5g IP daily and levofloxacin 500mg  every other day.  Discussed case with Dr. Delaine Lame, Infectious disease on 11/20. Agrees with plan.   3. Hypertension: blood pressure at goal.  - amlodipine, carvedilol, irbestartan, labetalol  4. Anemia of chronic kidney disease:  Hemoglobin 7.5 - EPO as outpatient. Last dose was 11/18  5. Secondary Hyperparathyroidism with hyperphosphatemia. Outpatient PTH was low at 97 on 11/5. Not currently on vitamin D agent.  11/5: phosphorus 8.9 - continue calcium acetate.    LOS: 1 Liyat Faulkenberry 11/21/201912:12 PM

## 2018-05-04 NOTE — Progress Notes (Signed)
Post PD assessment. Pt unable to finish PD tx, tx ended per MD orders. Last drain volume 846, I-drain 1784, UF -2092, pt stated that he is in a lot of pain r/t the machine attempting to drain, MD aware.    05/04/18 1336  Completion  Effluent Appearance Clear;Yellow;Fibrin  Treatment Status  (tx unable to finish,MD aware, tx ended)  Procedure Comments  Peritoneal Dialysis Comments  (tx ended per MD orders, unable to finish PD tx)  Education / Care Plan  Dialysis Education Provided Yes  Documented Education in Care Plan Yes  Hand-Off documentation  Report given to (Full Name) Elita Quick Rodriquez,RN  Report received from (Full Name) Stark Bray

## 2018-05-04 NOTE — Evaluation (Signed)
Occupational Therapy Evaluation Patient Details Name: Jake Gomez MRN: [id number] DOB: 1971-04-02 Today's Date: 05/04/2018    History of Present Illness Pt is a 47 y/o M who presented with abdominal pain.  Pt recently d/c from [place] 4 days prior for treatment of bacterial peritonitis.  Pt's PMH includes peritoneal dialysis.     Clinical Impression   Pt is 47 year old male who presents with abdominal pain and recently DC's from [place] for bacterial peritonitis and is receiving peritoneal dialysis.  His pain level is 8/10 and was lethargic from pain medications and with cues was able to participate but not safe for OOB or EOB activities for LB dressing.  Pt had decreased O2 sats with PT which will need to be monitored during treatment sessions.  Stable for this session but pt did not get OOB.  Provided demonstration to him and his 2 male cousins for LB dressing using reacher and sock aid.  Pt has limited AROM in LUE as noted in previous part of eval.  Rec continued OT while in hospital to continue to work on increasing independence in ADLs, balance and functional mobility training, energy conservation and breathing tech for pain managment and family ed and training and OT [facility] after discharge.    Follow Up Recommendations  Home health OT    Equipment Recommendations  3 in 1 bedside commode;Tub/shower bench    Recommendations for Other Services       Precautions / Restrictions Precautions Precautions: Fall;Other (comment) Precaution Comments: peritoneal dialysis pt Restrictions Weight Bearing Restrictions: No      Mobility Bed Mobility                  Transfers                      Balance                                           ADL either performed or assessed with clinical judgement   ADL Overall ADL's : Needs assistance/impaired Eating/Feeding: Independent;Set up   Grooming: Wash/dry hands;Wash/dry face;Oral care;Brushing  hair;Set up;Applying deodorant;Independent Grooming Details (indicate cue type and reason): takes extra time due to pain and lethargy Upper Body Bathing: Set up;Minimal assistance   Lower Body Bathing: Maximal assistance;Set up   Upper Body Dressing : Set up;Minimal assistance   Lower Body Dressing: Moderate assistance;Set up Lower Body Dressing Details (indicate cue type and reason): performed at bed level only due to pain and difficulty staying awake for session due to pain meds and pain               General ADL Comments: NO OOB activities performed due to [place] and difficulty staying awake due to pain meds and abdominal pain.  Demonstration provided to pt and 2 cousins in room for LB dressing with AD.     Vision Patient Visual Report: No change from baseline       Perception     Praxis      Pertinent Vitals/Pain Pain Assessment: Faces Faces Pain Scale: Hurts whole lot Pain Location: abdomen Pain Descriptors / Indicators: Discomfort;Sharp Pain Intervention(s): Limited activity within patient's tolerance;Monitored during session;Repositioned;Utilized relaxation techniques;Premedicated before session     Hand Dominance Right   Extremity/Trunk Assessment Upper Extremity Assessment Upper Extremity Assessment: LUE deficits/detail LUE Deficits / Details: H/o L shoulder  dislocation.  AROM shoulder F limited due to discomfort (pt grimacing)   Lower Extremity Assessment Lower Extremity Assessment: Defer to PT evaluation       Communication Communication Communication: No difficulties   Cognition Arousal/Alertness: Lethargic;Awake/alert Behavior During Therapy: WFL for tasks assessed/performed Overall Cognitive Status: Within Functional Limits for tasks assessed                                     General Comments       Exercises     Shoulder Instructions      Home Living Family/patient expects to be discharged to:: Private  residence Living Arrangements: Parent Available Help at Discharge: Family;Available 24 hours/day Type of Home: House Home Access: Stairs to enter [employer] of Steps: 2 Entrance Stairs-Rails: Left Home Layout: One level     Bathroom Shower/Tub: Teacher, early years/pre: Standard Bathroom Accessibility: No   Home Equipment: Cane - single point;Walker - 2 wheels          Prior Functioning/Environment Level of Independence: Needs assistance  Gait / Transfers Assistance Needed: Pt reports needing assist to stand up from parents at baseline but was ambulating without assist and without AD.  Pt reports several falls in the past 6 months (one of which occurred when donning pants in standing).  ADL's / Homemaking Assistance Needed: Ind with ADLs. Was driving.             OT Problem List: Pain;Decreased activity tolerance;Decreased range of motion;Decreased strength;Decreased knowledge of use of DME or AE      OT Treatment/Interventions: Self-care/ADL training;Patient/family education;Energy conservation;DME and/or AE instruction    OT Goals(Current goals can be found in the care plan section) Acute Rehab OT Goals Patient Stated Goal: to have pain under better control so I can take care of myself OT Goal Formulation: With patient/family Time For Goal Achievement: 05/18/18 Potential to Achieve Goals: Good ADL Goals Pt Will Perform Lower Body Dressing: with set-up;with min assist;with adaptive equipment;sit to/from stand Pt Will Transfer to Toilet: with set-up;stand pivot transfer;with min assist;regular height toilet  OT Frequency: Min 2X/week   Barriers to D/C:            Co-evaluation              AM-PAC PT "6 Clicks" Daily Activity     Outcome Measure Help from another person eating meals?: None Help from another person taking care of personal grooming?: None Help from another person toileting, which includes using toliet, bedpan, or urinal?:  A Little Help from another person bathing (including washing, rinsing, drying)?: A Little Help from another person to put on and taking off regular upper body clothing?: A Little Help from another person to put on and taking off regular lower body clothing?: A Lot 6 Click Score: 19   End of Session    Activity Tolerance: Patient limited by lethargy Patient left: in bed;with call bell/phone within reach;with family/visitor present  OT Visit Diagnosis: Muscle weakness (generalized) (M62.81);History of falling (Z91.81)                Time: 0109-3235 OT Time Calculation (min): 32 min Charges:  OT General Charges $OT Visit: 1 Visit OT Evaluation $OT Eval Low Complexity: 1 Low OT Treatments $Self Care/Home Management : 8-22 mins  Chrys Racer, OTR/L ascom [phone] 05/04/18, 11:27 AM

## 2018-05-04 NOTE — Progress Notes (Signed)
PD tx start. Pre PD weight 117.5kg   05/04/18 2051  Cycler Setup  Total Number of Exchanges 5 (including last fill)  Fill Volume 3000  Dianeal Solution Dextrose 2.5% in 6000 mL  Last Bag Alarm 0  Dianeal Additive Other (Comment) (medication (antibiotic) for last fill only )  Last Fill Volume 2500  Fill Time - Minute(s) 10  Drain Time - Minute(s) 20 mins  Dianeal Additive Heparin (in unmedicated solution)  Exit Site Care Performed Yes  Completion  Treatment Status Started  Fluid Balance - CCPD  Total Intake for Exchanges (mL) 14500 ml  Education / Care Plan  Dialysis Education Provided Yes  Documented Education in Care Plan Yes  Hand-Off documentation  Report given to (Full Name) Jacqulynn Cadet, RN  Report received from (Full Name) Stark Bray

## 2018-05-04 NOTE — Care Management (Signed)
RNCM following case.  To complete assessment for home health and equipment.

## 2018-05-05 LAB — GLUCOSE, CAPILLARY
Glucose-Capillary: 207 mg/dL — ABNORMAL HIGH (ref 70–99)
Glucose-Capillary: 137 mg/dL — ABNORMAL HIGH (ref 70–99)
Glucose-Capillary: 123 mg/dL — ABNORMAL HIGH (ref 70–99)
Glucose-Capillary: 114 mg/dL — ABNORMAL HIGH (ref 70–99)
Glucose-Capillary: 130 mg/dL — ABNORMAL HIGH (ref 70–99)

## 2018-05-05 MED ORDER — FOLIC ACID 1 MG PO TABS: 1.0000 mg | ORAL_TABLET | Freq: Every day | ORAL | 0 refills | Status: AC

## 2018-05-05 MED ORDER — DOCUSATE SODIUM 100 MG PO CAPS: 100.0000 mg | ORAL_CAPSULE | Freq: Two times a day (BID) | ORAL | 0 refills | Status: DC

## 2018-05-05 MED ORDER — THIAMINE HCL 100 MG PO TABS: 100.0000 mg | ORAL_TABLET | Freq: Every day | ORAL | 0 refills | Status: DC

## 2018-05-05 MED ORDER — TRAMADOL HCL 50 MG PO TABS: 50.0000 mg | ORAL_TABLET | Freq: Three times a day (TID) | ORAL | 0 refills | Status: DC | PRN

## 2018-05-05 MED ORDER — TUBERCULIN PPD 5 UNIT/0.1ML ID SOLN
5.0000 [IU] | Freq: Once | INTRADERMAL | Status: DC
  Administered 2018-05-05: 5 [IU] via INTRADERMAL
  Filled 2018-05-05: qty 0.1

## 2018-05-05 MED ORDER — FLUTICASONE PROPIONATE 50 MCG/ACT NA SUSP: 2.0000 | Freq: Every day | NASAL | 6 refills | Status: DC

## 2018-05-05 MED ORDER — CYANOCOBALAMIN 1000 MCG PO TABS: 1000.0000 ug | ORAL_TABLET | Freq: Every day | ORAL | 0 refills | Status: DC

## 2018-05-05 MED ORDER — POLYETHYLENE GLYCOL 3350 17 G PO PACK: 17.0000 g | PACK | Freq: Every day | ORAL | 0 refills | Status: DC

## 2018-05-05 MED ORDER — DELFLEX-LC/2.5% DEXTROSE 394 MOSM/L IP SOLN: INTRAVENOUS_CENTRAL | 0 refills | Status: DC

## 2018-05-05 MED ORDER — OXYCODONE-ACETAMINOPHEN 5-325 MG PO TABS: 1.0000 | ORAL_TABLET | Freq: Four times a day (QID) | ORAL | 0 refills | Status: DC | PRN

## 2018-05-05 NOTE — Clinical Social Work Note (Signed)
Clinical Social Work Assessment  Patient Details  Name: Jake Gomez MRN: [id number] Date of Birth: 1970/11/11  Date of referral:  05/05/18               Reason for consult:  Facility Placement                Permission sought to share information with:  Case Manager, Customer service manager, Family Supports Permission granted to share information::  Yes, Verbal Permission Granted  Name::      SNF   Agency::   [facility]  Relationship::     Contact Information:     Housing/Transportation Living arrangements for the past 2 months:  [place] of Information:  Patient Patient Interpreter Needed:  None Criminal Activity/Legal Involvement Pertinent to Current Situation/Hospitalization:  No - Comment as needed Significant Relationships:  Parents, Other Family Members Lives with:  Self Do you feel safe going back to the place where you live?  Yes Need for family participation in patient care:  Yes (Comment)  Care giving concerns:  Patient lives alone and has a lot of family support.    Social Worker assessment / plan:  CSW consulted for SNF placement. CSW met with patient and his mother at bedside. Patient states that he lives alone but has a lot of family support. CSW explained PT recommendation of SNF. Patient in agreement with SNF. CSW explained that in order to go to SNF patient would have to change Peritoneal dialysis to hemo dialysis. Patient states that he does not want to change to HD and would prefer to keep doing PD. Patient states that he would like a little more time to think about it. Patient is in agreement with starting bed search and asked that CSW come back tomorrow to check on his decision. CSW will begin bed search and give offers once received. CSW made Elvera Bicker, dialysis liaison aware of above as well as RNCM. CSW will continue to follow for discharge plan.   Employment status:    Insurance information:  Medicare PT Recommendations:   [place] / Referral to community resources:  [place]  Patient/Family's Response to care:  Patient thanked CSW for assistance   Patient/Family's Understanding of and Emotional Response to Diagnosis, Current Treatment, and Prognosis:  Patient understands complications with SNF placement and would like more time to discuss with family.   Emotional Assessment Appearance:  Appears stated age Attitude/Demeanor/Rapport:    Affect (typically observed):  Pleasant, Accepting Orientation:  Oriented to Self, Oriented to Place, Oriented to  Time, Oriented to Situation Alcohol / Substance use:  Not Applicable Psych involvement (Current and /or in the community):  No (Comment)  Discharge Needs  Concerns to be addressed:  Discharge Planning Concerns Readmission within the last 30 days:  No Current discharge risk:  None Barriers to Discharge:  Continued Medical Work up   [employer], [place] 05/05/2018, 3:11 PM

## 2018-05-05 NOTE — Progress Notes (Signed)
PD completed

## 2018-05-05 NOTE — Clinical Social Work Note (Signed)
Patient's PT recommendation changed today to SNF due to needing more assistance. Patient received peritoneal dialysis and will be unable to go to SNF with that type of dialysis. Patient is for discharge today. He will return home. RNCM is aware.   [place], [place]

## 2018-05-05 NOTE — Progress Notes (Signed)
   05/05/18 1200  Clinical Encounter Type  Visited With Patient and family together  Visit Type Initial;Spiritual support  Recommendations Follow-up if needed.  Spiritual Encounters  Spiritual Needs Emotional;Prayer  Stress Factors  Patient Stress Factors Health changes   Chaplain engaged patient and parents in conversation and prayer. The patient may, or may not, require the services of a priest. Bonney Roussel explained that a visit could be arranged. Chaplain offered emotional support, pastoral presence, and prayer.

## 2018-05-05 NOTE — Progress Notes (Signed)
Pharmacy does not have PD additives ready, waiting on pharmacy to initiate PD tx    05/05/18 1855  Hand-Off documentation  Report given to (Full Name) Stark Bray  Report received from (Full Name) Corene Cornea,  Pharmacy

## 2018-05-05 NOTE — Progress Notes (Signed)
Physical Therapy Treatment Patient Details Name: Jake Gomez MRN: [id number] DOB: June 30, 1970 Today's Date: 05/05/2018    History of Present Illness Pt is a 47 y/o M who presented with abdominal pain.  Pt recently d/c from [place] 4 days prior for treatment of bacterial peritonitis.  Pt's PMH includes peritoneal dialysis.      PT Comments    Mr. Burnham continues to demonstrate hypoxia at rest and was found to have SpO2 of 85% on RA resting in bed upon PT arrival.  RN present and placed pt on 1L O2 with SpO2 improving to 94%.  Pt then remained at or above 93% on 1L O2 throughout remainder of session. HR and BP stable throughout session.  Pt's pain has improved and was able to participate in OOB activity this session.  He does require min +2 assist for bed mobility.  Pt stood from bed with min +2 assist to assist from and elevated bed height.   On second sit>stand from bed pt demonstrates unexpected spontaneous Bil LE shakiness and Bil knee buckling, requiring max +2 assist to steady and sit.  On third attempt movement was smooth and required just min assist again.  Pt reports he has been experiencing these types of episodes with mobility since onset of symptoms ~2 weeks prior. This was the first time PT has witnessed this as pt has been unable to attempt OOB activity since admission.  SPT performed neuro assessment at end of session due to this presentation and found finger to nose and heel to shin test Bil to be impaired.  Shakiness does not appear to be due to weakness as pt's strength: BLE grossly 4-/5, RUE grossly 3+/5, LUE grossly 3/5.  Pt demonstrates impaired coordination intermittently when moving UEs or LEs through AROM.  Following session this PT spoke with Dr. Molli Hazard regarding this presentation with recommendation for Neuro consult and updated follow up recommendation of SNF due to pt's high fall risk. RN also made aware of presentation and concerns.  CM and SW notified of updated  recommendation.   Follow Up Recommendations  SNF     Equipment Recommendations  None recommended by PT    Recommendations for Other Services Other (comment)(Neuro consult due to new shakiness/spontaneous instability)     Precautions / Restrictions Precautions Precautions: Fall;Other (comment) Precaution Comments: peritoneal dialysis pt Restrictions Weight Bearing Restrictions: No    Mobility  Bed Mobility Overal bed mobility: Needs Assistance Bed Mobility: Supine to Sit     Supine to sit: HOB elevated;Min assist;+2 for physical assistance     General bed mobility comments: Assist to elevate trunk with HOB elevated.    Transfers Overall transfer level: Needs assistance Equipment used: Rolling walker (2 wheeled) Transfers: Sit to/from [facility] Sit to Stand: Min assist;From elevated surface;Max assist;+2 physical assistance Stand pivot transfers: Min assist       General transfer comment: Bed elevated as pt unable to stand from regular height bed (pt reports his bed is very high off the floor at home).  Cues for proper hand placement for safety.  On second sit>stand pt demonstrates Bil LE shakiness and Bil knee buckling, requiring max +2 assist to steady and sit.  On third attempt movement was smooth and required just min assist again.  Pt pivoted to [facility] with min guard assist +2 for safety.   Ambulation/Gait Ambulation/Gait assistance: Min assist Gait Distance (Feet): 30 Feet Assistive device: Rolling walker (2 wheeled) Gait Pattern/deviations: Step-through pattern;Decreased step length - right;Decreased step  length - left Gait velocity: decreased Gait velocity interpretation: <1.31 ft/sec, indicative of household ambulator General Gait Details: BLE knee instability but no knee buckle or LOB.  Min assist to remain stready.  Pt moves slowly and cautiously and pushes heavily through RW with UEs.  Chair follow for safety.    Stairs              Wheelchair Mobility    Modified Rankin (Stroke Patients Only)       Balance Overall balance assessment: Needs assistance Sitting-balance support: No upper extremity supported;Feet supported Sitting balance-Leahy Scale: Good     Standing balance support: Bilateral upper extremity supported;During functional activity Standing balance-Leahy Scale: Poor Standing balance comment: Relies on UE support and demonstrates significant instability BLE on second attempt for sit>stand.                             Cognition Arousal/Alertness: Lethargic;Awake/alert(Closes eyes in supine when not talking or spoken to) Behavior During Therapy: [facility] for tasks assessed/performed Overall Cognitive Status: Within Functional Limits for tasks assessed                                        Exercises General Exercises - Lower Extremity Heel Slides: AROM;Both;10 reps;Supine    General Comments General comments (skin integrity, edema, etc.): Vitals monitored during session.  Pt's SpO2 ranging 85-89% on RA upon PT arrival.  RN present and placed pt on 1L O2 with SpO2 improving to 94%.  Pt then remained at or above 93% on 1L O2 throughout remainder of session.  Supine BP 115/61.  Sitting BP 117/60.  Standing BP 121/80.  Sitting on BSC 106/74.  Seated at end of session 118/63. Conducted neuro screen due to pt's spontaneous shakiness.  Finger to nose Bil mild deficits, fine motor fingers WNL, heel to shin impaired Bil, Sensation testing BUE and BLE WNL.  Strength testing in sitting: BLE grossly 4-/5, RUE grossly 3+/5, LUE grossly 3/5.  Negative for RAM BUE.  Pt denies any new changes with vision and no headaches.  Pt reports this shakiness has been occuring since onset of symptoms ~2 weeks ago.       Pertinent Vitals/Pain Pain Assessment: Faces Faces Pain Scale: Hurts little more Pain Location: abdomen Pain Descriptors / Indicators: Sharp;Moaning Pain Intervention(s): Limited  activity within patient's tolerance;Monitored during session;Utilized relaxation techniques    Home Living                      Prior Function            PT Goals (current goals can now be found in the care plan section) Acute Rehab PT Goals Patient Stated Goal: to feel better and go home PT Goal Formulation: With patient Time For Goal Achievement: 05/17/18 Potential to Achieve Goals: Good Progress towards PT goals: Progressing toward goals    Frequency    Min 2X/week      PT Plan Discharge plan needs to be updated    Co-evaluation              AM-PAC PT "6 Clicks" Daily Activity  Outcome Measure  Difficulty turning over in bed (including adjusting bedclothes, sheets and blankets)?: Unable Difficulty moving from lying on back to sitting on the side of the bed? : Unable Difficulty sitting down on and standing  up from a chair with arms (e.g., wheelchair, bedside commode, etc,.)?: Unable Help needed moving to and from a bed to chair (including a wheelchair)?: A Lot Help needed walking in hospital room?: A Little Help needed climbing 3-5 steps with a railing? : A Lot 6 Click Score: 10    End of Session Equipment Utilized During Treatment: Gait belt;Oxygen Activity Tolerance: Patient tolerated treatment well;Patient limited by fatigue Patient left: with call bell/phone within reach;in chair;with chair alarm set Nurse Communication: Mobility status;Other (comment)(SpO2; spontaneous LE shakiness and knee buckling) PT Visit Diagnosis: Unsteadiness on feet (R26.81);Other abnormalities of gait and mobility (R26.89);History of falling (Z91.81);Pain;Muscle weakness (generalized) (M62.81) Pain - Right/Left: (abdomen) Pain - part of body: (abdomen)     Time: 6503-5465 PT Time Calculation (min) (ACUTE ONLY): 55 min  Charges:  $Gait Training: 8-22 mins $Therapeutic Activity: 23-37 mins                      Session was performed by student PT, Belva Crome,  and directed, overseen, and documented by this PT.  Collie Siad PT, DPT 05/05/2018, 2:57 PM

## 2018-05-05 NOTE — Care Management (Signed)
SATURATION QUALIFICATIONS: (This note is used to comply with regulatory documentation for home oxygen)  Patient Saturations on Room Air at Rest = 85%    Patient Saturations on1 Liters of oxygen while at rest = 93%  Saturations while working with PT

## 2018-05-05 NOTE — Progress Notes (Signed)
[facility] [place] NAME: Jake Gomez    MR#:  [id number]  DATE OF BIRTH:  07-12-1970  SUBJECTIVE:  CHIEF COMPLAINT: Patient is reporting right lower quadrant abdominal pain 4 out of 10 during PD. Have generalized weakness and could not stand up with physical therapy.  REVIEW OF SYSTEMS:  CONSTITUTIONAL: No fever, fatigue or weakness.  EYES: No blurred or double vision.  EARS, NOSE, AND THROAT: No tinnitus or ear pain.  RESPIRATORY: No cough, shortness of breath, wheezing or hemoptysis.  CARDIOVASCULAR: No chest pain, orthopnea, edema.  GASTROINTESTINAL: No nausea, vomiting, diarrhea.  Reporting 6 out of 10 abdominal pain.  GENITOURINARY: No dysuria, hematuria.  ENDOCRINE: No polyuria, nocturia,  HEMATOLOGY: No anemia, easy bruising or bleeding SKIN: No rash or lesion. MUSCULOSKELETAL: No joint pain or arthritis.   NEUROLOGIC: No tingling, numbness, have generalized weakness.  PSYCHIATRY: No anxiety or depression.   DRUG ALLERGIES:  No Known Allergies  VITALS:  Blood pressure 103/61, pulse 81, temperature 99.3 F (37.4 C), temperature source Oral, resp. rate 16, height 5\' 9"  (1.753 m), weight 117 kg, SpO2 99 %.  PHYSICAL EXAMINATION:  GENERAL:  47 y.o.-year-old patient lying in the bed with no acute distress.  EYES: Pupils equal, round, reactive to light and accommodation. No scleral icterus. Extraocular muscles intact.  HEENT: Head atraumatic, normocephalic. Oropharynx and nasopharynx clear.  NECK:  Supple, no jugular venous distention. No thyroid enlargement, no tenderness.  LUNGS: Normal breath sounds bilaterally, no wheezing, rales,rhonchi or crepitation. No use of accessory muscles of respiration.  CARDIOVASCULAR: S1, S2 normal. No murmurs, rubs, or gallops.  ABDOMEN: Soft, right lower quadrant tenderness is present no rebound tenderness, nondistended. Bowel sounds present.  PD catheter intact.  Right groin has a dry  scab but no surrounding redness or tenderness. EXTREMITIES: No pedal edema, cyanosis, or clubbing.  NEUROLOGIC: Cranial nerves II through XII are intact. Muscle strength 4/5 in all extremities. Sensation intact. Gait not checked.  PSYCHIATRIC: The patient is alert and oriented x 3.  SKIN: No obvious rash, lesion, or ulcer.    LABORATORY PANEL:   CBC Recent Labs  Lab 05/04/18 0345  WBC 12.4*  HGB 7.5*  HCT 23.5*  PLT 260   ------------------------------------------------------------------------------------------------------------------  Chemistries  Recent Labs  Lab 05/02/18 0007 05/04/18 0345  NA 130* 128*  K 3.9 3.8  CL 86* 87*  CO2 28 29  GLUCOSE 163* 247*  BUN 57* 54*  CREATININE 10.75* 11.19*  CALCIUM 9.3 9.4  AST 43*  --   ALT 40  --   ALKPHOS 281*  --   BILITOT 0.9  --    ------------------------------------------------------------------------------------------------------------------  Cardiac Enzymes No results for input(s): TROPONINI in the last 168 hours. ------------------------------------------------------------------------------------------------------------------  RADIOLOGY:  No results found.  EKG:   Orders placed or performed during the hospital encounter of 06/10/17  . EKG 12-Lead  . EKG 12-Lead  . EKG 12-Lead  . EKG 12-Lead  . ED EKG within 10 minutes  . ED EKG within 10 minutes  . EKG    ASSESSMENT AND PLAN:   This is a 47 year old male admitted for abdominal pain. 1.  Acute Abdominal pain: SBP  Intractable; with recent history of incomplete treatment for SBP  IV morphine as needed for severe pain. [facility] is recommending to continue ceftazidime and levofloxacin  - to be continued until 4 December. ID has suggested to follow the recommendations from [facility] infectious disease doctor and no changes. 2.  ESRD: On peritoneal dialysis.  Plan as above.  PhosLo with meals    Initially there was a consideration to change to hemodialysis but as  patient is improving now and less abdominal pain, continuing peritoneal dialysis 3.  Hypertension: Reasonably controlled; continue amlodipine, irbesartan and carvedilol.  Labetalol as needed. 4.  Diabetes mellitus type 2: Sliding scale insulin while hospitalized. 5.  Constipation-continue Colace and MiraLAX.  Dulcolax as needed. 6.  Right groin cellulitis-this was treated at [facility] and was discharged on doxycycline, now the groin is clear and does not seem to be having any infection so we will stop doxycycline (was supposed to stop today as per [facility] discharge papers).  7. Generalized weakness PT assessment.   Patient could not stand up with physical therapy due to lack of coordination and weakness. They also noted him to be hypoxic so started on oxygen and he did not have orthostatic drop in blood pressure. We will get an MRI brain and neurology consult.  DVT prophylaxis: Heparin  I had requested nursing staff and physical therapy to continue standing him up and doing exercise every day as he does not want to switch to hemodialysis right now and he cannot go to rehab center with peritoneal dialysis.  Also he was strong and able to walk before this sickness so we should be able to get his strength back very soon and send him home.  All the records are reviewed and case discussed with Care Management/Social Workerr. Management plans discussed with the patient, family and they are in agreement.  CODE STATUS: fc   TOTAL TIME TAKING CARE OF THIS PATIENT: 35  minutes.   POSSIBLE D/C IN 1-2  DAYS, DEPENDING ON CLINICAL CONDITION.  Note: This dictation was prepared with Dragon dictation along with smaller phrase technology. Any transcriptional errors that result from this process are unintentional.   Vaughan Basta M.D on 05/05/2018 at 4:12 PM  Between 7am to 6pm - Pager - [phone] After 6pm go to [email or web address] - password [email or web address] [facility]  Office   [phone]  CC: Primary care physician; Juline Patch, MD

## 2018-05-05 NOTE — Care Management (Signed)
PT has assessed patient and now recommends SNF.  Patient would not be able to go to SNF unless he transitions from PD to HD.  He is aware of this.  At this time he would like to continue PD, and request more time to think about it  RNCM to assess patient for home health

## 2018-05-05 NOTE — Progress Notes (Signed)
[facility]  ROUNDING NOTE   Subjective:   Peritoneal dialysis overnight. Tolerated treatment with assistance of pain medications.   UF of 162mL  Objective:  Vital signs in last 24 hours:  Temp:  [97.5 F (36.4 C)-98.6 F (37 C)] 97.5 F (36.4 C) (11/22 0900) Pulse Rate:  [81-87] 81 (11/22 0900) Resp:  [16-18] 16 (11/22 0900) BP: (110-145)/(58-71) 110/58 (11/22 0900) SpO2:  [87 %-100 %] 93 % (11/22 0900)  Weight change:  Filed Weights   05/02/18 0320 05/03/18 0500 05/03/18 2038  Weight: 113.9 kg 116.9 kg 117 kg    Intake/Output: I/O last 3 completed shifts: In: 47829 [P.O.:360; Other:14500] Out: 0    Intake/Output this shift:  Total I/O In: 120 [P.O.:120] Out: 1601 [Other:1601]  Physical Exam: General: NAD,   Head: Normocephalic, atraumatic. Moist oral mucosal membranes  Eyes: Anicteric, PERRL  Neck: Supple, trachea midline  Lungs:  Clear to auscultation  Heart: Regular rate and rhythm  Abdomen:  Mild tender right lower quadrant  Extremities: no peripheral edema.  Neurologic: Nonfocal, moving all four extremities  Skin: No lesions  Access: PD catheter    Basic Metabolic Panel: Recent Labs  Lab 05/02/18 0007 05/04/18 0345  NA 130* 128*  K 3.9 3.8  CL 86* 87*  CO2 28 29  GLUCOSE 163* 247*  BUN 57* 54*  CREATININE 10.75* 11.19*  CALCIUM 9.3 9.4  PHOS  --  6.2*    Liver Function Tests: Recent Labs  Lab 05/02/18 0007 05/04/18 0345  AST 43*  --   ALT 40  --   ALKPHOS 281*  --   BILITOT 0.9  --   PROT 7.2  --   ALBUMIN 2.6* 2.2*   Recent Labs  Lab 05/02/18 0007  LIPASE 28   No results for input(s): AMMONIA in the last 168 hours.  CBC: Recent Labs  Lab 05/02/18 0007 05/04/18 0345  WBC 12.2* 12.4*  NEUTROABS 10.1*  --   HGB 8.2* 7.5*  HCT 25.8* 23.5*  MCV 102.8* 104.4*  PLT 229 260    Cardiac Enzymes: No results for input(s): CKTOTAL, CKMB, CKMBINDEX, TROPONINI in the last 168 hours.  BNP: Invalid input(s):  POCBNP  CBG: Recent Labs  Lab 05/04/18 1131 05/04/18 1641 05/04/18 2125 05/05/18 0802 05/05/18 1145  GLUCAP 119* 135* 133* 207* 82*    Microbiology: Results for orders placed or performed during the hospital encounter of 05/01/18  MRSA PCR Screening     Status: None   Collection Time: 05/02/18  8:21 AM  Result Value Ref Range Status   MRSA by PCR NEGATIVE NEGATIVE Final    Comment:        The GeneXpert MRSA Assay (FDA approved for NASAL specimens only), is one component of a comprehensive MRSA colonization surveillance program. It is not intended to diagnose MRSA infection nor to guide or monitor treatment for MRSA infections. Performed at [facility], [address] [place], [place] [address]   Body fluid culture     Status: None (Preliminary result)   Collection Time: 05/02/18  4:09 PM  Result Value Ref Range Status   Specimen Description   Final    PLEURAL Performed at [facility], [address] [place], [place] [address]    Special Requests   Final    NONE Performed at [facility], [address] [place], [place] [address]    Gram Stain   Final    FEW WBC PRESENT,BOTH PMN AND MONONUCLEAR NO ORGANISMS  SEEN    Culture   Final    NO GROWTH 3 DAYS Performed at [facility], [place] [address] [place], [place] [address]    Report Status PENDING  Incomplete    Coagulation Studies: No results for input(s): LABPROT, INR in the last 72 hours.  Urinalysis: No results for input(s): COLORURINE, LABSPEC, PHURINE, GLUCOSEU, HGBUR, BILIRUBINUR, KETONESUR, PROTEINUR, UROBILINOGEN, NITRITE, LEUKOCYTESUR in the last 72 hours.  Invalid input(s): APPERANCEUR    Imaging: No results found.   Medications:   . dianeal solution for CAPD/CCPD with additives 2,500 mL/hr at 05/03/18 2123  . dialysis solution 2.5% low-MG/low-CA 15,000 mL (05/03/18 2124)   . amLODipine  10 mg Oral BID  . calcium acetate  2,001 mg Oral TID WC  .  carvedilol  6.25 mg Oral Daily  . docusate sodium  100 mg Oral BID  . fluticasone  2 spray Each Nare Daily  . folic acid  1 mg Oral Daily  . gentamicin cream  1 application Topical Daily  . heparin  5,000 Units Subcutaneous Q8H  . insulin aspart  0-5 Units Subcutaneous QHS  . insulin aspart  0-9 Units Subcutaneous TID WC  . irbesartan  300 mg Oral QHS  . levofloxacin  500 mg Oral Q48H  . polyethylene glycol  17 g Oral Daily  . thiamine  100 mg Oral Daily  . vitamin B-12  1,000 mcg Oral Daily   acetaminophen **OR** acetaminophen, bisacodyl, dianeal solution for CAPD/CCPD with heparin, HYDROmorphone (DILAUDID) injection, labetalol, ondansetron **OR** ondansetron (ZOFRAN) IV, traMADol  Assessment/ Plan:  [name] is a 47 y.o. Hispanic male with end stage renal disease on peritoneal dialysis, hypertension, insulin dependent diabetes mellitus type II, diabetic neuropathy, diabetic retinopathy, hyperlipidemia  [facility] PD EDW 111kg.  CCPD 10 hours 3 liter fills 4 exchanges with last fill 2.5 liters.   1. End Stage Renal Disease: on peritoneal dialysis Peritoneal dialysis scheduled for tonight. Orders prepared. 2.5% dextrose.   2. Peritonitis: pseudomonas aeruginosa on 11/13 at [facility]. Cell count of 9825 with toxic granulocytes Discharged on ceftaxidime 1.5g IP daily and levofloxacin 500mg  every other day.  Discussed case with Dr. Delaine Lame, Infectious disease on 11/20. Agrees with plan.   3. Hypertension: blood pressure at goal.  - amlodipine, carvedilol, irbestartan, labetalol  4. Anemia of chronic kidney disease:  Hemoglobin 7.5 - EPO as outpatient. Last dose was 11/18  5. Secondary Hyperparathyroidism with hyperphosphatemia.   - continue calcium acetate.    LOS: 2 Ashan Cueva 11/22/20192:02 PM

## 2018-05-05 NOTE — Progress Notes (Signed)
Called pharmacy to check on PD solution, solution still not ready. Waiting for PD solution to start CCPD tx.     05/05/18 2009  Hand-Off documentation  Report given to (Full Name) Stark Bray  Report received from (Full Name) Lenna Sciara, Harlow Mares

## 2018-05-05 NOTE — Discharge Instructions (Signed)
Cont antibiotics as advised by [facility].

## 2018-05-05 NOTE — Plan of Care (Signed)
Peritoneal dialysis patient daily. Worked with PT today. Neurology consulted for trimmers in extremities. MRI ordered.  Problem: Education: Goal: Knowledge of General Education information will improve Description Including pain rating scale, medication(s)/side effects and non-pharmacologic comfort measures Outcome: Progressing   Problem: Health Behavior/Discharge Planning: Goal: Ability to manage health-related needs will improve Outcome: Progressing   Problem: Clinical Measurements: Goal: Ability to maintain clinical measurements within normal limits will improve Outcome: Progressing Goal: Will remain free from infection Outcome: Progressing Goal: Diagnostic test results will improve Outcome: Progressing Goal: Respiratory complications will improve Outcome: Progressing Goal: Cardiovascular complication will be avoided Outcome: Progressing   Problem: Activity: Goal: Risk for activity intolerance will decrease Outcome: Progressing   Problem: Nutrition: Goal: Adequate nutrition will be maintained Outcome: Progressing   Problem: Coping: Goal: Level of anxiety will decrease Outcome: Progressing   Problem: Elimination: Goal: Will not experience complications related to bowel motility Outcome: Progressing Goal: Will not experience complications related to urinary retention Outcome: Progressing   Problem: Pain Managment: Goal: General experience of comfort will improve Outcome: Progressing   Problem: Safety: Goal: Ability to remain free from injury will improve Outcome: Progressing   Problem: Skin Integrity: Goal: Risk for impaired skin integrity will decrease Outcome: Progressing

## 2018-05-05 NOTE — Care Management Important Message (Signed)
Copy of signed IM left with patient in room.  

## 2018-05-05 NOTE — Progress Notes (Signed)
PD tx start . Pre PD weight 118.2kg   05/05/18 2059  Cycler Setup  Total Number of Exchanges 5 (including last fill)  Fill Volume 3000  Dianeal Solution Dextrose 2.5% in 6000 mL  Last Bag Alarm 0  Dianeal Additive Other (Comment) (medication (antibiotic) for last fill only )  Last Fill Volume 2500  Fill Time - Minute(s) 10  Drain Time - Minute(s) 20 mins  Dianeal Additive Heparin (in unmedicated solution)  Exit Site Care Performed Yes  Completion  Treatment Status Started  Fluid Balance - CCPD  Total Intake for Exchanges (mL) 14500 ml  Procedure Comments  Peritoneal Dialysis Comments  (pre PD weight 118.2kg)  Education / Care Plan  Dialysis Education Provided Yes  Documented Education in Care Plan Yes  Hand-Off documentation  Report given to (Full Name) Dyke Maes  Report received from (Full Name) Stark Bray

## 2018-05-05 NOTE — Progress Notes (Signed)
Pharmacy Antibiotic Note  Jake Gomez is a 47 y.o. male admitted on 05/01/2018 with pseudomonal peritonitis.  Pharmacy has been consulted for Levaquin dosing. He was admitted to [facility] from 11/12 to 11/15 and found to have pseudomonas peritonitis (positive culture available in care everywhere). He was discharged with po Levaquin but now returns to [facility] with abdominal pain.  Plan: Levaquin 500mg  PO every 48 hours    Height: 5\' 9"  (175.3 cm) Weight: 257 lb 15 oz (117 kg) IBW/kg (Calculated) : 70.7  Temp (24hrs), Avg:98.1 F (36.7 C), Min:97.5 F (36.4 C), Max:98.6 F (37 C)  Recent Labs  Lab 05/02/18 0007 05/04/18 0345  WBC 12.2* 12.4*  CREATININE 10.75* 11.19*    Estimated Creatinine Clearance: 10.5 mL/min (A) (by C-G formula based on SCr of 11.19 mg/dL (H)).    No Known Allergies  Antimicrobials this admission: Levaquin 11/19>> Ceftazidime (in PD bag) 11/19>> Doxycycline 11/19>> 11/21 Vanc 11/19 x1 Cefepime 11/19 x1  Microbiology results: 11/13 peritoneal fluid: pan-S  Thank you for allowing pharmacy to be a part of this patient's care.  Dima Mini A, PharmD 05/05/2018 1:33 PM

## 2018-05-05 NOTE — NC FL2 (Signed)
[place] LEVEL OF CARE SCREENING TOOL     IDENTIFICATION  Patient Name: Jake Gomez Birthdate: Nov 11, 1970 Sex: male Admission Date (Current Location): 05/01/2018  Rosa Sanchez and [place] Number:  Engineering geologist and Address:  [facility], [address], [place], [place] [address]      Provider Number: [id number]  Attending Physician Name and Address:  Vaughan Basta, *  Relative Name and Phone Number:       Current Level of Care: Hospital Recommended Level of Care: [place] Prior Approval Number:    Date Approved/Denied:   PASRR Number: [id number]  Discharge Plan: SNF    Current Diagnoses: Patient Active Problem List   Diagnosis Date Noted  . Intractable abdominal pain 05/02/2018  . Peritonitis ([place]) 05/02/2018  . Chest pain 05/26/2017  . ESRD on peritoneal dialysis ([place]) 05/26/2017  . Chronic renal disease, stage 4, severely decreased glomerular filtration rate between 15-29 mL/min/1.73 square meter ([place]) 01/30/2014  . Acute-on-chronic renal failure ([place]) 09/29/2013  . Diabetes mellitus ([place]) 09/29/2013  . Hyperkalemia, diminished renal excretion 09/29/2013  . Hypertension 09/29/2013    Orientation RESPIRATION BLADDER Height & Weight     Self, Time, Place, Situation  Normal Continent Weight: 257 lb 15 oz (117 kg) Height:  5\' 9"  (175.3 cm)  BEHAVIORAL SYMPTOMS/MOOD NEUROLOGICAL BOWEL NUTRITION STATUS  (none) (none) Continent Diet(Renal )  AMBULATORY STATUS COMMUNICATION OF NEEDS Skin   Extensive Assist Verbally Normal                       Personal Care Assistance Level of Assistance  Bathing, Feeding, Dressing Bathing Assistance: Limited assistance Feeding assistance: Independent Dressing Assistance: Limited assistance     Functional Limitations Info  Sight, Hearing, Speech Sight Info: Adequate Hearing Info: Adequate Speech Info: Adequate    SPECIAL CARE FACTORS FREQUENCY  PT  (By licensed PT), OT (By licensed OT)     PT Frequency: 5 OT Frequency: 5            Contractures Contractures Info: Not present    Additional Factors Info  Code Status, Allergies Code Status Info: Full Code  Allergies Info: NKA           Current Medications (05/05/2018):  This is the current hospital active medication list Current Facility-Administered Medications  Medication Dose Route Frequency Provider Last Rate Last Dose  . acetaminophen (TYLENOL) tablet 650 mg  650 mg Oral Q6H PRN Harrie Foreman, MD   650 mg at 05/02/18 2112   Or  . acetaminophen (TYLENOL) suppository 650 mg  650 mg Rectal Q6H PRN Harrie Foreman, MD      . amLODipine (NORVASC) tablet 10 mg  10 mg Oral BID Harrie Foreman, MD   10 mg at 05/05/18 1046  . bisacodyl (DULCOLAX) EC tablet 10 mg  10 mg Oral Daily PRN Gouru, Aruna, MD      . calcium acetate (PHOSLO) capsule 2,001 mg  2,001 mg Oral TID WC Harrie Foreman, MD   2,001 mg at 05/05/18 1229  . carvedilol (COREG) tablet 6.25 mg  6.25 mg Oral Daily Harrie Foreman, MD   6.25 mg at 05/05/18 1046  . cefTAZidime (FORTAZ) 1,500 mg in dialysis solution 2.5% low-MG/low-CA 3,000 mL dialysis solution   Peritoneal Dialysis Q24H Kolluru, Sarath, MD 2,500 mL/hr at 05/03/18 2123    . dialysis solution 2.5% low-MG/low-CA dianeal solution   Intraperitoneal 5 X Daily Kolluru, Sarath, MD 1,500 mL/hr  at 05/03/18 2124 15,000 mL at 05/03/18 2124  . docusate sodium (COLACE) capsule 100 mg  100 mg Oral BID Harrie Foreman, MD   100 mg at 05/05/18 1135  . fluticasone (FLONASE) 50 MCG/ACT nasal spray 2 spray  2 spray Each Nare Daily Harrie Foreman, MD   2 spray at 05/05/18 1135  . folic acid (FOLVITE) tablet 1 mg  1 mg Oral Daily Vaughan Basta, MD   1 mg at 05/05/18 1046  . gentamicin cream (GARAMYCIN) 0.1 % 1 application  1 application Topical Daily Kolluru, Sarath, MD   1 application at 51/02/58 0817  . heparin in dialysis solution 2.5%  low-MG/low-CA 6,000 mL 6,000 mL dialysis solution   Peritoneal Dialysis PRN Kolluru, Lurena Nida, MD      . heparin injection 5,000 Units  5,000 Units Subcutaneous Q8H Harrie Foreman, MD   5,000 Units at 05/05/18 0604  . HYDROmorphone (DILAUDID) injection 0.5-1 mg  0.5-1 mg Intravenous Q4H PRN Amelia Jo, MD   0.5 mg at 05/05/18 0603  . insulin aspart (novoLOG) injection 0-5 Units  0-5 Units Subcutaneous QHS Harrie Foreman, MD      . insulin aspart (novoLOG) injection 0-9 Units  0-9 Units Subcutaneous TID WC Harrie Foreman, MD   1 Units at 05/05/18 1228  . irbesartan (AVAPRO) tablet 300 mg  300 mg Oral QHS Harrie Foreman, MD   300 mg at 05/04/18 2335  . labetalol (NORMODYNE,TRANDATE) injection 5-10 mg  5-10 mg Intravenous Q2H PRN Harrie Foreman, MD      . levofloxacin [facility]) tablet 500 mg  500 mg Oral Q48H Kolluru, Sarath, MD   500 mg at 05/04/18 1435  . ondansetron (ZOFRAN) tablet 4 mg  4 mg Oral Q6H PRN Harrie Foreman, MD       Or  . ondansetron [facility]) injection 4 mg  4 mg Intravenous Q6H PRN Harrie Foreman, MD   4 mg at 05/05/18 0035  . polyethylene glycol (MIRALAX / GLYCOLAX) packet 17 g  17 g Oral Daily Kolluru, Sarath, MD   17 g at 05/05/18 1045  . thiamine (VITAMIN B-1) tablet 100 mg  100 mg Oral Daily Vaughan Basta, MD   100 mg at 05/05/18 1046  . traMADol (ULTRAM) tablet 50 mg  50 mg Oral Q6H PRN Gouru, Aruna, MD   50 mg at 05/04/18 2346  . tuberculin injection 5 Units  5 Units Intradermal Once Kolluru, Sarath, MD      . vitamin B-12 (CYANOCOBALAMIN) tablet 1,000 mcg  1,000 mcg Oral Daily Vaughan Basta, MD   1,000 mcg at 05/05/18 1046     Discharge Medications: Please see discharge summary for a list of discharge medications.  Relevant Imaging Results:  Relevant Lab Results:   Additional Information IF patient goes to SNF, will change to Hemodialysis.   SSN: [id number]  Annamaria Boots, LCSWA

## 2018-05-06 ENCOUNTER — Other Ambulatory Visit: Payer: Medicare Other

## 2018-05-06 ENCOUNTER — Inpatient Hospital Stay: Payer: Medicare Other

## 2018-05-06 DIAGNOSIS — K659 Peritonitis, unspecified: Secondary | ICD-10-CM | POA: Diagnosis not present

## 2018-05-06 DIAGNOSIS — N186 End stage renal disease: Secondary | ICD-10-CM | POA: Diagnosis not present

## 2018-05-06 DIAGNOSIS — D509 Iron deficiency anemia, unspecified: Secondary | ICD-10-CM | POA: Diagnosis not present

## 2018-05-06 DIAGNOSIS — R531 Weakness: Secondary | ICD-10-CM

## 2018-05-06 DIAGNOSIS — Z992 Dependence on renal dialysis: Secondary | ICD-10-CM | POA: Diagnosis not present

## 2018-05-06 DIAGNOSIS — D631 Anemia in chronic kidney disease: Secondary | ICD-10-CM | POA: Diagnosis not present

## 2018-05-06 DIAGNOSIS — B965 Pseudomonas (aeruginosa) (mallei) (pseudomallei) as the cause of diseases classified elsewhere: Secondary | ICD-10-CM | POA: Diagnosis not present

## 2018-05-06 LAB — CBC
HCT: 26 % — ABNORMAL LOW (ref 39.0–52.0)
HEMOGLOBIN: 8.1 g/dL — AB (ref 13.0–17.0)
MCH: 33.2 pg (ref 26.0–34.0)
MCHC: 31.2 g/dL (ref 30.0–36.0)
MCV: 106.6 fL — ABNORMAL HIGH (ref 80.0–100.0)
Platelets: 256 10*3/uL (ref 150–400)
RBC: 2.44 MIL/uL — AB (ref 4.22–5.81)
RDW: 17 % — AB (ref 11.5–15.5)
WBC: 12.8 10*3/uL — AB (ref 4.0–10.5)
nRBC: 0 % (ref 0.0–0.2)

## 2018-05-06 LAB — RENAL FUNCTION PANEL
ALBUMIN: 2.2 g/dL — AB (ref 3.5–5.0)
ANION GAP: 14 (ref 5–15)
BUN: 49 mg/dL — ABNORMAL HIGH (ref 6–20)
CALCIUM: 10.3 mg/dL (ref 8.9–10.3)
CO2: 30 mmol/L (ref 22–32)
Chloride: 87 mmol/L — ABNORMAL LOW (ref 98–111)
Creatinine, Ser: 10.41 mg/dL — ABNORMAL HIGH (ref 0.61–1.24)
GFR calc Af Amer: 6 mL/min — ABNORMAL LOW (ref >60)
GFR, EST NON AFRICAN AMERICAN: 5 mL/min — AB (ref >60)
Glucose, Bld: 201 mg/dL — ABNORMAL HIGH (ref 70–99)
PHOSPHORUS: 6.3 mg/dL — AB (ref 2.5–4.6)
POTASSIUM: 4.4 mmol/L (ref 3.5–5.1)
Sodium: 131 mmol/L — ABNORMAL LOW (ref 135–145)

## 2018-05-06 LAB — BODY FLUID CULTURE: Culture: NO GROWTH

## 2018-05-06 LAB — HEPATITIS B SURFACE ANTIGEN: Hepatitis B Surface Ag: NEGATIVE

## 2018-05-06 LAB — GLUCOSE, CAPILLARY
Glucose-Capillary: 204 mg/dL — ABNORMAL HIGH (ref 70–99)
Glucose-Capillary: 180 mg/dL — ABNORMAL HIGH (ref 70–99)
Glucose-Capillary: 173 mg/dL — ABNORMAL HIGH (ref 70–99)

## 2018-05-06 NOTE — Consult Note (Signed)
Reason for Consult: Generalized weakness and difficulty coordinating movement Referring Physician:   CC: Abdominal pain, generalized weakness  HPI: Jake Gomez is an 47 y.o. male with past medical history of bacterial peritonitis associated with peritoneal dialysis, stage renal disease on peritoneal dialysis, MRSA cellulitis of right inner thigh, hypertension, hyperlipidemia, and diabetes mellitus type 2 presenting to the ED on 11 /18/2019 with chief complaints of abdominal pain associated with nausea and vomiting.  He was recently seen at [facility] on 04/25/2018 for bacterial peritonitis associated with PD. At [facility] ED patient had elevated BP 188/72 and tachycardic to 105. Afebrile. His Labs showed leukocytosis- 13.1 w/ 1+ L shift, macrocytic anemia - 8.4, mild hyponatremia 133, BUN and Cr 87 and 17.42, elevated Mag and phos at 2.7 and 8.2. Peritoneal fluid was cloudy showed 9k cells and gram stain that showed G+ve cocci and G-ve bacilli. He was treated with  Vancomycin, ceftazidime and Flagyl. He was discharged on 04/28/2018 to follow with his Kidney specialist in [place].  States that he was taking doxycycline at home and tolerating without side effects.  Since being discharged from [facility] has followed up with his kidney specialist Dr. Candiss Norse on 04/30/2018 who advised him that he would be directly admitted pain were to worsen.  During the course of his admission patient developed generalized weakness and could not stand with physical therapy due to lack of coordination and weakness he was also noted to be hypoxic so was placed on oxygen via nasal cannula.  Neurology was therefore consulted for further evaluation.  Past Medical History:  Diagnosis Date  . Diabetes mellitus without complication ([place])   . Hypertension   . Renal disorder     Past Surgical History:  Procedure Laterality Date  . peritonal dialysis      Family History  Problem Relation Age of Onset  . Thyroid disease Mother   . Stroke  Father   . Cancer Father   . Diabetes Father     Social History:  reports that he has quit smoking. He quit smokeless tobacco use about 8 years ago. He reports that he does not drink alcohol or use drugs.  No Known Allergies  Medications:  I have reviewed the patient's current medications. Prior to Admission:  Medications Prior to Admission  Medication Sig Dispense Refill Last Dose  . acetaminophen (TYLENOL) 500 MG tablet Take 500 mg by mouth every 6 (six) hours as needed for mild pain.    Taking  . glipiZIDE (GLUCOTROL) 5 MG tablet Take 1 tablet (5 mg total) by mouth 2 (two) times daily before a meal. 60 tablet 1 Taking  . insulin regular (NOVOLIN R RELION) 100 units/mL injection Inject 0.02 mLs (2 Units total) into the skin daily. (Patient taking differently: Inject subcutaneously daily as directed on the following sliding scale: 2 units for blood glucose reading equal to or greater than 150 and 1 unit for each blood glucose reading of 50.) 10 mL 4 Taking  . amLODipine (NORVASC) 5 MG tablet Take 10 mg by mouth 2 (two) times daily.    Taking  . atorvastatin (LIPITOR) 80 MG tablet Take 80 mg by mouth.   Taking  . bumetanide (BUMEX) 2 MG tablet Take 2 mg by mouth daily.    Taking  . calcium acetate (PHOSLO) 667 MG capsule Take 2,001 mg by mouth 3 (three) times daily with meals.    Taking  . carvedilol (COREG) 6.25 MG tablet Take 6.25 mg by mouth daily.  5 Taking  .  doxycycline (VIBRA-TABS) 100 MG tablet Take 100 mg by mouth 2 (two) times daily. for 7 days  0   . Insulin Syringes, Disposable, U-100 1 ML MISC 1 each by Does not apply route daily. 100 each 0 Taking  . irbesartan (AVAPRO) 300 MG tablet Take 300 mg by mouth at bedtime.    Taking  . labetalol (NORMODYNE) 200 MG tablet Take 200 mg by mouth 2 (two) times daily.    Taking  . levofloxacin (LEVAQUIN) 500 MG tablet Take 500 mg by mouth every other day.  0   . RELION INSULIN SYRINGE 1ML/31G 31G X 5/16" 1 ML MISC 1 EACH BY DOSE NOT APPLY  ROUTE DAILY  0 Taking  . traZODone (DESYREL) 50 MG tablet Take 50 mg by mouth at bedtime. Dr Candiss Norse  0 Taking  . [DISCONTINUED] fluticasone (FLONASE) 50 MCG/ACT nasal spray Place 2 sprays into both nostrils daily. 16 g 6 Taking   Scheduled: . amLODipine  10 mg Oral BID  . calcium acetate  2,001 mg Oral TID WC  . carvedilol  6.25 mg Oral Daily  . docusate sodium  100 mg Oral BID  . fluticasone  2 spray Each Nare Daily  . folic acid  1 mg Oral Daily  . gentamicin cream  1 application Topical Daily  . heparin  5,000 Units Subcutaneous Q8H  . insulin aspart  0-5 Units Subcutaneous QHS  . insulin aspart  0-9 Units Subcutaneous TID WC  . irbesartan  300 mg Oral QHS  . levofloxacin  500 mg Oral Q48H  . polyethylene glycol  17 g Oral Daily  . thiamine  100 mg Oral Daily  . tuberculin  5 Units Intradermal Once  . vitamin B-12  1,000 mcg Oral Daily    ROS: History obtained from the patient   General ROS: negative for - chills, fatigue, fever, night sweats, weight gain or weight loss Psychological ROS: negative for - behavioral disorder, hallucinations, memory difficulties, mood swings or suicidal ideation Ophthalmic ROS: negative for - blurry vision, double vision, eye pain or loss of vision ENT ROS: negative for - epistaxis, nasal discharge, oral lesions, sore throat, tinnitus or vertigo Allergy and Immunology ROS: negative for - hives or itchy/watery eyes Hematological and Lymphatic ROS: negative for - bleeding problems, bruising or swollen lymph nodes Endocrine ROS: negative for - galactorrhea, hair pattern changes, polydipsia/polyuria or temperature intolerance Respiratory ROS: negative for - cough, hemoptysis, shortness of breath or wheezing Cardiovascular ROS: negative for - chest pain, dyspnea on exertion, edema or irregular heartbeat Gastrointestinal ROS: Positive for - abdominal pain, diarrhea, hematemesis, nausea/vomiting Genito-Urinary ROS: negative for - dysuria, hematuria,  incontinence or urinary frequency/urgency Musculoskeletal ROS: negative for - joint swelling . Positive for muscular weakness Neurological ROS: as noted in HPI Dermatological ROS: negative for rash and skin lesion changes  Physical Examination: Blood pressure (!) 134/43, pulse 77, temperature (!) 97.5 F (36.4 C), temperature source Oral, resp. rate 20, height 5\' 9"  (1.753 m), weight 117 kg, SpO2 99 %.  HEENT-  Normocephalic, no lesions, without obvious abnormality.  Normal external eye and conjunctiva.  Normal TM's bilaterally.  Normal auditory canals and external ears. Normal external nose, mucus membranes and septum.  Normal pharynx. Cardiovascular- S1, S2 normal, pulses palpable throughout   Lungs- chest clear, no wheezing, rales, normal symmetric air entry Abdomen- soft, non-tender; bowel sounds normal; no masses,  no organomegaly Extremities- no edema Lymph-no adenopathy palpable Musculoskeletal-no joint tenderness, deformity or swelling Skin-warm and dry, no hyperpigmentation,  vitiligo, or suspicious lesions  Neurological Exam   Mental Status: Alert, oriented, thought content appropriate.  Speech fluent without evidence of aphasia.  Able to follow 3 step commands without difficulty. Attention span and concentration seemed appropriate  Cranial Nerves: II: Discs flat bilaterally; Visual fields grossly normal, pupils equal, round, reactive to light and accommodation III,IV, VI: ptosis not present, extra-ocular motions intact bilaterally V,VII: smile symmetric, facial light touch sensation intact VIII: hearing normal bilaterally IX,X: gag reflex present XI: bilateral shoulder shrug XII: midline tongue extension Motor: Right :  Upper extremity   5/5 Without pronator drift      Left: Upper extremity   5/5 without pronator drift Right:   Lower extremity   5/5                                          Left: Lower extremity   5/5 Tone and bulk:normal tone throughout; no atrophy  noted Sensory: Pinprick and light touch intact bilaterally Deep Tendon Reflexes: 2+ and symmetric throughout Plantars: Right: downgoing                              Left: downgoing Cerebellar: Finger-to-nose testing intact bilaterally. Heel to shin testing normal bilaterally Gait: not tested due to safety concerns  Data Reviewed  Laboratory Studies:   Basic Metabolic Panel: Recent Labs  Lab 05/02/18 0007 05/04/18 0345  NA 130* 128*  K 3.9 3.8  CL 86* 87*  CO2 28 29  GLUCOSE 163* 247*  BUN 57* 54*  CREATININE 10.75* 11.19*  CALCIUM 9.3 9.4  PHOS  --  6.2*    Liver Function Tests: Recent Labs  Lab 05/02/18 0007 05/04/18 0345  AST 43*  --   ALT 40  --   ALKPHOS 281*  --   BILITOT 0.9  --   PROT 7.2  --   ALBUMIN 2.6* 2.2*   Recent Labs  Lab 05/02/18 0007  LIPASE 28   No results for input(s): AMMONIA in the last 168 hours.  CBC: Recent Labs  Lab 05/02/18 0007 05/04/18 0345  WBC 12.2* 12.4*  NEUTROABS 10.1*  --   HGB 8.2* 7.5*  HCT 25.8* 23.5*  MCV 102.8* 104.4*  PLT 229 260    Cardiac Enzymes: No results for input(s): CKTOTAL, CKMB, CKMBINDEX, TROPONINI in the last 168 hours.  BNP: Invalid input(s): POCBNP  CBG: Recent Labs  Lab 05/05/18 1145 05/05/18 1645 05/05/18 1839 05/05/18 2142 05/06/18 0740  GLUCAP 137* 123* 114* 130* 204*    Microbiology: Results for orders placed or performed during the hospital encounter of 05/01/18  MRSA PCR Screening     Status: None   Collection Time: 05/02/18  8:21 AM  Result Value Ref Range Status   MRSA by PCR NEGATIVE NEGATIVE Final    Comment:        The GeneXpert MRSA Assay (FDA approved for NASAL specimens only), is one component of a comprehensive MRSA colonization surveillance program. It is not intended to diagnose MRSA infection nor to guide or monitor treatment for MRSA infections. Performed at [facility], [address] [place], [place] [address]   Body fluid culture      Status: None (Preliminary result)   Collection Time: 05/02/18  4:09 PM  Result Value Ref Range Status   Specimen Description   Final  PLEURAL Performed at [facility], [address], [place], [place] [address]    Special Requests   Final    NONE Performed at [facility], [place], [place] [address]    Gram Stain   Final    FEW WBC PRESENT,BOTH PMN AND MONONUCLEAR NO ORGANISMS SEEN    Culture   Final    NO GROWTH 3 DAYS Performed at [facility], [place] [address], [place], [place] [address]    Report Status PENDING  Incomplete    Coagulation Studies: No results for input(s): LABPROT, INR in the last 72 hours.  Urinalysis: No results for input(s): COLORURINE, LABSPEC, PHURINE, GLUCOSEU, HGBUR, BILIRUBINUR, KETONESUR, PROTEINUR, UROBILINOGEN, NITRITE, LEUKOCYTESUR in the last 168 hours.  Invalid input(s): APPERANCEUR  Lipid Panel:  No results found for: CHOL, TRIG, HDL, CHOLHDL, VLDL, LDLCALC  HgbA1C:  Lab Results  Component Value Date   HGBA1C 7.7 (H) 02/14/2018    Urine Drug Screen:  No results found for: LABOPIA, COCAINSCRNUR, LABBENZ, AMPHETMU, THCU, LABBARB  Alcohol Level: No results for input(s): ETH in the last 168 hours.  Other results: EKG: there are no previous tracings available for comparison.  Imaging: No results found.  Assessment:  47 y.o male with past medical history of bacterial peritonitis associated with peritoneal dialysis, stage renal disease on peritoneal dialysis, MRSA cellulitis of right inner thigh, hypertension, hyperlipidemia, and diabetes mellitus type 2 presenting to the ED on 11 /18/2019 with chief complaints of abdominal pain associated with nausea and vomiting. Current blood tests showed elevated blood glucose levels and elevated complete blood count 12.4, decreased renal function at baseline, Na 128, Phosphorus 6.2, and aspartate transaminase (AST) of 43?IU/L, alanine transaminase (ALT) of  40UI/L, alkaline phosphate 281, albumin of 2.6?g/dL, total bilirubin of 0.9?mg/dL. Patient recently diagnosed with bacterial peritonitis due to peritoneal dialysis and treated with IV abx which was transitioned to PO abx. He is currently on IV abx. He was able to walk to the bathroom yesterday with PT. Exam this morning reassuring with no focal motor or sensory deficit.  Plan: Pt was able to ambulate  I am not convinced its neurological in nature at this time No further imaging from neuro stand point I don't think he needs any further follow up from neurology stand point   This patient was staffed with Dr. Irish Elders, [facility] Frame who personally evaluated patient, reviewed documentation and agreed with assessment and plan of care as above.  Rufina Falco, DNP, FNP-BC Board certified Nurse Practitioner Neurology Department   05/06/2018, 8:22 AM

## 2018-05-06 NOTE — Progress Notes (Addendum)
Pt has been provided with discharge info.

## 2018-05-06 NOTE — Progress Notes (Signed)
[facility]  ROUNDING NOTE   Subjective:   Peritoneal dialysis overnight. Tolerated treatment well. UF of 2458mL.   Patient walking with assistance of walker.   Objective:  Vital signs in last 24 hours:  Temp:  [97.5 F (36.4 C)-99.3 F (37.4 C)] 98.2 F (36.8 C) (11/23 1131) Pulse Rate:  [76-81] 80 (11/23 1131) Resp:  [16-20] 18 (11/23 1131) BP: (103-134)/(43-64) 127/62 (11/23 1131) SpO2:  [91 %-99 %] 97 % (11/23 1131)  Weight change:  Filed Weights   05/02/18 0320 05/03/18 0500 05/03/18 2038  Weight: 113.9 kg 116.9 kg 117 kg    Intake/Output: I/O last 3 completed shifts: In: 29120 [P.O.:120; Other:29000] Out: 1601 [Other:1601]   Intake/Output this shift:  Total I/O In: -  Out: 244 [Other:244]  Physical Exam: General: NAD,   Head: Normocephalic, atraumatic. Moist oral mucosal membranes  Eyes: Anicteric, PERRL  Neck: Supple, trachea midline  Lungs:  Clear to auscultation  Heart: Regular rate and rhythm  Abdomen:  No tenderness  Extremities: no peripheral edema.  Neurologic: Nonfocal, moving all four extremities  Skin: No lesions  Access: PD catheter    Basic Metabolic Panel: Recent Labs  Lab 05/02/18 0007 05/04/18 0345 05/06/18 1126  NA 130* 128* 131*  K 3.9 3.8 4.4  CL 86* 87* 87*  CO2 28 29 30   GLUCOSE 163* 247* 201*  BUN 57* 54* 49*  CREATININE 10.75* 11.19* 10.41*  CALCIUM 9.3 9.4 10.3  PHOS  --  6.2* 6.3*    Liver Function Tests: Recent Labs  Lab 05/02/18 0007 05/04/18 0345 05/06/18 1126  AST 43*  --   --   ALT 40  --   --   ALKPHOS 281*  --   --   BILITOT 0.9  --   --   PROT 7.2  --   --   ALBUMIN 2.6* 2.2* 2.2*   Recent Labs  Lab 05/02/18 0007  LIPASE 28   No results for input(s): AMMONIA in the last 168 hours.  CBC: Recent Labs  Lab 05/02/18 0007 05/04/18 0345 05/06/18 1126  WBC 12.2* 12.4* 12.8*  NEUTROABS 10.1*  --   --   HGB 8.2* 7.5* 8.1*  HCT 25.8* 23.5* 26.0*  MCV 102.8* 104.4* 106.6*  PLT  229 260 256    Cardiac Enzymes: No results for input(s): CKTOTAL, CKMB, CKMBINDEX, TROPONINI in the last 168 hours.  BNP: Invalid input(s): POCBNP  CBG: Recent Labs  Lab 05/05/18 1839 05/05/18 2142 05/06/18 0740 05/06/18 1130 05/06/18 1246  GLUCAP 114* 130* 204* 180* 173*    Microbiology: Results for orders placed or performed during the hospital encounter of 05/01/18  MRSA PCR Screening     Status: None   Collection Time: 05/02/18  8:21 AM  Result Value Ref Range Status   MRSA by PCR NEGATIVE NEGATIVE Final    Comment:        The GeneXpert MRSA Assay (FDA approved for NASAL specimens only), is one component of a comprehensive MRSA colonization surveillance program. It is not intended to diagnose MRSA infection nor to guide or monitor treatment for MRSA infections. Performed at [facility], [address] [place], [place] [address]   Body fluid culture     Status: None   Collection Time: 05/02/18  4:09 PM  Result Value Ref Range Status   Specimen Description   Final    PLEURAL Performed at [facility], [address], [place], [place] [address]    Special Requests  Final    NONE Performed at [facility], [place], [place] [address]    Gram Stain   Final    FEW WBC PRESENT,BOTH PMN AND MONONUCLEAR NO ORGANISMS SEEN    Culture   Final    NO GROWTH 3 DAYS Performed at [facility], [place] [address], [place], [place] [address]    Report Status 05/06/2018 FINAL  Final    Coagulation Studies: No results for input(s): LABPROT, INR in the last 72 hours.  Urinalysis: No results for input(s): COLORURINE, LABSPEC, PHURINE, GLUCOSEU, HGBUR, BILIRUBINUR, KETONESUR, PROTEINUR, UROBILINOGEN, NITRITE, LEUKOCYTESUR in the last 72 hours.  Invalid input(s): APPERANCEUR    Imaging: Mr Brain Wo Contrast  Result Date: 05/06/2018 CLINICAL DATA:  Muscle weakness, generalized. EXAM: MRI HEAD WITHOUT CONTRAST  TECHNIQUE: Multiplanar, multiecho pulse sequences of the brain and surrounding structures were obtained without intravenous contrast. 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 No acute infarct, hemorrhage, hydrocephalus, or collection. Vascular: Major flow voids are preserved Skull and upper cervical spine: No evidence of marrow lesion Sinuses/Orbits: Negative Other: Motion degraded scan requiring fast brain protocol. IMPRESSION: 1. Chronic lacune in the right corona radiata. 2. No acute finding. 3. Motion degraded. Electronically Signed   By: Monte Fantasia M.D.   On: 05/06/2018 12:47     Medications:   . dianeal solution for CAPD/CCPD with additives 2,500 mL/hr at 05/03/18 2123  . dialysis solution 2.5% low-MG/low-CA Stopped (05/05/18 1200)   . amLODipine  10 mg Oral BID  . calcium acetate  2,001 mg Oral TID WC  . carvedilol  6.25 mg Oral Daily  . docusate sodium  100 mg Oral BID  . fluticasone  2 spray Each Nare Daily  . folic acid  1 mg Oral Daily  . gentamicin cream  1 application Topical Daily  . heparin  5,000 Units Subcutaneous Q8H  . insulin aspart  0-5 Units Subcutaneous QHS  . insulin aspart  0-9 Units Subcutaneous TID WC  . irbesartan  300 mg Oral QHS  . levofloxacin  500 mg Oral Q48H  . polyethylene glycol  17 g Oral Daily  . thiamine  100 mg Oral Daily  . tuberculin  5 Units Intradermal Once  . vitamin B-12  1,000 mcg Oral Daily   acetaminophen **OR** acetaminophen, bisacodyl, dianeal solution for CAPD/CCPD with heparin, HYDROmorphone (DILAUDID) injection, labetalol, ondansetron **OR** ondansetron (ZOFRAN) IV, traMADol  Assessment/ Plan:  [name] is a 47 y.o. Hispanic male with end stage renal disease on peritoneal dialysis, hypertension, insulin dependent diabetes mellitus type II, diabetic neuropathy, diabetic retinopathy, hyperlipidemia  [facility] PD  EDW 111kg.  CCPD 10 hours 3 liter fills 4 exchanges with last fill 2.5 liters.   1. End Stage Renal Disease: on peritoneal dialysis  2. Peritonitis: pseudomonas aeruginosa on 11/13 at [facility]. Cell count of 9825 with toxic granulocytes Discharged on ceftaxidime 1.5g IP daily and levofloxacin 500mg  every other day. End date of 05/17/18 Discussed case with Dr. Delaine Lame, Infectious disease on 11/20. Agrees with plan.   3. Hypertension: blood pressure at goal.  - amlodipine, carvedilol, irbestartan, labetalol  4. Anemia of chronic kidney disease:  Hemoglobin 8.1 - EPO as outpatient. Last dose was 11/18  5. Secondary Hyperparathyroidism with hyperphosphatemia.  Phos 6.3 , Calcium upper level of normal at 10.3 - continue calcium acetate with meals. Monitor phos and calcium levels.  LOS: 3 Teana Lindahl 11/23/20191:25 PM

## 2018-05-06 NOTE — Care Management Note (Signed)
Case Management Note  Patient Details  Name: Jake Gomez MRN: [id number] Date of Birth: 1971/06/12  Subjective/Objective:   PT recommended SNF but patient will be unable to go since he prefers to stay on PD opposed to HD. He reports improvement in his mobility over the past couple days. Staff to keep mobile. Has BSC, RW and cane in home from when his parents used them. Has family support in the home and feels home health is an adequate disposition. Per preference, referral placed with [facility], [facility] able to accept for RN, PT and aide services. Family to transport.             Action/Plan:   Expected Discharge Date:  05/05/18               Expected Discharge Plan:  [place]  In-House Referral:     Discharge planning Services  CM Consult  Post Acute Care Choice:  Home Health Choice offered to:  Patient  DME Arranged:    DME Agency:     HH Arranged:  RN, PT, Nurse's Aide [place] Agency:  [place]  Status of Service:  In process, will continue to follow  If discussed at Long Length of Stay Meetings, dates discussed:    Additional Comments:  Latanya Maudlin, RN 05/06/2018, 10:16 AM

## 2018-05-06 NOTE — Discharge Summary (Signed)
[place] [facility] [place] NAME: Jake Gomez    MR#:  [id number]  DATE OF BIRTH:  01/28/1971  DATE OF ADMISSION:  05/01/2018 ADMITTING PHYSICIAN: Harrie Foreman, MD  DATE OF DISCHARGE: 05/06/18  PRIMARY CARE PHYSICIAN: Juline Patch, MD    ADMISSION DIAGNOSIS:  Generalized abdominal pain [R10.84] SBP (spontaneous bacterial peritonitis) ([place]) [K65.2]  DISCHARGE DIAGNOSIS:    SECONDARY DIAGNOSIS:   Past Medical History:  Diagnosis Date  . Diabetes mellitus without complication ([place])   . Hypertension   . Renal disorder     HOSPITAL COURSE:  hpi   Chief Complaint: Abdominal pain HPI: Patient with past medical history of diabetes and hypertension as well as end-stage renal disease on peritoneal dialysis presents to the emergency department complaining of abdominal pain.  The patient reports that he was discharged from [facility] 4 days ago after treatment of spontaneous bacterial peritonitis.  He reports that he was weaned from IV analgesia to oral narcotics prior to discharge but never was completely pain-free.  He began to have some nausea and vomiting which was nonbloody and nonbilious.  His nausea has improved with antiemetics.  He denies fever but continues to complain of tenderness in the abdomen.  His PD catheter was not exchanged.  The patient has been able to partially drain from his last peritoneal dialysis treatment was unable to complete the run due to unbearable pain which prompted him to seek patient in the emergency department.  The patient was stable but due to concern for peritonitis emergency department staff called the hospitalist service for admission   1.  AcuteAbdominal pain: SBP  Intractable;  Clinically improved  with recent history of incomplete treatment for SBP  IV morphine as needed for severe pain given during the hospital course and patient will be discharged with p.o. Percocet and tramadol to take  as needed [facility] is recommending to continue ceftazidime and levofloxacin  - to be continued until 4 December. ID has suggested to follow the recommendations from [facility] infectious disease doctor and no changes. 2. ESRD: On peritoneal dialysis. Plan as above. PhosLo with meals    Initially there was a consideration to change to hemodialysis but as patient is improving now and less abdominal pain, plan is to continue peritoneal dialysis 3. Hypertension: Reasonably controlled; continue amlodipine, irbesartan and carvedilol. Labetalol as needed. 4. Diabetes mellitus type 2: Sliding scale insulin while hospitalized. 5.  Constipation-continue Colace and MiraLAX.  Dulcolax as needed. 6.  Right groin cellulitis-this was treated at [facility] and was discharged on doxycycline, now the groin is clear and does not seem to be having any infection so we will stop doxycycline (was supposed to stop today as per [facility] discharge papers).  7. Generalized weakness PT assessment.  Patient could not stand up with physical therapy due to lack of coordination and weakness.  PT has recommended skilled nursing facility but regarding the patient has to be changed from peritoneal dialysis to hemodialysis.  Patient refused to go to SNF and refused hemodialysis.  Plan is to continue peritoneal dialysis and discharge him home with home health PT  MRI brain with no acute findings.  Seen and evaluated by neurology no further recommendations as the patient is able to walk.  Patient needs home health physical therapy  Okay to discharge patient from nephrology and neurology standpoint with home health PT.  Patient has antibiotic ceftazidime and levofloxacin supplies along with needles and syringes to be  continued as recommended by [facility] infectious disease for a total of 3 weeks DISCHARGE CONDITIONS:  FAIR  CONSULTS OBTAINED:  Treatment Team:  Lavonia Dana, MD [name], MD   PROCEDURES  NONE   DRUG ALLERGIES:  No Known  Allergies  DISCHARGE MEDICATIONS:   Allergies as of 05/06/2018   No Known Allergies     Medication List    STOP taking these medications   doxycycline 100 MG tablet Commonly known as:  VIBRA-TABS     TAKE these medications   acetaminophen 500 MG tablet Commonly known as:  TYLENOL Take 500 mg by mouth every 6 (six) hours as needed for mild pain.   amLODipine 5 MG tablet Commonly known as:  NORVASC Take 10 mg by mouth 2 (two) times daily.   atorvastatin 80 MG tablet Commonly known as:  LIPITOR Take 80 mg by mouth.   bumetanide 2 MG tablet Commonly known as:  BUMEX Take 2 mg by mouth daily.   calcium acetate 667 MG capsule Commonly known as:  PHOSLO Take 2,001 mg by mouth 3 (three) times daily with meals.   carvedilol 6.25 MG tablet Commonly known as:  COREG Take 6.25 mg by mouth daily.   cefTAZidime 1,500 mg in dialysis solution 2.5% low-MG/low-CA 394 MOSM/L 3,000 mL Take IV along with Dialysis exchange solution as advised already.   cyanocobalamin 1000 MCG tablet Take 1 tablet (1,000 mcg total) by mouth daily.   docusate sodium 100 MG capsule Commonly known as:  COLACE Take 1 capsule (100 mg total) by mouth 2 (two) times daily.   fluticasone 50 MCG/ACT nasal spray Commonly known as:  FLONASE Place 2 sprays into both nostrils daily.   folic acid 1 MG tablet Commonly known as:  FOLVITE Take 1 tablet (1 mg total) by mouth daily.   glipiZIDE 5 MG tablet Commonly known as:  GLUCOTROL Take 1 tablet (5 mg total) by mouth 2 (two) times daily before a meal.   insulin regular 100 units/mL injection Commonly known as:  NOVOLIN R,HUMULIN R Inject 0.02 mLs (2 Units total) into the skin daily. What changed:    how much to take  how to take this  when to take this  additional instructions   Insulin Syringes (Disposable) U-100 1 ML Misc 1 each by Does not apply route daily.   irbesartan 300 MG tablet Commonly known as:  AVAPRO Take 300 mg by mouth at  bedtime.   labetalol 200 MG tablet Commonly known as:  NORMODYNE Take 200 mg by mouth 2 (two) times daily.   levofloxacin 500 MG tablet Commonly known as:  LEVAQUIN Take 500 mg by mouth every other day.   oxyCODONE-acetaminophen 5-325 MG tablet Commonly known as:  PERCOCET/ROXICET Take 1 tablet by mouth every 6 (six) hours as needed for severe pain.   polyethylene glycol packet Commonly known as:  MIRALAX / GLYCOLAX Take 17 g by mouth daily.   RELION INSULIN SYRINGE 1ML/31G 31G X 5/16" 1 ML Misc Generic drug:  Insulin Syringe-Needle U-100 1 EACH BY DOSE NOT APPLY ROUTE DAILY   thiamine 100 MG tablet Take 1 tablet (100 mg total) by mouth daily.   traMADol 50 MG tablet Commonly known as:  ULTRAM Take 1 tablet (50 mg total) by mouth 3 (three) times daily as needed for moderate pain.   traZODone 50 MG tablet Commonly known as:  DESYREL Take 50 mg by mouth at bedtime. Dr Candiss Norse        DISCHARGE INSTRUCTIONS:  Continue IV and p.o. antibiotics as recommended by [facility] infectious disease Continue peritoneal dialysis at home Follow-up with nephrology in 2 to 3 days or sooner as needed Follow-up with primary care physician in 2 to 3 days  DIET:  Renal diet  DISCHARGE CONDITION:  Fair  ACTIVITY:  Activity as tolerated per PT   OXYGEN:  Home Oxygen: No.   Oxygen Delivery: room air  DISCHARGE LOCATION:  home   If you experience worsening of your admission symptoms, develop shortness of breath, life threatening emergency, suicidal or homicidal thoughts you must seek medical attention immediately by calling 911 or calling your MD immediately  if symptoms less severe.  You Must read complete instructions/literature along with all the possible adverse reactions/side effects for all the Medicines you take and that have been prescribed to you. Take any new Medicines after you have completely understood and accpet all the possible adverse reactions/side effects.   Please  note  You were cared for by a hospitalist during your hospital stay. If you have any questions about your discharge medications or the care you received while you were in the hospital after you are discharged, you can call the unit and asked to speak with the hospitalist on call if the hospitalist that took care of you is not available. Once you are discharged, your primary care physician will handle any further medical issues. Please note that NO REFILLS for any discharge medications will be authorized once you are discharged, as it is imperative that you return to your primary care physician (or establish a relationship with a primary care physician if you do not have one) for your aftercare needs so that they can reassess your need for medications and monitor your lab values.     Today  Chief Complaint  Patient presents with  . Abdominal Pain   Patient's pain significantly improved and he wants to go home with home health.  He was able to walk in the room yesterday with the help of the nurse.  Refusing hemodialysis and going to nursing home  ROS:  CONSTITUTIONAL: Denies fevers, chills. Denies any fatigue, weakness.  EYES: Denies blurry vision, double vision, eye pain. EARS, NOSE, THROAT: Denies tinnitus, ear pain, hearing loss. RESPIRATORY: Denies cough, wheeze, shortness of breath.  CARDIOVASCULAR: Denies chest pain, palpitations, edema.  GASTROINTESTINAL: Denies nausea, vomiting, diarrhea, abdominal pain. Denies bright red blood per rectum. GENITOURINARY: Denies dysuria, hematuria. ENDOCRINE: Denies nocturia or thyroid problems. HEMATOLOGIC AND LYMPHATIC: Denies easy bruising or bleeding. SKIN: Denies rash or lesion. MUSCULOSKELETAL: Denies pain in neck, back, shoulder, knees, hips or arthritic symptoms.  NEUROLOGIC: Denies paralysis, paresthesias.  PSYCHIATRIC: Denies anxiety or depressive symptoms.   VITAL SIGNS:  Blood pressure 127/62, pulse 80, temperature 98.2 F (36.8 C),  temperature source Oral, resp. rate 18, height 5\' 9"  (1.753 m), weight 117 kg, SpO2 97 %.  I/O:    Intake/Output Summary (Last 24 hours) at 05/06/2018 1221 Last data filed at 05/06/2018 1049 Gross per 24 hour  Intake 14500 ml  Output 244 ml  Net 14256 ml    PHYSICAL EXAMINATION:  GENERAL:  47 y.o.-year-old patient lying in the bed with no acute distress.  EYES: Pupils equal, round, reactive to light and accommodation. No scleral icterus. Extraocular muscles intact.  HEENT: Head atraumatic, normocephalic. Oropharynx and nasopharynx clear.  NECK:  Supple, no jugular venous distention. No thyroid enlargement, no tenderness.  LUNGS: Normal breath sounds bilaterally, no wheezing, rales,rhonchi or crepitation. No use of accessory muscles of  respiration.  CARDIOVASCULAR: S1, S2 normal. No murmurs, rubs, or gallops.  ABDOMEN: Soft, non-tender, non-distended. Bowel sounds present.   EXTREMITIES: No pedal edema, cyanosis, or clubbing.  NEUROLOGIC: Awake and alert and oriented x3 sensation intact. Gait not checked.  PSYCHIATRIC: The patient is alert and oriented x 3.  SKIN: No obvious rash, lesion, or ulcer.   DATA REVIEW:   CBC Recent Labs  Lab 05/06/18 1126  WBC 12.8*  HGB 8.1*  HCT 26.0*  PLT 256    Chemistries  Recent Labs  Lab 05/02/18 0007  05/06/18 1126  NA 130*   < > 131*  K 3.9   < > 4.4  CL 86*   < > 87*  CO2 28   < > 30  GLUCOSE 163*   < > 201*  BUN 57*   < > 49*  CREATININE 10.75*   < > 10.41*  CALCIUM 9.3   < > 10.3  AST 43*  --   --   ALT 40  --   --   ALKPHOS 281*  --   --   BILITOT 0.9  --   --    < > = values in this interval not displayed.    Cardiac Enzymes No results for input(s): TROPONINI in the last 168 hours.  Microbiology Results  Results for orders placed or performed during the hospital encounter of 05/01/18  MRSA PCR Screening     Status: None   Collection Time: 05/02/18  8:21 AM  Result Value Ref Range Status   MRSA by PCR NEGATIVE  NEGATIVE Final    Comment:        The GeneXpert MRSA Assay (FDA approved for NASAL specimens only), is one component of a comprehensive MRSA colonization surveillance program. It is not intended to diagnose MRSA infection nor to guide or monitor treatment for MRSA infections. Performed at [facility], [address], [place], [place] [address]   Body fluid culture     Status: None   Collection Time: 05/02/18  4:09 PM  Result Value Ref Range Status   Specimen Description   Final    PLEURAL Performed at [facility], [address] [place], [place] [address]    Special Requests   Final    NONE Performed at [facility], [address] [place], [place] [address]    Gram Stain   Final    FEW WBC PRESENT,BOTH PMN AND MONONUCLEAR NO ORGANISMS SEEN    Culture   Final    NO GROWTH 3 DAYS Performed at [facility], [place] [address] [place], [place] [address]    Report Status 05/06/2018 FINAL  Final    RADIOLOGY:  No results found.  EKG:   Orders placed or performed during the hospital encounter of 06/10/17  . EKG 12-Lead  . EKG 12-Lead  . EKG 12-Lead  . EKG 12-Lead  . ED EKG within 10 minutes  . ED EKG within 10 minutes  . EKG      Management plans discussed with the patient, family and they are in agreement.  CODE STATUS:     Code Status Orders  (From admission, onward)         Start     Ordered   05/02/18 0309  Full code  Continuous     05/02/18 0309        Code Status History    This patient has a current code status but no historical code  status.      TOTAL TIME TAKING CARE OF THIS PATIENT: 43 minutes.   Note: This dictation was prepared with Dragon dictation along with smaller phrase technology. Any transcriptional errors that result from this process are unintentional.   @[facility] @  on 05/06/2018 at 12:21 PM  Between 7am to 6pm - Pager - [phone]  After 6pm go to [email or web address] - password [email or web address]  [facility]  Office  [phone]  CC: Primary care physician; Juline Patch, MD

## 2018-05-06 NOTE — Progress Notes (Signed)
Pd completed 

## 2018-05-07 DIAGNOSIS — D509 Iron deficiency anemia, unspecified: Secondary | ICD-10-CM | POA: Diagnosis not present

## 2018-05-07 DIAGNOSIS — D631 Anemia in chronic kidney disease: Secondary | ICD-10-CM | POA: Diagnosis not present

## 2018-05-07 DIAGNOSIS — K659 Peritonitis, unspecified: Secondary | ICD-10-CM | POA: Diagnosis not present

## 2018-05-07 DIAGNOSIS — B965 Pseudomonas (aeruginosa) (mallei) (pseudomallei) as the cause of diseases classified elsewhere: Secondary | ICD-10-CM | POA: Diagnosis not present

## 2018-05-07 DIAGNOSIS — Z992 Dependence on renal dialysis: Secondary | ICD-10-CM | POA: Diagnosis not present

## 2018-05-07 DIAGNOSIS — N186 End stage renal disease: Secondary | ICD-10-CM | POA: Diagnosis not present

## 2018-05-08 ENCOUNTER — Telehealth: Payer: Self-pay

## 2018-05-08 DIAGNOSIS — K659 Peritonitis, unspecified: Secondary | ICD-10-CM | POA: Diagnosis not present

## 2018-05-08 DIAGNOSIS — B965 Pseudomonas (aeruginosa) (mallei) (pseudomallei) as the cause of diseases classified elsewhere: Secondary | ICD-10-CM | POA: Diagnosis not present

## 2018-05-08 DIAGNOSIS — N186 End stage renal disease: Secondary | ICD-10-CM | POA: Diagnosis not present

## 2018-05-08 DIAGNOSIS — Z992 Dependence on renal dialysis: Secondary | ICD-10-CM | POA: Diagnosis not present

## 2018-05-08 DIAGNOSIS — D509 Iron deficiency anemia, unspecified: Secondary | ICD-10-CM | POA: Diagnosis not present

## 2018-05-08 DIAGNOSIS — D631 Anemia in chronic kidney disease: Secondary | ICD-10-CM | POA: Diagnosis not present

## 2018-05-08 NOTE — Telephone Encounter (Signed)
Transition Care Management Follow-up Telephone Call  Date of discharge and from where: 05/06/18 [facility]  How have you been since you were released from the hospital? Doing well since discharge but c/o sharpe pain in stomach during 10 hour dialysis at night, pain relieved with pain medication  Any questions or concerns? No   Items Reviewed:  Did the pt receive and understand the discharge instructions provided? Yes   Medications obtained and verified? Yes   Any new allergies since your discharge? No   Dietary orders reviewed? Yes  Do you have support at home? Yes   Functional Questionnaire: (I = Independent and D = Dependent) ADLs: I  Bathing/Dressing- I  Meal Prep- I  Eating- I  Maintaining continence- I  Transferring/Ambulation- I - slight assistance getting up due to feeling weak.  Managing Meds- I  Follow up appointments reviewed:   PCP Hospital f/u appt confirmed? Yes  Scheduled to see Dr. Ronnald Ramp on 05/24/18 @ 1:20.  [facility] f/u appt confirmed? Yes  Scheduled to see nephrologist tomorrow.  Are transportation arrangements needed? No   If their condition worsens, is the pt aware to call PCP or go to the Emergency Dept.? Yes  Was the patient provided with contact information for the PCP's office or ED? Yes  Was to pt encouraged to call back with questions or concerns? Yes

## 2018-05-09 ENCOUNTER — Ambulatory Visit: Payer: Medicare Other | Admitting: Cardiovascular Disease

## 2018-05-09 DIAGNOSIS — Z992 Dependence on renal dialysis: Secondary | ICD-10-CM | POA: Diagnosis not present

## 2018-05-09 DIAGNOSIS — D509 Iron deficiency anemia, unspecified: Secondary | ICD-10-CM | POA: Diagnosis not present

## 2018-05-09 DIAGNOSIS — B965 Pseudomonas (aeruginosa) (mallei) (pseudomallei) as the cause of diseases classified elsewhere: Secondary | ICD-10-CM | POA: Diagnosis not present

## 2018-05-09 DIAGNOSIS — K659 Peritonitis, unspecified: Secondary | ICD-10-CM | POA: Diagnosis not present

## 2018-05-09 DIAGNOSIS — N186 End stage renal disease: Secondary | ICD-10-CM | POA: Diagnosis not present

## 2018-05-09 DIAGNOSIS — D631 Anemia in chronic kidney disease: Secondary | ICD-10-CM | POA: Diagnosis not present

## 2018-05-09 NOTE — Progress Notes (Deleted)
Cardiology Office Note   Date:  05/09/2018   ID:  Jake Gomez, DOB 08-03-1970, MRN [id number]  PCP:  Juline Patch, MD  Cardiologist:  Kathlyn Sacramento, MD   No chief complaint on file.     History of Present Illness: Jake Gomez is a 47 y.o. male who presents for ***    Past Medical History:  Diagnosis Date  . Diabetes mellitus without complication ([place])   . Hypertension   . Renal disorder     Past Surgical History:  Procedure Laterality Date  . peritonal dialysis       Current Outpatient Medications  Medication Sig Dispense Refill  . acetaminophen (TYLENOL) 500 MG tablet Take 500 mg by mouth every 6 (six) hours as needed for mild pain.     Marland Kitchen amLODipine (NORVASC) 5 MG tablet Take 10 mg by mouth 2 (two) times daily.     Marland Kitchen atorvastatin (LIPITOR) 80 MG tablet Take 80 mg by mouth.    . bumetanide (BUMEX) 2 MG tablet Take 2 mg by mouth daily.     . calcium acetate (PHOSLO) 667 MG capsule Take 2,001 mg by mouth 3 (three) times daily with meals.     . carvedilol (COREG) 6.25 MG tablet Take 6.25 mg by mouth daily.  5  . cefTAZidime 1,500 mg in dialysis solution 2.5% low-MG/low-CA 394 MOSM/L 3,000 mL Take IV along with Dialysis exchange solution as advised already. 15 g 0  . docusate sodium (COLACE) 100 MG capsule Take 1 capsule (100 mg total) by mouth 2 (two) times daily. 10 capsule 0  . fluticasone (FLONASE) 50 MCG/ACT nasal spray Place 2 sprays into both nostrils daily. 16 g 6  . folic acid (FOLVITE) 1 MG tablet Take 1 tablet (1 mg total) by mouth daily. 30 tablet 0  . glipiZIDE (GLUCOTROL) 5 MG tablet Take 1 tablet (5 mg total) by mouth 2 (two) times daily before a meal. 60 tablet 1  . insulin regular (NOVOLIN R RELION) 100 units/mL injection Inject 0.02 mLs (2 Units total) into the skin daily. (Patient taking differently: Inject subcutaneously daily as directed on the following sliding scale: 2 units for blood glucose reading equal to or greater than 150 and 1 unit  for each blood glucose reading of 50.) 10 mL 4  . Insulin Syringes, Disposable, U-100 1 ML MISC 1 each by Does not apply route daily. 100 each 0  . irbesartan (AVAPRO) 300 MG tablet Take 300 mg by mouth at bedtime.     Marland Kitchen labetalol (NORMODYNE) 200 MG tablet Take 200 mg by mouth 2 (two) times daily.     Marland Kitchen levofloxacin (LEVAQUIN) 500 MG tablet Take 500 mg by mouth every other day.  0  . oxyCODONE-acetaminophen (PERCOCET) 5-325 MG tablet Take 1 tablet by mouth every 6 (six) hours as needed for severe pain. 20 tablet 0  . polyethylene glycol (MIRALAX / GLYCOLAX) packet Take 17 g by mouth daily. 14 each 0  . RELION INSULIN SYRINGE 1ML/31G 31G X 5/16" 1 ML MISC 1 EACH BY DOSE NOT APPLY ROUTE DAILY  0  . thiamine 100 MG tablet Take 1 tablet (100 mg total) by mouth daily. 30 tablet 0  . traMADol (ULTRAM) 50 MG tablet Take 1 tablet (50 mg total) by mouth 3 (three) times daily as needed for moderate pain. 30 tablet 0  . traZODone (DESYREL) 50 MG tablet Take 50 mg by mouth at bedtime. Dr Candiss Norse  0  . vitamin B-12 1000  MCG tablet Take 1 tablet (1,000 mcg total) by mouth daily. 30 tablet 0   No current facility-administered medications for this visit.     Allergies:   Patient has no known allergies.    Social History:  The patient  reports that he has quit smoking. He quit smokeless tobacco use about 8 years ago. He reports that he does not drink alcohol or use drugs.   Family History:  The patient's ***family history includes Cancer in his father; Diabetes in his father; Stroke in his father; Thyroid disease in his mother.    ROS:  Please see the history of present illness.   Otherwise, review of systems are positive for {NONE DEFAULTED:18576::"none"}.   All other systems are reviewed and negative.    PHYSICAL EXAM: VS:  There were no vitals taken for this visit. , BMI There is no height or weight on file to calculate BMI. GEN: Well nourished, well developed, in no acute distress  HEENT: normal    Neck: no JVD, carotid bruits, or masses Cardiac: ***RRR; no murmurs, rubs, or gallops,no edema  Respiratory:  clear to auscultation bilaterally, normal work of breathing GI: soft, nontender, nondistended, + BS MS: no deformity or atrophy  Skin: warm and dry, no rash Neuro:  Strength and sensation are intact Psych: euthymic mood, full affect   EKG:  EKG {ACTION; IS/IS TJQ:[id number]} ordered today. The ekg ordered today demonstrates ***   Recent Labs: 06/10/2017: B Natriuretic Peptide 244.0 05/02/2018: ALT 40; TSH 2.788 05/06/2018: BUN 49; Creatinine, Ser 10.41; Hemoglobin 8.1; Platelets 256; Potassium 4.4; Sodium 131    Lipid Panel No results found for: CHOL, TRIG, HDL, CHOLHDL, VLDL, LDLCALC, LDLDIRECT    Wt Readings from Last 3 Encounters:  05/03/18 257 lb 15 oz (117 kg)  03/09/18 263 lb (119.3 kg)  02/17/18 263 lb 9.6 oz (119.6 kg)      Other studies Reviewed: Additional studies/ records that were reviewed today include: ***. Review of the above records demonstrates: ***  No flowsheet data found.    ASSESSMENT AND PLAN:  1.  ***    Disposition:   FU with *** in {gen number [id number]} {Days to years:10300}  Signed,  Kathlyn Sacramento, MD  05/09/2018 1:59 PM    [facility]

## 2018-05-10 ENCOUNTER — Encounter: Payer: Self-pay | Admitting: Cardiovascular Disease

## 2018-05-10 DIAGNOSIS — Z992 Dependence on renal dialysis: Secondary | ICD-10-CM | POA: Diagnosis not present

## 2018-05-10 DIAGNOSIS — D509 Iron deficiency anemia, unspecified: Secondary | ICD-10-CM | POA: Diagnosis not present

## 2018-05-10 DIAGNOSIS — B965 Pseudomonas (aeruginosa) (mallei) (pseudomallei) as the cause of diseases classified elsewhere: Secondary | ICD-10-CM | POA: Diagnosis not present

## 2018-05-10 DIAGNOSIS — N186 End stage renal disease: Secondary | ICD-10-CM | POA: Diagnosis not present

## 2018-05-10 DIAGNOSIS — K659 Peritonitis, unspecified: Secondary | ICD-10-CM | POA: Diagnosis not present

## 2018-05-10 DIAGNOSIS — D631 Anemia in chronic kidney disease: Secondary | ICD-10-CM | POA: Diagnosis not present

## 2018-05-11 DIAGNOSIS — K659 Peritonitis, unspecified: Secondary | ICD-10-CM | POA: Diagnosis not present

## 2018-05-11 DIAGNOSIS — N186 End stage renal disease: Secondary | ICD-10-CM | POA: Diagnosis not present

## 2018-05-11 DIAGNOSIS — D631 Anemia in chronic kidney disease: Secondary | ICD-10-CM | POA: Diagnosis not present

## 2018-05-11 DIAGNOSIS — Z992 Dependence on renal dialysis: Secondary | ICD-10-CM | POA: Diagnosis not present

## 2018-05-11 DIAGNOSIS — B965 Pseudomonas (aeruginosa) (mallei) (pseudomallei) as the cause of diseases classified elsewhere: Secondary | ICD-10-CM | POA: Diagnosis not present

## 2018-05-11 DIAGNOSIS — D509 Iron deficiency anemia, unspecified: Secondary | ICD-10-CM | POA: Diagnosis not present

## 2018-05-12 DIAGNOSIS — D631 Anemia in chronic kidney disease: Secondary | ICD-10-CM | POA: Diagnosis not present

## 2018-05-12 DIAGNOSIS — B965 Pseudomonas (aeruginosa) (mallei) (pseudomallei) as the cause of diseases classified elsewhere: Secondary | ICD-10-CM | POA: Diagnosis not present

## 2018-05-12 DIAGNOSIS — N186 End stage renal disease: Secondary | ICD-10-CM | POA: Diagnosis not present

## 2018-05-12 DIAGNOSIS — Z992 Dependence on renal dialysis: Secondary | ICD-10-CM | POA: Diagnosis not present

## 2018-05-12 DIAGNOSIS — K659 Peritonitis, unspecified: Secondary | ICD-10-CM | POA: Diagnosis not present

## 2018-05-12 DIAGNOSIS — D509 Iron deficiency anemia, unspecified: Secondary | ICD-10-CM | POA: Diagnosis not present

## 2018-05-13 DIAGNOSIS — D509 Iron deficiency anemia, unspecified: Secondary | ICD-10-CM | POA: Diagnosis not present

## 2018-05-13 DIAGNOSIS — K659 Peritonitis, unspecified: Secondary | ICD-10-CM | POA: Diagnosis not present

## 2018-05-13 DIAGNOSIS — B965 Pseudomonas (aeruginosa) (mallei) (pseudomallei) as the cause of diseases classified elsewhere: Secondary | ICD-10-CM | POA: Diagnosis not present

## 2018-05-13 DIAGNOSIS — N186 End stage renal disease: Secondary | ICD-10-CM | POA: Diagnosis not present

## 2018-05-13 DIAGNOSIS — Z992 Dependence on renal dialysis: Secondary | ICD-10-CM | POA: Diagnosis not present

## 2018-05-13 DIAGNOSIS — D631 Anemia in chronic kidney disease: Secondary | ICD-10-CM | POA: Diagnosis not present

## 2018-05-14 DIAGNOSIS — D509 Iron deficiency anemia, unspecified: Secondary | ICD-10-CM | POA: Diagnosis not present

## 2018-05-14 DIAGNOSIS — N186 End stage renal disease: Secondary | ICD-10-CM | POA: Diagnosis not present

## 2018-05-14 DIAGNOSIS — Z992 Dependence on renal dialysis: Secondary | ICD-10-CM | POA: Diagnosis not present

## 2018-05-14 DIAGNOSIS — D631 Anemia in chronic kidney disease: Secondary | ICD-10-CM | POA: Diagnosis not present

## 2018-05-15 ENCOUNTER — Encounter: Payer: Self-pay | Admitting: Family Medicine

## 2018-05-15 ENCOUNTER — Ambulatory Visit (INDEPENDENT_AMBULATORY_CARE_PROVIDER_SITE_OTHER): Payer: Medicare Other | Admitting: Family Medicine

## 2018-05-15 ENCOUNTER — Ambulatory Visit: Payer: Medicare Other | Admitting: Family Medicine

## 2018-05-15 VITALS — BP 130/80 | HR 90 | Ht 69.0 in | Wt 255.0 lb

## 2018-05-15 DIAGNOSIS — R369 Urethral discharge, unspecified: Secondary | ICD-10-CM | POA: Diagnosis not present

## 2018-05-15 DIAGNOSIS — E782 Mixed hyperlipidemia: Secondary | ICD-10-CM

## 2018-05-15 DIAGNOSIS — Z23 Encounter for immunization: Secondary | ICD-10-CM

## 2018-05-15 DIAGNOSIS — Z992 Dependence on renal dialysis: Secondary | ICD-10-CM | POA: Diagnosis not present

## 2018-05-15 DIAGNOSIS — N481 Balanitis: Secondary | ICD-10-CM

## 2018-05-15 DIAGNOSIS — Z794 Long term (current) use of insulin: Secondary | ICD-10-CM | POA: Diagnosis not present

## 2018-05-15 DIAGNOSIS — N186 End stage renal disease: Secondary | ICD-10-CM

## 2018-05-15 DIAGNOSIS — E1122 Type 2 diabetes mellitus with diabetic chronic kidney disease: Secondary | ICD-10-CM | POA: Diagnosis not present

## 2018-05-15 DIAGNOSIS — D509 Iron deficiency anemia, unspecified: Secondary | ICD-10-CM | POA: Diagnosis not present

## 2018-05-15 DIAGNOSIS — D631 Anemia in chronic kidney disease: Secondary | ICD-10-CM | POA: Diagnosis not present

## 2018-05-15 MED ORDER — ATORVASTATIN CALCIUM 80 MG PO TABS: 80.0000 mg | ORAL_TABLET | Freq: Every morning | ORAL | 5 refills | Status: DC

## 2018-05-15 MED ORDER — FLUCONAZOLE 150 MG PO TABS: 150.0000 mg | ORAL_TABLET | Freq: Once | ORAL | 0 refills | Status: AC

## 2018-05-15 MED ORDER — DOXYCYCLINE HYCLATE 100 MG PO TABS: 100.0000 mg | ORAL_TABLET | Freq: Two times a day (BID) | ORAL | 0 refills | Status: DC

## 2018-05-15 NOTE — Progress Notes (Signed)
Date:  05/15/2018   Name:  Jake Gomez   DOB:  [birth year]   MRN:  [id number]   Chief Complaint: Penis Pain (white discharge and very painful); Hyperlipidemia (nneds refill on chol med); and pneum vacc (needs 23) Penis Pain  The patient's primary symptoms include genital lesions, penile discharge and penile pain. The patient's pertinent negatives include no genital injury, genital itching, pelvic pain, priapism, scrotal swelling or testicular pain. The current episode started in the past 7 days. The problem occurs intermittently. The problem has been gradually worsening. The pain is medium. Associated symptoms include frequency and hesitancy. Pertinent negatives include no abdominal pain, chest pain, chills, constipation, coughing, diarrhea, dysuria, fever, flank pain, headaches, nausea, rash, shortness of breath, sore throat or urgency. The penile discharge was white. The treatment provided moderate relief. He is not sexually active. There is no history of BPH, chlamydia, cryptorchidism, herpes simplex or prostatitis.  Hyperlipidemia  This is a chronic problem. The current episode started more than 1 year ago. The problem is controlled. Recent lipid tests were reviewed and are variable. Pertinent negatives include no chest pain, myalgias or shortness of breath.     Review of Systems  Constitutional: Negative for chills and fever.  HENT: Negative for drooling, ear discharge, ear pain and sore throat.   Respiratory: Negative for cough, shortness of breath and wheezing.   Cardiovascular: Negative for chest pain, palpitations and leg swelling.  Gastrointestinal: Negative for abdominal pain, blood in stool, constipation, diarrhea and nausea.  Endocrine: Negative for polydipsia.  Genitourinary: Positive for discharge, frequency, hesitancy and penile pain. Negative for dysuria, flank pain, hematuria, pelvic pain, scrotal swelling, testicular pain and urgency.  Musculoskeletal: Negative for back  pain, myalgias and neck pain.  Skin: Negative for rash.  Allergic/Immunologic: Negative for environmental allergies.  Neurological: Negative for dizziness, seizures, numbness and headaches.  Hematological: Does not bruise/bleed easily.  Psychiatric/Behavioral: Negative for suicidal ideas. The patient is not nervous/anxious.     Patient Active Problem List   Diagnosis Date Noted  . Intractable abdominal pain 05/02/2018  . Peritonitis ([place]) 05/02/2018  . Chest pain 05/26/2017  . ESRD on peritoneal dialysis ([place]) 05/26/2017  . Chronic renal disease, stage 4, severely decreased glomerular filtration rate between 15-29 mL/min/1.73 square meter ([place]) 01/30/2014  . Acute-on-chronic renal failure ([place]) 09/29/2013  . Diabetes mellitus ([place]) 09/29/2013  . Hyperkalemia, diminished renal excretion 09/29/2013  . Hypertension 09/29/2013    No Known Allergies  Past Surgical History:  Procedure Laterality Date  . peritonal dialysis      Social History   Tobacco Use  . Smoking status: Former Research scientist (life sciences)  . Smokeless tobacco: Former Systems developer    Quit date: 07/16/2009  Substance Use Topics  . Alcohol use: No    Frequency: Never  . Drug use: No     Medication list has been reviewed and updated.  Current Meds  Medication Sig  . acetaminophen (TYLENOL) 500 MG tablet Take 500 mg by mouth every 6 (six) hours as needed for mild pain.   Marland Kitchen atorvastatin (LIPITOR) 80 MG tablet Take 80 mg by mouth.  . bumetanide (BUMEX) 2 MG tablet Take 2 mg by mouth daily.   . calcium acetate (PHOSLO) 667 MG capsule Take 2,001 mg by mouth 3 (three) times daily with meals.   . carvedilol (COREG) 6.25 MG tablet Take 6.25 mg by mouth daily.  . cefTAZidime 1,500 mg in dialysis solution 2.5% low-MG/low-CA 394 MOSM/L 3,000 mL Take IV along with Dialysis exchange  solution as advised already.  . docusate sodium (COLACE) 100 MG capsule Take 1 capsule (100 mg total) by mouth 2 (two) times daily.  . fluticasone (FLONASE) 50 MCG/ACT  nasal spray Place 2 sprays into both nostrils daily.  . folic acid (FOLVITE) 1 MG tablet Take 1 tablet (1 mg total) by mouth daily.  Marland Kitchen glipiZIDE (GLUCOTROL) 5 MG tablet Take 1 tablet (5 mg total) by mouth 2 (two) times daily before a meal.  . insulin regular (NOVOLIN R RELION) 100 units/mL injection Inject 0.02 mLs (2 Units total) into the skin daily. (Patient taking differently: Inject subcutaneously daily as directed on the following sliding scale: 2 units for blood glucose reading equal to or greater than 150 and 1 unit for each blood glucose reading of 50.)  . Insulin Syringes, Disposable, U-100 1 ML MISC 1 each by Does not apply route daily.  . irbesartan (AVAPRO) 300 MG tablet Take 300 mg by mouth at bedtime.   Marland Kitchen oxyCODONE-acetaminophen (PERCOCET) 5-325 MG tablet Take 1 tablet by mouth every 6 (six) hours as needed for severe pain.  . polyethylene glycol (MIRALAX / GLYCOLAX) packet Take 17 g by mouth daily.  Marland Kitchen RELION INSULIN SYRINGE 1ML/31G 31G X 5/16" 1 ML MISC 1 EACH BY DOSE NOT APPLY ROUTE DAILY  . traMADol (ULTRAM) 50 MG tablet Take 1 tablet (50 mg total) by mouth 3 (three) times daily as needed for moderate pain.  . traZODone (DESYREL) 50 MG tablet Take 50 mg by mouth at bedtime. Dr Candiss Norse  . vitamin B-12 1000 MCG tablet Take 1 tablet (1,000 mcg total) by mouth daily.  . [DISCONTINUED] amLODipine (NORVASC) 5 MG tablet Take 10 mg by mouth 2 (two) times daily.   . [DISCONTINUED] thiamine 100 MG tablet Take 1 tablet (100 mg total) by mouth daily.    PHQ 2/9 Scores 08/05/2017  PHQ - 2 Score 0  PHQ- 9 Score 0    Physical Exam  Constitutional: He is oriented to person, place, and time.  HENT:  Head: Normocephalic.  Right Ear: External ear normal.  Left Ear: External ear normal.  Nose: Nose normal.  Mouth/Throat: Oropharynx is clear and moist.  Eyes: Pupils are equal, round, and reactive to light. Conjunctivae and EOM are normal. Right eye exhibits no discharge. Left eye exhibits no  discharge. No scleral icterus.  Neck: Normal range of motion. Neck supple. No JVD present. No tracheal deviation present. No thyromegaly present.  Cardiovascular: Normal rate, regular rhythm, normal heart sounds and intact distal pulses. Exam reveals no gallop and no friction rub.  No murmur heard. Pulmonary/Chest: Breath sounds normal. No respiratory distress. He has no wheezes. He has no rales.  Abdominal: Soft. Bowel sounds are normal. He exhibits no mass. There is no hepatosplenomegaly. There is no tenderness. There is no rebound, no guarding and no CVA tenderness.  Genitourinary: Uncircumcised. Penile erythema and penile tenderness present. Discharge found.  Musculoskeletal: Normal range of motion. He exhibits no edema or tenderness.  Lymphadenopathy:    He has no cervical adenopathy.  Neurological: He is alert and oriented to person, place, and time. He has normal strength and normal reflexes. No cranial nerve deficit.  Skin: Skin is warm. No rash noted.  Nursing note and vitals reviewed.   BP 130/80   Pulse 90   Ht 5\' 9"  (1.753 m)   Wt 255 lb (115.7 kg)   BMI 37.66 kg/m   Assessment and Plan:  1. Balanitis Acute.  Will prescribe doxycycline 100 mg  twice a day for 10 days and 1 Diflucan 150 mg tablet as one-time dose. - doxycycline (VIBRA-TABS) 100 MG tablet; Take 1 tablet (100 mg total) by mouth 2 (two) times daily.  Dispense: 20 tablet; Refill: 0 - fluconazole (DIFLUCAN) 150 MG tablet; Take 1 tablet (150 mg total) by mouth once for 1 dose.  Dispense: 1 tablet; Refill: 0  2. Type 2 diabetes mellitus with chronic kidney disease on chronic dialysis, with long-term current use of insulin ([place]) sched diabetic eye exam for pt at [facility] on Jun 15, 2018 @ 3:30 in [place]  3. Penile discharge Prescribed doxycycline 100 mg twice a day. - doxycycline (VIBRA-TABS) 100 MG tablet; Take 1 tablet (100 mg total) by mouth 2 (two) times daily.  Dispense: 20 tablet; Refill: 0  4.  Mixed hyperlipidemia Chronic.  Controlled.  Continue atorvastatin 80 mg once a day. - atorvastatin (LIPITOR) 80 MG tablet; Take 1 tablet (80 mg total) by mouth every morning.  Dispense: 30 tablet; Refill: 5  5. Need for 23-polyvalent pneumococcal polysaccharide vaccine Discussed and administered. - Pneumococcal polysaccharide vaccine 23-valent greater than or equal to 2yo subcutaneous/IM   Dr. Otilio Miu [facility]  05/15/2018

## 2018-05-16 DIAGNOSIS — D509 Iron deficiency anemia, unspecified: Secondary | ICD-10-CM | POA: Diagnosis not present

## 2018-05-16 DIAGNOSIS — D631 Anemia in chronic kidney disease: Secondary | ICD-10-CM | POA: Diagnosis not present

## 2018-05-16 DIAGNOSIS — N186 End stage renal disease: Secondary | ICD-10-CM | POA: Diagnosis not present

## 2018-05-16 DIAGNOSIS — Z992 Dependence on renal dialysis: Secondary | ICD-10-CM | POA: Diagnosis not present

## 2018-05-17 DIAGNOSIS — D631 Anemia in chronic kidney disease: Secondary | ICD-10-CM | POA: Diagnosis not present

## 2018-05-17 DIAGNOSIS — D509 Iron deficiency anemia, unspecified: Secondary | ICD-10-CM | POA: Diagnosis not present

## 2018-05-17 DIAGNOSIS — Z992 Dependence on renal dialysis: Secondary | ICD-10-CM | POA: Diagnosis not present

## 2018-05-17 DIAGNOSIS — N186 End stage renal disease: Secondary | ICD-10-CM | POA: Diagnosis not present

## 2018-05-18 DIAGNOSIS — D631 Anemia in chronic kidney disease: Secondary | ICD-10-CM | POA: Diagnosis not present

## 2018-05-18 DIAGNOSIS — Z992 Dependence on renal dialysis: Secondary | ICD-10-CM | POA: Diagnosis not present

## 2018-05-18 DIAGNOSIS — D509 Iron deficiency anemia, unspecified: Secondary | ICD-10-CM | POA: Diagnosis not present

## 2018-05-18 DIAGNOSIS — N186 End stage renal disease: Secondary | ICD-10-CM | POA: Diagnosis not present

## 2018-05-19 DIAGNOSIS — D631 Anemia in chronic kidney disease: Secondary | ICD-10-CM | POA: Diagnosis not present

## 2018-05-19 DIAGNOSIS — D509 Iron deficiency anemia, unspecified: Secondary | ICD-10-CM | POA: Diagnosis not present

## 2018-05-19 DIAGNOSIS — Z992 Dependence on renal dialysis: Secondary | ICD-10-CM | POA: Diagnosis not present

## 2018-05-19 DIAGNOSIS — N186 End stage renal disease: Secondary | ICD-10-CM | POA: Diagnosis not present

## 2018-05-20 DIAGNOSIS — Z992 Dependence on renal dialysis: Secondary | ICD-10-CM | POA: Diagnosis not present

## 2018-05-20 DIAGNOSIS — D631 Anemia in chronic kidney disease: Secondary | ICD-10-CM | POA: Diagnosis not present

## 2018-05-20 DIAGNOSIS — N186 End stage renal disease: Secondary | ICD-10-CM | POA: Diagnosis not present

## 2018-05-20 DIAGNOSIS — D509 Iron deficiency anemia, unspecified: Secondary | ICD-10-CM | POA: Diagnosis not present

## 2018-05-21 DIAGNOSIS — D509 Iron deficiency anemia, unspecified: Secondary | ICD-10-CM | POA: Diagnosis not present

## 2018-05-21 DIAGNOSIS — D631 Anemia in chronic kidney disease: Secondary | ICD-10-CM | POA: Diagnosis not present

## 2018-05-21 DIAGNOSIS — Z992 Dependence on renal dialysis: Secondary | ICD-10-CM | POA: Diagnosis not present

## 2018-05-21 DIAGNOSIS — N186 End stage renal disease: Secondary | ICD-10-CM | POA: Diagnosis not present

## 2018-05-22 ENCOUNTER — Telehealth (INDEPENDENT_AMBULATORY_CARE_PROVIDER_SITE_OTHER): Payer: Self-pay

## 2018-05-22 DIAGNOSIS — Z992 Dependence on renal dialysis: Secondary | ICD-10-CM | POA: Diagnosis not present

## 2018-05-22 DIAGNOSIS — D631 Anemia in chronic kidney disease: Secondary | ICD-10-CM | POA: Diagnosis not present

## 2018-05-22 DIAGNOSIS — D509 Iron deficiency anemia, unspecified: Secondary | ICD-10-CM | POA: Diagnosis not present

## 2018-05-22 DIAGNOSIS — N186 End stage renal disease: Secondary | ICD-10-CM | POA: Diagnosis not present

## 2018-05-22 NOTE — Telephone Encounter (Signed)
Patient was offered an earlier appt for cardiac clearance with Dr. Donivan Scull office and refused but he also no showed for his original appt on 05/09/18. He has not been rescheduled.

## 2018-05-23 DIAGNOSIS — D509 Iron deficiency anemia, unspecified: Secondary | ICD-10-CM | POA: Diagnosis not present

## 2018-05-23 DIAGNOSIS — N186 End stage renal disease: Secondary | ICD-10-CM | POA: Diagnosis not present

## 2018-05-23 DIAGNOSIS — Z992 Dependence on renal dialysis: Secondary | ICD-10-CM | POA: Diagnosis not present

## 2018-05-23 DIAGNOSIS — D631 Anemia in chronic kidney disease: Secondary | ICD-10-CM | POA: Diagnosis not present

## 2018-05-24 ENCOUNTER — Ambulatory Visit: Payer: Medicare Other | Admitting: Family Medicine

## 2018-05-24 ENCOUNTER — Encounter: Payer: Self-pay | Admitting: Family Medicine

## 2018-05-24 ENCOUNTER — Ambulatory Visit (INDEPENDENT_AMBULATORY_CARE_PROVIDER_SITE_OTHER): Payer: Medicare Other | Admitting: Family Medicine

## 2018-05-24 VITALS — BP 134/80 | HR 88 | Ht 69.0 in | Wt 248.0 lb

## 2018-05-24 DIAGNOSIS — N186 End stage renal disease: Secondary | ICD-10-CM | POA: Diagnosis not present

## 2018-05-24 DIAGNOSIS — N481 Balanitis: Secondary | ICD-10-CM

## 2018-05-24 DIAGNOSIS — D509 Iron deficiency anemia, unspecified: Secondary | ICD-10-CM | POA: Diagnosis not present

## 2018-05-24 DIAGNOSIS — D631 Anemia in chronic kidney disease: Secondary | ICD-10-CM | POA: Diagnosis not present

## 2018-05-24 DIAGNOSIS — F321 Major depressive disorder, single episode, moderate: Secondary | ICD-10-CM | POA: Diagnosis not present

## 2018-05-24 DIAGNOSIS — Z992 Dependence on renal dialysis: Secondary | ICD-10-CM | POA: Diagnosis not present

## 2018-05-24 MED ORDER — SERTRALINE HCL 25 MG PO TABS: 25.0000 mg | ORAL_TABLET | Freq: Every day | ORAL | 0 refills | Status: DC

## 2018-05-24 MED ORDER — CEPHALEXIN 500 MG PO CAPS: 500.0000 mg | ORAL_CAPSULE | Freq: Two times a day (BID) | ORAL | 0 refills | Status: DC

## 2018-05-24 MED ORDER — FLUCONAZOLE 150 MG PO TABS: 150.0000 mg | ORAL_TABLET | Freq: Once | ORAL | 0 refills | Status: AC

## 2018-05-24 NOTE — Progress Notes (Signed)
Date:  05/24/2018   Name:  Jake Gomez   DOB:  Aug 25, 1970   MRN:  [id number]   Chief Complaint: Depression and Penis Pain (Only a little discharge but feeling better. Not as bad as was. Stopped abx after 8 days because it made him "feel funny." Googled medication and noticed effects- thinks this applies to him. Lost appetite, and not sleeping. ) Depression       The patient presents with depression.(Decreased swelling/erythema, and tenderness)  This is a new problem.  The current episode started more than 1 year ago.   The onset quality is gradual.   The problem has been gradually improving since onset.  Associated symptoms include decreased concentration, fatigue, helplessness, hopelessness, insomnia, irritable, decreased interest and sad.  Associated symptoms include no restlessness, no appetite change, no body aches, no myalgias, no headaches, no indigestion and no suicidal ideas.  Past treatments include nothing.  Compliance with treatment is good.  Previous treatment provided mild relief.  Past medical history includes depression.   Penis Pain  The patient's primary symptoms include genital itching, penile discharge and penile pain. The patient's pertinent negatives include no genital injury, genital lesions, pelvic pain, priapism, scrotal swelling or testicular pain. Primary symptoms comment: decreased swelling/erythema, and tenderness. This is a recurrent problem. The current episode started 1 to 4 weeks ago. The problem occurs constantly. The problem has been gradually improving. The pain is mild. Associated symptoms include dysuria and frequency. Pertinent negatives include no abdominal pain, chest pain, chills, constipation, coughing, diarrhea, discolored urine, fever, headaches, nausea, rash, shortness of breath, sore throat or urgency.     Review of Systems  Constitutional: Positive for fatigue. Negative for appetite change, chills and fever.  HENT: Negative for drooling, ear  discharge, ear pain and sore throat.   Respiratory: Negative for cough, shortness of breath and wheezing.   Cardiovascular: Negative for chest pain, palpitations and leg swelling.  Gastrointestinal: Negative for abdominal pain, blood in stool, constipation, diarrhea and nausea.  Endocrine: Negative for polydipsia.  Genitourinary: Positive for discharge, dysuria, frequency and penile pain. Negative for hematuria, pelvic pain, scrotal swelling, testicular pain and urgency.  Musculoskeletal: Negative for back pain, myalgias and neck pain.  Skin: Negative for rash.  Allergic/Immunologic: Negative for environmental allergies.  Neurological: Negative for dizziness and headaches.  Hematological: Does not bruise/bleed easily.  Psychiatric/Behavioral: Positive for decreased concentration and depression. Negative for suicidal ideas. The patient has insomnia. The patient is not nervous/anxious.     Patient Active Problem List   Diagnosis Date Noted  . Intractable abdominal pain 05/02/2018  . Peritonitis ([place]) 05/02/2018  . Chest pain 05/26/2017  . ESRD on peritoneal dialysis ([place]) 05/26/2017  . Chronic renal disease, stage 4, severely decreased glomerular filtration rate between 15-29 mL/min/1.73 square meter ([place]) 01/30/2014  . Acute-on-chronic renal failure ([place]) 09/29/2013  . Diabetes mellitus ([place]) 09/29/2013  . Hyperkalemia, diminished renal excretion 09/29/2013  . Hypertension 09/29/2013    No Known Allergies  Past Surgical History:  Procedure Laterality Date  . peritonal dialysis      Social History   Tobacco Use  . Smoking status: Former Research scientist (life sciences)  . Smokeless tobacco: Former Systems developer    Quit date: 07/16/2009  Substance Use Topics  . Alcohol use: No    Frequency: Never  . Drug use: No     Medication list has been reviewed and updated.  Current Meds  Medication Sig  . acetaminophen (TYLENOL) 500 MG tablet Take 500 mg by mouth  every 6 (six) hours as needed for mild pain.   Marland Kitchen  atorvastatin (LIPITOR) 80 MG tablet Take 1 tablet (80 mg total) by mouth every morning.  . bumetanide (BUMEX) 2 MG tablet Take 2 mg by mouth daily.   . calcium acetate (PHOSLO) 667 MG capsule Take 2,001 mg by mouth 3 (three) times daily with meals.   . carvedilol (COREG) 6.25 MG tablet Take 6.25 mg by mouth daily.  . cefTAZidime 1,500 mg in dialysis solution 2.5% low-MG/low-CA 394 MOSM/L 3,000 mL Take IV along with Dialysis exchange solution as advised already.  . docusate sodium (COLACE) 100 MG capsule Take 1 capsule (100 mg total) by mouth 2 (two) times daily.  . fluticasone (FLONASE) 50 MCG/ACT nasal spray Place 2 sprays into both nostrils daily.  . folic acid (FOLVITE) 1 MG tablet Take 1 tablet (1 mg total) by mouth daily.  . insulin regular (NOVOLIN R RELION) 100 units/mL injection Inject 0.02 mLs (2 Units total) into the skin daily. (Patient taking differently: Inject subcutaneously daily as directed on the following sliding scale: 2 units for blood glucose reading equal to or greater than 150 and 1 unit for each blood glucose reading of 50.)  . Insulin Syringes, Disposable, U-100 1 ML MISC 1 each by Does not apply route daily.  . irbesartan (AVAPRO) 300 MG tablet Take 300 mg by mouth at bedtime.   Marland Kitchen oxyCODONE-acetaminophen (PERCOCET) 5-325 MG tablet Take 1 tablet by mouth every 6 (six) hours as needed for severe pain.  . polyethylene glycol (MIRALAX / GLYCOLAX) packet Take 17 g by mouth daily.  Marland Kitchen RELION INSULIN SYRINGE 1ML/31G 31G X 5/16" 1 ML MISC 1 EACH BY DOSE NOT APPLY ROUTE DAILY  . traMADol (ULTRAM) 50 MG tablet Take 1 tablet (50 mg total) by mouth 3 (three) times daily as needed for moderate pain.  . traZODone (DESYREL) 50 MG tablet Take 50 mg by mouth at bedtime. Dr Candiss Norse  . vitamin B-12 1000 MCG tablet Take 1 tablet (1,000 mcg total) by mouth daily.    PHQ 2/9 Scores 05/24/2018 08/05/2017  PHQ - 2 Score 2 0  PHQ- 9 Score 12 0    Physical Exam  Constitutional: He is oriented to  person, place, and time. He is irritable.  HENT:  Head: Normocephalic.  Right Ear: External ear normal.  Left Ear: External ear normal.  Nose: Nose normal.  Mouth/Throat: Oropharynx is clear and moist.  Eyes: Pupils are equal, round, and reactive to light. Conjunctivae and EOM are normal. Right eye exhibits no discharge. Left eye exhibits no discharge. No scleral icterus.  Neck: Normal range of motion. Neck supple. No JVD present. No tracheal deviation present. No thyromegaly present.  Cardiovascular: Normal rate, regular rhythm, normal heart sounds and intact distal pulses. Exam reveals no gallop and no friction rub.  No murmur heard. Pulmonary/Chest: Breath sounds normal. No respiratory distress. He has no wheezes. He has no rales.  Abdominal: Soft. Bowel sounds are normal. He exhibits no mass. There is no hepatosplenomegaly. There is no tenderness. There is no rebound, no guarding and no CVA tenderness.  Musculoskeletal: Normal range of motion. He exhibits no edema or tenderness.  Lymphadenopathy:    He has no cervical adenopathy.  Neurological: He is alert and oriented to person, place, and time. He has normal strength and normal reflexes. No cranial nerve deficit.  Skin: Skin is warm. No rash noted.  Nursing note and vitals reviewed.   BP 134/80 (BP Location: Right Arm, Patient  Position: Sitting, Cuff Size: Normal)   Pulse 88   Ht 5\' 9"  (1.753 m)   Wt 248 lb (112.5 kg)   BMI 36.62 kg/m   Assessment and Plan:  1. Balanitis Acute.  Partially treated.   patient was unable to tolerate doxycycline so this was stopped.  Will continue with cephalexin 500 mg twice a day for 7 days.  And repeat a Diflucan x1.  Will recheck as needed - cephALEXin (KEFLEX) 500 MG capsule; Take 1 capsule (500 mg total) by mouth 2 (two) times daily.  Dispense: 14 capsule; Refill: 0 - fluconazole (DIFLUCAN) 150 MG tablet; Take 1 tablet (150 mg total) by mouth once for 1 dose.  Dispense: 1 tablet; Refill:  0  2. Current moderate episode of major depressive disorder, unspecified whether recurrent (HCC) PHQ score 12.  Given the circumstances of his chronic renal failure and diabetes patient would like to start on an antidepressant regimen.  Sertraline 25 mg 1/2 tablet a day for 10 days to go to 1 tablet and will check in 4 weeks as to status.  Continues to start low-dose given his history of chronic renal failure. - sertraline (ZOLOFT) 25 MG tablet; Take 1 tablet (25 mg total) by mouth daily. One half tablet a day for 10 days, then 1 a day  Dispense: 30 tablet; Refill: 0   Dr. [facility]  05/24/2018

## 2018-05-25 ENCOUNTER — Other Ambulatory Visit: Payer: Self-pay

## 2018-05-25 ENCOUNTER — Encounter: Payer: Self-pay | Admitting: Emergency Medicine

## 2018-05-25 ENCOUNTER — Inpatient Hospital Stay
Admission: EM | Admit: 2018-05-25 | Discharge: 2018-05-27 | DRG: 919 | Disposition: A | Discharge status: Home / Self Care | Payer: Medicare Other | Attending: Internal Medicine | Admitting: Internal Medicine

## 2018-05-25 ENCOUNTER — Emergency Department: Payer: Medicare Other

## 2018-05-25 DIAGNOSIS — R1084 Generalized abdominal pain: Secondary | ICD-10-CM | POA: Diagnosis not present

## 2018-05-25 DIAGNOSIS — N2581 Secondary hyperparathyroidism of renal origin: Secondary | ICD-10-CM | POA: Diagnosis not present

## 2018-05-25 DIAGNOSIS — Z794 Long term (current) use of insulin: Secondary | ICD-10-CM | POA: Diagnosis not present

## 2018-05-25 DIAGNOSIS — F418 Other specified anxiety disorders: Secondary | ICD-10-CM | POA: Diagnosis present

## 2018-05-25 DIAGNOSIS — R809 Proteinuria, unspecified: Secondary | ICD-10-CM

## 2018-05-25 DIAGNOSIS — Z87891 Personal history of nicotine dependence: Secondary | ICD-10-CM | POA: Diagnosis not present

## 2018-05-25 DIAGNOSIS — Z809 Family history of malignant neoplasm, unspecified: Secondary | ICD-10-CM

## 2018-05-25 DIAGNOSIS — K652 Spontaneous bacterial peritonitis: Secondary | ICD-10-CM | POA: Diagnosis not present

## 2018-05-25 DIAGNOSIS — K5903 Drug induced constipation: Secondary | ICD-10-CM | POA: Diagnosis present

## 2018-05-25 DIAGNOSIS — E1122 Type 2 diabetes mellitus with diabetic chronic kidney disease: Secondary | ICD-10-CM | POA: Diagnosis present

## 2018-05-25 DIAGNOSIS — E785 Hyperlipidemia, unspecified: Secondary | ICD-10-CM | POA: Diagnosis present

## 2018-05-25 DIAGNOSIS — E1140 Type 2 diabetes mellitus with diabetic neuropathy, unspecified: Secondary | ICD-10-CM | POA: Diagnosis present

## 2018-05-25 DIAGNOSIS — K5900 Constipation, unspecified: Secondary | ICD-10-CM | POA: Diagnosis not present

## 2018-05-25 DIAGNOSIS — Z7951 Long term (current) use of inhaled steroids: Secondary | ICD-10-CM | POA: Diagnosis not present

## 2018-05-25 DIAGNOSIS — Z992 Dependence on renal dialysis: Secondary | ICD-10-CM | POA: Diagnosis not present

## 2018-05-25 DIAGNOSIS — Z833 Family history of diabetes mellitus: Secondary | ICD-10-CM | POA: Diagnosis not present

## 2018-05-25 DIAGNOSIS — E119 Type 2 diabetes mellitus without complications: Secondary | ICD-10-CM | POA: Diagnosis not present

## 2018-05-25 DIAGNOSIS — I120 Hypertensive chronic kidney disease with stage 5 chronic kidney disease or end stage renal disease: Secondary | ICD-10-CM | POA: Diagnosis present

## 2018-05-25 DIAGNOSIS — Z8349 Family history of other endocrine, nutritional and metabolic diseases: Secondary | ICD-10-CM

## 2018-05-25 DIAGNOSIS — T8571XA Infection and inflammatory reaction due to peritoneal dialysis catheter, initial encounter: Principal | ICD-10-CM | POA: Diagnosis present

## 2018-05-25 DIAGNOSIS — Z823 Family history of stroke: Secondary | ICD-10-CM

## 2018-05-25 DIAGNOSIS — D631 Anemia in chronic kidney disease: Secondary | ICD-10-CM | POA: Diagnosis present

## 2018-05-25 DIAGNOSIS — Y828 Other medical devices associated with adverse incidents: Secondary | ICD-10-CM | POA: Diagnosis present

## 2018-05-25 DIAGNOSIS — E1129 Type 2 diabetes mellitus with other diabetic kidney complication: Secondary | ICD-10-CM

## 2018-05-25 DIAGNOSIS — E11319 Type 2 diabetes mellitus with unspecified diabetic retinopathy without macular edema: Secondary | ICD-10-CM | POA: Diagnosis present

## 2018-05-25 DIAGNOSIS — Z79899 Other long term (current) drug therapy: Secondary | ICD-10-CM

## 2018-05-25 DIAGNOSIS — N186 End stage renal disease: Secondary | ICD-10-CM | POA: Diagnosis present

## 2018-05-25 DIAGNOSIS — D509 Iron deficiency anemia, unspecified: Secondary | ICD-10-CM | POA: Diagnosis not present

## 2018-05-25 DIAGNOSIS — I10 Essential (primary) hypertension: Secondary | ICD-10-CM | POA: Diagnosis not present

## 2018-05-25 DIAGNOSIS — K659 Peritonitis, unspecified: Secondary | ICD-10-CM | POA: Diagnosis not present

## 2018-05-25 LAB — BODY FLUID CELL COUNT WITH DIFFERENTIAL
Eos, Fluid: 0 %
Lymphs, Fluid: 7 %
Monocyte-Macrophage-Serous Fluid: 43 %
Neutrophil Count, Fluid: 50 %
Total Nucleated Cell Count, Fluid: 604 cu mm

## 2018-05-25 LAB — CBC WITH DIFFERENTIAL/PLATELET
Abs Immature Granulocytes: 0.05 10*3/uL (ref 0.00–0.07)
Basophils Absolute: 0 10*3/uL (ref 0.0–0.1)
Basophils Relative: 0 %
Eosinophils Absolute: 0.1 10*3/uL (ref 0.0–0.5)
Eosinophils Relative: 2 %
HEMATOCRIT: 30.4 % — AB (ref 39.0–52.0)
Hemoglobin: 9.7 g/dL — ABNORMAL LOW (ref 13.0–17.0)
Immature Granulocytes: 1 %
Lymphocytes Relative: 15 %
Lymphs Abs: 1.1 10*3/uL (ref 0.7–4.0)
MCH: 32.2 pg (ref 26.0–34.0)
MCHC: 31.9 g/dL (ref 30.0–36.0)
MCV: 101 fL — AB (ref 80.0–100.0)
Monocytes Absolute: 0.6 10*3/uL (ref 0.1–1.0)
Monocytes Relative: 8 %
NEUTROS ABS: 5.6 10*3/uL (ref 1.7–7.7)
NRBC: 0 % (ref 0.0–0.2)
Neutrophils Relative %: 74 %
PLATELETS: 168 10*3/uL (ref 150–400)
RBC: 3.01 MIL/uL — ABNORMAL LOW (ref 4.22–5.81)
RDW: 15.5 % (ref 11.5–15.5)
WBC: 7.5 10*3/uL (ref 4.0–10.5)

## 2018-05-25 LAB — BASIC METABOLIC PANEL
ANION GAP: 11 (ref 5–15)
BUN: 48 mg/dL — ABNORMAL HIGH (ref 6–20)
CHLORIDE: 93 mmol/L — AB (ref 98–111)
CO2: 28 mmol/L (ref 22–32)
Calcium: 10 mg/dL (ref 8.9–10.3)
Creatinine, Ser: 10.07 mg/dL — ABNORMAL HIGH (ref 0.61–1.24)
GFR calc Af Amer: 6 mL/min — ABNORMAL LOW (ref >60)
GFR calc non Af Amer: 6 mL/min — ABNORMAL LOW (ref >60)
GLUCOSE: 121 mg/dL — AB (ref 70–99)
Potassium: 3.7 mmol/L (ref 3.5–5.1)
Sodium: 132 mmol/L — ABNORMAL LOW (ref 135–145)

## 2018-05-25 MED ORDER — SERTRALINE HCL 50 MG PO TABS
25.0000 mg | ORAL_TABLET | Freq: Every day | ORAL | Status: DC
  Administered 2018-05-26 – 2018-05-27 (×2): 25 mg via ORAL
  Filled 2018-05-25 (×2): qty 1

## 2018-05-25 MED ORDER — OXYCODONE-ACETAMINOPHEN 5-325 MG PO TABS
1.0000 | ORAL_TABLET | Freq: Four times a day (QID) | ORAL | Status: DC | PRN
  Administered 2018-05-25 – 2018-05-27 (×6): 1 via ORAL
  Filled 2018-05-25 (×7): qty 1

## 2018-05-25 MED ORDER — CEFTAZIDIME 200 MG/ML FOR DIALYSIS: 1500.0000 mg | INJECTION | Freq: Once | INTRAVENOUS_CENTRAL | Status: DC

## 2018-05-25 MED ORDER — SODIUM CHLORIDE 0.9 % IV SOLN
1.0000 g | Freq: Once | INTRAVENOUS | Status: AC
  Administered 2018-05-25: 1 g via INTRAVENOUS
  Filled 2018-05-25: qty 1

## 2018-05-25 MED ORDER — POLYETHYLENE GLYCOL 3350 17 G PO PACK
17.0000 g | PACK | Freq: Every day | ORAL | Status: DC
  Administered 2018-05-26 – 2018-05-27 (×2): 17 g via ORAL
  Filled 2018-05-25 (×2): qty 1

## 2018-05-25 MED ORDER — FLUTICASONE PROPIONATE 50 MCG/ACT NA SUSP
2.0000 | Freq: Every day | NASAL | Status: DC
  Administered 2018-05-26 – 2018-05-27 (×2): 2 via NASAL
  Filled 2018-05-25 (×2): qty 16

## 2018-05-25 MED ORDER — VANCOMYCIN 100 MG/ML FOR DIALYSIS: 1000.0000 mg | INJECTION | Freq: Once | Status: DC

## 2018-05-25 MED ORDER — IRBESARTAN 150 MG PO TABS
300.0000 mg | ORAL_TABLET | Freq: Every day | ORAL | Status: DC
  Administered 2018-05-25 – 2018-05-26 (×2): 300 mg via ORAL
  Filled 2018-05-25 (×2): qty 2

## 2018-05-25 MED ORDER — ONDANSETRON HCL 4 MG PO TABS: 4.0000 mg | ORAL_TABLET | Freq: Four times a day (QID) | ORAL | Status: DC | PRN

## 2018-05-25 MED ORDER — INSULIN ASPART 100 UNIT/ML ~~LOC~~ SOLN
0.0000 [IU] | Freq: Three times a day (TID) | SUBCUTANEOUS | Status: DC
  Administered 2018-05-26: 2 [IU] via SUBCUTANEOUS
  Administered 2018-05-27: 1 [IU] via SUBCUTANEOUS
  Administered 2018-05-27: 2 [IU] via SUBCUTANEOUS
  Filled 2018-05-25 (×3): qty 1

## 2018-05-25 MED ORDER — VANCOMYCIN 100 MG/ML FOR DIALYSIS
Freq: Once | Status: AC
  Administered 2018-05-25: 22:00:00 via INTRAPERITONEAL
  Filled 2018-05-25: qty 0

## 2018-05-25 MED ORDER — INSULIN ASPART 100 UNIT/ML ~~LOC~~ SOLN: 0.0000 [IU] | Freq: Every day | SUBCUTANEOUS | Status: DC

## 2018-05-25 MED ORDER — DOCUSATE SODIUM 100 MG PO CAPS
100.0000 mg | ORAL_CAPSULE | Freq: Two times a day (BID) | ORAL | Status: DC
  Administered 2018-05-25 – 2018-05-27 (×4): 100 mg via ORAL
  Filled 2018-05-25 (×4): qty 1

## 2018-05-25 MED ORDER — HEPARIN SODIUM (PORCINE) 5000 UNIT/ML IJ SOLN
5000.0000 [IU] | Freq: Three times a day (TID) | INTRAMUSCULAR | Status: DC
  Administered 2018-05-25 – 2018-05-27 (×5): 5000 [IU] via SUBCUTANEOUS
  Filled 2018-05-25 (×5): qty 1

## 2018-05-25 MED ORDER — ONDANSETRON HCL 4 MG/2ML IJ SOLN
4.0000 mg | Freq: Four times a day (QID) | INTRAMUSCULAR | Status: DC | PRN
  Administered 2018-05-26: 4 mg via INTRAVENOUS
  Filled 2018-05-25: qty 2

## 2018-05-25 MED ORDER — TRAZODONE HCL 50 MG PO TABS
50.0000 mg | ORAL_TABLET | Freq: Every day | ORAL | Status: DC
  Filled 2018-05-25 (×2): qty 1

## 2018-05-25 MED ORDER — BUMETANIDE 1 MG PO TABS
2.0000 mg | ORAL_TABLET | Freq: Every day | ORAL | Status: DC
  Administered 2018-05-26 – 2018-05-27 (×2): 2 mg via ORAL
  Filled 2018-05-25 (×2): qty 2

## 2018-05-25 MED ORDER — DEXTROSE 5 % IV SOLN
500.0000 mg | INTRAVENOUS | Status: DC
  Filled 2018-05-25: qty 0.5

## 2018-05-25 MED ORDER — FOLIC ACID 1 MG PO TABS
1.0000 mg | ORAL_TABLET | Freq: Every day | ORAL | Status: DC
  Administered 2018-05-25 – 2018-05-26 (×2): 1 mg via ORAL
  Filled 2018-05-25 (×2): qty 1

## 2018-05-25 MED ORDER — CALCIUM ACETATE (PHOS BINDER) 667 MG PO CAPS
2001.0000 mg | ORAL_CAPSULE | Freq: Three times a day (TID) | ORAL | Status: DC
  Administered 2018-05-26 – 2018-05-27 (×4): 2001 mg via ORAL
  Filled 2018-05-25 (×5): qty 3

## 2018-05-25 MED ORDER — CARVEDILOL 6.25 MG PO TABS
6.2500 mg | ORAL_TABLET | Freq: Every day | ORAL | Status: DC
  Administered 2018-05-25 – 2018-05-27 (×3): 6.25 mg via ORAL
  Filled 2018-05-25 (×2): qty 1

## 2018-05-25 MED ORDER — VITAMIN B-12 1000 MCG PO TABS
1000.0000 ug | ORAL_TABLET | Freq: Every day | ORAL | Status: DC
  Administered 2018-05-26 – 2018-05-27 (×2): 1000 ug via ORAL
  Filled 2018-05-25 (×2): qty 1

## 2018-05-25 MED ORDER — ACETAMINOPHEN 650 MG RE SUPP: 650.0000 mg | Freq: Four times a day (QID) | RECTAL | Status: DC | PRN

## 2018-05-25 MED ORDER — CARVEDILOL 6.25 MG PO TABS
ORAL_TABLET | ORAL | Status: AC
  Administered 2018-05-25: 6.25 mg via ORAL
  Filled 2018-05-25: qty 1

## 2018-05-25 MED ORDER — HEPARIN 1000 UNIT/ML FOR PERITONEAL DIALYSIS
1500.0000 [IU] | INTRAMUSCULAR | Status: DC | PRN
  Filled 2018-05-25: qty 1.5

## 2018-05-25 MED ORDER — ACETAMINOPHEN 325 MG PO TABS: 650.0000 mg | ORAL_TABLET | Freq: Four times a day (QID) | ORAL | Status: DC | PRN

## 2018-05-25 MED ORDER — GENTAMICIN SULFATE 0.1 % EX CREA
1.0000 "application " | TOPICAL_CREAM | Freq: Every day | CUTANEOUS | Status: DC
  Administered 2018-05-25 – 2018-05-27 (×4): 1 via TOPICAL
  Filled 2018-05-25: qty 15

## 2018-05-25 MED ORDER — ATORVASTATIN CALCIUM 20 MG PO TABS
80.0000 mg | ORAL_TABLET | Freq: Every morning | ORAL | Status: DC
  Administered 2018-05-26 – 2018-05-27 (×2): 80 mg via ORAL
  Filled 2018-05-25 (×2): qty 4

## 2018-05-25 MED ORDER — SODIUM CHLORIDE 0.9 % IV SOLN
1.0000 g | Freq: Once | INTRAVENOUS | Status: DC
  Filled 2018-05-25: qty 1

## 2018-05-25 NOTE — ED Notes (Signed)
Patient transported to X-ray 

## 2018-05-25 NOTE — ED Triage Notes (Signed)
Pt here with c/o constipation, no bp in about 4 days, does PD at home and unable to drain any off last night, does take Myralax daily with no relief at this time. NAD.

## 2018-05-25 NOTE — ED Notes (Signed)
Dialysis nurse with pt for procedure

## 2018-05-25 NOTE — H&P (Signed)
[facility] NAME: Jake Gomez    MR#:  [id number]  DATE OF BIRTH:  [birth year]  DATE OF ADMISSION:  05/25/2018  PRIMARY CARE PHYSICIAN: Juline Patch, MD   REQUESTING/REFERRING PHYSICIAN: Dr Merlyn Lot  CHIEF COMPLAINT:   Chief Complaint  Patient presents with  . Constipation    HISTORY OF PRESENT ILLNESS:  Jake Gomez  is a 47 y.o. male with a previous history of Pseudomonas SBP.  He recently had treatment started in a few weeks back and finished 3 days ago.  He was feeling better with regards to his abdominal pain.  Last night he had abdominal pain that was pretty severe.  He is also having some constipation but did have a bowel movement in the ER.  Yesterday afternoon he tried to do a manual exchange but took longer than usual.  Last night out of the 4 cycles it turned off in the second cycle and he could not do it anymore.  The dialysis nurse was able to pull out a clot out of the catheter in the ER.  Fluid was sent to the lab and turns out that he is positive for SBP with a lot of white blood cells in his peritoneal fluid.  Hospitalist services were contacted for further evaluation.  Abdominal pain is generalized throughout the entire abdomen.  PAST MEDICAL HISTORY:   Past Medical History:  Diagnosis Date  . Diabetes mellitus without complication ([facility])   . Hypertension   . Renal disorder     PAST SURGICAL HISTORY:   Past Surgical History:  Procedure Laterality Date  . peritonal dialysis      SOCIAL HISTORY:   Social History   Tobacco Use  . Smoking status: Former Research scientist (life sciences)  . Smokeless tobacco: Former Systems developer    Quit date: 07/16/2009  Substance Use Topics  . Alcohol use: No    Frequency: Never    FAMILY HISTORY:   Family History  Problem Relation Age of Onset  . Thyroid disease Mother   . Stroke Father   . Cancer Father   . Diabetes Father     DRUG ALLERGIES:  No Known Allergies  REVIEW  OF SYSTEMS:  CONSTITUTIONAL: No fever, feels cold.  Positive for weight loss.  Positive for fatigue.  EYES: Some blurred vision. EARS, NOSE, AND THROAT: No tinnitus or ear pain. No sore throat RESPIRATORY: No cough, shortness of breath, wheezing or hemoptysis.  CARDIOVASCULAR: No chest pain, orthopnea, edema.  GASTROINTESTINAL: No nausea, vomiting, diarrhea.  Positive for abdominal pain.  Positive for constipation.  No blood in bowel movements ENDOCRINE: No polyuria, nocturia,  HEMATOLOGY: No anemia, easy bruising or bleeding SKIN: No rash or lesion.  Positive itching MUSCULOSKELETAL: No joint pain or arthritis.   NEUROLOGIC: No tingling, numbness, weakness.  PSYCHIATRY: Some anxiety and depression.   MEDICATIONS AT HOME:   Prior to Admission medications   Medication Sig Start Date End Date Taking? Authorizing Provider  atorvastatin (LIPITOR) 80 MG tablet Take 1 tablet (80 mg total) by mouth every morning. 05/15/18  Yes Juline Patch, MD  calcium acetate (PHOSLO) 667 MG capsule Take 2,001 mg by mouth 3 (three) times daily with meals.  05/27/17  Yes [provider]  carvedilol (COREG) 6.25 MG tablet Take 6.25 mg by mouth daily. 02/28/18  Yes [provider]  cefTAZidime 1,500 mg in dialysis solution 2.5% low-MG/low-CA 394 MOSM/L 3,000 mL Take IV along with Dialysis exchange solution as advised  already. 05/05/18  Yes Vaughan Basta, MD  cephALEXin (KEFLEX) 500 MG capsule Take 1 capsule (500 mg total) by mouth 2 (two) times daily. 05/24/18  Yes Juline Patch, MD  docusate sodium (COLACE) 100 MG capsule Take 1 capsule (100 mg total) by mouth 2 (two) times daily. 05/05/18  Yes Vaughan Basta, MD  fluticasone (FLONASE) 50 MCG/ACT nasal spray Place 2 sprays into both nostrils daily. 05/05/18  Yes Vaughan Basta, MD  folic acid (FOLVITE) 1 MG tablet Take 1 tablet (1 mg total) by mouth daily. 05/06/18  Yes Vaughan Basta, MD  glipiZIDE (GLUCOTROL)  5 MG tablet Take 1 tablet (5 mg total) by mouth 2 (two) times daily before a meal. 02/14/18  Yes Juline Patch, MD  insulin regular (NOVOLIN R RELION) 100 units/mL injection Inject 0.02 mLs (2 Units total) into the skin daily. Patient taking differently: Inject subcutaneously daily as directed on the following sliding scale: 2 units for blood glucose reading equal to or greater than 150 and 1 unit for each blood glucose reading of 50. 02/14/18  Yes Juline Patch, MD  Insulin Syringes, Disposable, U-100 1 ML MISC 1 each by Does not apply route daily. 12/29/17  Yes Juline Patch, MD  irbesartan (AVAPRO) 300 MG tablet Take 300 mg by mouth at bedtime.  05/14/17  Yes [provider]  polyethylene glycol (MIRALAX / GLYCOLAX) packet Take 17 g by mouth daily. 05/06/18  Yes Vaughan Basta, MD  sertraline (ZOLOFT) 25 MG tablet Take 1 tablet (25 mg total) by mouth daily. One half tablet a day for 10 days, then 1 a day 05/24/18  Yes Juline Patch, MD  traZODone (DESYREL) 50 MG tablet Take 50 mg by mouth at bedtime. Dr Candiss Norse 01/09/18  Yes [provider]  vitamin B-12 1000 MCG tablet Take 1 tablet (1,000 mcg total) by mouth daily. 05/06/18  Yes Vaughan Basta, MD  acetaminophen (TYLENOL) 500 MG tablet Take 500 mg by mouth every 6 (six) hours as needed for mild pain.     [provider]  bumetanide (BUMEX) 2 MG tablet Take 2 mg by mouth daily.  02/16/17   [provider]  oxyCODONE-acetaminophen (PERCOCET) 5-325 MG tablet Take 1 tablet by mouth every 6 (six) hours as needed for severe pain. Patient not taking: Reported on 05/25/2018 05/05/18 05/05/19  Vaughan Basta, MD  RELION INSULIN SYRINGE 1ML/31G 31G X 5/16" 1 ML MISC 1 EACH BY DOSE NOT APPLY ROUTE DAILY 12/29/17   [provider]      VITAL SIGNS:  Blood pressure (!) 184/87, pulse 76, temperature 98 F (36.7 C), temperature source Oral, resp. rate 19, height 5\' 8"  (1.727 m), weight 110.2  kg, SpO2 95 %.  PHYSICAL EXAMINATION:  GENERAL:  47 y.o.-year-old patient lying in the bed with no acute distress.  EYES: Pupils equal, round, reactive to light and accommodation. No scleral icterus. Extraocular muscles intact.  HEENT: Head atraumatic, normocephalic. Oropharynx and nasopharynx clear.  NECK:  Supple, no jugular venous distention. No thyroid enlargement, no tenderness.  LUNGS: Normal breath sounds bilaterally, no wheezing, rales,rhonchi or crepitation. No use of accessory muscles of respiration.  CARDIOVASCULAR: S1, S2 normal. No murmurs, rubs, or gallops.  ABDOMEN: Soft, diffuse abdominal tenderness, nondistended. Bowel sounds present. No organomegaly or mass.  EXTREMITIES: No pedal edema, cyanosis, or clubbing.  NEUROLOGIC: Cranial nerves II through XII are intact. Muscle strength 5/5 in all extremities. Sensation intact. Gait not checked.  PSYCHIATRIC: The patient is alert and oriented  x 3.  SKIN: No rash, lesion, or ulcer.   LABORATORY PANEL:   CBC Recent Labs  Lab 05/25/18 1119  WBC 7.5  HGB 9.7*  HCT 30.4*  PLT 168   ------------------------------------------------------------------------------------------------------------------  Chemistries  Recent Labs  Lab 05/25/18 1119  NA 132*  K 3.7  CL 93*  CO2 28  GLUCOSE 121*  BUN 48*  CREATININE 10.07*  CALCIUM 10.0   ------------------------------------------------------------------------------------------------------------------   RADIOLOGY:  Dg Abdomen Acute W/chest  Result Date: 05/25/2018 CLINICAL DATA:  Constipation. EXAM: DG ABDOMEN ACUTE W/ 1V CHEST COMPARISON:  08/05/2017 chest x-ray. FINDINGS: Low lung volumes. Mild cardiomegaly. Linear areas of atelectasis or scarring in the lungs bilaterally. Blunting of left costophrenic angle could reflect small left effusion or pleural thickening. Nonobstructive bowel gas pattern. No significant increase in stool burden. No organomegaly or free air.  Peritoneal dialysis catheter present in the pelvis. IMPRESSION: No evidence of bowel obstruction or free air. Low lung volumes with linear areas of scarring or atelectasis throughout both lungs. Small left pleural effusion versus pleural thickening. Electronically Signed   By: Rolm Baptise M.D.   On: 05/25/2018 09:29    IMPRESSION AND PLAN:   1.  Spontaneous bacterial peritonitis.  White blood cell 604 out of the fluid.  Follow-up culture.  Previously had Pseudomonas in his culture at [facility].  Give systemic ceftaz edema.  ID consultation.  Case discussed with nephrology and he will order intraperitoneal antibiotics. 2.  Peritoneal dialysis as per nephrology. 3.  Hypertension restart oral medications 4.  Type 2 diabetes mellitus put on sliding scale.  Hold glipizide for right now 5.  Anxiety depression continue psychiatric medication 6.  Anemia of chronic disease  All the records are reviewed and case discussed with ED provider. Management plans discussed with the patient, family and they are in agreement.  CODE STATUS: Full code  TOTAL TIME TAKING CARE OF THIS PATIENT: 50 minutes.    Loletha Grayer M.D on 05/25/2018 at 7:13 PM  Between 7am to 6pm - Pager - [phone]  After 6pm call admission pager [phone]  [facility]  [phone]  CC: Primary care physician; Juline Patch, MD

## 2018-05-25 NOTE — ED Notes (Signed)
Coreg ordered - pt eating a dinner tray then will be medicated

## 2018-05-25 NOTE — ED Notes (Signed)
Pt unhooked himself off the monitor. States he will leave and do the dialysis at home like he normally does.

## 2018-05-25 NOTE — ED Notes (Signed)
Now pt is staying again. Let er doc know we took out his iv when he stated he was leaving. Apparently dialysis dr came and spoke with pt and he is going to send his nurse down to drain him.

## 2018-05-25 NOTE — Progress Notes (Signed)
Pre [facility]CP[facility] Assessment    05/25/18 2130  Neurological  Level of Consciousness Alert  Orientation Level Oriented X4  Respiratory  Respiratory Pattern Regular;Unlabored  Chest Assessment Chest expansion symmetrical  Bilateral Breath Sounds Clear;Diminished  Cough None  Cardiac  Pulse Regular  Heart Sounds S1, S2  ECG Monitor No  Vascular  R Radial Pulse +2  L Radial Pulse +2  Peritoneal Catheter   Placement Date: 05/08/15   Catheter Location: (c)   Site Assessment Clean;Dry;Intact  Drainage Description None  Catheter status Accessed;Patent  Dressing Gauze/Drain sponge  Dressing Status Clean;Dry;Intact  Dressing Intervention Dressing changed  Psychosocial  Psychosocial (WDL) WDL

## 2018-05-25 NOTE — ED Notes (Signed)
Transporting patient to room 214-2C

## 2018-05-25 NOTE — ED Notes (Signed)
Still awaiting dialysis nurse to come down. Pt is sleeping soundly with family at bedside

## 2018-05-25 NOTE — Progress Notes (Signed)
Pharmacy Antibiotic Note  Jake Gomez is a 47 y.o. male admitted on 05/25/2018 with SBP.  Pharmacy has been consulted for Ceftazidime dosing. Patient is ESRD on PD. Just completed 4 weeks of Ceftazidime and Levaquin for peritonitis with Pseudomonas aeruginosa.   Plan: Ceftazidime 1g IV once then 500mg  IV q24h.  Height: 5\' 8"  (172.7 cm) Weight: 243 lb (110.2 kg) IBW/kg (Calculated) : 68.4  Temp (24hrs), Avg:98 F (36.7 C), Min:98 F (36.7 C), Max:98 F (36.7 C)  Recent Labs  Lab 05/25/18 1119  WBC 7.5  CREATININE 10.07*    Estimated Creatinine Clearance: 11.2 mL/min (A) (by C-G formula based on SCr of 10.07 mg/dL (H)).    No Known Allergies  Antimicrobials this admission: Ceftazidime 12/12 >>   Thank you for allowing pharmacy to be a part of this patient's care.  Paulina Fusi, PharmD, BCPS 05/25/2018 7:46 PM

## 2018-05-25 NOTE — Progress Notes (Signed)
[facility]  ROUNDING NOTE   Subjective:   [name] presents to the ED with abdominal pain and constipation. Was unable to get full peritoneal dialysis last night.   Patient found to have wbc in peritoneal fluid.   Patient just completed 4 weeks of ceftazidime 1.5g  and levofloxacin 500mg  qOD.   Objective:  Vital signs in last 24 hours:  Temp:  [98 F (36.7 C)] 98 F (36.7 C) (12/12 0835) Pulse Rate:  [73-90] 76 (12/12 1412) Resp:  [17-32] 19 (12/12 1000) BP: (163-196)/(66-87) 184/87 (12/12 1600) SpO2:  [95 %-100 %] 95 % (12/12 1412) Weight:  [110.2 kg] 110.2 kg (12/12 0836)  Weight change:  Filed Weights   05/25/18 0836  Weight: 110.2 kg    Intake/Output: No intake/output data recorded.   Intake/Output this shift:  No intake/output data recorded.  Physical Exam: General: NAD,   Head: Normocephalic, atraumatic. Moist oral mucosal membranes  Eyes: Anicteric, PERRL  Neck: Supple, trachea midline  Lungs:  Clear to auscultation  Heart: Regular rate and rhythm  Abdomen:  +tender to palpation  Extremities: no peripheral edema.  Neurologic: Nonfocal, moving all four extremities  Skin: No lesions  Access: PD catheter    Basic Metabolic Panel: Recent Labs  Lab 05/25/18 1119  NA 132*  K 3.7  CL 93*  CO2 28  GLUCOSE 121*  BUN 48*  CREATININE 10.07*  CALCIUM 10.0    Liver Function Tests: No results for input(s): AST, ALT, ALKPHOS, BILITOT, PROT, ALBUMIN in the last 168 hours. No results for input(s): LIPASE, AMYLASE in the last 168 hours. No results for input(s): AMMONIA in the last 168 hours.  CBC: Recent Labs  Lab 05/25/18 1119  WBC 7.5  NEUTROABS 5.6  HGB 9.7*  HCT 30.4*  MCV 101.0*  PLT 168    Cardiac Enzymes: No results for input(s): CKTOTAL, CKMB, CKMBINDEX, TROPONINI in the last 168 hours.  BNP: Invalid input(s): POCBNP  CBG: No results for input(s): GLUCAP in the last 168 hours.  Microbiology: Results for  orders placed or performed during the hospital encounter of 05/01/18  MRSA PCR Screening     Status: None   Collection Time: 05/02/18  8:21 AM  Result Value Ref Range Status   MRSA by PCR NEGATIVE NEGATIVE Final    Comment:        The GeneXpert MRSA Assay (FDA approved for NASAL specimens only), is one component of a comprehensive MRSA colonization surveillance program. It is not intended to diagnose MRSA infection nor to guide or monitor treatment for MRSA infections. Performed at [facility], [address], [place], [place] [address]   Body fluid culture     Status: None   Collection Time: 05/02/18  4:09 PM  Result Value Ref Range Status   Specimen Description   Final    PLEURAL Performed at [facility], [address] [place], [place] [address]    Special Requests   Final    NONE Performed at [facility], [address] [place], [place] [address]    Gram Stain   Final    FEW WBC PRESENT,BOTH PMN AND MONONUCLEAR NO ORGANISMS SEEN    Culture   Final    NO GROWTH 3 DAYS Performed at [facility], [place] [address] [place], [place] [address]    Report Status 05/06/2018 FINAL  Final    Coagulation Studies: No results for input(s): LABPROT, INR in the last 72 hours.  Urinalysis:  No results for input(s): COLORURINE, LABSPEC, [place], GLUCOSEU, HGBUR, BILIRUBINUR, KETONESUR, PROTEINUR, UROBILINOGEN, NITRITE, LEUKOCYTESUR in the last 72 hours.  Invalid input(s): APPERANCEUR    Imaging: Dg Abdomen Acute W/chest  Result Date: 05/25/2018 CLINICAL DATA:  Constipation. EXAM: DG ABDOMEN ACUTE W/ 1V CHEST COMPARISON:  08/05/2017 chest x-ray. FINDINGS: Low lung volumes. Mild cardiomegaly. Linear areas of atelectasis or scarring in the lungs bilaterally. Blunting of left costophrenic angle could reflect small left effusion or pleural thickening. Nonobstructive bowel gas pattern. No significant increase in stool burden. No organomegaly or  free air. Peritoneal dialysis catheter present in the pelvis. IMPRESSION: No evidence of bowel obstruction or free air. Low lung volumes with linear areas of scarring or atelectasis throughout both lungs. Small left pleural effusion versus pleural thickening. Electronically Signed   By: Rolm Baptise M.D.   On: 05/25/2018 09:29     Medications:   . cefTAZidime (FORTAZ)  IV        Assessment/ Plan:  [name] is a 47 y.o. Hispanic male with end stage renal disease on peritoneal dialysis, hypertension, insulin dependent diabetes mellitus type II, diabetic neuropathy, diabetic retinopathy, hyperlipidemia  [facility] PD EDW 111kg.  CCPD 10 hours 3 liter fills 4 exchanges with last fill 2.5 liters.   1. End Stage Renal Disease: on peritoneal dialysis - PD orders prepared.   2. Peritonitis: pseudomonas aeruginosa on 11/13 at [facility].  Cell count of 604 today Completed ceftaxidime 1.5g IP daily and levofloxacin 500mg  every other day. Completed course however with recurrence versus failure of outpatient therapy.  - Consult ID - Broad spectrum abx.  3. Hypertension: blood pressure at goal.  - amlodipine, carvedilol, irbestartan, labetalol  4. Anemia of chronic kidney disease:  Hemoglobin 9.7. Macrocytopenia - EPO as outpatient. Last dose was 12/06  5. Secondary Hyperparathyroidism: labs on 05/16/09: Phos 4.4,, Calcium 10.9 - elevated. PTH low at 97.  - continue calcium acetate with meals. Monitor phos and calcium levels.    LOS: 0 Jake Gomez 12/12/20196:23 PM

## 2018-05-25 NOTE — ED Notes (Signed)
At pt's request called dialysis to see when they would be draining the pt - he was added to their list and it will "be a really long time". Advised pt of this. Pt was previously offered to be discharged home to drain it himself. Pt is aware of this option and will let me know what he decides to do.

## 2018-05-25 NOTE — ED Provider Notes (Addendum)
[facility] Emergency Department Provider Note  ____________________________________________   I have reviewed the triage vital signs and the nursing notes. Where available I have reviewed prior notes and, if possible and indicated, outside hospital notes.    HISTORY  Chief Complaint Constipation    HPI Jake Gomez is a 47 y.o. male with a long complicated history of medical problems including diabetes mellitus hypertension, peritoneal dialysis, he states he is having no pain but has been constipated for the last couple days.  Denies any fever or chills.  This is unusual for him.  He has tried home remedies including MiraLAX without success.  He denies any melena, he has no obstructive symptoms such as vomiting, he states he just does not feel that he can pass his bowels.  He has no numbness or weakness incontinence of bowel or bladder or other concerns to suggestive of an acute neurologic pathology.  He states this feels nothing like his prior bouts of SBP, as he has no fever no abdominal pain    Past Medical History:  Diagnosis Date  . Diabetes mellitus without complication ([place])   . Hypertension   . Renal disorder     Patient Active Problem List   Diagnosis Date Noted  . Intractable abdominal pain 05/02/2018  . Peritonitis ([place]) 05/02/2018  . Chest pain 05/26/2017  . ESRD on peritoneal dialysis ([place]) 05/26/2017  . Chronic renal disease, stage 4, severely decreased glomerular filtration rate between 15-29 mL/min/1.73 square meter ([place]) 01/30/2014  . Acute-on-chronic renal failure ([place]) 09/29/2013  . Diabetes mellitus ([place]) 09/29/2013  . Hyperkalemia, diminished renal excretion 09/29/2013  . Hypertension 09/29/2013    Past Surgical History:  Procedure Laterality Date  . peritonal dialysis      Prior to Admission medications   Medication Sig Start Date End Date Taking? Authorizing Provider  acetaminophen (TYLENOL) 500 MG tablet Take 500 mg by  mouth every 6 (six) hours as needed for mild pain.     [provider]  atorvastatin (LIPITOR) 80 MG tablet Take 1 tablet (80 mg total) by mouth every morning. 05/15/18   Juline Patch, MD  bumetanide (BUMEX) 2 MG tablet Take 2 mg by mouth daily.  02/16/17   [provider]  calcium acetate (PHOSLO) 667 MG capsule Take 2,001 mg by mouth 3 (three) times daily with meals.  05/27/17   [provider]  carvedilol (COREG) 6.25 MG tablet Take 6.25 mg by mouth daily. 02/28/18   [provider]  cefTAZidime 1,500 mg in dialysis solution 2.5% low-MG/low-CA 394 MOSM/L 3,000 mL Take IV along with Dialysis exchange solution as advised already. 05/05/18   Vaughan Basta, MD  cephALEXin (KEFLEX) 500 MG capsule Take 1 capsule (500 mg total) by mouth 2 (two) times daily. 05/24/18   Juline Patch, MD  docusate sodium (COLACE) 100 MG capsule Take 1 capsule (100 mg total) by mouth 2 (two) times daily. 05/05/18   Vaughan Basta, MD  fluticasone (FLONASE) 50 MCG/ACT nasal spray Place 2 sprays into both nostrils daily. 05/05/18   Vaughan Basta, MD  folic acid (FOLVITE) 1 MG tablet Take 1 tablet (1 mg total) by mouth daily. 05/06/18   Vaughan Basta, MD  glipiZIDE (GLUCOTROL) 5 MG tablet Take 1 tablet (5 mg total) by mouth 2 (two) times daily before a meal. 02/14/18   Juline Patch, MD  insulin regular (NOVOLIN R RELION) 100 units/mL injection Inject 0.02 mLs (2 Units total) into the skin daily. Patient taking differently:  Inject subcutaneously daily as directed on the following sliding scale: 2 units for blood glucose reading equal to or greater than 150 and 1 unit for each blood glucose reading of 50. 02/14/18   Juline Patch, MD  Insulin Syringes, Disposable, U-100 1 ML MISC 1 each by Does not apply route daily. 12/29/17   Juline Patch, MD  irbesartan (AVAPRO) 300 MG tablet Take 300 mg by mouth at bedtime.  05/14/17   [provider]   oxyCODONE-acetaminophen (PERCOCET) 5-325 MG tablet Take 1 tablet by mouth every 6 (six) hours as needed for severe pain. 05/05/18 05/05/19  Vaughan Basta, MD  polyethylene glycol (MIRALAX / GLYCOLAX) packet Take 17 g by mouth daily. 05/06/18   Vaughan Basta, MD  RELION INSULIN SYRINGE 1ML/31G 31G X 5/16" 1 ML MISC 1 EACH BY DOSE NOT APPLY ROUTE DAILY 12/29/17   [provider]  sertraline (ZOLOFT) 25 MG tablet Take 1 tablet (25 mg total) by mouth daily. One half tablet a day for 10 days, then 1 a day 05/24/18   Juline Patch, MD  traMADol (ULTRAM) 50 MG tablet Take 1 tablet (50 mg total) by mouth 3 (three) times daily as needed for moderate pain. 05/05/18   Vaughan Basta, MD  traZODone (DESYREL) 50 MG tablet Take 50 mg by mouth at bedtime. Dr Candiss Norse 01/09/18   [provider]  vitamin B-12 1000 MCG tablet Take 1 tablet (1,000 mcg total) by mouth daily. 05/06/18   Vaughan Basta, MD    Allergies Patient has no known allergies.  Family History  Problem Relation Age of Onset  . Thyroid disease Mother   . Stroke Father   . Cancer Father   . Diabetes Father     Social History Social History   Tobacco Use  . Smoking status: Former Research scientist (life sciences)  . Smokeless tobacco: Former Systems developer    Quit date: 07/16/2009  Substance Use Topics  . Alcohol use: No    Frequency: Never  . Drug use: No    Review of Systems Constitutional: No fever/chills Eyes: No visual changes. ENT: No sore throat. No stiff neck no neck pain Cardiovascular: Denies chest pain. Respiratory: Denies shortness of breath. Gastrointestinal:   no vomiting.  No diarrhea.  + constipation. Genitourinary: Negative for dysuria. Musculoskeletal: Negative lower extremity swelling Skin: Negative for rash. Neurological: Negative for severe headaches, focal weakness or numbness.   ____________________________________________   PHYSICAL EXAM:  VITAL SIGNS: ED Triage Vitals  Enc Vitals  Group     BP 05/25/18 0835 (!) 167/78     Pulse Rate 05/25/18 0835 90     Resp 05/25/18 0835 18     Temp 05/25/18 0835 98 F (36.7 C)     Temp Source 05/25/18 0835 Oral     SpO2 05/25/18 0835 99 %     Weight 05/25/18 0836 243 lb (110.2 kg)     Height 05/25/18 0836 5\' 8"  (1.727 m)     Head Circumference --      Peak Flow --      Pain Score 05/25/18 0836 9     Pain Loc --      Pain Edu? --      Excl. in [place]? --     Constitutional: Alert and oriented. Well appearing and in no acute distress. Cardiovascular: Normal rate, regular rhythm. Grossly normal heart sounds.  Good peripheral circulation. Respiratory: Normal respiratory effort.  No retractions. Lungs CTAB. Abdominal: Soft and nontender. No distention. No guarding no  rebound Back:  There is no focal tenderness or step off.  there is no midline tenderness there are no lesions noted. there is no CVA tenderness Musculoskeletal: No lower extremity tenderness, no upper extremity tenderness. No joint effusions, no DVT signs strong distal pulses no edema Neurologic:  Normal speech and language. No gross focal neurologic deficits are appreciated.  Skin:  Skin is warm, dry and intact. No rash noted. Psychiatric: Mood and affect are normal. Speech and behavior are normal.  ____________________________________________   LABS (all labs ordered are listed, but only abnormal results are displayed)  Labs Reviewed - No data to display  Pertinent labs  results that were available during my care of the patient were reviewed by me and considered in my medical decision making (see chart for details). ____________________________________________  EKG  I personally interpreted any EKGs ordered by me or triage  ____________________________________________  RADIOLOGY  Pertinent labs & imaging results that were available during my care of the patient were reviewed by me and considered in my medical decision making (see chart for details). If  possible, patient and/or family made aware of any abnormal findings.  Dg Abdomen Acute W/chest  Result Date: 05/25/2018 CLINICAL DATA:  Constipation. EXAM: DG ABDOMEN ACUTE W/ 1V CHEST COMPARISON:  08/05/2017 chest x-ray. FINDINGS: Low lung volumes. Mild cardiomegaly. Linear areas of atelectasis or scarring in the lungs bilaterally. Blunting of left costophrenic angle could reflect small left effusion or pleural thickening. Nonobstructive bowel gas pattern. No significant increase in stool burden. No organomegaly or free air. Peritoneal dialysis catheter present in the pelvis. IMPRESSION: No evidence of bowel obstruction or free air. Low lung volumes with linear areas of scarring or atelectasis throughout both lungs. Small left pleural effusion versus pleural thickening. Electronically Signed   By: Rolm Baptise M.D.   On: 05/25/2018 09:29   ____________________________________________    PROCEDURES  Procedure(s) performed: None  Procedures  Critical Care performed: None  ____________________________________________   INITIAL IMPRESSION / ASSESSMENT AND PLAN / ED COURSE  Pertinent labs & imaging results that were available during my care of the patient were reviewed by me and considered in my medical decision making (see chart for details).   Well-looking patient here for constipation I see no red flags to suggest there are other pathologies present such as obstruction, I did do an x-ray which is negative, patient made aware of all the findings there seen.  There is nothing to suggest any other acute process today we will give him an enema see if we can get him feeling better and get him home.  He declines rectal disimpaction  ----------------------------------------- 10:25 AM on 05/25/2018 -----------------------------------------  Patient had a very satisfying bowel movement, however now he explains to me, so that he did not tell me before despite direct questioning that he could not  get his peritoneal dialysis fluid out last night and he was told by the nurse that it was because he was constipated so he thought he if he came in here and took care of the constipation that he would be able to take out the fluid.  He denies any pain at this time he states is uncomfortable having the fluid in there however.  Again, this is new information he is just now providing that he did not provide before up till now he had not tried any evidence of complications or confusion about his catheter.  Patient states he is had the catheter for 3 years.  I have examined the  site it looks healthy, he has a nonsurgical abdomen is not having any fever or vomiting but he states he put his PD fluid in last night and has not been able to drain it.  He would like me to get a PD nurse down here to drain it for him if possible.  Given this we will check basic blood work make sure his potassium is not too high etc. and we will reassess.  ----------------------------------------- 1:16 PM on 05/25/2018 -----------------------------------------  Discussed with Dr. Juleen China of nephrology he has actually seen the patient which I appreciate, he states that he has low suspicion for SBP but he will asked the dialysis nurse to come and tap his dialysis port to ensure that there is no complications.  Disposition per nephrology  ----------------------------------------- 2:49 PM on 05/25/2018 -----------------------------------------  Signed out to dr Quentin Cornwall at the end of my shift. ____________________________________________   FINAL CLINICAL IMPRESSION(S) / ED DIAGNOSES  Final diagnoses:  None      This chart was dictated using voice recognition software.  Despite best efforts to proofread,  errors can occur which can change meaning.      Schuyler Amor, MD 05/25/18 6967    Schuyler Amor, MD 05/25/18 8938    Schuyler Amor, MD 05/25/18 1026    Schuyler Amor, MD 05/25/18 1317     Schuyler Amor, MD 05/25/18 1450

## 2018-05-26 LAB — GLUCOSE, CAPILLARY
GLUCOSE-CAPILLARY: 174 mg/dL — AB (ref 70–99)
GLUCOSE-CAPILLARY: 119 mg/dL — AB (ref 70–99)
Glucose-Capillary: 111 mg/dL — ABNORMAL HIGH (ref 70–99)
Glucose-Capillary: 114 mg/dL — ABNORMAL HIGH (ref 70–99)
Glucose-Capillary: 124 mg/dL — ABNORMAL HIGH (ref 70–99)

## 2018-05-26 LAB — CBC
HCT: 32 % — ABNORMAL LOW (ref 39.0–52.0)
Hemoglobin: 10.2 g/dL — ABNORMAL LOW (ref 13.0–17.0)
MCH: 31.8 pg (ref 26.0–34.0)
MCHC: 31.9 g/dL (ref 30.0–36.0)
MCV: 99.7 fL (ref 80.0–100.0)
Platelets: 173 10*3/uL (ref 150–400)
RBC: 3.21 MIL/uL — ABNORMAL LOW (ref 4.22–5.81)
RDW: 15 % (ref 11.5–15.5)
WBC: 6.5 10*3/uL (ref 4.0–10.5)
nRBC: 0 % (ref 0.0–0.2)

## 2018-05-26 LAB — BASIC METABOLIC PANEL
Anion gap: 10 (ref 5–15)
BUN: 49 mg/dL — ABNORMAL HIGH (ref 6–20)
CO2: 28 mmol/L (ref 22–32)
Calcium: 9.7 mg/dL (ref 8.9–10.3)
Chloride: 94 mmol/L — ABNORMAL LOW (ref 98–111)
Creatinine, Ser: 9.96 mg/dL — ABNORMAL HIGH (ref 0.61–1.24)
GFR calc Af Amer: 7 mL/min — ABNORMAL LOW (ref >60)
GFR, EST NON AFRICAN AMERICAN: 6 mL/min — AB (ref >60)
Glucose, Bld: 129 mg/dL — ABNORMAL HIGH (ref 70–99)
POTASSIUM: 3.7 mmol/L (ref 3.5–5.1)
Sodium: 132 mmol/L — ABNORMAL LOW (ref 135–145)

## 2018-05-26 LAB — PATHOLOGIST SMEAR REVIEW

## 2018-05-26 MED ORDER — HYDRALAZINE HCL 20 MG/ML IJ SOLN
10.0000 mg | Freq: Four times a day (QID) | INTRAMUSCULAR | Status: DC | PRN
  Administered 2018-05-26: 10 mg via INTRAVENOUS
  Filled 2018-05-26: qty 1

## 2018-05-26 MED ORDER — NITROGLYCERIN 2 % TD OINT
0.5000 [in_us] | TOPICAL_OINTMENT | Freq: Four times a day (QID) | TRANSDERMAL | Status: DC
  Administered 2018-05-26 – 2018-05-27 (×5): 0.5 [in_us] via TOPICAL
  Filled 2018-05-26 (×6): qty 1

## 2018-05-26 MED ORDER — TRAMADOL HCL 50 MG PO TABS
50.0000 mg | ORAL_TABLET | Freq: Four times a day (QID) | ORAL | Status: DC | PRN
  Administered 2018-05-26: 50 mg via ORAL
  Filled 2018-05-26: qty 1

## 2018-05-26 MED ORDER — VANCOMYCIN 100 MG/ML FOR DIALYSIS
Status: DC
  Filled 2018-05-26: qty 3000

## 2018-05-26 MED FILL — Peritoneal Dialysis Solutions 344 MOSM/L: INTRAPERITONEAL | Qty: 3000 | Status: AC

## 2018-05-26 NOTE — Progress Notes (Signed)
BP continues to be elevated, post Coreg and Avapro; no PRNs; on-call Hospitalist paged; awaiting callback. Barbaraann Faster, RN 2:37 AM 05/26/2018

## 2018-05-26 NOTE — Progress Notes (Signed)
[place] [facility] [place] NAME: Jake Gomez    MR#:  [id number]    DATE OF BIRTH:  [birth year]  SUBJECTIVE:  Patient here with recurrent SBP  REVIEW OF SYSTEMS:    Review of Systems  Constitutional: Negative for fever, chills weight loss HENT: Negative for ear pain, nosebleeds, congestion, facial swelling, rhinorrhea, neck pain, neck stiffness and ear discharge.   Respiratory: Negative for cough, shortness of breath, wheezing  Cardiovascular: Negative for chest pain, palpitations and leg swelling.  Gastrointestinal: Negative for heartburn, abdominal pain, vomiting, diarrhea or consitpation Genitourinary: Negative for dysuria, urgency, frequency, hematuria Musculoskeletal: Negative for back pain or joint pain Neurological: Negative for dizziness, seizures, syncope, focal weakness,  numbness and headaches.  Hematological: Does not bruise/bleed easily.  Psychiatric/Behavioral: Negative for hallucinations, confusion, dysphoric mood    Tolerating Diet: yess      DRUG ALLERGIES:  No Known Allergies  VITALS:  Blood pressure (!) 167/80, pulse 78, temperature 98.2 F (36.8 C), temperature source Oral, resp. rate 18, height 5\' 9"  (1.753 m), weight 111.7 kg, SpO2 96 %.  PHYSICAL EXAMINATION:  Constitutional: Appears well-developed and well-nourished. No distress. HENT: Normocephalic. Marland Kitchen Oropharynx is clear and moist.  Eyes: Conjunctivae and EOM are normal. PERRLA, no scleral icterus.  Neck: Normal ROM. Neck supple. No JVD. No tracheal deviation. CVS: RRR, S1/S2 +, no murmurs, no gallops, no carotid bruit.  Pulmonary: Effort and breath sounds normal, no stridor, rhonchi, wheezes, rales.  Abdominal: Soft. BS +,  no distension, tenderness, rebound or guarding.  Peritoneal dialysis catheter in place and does not appear to be infected Musculoskeletal: Normal range of motion. No edema and no tenderness.  Neuro: Alert. CN 2-12 grossly intact. No  focal deficits. Skin: Skin is warm and dry. No rash noted. Psychiatric: Normal mood and affect.      LABORATORY PANEL:   CBC Recent Labs  Lab 05/26/18 0333  WBC 6.5  HGB 10.2*  HCT 32.0*  PLT 173   ------------------------------------------------------------------------------------------------------------------  Chemistries  Recent Labs  Lab 05/26/18 0333  NA 132*  K 3.7  CL 94*  CO2 28  GLUCOSE 129*  BUN 49*  CREATININE 9.96*  CALCIUM 9.7   ------------------------------------------------------------------------------------------------------------------  Cardiac Enzymes No results for input(s): TROPONINI in the last 168 hours. ------------------------------------------------------------------------------------------------------------------  RADIOLOGY:  Dg Abdomen Acute W/chest  Result Date: 05/25/2018 CLINICAL DATA:  Constipation. EXAM: DG ABDOMEN ACUTE W/ 1V CHEST COMPARISON:  08/05/2017 chest x-ray. FINDINGS: Low lung volumes. Mild cardiomegaly. Linear areas of atelectasis or scarring in the lungs bilaterally. Blunting of left costophrenic angle could reflect small left effusion or pleural thickening. Nonobstructive bowel gas pattern. No significant increase in stool burden. No organomegaly or free air. Peritoneal dialysis catheter present in the pelvis. IMPRESSION: No evidence of bowel obstruction or free air. Low lung volumes with linear areas of scarring or atelectasis throughout both lungs. Small left pleural effusion versus pleural thickening. Electronically Signed   By: Rolm Baptise M.D.   On: 05/25/2018 09:29     ASSESSMENT AND PLAN:   47 year old male with end-stage renal disease on hemodialysis with peritoneal dialysis and recent SBP treated with ceftaz edema and Levaquin for Pseudomonas who presents with abdominal pain and recurrent SBP.  1.  Peritonitis: Discussed case with nephrology and ID.  ID to see patient today.  Continue broad-spectrum  antibiotics. Follow-up on final culture.  2.  End-stage renal disease on peritoneal dialysis: Management as per nephrology  3.  Essential hypertension: Continue  Coreg Avapro   4.  Depression: Continue Zoloft and trazodone  5.  Diabetes: Continue sliding scale  Management plans discussed with the patient and he is in agreement.  CODE STATUS: full  TOTAL TIME TAKING CARE OF THIS PATIENT: 30 minutes.     POSSIBLE D/C 2-3 days, DEPENDING ON CLINICAL CONDITION.   Trang Bouse M.D on 05/26/2018 at 10:03 AM  Between 7am to 6pm - Pager - [phone] After 6pm go to [email or web address] - password [email or web address] [facility]  Office  [phone]  CC: Primary care physician; Juline Patch, MD  Note: This dictation was prepared with Dragon dictation along with smaller phrase technology. Any transcriptional errors that result from this process are unintentional.

## 2018-05-26 NOTE — Progress Notes (Signed)
CCPD completed

## 2018-05-26 NOTE — Progress Notes (Signed)
Initial Nutrition Assessment  DOCUMENTATION CODES:   Obesity unspecified  INTERVENTION:   - Provided education regarding nutrition therapy for ESRD on PD  - Agree with Regular diet at this time  - Recommend renal MVI daily  NUTRITION DIAGNOSIS:   Increased nutrient needs related to chronic illness (ERSD on PD) as evidenced by estimated needs.  GOAL:   Patient will meet greater than or equal to 90% of their needs  MONITOR:   PO intake, Weight trends, I & O's, Labs  REASON FOR ASSESSMENT:   Consult Assessment of nutrition requirement/status, Diet education  ASSESSMENT:   47 year old male who presented to the ED with constipation. PMH significant for DM, HTN, ESRD on PD, and pseudomonas SBP. Pt found to have peritonitis.  Spoke with pt at bedside. Pt in good spirits. Pt states that he follows a regular diet at home with some exceptions (avoids potatoes, limits milk products, does not drink dark sodas). Pt states that overall, he eats healthfully given he lives with his parents and his mother cooks healthy foods for his father who has cancer.  Pt reports that normally he has a great appetite and eats 3-4 meals daily. However, over the past week, his appetite has been poor.  Breakfast: egg, ham, and cheese sandwich Lunch: chicken on salad Dinner: cereal or milkshake  Pt states that he does not eat red meat.  Pt states that he meets with his dialysis team (MD, social work, RD, Therapist, sports) monthly and they discuss his labs. Pt states that "the labs don't lie" and he and his outpatient RD work together to normalize his labs. Pt takes Phoslo with meals. Discussed nutrition therapy for ESRD on PD extensively with pt. Pt expressed understanding. Encouraged pt to follow nutrition recommendations from outpatient RD and to speak with RD about further questions.  Pt states that he has been sick "over the past year" since he was admitted with PNA in December 2018. Pt endorses weight  fluctuations and staying "around 270 lbs." Per weight history in chart, pt with 15.3 kg weight loss since December 2018. This is a 12% weight loss which is not significant for timeframe. Noted progressive, steady weight loss over the past 1 year.  Wt Readings from Last 20 Encounters:  05/25/18 111.7 kg  05/24/18 112.5 kg  05/15/18 115.7 kg  05/03/18 117 kg  03/09/18 119.3 kg  02/17/18 119.6 kg  02/14/18 119.7 kg  01/17/18 118.4 kg  12/29/17 119.7 kg  11/29/17 123.8 kg  09/12/17 124.3 kg  08/05/17 132 kg  06/10/17 127 kg   Medications reviewed and include: Phoslo 2001 mg TID with meals, Colace, folic acid, SSI, Miralax, vitamin B-12, IV Fortaz  Labs reviewed: sodium 132 (L) CBG's: 114, 111  CCPD output: 813 ml today  NUTRITION - FOCUSED PHYSICAL EXAM:    Most Recent Value  Orbital Region  No depletion  Upper Arm Region  No depletion  Thoracic and Lumbar Region  No depletion  Buccal Region  No depletion  Temple Region  No depletion  Clavicle Bone Region  No depletion  Clavicle and Acromion Bone Region  No depletion  Scapular Bone Region  No depletion  Dorsal Hand  No depletion  Patellar Region  No depletion  Anterior Thigh Region  No depletion  Posterior Calf Region  No depletion  Edema (RD Assessment)  Mild [BLE]  Hair  Reviewed  Eyes  Reviewed  Mouth  Reviewed  Skin  Reviewed  Nails  Reviewed  Diet Order:   Diet Order            Diet regular Room service appropriate? Yes; Fluid consistency: Thin  Diet effective now              EDUCATION NEEDS:   Education needs have been addressed  Skin:  Skin Assessment: Reviewed RN Assessment  Last BM:  12/13 (medium type 4)  Height:   Ht Readings from Last 1 Encounters:  05/25/18 5\' 9"  (1.753 m)    Weight:   Wt Readings from Last 1 Encounters:  05/25/18 111.7 kg  EDW: 111 kg ABW: 102.75 kg  Ideal Body Weight:  72.7 kg  BMI:  Body mass index is 36.37 kg/m.  Estimated Nutritional Needs:    Kcal:  2600-2800  Protein:  130-145 grams  Fluid:  UOP + 1000 ml    Gaynell Face, MS, RD, LDN Inpatient Clinical Dietitian Pager: [phone] Weekend/After Hours: [phone]

## 2018-05-26 NOTE — Consult Note (Signed)
Infectious Disease     Reason for Consult: Recurrent PD associated peritonitis   Referring Physician: Earleen Newport, R Date of Admission:  05/25/2018   Active Problems:   SBP (spontaneous bacterial peritonitis) ([place])   HPI: Jamelle Goldston is a 47 y.o. male with a history of end-stage renal disease on peritoneal dialysis admitted December 12 with abdominal pain and evidence of peritonitis related to his PD catheter.  He had a recent admission November 18 through the 23rd with similar symptoms and was found at that time to have pseudomonal peritonitis.  Dialysate fluid count was 9000 at that time. He was treated with peritoneal ceftazidime and oral levofloxacin until December 10th or so (4 weeks).  However when he stopped the antibiotics he developed recurrent abdominal pain with 3 days. He denies fevers. Was having issues with his PD cath draining as well and a clot was found. Was also having constipation related to pain meds.  On admit wbc 7,  PD cell count done on admit 604.  Culture pending.  Past Medical History:  Diagnosis Date  . Diabetes mellitus without complication ([place])   . Hypertension   . Renal disorder    Past Surgical History:  Procedure Laterality Date  . peritonal dialysis     Social History   Tobacco Use  . Smoking status: Former Research scientist (life sciences)  . Smokeless tobacco: Former Systems developer    Quit date: 07/16/2009  Substance Use Topics  . Alcohol use: No    Frequency: Never  . Drug use: No   Family History  Problem Relation Age of Onset  . Thyroid disease Mother   . Stroke Father   . Cancer Father   . Diabetes Father     Allergies: No Known Allergies  Current antibiotics: Antibiotics Given (last 72 hours)    Date/Time Action Medication Dose Rate   05/25/18 2058 New Bag/Given   cefTAZidime (FORTAZ) 1 g in sodium chloride 0.9 % 100 mL IVPB 1 g 200 mL/hr   05/25/18 2151 New Bag/Given   vancomycin (VANCOCIN) 1,000 mg, cefTAZidime (FORTAZ) 1,500 mg, heparin 1,500 Units in dialysis  solution 1.5% low-MG/low-CA 3,000 mL dialysis solution        MEDICATIONS: . atorvastatin  80 mg Oral q morning - 10a  . bumetanide  2 mg Oral Daily  . calcium acetate  2,001 mg Oral TID WC  . carvedilol  6.25 mg Oral Daily  . docusate sodium  100 mg Oral BID  . fluticasone  2 spray Each Nare Daily  . folic acid  1 mg Oral Daily  . gentamicin cream  1 application Topical Daily  . heparin  5,000 Units Subcutaneous Q8H  . insulin aspart  0-5 Units Subcutaneous QHS  . insulin aspart  0-9 Units Subcutaneous TID WC  . irbesartan  300 mg Oral QHS  . nitroGLYCERIN  0.5 inch Topical Q6H  . polyethylene glycol  17 g Oral Daily  . sertraline  25 mg Oral Daily  . traZODone  50 mg Oral QHS  . cyanocobalamin  1,000 mcg Oral Daily    Review of Systems - 11 systems reviewed and negative per HPI   OBJECTIVE: Temp:  [97.6 F (36.4 C)-98.7 F (37.1 C)] 98.2 F (36.8 C) (12/13 0448) Pulse Rate:  [76-101] 78 (12/13 0444) Resp:  [18-20] 18 (12/13 0448) BP: (167-219)/(78-110) 167/80 (12/13 0444) SpO2:  [95 %-100 %] 96 % (12/13 0222) Weight:  [111.7 kg] 111.7 kg (12/12 2014) Physical Exam  Constitutional: He is oriented to person, place,  and time. Obese No distress.  HENT: anictericf Mouth/Throat: Oropharynx is clear and moist. No oropharyngeal exudate.  Cardiovascular: Normal rate, regular rhythm and normal heart sounds.Pulmonary/Chest: Effort normal and breath sounds normal. No respiratory distress. He has no wheezes.  Abdominal: obese, soft but diffuse TTP. PD cath site without tenderness or drainage Lymphadenopathy: He has no cervical adenopathy.  Neurological: He is alert and oriented to person, place, and time.  Skin: Skin is warm and dry. No rash noted. No erythema.  Psychiatric: He has a normal mood and affect. His behavior is normal.   LABS: Results for orders placed or performed during the hospital encounter of 05/25/18 (from the past 48 hour(s))  Basic metabolic panel      Status: Abnormal   Collection Time: 05/25/18 11:19 AM  Result Value Ref Range   Sodium 132 (L) 135 - 145 mmol/L   Potassium 3.7 3.5 - 5.1 mmol/L   Chloride 93 (L) 98 - 111 mmol/L   CO2 28 22 - 32 mmol/L   Glucose, Bld 121 (H) 70 - 99 mg/dL   BUN 48 (H) 6 - 20 mg/dL   Creatinine, Ser 10.07 (H) 0.61 - 1.24 mg/dL   Calcium 10.0 8.9 - 10.3 mg/dL   GFR calc non Af Amer 6 (L) >60 mL/min   GFR calc Af Amer 6 (L) >60 mL/min   Anion gap 11 5 - 15    Comment: Performed at [facility], [address] [place], [place] [address]  CBC with Differential     Status: Abnormal   Collection Time: 05/25/18 11:19 AM  Result Value Ref Range   WBC 7.5 4.0 - 10.5 K/uL   RBC 3.01 (L) 4.22 - 5.81 MIL/uL   Hemoglobin 9.7 (L) 13.0 - 17.0 g/dL   HCT 30.4 (L) 39.0 - 52.0 %   MCV 101.0 (H) 80.0 - 100.0 fL   MCH 32.2 26.0 - 34.0 pg   MCHC 31.9 30.0 - 36.0 g/dL   RDW 15.5 11.5 - 15.5 %   Platelets 168 150 - 400 K/uL   nRBC 0.0 0.0 - 0.2 %   Neutrophils Relative % 74 %   Neutro Abs 5.6 1.7 - 7.7 K/uL   Lymphocytes Relative 15 %   Lymphs Abs 1.1 0.7 - 4.0 K/uL   Monocytes Relative 8 %   Monocytes Absolute 0.6 0.1 - 1.0 K/uL   Eosinophils Relative 2 %   Eosinophils Absolute 0.1 0.0 - 0.5 K/uL   Basophils Relative 0 %   Basophils Absolute 0.0 0.0 - 0.1 K/uL   Immature Granulocytes 1 %   Abs Immature Granulocytes 0.05 0.00 - 0.07 K/uL    Comment: Performed at [facility], [address] [place], [place] [address]  Body fluid cell count with differential     Status: Abnormal   Collection Time: 05/25/18  4:51 PM  Result Value Ref Range   Fluid Type-FCT Peritoneal    Color, Fluid STRAW (A) YELLOW   Appearance, Fluid CLEAR CLEAR   WBC, Fluid 604 cu mm   Neutrophil Count, Fluid 50 %   Lymphs, Fluid 7 %   Monocyte-Macrophage-Serous Fluid 43 %   Eos, Fluid 0 %    Comment: Performed at [facility], [address] [place], [place] [address]  Body fluid culture     Status:  None (Preliminary result)   Collection Time: 05/25/18  4:51 PM  Result Value Ref Range   Specimen Description      PERITONEAL DIALYSATE  Performed at [facility], [address], [place], [place] [address]    Special Requests      FLUID Performed at [facility], [place], [place] [address]    Gram Stain      FEW WBC PRESENT,BOTH PMN AND MONONUCLEAR NO ORGANISMS SEEN Performed at [facility], [place] [address], [place], [place] [address]    Culture PENDING    Report Status PENDING   Glucose, capillary     Status: Abnormal   Collection Time: 05/25/18  9:18 PM  Result Value Ref Range   Glucose-Capillary 111 (H) 70 - 99 mg/dL  Basic metabolic panel     Status: Abnormal   Collection Time: 05/26/18  3:33 AM  Result Value Ref Range   Sodium 132 (L) 135 - 145 mmol/L   Potassium 3.7 3.5 - 5.1 mmol/L   Chloride 94 (L) 98 - 111 mmol/L   CO2 28 22 - 32 mmol/L   Glucose, Bld 129 (H) 70 - 99 mg/dL   BUN 49 (H) 6 - 20 mg/dL   Creatinine, Ser 9.96 (H) 0.61 - 1.24 mg/dL   Calcium 9.7 8.9 - 10.3 mg/dL   GFR calc non Af Amer 6 (L) >60 mL/min   GFR calc Af Amer 7 (L) >60 mL/min   Anion gap 10 5 - 15    Comment: Performed at [facility], [address] [place], [place] [address]  CBC     Status: Abnormal   Collection Time: 05/26/18  3:33 AM  Result Value Ref Range   WBC 6.5 4.0 - 10.5 K/uL   RBC 3.21 (L) 4.22 - 5.81 MIL/uL   Hemoglobin 10.2 (L) 13.0 - 17.0 g/dL   HCT 32.0 (L) 39.0 - 52.0 %   MCV 99.7 80.0 - 100.0 fL   MCH 31.8 26.0 - 34.0 pg   MCHC 31.9 30.0 - 36.0 g/dL   RDW 15.0 11.5 - 15.5 %   Platelets 173 150 - 400 K/uL   nRBC 0.0 0.0 - 0.2 %    Comment: Performed at [facility], [address] [place], [place] [address]  Glucose, capillary     Status: Abnormal   Collection Time: 05/26/18  7:31 AM  Result Value Ref Range   Glucose-Capillary 114 (H) 70 - 99 mg/dL   No components found for: ESR, C REACTIVE  PROTEIN MICRO: Recent Results (from the past 720 hour(s))  MRSA PCR Screening     Status: None   Collection Time: 05/02/18  8:21 AM  Result Value Ref Range Status   MRSA by PCR NEGATIVE NEGATIVE Final    Comment:        The GeneXpert MRSA Assay (FDA approved for NASAL specimens only), is one component of a comprehensive MRSA colonization surveillance program. It is not intended to diagnose MRSA infection nor to guide or monitor treatment for MRSA infections. Performed at [facility], [address] [place], [place] [address]   Body fluid culture     Status: None   Collection Time: 05/02/18  4:09 PM  Result Value Ref Range Status   Specimen Description   Final    PLEURAL Performed at [facility], [address], [place], [place] [address]    Special Requests   Final    NONE Performed at [facility], [address] [place], [place] [address]    Gram Stain   Final    FEW WBC PRESENT,BOTH PMN AND MONONUCLEAR  NO ORGANISMS SEEN    Culture   Final    NO GROWTH 3 DAYS Performed at [facility], [place] [address] [place], [place] [address]    Report Status 05/06/2018 FINAL  Final  Body fluid culture     Status: None (Preliminary result)   Collection Time: 05/25/18  4:51 PM  Result Value Ref Range Status   Specimen Description   Final    PERITONEAL DIALYSATE Performed at [facility], [address] [place], [place] [address]    Special Requests   Final    FLUID Performed at [facility], [address] [place], [place] [address]    Gram Stain   Final    FEW WBC PRESENT,BOTH PMN AND MONONUCLEAR NO ORGANISMS SEEN Performed at [facility], [place] [address] [place], [place] [address]    Culture PENDING  Incomplete   Report Status PENDING  Incomplete    IMAGING: Mr Brain Wo Contrast  Result Date: 05/06/2018 CLINICAL DATA:  Muscle weakness, generalized. EXAM: MRI HEAD WITHOUT CONTRAST TECHNIQUE:  Multiplanar, multiecho pulse sequences of the brain and surrounding structures were obtained without intravenous contrast. 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 No acute infarct, hemorrhage, hydrocephalus, or collection. Vascular: Major flow voids are preserved Skull and upper cervical spine: No evidence of marrow lesion Sinuses/Orbits: Negative Other: Motion degraded scan requiring fast brain protocol. IMPRESSION: 1. Chronic lacune in the right corona radiata. 2. No acute finding. 3. Motion degraded. Electronically Signed   By: Monte Fantasia M.D.   On: 05/06/2018 12:47   Dg Abdomen Acute W/chest  Result Date: 05/25/2018 CLINICAL DATA:  Constipation. EXAM: DG ABDOMEN ACUTE W/ 1V CHEST COMPARISON:  08/05/2017 chest x-ray. FINDINGS: Low lung volumes. Mild cardiomegaly. Linear areas of atelectasis or scarring in the lungs bilaterally. Blunting of left costophrenic angle could reflect small left effusion or pleural thickening. Nonobstructive bowel gas pattern. No significant increase in stool burden. No organomegaly or free air. Peritoneal dialysis catheter present in the pelvis. IMPRESSION: No evidence of bowel obstruction or free air. Low lung volumes with linear areas of scarring or atelectasis throughout both lungs. Small left pleural effusion versus pleural thickening. Electronically Signed   By: Rolm Baptise M.D.   On: 05/25/2018 09:29    Assessment:   Dink Creps is a 47 y.o. male with ESRD on PD now with recurrent abdominal pain and issues with blockage of the drainage from his dialysis catheter  Recent episode in November with Pseudomonas which was treated with intraperitoneal ceftazidime and oral levofloxacin for 28 day course.  PD cath was not exchanged.  Within 3 days of stopping abx developed recurrent abd pain.   I suspect he has recurrent peritonitis although issue is clouded by an problems  with the drainage from the PD cath and by constipation both of which may have been contributing some to his pain. His PD wbc is 600 wheras before it was 9000.    Recommendations At this point need to continue abx directed at the usual pathogens (vanco and ceftazidime) If pseudomonas grows again then will likely need cath removal or exchange as there is likely biofilm preventing clearance of the infection.  If different pathogen grows could consider directed therapy.  Thank you very much for allowing me to participate in the care of this patient. Please call with questions.   Cheral Marker. Ola Spurr, MD

## 2018-05-26 NOTE — Progress Notes (Signed)
[facility]  ROUNDING NOTE   Subjective:   Peritoneal dialysis last night. Tolerated treatment well. Reports pain has improved.   Objective:  Vital signs in last 24 hours:  Temp:  [97.6 F (36.4 C)-98.7 F (37.1 C)] 98.5 F (36.9 C) (12/13 1142) Pulse Rate:  [76-101] 83 (12/13 1142) Resp:  [18-20] 18 (12/13 0448) BP: (152-219)/(74-110) 152/74 (12/13 1142) SpO2:  [95 %-100 %] 97 % (12/13 1142) Weight:  [111.7 kg] 111.7 kg (12/12 2014)  Weight change:  Filed Weights   05/25/18 0836 05/25/18 2014  Weight: 110.2 kg 111.7 kg    Intake/Output: I/O last 3 completed shifts: In: 85631 [P.O.:120; Other:14500; IV Piggyback:100] Out: -    Intake/Output this shift:  Total I/O In: -  Out: 813 [Other:813]  Physical Exam: General: NAD,   Head: Normocephalic, atraumatic. Moist oral mucosal membranes  Eyes: Anicteric, PERRL  Neck: Supple, trachea midline  Lungs:  Clear to auscultation  Heart: Regular rate and rhythm  Abdomen:  +tender to palpation  Extremities: no peripheral edema.  Neurologic: Nonfocal, moving all four extremities  Skin: No lesions  Access: PD catheter    Basic Metabolic Panel: Recent Labs  Lab 05/25/18 1119 05/26/18 0333  NA 132* 132*  K 3.7 3.7  CL 93* 94*  CO2 28 28  GLUCOSE 121* 129*  BUN 48* 49*  CREATININE 10.07* 9.96*  CALCIUM 10.0 9.7    Liver Function Tests: No results for input(s): AST, ALT, ALKPHOS, BILITOT, PROT, ALBUMIN in the last 168 hours. No results for input(s): LIPASE, AMYLASE in the last 168 hours. No results for input(s): AMMONIA in the last 168 hours.  CBC: Recent Labs  Lab 05/25/18 1119 05/26/18 0333  WBC 7.5 6.5  NEUTROABS 5.6  --   HGB 9.7* 10.2*  HCT 30.4* 32.0*  MCV 101.0* 99.7  PLT 168 173    Cardiac Enzymes: No results for input(s): CKTOTAL, CKMB, CKMBINDEX, TROPONINI in the last 168 hours.  BNP: Invalid input(s): POCBNP  CBG: Recent Labs  Lab 05/25/18 2118 05/26/18 0731  05/26/18 1141  GLUCAP 111* 114* 174*    Microbiology: Results for orders placed or performed during the hospital encounter of 05/25/18  Body fluid culture     Status: None (Preliminary result)   Collection Time: 05/25/18  4:51 PM  Result Value Ref Range Status   Specimen Description   Final    PERITONEAL DIALYSATE Performed at [facility], [address] [place], [place] [address]    Special Requests   Final    FLUID Performed at [facility], [address] [place], [place] [address]    Gram Stain   Final    FEW WBC PRESENT,BOTH PMN AND MONONUCLEAR NO ORGANISMS SEEN Performed at [facility], [place] [address] [place],  [address]    Culture PENDING  Incomplete   Report Status PENDING  Incomplete    Coagulation Studies: No results for input(s): LABPROT, INR in the last 72 hours.  Urinalysis: No results for input(s): COLORURINE, LABSPEC, PHURINE, GLUCOSEU, HGBUR, BILIRUBINUR, KETONESUR, PROTEINUR, UROBILINOGEN, NITRITE, LEUKOCYTESUR in the last 72 hours.  Invalid input(s): APPERANCEUR    Imaging: Dg Abdomen Acute W/chest  Result Date: 05/25/2018 CLINICAL DATA:  Constipation. EXAM: DG ABDOMEN ACUTE W/ 1V CHEST COMPARISON:  08/05/2017 chest x-ray. FINDINGS: Low lung volumes. Mild cardiomegaly. Linear areas of atelectasis or scarring in the lungs bilaterally. Blunting of left costophrenic angle could reflect small left effusion or pleural thickening. Nonobstructive bowel gas pattern. No  significant increase in stool burden. No organomegaly or free air. Peritoneal dialysis catheter present in the pelvis. IMPRESSION: No evidence of bowel obstruction or free air. Low lung volumes with linear areas of scarring or atelectasis throughout both lungs. Small left pleural effusion versus pleural thickening. Electronically Signed   By: Rolm Baptise M.D.   On: 05/25/2018 09:29     Medications:   . dianeal solution for CAPD/CCPD with additives     .  atorvastatin  80 mg Oral q morning - 10a  . bumetanide  2 mg Oral Daily  . calcium acetate  2,001 mg Oral TID WC  . carvedilol  6.25 mg Oral Daily  . docusate sodium  100 mg Oral BID  . fluticasone  2 spray Each Nare Daily  . folic acid  1 mg Oral Daily  . gentamicin cream  1 application Topical Daily  . heparin  5,000 Units Subcutaneous Q8H  . insulin aspart  0-5 Units Subcutaneous QHS  . insulin aspart  0-9 Units Subcutaneous TID WC  . irbesartan  300 mg Oral QHS  . nitroGLYCERIN  0.5 inch Topical Q6H  . polyethylene glycol  17 g Oral Daily  . sertraline  25 mg Oral Daily  . traZODone  50 mg Oral QHS  . cyanocobalamin  1,000 mcg Oral Daily     Assessment/ Plan:  Jake Gomez is a 47 y.o. Hispanic male with end stage renal disease on peritoneal dialysis, hypertension, insulin dependent diabetes mellitus type II, diabetic neuropathy, diabetic retinopathy, hyperlipidemia  [facility] Jake [facility] PD EDW 111kg.  CCPD 10 hours 3 liter fills 4 exchanges with last fill 2.5 liters.   1. End Stage Renal Disease: on peritoneal dialysis  2. Peritonitis: pseudomonas aeruginosa on 11/13 at [facility].  Cell count of 604 Completed ceftaxidime 1.5g IP daily and levofloxacin 500mg  every other day. Completed course however with recurrence versus failure of outpatient therapy.  - Appreciate ID - Broad spectrum abx: ceftazidime and vancomycin  3. Hypertension: blood pressure at goal.  - amlodipine, carvedilol, irbestartan, labetalol  4. Anemia of chronic kidney disease:  Hemoglobin 10.2 - EPO as outpatient. Last dose was 12/06  5. Secondary Hyperparathyroidism: labs on 05/16/09: Phos 4.4,, Calcium 10.9 - elevated. PTH low at 97.  Calcium at goal today 9.7 - continue calcium acetate with meals. Monitor phos and calcium levels.    LOS: 1 Jake Gomez 12/13/20191:20 PM

## 2018-05-26 NOTE — Progress Notes (Signed)
Dr. Marcille Blanco notified of sustained elevated BP and no PRN medications with parameters. Acknowledged; New order written. Barbaraann Faster, RN 2:47 AM 05/26/2018

## 2018-05-26 NOTE — Progress Notes (Signed)
Pd started 

## 2018-05-27 DIAGNOSIS — D631 Anemia in chronic kidney disease: Secondary | ICD-10-CM | POA: Diagnosis not present

## 2018-05-27 DIAGNOSIS — N186 End stage renal disease: Secondary | ICD-10-CM | POA: Diagnosis not present

## 2018-05-27 DIAGNOSIS — D509 Iron deficiency anemia, unspecified: Secondary | ICD-10-CM | POA: Diagnosis not present

## 2018-05-27 DIAGNOSIS — Z992 Dependence on renal dialysis: Secondary | ICD-10-CM | POA: Diagnosis not present

## 2018-05-27 LAB — BASIC METABOLIC PANEL
Anion gap: 9 (ref 5–15)
BUN: 46 mg/dL — ABNORMAL HIGH (ref 6–20)
CO2: 29 mmol/L (ref 22–32)
Calcium: 9.4 mg/dL (ref 8.9–10.3)
Chloride: 94 mmol/L — ABNORMAL LOW (ref 98–111)
Creatinine, Ser: 9.95 mg/dL — ABNORMAL HIGH (ref 0.61–1.24)
GFR calc Af Amer: 7 mL/min — ABNORMAL LOW (ref >60)
GFR calc non Af Amer: 6 mL/min — ABNORMAL LOW (ref >60)
Glucose, Bld: 185 mg/dL — ABNORMAL HIGH (ref 70–99)
Potassium: 2.9 mmol/L — ABNORMAL LOW (ref 3.5–5.1)
Sodium: 132 mmol/L — ABNORMAL LOW (ref 135–145)

## 2018-05-27 LAB — BODY FLUID CELL COUNT WITH DIFFERENTIAL
Eos, Fluid: 6 %
LYMPHS FL: 11 %
MONOCYTE-MACROPHAGE-SEROUS FLUID: 12 %
Neutrophil Count, Fluid: 71 %
Total Nucleated Cell Count, Fluid: 74 cu mm

## 2018-05-27 LAB — CBC
HCT: 28 % — ABNORMAL LOW (ref 39.0–52.0)
Hemoglobin: 8.9 g/dL — ABNORMAL LOW (ref 13.0–17.0)
MCH: 32.2 pg (ref 26.0–34.0)
MCHC: 31.8 g/dL (ref 30.0–36.0)
MCV: 101.4 fL — ABNORMAL HIGH (ref 80.0–100.0)
PLATELETS: 169 10*3/uL (ref 150–400)
RBC: 2.76 MIL/uL — ABNORMAL LOW (ref 4.22–5.81)
RDW: 15.2 % (ref 11.5–15.5)
WBC: 6.6 10*3/uL (ref 4.0–10.5)
nRBC: 0 % (ref 0.0–0.2)

## 2018-05-27 LAB — GLUCOSE, CAPILLARY
Glucose-Capillary: 147 mg/dL — ABNORMAL HIGH (ref 70–99)
Glucose-Capillary: 180 mg/dL — ABNORMAL HIGH (ref 70–99)

## 2018-05-27 LAB — HIV ANTIBODY (ROUTINE TESTING W REFLEX): HIV Screen 4th Generation wRfx: NONREACTIVE

## 2018-05-27 MED ORDER — VANCOMYCIN 100 MG/ML FOR DIALYSIS
Status: DC
  Filled 2018-05-27: qty 3000

## 2018-05-27 MED ORDER — INSULIN REGULAR HUMAN 100 UNIT/ML IJ SOLN: INTRAMUSCULAR | 4 refills | Status: DC

## 2018-05-27 MED ORDER — VANCOMYCIN 100 MG/ML FOR DIALYSIS: Status: DC

## 2018-05-27 MED ORDER — TRAMADOL HCL 50 MG PO TABS: 50.0000 mg | ORAL_TABLET | Freq: Four times a day (QID) | ORAL | 0 refills | Status: DC | PRN

## 2018-05-27 MED ORDER — VANCOMYCIN 100 MG/ML FOR DIALYSIS: 0 refills | Status: DC

## 2018-05-27 NOTE — Care Management Note (Signed)
Case Management Note  Patient Details  Name: Jake Gomez MRN: [id number] Date of Birth: 16-Sep-1970  Subjective/Objective:   Patient to be discharged per MD order. Orders in place for home health services. CMS Medicare.gov Compare Post Acute Care list reviewed with patient and he is currently open to [place] care and would like to resume care with them. Referral confirmed with [facility] about resuming care. No DME needs. Per Dr Juleen China PD has been set up and there is a scheduled visit for start of care regarding PD and abx tomorrow at 1300. No further RNCM needs. Ines Bloomer RN BSN RNCM [phone]                 Action/Plan:   Expected Discharge Date:  05/27/18               Expected Discharge Plan:  [place]  In-House Referral:     Discharge planning Services  CM Consult  Post Acute Care Choice:  Home Health Choice offered to:  Patient  DME Arranged:    DME Agency:     HH Arranged:  RN, PT, Nurse's Aide [place] Agency:  [place]  Status of Service:  Completed, signed off  If discussed at [place] of Stay Meetings, dates discussed:    Additional Comments:  Latanya Maudlin, RN 05/27/2018, 12:03 PM

## 2018-05-27 NOTE — Discharge Summary (Addendum)
[facility] NAME: Jake Gomez    MR#:  [id number]  DATE OF BIRTH:  Oct 25, 1970  DATE OF ADMISSION:  05/25/2018 ADMITTING PHYSICIAN: Loletha Grayer, MD  DATE OF DISCHARGE: 05/27/2018  PRIMARY CARE PHYSICIAN: Juline Patch, MD    ADMISSION DIAGNOSIS:  SBP (spontaneous bacterial peritonitis) ([place]) [K65.2]  DISCHARGE DIAGNOSIS:  Active Problems:   SBP (spontaneous bacterial peritonitis) ([place])   SECONDARY DIAGNOSIS:   Past Medical History:  Diagnosis Date  . Diabetes mellitus without complication ([place])   . Hypertension   . Renal disorder     HOSPITAL COURSE:   47 year old male with end-stage renal disease on hemodialysis with peritoneal dialysis and recent SBP treated with ceftaz edema and Levaquin for Pseudomonas who presents with abdominal pain and recurrent SBP.  1.    Culture-negative spontaneous bacterial peritonitis:  Patient was followed by nephrology and infectious disease consultant as well in the hospital. He had recent episode in November of SBP with Pseudomonas and was treated with intraperitoneal ceftazidime and oral Levaquin for 28-day course.   It is felt that the recurrent peritonitis was clouded by problems with the drainage from the PD catheter and by constipation both of which may be contributing to the patient's abdominal pain which he came to the emergency room for.   Dr. Ola Spurr recommended treatment for peritoneal dialysis associated peritonitis (culture-negative) with a 2-week course of vancomycin and ceftazidime intraperitoneal if the cultures remain negative.  If Pseudomonas grows again then patient will need his catheter removed or exchanged.    2.  End-stage renal disease on peritoneal dialysis: Management as per nephrology  3.  Essential hypertension: We will continue Coreg and Avapro   4.  Depression: Continue Zoloft and trazodone  5.  Diabetes: Continue ADA diet with outpatient  regimen  DISCHARGE CONDITIONS AND DIET:   Stable for discharge on heart healthy diabetic renal diet  CONSULTS OBTAINED:  Treatment Team:  Leonel Ramsay, MD Lavonia Dana, MD  DRUG ALLERGIES:  No Known Allergies  DISCHARGE MEDICATIONS:   Allergies as of 05/27/2018   No Known Allergies     Medication List    STOP taking these medications   bumetanide 2 MG tablet Commonly known as:  BUMEX   cefTAZidime 1,500 mg in dialysis solution 2.5% low-MG/low-CA 394 MOSM/L 3,000 mL   cephALEXin 500 MG capsule Commonly known as:  KEFLEX   oxyCODONE-acetaminophen 5-325 MG tablet Commonly known as:  PERCOCET     TAKE these medications   acetaminophen 500 MG tablet Commonly known as:  TYLENOL Take 500 mg by mouth every 6 (six) hours as needed for mild pain.   atorvastatin 80 MG tablet Commonly known as:  LIPITOR Take 1 tablet (80 mg total) by mouth every morning.   calcium acetate 667 MG capsule Commonly known as:  PHOSLO Take 2,001 mg by mouth 3 (three) times daily with meals.   carvedilol 6.25 MG tablet Commonly known as:  COREG Take 6.25 mg by mouth daily.   cyanocobalamin 1000 MCG tablet Take 1 tablet (1,000 mcg total) by mouth daily.   docusate sodium 100 MG capsule Commonly known as:  COLACE Take 1 capsule (100 mg total) by mouth 2 (two) times daily.   fluticasone 50 MCG/ACT nasal spray Commonly known as:  FLONASE Place 2 sprays into both nostrils daily.   folic acid 1 MG tablet Commonly known as:  FOLVITE Take 1 tablet (1 mg total) by mouth daily.  glipiZIDE 5 MG tablet Commonly known as:  GLUCOTROL Take 1 tablet (5 mg total) by mouth 2 (two) times daily before a meal.   insulin regular 100 units/mL injection Commonly known as:  NOVOLIN R RELION Inject subcutaneously daily as directed on the following sliding scale: 2 units for blood glucose reading equal to or greater than 150 and 1 unit for each blood glucose reading of 50.   Insulin Syringes  (Disposable) U-100 1 ML Misc 1 each by Does not apply route daily.   irbesartan 300 MG tablet Commonly known as:  AVAPRO Take 300 mg by mouth at bedtime.   polyethylene glycol packet Commonly known as:  MIRALAX / GLYCOLAX Take 17 g by mouth daily.   RELION INSULIN SYRINGE 1ML/31G 31G X 5/16" 1 ML Misc Generic drug:  Insulin Syringe-Needle U-100 1 EACH BY DOSE NOT APPLY ROUTE DAILY   sertraline 25 MG tablet Commonly known as:  ZOLOFT Take 1 tablet (25 mg total) by mouth daily. One half tablet a day for 10 days, then 1 a day   traMADol 50 MG tablet Commonly known as:  ULTRAM Take 1 tablet (50 mg total) by mouth every 6 (six) hours as needed for moderate pain.   traZODone 50 MG tablet Commonly known as:  DESYREL Take 50 mg by mouth at bedtime. Dr Candiss Norse   vancomycin 1,000 mg, cefTAZidime 1,500 mg, heparin 1,500 Units in dialysis solution 1.5% low-MG/low-CA 344 MOSM/L 3,000 mL 1000mg  vanc  1500mg  ceftazadime with PD Daily for 2 weeks         Today   CHIEF COMPLAINT:  Patient is doing well without abdominal pain.  Constipation resolved.   VITAL SIGNS:  Blood pressure (!) 167/80, pulse 80, temperature 97.9 F (36.6 C), temperature source Oral, resp. rate 20, height 5\' 9"  (1.753 m), weight 113.9 kg, SpO2 95 %.   REVIEW OF SYSTEMS:  Review of Systems  Constitutional: Negative.  Negative for chills, fever and malaise/fatigue.  HENT: Negative.  Negative for ear discharge, ear pain, hearing loss, nosebleeds and sore throat.   Eyes: Negative.  Negative for blurred vision and pain.  Respiratory: Negative.  Negative for cough, hemoptysis, shortness of breath and wheezing.   Cardiovascular: Negative.  Negative for chest pain, palpitations and leg swelling.  Gastrointestinal: Negative.  Negative for abdominal pain, blood in stool, diarrhea, nausea and vomiting.  Genitourinary: Negative.  Negative for dysuria.  Musculoskeletal: Negative.  Negative for back pain.  Skin:  Negative.   Neurological: Negative for dizziness, tremors, speech change, focal weakness, seizures and headaches.  Endo/Heme/Allergies: Negative.  Does not bruise/bleed easily.  Psychiatric/Behavioral: Negative.  Negative for depression, hallucinations and suicidal ideas.     PHYSICAL EXAMINATION:  GENERAL:  47 y.o.-year-old patient lying patient lying in the bed with no acute distress.  NECK:  Supple, no jugular venous distention. No thyroid enlargement, no tenderness.  LUNGS: Normal breath sounds bilaterally, no wheezing, rales,rhonchi  No use of accessory muscles of respiration.  CARDIOVASCULAR: S1, S2 normal. No murmurs, rubs, or gallops.  ABDOMEN: Soft, non-tender, non-distended. Bowel sounds present. No organomegaly or mass.  Dialysis catheter without signs of infection EXTREMITIES: No pedal edema, cyanosis, or clubbing.  PSYCHIATRIC: The patient is alert and oriented x 3.  SKIN: No obvious rash, lesion, or ulcer.   DATA REVIEW:   CBC Recent Labs  Lab 05/27/18 0548  WBC 6.6  HGB 8.9*  HCT 28.0*  PLT 169    Chemistries  Recent Labs  Lab 05/27/18 0548  NA 132*  K 2.9*  CL 94*  CO2 29  GLUCOSE 185*  BUN 46*  CREATININE 9.95*  CALCIUM 9.4    Cardiac Enzymes No results for input(s): TROPONINI in the last 168 hours.  Microbiology Results  @MICRORSLT48 @  RADIOLOGY:  No results found.    Allergies as of 05/27/2018   No Known Allergies     Medication List    STOP taking these medications   bumetanide 2 MG tablet Commonly known as:  BUMEX   cefTAZidime 1,500 mg in dialysis solution 2.5% low-MG/low-CA 394 MOSM/L 3,000 mL   cephALEXin 500 MG capsule Commonly known as:  KEFLEX   oxyCODONE-acetaminophen 5-325 MG tablet Commonly known as:  PERCOCET     TAKE these medications   acetaminophen 500 MG tablet Commonly known as:  TYLENOL Take 500 mg by mouth every 6 (six) hours as needed for mild pain.   atorvastatin 80 MG tablet Commonly known as:  LIPITOR Take 1  tablet (80 mg total) by mouth every morning.   calcium acetate 667 MG capsule Commonly known as:  PHOSLO Take 2,001 mg by mouth 3 (three) times daily with meals.   carvedilol 6.25 MG tablet Commonly known as:  COREG Take 6.25 mg by mouth daily.   cyanocobalamin 1000 MCG tablet Take 1 tablet (1,000 mcg total) by mouth daily.   docusate sodium 100 MG capsule Commonly known as:  COLACE Take 1 capsule (100 mg total) by mouth 2 (two) times daily.   fluticasone 50 MCG/ACT nasal spray Commonly known as:  FLONASE Place 2 sprays into both nostrils daily.   folic acid 1 MG tablet Commonly known as:  FOLVITE Take 1 tablet (1 mg total) by mouth daily.   glipiZIDE 5 MG tablet Commonly known as:  GLUCOTROL Take 1 tablet (5 mg total) by mouth 2 (two) times daily before a meal.   insulin regular 100 units/mL injection Commonly known as:  NOVOLIN R RELION Inject subcutaneously daily as directed on the following sliding scale: 2 units for blood glucose reading equal to or greater than 150 and 1 unit for each blood glucose reading of 50.   Insulin Syringes (Disposable) U-100 1 ML Misc 1 each by Does not apply route daily.   irbesartan 300 MG tablet Commonly known as:  AVAPRO Take 300 mg by mouth at bedtime.   polyethylene glycol packet Commonly known as:  MIRALAX / GLYCOLAX Take 17 g by mouth daily.   RELION INSULIN SYRINGE 1ML/31G 31G X 5/16" 1 ML Misc Generic drug:  Insulin Syringe-Needle U-100 1 EACH BY DOSE NOT APPLY ROUTE DAILY   sertraline 25 MG tablet Commonly known as:  ZOLOFT Take 1 tablet (25 mg total) by mouth daily. One half tablet a day for 10 days, then 1 a day   traMADol 50 MG tablet Commonly known as:  ULTRAM Take 1 tablet (50 mg total) by mouth every 6 (six) hours as needed for moderate pain.   traZODone 50 MG tablet Commonly known as:  DESYREL Take 50 mg by mouth at bedtime. Dr Candiss Norse   vancomycin 1,000 mg, cefTAZidime 1,500 mg, heparin 1,500 Units in  dialysis solution 1.5% low-MG/low-CA 344 MOSM/L 3,000 mL 1000mg  vanc  1500mg  ceftazadime with PD Daily for 2 weeks          Management plans discussed with the patient and he is in agreement. Stable for discharge home  Patient should follow up with pcp  CODE STATUS:     Code Status Orders  (From admission, onward)  Start     Ordered   05/25/18 1902  Full code  Continuous     05/25/18 1902        Code Status History    Date Active Date Inactive Code Status Order ID Comments User Context   05/02/2018 0309 05/06/2018 1907 Full Code [id number]  Harrie Foreman, MD Inpatient      TOTAL TIME TAKING CARE OF THIS PATIENT: 38 minutes.    Note: This dictation was prepared with Dragon dictation along with smaller phrase technology. Any transcriptional errors that result from this process are unintentional.  Edwen Mclester M.D on 05/27/2018 at 12:40 PM  Between 7am to 6pm - Pager - [phone] After 6pm go to [email or web address] - password [email or web address] [facility]  Office  [phone]  CC: Primary care physician; Juline Patch, MD

## 2018-05-27 NOTE — Progress Notes (Signed)
[facility] INFECTIOUS DISE[facility] PROGRESS NOTE Date of Admission:  05/25/2018     ID: Jake Gomez is a 47 y.o. male with PD cath associated recurrent peritonitis Active Problems:   SBP (spontaneous bacterial peritonitis) ([place])   Subjective: No fevers, much less abd pain  ROS  Eleven systems are reviewed and negative except per hpi  Medications:  Antibiotics Given (last 72 hours)    Date/Time Action Medication Dose Rate   05/25/18 2058 New Bag/Given   cefTAZidime (FORTAZ) 1 g in sodium chloride 0.9 % 100 mL IVPB 1 g 200 mL/hr   05/25/18 2151 New Bag/Given   vancomycin (VANCOCIN) 1,000 mg, cefTAZidime (FORTAZ) 1,500 mg, heparin 1,500 Units in dialysis solution 1.5% low-MG/low-CA 3,000 mL dialysis solution       . atorvastatin  80 mg Oral q morning - 10a  . bumetanide  2 mg Oral Daily  . calcium acetate  2,001 mg Oral TID WC  . carvedilol  6.25 mg Oral Daily  . docusate sodium  100 mg Oral BID  . fluticasone  2 spray Each Nare Daily  . folic acid  1 mg Oral Daily  . gentamicin cream  1 application Topical Daily  . heparin  5,000 Units Subcutaneous Q8H  . insulin aspart  0-5 Units Subcutaneous QHS  . insulin aspart  0-9 Units Subcutaneous TID WC  . irbesartan  300 mg Oral QHS  . nitroGLYCERIN  0.5 inch Topical Q6H  . polyethylene glycol  17 g Oral Daily  . sertraline  25 mg Oral Daily  . traZODone  50 mg Oral QHS  . cyanocobalamin  1,000 mcg Oral Daily    Objective: Vital signs in last 24 hours: Temp:  [97.6 F (36.4 C)-98.5 F (36.9 C)] 98.4 F (36.9 C) (12/14 0504) Pulse Rate:  [81-85] 85 (12/14 0504) Resp:  [18-20] 20 (12/14 0504) BP: (152-157)/(74-75) 157/75 (12/14 0504) SpO2:  [96 %-97 %] 96 % (12/14 0504) Weight:  [113.9 kg] 113.9 kg (12/14 0504) Constitutional: He is oriented to person, place, and time. Obese No distress.  HENT: anictericf Mouth/Throat: Oropharynx is clear and moist. No oropharyngeal exudate.  Cardiovascular: Normal rate, regular rhythm  and normal heart sounds.Pulmonary/Chest: Effort normal and breath sounds normal. No respiratory distress. He has no wheezes.  Abdominal: obese, soft but diffuse TTP. PD cath site without tenderness or drainage Lymphadenopathy: He has no cervical adenopathy.  Neurological: He is alert and oriented to person, place, and time.  Skin: Skin is warm and dry. No rash noted. No erythema.  Psychiatric: He has a normal mood and affect. His behavior is normal.   Lab Results Recent Labs    05/26/18 0333 05/27/18 0548  WBC 6.5 6.6  HGB 10.2* 8.9*  HCT 32.0* 28.0*  NA 132* 132*  K 3.7 2.9*  CL 94* 94*  CO2 28 29  BUN 49* 46*  CREATININE 9.96* 9.95*    Microbiology: Results for orders placed or performed during the hospital encounter of 05/25/18  Body fluid culture     Status: None (Preliminary result)   Collection Time: 05/25/18  4:51 PM  Result Value Ref Range Status   Specimen Description   Final    PERITONEAL DIALYSATE Performed at [facility], [address], [place], [place] [address]    Special Requests   Final    FLUID Performed at [facility], [address] [place],  [address]    Gram Stain   Final    FEW WBC PRESENT,BOTH PMN  AND MONONUCLEAR NO ORGANISMS SEEN Performed at [facility], [place] [address], [place], [place] [address]    Culture PENDING  Incomplete   Report Status PENDING  Incomplete    Studies/Results: No results found.  Assessment/Plan: Jake Gomez is a 47 y.o. male with ESRD on PD now with recurrent abdominal pain and issues with blockage of the drainage from his dialysis catheter  Recent episode in November with Pseudomonas which was treated with intraperitoneal ceftazidime and oral levofloxacin for 28 day course.  PD cath was not exchanged.  Within 3 days of stopping abx developed recurrent abd pain.   I suspect he has recurrent peritonitis although issue is clouded by an problems with the drainage from the PD cath and  by constipation both of which may have been contributing some to his pain. His PD wbc is 600 wheras before it was 9000.  12/14- cultures remain negative.   Recommendations If cultures remain negative then I would repeat treatment for peritoneal dialysis associated peritonitis (culture negative) with a 2 week course of IP vanco and ceftazidime.  If pseudomonas grows again then will likely need cath removal or exchange as there is likely biofilm preventing clearance of the infection. He would then need 2-4 weeks treatment with IP ceftazidime after cath removal or replacement.    If different pathogen grows could consider directed therapy Thank you very much for the consult. Will follow with you.  Leonel Ramsay   05/27/2018, 9:21 AM

## 2018-05-27 NOTE — Progress Notes (Signed)
[facility]  ROUNDING NOTE   Subjective:   Peritoneal dialysis last night. Tolerated treatment well. Reports pain has improved.   Daughter at bedside.   Objective:  Vital signs in last 24 hours:  Temp:  [97.6 F (36.4 C)-98.5 F (36.9 C)] 98.4 F (36.9 C) (12/14 0504) Pulse Rate:  [81-85] 85 (12/14 0504) Resp:  [18-20] 20 (12/14 0504) BP: (152-157)/(74-75) 157/75 (12/14 0504) SpO2:  [96 %-97 %] 96 % (12/14 0504) Weight:  [113.9 kg] 113.9 kg (12/14 0504)  Weight change: 3.629 kg Filed Weights   05/25/18 0836 05/25/18 2014 05/27/18 0504  Weight: 110.2 kg 111.7 kg 113.9 kg    Intake/Output: I/O last 3 completed shifts: In: 20947 [P.O.:360; Other:14500; IV Piggyback:100] Out: 813 [Other:813]   Intake/Output this shift:  Total I/O In: 14500 [Other:14500] Out: 701 [Other:701]  Physical Exam: General: NAD,   Head: Normocephalic, atraumatic. Moist oral mucosal membranes  Eyes: Anicteric, PERRL  Neck: Supple, trachea midline  Lungs:  Clear to auscultation  Heart: Regular rate and rhythm  Abdomen:  + mild tender to palpation  Extremities: no peripheral edema.  Neurologic: Nonfocal, moving all four extremities  Skin: No lesions  Access: PD catheter    Basic Metabolic Panel: Recent Labs  Lab 05/25/18 1119 05/26/18 0333 05/27/18 0548  NA 132* 132* 132*  K 3.7 3.7 2.9*  CL 93* 94* 94*  CO2 28 28 29   GLUCOSE 121* 129* 185*  BUN 48* 49* 46*  CREATININE 10.07* 9.96* 9.95*  CALCIUM 10.0 9.7 9.4    Liver Function Tests: No results for input(s): AST, ALT, ALKPHOS, BILITOT, PROT, ALBUMIN in the last 168 hours. No results for input(s): LIPASE, AMYLASE in the last 168 hours. No results for input(s): AMMONIA in the last 168 hours.  CBC: Recent Labs  Lab 05/25/18 1119 05/26/18 0333 05/27/18 0548  WBC 7.5 6.5 6.6  NEUTROABS 5.6  --   --   HGB 9.7* 10.2* 8.9*  HCT 30.4* 32.0* 28.0*  MCV 101.0* 99.7 101.4*  PLT 168 173 169    Cardiac  Enzymes: No results for input(s): CKTOTAL, CKMB, CKMBINDEX, TROPONINI in the last 168 hours.  BNP: Invalid input(s): POCBNP  CBG: Recent Labs  Lab 05/26/18 0731 05/26/18 1141 05/26/18 1619 05/26/18 2106 05/27/18 0747  GLUCAP 114* 174* 119* 124* 147*    Microbiology: Results for orders placed or performed during the hospital encounter of 05/25/18  Body fluid culture     Status: None (Preliminary result)   Collection Time: 05/25/18  4:51 PM  Result Value Ref Range Status   Specimen Description   Final    PERITONEAL DIALYSATE Performed at [facility], [address], [place], [place] [address]    Special Requests   Final    FLUID Performed at [facility], [address] [place], [place] [address]    Gram Stain   Final    FEW WBC PRESENT,BOTH PMN AND MONONUCLEAR NO ORGANISMS SEEN    Culture   Final    NO GROWTH 1 DAY Performed at [facility], [place] [address], [place], [place] [address]    Report Status PENDING  Incomplete    Coagulation Studies: No results for input(s): LABPROT, INR in the last 72 hours.  Urinalysis: No results for input(s): COLORURINE, LABSPEC, PHURINE, GLUCOSEU, HGBUR, BILIRUBINUR, KETONESUR, PROTEINUR, UROBILINOGEN, NITRITE, LEUKOCYTESUR in the last 72 hours.  Invalid input(s): APPERANCEUR    Imaging: No results found.   Medications:   . dianeal solution for  CAPD/CCPD with additives     . atorvastatin  80 mg Oral q morning - 10a  . bumetanide  2 mg Oral Daily  . calcium acetate  2,001 mg Oral TID WC  . carvedilol  6.25 mg Oral Daily  . docusate sodium  100 mg Oral BID  . fluticasone  2 spray Each Nare Daily  . folic acid  1 mg Oral Daily  . gentamicin cream  1 application Topical Daily  . heparin  5,000 Units Subcutaneous Q8H  . insulin aspart  0-5 Units Subcutaneous QHS  . insulin aspart  0-9 Units Subcutaneous TID WC  . irbesartan  300 mg Oral QHS  . nitroGLYCERIN  0.5 inch Topical Q6H  .  polyethylene glycol  17 g Oral Daily  . sertraline  25 mg Oral Daily  . traZODone  50 mg Oral QHS  . cyanocobalamin  1,000 mcg Oral Daily     Assessment/ Plan:  [name] is a 47 y.o. Hispanic male with end stage renal disease on peritoneal dialysis, hypertension, insulin dependent diabetes mellitus type II, diabetic neuropathy, diabetic retinopathy, hyperlipidemia  [facility] PD EDW 111kg.  CCPD 10 hours 3 liter fills 4 exchanges with last fill 2.5 liters.   1. End Stage Renal Disease: on peritoneal dialysis Tolerated treatment last night.  - Miralax to help with constipation.   2. Peritonitis: pseudomonas aeruginosa on 11/13 at [facility].  Cell count of 604 Completed ceftaxidime 1.5g IP daily and levofloxacin 500mg  every other day. Completed course however with recurrence versus failure of outpatient therapy.  - Appreciate ID - Broad spectrum abx: ceftazidime 1.5gm for 2 more weeks (end date 12/26)  3. Hypertension: blood pressure at goal.  - amlodipine, carvedilol, irbestartan, labetalol  4. Anemia of chronic kidney disease:    - EPO as outpatient. Last dose was 12/06  5. Secondary Hyperparathyroidism: labs on 05/16/09: Phos 4.4,, Calcium 10.9 - elevated. PTH low at 97.  Calcium at goal today 9.7 - continue calcium acetate with meals. Monitor phos and calcium levels.    LOS: 2 Caitlin Hillmer 12/14/201911:11 AM

## 2018-05-28 DIAGNOSIS — D631 Anemia in chronic kidney disease: Secondary | ICD-10-CM | POA: Diagnosis not present

## 2018-05-28 DIAGNOSIS — Z992 Dependence on renal dialysis: Secondary | ICD-10-CM | POA: Diagnosis not present

## 2018-05-28 DIAGNOSIS — N186 End stage renal disease: Secondary | ICD-10-CM | POA: Diagnosis not present

## 2018-05-28 DIAGNOSIS — D509 Iron deficiency anemia, unspecified: Secondary | ICD-10-CM | POA: Diagnosis not present

## 2018-05-29 ENCOUNTER — Telehealth: Payer: Self-pay | Admitting: Family Medicine

## 2018-05-29 DIAGNOSIS — N186 End stage renal disease: Secondary | ICD-10-CM | POA: Diagnosis not present

## 2018-05-29 DIAGNOSIS — B965 Pseudomonas (aeruginosa) (mallei) (pseudomallei) as the cause of diseases classified elsewhere: Secondary | ICD-10-CM | POA: Diagnosis not present

## 2018-05-29 DIAGNOSIS — Z992 Dependence on renal dialysis: Secondary | ICD-10-CM | POA: Diagnosis not present

## 2018-05-29 DIAGNOSIS — K659 Peritonitis, unspecified: Secondary | ICD-10-CM | POA: Diagnosis not present

## 2018-05-29 LAB — BODY FLUID CULTURE: Culture: NO GROWTH

## 2018-05-29 LAB — PATHOLOGIST SMEAR REVIEW

## 2018-05-29 NOTE — Telephone Encounter (Signed)
Attempted to reach patient for TCM call and to schedule follow up appt. No answer and voicemail not set up. Please notify me if pt returns calls. Thank you!

## 2018-05-30 DIAGNOSIS — Z992 Dependence on renal dialysis: Secondary | ICD-10-CM | POA: Diagnosis not present

## 2018-05-30 DIAGNOSIS — N186 End stage renal disease: Secondary | ICD-10-CM | POA: Diagnosis not present

## 2018-05-30 DIAGNOSIS — D631 Anemia in chronic kidney disease: Secondary | ICD-10-CM | POA: Diagnosis not present

## 2018-05-30 LAB — HEMOGLOBIN A1C
Hgb A1c MFr Bld: 5.6 % (ref 4.8–5.6)
Mean Plasma Glucose: 114.02 mg/dL

## 2018-05-31 DIAGNOSIS — N186 End stage renal disease: Secondary | ICD-10-CM | POA: Diagnosis not present

## 2018-05-31 DIAGNOSIS — Z992 Dependence on renal dialysis: Secondary | ICD-10-CM | POA: Diagnosis not present

## 2018-05-31 DIAGNOSIS — D631 Anemia in chronic kidney disease: Secondary | ICD-10-CM | POA: Diagnosis not present

## 2018-05-31 NOTE — Telephone Encounter (Signed)
Unable to reach pt for TCM call. He is scheduled for routine follow up on 06/19/18. Please advise if you would like to see patient earlier. Thank you!

## 2018-06-01 DIAGNOSIS — D631 Anemia in chronic kidney disease: Secondary | ICD-10-CM | POA: Diagnosis not present

## 2018-06-01 DIAGNOSIS — N186 End stage renal disease: Secondary | ICD-10-CM | POA: Diagnosis not present

## 2018-06-01 DIAGNOSIS — Z992 Dependence on renal dialysis: Secondary | ICD-10-CM | POA: Diagnosis not present

## 2018-06-02 ENCOUNTER — Other Ambulatory Visit: Payer: Self-pay

## 2018-06-02 ENCOUNTER — Telehealth: Payer: Self-pay

## 2018-06-02 DIAGNOSIS — Z992 Dependence on renal dialysis: Secondary | ICD-10-CM | POA: Diagnosis not present

## 2018-06-02 DIAGNOSIS — E1122 Type 2 diabetes mellitus with diabetic chronic kidney disease: Secondary | ICD-10-CM | POA: Diagnosis not present

## 2018-06-02 DIAGNOSIS — Z794 Long term (current) use of insulin: Secondary | ICD-10-CM | POA: Diagnosis not present

## 2018-06-02 DIAGNOSIS — Z87891 Personal history of nicotine dependence: Secondary | ICD-10-CM | POA: Diagnosis not present

## 2018-06-02 DIAGNOSIS — N186 End stage renal disease: Secondary | ICD-10-CM | POA: Diagnosis not present

## 2018-06-02 DIAGNOSIS — T8571XA Infection and inflammatory reaction due to peritoneal dialysis catheter, initial encounter: Secondary | ICD-10-CM | POA: Diagnosis not present

## 2018-06-02 DIAGNOSIS — D631 Anemia in chronic kidney disease: Secondary | ICD-10-CM | POA: Diagnosis not present

## 2018-06-02 DIAGNOSIS — I120 Hypertensive chronic kidney disease with stage 5 chronic kidney disease or end stage renal disease: Secondary | ICD-10-CM | POA: Diagnosis not present

## 2018-06-02 DIAGNOSIS — K652 Spontaneous bacterial peritonitis: Secondary | ICD-10-CM | POA: Diagnosis not present

## 2018-06-02 DIAGNOSIS — Z792 Long term (current) use of antibiotics: Secondary | ICD-10-CM | POA: Diagnosis not present

## 2018-06-02 MED ORDER — CARVEDILOL 6.25 MG PO TABS: 6.2500 mg | ORAL_TABLET | Freq: Two times a day (BID) | ORAL | 1 refills | Status: DC

## 2018-06-02 NOTE — Telephone Encounter (Signed)
Jake Gomez from [facility]- 1 time for 1 week, 2 weeks for 2 weeks and 1 time a week for 1 week- total of 6 visits. B/P 210/100 and 212/100. Pt had not taken his carvedilol prior to visit. He took it, came down to 188/80. Notified pt to increase to BID on carvedilol. "Okay" to visits. Still monitor b/p. [phone]

## 2018-06-03 DIAGNOSIS — Z992 Dependence on renal dialysis: Secondary | ICD-10-CM | POA: Diagnosis not present

## 2018-06-03 DIAGNOSIS — N186 End stage renal disease: Secondary | ICD-10-CM | POA: Diagnosis not present

## 2018-06-03 DIAGNOSIS — D631 Anemia in chronic kidney disease: Secondary | ICD-10-CM | POA: Diagnosis not present

## 2018-06-04 DIAGNOSIS — D631 Anemia in chronic kidney disease: Secondary | ICD-10-CM | POA: Diagnosis not present

## 2018-06-04 DIAGNOSIS — Z992 Dependence on renal dialysis: Secondary | ICD-10-CM | POA: Diagnosis not present

## 2018-06-04 DIAGNOSIS — N186 End stage renal disease: Secondary | ICD-10-CM | POA: Diagnosis not present

## 2018-06-05 DIAGNOSIS — N186 End stage renal disease: Secondary | ICD-10-CM | POA: Diagnosis not present

## 2018-06-05 DIAGNOSIS — Z992 Dependence on renal dialysis: Secondary | ICD-10-CM | POA: Diagnosis not present

## 2018-06-05 DIAGNOSIS — D631 Anemia in chronic kidney disease: Secondary | ICD-10-CM | POA: Diagnosis not present

## 2018-06-06 DIAGNOSIS — N186 End stage renal disease: Secondary | ICD-10-CM | POA: Diagnosis not present

## 2018-06-06 DIAGNOSIS — E1122 Type 2 diabetes mellitus with diabetic chronic kidney disease: Secondary | ICD-10-CM | POA: Diagnosis not present

## 2018-06-06 DIAGNOSIS — K652 Spontaneous bacterial peritonitis: Secondary | ICD-10-CM | POA: Diagnosis not present

## 2018-06-06 DIAGNOSIS — D631 Anemia in chronic kidney disease: Secondary | ICD-10-CM | POA: Diagnosis not present

## 2018-06-06 DIAGNOSIS — T8571XA Infection and inflammatory reaction due to peritoneal dialysis catheter, initial encounter: Secondary | ICD-10-CM | POA: Diagnosis not present

## 2018-06-06 DIAGNOSIS — I120 Hypertensive chronic kidney disease with stage 5 chronic kidney disease or end stage renal disease: Secondary | ICD-10-CM | POA: Diagnosis not present

## 2018-06-06 DIAGNOSIS — Z992 Dependence on renal dialysis: Secondary | ICD-10-CM | POA: Diagnosis not present

## 2018-06-07 DIAGNOSIS — N186 End stage renal disease: Secondary | ICD-10-CM | POA: Diagnosis not present

## 2018-06-07 DIAGNOSIS — D631 Anemia in chronic kidney disease: Secondary | ICD-10-CM | POA: Diagnosis not present

## 2018-06-07 DIAGNOSIS — Z992 Dependence on renal dialysis: Secondary | ICD-10-CM | POA: Diagnosis not present

## 2018-06-08 DIAGNOSIS — I120 Hypertensive chronic kidney disease with stage 5 chronic kidney disease or end stage renal disease: Secondary | ICD-10-CM | POA: Diagnosis not present

## 2018-06-08 DIAGNOSIS — K652 Spontaneous bacterial peritonitis: Secondary | ICD-10-CM | POA: Diagnosis not present

## 2018-06-08 DIAGNOSIS — N186 End stage renal disease: Secondary | ICD-10-CM | POA: Diagnosis not present

## 2018-06-08 DIAGNOSIS — Z992 Dependence on renal dialysis: Secondary | ICD-10-CM | POA: Diagnosis not present

## 2018-06-08 DIAGNOSIS — D631 Anemia in chronic kidney disease: Secondary | ICD-10-CM | POA: Diagnosis not present

## 2018-06-08 DIAGNOSIS — E1122 Type 2 diabetes mellitus with diabetic chronic kidney disease: Secondary | ICD-10-CM | POA: Diagnosis not present

## 2018-06-08 DIAGNOSIS — T8571XA Infection and inflammatory reaction due to peritoneal dialysis catheter, initial encounter: Secondary | ICD-10-CM | POA: Diagnosis not present

## 2018-06-09 DIAGNOSIS — Z992 Dependence on renal dialysis: Secondary | ICD-10-CM | POA: Diagnosis not present

## 2018-06-09 DIAGNOSIS — N186 End stage renal disease: Secondary | ICD-10-CM | POA: Diagnosis not present

## 2018-06-09 DIAGNOSIS — D631 Anemia in chronic kidney disease: Secondary | ICD-10-CM | POA: Diagnosis not present

## 2018-06-10 DIAGNOSIS — D631 Anemia in chronic kidney disease: Secondary | ICD-10-CM | POA: Diagnosis not present

## 2018-06-10 DIAGNOSIS — Z992 Dependence on renal dialysis: Secondary | ICD-10-CM | POA: Diagnosis not present

## 2018-06-10 DIAGNOSIS — N186 End stage renal disease: Secondary | ICD-10-CM | POA: Diagnosis not present

## 2018-06-11 DIAGNOSIS — Z992 Dependence on renal dialysis: Secondary | ICD-10-CM | POA: Diagnosis not present

## 2018-06-11 DIAGNOSIS — N186 End stage renal disease: Secondary | ICD-10-CM | POA: Diagnosis not present

## 2018-06-11 DIAGNOSIS — D631 Anemia in chronic kidney disease: Secondary | ICD-10-CM | POA: Diagnosis not present

## 2018-06-12 DIAGNOSIS — T8571XA Infection and inflammatory reaction due to peritoneal dialysis catheter, initial encounter: Secondary | ICD-10-CM | POA: Diagnosis not present

## 2018-06-12 DIAGNOSIS — E1122 Type 2 diabetes mellitus with diabetic chronic kidney disease: Secondary | ICD-10-CM | POA: Diagnosis not present

## 2018-06-12 DIAGNOSIS — I120 Hypertensive chronic kidney disease with stage 5 chronic kidney disease or end stage renal disease: Secondary | ICD-10-CM | POA: Diagnosis not present

## 2018-06-12 DIAGNOSIS — K652 Spontaneous bacterial peritonitis: Secondary | ICD-10-CM | POA: Diagnosis not present

## 2018-06-12 DIAGNOSIS — N186 End stage renal disease: Secondary | ICD-10-CM | POA: Diagnosis not present

## 2018-06-12 DIAGNOSIS — D631 Anemia in chronic kidney disease: Secondary | ICD-10-CM | POA: Diagnosis not present

## 2018-06-12 DIAGNOSIS — Z992 Dependence on renal dialysis: Secondary | ICD-10-CM | POA: Diagnosis not present

## 2018-06-13 DIAGNOSIS — Z992 Dependence on renal dialysis: Secondary | ICD-10-CM | POA: Diagnosis not present

## 2018-06-13 DIAGNOSIS — N186 End stage renal disease: Secondary | ICD-10-CM | POA: Diagnosis not present

## 2018-06-13 DIAGNOSIS — D631 Anemia in chronic kidney disease: Secondary | ICD-10-CM | POA: Diagnosis not present

## 2018-06-14 DIAGNOSIS — Z298 Encounter for other specified prophylactic measures: Secondary | ICD-10-CM | POA: Diagnosis not present

## 2018-06-14 DIAGNOSIS — D631 Anemia in chronic kidney disease: Secondary | ICD-10-CM | POA: Diagnosis not present

## 2018-06-14 DIAGNOSIS — N186 End stage renal disease: Secondary | ICD-10-CM | POA: Diagnosis not present

## 2018-06-14 DIAGNOSIS — D509 Iron deficiency anemia, unspecified: Secondary | ICD-10-CM | POA: Diagnosis not present

## 2018-06-14 DIAGNOSIS — Z992 Dependence on renal dialysis: Secondary | ICD-10-CM | POA: Diagnosis not present

## 2018-06-14 DIAGNOSIS — N2581 Secondary hyperparathyroidism of renal origin: Secondary | ICD-10-CM | POA: Diagnosis not present

## 2018-06-15 DIAGNOSIS — Z298 Encounter for other specified prophylactic measures: Secondary | ICD-10-CM | POA: Diagnosis not present

## 2018-06-15 DIAGNOSIS — E119 Type 2 diabetes mellitus without complications: Secondary | ICD-10-CM | POA: Diagnosis not present

## 2018-06-15 DIAGNOSIS — D509 Iron deficiency anemia, unspecified: Secondary | ICD-10-CM | POA: Diagnosis not present

## 2018-06-15 DIAGNOSIS — Z992 Dependence on renal dialysis: Secondary | ICD-10-CM | POA: Diagnosis not present

## 2018-06-15 DIAGNOSIS — N186 End stage renal disease: Secondary | ICD-10-CM | POA: Diagnosis not present

## 2018-06-15 DIAGNOSIS — D631 Anemia in chronic kidney disease: Secondary | ICD-10-CM | POA: Diagnosis not present

## 2018-06-15 DIAGNOSIS — N2581 Secondary hyperparathyroidism of renal origin: Secondary | ICD-10-CM | POA: Diagnosis not present

## 2018-06-16 DIAGNOSIS — D631 Anemia in chronic kidney disease: Secondary | ICD-10-CM | POA: Diagnosis not present

## 2018-06-16 DIAGNOSIS — N2581 Secondary hyperparathyroidism of renal origin: Secondary | ICD-10-CM | POA: Diagnosis not present

## 2018-06-16 DIAGNOSIS — N186 End stage renal disease: Secondary | ICD-10-CM | POA: Diagnosis not present

## 2018-06-16 DIAGNOSIS — Z298 Encounter for other specified prophylactic measures: Secondary | ICD-10-CM | POA: Diagnosis not present

## 2018-06-16 DIAGNOSIS — D509 Iron deficiency anemia, unspecified: Secondary | ICD-10-CM | POA: Diagnosis not present

## 2018-06-16 DIAGNOSIS — Z992 Dependence on renal dialysis: Secondary | ICD-10-CM | POA: Diagnosis not present

## 2018-06-17 DIAGNOSIS — Z298 Encounter for other specified prophylactic measures: Secondary | ICD-10-CM | POA: Diagnosis not present

## 2018-06-17 DIAGNOSIS — N2581 Secondary hyperparathyroidism of renal origin: Secondary | ICD-10-CM | POA: Diagnosis not present

## 2018-06-17 DIAGNOSIS — D631 Anemia in chronic kidney disease: Secondary | ICD-10-CM | POA: Diagnosis not present

## 2018-06-17 DIAGNOSIS — N186 End stage renal disease: Secondary | ICD-10-CM | POA: Diagnosis not present

## 2018-06-17 DIAGNOSIS — Z992 Dependence on renal dialysis: Secondary | ICD-10-CM | POA: Diagnosis not present

## 2018-06-17 DIAGNOSIS — D509 Iron deficiency anemia, unspecified: Secondary | ICD-10-CM | POA: Diagnosis not present

## 2018-06-18 DIAGNOSIS — D509 Iron deficiency anemia, unspecified: Secondary | ICD-10-CM | POA: Diagnosis not present

## 2018-06-18 DIAGNOSIS — Z298 Encounter for other specified prophylactic measures: Secondary | ICD-10-CM | POA: Diagnosis not present

## 2018-06-18 DIAGNOSIS — N2581 Secondary hyperparathyroidism of renal origin: Secondary | ICD-10-CM | POA: Diagnosis not present

## 2018-06-18 DIAGNOSIS — D631 Anemia in chronic kidney disease: Secondary | ICD-10-CM | POA: Diagnosis not present

## 2018-06-18 DIAGNOSIS — N186 End stage renal disease: Secondary | ICD-10-CM | POA: Diagnosis not present

## 2018-06-18 DIAGNOSIS — Z992 Dependence on renal dialysis: Secondary | ICD-10-CM | POA: Diagnosis not present

## 2018-06-19 ENCOUNTER — Ambulatory Visit: Payer: Medicare Other | Admitting: Family Medicine

## 2018-06-19 ENCOUNTER — Encounter: Payer: Self-pay | Admitting: Family Medicine

## 2018-06-19 DIAGNOSIS — Z992 Dependence on renal dialysis: Secondary | ICD-10-CM | POA: Diagnosis not present

## 2018-06-19 DIAGNOSIS — N2581 Secondary hyperparathyroidism of renal origin: Secondary | ICD-10-CM | POA: Diagnosis not present

## 2018-06-19 DIAGNOSIS — D631 Anemia in chronic kidney disease: Secondary | ICD-10-CM | POA: Diagnosis not present

## 2018-06-19 DIAGNOSIS — D509 Iron deficiency anemia, unspecified: Secondary | ICD-10-CM | POA: Diagnosis not present

## 2018-06-19 DIAGNOSIS — Z298 Encounter for other specified prophylactic measures: Secondary | ICD-10-CM | POA: Diagnosis not present

## 2018-06-19 DIAGNOSIS — N186 End stage renal disease: Secondary | ICD-10-CM | POA: Diagnosis not present

## 2018-06-20 DIAGNOSIS — Z298 Encounter for other specified prophylactic measures: Secondary | ICD-10-CM | POA: Diagnosis not present

## 2018-06-20 DIAGNOSIS — Z992 Dependence on renal dialysis: Secondary | ICD-10-CM | POA: Diagnosis not present

## 2018-06-20 DIAGNOSIS — N186 End stage renal disease: Secondary | ICD-10-CM | POA: Diagnosis not present

## 2018-06-20 DIAGNOSIS — N2581 Secondary hyperparathyroidism of renal origin: Secondary | ICD-10-CM | POA: Diagnosis not present

## 2018-06-20 DIAGNOSIS — D631 Anemia in chronic kidney disease: Secondary | ICD-10-CM | POA: Diagnosis not present

## 2018-06-20 DIAGNOSIS — D509 Iron deficiency anemia, unspecified: Secondary | ICD-10-CM | POA: Diagnosis not present

## 2018-06-21 DIAGNOSIS — Z992 Dependence on renal dialysis: Secondary | ICD-10-CM | POA: Diagnosis not present

## 2018-06-21 DIAGNOSIS — D631 Anemia in chronic kidney disease: Secondary | ICD-10-CM | POA: Diagnosis not present

## 2018-06-21 DIAGNOSIS — N186 End stage renal disease: Secondary | ICD-10-CM | POA: Diagnosis not present

## 2018-06-21 DIAGNOSIS — N2581 Secondary hyperparathyroidism of renal origin: Secondary | ICD-10-CM | POA: Diagnosis not present

## 2018-06-21 DIAGNOSIS — D509 Iron deficiency anemia, unspecified: Secondary | ICD-10-CM | POA: Diagnosis not present

## 2018-06-21 DIAGNOSIS — Z298 Encounter for other specified prophylactic measures: Secondary | ICD-10-CM | POA: Diagnosis not present

## 2018-06-22 DIAGNOSIS — N2581 Secondary hyperparathyroidism of renal origin: Secondary | ICD-10-CM | POA: Diagnosis not present

## 2018-06-22 DIAGNOSIS — Z298 Encounter for other specified prophylactic measures: Secondary | ICD-10-CM | POA: Diagnosis not present

## 2018-06-22 DIAGNOSIS — D631 Anemia in chronic kidney disease: Secondary | ICD-10-CM | POA: Diagnosis not present

## 2018-06-22 DIAGNOSIS — D509 Iron deficiency anemia, unspecified: Secondary | ICD-10-CM | POA: Diagnosis not present

## 2018-06-22 DIAGNOSIS — N186 End stage renal disease: Secondary | ICD-10-CM | POA: Diagnosis not present

## 2018-06-22 DIAGNOSIS — Z992 Dependence on renal dialysis: Secondary | ICD-10-CM | POA: Diagnosis not present

## 2018-06-23 DIAGNOSIS — N2581 Secondary hyperparathyroidism of renal origin: Secondary | ICD-10-CM | POA: Diagnosis not present

## 2018-06-23 DIAGNOSIS — D631 Anemia in chronic kidney disease: Secondary | ICD-10-CM | POA: Diagnosis not present

## 2018-06-23 DIAGNOSIS — Z992 Dependence on renal dialysis: Secondary | ICD-10-CM | POA: Diagnosis not present

## 2018-06-23 DIAGNOSIS — N186 End stage renal disease: Secondary | ICD-10-CM | POA: Diagnosis not present

## 2018-06-23 DIAGNOSIS — D509 Iron deficiency anemia, unspecified: Secondary | ICD-10-CM | POA: Diagnosis not present

## 2018-06-23 DIAGNOSIS — Z298 Encounter for other specified prophylactic measures: Secondary | ICD-10-CM | POA: Diagnosis not present

## 2018-06-24 DIAGNOSIS — D631 Anemia in chronic kidney disease: Secondary | ICD-10-CM | POA: Diagnosis not present

## 2018-06-24 DIAGNOSIS — N186 End stage renal disease: Secondary | ICD-10-CM | POA: Diagnosis not present

## 2018-06-24 DIAGNOSIS — N2581 Secondary hyperparathyroidism of renal origin: Secondary | ICD-10-CM | POA: Diagnosis not present

## 2018-06-24 DIAGNOSIS — Z298 Encounter for other specified prophylactic measures: Secondary | ICD-10-CM | POA: Diagnosis not present

## 2018-06-24 DIAGNOSIS — D509 Iron deficiency anemia, unspecified: Secondary | ICD-10-CM | POA: Diagnosis not present

## 2018-06-24 DIAGNOSIS — Z992 Dependence on renal dialysis: Secondary | ICD-10-CM | POA: Diagnosis not present

## 2018-06-25 DIAGNOSIS — Z992 Dependence on renal dialysis: Secondary | ICD-10-CM | POA: Diagnosis not present

## 2018-06-25 DIAGNOSIS — D631 Anemia in chronic kidney disease: Secondary | ICD-10-CM | POA: Diagnosis not present

## 2018-06-25 DIAGNOSIS — N2581 Secondary hyperparathyroidism of renal origin: Secondary | ICD-10-CM | POA: Diagnosis not present

## 2018-06-25 DIAGNOSIS — N186 End stage renal disease: Secondary | ICD-10-CM | POA: Diagnosis not present

## 2018-06-25 DIAGNOSIS — Z298 Encounter for other specified prophylactic measures: Secondary | ICD-10-CM | POA: Diagnosis not present

## 2018-06-25 DIAGNOSIS — D509 Iron deficiency anemia, unspecified: Secondary | ICD-10-CM | POA: Diagnosis not present

## 2018-06-26 ENCOUNTER — Other Ambulatory Visit: Payer: Self-pay | Admitting: Family Medicine

## 2018-06-26 DIAGNOSIS — E1129 Type 2 diabetes mellitus with other diabetic kidney complication: Secondary | ICD-10-CM

## 2018-06-26 DIAGNOSIS — N2581 Secondary hyperparathyroidism of renal origin: Secondary | ICD-10-CM | POA: Diagnosis not present

## 2018-06-26 DIAGNOSIS — Z992 Dependence on renal dialysis: Secondary | ICD-10-CM | POA: Diagnosis not present

## 2018-06-26 DIAGNOSIS — N186 End stage renal disease: Secondary | ICD-10-CM | POA: Diagnosis not present

## 2018-06-26 DIAGNOSIS — Z298 Encounter for other specified prophylactic measures: Secondary | ICD-10-CM | POA: Diagnosis not present

## 2018-06-26 DIAGNOSIS — D509 Iron deficiency anemia, unspecified: Secondary | ICD-10-CM | POA: Diagnosis not present

## 2018-06-26 DIAGNOSIS — D631 Anemia in chronic kidney disease: Secondary | ICD-10-CM | POA: Diagnosis not present

## 2018-06-26 DIAGNOSIS — R809 Proteinuria, unspecified: Principal | ICD-10-CM

## 2018-06-26 DIAGNOSIS — Z794 Long term (current) use of insulin: Principal | ICD-10-CM

## 2018-06-27 DIAGNOSIS — Z992 Dependence on renal dialysis: Secondary | ICD-10-CM | POA: Diagnosis not present

## 2018-06-27 DIAGNOSIS — D509 Iron deficiency anemia, unspecified: Secondary | ICD-10-CM | POA: Diagnosis not present

## 2018-06-27 DIAGNOSIS — N186 End stage renal disease: Secondary | ICD-10-CM | POA: Diagnosis not present

## 2018-06-27 DIAGNOSIS — N2581 Secondary hyperparathyroidism of renal origin: Secondary | ICD-10-CM | POA: Diagnosis not present

## 2018-06-27 DIAGNOSIS — D631 Anemia in chronic kidney disease: Secondary | ICD-10-CM | POA: Diagnosis not present

## 2018-06-27 DIAGNOSIS — Z298 Encounter for other specified prophylactic measures: Secondary | ICD-10-CM | POA: Diagnosis not present

## 2018-06-28 DIAGNOSIS — N186 End stage renal disease: Secondary | ICD-10-CM | POA: Diagnosis not present

## 2018-06-28 DIAGNOSIS — N2581 Secondary hyperparathyroidism of renal origin: Secondary | ICD-10-CM | POA: Diagnosis not present

## 2018-06-28 DIAGNOSIS — D631 Anemia in chronic kidney disease: Secondary | ICD-10-CM | POA: Diagnosis not present

## 2018-06-28 DIAGNOSIS — D509 Iron deficiency anemia, unspecified: Secondary | ICD-10-CM | POA: Diagnosis not present

## 2018-06-28 DIAGNOSIS — Z992 Dependence on renal dialysis: Secondary | ICD-10-CM | POA: Diagnosis not present

## 2018-06-28 DIAGNOSIS — Z298 Encounter for other specified prophylactic measures: Secondary | ICD-10-CM | POA: Diagnosis not present

## 2018-06-29 DIAGNOSIS — Z298 Encounter for other specified prophylactic measures: Secondary | ICD-10-CM | POA: Diagnosis not present

## 2018-06-29 DIAGNOSIS — N2581 Secondary hyperparathyroidism of renal origin: Secondary | ICD-10-CM | POA: Diagnosis not present

## 2018-06-29 DIAGNOSIS — Z992 Dependence on renal dialysis: Secondary | ICD-10-CM | POA: Diagnosis not present

## 2018-06-29 DIAGNOSIS — N186 End stage renal disease: Secondary | ICD-10-CM | POA: Diagnosis not present

## 2018-06-29 DIAGNOSIS — D631 Anemia in chronic kidney disease: Secondary | ICD-10-CM | POA: Diagnosis not present

## 2018-06-29 DIAGNOSIS — D509 Iron deficiency anemia, unspecified: Secondary | ICD-10-CM | POA: Diagnosis not present

## 2018-06-30 DIAGNOSIS — Z298 Encounter for other specified prophylactic measures: Secondary | ICD-10-CM | POA: Diagnosis not present

## 2018-06-30 DIAGNOSIS — N2581 Secondary hyperparathyroidism of renal origin: Secondary | ICD-10-CM | POA: Diagnosis not present

## 2018-06-30 DIAGNOSIS — N186 End stage renal disease: Secondary | ICD-10-CM | POA: Diagnosis not present

## 2018-06-30 DIAGNOSIS — D631 Anemia in chronic kidney disease: Secondary | ICD-10-CM | POA: Diagnosis not present

## 2018-06-30 DIAGNOSIS — D509 Iron deficiency anemia, unspecified: Secondary | ICD-10-CM | POA: Diagnosis not present

## 2018-06-30 DIAGNOSIS — Z992 Dependence on renal dialysis: Secondary | ICD-10-CM | POA: Diagnosis not present

## 2018-07-01 DIAGNOSIS — D509 Iron deficiency anemia, unspecified: Secondary | ICD-10-CM | POA: Diagnosis not present

## 2018-07-01 DIAGNOSIS — Z992 Dependence on renal dialysis: Secondary | ICD-10-CM | POA: Diagnosis not present

## 2018-07-01 DIAGNOSIS — D631 Anemia in chronic kidney disease: Secondary | ICD-10-CM | POA: Diagnosis not present

## 2018-07-01 DIAGNOSIS — N2581 Secondary hyperparathyroidism of renal origin: Secondary | ICD-10-CM | POA: Diagnosis not present

## 2018-07-01 DIAGNOSIS — Z298 Encounter for other specified prophylactic measures: Secondary | ICD-10-CM | POA: Diagnosis not present

## 2018-07-01 DIAGNOSIS — N186 End stage renal disease: Secondary | ICD-10-CM | POA: Diagnosis not present

## 2018-07-02 DIAGNOSIS — N186 End stage renal disease: Secondary | ICD-10-CM | POA: Diagnosis not present

## 2018-07-02 DIAGNOSIS — D509 Iron deficiency anemia, unspecified: Secondary | ICD-10-CM | POA: Diagnosis not present

## 2018-07-02 DIAGNOSIS — Z992 Dependence on renal dialysis: Secondary | ICD-10-CM | POA: Diagnosis not present

## 2018-07-02 DIAGNOSIS — N2581 Secondary hyperparathyroidism of renal origin: Secondary | ICD-10-CM | POA: Diagnosis not present

## 2018-07-02 DIAGNOSIS — Z298 Encounter for other specified prophylactic measures: Secondary | ICD-10-CM | POA: Diagnosis not present

## 2018-07-02 DIAGNOSIS — D631 Anemia in chronic kidney disease: Secondary | ICD-10-CM | POA: Diagnosis not present

## 2018-07-03 DIAGNOSIS — Z298 Encounter for other specified prophylactic measures: Secondary | ICD-10-CM | POA: Diagnosis not present

## 2018-07-03 DIAGNOSIS — N186 End stage renal disease: Secondary | ICD-10-CM | POA: Diagnosis not present

## 2018-07-03 DIAGNOSIS — N2581 Secondary hyperparathyroidism of renal origin: Secondary | ICD-10-CM | POA: Diagnosis not present

## 2018-07-03 DIAGNOSIS — Z992 Dependence on renal dialysis: Secondary | ICD-10-CM | POA: Diagnosis not present

## 2018-07-03 DIAGNOSIS — D631 Anemia in chronic kidney disease: Secondary | ICD-10-CM | POA: Diagnosis not present

## 2018-07-03 DIAGNOSIS — D509 Iron deficiency anemia, unspecified: Secondary | ICD-10-CM | POA: Diagnosis not present

## 2018-07-04 DIAGNOSIS — Z992 Dependence on renal dialysis: Secondary | ICD-10-CM | POA: Diagnosis not present

## 2018-07-04 DIAGNOSIS — D631 Anemia in chronic kidney disease: Secondary | ICD-10-CM | POA: Diagnosis not present

## 2018-07-04 DIAGNOSIS — N2581 Secondary hyperparathyroidism of renal origin: Secondary | ICD-10-CM | POA: Diagnosis not present

## 2018-07-04 DIAGNOSIS — D509 Iron deficiency anemia, unspecified: Secondary | ICD-10-CM | POA: Diagnosis not present

## 2018-07-04 DIAGNOSIS — N186 End stage renal disease: Secondary | ICD-10-CM | POA: Diagnosis not present

## 2018-07-04 DIAGNOSIS — Z298 Encounter for other specified prophylactic measures: Secondary | ICD-10-CM | POA: Diagnosis not present

## 2018-07-05 DIAGNOSIS — D631 Anemia in chronic kidney disease: Secondary | ICD-10-CM | POA: Diagnosis not present

## 2018-07-05 DIAGNOSIS — Z992 Dependence on renal dialysis: Secondary | ICD-10-CM | POA: Diagnosis not present

## 2018-07-05 DIAGNOSIS — N186 End stage renal disease: Secondary | ICD-10-CM | POA: Diagnosis not present

## 2018-07-05 DIAGNOSIS — N2581 Secondary hyperparathyroidism of renal origin: Secondary | ICD-10-CM | POA: Diagnosis not present

## 2018-07-05 DIAGNOSIS — Z298 Encounter for other specified prophylactic measures: Secondary | ICD-10-CM | POA: Diagnosis not present

## 2018-07-05 DIAGNOSIS — D509 Iron deficiency anemia, unspecified: Secondary | ICD-10-CM | POA: Diagnosis not present

## 2018-07-06 DIAGNOSIS — Z992 Dependence on renal dialysis: Secondary | ICD-10-CM | POA: Diagnosis not present

## 2018-07-06 DIAGNOSIS — N2581 Secondary hyperparathyroidism of renal origin: Secondary | ICD-10-CM | POA: Diagnosis not present

## 2018-07-06 DIAGNOSIS — Z298 Encounter for other specified prophylactic measures: Secondary | ICD-10-CM | POA: Diagnosis not present

## 2018-07-06 DIAGNOSIS — D631 Anemia in chronic kidney disease: Secondary | ICD-10-CM | POA: Diagnosis not present

## 2018-07-06 DIAGNOSIS — D509 Iron deficiency anemia, unspecified: Secondary | ICD-10-CM | POA: Diagnosis not present

## 2018-07-06 DIAGNOSIS — N186 End stage renal disease: Secondary | ICD-10-CM | POA: Diagnosis not present

## 2018-07-07 DIAGNOSIS — N186 End stage renal disease: Secondary | ICD-10-CM | POA: Diagnosis not present

## 2018-07-07 DIAGNOSIS — Z298 Encounter for other specified prophylactic measures: Secondary | ICD-10-CM | POA: Diagnosis not present

## 2018-07-07 DIAGNOSIS — Z992 Dependence on renal dialysis: Secondary | ICD-10-CM | POA: Diagnosis not present

## 2018-07-07 DIAGNOSIS — D509 Iron deficiency anemia, unspecified: Secondary | ICD-10-CM | POA: Diagnosis not present

## 2018-07-07 DIAGNOSIS — D631 Anemia in chronic kidney disease: Secondary | ICD-10-CM | POA: Diagnosis not present

## 2018-07-07 DIAGNOSIS — N2581 Secondary hyperparathyroidism of renal origin: Secondary | ICD-10-CM | POA: Diagnosis not present

## 2018-07-08 DIAGNOSIS — Z298 Encounter for other specified prophylactic measures: Secondary | ICD-10-CM | POA: Diagnosis not present

## 2018-07-08 DIAGNOSIS — N2581 Secondary hyperparathyroidism of renal origin: Secondary | ICD-10-CM | POA: Diagnosis not present

## 2018-07-08 DIAGNOSIS — D509 Iron deficiency anemia, unspecified: Secondary | ICD-10-CM | POA: Diagnosis not present

## 2018-07-08 DIAGNOSIS — D631 Anemia in chronic kidney disease: Secondary | ICD-10-CM | POA: Diagnosis not present

## 2018-07-08 DIAGNOSIS — N186 End stage renal disease: Secondary | ICD-10-CM | POA: Diagnosis not present

## 2018-07-08 DIAGNOSIS — Z992 Dependence on renal dialysis: Secondary | ICD-10-CM | POA: Diagnosis not present

## 2018-07-09 DIAGNOSIS — N186 End stage renal disease: Secondary | ICD-10-CM | POA: Diagnosis not present

## 2018-07-09 DIAGNOSIS — D631 Anemia in chronic kidney disease: Secondary | ICD-10-CM | POA: Diagnosis not present

## 2018-07-09 DIAGNOSIS — Z992 Dependence on renal dialysis: Secondary | ICD-10-CM | POA: Diagnosis not present

## 2018-07-09 DIAGNOSIS — D509 Iron deficiency anemia, unspecified: Secondary | ICD-10-CM | POA: Diagnosis not present

## 2018-07-09 DIAGNOSIS — N2581 Secondary hyperparathyroidism of renal origin: Secondary | ICD-10-CM | POA: Diagnosis not present

## 2018-07-09 DIAGNOSIS — Z298 Encounter for other specified prophylactic measures: Secondary | ICD-10-CM | POA: Diagnosis not present

## 2018-07-10 ENCOUNTER — Encounter: Payer: Self-pay | Admitting: Emergency Medicine

## 2018-07-10 ENCOUNTER — Other Ambulatory Visit: Payer: Self-pay

## 2018-07-10 ENCOUNTER — Inpatient Hospital Stay
Admission: EM | Admit: 2018-07-10 | Discharge: 2018-07-22 | DRG: 907 | Disposition: A | Discharge status: Home Health | Payer: Medicare Other | Attending: Internal Medicine | Admitting: Internal Medicine

## 2018-07-10 DIAGNOSIS — Z818 Family history of other mental and behavioral disorders: Secondary | ICD-10-CM

## 2018-07-10 DIAGNOSIS — Z992 Dependence on renal dialysis: Secondary | ICD-10-CM | POA: Diagnosis not present

## 2018-07-10 DIAGNOSIS — Z8249 Family history of ischemic heart disease and other diseases of the circulatory system: Secondary | ICD-10-CM

## 2018-07-10 DIAGNOSIS — Z823 Family history of stroke: Secondary | ICD-10-CM

## 2018-07-10 DIAGNOSIS — Z794 Long term (current) use of insulin: Secondary | ICD-10-CM

## 2018-07-10 DIAGNOSIS — Z792 Long term (current) use of antibiotics: Secondary | ICD-10-CM

## 2018-07-10 DIAGNOSIS — T8571XA Infection and inflammatory reaction due to peritoneal dialysis catheter, initial encounter: Secondary | ICD-10-CM | POA: Diagnosis not present

## 2018-07-10 DIAGNOSIS — Z79899 Other long term (current) drug therapy: Secondary | ICD-10-CM

## 2018-07-10 DIAGNOSIS — K5900 Constipation, unspecified: Secondary | ICD-10-CM | POA: Diagnosis present

## 2018-07-10 DIAGNOSIS — Z7951 Long term (current) use of inhaled steroids: Secondary | ICD-10-CM

## 2018-07-10 DIAGNOSIS — E1122 Type 2 diabetes mellitus with diabetic chronic kidney disease: Secondary | ICD-10-CM | POA: Diagnosis present

## 2018-07-10 DIAGNOSIS — D509 Iron deficiency anemia, unspecified: Secondary | ICD-10-CM | POA: Diagnosis not present

## 2018-07-10 DIAGNOSIS — N2581 Secondary hyperparathyroidism of renal origin: Secondary | ICD-10-CM | POA: Diagnosis present

## 2018-07-10 DIAGNOSIS — K650 Generalized (acute) peritonitis: Secondary | ICD-10-CM | POA: Diagnosis present

## 2018-07-10 DIAGNOSIS — K658 Other peritonitis: Secondary | ICD-10-CM | POA: Diagnosis not present

## 2018-07-10 DIAGNOSIS — Y838 Other surgical procedures as the cause of abnormal reaction of the patient, or of later complication, without mention of misadventure at the time of the procedure: Secondary | ICD-10-CM | POA: Diagnosis present

## 2018-07-10 DIAGNOSIS — K652 Spontaneous bacterial peritonitis: Secondary | ICD-10-CM | POA: Diagnosis present

## 2018-07-10 DIAGNOSIS — D631 Anemia in chronic kidney disease: Secondary | ICD-10-CM | POA: Diagnosis not present

## 2018-07-10 DIAGNOSIS — Z87891 Personal history of nicotine dependence: Secondary | ICD-10-CM

## 2018-07-10 DIAGNOSIS — N186 End stage renal disease: Secondary | ICD-10-CM | POA: Diagnosis not present

## 2018-07-10 DIAGNOSIS — F329 Major depressive disorder, single episode, unspecified: Secondary | ICD-10-CM | POA: Diagnosis present

## 2018-07-10 DIAGNOSIS — E669 Obesity, unspecified: Secondary | ICD-10-CM | POA: Diagnosis present

## 2018-07-10 DIAGNOSIS — E785 Hyperlipidemia, unspecified: Secondary | ICD-10-CM | POA: Diagnosis present

## 2018-07-10 DIAGNOSIS — B965 Pseudomonas (aeruginosa) (mallei) (pseudomallei) as the cause of diseases classified elsewhere: Secondary | ICD-10-CM | POA: Diagnosis present

## 2018-07-10 DIAGNOSIS — I120 Hypertensive chronic kidney disease with stage 5 chronic kidney disease or end stage renal disease: Secondary | ICD-10-CM | POA: Diagnosis present

## 2018-07-10 DIAGNOSIS — R197 Diarrhea, unspecified: Secondary | ICD-10-CM | POA: Diagnosis present

## 2018-07-10 DIAGNOSIS — A419 Sepsis, unspecified organism: Secondary | ICD-10-CM | POA: Diagnosis not present

## 2018-07-10 DIAGNOSIS — Z8349 Family history of other endocrine, nutritional and metabolic diseases: Secondary | ICD-10-CM

## 2018-07-10 DIAGNOSIS — Z809 Family history of malignant neoplasm, unspecified: Secondary | ICD-10-CM

## 2018-07-10 DIAGNOSIS — Z833 Family history of diabetes mellitus: Secondary | ICD-10-CM

## 2018-07-10 DIAGNOSIS — H6982 Other specified disorders of Eustachian tube, left ear: Secondary | ICD-10-CM

## 2018-07-10 DIAGNOSIS — Z298 Encounter for other specified prophylactic measures: Secondary | ICD-10-CM | POA: Diagnosis not present

## 2018-07-10 DIAGNOSIS — Z6838 Body mass index (BMI) 38.0-38.9, adult: Secondary | ICD-10-CM

## 2018-07-10 DIAGNOSIS — I160 Hypertensive urgency: Secondary | ICD-10-CM | POA: Diagnosis not present

## 2018-07-10 DIAGNOSIS — Z79891 Long term (current) use of opiate analgesic: Secondary | ICD-10-CM

## 2018-07-10 LAB — COMPREHENSIVE METABOLIC PANEL
ALT: 56 U/L — ABNORMAL HIGH (ref 0–44)
AST: 35 U/L (ref 15–41)
Albumin: 3.5 g/dL (ref 3.5–5.0)
Alkaline Phosphatase: 206 U/L — ABNORMAL HIGH (ref 38–126)
Anion gap: 11 (ref 5–15)
BUN: 56 mg/dL — ABNORMAL HIGH (ref 6–20)
CO2: 25 mmol/L (ref 22–32)
Calcium: 9.6 mg/dL (ref 8.9–10.3)
Chloride: 96 mmol/L — ABNORMAL LOW (ref 98–111)
Creatinine, Ser: 9.89 mg/dL — ABNORMAL HIGH (ref 0.61–1.24)
GFR calc Af Amer: 7 mL/min — ABNORMAL LOW (ref >60)
GFR calc non Af Amer: 6 mL/min — ABNORMAL LOW (ref >60)
Glucose, Bld: 349 mg/dL — ABNORMAL HIGH (ref 70–99)
Potassium: 4.6 mmol/L (ref 3.5–5.1)
Sodium: 132 mmol/L — ABNORMAL LOW (ref 135–145)
Total Bilirubin: 0.8 mg/dL (ref 0.3–1.2)
Total Protein: 7.9 g/dL (ref 6.5–8.1)

## 2018-07-10 LAB — CBC WITH DIFFERENTIAL/PLATELET
Abs Immature Granulocytes: 0.08 10*3/uL — ABNORMAL HIGH (ref 0.00–0.07)
Basophils Absolute: 0 10*3/uL (ref 0.0–0.1)
Basophils Relative: 0 %
Eosinophils Absolute: 0 10*3/uL (ref 0.0–0.5)
Eosinophils Relative: 0 %
HCT: 34.4 % — ABNORMAL LOW (ref 39.0–52.0)
Hemoglobin: 11.2 g/dL — ABNORMAL LOW (ref 13.0–17.0)
Immature Granulocytes: 1 %
Lymphocytes Relative: 8 %
Lymphs Abs: 1.1 10*3/uL (ref 0.7–4.0)
MCH: 31.8 pg (ref 26.0–34.0)
MCHC: 32.6 g/dL (ref 30.0–36.0)
MCV: 97.7 fL (ref 80.0–100.0)
Monocytes Absolute: 0.4 10*3/uL (ref 0.1–1.0)
Monocytes Relative: 3 %
Neutro Abs: 11.2 10*3/uL — ABNORMAL HIGH (ref 1.7–7.7)
Neutrophils Relative %: 88 %
Platelets: 177 10*3/uL (ref 150–400)
RBC: 3.52 MIL/uL — ABNORMAL LOW (ref 4.22–5.81)
RDW: 14.8 % (ref 11.5–15.5)
WBC: 12.7 10*3/uL — ABNORMAL HIGH (ref 4.0–10.5)
nRBC: 0 % (ref 0.0–0.2)

## 2018-07-10 LAB — LIPASE, BLOOD: Lipase: 40 U/L (ref 11–51)

## 2018-07-10 NOTE — ED Triage Notes (Signed)
Pt to triage via w/c with no distress noted; Pt reports mid abd pain since this am; currently peritoneal dialysis; hosp recently for same for peritonitis

## 2018-07-10 NOTE — ED Provider Notes (Signed)
[facility] Emergency Department Provider Note  ____________________________________________   None    (approximate)  I have reviewed the triage vital signs and the nursing notes.   HISTORY  Chief Complaint Abdominal Pain   HPI Jake Gomez is a 48 y.o. male who presents to the emergency department for treatment and evaluation of abdominal pain since this morning. He is currently on peritoneal dialysis. He was admitted for similar symptoms in December. He has not had any issues with his dialysis since then. He feels that his abdomen is distended more than usual. He does not weigh daily, but feels he has gained weight. He denies fever or other symptoms of concern.   Past Medical History:  Diagnosis Date  . Diabetes mellitus without complication ([place])   . Hypertension   . Renal disorder     Patient Active Problem List   Diagnosis Date Noted  . SBP (spontaneous bacterial peritonitis) ([place]) 05/25/2018  . Intractable abdominal pain 05/02/2018  . Peritonitis ([place]) 05/02/2018  . Chest pain 05/26/2017  . ESRD on peritoneal dialysis ([place]) 05/26/2017  . Chronic renal disease, stage 4, severely decreased glomerular filtration rate between 15-29 mL/min/1.73 square meter ([place]) 01/30/2014  . Acute-on-chronic renal failure ([place]) 09/29/2013  . Diabetes mellitus ([place]) 09/29/2013  . Hyperkalemia, diminished renal excretion 09/29/2013  . Hypertension 09/29/2013    Past Surgical History:  Procedure Laterality Date  . peritonal dialysis      Prior to Admission medications   Medication Sig Start Date End Date Taking? Authorizing Provider  acetaminophen (TYLENOL) 500 MG tablet Take 500 mg by mouth every 6 (six) hours as needed for mild pain.     [provider]  atorvastatin (LIPITOR) 80 MG tablet Take 1 tablet (80 mg total) by mouth every morning. 05/15/18   Juline Patch, MD  calcium acetate (PHOSLO) 667 MG capsule Take 2,001 mg by mouth 3 (three) times  daily with meals.  05/27/17   [provider]  carvedilol (COREG) 6.25 MG tablet Take 1 tablet (6.25 mg total) by mouth 2 (two) times daily with a meal. 06/02/18   Juline Patch, MD  docusate sodium (COLACE) 100 MG capsule Take 1 capsule (100 mg total) by mouth 2 (two) times daily. 05/05/18   Vaughan Basta, MD  fluticasone (FLONASE) 50 MCG/ACT nasal spray Place 2 sprays into both nostrils daily. 05/05/18   Vaughan Basta, MD  folic acid (FOLVITE) 1 MG tablet Take 1 tablet (1 mg total) by mouth daily. 05/06/18   Vaughan Basta, MD  glipiZIDE (GLUCOTROL) 5 MG tablet TAKE 1 TABLET BY MOUTH ONCE DAILY BEFORE BREAKFAST 06/27/18   Juline Patch, MD  insulin regular (NOVOLIN R RELION) 100 units/mL injection Inject subcutaneously daily as directed on the following sliding scale: 2 units for blood glucose reading equal to or greater than 150 and 1 unit for each blood glucose reading of 50. 05/27/18   Bettey Costa, MD  Insulin Syringes, Disposable, U-100 1 ML MISC 1 each by Does not apply route daily. 12/29/17   Juline Patch, MD  irbesartan (AVAPRO) 300 MG tablet Take 300 mg by mouth at bedtime.  05/14/17   [provider]  polyethylene glycol (MIRALAX / GLYCOLAX) packet Take 17 g by mouth daily. 05/06/18   Vaughan Basta, MD  RELION INSULIN SYRINGE 1ML/31G 31G X 5/16" 1 ML MISC 1 EACH BY DOSE NOT APPLY ROUTE DAILY 12/29/17   [provider]  sertraline (ZOLOFT) 25 MG tablet Take 1 tablet (  25 mg total) by mouth daily. One half tablet a day for 10 days, then 1 a day 05/24/18   Juline Patch, MD  traMADol (ULTRAM) 50 MG tablet Take 1 tablet (50 mg total) by mouth every 6 (six) hours as needed for moderate pain. 05/27/18   Bettey Costa, MD  traZODone (DESYREL) 50 MG tablet Take 50 mg by mouth at bedtime. Dr Candiss Norse 01/09/18   [provider]  vancomycin 1,000 mg, cefTAZidime 1,500 mg, heparin 1,500 Units in dialysis solution 1.5% low-MG/low-CA 344  MOSM/L 3,000 mL 1000mg  vanc  1500mg  ceftazadime with PD Daily for 2 weeks 05/27/18   Bettey Costa, MD  vitamin B-12 1000 MCG tablet Take 1 tablet (1,000 mcg total) by mouth daily. 05/06/18   Vaughan Basta, MD    Allergies Patient has no known allergies.  Family History  Problem Relation Age of Onset  . Thyroid disease Mother   . Stroke Father   . Cancer Father   . Diabetes Father     Social History Social History   Tobacco Use  . Smoking status: Former Research scientist (life sciences)  . Smokeless tobacco: Former Systems developer    Quit date: 07/16/2009  Substance Use Topics  . Alcohol use: No    Frequency: Never  . Drug use: No    Review of Systems  Constitutional: No fever/chills Eyes: No visual changes. ENT: No sore throat. Cardiovascular: Denies chest pain. Respiratory: "Occasional" shortness of breath. Gastrointestinal: No abdominal pain.  No nausea, no vomiting.  No diarrhea.  No constipation. Genitourinary: Negative for dysuria. Musculoskeletal: Negative for back pain. Skin: Negative for rash. Neurological: Negative for headaches, focal weakness or numbness. ____________________________________________   PHYSICAL EXAM:  VITAL SIGNS: ED Triage Vitals  Enc Vitals Group     BP 07/10/18 2133 (!) 146/75     Pulse --      Resp 07/10/18 2133 18     Temp 07/10/18 2133 99.9 F (37.7 C)     Temp Source 07/10/18 2133 Oral     SpO2 07/10/18 2133 95 %     Weight 07/10/18 2133 256 lb (116.1 kg)     Height 07/10/18 2133 5\' 8"  (1.727 m)     Head Circumference --      Peak Flow --      Pain Score 07/10/18 2137 9     Pain Loc --      Pain Edu? --      Excl. in [place]? --     Constitutional: Alert and oriented. Well appearing and in no acute distress. Eyes: Conjunctivae are normal. Head: Atraumatic. Nose: No congestion/rhinnorhea. Mouth/Throat: Mucous membranes are moist.  Oropharynx non-erythematous. Neck: No stridor.   Cardiovascular: Normal rate, regular rhythm. Grossly normal heart  sounds.  Good peripheral circulation. Respiratory: Normal respiratory effort.  No retractions. Lungs CTAB. Gastrointestinal: Soft and extremely tender with ascites. Distention noted. Musculoskeletal: No lower extremity tenderness nor edema.  No joint effusions. Neurologic:  Normal speech and language. No gross focal neurologic deficits are appreciated. No gait instability. Skin:  Skin is warm, dry and intact. No rash noted. Skin around peritoneal dialysis site is clear and without surrounding erythema or drainage. Psychiatric: Mood and affect are normal. Speech and behavior are normal.  ____________________________________________   LABS (all labs ordered are listed, but only abnormal results are displayed)  Labs Reviewed  CBC WITH DIFFERENTIAL/PLATELET - Abnormal; Notable for the following components:      Result Value   WBC 12.7 (*)    RBC 3.52 (*)  Hemoglobin 11.2 (*)    HCT 34.4 (*)    Neutro Abs 11.2 (*)    Abs Immature Granulocytes 0.08 (*)    All other components within normal limits  COMPREHENSIVE METABOLIC PANEL - Abnormal; Notable for the following components:   Sodium 132 (*)    Chloride 96 (*)    Glucose, Bld 349 (*)    BUN 56 (*)    Creatinine, Ser 9.89 (*)    ALT 56 (*)    Alkaline Phosphatase 206 (*)    GFR calc non Af Amer 6 (*)    GFR calc Af Amer 7 (*)    All other components within normal limits  BODY FLUID CULTURE  LIPASE, BLOOD   ____________________________________________  EKG  Not indicated.  ____________________________________________  RADIOLOGY  ED MD interpretation:  Not indicated.  Official radiology report(s): No results found.  ____________________________________________   PROCEDURES  Procedure(s) performed: None  Procedures  Critical Care performed: No  ____________________________________________   INITIAL IMPRESSION / ASSESSMENT AND PLAN / ED COURSE  As part of my medical decision making, I reviewed the following  data within the electronic MEDICAL RECORD NUMBER Notes from prior ED visits    48 year old male presents to the emergency department for treatment and evaluation of abdominal pain. Pain is the same as previous when diagnosed with spontaneous bacterial peritonitis. He has had no known fever. No vomiting.   ----------------------------------------- 1:01 AM on 07/11/2018 -----------------------------------------  Patient will be admitted for antibiotic coverage.  Unable to obtain a specimen of the peritoneal fluid as the dialysis nurse is not currently here.  She states that someone will be here at about 6 AM that can then obtain the specimen.  He will be covered with Zosyn in the meantime.  He will be admitted to the service of the hospitalists.  Case was discussed with Dr. Jannifer Franklin.      ____________________________________________   FINAL CLINICAL IMPRESSION(S) / ED DIAGNOSES  Final diagnoses:  Spontaneous bacterial peritonitis [facility])     ED Discharge Orders    None       Note:  This document was prepared using Dragon voice recognition software and may include unintentional dictation errors.    Victorino Dike, FNP 07/11/18 0145    Gregor Hams, MD 07/11/18 [phone]

## 2018-07-11 ENCOUNTER — Telehealth (INDEPENDENT_AMBULATORY_CARE_PROVIDER_SITE_OTHER): Payer: Self-pay | Admitting: Vascular Surgery

## 2018-07-11 DIAGNOSIS — Z794 Long term (current) use of insulin: Secondary | ICD-10-CM | POA: Diagnosis not present

## 2018-07-11 DIAGNOSIS — K652 Spontaneous bacterial peritonitis: Secondary | ICD-10-CM | POA: Diagnosis not present

## 2018-07-11 DIAGNOSIS — A419 Sepsis, unspecified organism: Secondary | ICD-10-CM | POA: Diagnosis present

## 2018-07-11 DIAGNOSIS — K5900 Constipation, unspecified: Secondary | ICD-10-CM | POA: Diagnosis present

## 2018-07-11 DIAGNOSIS — B965 Pseudomonas (aeruginosa) (mallei) (pseudomallei) as the cause of diseases classified elsewhere: Secondary | ICD-10-CM | POA: Diagnosis not present

## 2018-07-11 DIAGNOSIS — Z87891 Personal history of nicotine dependence: Secondary | ICD-10-CM | POA: Diagnosis not present

## 2018-07-11 DIAGNOSIS — K658 Other peritonitis: Secondary | ICD-10-CM | POA: Diagnosis present

## 2018-07-11 DIAGNOSIS — E669 Obesity, unspecified: Secondary | ICD-10-CM | POA: Diagnosis present

## 2018-07-11 DIAGNOSIS — N2581 Secondary hyperparathyroidism of renal origin: Secondary | ICD-10-CM | POA: Diagnosis not present

## 2018-07-11 DIAGNOSIS — Z992 Dependence on renal dialysis: Secondary | ICD-10-CM | POA: Diagnosis not present

## 2018-07-11 DIAGNOSIS — Y838 Other surgical procedures as the cause of abnormal reaction of the patient, or of later complication, without mention of misadventure at the time of the procedure: Secondary | ICD-10-CM | POA: Diagnosis present

## 2018-07-11 DIAGNOSIS — T8571XA Infection and inflammatory reaction due to peritoneal dialysis catheter, initial encounter: Secondary | ICD-10-CM | POA: Diagnosis present

## 2018-07-11 DIAGNOSIS — Z833 Family history of diabetes mellitus: Secondary | ICD-10-CM | POA: Diagnosis not present

## 2018-07-11 DIAGNOSIS — E119 Type 2 diabetes mellitus without complications: Secondary | ICD-10-CM | POA: Diagnosis not present

## 2018-07-11 DIAGNOSIS — D631 Anemia in chronic kidney disease: Secondary | ICD-10-CM | POA: Diagnosis present

## 2018-07-11 DIAGNOSIS — K650 Generalized (acute) peritonitis: Secondary | ICD-10-CM | POA: Diagnosis not present

## 2018-07-11 DIAGNOSIS — I160 Hypertensive urgency: Secondary | ICD-10-CM | POA: Diagnosis not present

## 2018-07-11 DIAGNOSIS — R197 Diarrhea, unspecified: Secondary | ICD-10-CM | POA: Diagnosis not present

## 2018-07-11 DIAGNOSIS — F329 Major depressive disorder, single episode, unspecified: Secondary | ICD-10-CM | POA: Diagnosis present

## 2018-07-11 DIAGNOSIS — Z6838 Body mass index (BMI) 38.0-38.9, adult: Secondary | ICD-10-CM | POA: Diagnosis not present

## 2018-07-11 DIAGNOSIS — E1122 Type 2 diabetes mellitus with diabetic chronic kidney disease: Secondary | ICD-10-CM | POA: Diagnosis present

## 2018-07-11 DIAGNOSIS — Z823 Family history of stroke: Secondary | ICD-10-CM | POA: Diagnosis not present

## 2018-07-11 DIAGNOSIS — T8571XD Infection and inflammatory reaction due to peritoneal dialysis catheter, subsequent encounter: Secondary | ICD-10-CM | POA: Diagnosis not present

## 2018-07-11 DIAGNOSIS — Z8349 Family history of other endocrine, nutritional and metabolic diseases: Secondary | ICD-10-CM | POA: Diagnosis not present

## 2018-07-11 DIAGNOSIS — I10 Essential (primary) hypertension: Secondary | ICD-10-CM | POA: Diagnosis not present

## 2018-07-11 DIAGNOSIS — K659 Peritonitis, unspecified: Secondary | ICD-10-CM | POA: Diagnosis not present

## 2018-07-11 DIAGNOSIS — Z809 Family history of malignant neoplasm, unspecified: Secondary | ICD-10-CM | POA: Diagnosis not present

## 2018-07-11 DIAGNOSIS — I120 Hypertensive chronic kidney disease with stage 5 chronic kidney disease or end stage renal disease: Secondary | ICD-10-CM | POA: Diagnosis present

## 2018-07-11 DIAGNOSIS — E785 Hyperlipidemia, unspecified: Secondary | ICD-10-CM | POA: Diagnosis present

## 2018-07-11 DIAGNOSIS — N186 End stage renal disease: Secondary | ICD-10-CM | POA: Diagnosis present

## 2018-07-11 LAB — GLUCOSE, CAPILLARY
Glucose-Capillary: 178 mg/dL — ABNORMAL HIGH (ref 70–99)
Glucose-Capillary: 152 mg/dL — ABNORMAL HIGH (ref 70–99)
Glucose-Capillary: 155 mg/dL — ABNORMAL HIGH (ref 70–99)
Glucose-Capillary: 152 mg/dL — ABNORMAL HIGH (ref 70–99)

## 2018-07-11 LAB — TSH: TSH: 2.575 u[IU]/mL (ref 0.350–4.500)

## 2018-07-11 MED ORDER — ONDANSETRON HCL 4 MG PO TABS: 4.0000 mg | ORAL_TABLET | Freq: Four times a day (QID) | ORAL | Status: DC | PRN

## 2018-07-11 MED ORDER — SERTRALINE HCL 50 MG PO TABS
25.0000 mg | ORAL_TABLET | Freq: Every day | ORAL | Status: DC
  Administered 2018-07-11 – 2018-07-22 (×11): 25 mg via ORAL
  Filled 2018-07-11 (×12): qty 1

## 2018-07-11 MED ORDER — FOLIC ACID 1 MG PO TABS
1.0000 mg | ORAL_TABLET | Freq: Every day | ORAL | Status: DC
  Administered 2018-07-11 – 2018-07-22 (×12): 1 mg via ORAL
  Filled 2018-07-11 (×12): qty 1

## 2018-07-11 MED ORDER — HEPARIN 1000 UNIT/ML FOR PERITONEAL DIALYSIS
500.0000 [IU] | INTRAMUSCULAR | Status: DC | PRN
  Filled 2018-07-11: qty 0.5

## 2018-07-11 MED ORDER — ACETAMINOPHEN 325 MG PO TABS
650.0000 mg | ORAL_TABLET | Freq: Four times a day (QID) | ORAL | Status: DC | PRN
  Administered 2018-07-11 – 2018-07-20 (×10): 650 mg via ORAL
  Filled 2018-07-11 (×11): qty 2

## 2018-07-11 MED ORDER — VITAMIN B-12 1000 MCG PO TABS
1000.0000 ug | ORAL_TABLET | Freq: Every day | ORAL | Status: DC
  Administered 2018-07-11 – 2018-07-22 (×12): 1000 ug via ORAL
  Filled 2018-07-11 (×12): qty 1

## 2018-07-11 MED ORDER — TRAZODONE HCL 50 MG PO TABS
50.0000 mg | ORAL_TABLET | Freq: Every day | ORAL | Status: DC
  Administered 2018-07-13 – 2018-07-21 (×8): 50 mg via ORAL
  Filled 2018-07-11 (×11): qty 1

## 2018-07-11 MED ORDER — CALCIUM ACETATE (PHOS BINDER) 667 MG PO CAPS
2001.0000 mg | ORAL_CAPSULE | Freq: Three times a day (TID) | ORAL | Status: DC
  Administered 2018-07-11 – 2018-07-22 (×26): 2001 mg via ORAL
  Filled 2018-07-11 (×30): qty 3

## 2018-07-11 MED ORDER — IRBESARTAN 150 MG PO TABS
300.0000 mg | ORAL_TABLET | Freq: Every day | ORAL | Status: DC
  Administered 2018-07-11 – 2018-07-21 (×11): 300 mg via ORAL
  Filled 2018-07-11 (×11): qty 2

## 2018-07-11 MED ORDER — INSULIN ASPART 100 UNIT/ML ~~LOC~~ SOLN
0.0000 [IU] | Freq: Three times a day (TID) | SUBCUTANEOUS | Status: DC
  Administered 2018-07-11 – 2018-07-12 (×4): 2 [IU] via SUBCUTANEOUS
  Administered 2018-07-12: 3 [IU] via SUBCUTANEOUS
  Administered 2018-07-12 – 2018-07-15 (×8): 2 [IU] via SUBCUTANEOUS
  Administered 2018-07-15: 5 [IU] via SUBCUTANEOUS
  Administered 2018-07-15: 3 [IU] via SUBCUTANEOUS
  Administered 2018-07-16: 2 [IU] via SUBCUTANEOUS
  Administered 2018-07-16: 3 [IU] via SUBCUTANEOUS
  Administered 2018-07-16: 2 [IU] via SUBCUTANEOUS
  Administered 2018-07-17: 1 [IU] via SUBCUTANEOUS
  Administered 2018-07-17: 2 [IU] via SUBCUTANEOUS
  Administered 2018-07-17: 3 [IU] via SUBCUTANEOUS
  Administered 2018-07-18 (×3): 2 [IU] via SUBCUTANEOUS
  Administered 2018-07-19: 1 [IU] via SUBCUTANEOUS
  Administered 2018-07-20 – 2018-07-21 (×2): 2 [IU] via SUBCUTANEOUS
  Administered 2018-07-21 – 2018-07-22 (×2): 1 [IU] via SUBCUTANEOUS
  Filled 2018-07-11 (×29): qty 1

## 2018-07-11 MED ORDER — ATORVASTATIN CALCIUM 20 MG PO TABS
80.0000 mg | ORAL_TABLET | Freq: Every morning | ORAL | Status: DC
  Administered 2018-07-11 – 2018-07-21 (×11): 80 mg via ORAL
  Filled 2018-07-11 (×11): qty 4

## 2018-07-11 MED ORDER — DELFLEX-LC/2.5% DEXTROSE 394 MOSM/L IP SOLN
INTRAPERITONEAL | Status: DC
  Administered 2018-07-11 – 2018-07-14 (×4): via INTRAPERITONEAL
  Filled 2018-07-11 (×8): qty 3000

## 2018-07-11 MED ORDER — PIPERACILLIN-TAZOBACTAM 3.375 G IVPB
3.3750 g | Freq: Two times a day (BID) | INTRAVENOUS | Status: DC
  Administered 2018-07-11 – 2018-07-15 (×8): 3.375 g via INTRAVENOUS
  Filled 2018-07-11 (×8): qty 50

## 2018-07-11 MED ORDER — CARVEDILOL 6.25 MG PO TABS
6.2500 mg | ORAL_TABLET | Freq: Two times a day (BID) | ORAL | Status: DC
  Administered 2018-07-11 – 2018-07-22 (×22): 6.25 mg via ORAL
  Filled 2018-07-11 (×22): qty 1

## 2018-07-11 MED ORDER — VANCOMYCIN HCL 10 G IV SOLR
2000.0000 mg | Freq: Once | INTRAVENOUS | Status: AC
  Administered 2018-07-11: 2000 mg via INTRAVENOUS
  Filled 2018-07-11: qty 2000

## 2018-07-11 MED ORDER — INSULIN ASPART 100 UNIT/ML ~~LOC~~ SOLN
0.0000 [IU] | Freq: Every day | SUBCUTANEOUS | Status: DC
  Administered 2018-07-12 – 2018-07-17 (×3): 2 [IU] via SUBCUTANEOUS
  Filled 2018-07-11 (×3): qty 1

## 2018-07-11 MED ORDER — PIPERACILLIN-TAZOBACTAM 3.375 G IVPB 30 MIN
3.3750 g | Freq: Once | INTRAVENOUS | Status: AC
  Administered 2018-07-11: 3.375 g via INTRAVENOUS
  Filled 2018-07-11: qty 50

## 2018-07-11 MED ORDER — ACETAMINOPHEN 650 MG RE SUPP: 650.0000 mg | Freq: Four times a day (QID) | RECTAL | Status: DC | PRN

## 2018-07-11 MED ORDER — GENTAMICIN SULFATE 0.1 % EX CREA
1.0000 "application " | TOPICAL_CREAM | Freq: Every day | CUTANEOUS | Status: DC
  Administered 2018-07-11 – 2018-07-22 (×7): 1 via TOPICAL
  Filled 2018-07-11: qty 15

## 2018-07-11 MED ORDER — DOCUSATE SODIUM 100 MG PO CAPS
100.0000 mg | ORAL_CAPSULE | Freq: Two times a day (BID) | ORAL | Status: DC
  Administered 2018-07-11 – 2018-07-22 (×20): 100 mg via ORAL
  Filled 2018-07-11 (×23): qty 1

## 2018-07-11 MED ORDER — ONDANSETRON HCL 4 MG/2ML IJ SOLN
4.0000 mg | Freq: Four times a day (QID) | INTRAMUSCULAR | Status: DC | PRN
  Administered 2018-07-13: 4 mg via INTRAVENOUS
  Filled 2018-07-11: qty 2

## 2018-07-11 MED ORDER — HEPARIN SODIUM (PORCINE) 5000 UNIT/ML IJ SOLN
5000.0000 [IU] | Freq: Three times a day (TID) | INTRAMUSCULAR | Status: DC
  Administered 2018-07-11 – 2018-07-22 (×31): 5000 [IU] via SUBCUTANEOUS
  Filled 2018-07-11 (×31): qty 1

## 2018-07-11 MED ORDER — FLUTICASONE PROPIONATE 50 MCG/ACT NA SUSP
2.0000 | Freq: Every day | NASAL | Status: DC
  Administered 2018-07-15 – 2018-07-22 (×5): 2 via NASAL
  Filled 2018-07-11 (×2): qty 16

## 2018-07-11 MED ORDER — POLYETHYLENE GLYCOL 3350 17 G PO PACK
17.0000 g | PACK | Freq: Every day | ORAL | Status: DC
  Administered 2018-07-11 – 2018-07-22 (×9): 17 g via ORAL
  Filled 2018-07-11 (×11): qty 1

## 2018-07-11 MED ORDER — SODIUM CHLORIDE 0.9 % IV SOLN
INTRAVENOUS | Status: DC | PRN
  Administered 2018-07-11: 100 mL via INTRAVENOUS
  Administered 2018-07-12: 250 mL via INTRAVENOUS
  Administered 2018-07-13: 50 mL via INTRAVENOUS
  Administered 2018-07-14: 20 mL via INTRAVENOUS
  Administered 2018-07-15: 250 mL via INTRAVENOUS
  Administered 2018-07-18: 20 mL via INTRAVENOUS
  Administered 2018-07-19: 10 mL via INTRAVENOUS

## 2018-07-11 MED ORDER — TRAMADOL HCL 50 MG PO TABS
50.0000 mg | ORAL_TABLET | Freq: Four times a day (QID) | ORAL | Status: DC | PRN
  Administered 2018-07-11 – 2018-07-22 (×28): 50 mg via ORAL
  Filled 2018-07-11 (×29): qty 1

## 2018-07-11 NOTE — Plan of Care (Addendum)
Peritoneal  fluid brought from home and was taken down to dialysis center for them to obtain a sample for testing. Pain has been and issue for the patient PRN pain meds provided. Eating. IV antibiotics restarted. A one time dose of vanc started. The nephrologist has seen the patient today. Monitoring for fevers. Anuric today yet states in occassions he is still able to urinate.    Problem: Education: Goal: Knowledge of General Education information will improve Description Including pain rating scale, medication(s)/side effects and non-pharmacologic comfort measures Outcome: Progressing   Problem: Health Behavior/Discharge Planning: Goal: Ability to manage health-related needs will improve Outcome: Progressing   Problem: Clinical Measurements: Goal: Ability to maintain clinical measurements within normal limits will improve Outcome: Progressing Goal: Will remain free from infection Outcome: Progressing Goal: Diagnostic test results will improve Outcome: Progressing Goal: Respiratory complications will improve Outcome: Progressing Goal: Cardiovascular complication will be avoided Outcome: Progressing   Problem: Activity: Goal: Risk for activity intolerance will decrease Outcome: Progressing   Problem: Nutrition: Goal: Adequate nutrition will be maintained Outcome: Progressing   Problem: Coping: Goal: Level of anxiety will decrease Outcome: Progressing   Problem: Elimination: Goal: Will not experience complications related to bowel motility Outcome: Progressing Goal: Will not experience complications related to urinary retention Outcome: Progressing   Problem: Pain Managment: Goal: General experience of comfort will improve Outcome: Progressing   Problem: Safety: Goal: Ability to remain free from injury will improve Outcome: Progressing   Problem: Skin Integrity: Goal: Risk for impaired skin integrity will decrease Outcome: Progressing   Problem: Education: Goal:  Knowledge of disease and its progression will improve Outcome: Progressing Goal: Individualized Educational Video(s) Outcome: Progressing   Problem: Fluid Volume: Goal: Compliance with measures to maintain balanced fluid volume will improve Outcome: Progressing   Problem: Health Behavior/Discharge Planning: Goal: Ability to manage health-related needs will improve Outcome: Progressing   Problem: Nutritional: Goal: Ability to make healthy dietary choices will improve Outcome: Progressing   Problem: Clinical Measurements: Goal: Complications related to the disease process, condition or treatment will be avoided or minimized Outcome: Progressing

## 2018-07-11 NOTE — H&P (Signed)
Jake Gomez is an 48 y.o. male.   Chief Complaint: Abdominal pain HPI: The patient past medical history of end-stage renal disease on peritoneal dialysis, hypertension and diabetes presents to the emergency department complaining of abdominal pain.  The patient states that he has had some abdominal pains the last few nights but tonight awoke with pain that was not relieved by Tylenol.  He also admits to having nonbloody diarrhea due to taking stool softeners that he thought the patient might be causing his abdominal pain.  In the emergency department the patient was found to meet criteria for sepsis.  He was started on broad-spectrum antibiotics prior to the emergency department staff calling the hospitalist service for admission.  Past Medical History:  Diagnosis Date  . Diabetes mellitus without complication ([place])   . Hypertension   . Renal disorder     Past Surgical History:  Procedure Laterality Date  . peritonal dialysis      Family History  Problem Relation Age of Onset  . Thyroid disease Mother   . Stroke Father   . Cancer Father   . Diabetes Father    Social History:  reports that he has quit smoking. He quit smokeless tobacco use about 8 years ago. He reports that he does not drink alcohol or use drugs.  Allergies: No Known Allergies  Medications Prior to Admission  Medication Sig Dispense Refill  . acetaminophen (TYLENOL) 500 MG tablet Take 500 mg by mouth every 6 (six) hours as needed for mild pain.     Marland Kitchen atorvastatin (LIPITOR) 80 MG tablet Take 1 tablet (80 mg total) by mouth every morning. 30 tablet 5  . calcium acetate (PHOSLO) 667 MG capsule Take 2,001 mg by mouth 3 (three) times daily with meals.     . carvedilol (COREG) 6.25 MG tablet Take 1 tablet (6.25 mg total) by mouth 2 (two) times daily with a meal. 60 tablet 1  . docusate sodium (COLACE) 100 MG capsule Take 1 capsule (100 mg total) by mouth 2 (two) times daily. 10 capsule 0  . fluticasone (FLONASE) 50  MCG/ACT nasal spray Place 2 sprays into both nostrils daily. 16 g 6  . folic acid (FOLVITE) 1 MG tablet Take 1 tablet (1 mg total) by mouth daily. 30 tablet 0  . glipiZIDE (GLUCOTROL) 5 MG tablet TAKE 1 TABLET BY MOUTH ONCE DAILY BEFORE BREAKFAST 30 tablet 0  . insulin regular (NOVOLIN R RELION) 100 units/mL injection Inject subcutaneously daily as directed on the following sliding scale: 2 units for blood glucose reading equal to or greater than 150 and 1 unit for each blood glucose reading of 50. 10 mL 4  . Insulin Syringes, Disposable, U-100 1 ML MISC 1 each by Does not apply route daily. 100 each 0  . irbesartan (AVAPRO) 300 MG tablet Take 300 mg by mouth at bedtime.     . polyethylene glycol (MIRALAX / GLYCOLAX) packet Take 17 g by mouth daily. 14 each 0  . RELION INSULIN SYRINGE 1ML/31G 31G X 5/16" 1 ML MISC 1 EACH BY DOSE NOT APPLY ROUTE DAILY  0  . sertraline (ZOLOFT) 25 MG tablet Take 1 tablet (25 mg total) by mouth daily. One half tablet a day for 10 days, then 1 a day 30 tablet 0  . traMADol (ULTRAM) 50 MG tablet Take 1 tablet (50 mg total) by mouth every 6 (six) hours as needed for moderate pain. 15 tablet 0  . traZODone (DESYREL) 50 MG tablet Take 50 mg  by mouth at bedtime. Dr Candiss Norse  0  . vancomycin 1,000 mg, cefTAZidime 1,500 mg, heparin 1,500 Units in dialysis solution 1.5% low-MG/low-CA 344 MOSM/L 3,000 mL 1000mg  vanc  1500mg  ceftazadime with PD Daily for 2 weeks 14 vial 0  . vitamin B-12 1000 MCG tablet Take 1 tablet (1,000 mcg total) by mouth daily. 30 tablet 0    Results for orders placed or performed during the hospital encounter of 07/10/18 (from the past 48 hour(s))  CBC with Differential     Status: Abnormal   Collection Time: 07/10/18  9:42 PM  Result Value Ref Range   WBC 12.7 (H) 4.0 - 10.5 K/uL   RBC 3.52 (L) 4.22 - 5.81 MIL/uL   Hemoglobin 11.2 (L) 13.0 - 17.0 g/dL   HCT 34.4 (L) 39.0 - 52.0 %   MCV 97.7 80.0 - 100.0 fL   MCH 31.8 26.0 - 34.0 pg   MCHC 32.6 30.0  - 36.0 g/dL   RDW 14.8 11.5 - 15.5 %   Platelets 177 150 - 400 K/uL   nRBC 0.0 0.0 - 0.2 %   Neutrophils Relative % 88 %   Neutro Abs 11.2 (H) 1.7 - 7.7 K/uL   Lymphocytes Relative 8 %   Lymphs Abs 1.1 0.7 - 4.0 K/uL   Monocytes Relative 3 %   Monocytes Absolute 0.4 0.1 - 1.0 K/uL   Eosinophils Relative 0 %   Eosinophils Absolute 0.0 0.0 - 0.5 K/uL   Basophils Relative 0 %   Basophils Absolute 0.0 0.0 - 0.1 K/uL   Immature Granulocytes 1 %   Abs Immature Granulocytes 0.08 (H) 0.00 - 0.07 K/uL    Comment: Performed at [facility], [address] [place], [place] [address]  Comprehensive metabolic panel     Status: Abnormal   Collection Time: 07/10/18  9:42 PM  Result Value Ref Range   Sodium 132 (L) 135 - 145 mmol/L   Potassium 4.6 3.5 - 5.1 mmol/L   Chloride 96 (L) 98 - 111 mmol/L   CO2 25 22 - 32 mmol/L   Glucose, Bld 349 (H) 70 - 99 mg/dL   BUN 56 (H) 6 - 20 mg/dL   Creatinine, Ser 9.89 (H) 0.61 - 1.24 mg/dL   Calcium 9.6 8.9 - 10.3 mg/dL   Total Protein 7.9 6.5 - 8.1 g/dL   Albumin 3.5 3.5 - 5.0 g/dL   AST 35 15 - 41 U/L   ALT 56 (H) 0 - 44 U/L   Alkaline Phosphatase 206 (H) 38 - 126 U/L   Total Bilirubin 0.8 0.3 - 1.2 mg/dL   GFR calc non Af Amer 6 (L) >60 mL/min   GFR calc Af Amer 7 (L) >60 mL/min   Anion gap 11 5 - 15    Comment: Performed at [facility], [address] [place], [place] [address]  Lipase, blood     Status: None   Collection Time: 07/10/18  9:42 PM  Result Value Ref Range   Lipase 40 11 - 51 U/L    Comment: Performed at [facility], [address] [place], [place] [address]  TSH     Status: None   Collection Time: 07/10/18  9:42 PM  Result Value Ref Range   TSH 2.575 0.350 - 4.500 uIU/mL    Comment: Performed by a 3rd Generation assay with a functional sensitivity of <=0.01 uIU/mL. Performed at [facility], [address], [place], [place] [address]    No results  found.  Review of Systems   Constitutional: Negative for chills and fever.  HENT: Negative for sore throat and tinnitus.   Eyes: Negative for blurred vision and redness.  Respiratory: Negative for cough and shortness of breath.   Cardiovascular: Negative for chest pain, palpitations, orthopnea and PND.  Gastrointestinal: Positive for abdominal pain, diarrhea and nausea. Negative for vomiting.  Genitourinary: Negative for dysuria, frequency and urgency.  Musculoskeletal: Negative for joint pain and myalgias.  Skin: Negative for rash.       No lesions  Neurological: Negative for speech change, focal weakness and weakness.  Endo/Heme/Allergies: Does not bruise/bleed easily.       No temperature intolerance  Psychiatric/Behavioral: Negative for depression and suicidal ideas.    Blood pressure 117/63, pulse (!) 102, temperature 99.5 F (37.5 C), temperature source Oral, resp. rate 18, height 5\' 8"  (1.727 m), weight 114.7 kg, SpO2 98 %. Physical Exam  Vitals reviewed. Constitutional: He is oriented to person, place, and time. He appears well-developed and well-nourished. No distress.  HENT:  Head: Normocephalic and atraumatic.  Mouth/Throat: Oropharynx is clear and moist.  Eyes: Pupils are equal, round, and reactive to light. Conjunctivae and EOM are normal. No scleral icterus.  Neck: Normal range of motion. Neck supple. No JVD present. No tracheal deviation present. No thyromegaly present.  Cardiovascular: Normal rate, regular rhythm and normal heart sounds. Exam reveals no gallop and no friction rub.  No murmur heard. Respiratory: Effort normal and breath sounds normal. No respiratory distress.  GI: Soft. Bowel sounds are normal. He exhibits no distension and no mass. There is abdominal tenderness. There is no rebound and no guarding.  Genitourinary:    Genitourinary Comments: Deferred   Musculoskeletal: Normal range of motion.        General: No edema.  Lymphadenopathy:    He has no cervical adenopathy.   Neurological: He is alert and oriented to person, place, and time. No cranial nerve deficit.  Skin: Skin is warm and dry. No rash noted. No erythema.  Psychiatric: He has a normal mood and affect. His behavior is normal. Judgment and thought content normal.     Assessment/Plan This is a 48 year old male admitted for sepsis. 1.  Sepsis: The patient meets criteria via tachycardia and leukocytosis.  He is hemodynamically stable.  Potential etiologies peritoneal fluid.  Continue Zosyn.  The patient's catheter is packed with vancomycin daily. 2.  ESRD: On peritoneal dialysis.  Concern for SBP and/or peritoneal catheter infection.  Dialysis nurse to collect peritoneal fluid for culture.  Obtain cell count and follow growth and sensitivities 3.  Hypertension: Controlled; continue carvedilol and ARB 4.  Diabetes mellitus type 2: Sliding scale insulin while hospitalized 5.  Hyperlipidemia: Continue statin therapy 6.  DVT prophylaxis: Heparin 7.  GI prophylaxis: None The patient is a full code.  Time spent on admission orders and patient care approximately 45 minutes  Harrie Foreman, MD 07/11/2018, 6:57 AM

## 2018-07-11 NOTE — Progress Notes (Signed)
[place], [place] 07/11/18  Subjective:   Patient known to our practice from outpatient dialysis.  He presents for about 24 hours of abdominal pain.  Yesterday he thought it was constipation therefore started taking MiraLAX but that did not help his pain.  He reported that he did a manual drain and found that his fluid was cloudy.  Therefore, he presented to the emergency room for evaluation.  He was found to have elevated WBC count of 12.7.  Placed on IV Zosyn.  Objective:  Vital signs in last 24 hours:  Temp:  [98.4 F (36.9 C)-100 F (37.8 C)] 98.4 F (36.9 C) (01/28 0900) Pulse Rate:  [87-115] 87 (01/28 0900) Resp:  [16-18] 17 (01/28 0900) BP: (117-160)/(63-83) 146/79 (01/28 0900) SpO2:  [93 %-100 %] 96 % (01/28 0900) Weight:  [114.7 kg-116.1 kg] 114.7 kg (01/28 0443)  Weight change:  Filed Weights   07/10/18 2133 07/11/18 0443  Weight: 116.1 kg 114.7 kg    Intake/Output:   No intake or output data in the 24 hours ending 07/11/18 1151   Physical Exam: General:  No acute distress, laying in the bed  HEENT  moist oral mucous membranes  Neck  supple  Pulm/lungs  normal breathing effort on room air, clear to auscultation  CVS/Heart  regular rhythm  Abdomen:   Soft, mild diffuse tenderness, some exit site tenderness  Extremities:  No edema  Neurologic:  Alert, oriented  Skin:  No acute rashes  Access:  PD catheter in place       Basic Metabolic Panel:  Recent Labs  Lab 07/10/18 2142  NA 132*  K 4.6  CL 96*  CO2 25  GLUCOSE 349*  BUN 56*  CREATININE 9.89*  CALCIUM 9.6     CBC: Recent Labs  Lab 07/10/18 2142  WBC 12.7*  NEUTROABS 11.2*  HGB 11.2*  HCT 34.4*  MCV 97.7  PLT 177      Lab Results  Component Value Date   HEPBSAG Negative 05/05/2018      Microbiology:  No results found for this or any previous visit (from the past 240 hour(s)).  Coagulation Studies: No results for input(s): LABPROT, INR in the  last 72 hours.  Urinalysis: No results for input(s): COLORURINE, LABSPEC, PHURINE, GLUCOSEU, HGBUR, BILIRUBINUR, KETONESUR, PROTEINUR, UROBILINOGEN, NITRITE, LEUKOCYTESUR in the last 72 hours.  Invalid input(s): APPERANCEUR    Imaging: No results found.   Medications:    . atorvastatin  80 mg Oral q morning - 10a  . calcium acetate  2,001 mg Oral TID WC  . carvedilol  6.25 mg Oral BID WC  . docusate sodium  100 mg Oral BID  . fluticasone  2 spray Each Nare Daily  . folic acid  1 mg Oral Daily  . heparin  5,000 Units Subcutaneous Q8H  . insulin aspart  0-5 Units Subcutaneous QHS  . insulin aspart  0-9 Units Subcutaneous TID WC  . irbesartan  300 mg Oral QHS  . polyethylene glycol  17 g Oral Daily  . sertraline  25 mg Oral Daily  . traZODone  50 mg Oral QHS  . cyanocobalamin  1,000 mcg Oral Daily   acetaminophen **OR** acetaminophen, ondansetron **OR** ondansetron (ZOFRAN) IV, traMADol  Assessment/ Plan:  48 y.o.hispanic male with end-stage renal disease, diabetes, hypertension, secondary hyperparathyroidism, hospitalization for Pseudomonas peritonitis in November 2019 at [facility],  [name]/111 kg/4 fills x 3000 cc, last fill 2500 (icodextrine), manual exchange 2500 cc  1.  ESRD, peritoneal dialysis 2.  Abdominal pain with cloudy fluid, likely peritonitis 3.  Anemia of chronic kidney disease 4.  Diabetes with CKD 5.  Secondary hyperparathyroidism  Previous Pseudomonas peritonitis from November 2019 was treated with IV vancomycin and then eventually with ceftazidime 1 g/day.  Antibiotic administration was extended by about 2 weeks  Patient will bring his fluid back from home.  We will send fluid sample from that bag.  Currently he is empty.  He will undergo PD fluid dwell and another PD sample will be obtained. Start CCPD tonight x 24 hrs. In the meantime, I agree with empiric antibiotics, pain control.    LOS: 0 Maysoon Lozada 1/28/202011:51  AM  [place], [place]  Note: This note was prepared with Dragon dictation. Any transcription errors are unintentional

## 2018-07-11 NOTE — Progress Notes (Signed)
Nephrologist messaged and paged: Do you want to restart, IV antibiotics since the patient only had an order for one time dose of zosyn.

## 2018-07-11 NOTE — Progress Notes (Signed)
CCPD initiated, pt will have 24hr treatment as MD ordered for help with inflammation. Pt is calm cooperative. States pain is being managed well.    07/11/18 2156  Cycler Setup  Total Number of Exchanges 6 (+2, * exchanges total )  Fill Volume 2800  Dianeal Solution Dextrose 2.5% in 6000 mL  Last Fill Volume 0  Fill Time - Minute(s) 10  Dwell Time - Hour(s) 2  Dwell Time - Minute(s) 24  Drain Time - Minute(s) 20 mins  Education / Care Plan  Dialysis Education Provided Yes (Education on Bowel Hygiene/ Peritonitis education provided. )  Documented Education in Care Plan Yes  Hand-Off documentation  Report given to (Full Name) Beatris Ship, RN

## 2018-07-11 NOTE — ED Notes (Signed)
Beatris Ship called and stated that in order for her to obtain a sample from the patients peritoneal catheter that an order from the nephrologist would have to be entered and that was the only way. Beatris Ship also spoke with Vladimir Crofts, PA as well. Provider stated that it was ok for the patient to be admitted and have the sample collected.

## 2018-07-11 NOTE — Progress Notes (Signed)
Pre PD assessment    07/11/18 2100  Respiratory  Respiratory Pattern Regular;Unlabored  Chest Assessment Chest expansion symmetrical  Bilateral Breath Sounds Clear;Diminished  Cough None  Cardiac  Pulse Regular  Heart Sounds S1, S2  Vascular  Edema Generalized  Generalized Edema +2  Psychosocial  Psychosocial (WDL) WDL

## 2018-07-11 NOTE — Progress Notes (Signed)
[place] at [place] NAME: Jake Gomez    MR#:  [id number]  DATE OF BIRTH:  [birth year]  SUBJECTIVE:   Patient presented to the hospital due to abdominal pain and suspected to have peritonitis.  Still having some abdominal pain.  Patient usually undergoes peritoneal dialysis.  Recently was treated for Pseudomonas peritonitis with IV and oral antibiotics in November of last year.  He denies any fevers chills, nausea vomiting associated with his belly pain.  REVIEW OF SYSTEMS:    Review of Systems  Constitutional: Negative for chills and fever.  HENT: Negative for congestion and tinnitus.   Eyes: Negative for blurred vision and double vision.  Respiratory: Negative for cough, shortness of breath and wheezing.   Cardiovascular: Negative for chest pain, orthopnea and PND.  Gastrointestinal: Positive for abdominal pain. Negative for diarrhea, nausea and vomiting.  Genitourinary: Negative for dysuria and hematuria.  Neurological: Negative for dizziness, sensory change and focal weakness.  All other systems reviewed and are negative.   Nutrition: Renal/Carb modified Tolerating Diet: Yes Tolerating PT: Await Eval.   DRUG ALLERGIES:  No Known Allergies  VITALS:  Blood pressure (!) 146/79, pulse 87, temperature 98.4 F (36.9 C), temperature source Oral, resp. rate 17, height 5\' 8"  (1.727 m), weight 114.7 kg, SpO2 96 %.  PHYSICAL EXAMINATION:   Physical Exam  GENERAL:  48 y.o.-year-old obese patient lying in bed in no acute distress.  EYES: Pupils equal, round, reactive to light and accommodation. No scleral icterus. Extraocular muscles intact.  HEENT: Head atraumatic, normocephalic. Oropharynx and nasopharynx clear.  NECK:  Supple, no jugular venous distention. No thyroid enlargement, no tenderness.  LUNGS: Normal breath sounds bilaterally, no wheezing, rales, rhonchi. No use of accessory muscles of respiration.  CARDIOVASCULAR: S1, S2  normal. No murmurs, rubs, or gallops.  ABDOMEN: Soft, Tender in epigastric area, no rebound, rigidity,  nondistended. Bowel sounds present. No organomegaly or mass.  + PD cath in place.  EXTREMITIES: No cyanosis, clubbing or edema b/l.    NEUROLOGIC: Cranial nerves II through XII are intact. No focal Motor or sensory deficits b/l.   PSYCHIATRIC: The patient is alert and oriented x 3.  SKIN: No obvious rash, lesion, or ulcer.    LABORATORY PANEL:   CBC Recent Labs  Lab 07/10/18 2142  WBC 12.7*  HGB 11.2*  HCT 34.4*  PLT 177   ------------------------------------------------------------------------------------------------------------------  Chemistries  Recent Labs  Lab 07/10/18 2142  NA 132*  K 4.6  CL 96*  CO2 25  GLUCOSE 349*  BUN 56*  CREATININE 9.89*  CALCIUM 9.6  AST 35  ALT 56*  ALKPHOS 206*  BILITOT 0.8   ------------------------------------------------------------------------------------------------------------------  Cardiac Enzymes No results for input(s): TROPONINI in the last 168 hours. ------------------------------------------------------------------------------------------------------------------  RADIOLOGY:  No results found.   ASSESSMENT AND PLAN:   48 year old male with past medical history of end-stage renal disease on peritoneal dialysis, hypertension, diabetes, hyperlipidemia, previous history of Pseudomonas peritonitis who presents to the hospital due to abdominal pain.  1.  Abdominal pain-suspected to be secondary to peritonitis given his history.  As per the patient patient's fluid was cloudy with his PD exchange.  He was recently treated for Pseudomonas peritonitis with IV Zosyn and switched over to IV ceftazidime and also oral Levaquin. - Continue supportive care with pain control, will test his peritoneal fluid that he will bring for home, continue PDS per nephrology. - Continue empiric antibiotics with vancomycin, Zosyn for now.  2.   End-stage renal disease on peritoneal dialysis- nephrology has been consulted, continue PD exchange as per them.  3.  Essential hypertension- continue Coreg, Avapro  4.  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  Follow blood sugars.  5.  Depression-continue Zoloft.  6.  Hyperlipidemia-continue atorvastatin.  7.  Secondary hyperparathyroidism-continue PhosLo.     All the records are reviewed and case discussed with Care Management/Social Worker. Management plans discussed with the patient, family and they are in agreement.  CODE STATUS: Full code  DVT Prophylaxis: Hep. SQ  TOTAL TIME TAKING CARE OF THIS PATIENT: 30 minutes.   POSSIBLE D/C IN 1-2 DAYS, DEPENDING ON CLINICAL CONDITION.   Henreitta Leber M.D on 07/11/2018 at 3:10 PM  Between 7am to 6pm - Pager - [phone]  After 6pm go to [email or web address] - Proofreader  [facility]  Office  [phone]  CC: Primary care physician; Juline Patch, MD

## 2018-07-12 DIAGNOSIS — E1122 Type 2 diabetes mellitus with diabetic chronic kidney disease: Secondary | ICD-10-CM

## 2018-07-12 DIAGNOSIS — T8571XD Infection and inflammatory reaction due to peritoneal dialysis catheter, subsequent encounter: Secondary | ICD-10-CM

## 2018-07-12 DIAGNOSIS — I120 Hypertensive chronic kidney disease with stage 5 chronic kidney disease or end stage renal disease: Secondary | ICD-10-CM

## 2018-07-12 DIAGNOSIS — N186 End stage renal disease: Secondary | ICD-10-CM

## 2018-07-12 DIAGNOSIS — R197 Diarrhea, unspecified: Secondary | ICD-10-CM

## 2018-07-12 DIAGNOSIS — Z992 Dependence on renal dialysis: Secondary | ICD-10-CM

## 2018-07-12 DIAGNOSIS — Z87891 Personal history of nicotine dependence: Secondary | ICD-10-CM

## 2018-07-12 DIAGNOSIS — K659 Peritonitis, unspecified: Secondary | ICD-10-CM

## 2018-07-12 DIAGNOSIS — B965 Pseudomonas (aeruginosa) (mallei) (pseudomallei) as the cause of diseases classified elsewhere: Secondary | ICD-10-CM

## 2018-07-12 LAB — BODY FLUID CELL COUNT WITH DIFFERENTIAL
EOS FL: 5 %
Eos, Fluid: 2 %
Lymphs, Fluid: 5 %
Lymphs, Fluid: 7 %
MONOCYTE-MACROPHAGE-SEROUS FLUID: 4 %
Monocyte-Macrophage-Serous Fluid: 1 %
Neutrophil Count, Fluid: 92 %
Neutrophil Count, Fluid: 84 %
Other Cells, Fluid: 0 %
Total Nucleated Cell Count, Fluid: 4121 cu mm
Total Nucleated Cell Count, Fluid: 37 cu mm

## 2018-07-12 LAB — CBC
HCT: 27.7 % — ABNORMAL LOW (ref 39.0–52.0)
HEMOGLOBIN: 8.7 g/dL — AB (ref 13.0–17.0)
MCH: 31.2 pg (ref 26.0–34.0)
MCHC: 31.4 g/dL (ref 30.0–36.0)
MCV: 99.3 fL (ref 80.0–100.0)
Platelets: 151 10*3/uL (ref 150–400)
RBC: 2.79 MIL/uL — ABNORMAL LOW (ref 4.22–5.81)
RDW: 15.3 % (ref 11.5–15.5)
WBC: 12.5 10*3/uL — ABNORMAL HIGH (ref 4.0–10.5)
nRBC: 0 % (ref 0.0–0.2)

## 2018-07-12 LAB — GLUCOSE, CAPILLARY
Glucose-Capillary: 218 mg/dL — ABNORMAL HIGH (ref 70–99)
Glucose-Capillary: 199 mg/dL — ABNORMAL HIGH (ref 70–99)
Glucose-Capillary: 194 mg/dL — ABNORMAL HIGH (ref 70–99)
Glucose-Capillary: 214 mg/dL — ABNORMAL HIGH (ref 70–99)

## 2018-07-12 LAB — PATHOLOGIST SMEAR REVIEW

## 2018-07-12 MED ORDER — EPOETIN ALFA 10000 UNIT/ML IJ SOLN
20000.0000 [IU] | INTRAMUSCULAR | Status: DC
  Administered 2018-07-20: 20000 [IU] via INTRAVENOUS

## 2018-07-12 MED ORDER — MORPHINE SULFATE (PF) 2 MG/ML IV SOLN
2.0000 mg | Freq: Once | INTRAVENOUS | Status: AC
  Administered 2018-07-12: 2 mg via INTRAVENOUS
  Filled 2018-07-12 (×2): qty 1

## 2018-07-12 NOTE — Progress Notes (Signed)
[place] at [place] NAME: Jake Gomez    MR#:  [id number]  DATE OF BIRTH:  Feb 12, 1971  SUBJECTIVE:   Pt. Still complaining of  Abdominal pain but no N/V.  He is asking for regular food.  peritoneal fluid is positive for peritonitis.  REVIEW OF SYSTEMS:    Review of Systems  Constitutional: Negative for chills and fever.  HENT: Negative for congestion and tinnitus.   Eyes: Negative for blurred vision and double vision.  Respiratory: Negative for cough, shortness of breath and wheezing.   Cardiovascular: Negative for chest pain, orthopnea and PND.  Gastrointestinal: Positive for abdominal pain. Negative for diarrhea, nausea and vomiting.  Genitourinary: Negative for dysuria and hematuria.  Neurological: Negative for dizziness, sensory change and focal weakness.  All other systems reviewed and are negative.   Nutrition: Renal/Carb modified Tolerating Diet: Yes Tolerating PT: Await Eval.   DRUG ALLERGIES:  No Known Allergies  VITALS:  Blood pressure 124/70, pulse 87, temperature 98.3 F (36.8 C), temperature source Oral, resp. rate 18, height 5\' 8"  (1.727 m), weight 116.3 kg, SpO2 94 %.  PHYSICAL EXAMINATION:   Physical Exam  GENERAL:  48 y.o.-year-old obese patient lying in bed in no acute distress.  EYES: Pupils equal, round, reactive to light and accommodation. No scleral icterus. Extraocular muscles intact.  HEENT: Head atraumatic, normocephalic. Oropharynx and nasopharynx clear.  NECK:  Supple, no jugular venous distention. No thyroid enlargement, no tenderness.  LUNGS: Normal breath sounds bilaterally, no wheezing, rales, rhonchi. No use of accessory muscles of respiration.  CARDIOVASCULAR: S1, S2 normal. No murmurs, rubs, or gallops.  ABDOMEN: Soft, Tender in epigastric area, no rebound, rigidity,  nondistended. Bowel sounds present. No organomegaly or mass.  + PD cath in place.  EXTREMITIES: No cyanosis, clubbing or  edema b/l.    NEUROLOGIC: Cranial nerves II through XII are intact. No focal Motor or sensory deficits b/l.   PSYCHIATRIC: The patient is alert and oriented x 3.  SKIN: No obvious rash, lesion, or ulcer.    LABORATORY PANEL:   CBC Recent Labs  Lab 07/12/18 0354  WBC 12.5*  HGB 8.7*  HCT 27.7*  PLT 151   ------------------------------------------------------------------------------------------------------------------  Chemistries  Recent Labs  Lab 07/10/18 2142  NA 132*  K 4.6  CL 96*  CO2 25  GLUCOSE 349*  BUN 56*  CREATININE 9.89*  CALCIUM 9.6  AST 35  ALT 56*  ALKPHOS 206*  BILITOT 0.8   ------------------------------------------------------------------------------------------------------------------  Cardiac Enzymes No results for input(s): TROPONINI in the last 168 hours. ------------------------------------------------------------------------------------------------------------------  RADIOLOGY:  No results found.   ASSESSMENT AND PLAN:   48 year old male with past medical history of end-stage renal disease on peritoneal dialysis, hypertension, diabetes, hyperlipidemia, previous history of Pseudomonas peritonitis who presents to the hospital due to abdominal pain.  1.  Abdominal pain- + for Peritonitis.  Patient's fluid nucleated cells are greater than 4000 with 90% neutrophils.  He was recently treated for Pseudomonas peritonitis with IV Zosyn and switched over to IV ceftazidime and also oral Levaquin. - Continue empiric antibiotics with vancomycin, Zosyn for now. - await ID input.  Appreciate Nephrology input.    2.  End-stage renal disease on peritoneal dialysis- nephrology has been consulted, continue PD exchange as per them.  3.  Essential hypertension- continue Coreg, Avapro  4.  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   - BS stable.   5.  Depression-continue  Zoloft.  6.   Hyperlipidemia-continue atorvastatin.  7.  Secondary hyperparathyroidism-continue PhosLo.     All the records are reviewed and case discussed with Care Management/Social Worker. Management plans discussed with the patient, family and they are in agreement.  CODE STATUS: Full code  DVT Prophylaxis: Hep. SQ  TOTAL TIME TAKING CARE OF THIS PATIENT: 30 minutes.   POSSIBLE D/C IN 1-2 DAYS, DEPENDING ON CLINICAL CONDITION.   Henreitta Leber M.D on 07/12/2018 at 2:17 PM  Between 7am to 6pm - Pager - [phone]  After 6pm go to [email or web address] - Proofreader  [facility]  Office  [phone]  CC: Primary care physician; Juline Patch, MD

## 2018-07-12 NOTE — Progress Notes (Signed)
Pre/Post CCPD Assessment    07/12/18 1900  Respiratory  Respiratory Pattern Regular;Unlabored  Chest Assessment Chest expansion symmetrical  Bilateral Breath Sounds Clear;Diminished  Cardiac  Pulse Regular  Heart Sounds S1, S2  Vascular  Edema Generalized  Generalized Edema +2  Psychosocial  Psychosocial (WDL) WDL

## 2018-07-12 NOTE — Progress Notes (Signed)
[facility], [place] 07/12/18  Subjective:   Patient continues to report abdominal pain especially with fills and drains.  His PD WBC count was greater than 4000 suggesting acute peritonitis.  Peritoneal dialysis otherwise is going well.  Appetite is fair  Objective:  Vital signs in last 24 hours:  Temp:  [97.8 F (36.6 C)-99.7 F (37.6 C)] 99.7 F (37.6 C) (01/29 0535) Pulse Rate:  [86-93] 93 (01/29 0535) Resp:  [17-18] 18 (01/29 0535) BP: (122-146)/(68-78) 122/68 (01/29 0535) SpO2:  [94 %-100 %] 94 % (01/29 0535) Weight:  [116.3 kg] 116.3 kg (01/29 0500)  Weight change: 0.136 kg Filed Weights   07/10/18 2133 07/11/18 0443 07/12/18 0500  Weight: 116.1 kg 114.7 kg 116.3 kg    Intake/Output:    Intake/Output Summary (Last 24 hours) at 07/12/2018 1021 Last data filed at 07/12/2018 0535 Gross per 24 hour  Intake 946.72 ml  Output 0 ml  Net 946.72 ml     Physical Exam: General:  No acute distress, laying in the bed  HEENT  moist oral mucous membranes  Neck  supple  Pulm/lungs  normal breathing effort on room air, clear to auscultation  CVS/Heart  regular rhythm  Abdomen:   Soft, mild diffuse tenderness, some exit site tenderness  Extremities:  No edema  Neurologic:  Alert, oriented  Skin:  No acute rashes  Access:  PD catheter in place       Basic Metabolic Panel:  Recent Labs  Lab 07/10/18 2142  NA 132*  K 4.6  CL 96*  CO2 25  GLUCOSE 349*  BUN 56*  CREATININE 9.89*  CALCIUM 9.6     CBC: Recent Labs  Lab 07/10/18 2142 07/12/18 0354  WBC 12.7* 12.5*  NEUTROABS 11.2*  --   HGB 11.2* 8.7*  HCT 34.4* 27.7*  MCV 97.7 99.3  PLT 177 151      Lab Results  Component Value Date   HEPBSAG Negative 05/05/2018    Results for Jake Gomez, Jake Gomez (MRN [id number]) as of 07/12/2018 10:22  Ref. Range 07/11/2018 21:36  WBC, Fluid Latest Units: cu mm 4,121  Lymphs, Fluid Latest Units: % 5  Eos, Fluid Latest Units: % 2  Appearance,  Fluid Latest Ref Range: CLEAR  HAZY (A)  Other Cells, Fluid Latest Units: % 0  Neutrophil Count, Fluid Latest Units: % 92  Monocyte-Macrophage-Serous Fluid Latest Units: % 1    Microbiology:  No results found for this or any previous visit (from the past 240 hour(s)).  Coagulation Studies: No results for input(s): LABPROT, INR in the last 72 hours.  Urinalysis: No results for input(s): COLORURINE, LABSPEC, PHURINE, GLUCOSEU, HGBUR, BILIRUBINUR, KETONESUR, PROTEINUR, UROBILINOGEN, NITRITE, LEUKOCYTESUR in the last 72 hours.  Invalid input(s): APPERANCEUR    Imaging: No results found.   Medications:   . sodium chloride Stopped (07/11/18 1929)  . dialysis solution 2.5% low-MG/low-CA    . piperacillin-tazobactam (ZOSYN)  IV 3.375 g (07/12/18 0202)   . atorvastatin  80 mg Oral q morning - 10a  . calcium acetate  2,001 mg Oral TID WC  . carvedilol  6.25 mg Oral BID WC  . docusate sodium  100 mg Oral BID  . fluticasone  2 spray Each Nare Daily  . folic acid  1 mg Oral Daily  . gentamicin cream  1 application Topical Daily  . heparin  5,000 Units Subcutaneous Q8H  . insulin aspart  0-5 Units Subcutaneous QHS  . insulin aspart  0-9 Units Subcutaneous TID  WC  . irbesartan  300 mg Oral QHS  . polyethylene glycol  17 g Oral Daily  . sertraline  25 mg Oral Daily  . traZODone  50 mg Oral QHS  . cyanocobalamin  1,000 mcg Oral Daily   sodium chloride, acetaminophen **OR** acetaminophen, heparin, ondansetron **OR** ondansetron (ZOFRAN) IV, traMADol  Assessment/ Plan:  48 y.o.hispanic male with end-stage renal disease, diabetes, hypertension, secondary hyperparathyroidism, hospitalization for Pseudomonas peritonitis in November 2019 at [facility],  [name]/111 kg/4 fills x 3000 cc, last fill 2500 (icodextrine), manual exchange 2500 cc   1.  ESRD, peritoneal dialysis 2.  Abdominal pain with cloudy fluid, acute peritonitis 3.  Anemia of chronic kidney disease 4.   Diabetes with CKD 5.  Secondary hyperparathyroidism  Patient is currently being treated with IV vancomycin and Zosyn. Agree with ID consult Continue peritoneal dialysis Await PD fluid cultures Patient is requesting regular diet instead of renal diet.  Continue home dose of binders EPO SQ weekly (Wed)    LOS: 1 Jake Gomez 1/29/202010:21 AM  [place], [place]  Note: This note was prepared with Dragon dictation. Any transcription errors are unintentional

## 2018-07-12 NOTE — Consult Note (Signed)
NAME: Jake Gomez  DOB: 1970/09/02  MRN: [id number] Requesting provider: Verdell Carmine Date/Time: 07/12/2018 6:29 PM Subjective:  REASON FOR CONSULT: Peritonitis secondary to peritoneal dialysis. ? Jake Gomez is a 48 y.o.male with a history of end-stage renal disease getting peritoneal dialysis, diabetes mellitus, hypertension presents to the hospital with abdominal pain. Has had abdominal pain fro the past 3 months- no fever or chills Patient was admitted at [facility] in November 2019 between 18th and 23rd  with peritonitis associated with peritoneal dialysis catheter.  The peritoneal fluid had 9000 cells with 80% neutrophils and Gram stain showed 4+ gram-negative bacilli and 1+ gram-positive rods.  The culture came positive for Pseudomonas.  He was treated with intraperitoneal ceftaz edema and oral levofloxacin until December 10 of 4 weeks into treatment.  But however when he stopped antibiotics he developed recurrent abdominal pain and was admitted to [facility] on 05/25/2018.  He was seen by Dr. Ola Spurr infectious disease during that admission.  The fluid cell count was 600.  .  The peritoneal fluid culture was negative for any growth.  He was treated with intraperitoneal vancomycin and ceftazidime for 2 weeks. He is back again with abdominal pain.  He has no fever.  He has been having some diarrhea because he has been taking stool softeners for constipation.  In the ED his temperature was 99.5, blood pressure of 117/63, heart rate of 102.  WBC was 12.7.  Blood culture was sent.  Peritoneal fluid analysis showed WBC of 4121, 92% neutrophils.  It was sent for culture as well.  He has been started on vancomycin and piperacillin.  I am asked to see the patient for the peritonitis.  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 Thyroid disease Mother   . Stroke Father   . Cancer Father   . Diabetes Father    No Known Allergies  ? Current Facility-Administered Medications  Medication Dose Route Frequency Provider Last Rate Last Dose  . 0.9 %  sodium chloride infusion   Intravenous PRN Henreitta Leber, MD 10 mL/hr at 07/12/18 1516 250 mL at 07/12/18 1516  . acetaminophen (TYLENOL) tablet 650 mg  650 mg Oral Q6H PRN Harrie Foreman, MD   650 mg at 07/11/18 3154   Or  . acetaminophen (TYLENOL) suppository 650 mg  650 mg Rectal Q6H PRN Harrie Foreman, MD      . atorvastatin (LIPITOR) tablet 80 mg  80 mg Oral q morning - 10a Harrie Foreman, MD   80 mg at 07/12/18 1728  . calcium acetate (PHOSLO) capsule 2,001 mg  2,001 mg Oral TID WC Harrie Foreman, MD   2,001 mg at 07/12/18 1728  . carvedilol (COREG) tablet 6.25 mg  6.25 mg Oral BID WC Harrie Foreman, MD   6.25 mg at 07/12/18 1728  . dialysis solution 2.5% low-MG/low-CA dianeal solution   Intraperitoneal Q24H Singh, Harmeet, MD      . docusate sodium (COLACE) capsule 100 mg  100 mg Oral BID Harrie Foreman,  MD   100 mg at 07/12/18 1012  . epoetin alfa (EPOGEN,PROCRIT) injection 20,000 Units  20,000 Units Intravenous Weekly Murlean Iba, MD      . fluticasone (FLONASE) 50 MCG/ACT nasal spray 2 spray  2 spray Each Nare Daily Harrie Foreman, MD      . folic acid (FOLVITE) tablet 1 mg  1 mg Oral Daily Harrie Foreman, MD   1 mg at 07/12/18 1013  . gentamicin cream (GARAMYCIN) 0.1 % 1 application  1 application Topical Daily Murlean Iba, MD   Stopped at 07/12/18 1025  .  heparin injection 5,000 Units  5,000 Units Subcutaneous Q8H Harrie Foreman, MD   5,000 Units at 07/12/18 1509  . insulin aspart (novoLOG) injection 0-5 Units  0-5 Units Subcutaneous QHS Harrie Foreman, MD      . insulin aspart (novoLOG) injection 0-9 Units  0-9 Units Subcutaneous TID WC Harrie Foreman, MD   2 Units at 07/12/18 1728  . irbesartan (AVAPRO) tablet 300 mg  300 mg Oral QHS Harrie Foreman, MD   300 mg at 07/11/18 2126  . ondansetron (ZOFRAN) tablet 4 mg  4 mg Oral Q6H PRN Harrie Foreman, MD       Or  . ondansetron [facility]) injection 4 mg  4 mg Intravenous Q6H PRN Harrie Foreman, MD      . piperacillin-tazobactam (ZOSYN) IVPB 3.375 g  3.375 g Intravenous Q12H Murlean Iba, MD 12.5 mL/hr at 07/12/18 1517 3.375 g at 07/12/18 1517  . polyethylene glycol (MIRALAX / GLYCOLAX) packet 17 g  17 g Oral Daily Harrie Foreman, MD   17 g at 07/12/18 1020  . sertraline (ZOLOFT) tablet 25 mg  25 mg Oral Daily Harrie Foreman, MD   Stopped at 07/12/18 1025  . traMADol (ULTRAM) tablet 50 mg  50 mg Oral Q6H PRN Henreitta Leber, MD   50 mg at 07/12/18 1020  . traZODone (DESYREL) tablet 50 mg  50 mg Oral QHS Harrie Foreman, MD      . vitamin B-12 (CYANOCOBALAMIN) tablet 1,000 mcg  1,000 mcg Oral Daily Harrie Foreman, MD   1,000 mcg at 07/12/18 1013     Abtx:  Anti-infectives (From admission, onward)   Start     Dose/Rate Route Frequency Ordered Stop   07/11/18 1600  vancomycin (VANCOCIN) 2,000 mg in sodium chloride 0.9 % 500 mL IVPB     2,000 mg 250 mL/hr over 120 Minutes Intravenous  Once 07/11/18 1307 07/11/18 1845   07/11/18 1400  piperacillin-tazobactam (ZOSYN) IVPB 3.375 g     3.375 g 12.5 mL/hr over 240 Minutes Intravenous Every 12 hours 07/11/18 1307     07/11/18 0030  piperacillin-tazobactam (ZOSYN) IVPB 3.375 g     3.375 g 100 mL/hr over 30 Minutes Intravenous  Once 07/11/18 0025 07/11/18 0120      REVIEW OF SYSTEMS:  Const: negative fever,  negative chills, negative weight loss Eyes: negative diplopia or visual changes, negative eye pain ENT: negative coryza, negative sore throat Resp: negative cough, hemoptysis, dyspnea Cards: negative for chest pain, palpitations, lower extremity edema GU GI: abdominal pain, No  diarrhea, bleeding, has constipation Skin: negative for rash and pruritus Heme: negative for easy bruising and gum/nose bleeding MS: negative for myalgias, arthralgias, back pain and muscle weakness Neurolo:negative for headaches, dizziness, vertigo, memory problems  Psych: negative for feelings of anxiety, depression  Endocrine: no polydipsia Allergy/Immunology- negative for any medication or food allergies  Objective:  VITALS:  BP 124/70 (BP Location: Left Arm)   Pulse 87   Temp 98.3 F (36.8 C) (Oral)   Resp 18   Ht 5\' 8"  (1.727 m)   Wt 116.3 kg   SpO2 94%   BMI 38.97 kg/m  PHYSICAL EXAM:  General: Alert, cooperative, no distress, appears stated age.  Head: Normocephalic, without obvious abnormality, atraumatic. Eyes: Conjunctivae clear, anicteric sclerae. Pupils are equal ENT Nares normal. No drainage or sinus tenderness. Lips, mucosa, and tongue normal. No Thrush Neck: Supple, symmetrical, no adenopathy, thyroid: non tender no carotid bruit and no JVD. Back: No CVA tenderness. Lungs: Clear to auscultation bilaterally. No Wheezing or Rhonchi. No rales. Heart: Regular rate and rhythm, no murmur, rub or gallop. Abdomen: Soft, some tenderness mid abdomen- PD catheter no erythema ,. Bowel sounds normal. No masses Extremities: atraumatic, no cyanosis. No edema. No clubbing Skin: No rashes or lesions. Or bruising Lymph: Cervical, supraclavicular normal. Neurologic: Grossly non-focal Pertinent Labs Lab Results CBC    Component Value Date/Time   WBC 12.5 (H) 07/12/2018 0354   RBC 2.79 (L) 07/12/2018 0354   HGB 8.7 (L) 07/12/2018 0354   HGB 11.5 (L) 04/30/2014 1244   HCT 27.7 (L) 07/12/2018 0354    HCT 33.9 (L) 04/30/2014 1244   PLT 151 07/12/2018 0354   PLT 238 04/30/2014 1244   MCV 99.3 07/12/2018 0354   MCV 90 04/30/2014 1244   MCH 31.2 07/12/2018 0354   MCHC 31.4 07/12/2018 0354   RDW 15.3 07/12/2018 0354   RDW 13.2 04/30/2014 1244   LYMPHSABS 1.1 07/10/2018 2142   MONOABS 0.4 07/10/2018 2142   EOSABS 0.0 07/10/2018 2142   BASOSABS 0.0 07/10/2018 2142    CMP Latest Ref Rng & Units 07/10/2018 05/27/2018 05/26/2018  Glucose 70 - 99 mg/dL 349(H) 185(H) 129(H)  BUN 6 - 20 mg/dL 56(H) 46(H) 49(H)  Creatinine 0.61 - 1.24 mg/dL 9.89(H) 9.95(H) 9.96(H)  Sodium 135 - 145 mmol/L 132(L) 132(L) 132(L)  Potassium 3.5 - 5.1 mmol/L 4.6 2.9(L) 3.7  Chloride 98 - 111 mmol/L 96(L) 94(L) 94(L)  CO2 22 - 32 mmol/L 25 29 28   Calcium 8.9 - 10.3 mg/dL 9.6 9.4 9.7  Total Protein 6.5 - 8.1 g/dL 7.9 - -  Total Bilirubin 0.3 - 1.2 mg/dL 0.8 - -  Alkaline Phos 38 - 126 U/L 206(H) - -  AST 15 - 41 U/L 35 - -  ALT 0 - 44 U/L 56(H) - -      Microbiology: Recent Results (from the past 240 hour(s))  Body fluid culture     Status: None (Preliminary result)   Collection Time: 07/11/18  9:35 PM  Result Value Ref Range Status   Specimen Description FLUID PERITONEAL  Final   Special Requests NONE  Final   Gram Stain   Final    WBC PRESENT, PREDOMINANTLY PMN GRAM NEGATIVE RODS CYTOSPIN SMEAR Performed at [facility], [address] [place], [place] [address]    Culture PENDING  Incomplete   Report Status PENDING  Incomplete   IMAGING RESULTS: ?none Impression/Recommendation ? End-stage renal disease on peritoneal dialysis. ?Recurrent peritonitis secondary to peritoneal dialysis catheter .  Was treated for Pseudomonas in November 2019 with 4 weeks of antibiotic.  Was readmitted in December 2019 and had 2 more 2 weeks of antibiotics.  He is back again for the third time with abdominal pain.  The white count from the peritoneal fluid is high . Gram stain is gram neg  rods. The catheter  will have to be removed. Currently on IV vanco and zosyn- if no bacteremia the nephrologist can administer antibiotic thru IP catheter   If that is removed then administer antibiotics systemically  Diabetes mellitus management as per primary team  Hypertension management as per primary team. ? ___________________________________________________ Discussed with patient, and daughter Note:  This document was prepared using Dragon voice recognition software and may include unintentional dictation errors.

## 2018-07-12 NOTE — Progress Notes (Signed)
CCPD Continuation, pt is calm and cooperative, effluent sample collected for cell count.  Add'l 10 hrs TX started. Exit site care performed, pt has some tenderness at exit site, no redness or swelling noted. Pt instructed to be careful not to pull, catheter lumen is anchored.    07/12/18 1900  Cycler Setup  Total Number of Exchanges 5  Fill Volume 2800  Dianeal Solution Dextrose 2.5% in 6000 mL  Last Fill Volume 0  Fill Time - Minute(s) 10  Dwell Time - Hour(s) 1  Dwell Time - Minute(s) 54  Drain Time - Minute(s) 20 mins  Completion  Exit Site Care Performed Yes  Cell Count on Daytime Exchange Sent  Treatment Status Started  Fluid Balance - CCPD  Total Intake for Exchanges (mL) 11500 ml  Education / Care Plan  Dialysis Education Provided Yes  Documented Education in Care Plan Yes  Hand-Off documentation  Report given to (Full Name) Beatris Ship, RN   Report received from (Full Name) Colvin Caroli, RN

## 2018-07-13 LAB — PATHOLOGIST SMEAR REVIEW

## 2018-07-13 LAB — CBC
HCT: 26.2 % — ABNORMAL LOW (ref 39.0–52.0)
Hemoglobin: 8.4 g/dL — ABNORMAL LOW (ref 13.0–17.0)
MCH: 32.1 pg (ref 26.0–34.0)
MCHC: 32.1 g/dL (ref 30.0–36.0)
MCV: 100 fL (ref 80.0–100.0)
Platelets: 128 10*3/uL — ABNORMAL LOW (ref 150–400)
RBC: 2.62 MIL/uL — ABNORMAL LOW (ref 4.22–5.81)
RDW: 15.4 % (ref 11.5–15.5)
WBC: 8.6 10*3/uL (ref 4.0–10.5)
nRBC: 0 % (ref 0.0–0.2)

## 2018-07-13 LAB — GLUCOSE, CAPILLARY
Glucose-Capillary: 192 mg/dL — ABNORMAL HIGH (ref 70–99)
Glucose-Capillary: 161 mg/dL — ABNORMAL HIGH (ref 70–99)
Glucose-Capillary: 193 mg/dL — ABNORMAL HIGH (ref 70–99)
Glucose-Capillary: 233 mg/dL — ABNORMAL HIGH (ref 70–99)

## 2018-07-13 MED ORDER — MORPHINE SULFATE (PF) 2 MG/ML IV SOLN
1.0000 mg | Freq: Once | INTRAVENOUS | Status: AC
  Administered 2018-07-13: 1 mg via INTRAVENOUS
  Filled 2018-07-13: qty 1

## 2018-07-13 MED ORDER — MORPHINE SULFATE (PF) 2 MG/ML IV SOLN
2.0000 mg | Freq: Four times a day (QID) | INTRAVENOUS | Status: DC | PRN
  Administered 2018-07-13 – 2018-07-15 (×5): 2 mg via INTRAVENOUS
  Filled 2018-07-13 (×7): qty 1

## 2018-07-13 NOTE — Progress Notes (Signed)
[facility], [place] 07/13/18  Subjective:   Patient continues to report abdominal pain especially with fills and drains.  His PD WBC count was greater than 4000 suggesting acute peritonitis.  Peritoneal dialysis otherwise is going well.  Appetite is fair Ate a steak sandwich yesterday Yesterday evening WBC count improved to 37 Fluid is still cloudy Still has abdominal pain  Objective:  Vital signs in last 24 hours:  Temp:  [97.8 F (36.6 C)-98.9 F (37.2 C)] 98.9 F (37.2 C) (01/30 0826) Pulse Rate:  [76-87] 76 (01/30 0826) Resp:  [18] 18 (01/30 0826) BP: (103-126)/(58-76) 126/66 (01/30 0826) SpO2:  [92 %-95 %] 92 % (01/30 0826)  Weight change:  Filed Weights   07/10/18 2133 07/11/18 0443 07/12/18 0500  Weight: 116.1 kg 114.7 kg 116.3 kg    Intake/Output:    Intake/Output Summary (Last 24 hours) at 07/13/2018 1107 Last data filed at 07/12/2018 1900 Gross per 24 hour  Intake 11500 ml  Output -  Net 11500 ml     Physical Exam: General:  No acute distress, laying in the bed  HEENT  moist oral mucous membranes  Neck  supple  Pulm/lungs  normal breathing effort on room air, clear to auscultation  CVS/Heart  regular rhythm  Abdomen:   Soft, mild diffuse tenderness, some exit site tenderness  Extremities:  No edema  Neurologic:  Alert, oriented  Skin:  No acute rashes  Access:  PD catheter in place       Basic Metabolic Panel:  Recent Labs  Lab 07/10/18 2142  NA 132*  K 4.6  CL 96*  CO2 25  GLUCOSE 349*  BUN 56*  CREATININE 9.89*  CALCIUM 9.6     CBC: Recent Labs  Lab 07/10/18 2142 07/12/18 0354 07/13/18 0457  WBC 12.7* 12.5* 8.6  NEUTROABS 11.2*  --   --   HGB 11.2* 8.7* 8.4*  HCT 34.4* 27.7* 26.2*  MCV 97.7 99.3 100.0  PLT 177 151 128*      Lab Results  Component Value Date   HEPBSAG Negative 05/05/2018    Results for ALFARD, COCHRANE (MRN [id number]) as of 07/12/2018 10:22  Ref. Range 07/11/2018 21:36  WBC,  Fluid Latest Units: cu mm 4,121  Lymphs, Fluid Latest Units: % 5  Eos, Fluid Latest Units: % 2  Appearance, Fluid Latest Ref Range: CLEAR  HAZY (A)  Other Cells, Fluid Latest Units: % 0  Neutrophil Count, Fluid Latest Units: % 92  Monocyte-Macrophage-Serous Fluid Latest Units: % 1    Microbiology:  Recent Results (from the past 240 hour(s))  Body fluid culture     Status: None (Preliminary result)   Collection Time: 07/11/18  9:35 PM  Result Value Ref Range Status   Specimen Description FLUID PERITONEAL  Final   Special Requests NONE  Final   Gram Stain   Final    WBC PRESENT, PREDOMINANTLY PMN GRAM NEGATIVE RODS CYTOSPIN SMEAR    Culture   Final    FEW GRAM NEGATIVE RODS IDENTIFICATION AND SUSCEPTIBILITIES TO FOLLOW Performed at [facility], [address] [place], [place] [address]    Report Status PENDING  Incomplete    Coagulation Studies: No results for input(s): LABPROT, INR in the last 72 hours.  Urinalysis: No results for input(s): COLORURINE, LABSPEC, PHURINE, GLUCOSEU, HGBUR, BILIRUBINUR, KETONESUR, PROTEINUR, UROBILINOGEN, NITRITE, LEUKOCYTESUR in the last 72 hours.  Invalid input(s): APPERANCEUR    Imaging: No results found.   Medications:   . sodium  chloride 250 mL (07/12/18 1516)  . dialysis solution 2.5% low-MG/low-CA    . piperacillin-tazobactam (ZOSYN)  IV 3.375 g (07/13/18 0421)   . atorvastatin  80 mg Oral q morning - 10a  . calcium acetate  2,001 mg Oral TID WC  . carvedilol  6.25 mg Oral BID WC  . docusate sodium  100 mg Oral BID  . epoetin (EPOGEN/PROCRIT) injection  20,000 Units Intravenous Weekly  . fluticasone  2 spray Each Nare Daily  . folic acid  1 mg Oral Daily  . gentamicin cream  1 application Topical Daily  . heparin  5,000 Units Subcutaneous Q8H  . insulin aspart  0-5 Units Subcutaneous QHS  . insulin aspart  0-9 Units Subcutaneous TID WC  . irbesartan  300 mg Oral QHS  . polyethylene glycol  17 g Oral Daily  .  sertraline  25 mg Oral Daily  . traZODone  50 mg Oral QHS  . cyanocobalamin  1,000 mcg Oral Daily   sodium chloride, acetaminophen **OR** acetaminophen, ondansetron **OR** ondansetron (ZOFRAN) IV, traMADol  Assessment/ Plan:  48 y.o.hispanic male with end-stage renal disease, diabetes, hypertension, secondary hyperparathyroidism, hospitalization for Pseudomonas peritonitis in November 2019 at [facility],  [name]/111 kg/4 fills x 3000 cc, last fill 2500 (icodextrine), manual exchange 2500 cc   1.  ESRD, peritoneal dialysis 2.  Abdominal pain with cloudy fluid, acute peritonitis 3.  Anemia of chronic kidney disease 4.  Diabetes with CKD 5.  Secondary hyperparathyroidism  Patient is currently being treated with IV vancomycin and Zosyn. Agree with ID consult.  Microbiology-PD fluid growing gram negative rods.  ID pending Continue peritoneal dialysis  Continue home dose of binders EPO SQ weekly (Wed)    LOS: 2 Hassani Sliney Candiss Norse 1/30/202011:07 AM  [place], [place]  Note: This note was prepared with Dragon dictation. Any transcription errors are unintentional

## 2018-07-13 NOTE — Progress Notes (Signed)
[facility] NAME: Jake Gomez    MR#:  [id number]  DATE OF BIRTH:  [birth year]  SUBJECTIVE: More abdominal pain today.  No fever.  Requiring IV morphine for pain control.  Peritoneal fluid is growing gram-negative rods, final sensitivities are pending.   REVIEW OF SYSTEMS:    Review of Systems  Constitutional: Negative for chills and fever.  HENT: Negative for congestion and tinnitus.   Eyes: Negative for blurred vision and double vision.  Respiratory: Negative for cough, shortness of breath and wheezing.   Cardiovascular: Negative for chest pain, orthopnea and PND.  Gastrointestinal: Positive for abdominal pain. Negative for diarrhea, nausea and vomiting.  Genitourinary: Negative for dysuria and hematuria.  Neurological: Negative for dizziness, sensory change and focal weakness.  All other systems reviewed and are negative.   Nutrition: Renal/Carb modified Tolerating Diet: Yes Tolerating PT: Await Eval.   DRUG ALLERGIES:  No Known Allergies  VITALS:  Blood pressure 132/67, pulse 78, temperature 99.2 F (37.3 C), temperature source Oral, resp. rate 16, height 5\' 8"  (1.727 m), weight 116.3 kg, SpO2 90 %.  PHYSICAL EXAMINATION:   Physical Exam  GENERAL:  48 y.o.-year-old obese patient lying in bed in no acute distress.  EYES: Pupils equal, round, reactive to light and accommodation. No scleral icterus. Extraocular muscles intact.  HEENT: Head atraumatic, normocephalic. Oropharynx and nasopharynx clear.  NECK:  Supple, no jugular venous distention. No thyroid enlargement, no tenderness.  LUNGS: Normal breath sounds bilaterally, no wheezing, rales, rhonchi. No use of accessory muscles of respiration.  CARDIOVASCULAR: S1, S2 normal. No murmurs, rubs, or gallops.  ABDOMEN: Soft, Tender in epigastric area, no rebound, rigidity,  nondistended. Bowel sounds present. No organomegaly or mass.  + PD cath in place.  EXTREMITIES: No  cyanosis, clubbing or edema b/l.    NEUROLOGIC: Cranial nerves II through XII are intact. No focal Motor or sensory deficits b/l.   PSYCHIATRIC: The patient is alert and oriented x 3.  SKIN: No obvious rash, lesion, or ulcer.    LABORATORY PANEL:   CBC Recent Labs  Lab 07/13/18 0457  WBC 8.6  HGB 8.4*  HCT 26.2*  PLT 128*   ------------------------------------------------------------------------------------------------------------------  Chemistries  Recent Labs  Lab 07/10/18 2142  NA 132*  K 4.6  CL 96*  CO2 25  GLUCOSE 349*  BUN 56*  CREATININE 9.89*  CALCIUM 9.6  AST 35  ALT 56*  ALKPHOS 206*  BILITOT 0.8   ------------------------------------------------------------------------------------------------------------------  Cardiac Enzymes No results for input(s): TROPONINI in the last 168 hours. ------------------------------------------------------------------------------------------------------------------  RADIOLOGY:  No results found.   ASSESSMENT AND PLAN:   48 year old male with past medical history of end-stage renal disease on peritoneal dialysis, hypertension, diabetes, hyperlipidemia, previous history of Pseudomonas peritonitis who presents to the hospital due to abdominal pain.  1.  Abdominal pain- due to peritonitis, patient recently treated with antibiotics for Pseudomonas 2 times, peritoneal fluid is growing gram-negative rods, final sensitivities are pending, seen by ID Dr. Levester Fresh, continue IV vancomycin, Zosyn.  Use IV morphine for today for pain control as he states abdominal pain is worse today.  WBC normalized.  2.  End-stage renal disease on peritoneal dialysis- patient nephrology following, peritoneal fluid showing gram-negative rods, ID pending.    3.  Essential hypertension- continue Coreg, Avapro  4.  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   - BS stable.   5.   Depression-continue  Zoloft.  6.  Hyperlipidemia-continue atorvastatin.  7.  Secondary hyperparathyroidism-continue PhosLo.      All the records are reviewed and case discussed with Care Management/Social Worker. Management plans discussed with the patient, family and they are in agreement.  CODE STATUS: Full code  DVT Prophylaxis: Hep. SQ  TOTAL TIME TAKING CARE OF THIS PATIENT: 30 minutes.  Unable to for discharge yet due to pending peritoneal fluid culture results. Epifanio Lesches M.D on 07/13/2018 at 1:55 PM  Between 7am to 6pm - Pager - [phone]  After 6pm go to [email or web address] - Proofreader  [facility]  Office  [phone]  CC: Primary care physician; Juline Patch, MD

## 2018-07-13 NOTE — Progress Notes (Signed)
MD provided a one time dose of 1mg  of morphine to be given for pain 8/10.

## 2018-07-14 DIAGNOSIS — Z992 Dependence on renal dialysis: Secondary | ICD-10-CM | POA: Diagnosis not present

## 2018-07-14 DIAGNOSIS — N186 End stage renal disease: Secondary | ICD-10-CM | POA: Diagnosis not present

## 2018-07-14 DIAGNOSIS — K652 Spontaneous bacterial peritonitis: Secondary | ICD-10-CM

## 2018-07-14 LAB — BODY FLUID CELL COUNT WITH DIFFERENTIAL
Eos, Fluid: 7 %
Lymphs, Fluid: 4 %
Monocyte-Macrophage-Serous Fluid: 8 %
Neutrophil Count, Fluid: 81 %
Total Nucleated Cell Count, Fluid: 46 cu mm

## 2018-07-14 LAB — VANCOMYCIN, RANDOM: Vancomycin Rm: 18

## 2018-07-14 LAB — GLUCOSE, CAPILLARY
Glucose-Capillary: 190 mg/dL — ABNORMAL HIGH (ref 70–99)
Glucose-Capillary: 184 mg/dL — ABNORMAL HIGH (ref 70–99)
Glucose-Capillary: 186 mg/dL — ABNORMAL HIGH (ref 70–99)
Glucose-Capillary: 151 mg/dL — ABNORMAL HIGH (ref 70–99)

## 2018-07-14 LAB — PATHOLOGIST SMEAR REVIEW

## 2018-07-14 NOTE — Progress Notes (Signed)
[facility], [place] 07/14/18  Subjective:   Patient continues to have abdominal pain.  Appetite remains low PD fluid cultures growing Pseudomonas  Objective:  Vital signs in last 24 hours:  Temp:  [98.2 F (36.8 C)-99 F (37.2 C)] 98.2 F (36.8 C) (01/31 1210) Pulse Rate:  [80-90] 89 (01/31 1210) Resp:  [17-20] 20 (01/31 1210) BP: (120-144)/(57-92) 140/67 (01/31 1210) SpO2:  [90 %-93 %] 90 % (01/31 1210)  Weight change:  Filed Weights   07/10/18 2133 07/11/18 0443 07/12/18 0500  Weight: 116.1 kg 114.7 kg 116.3 kg    Intake/Output:    Intake/Output Summary (Last 24 hours) at 07/14/2018 1420 Last data filed at 07/13/2018 1957 Gross per 24 hour  Intake 251.46 ml  Output -  Net 251.46 ml     Physical Exam: General:  No acute distress, laying in the bed  HEENT  moist oral mucous membranes  Neck  supple  Pulm/lungs  normal breathing effort on room air, clear to auscultation  CVS/Heart  regular rhythm  Abdomen:   Soft, mild diffuse tenderness, some exit site tenderness  Extremities:  No edema  Neurologic:  Alert, oriented  Skin:  No acute rashes  Access:  PD catheter in place       Basic Metabolic Panel:  Recent Labs  Lab 07/10/18 2142  NA 132*  K 4.6  CL 96*  CO2 25  GLUCOSE 349*  BUN 56*  CREATININE 9.89*  CALCIUM 9.6     CBC: Recent Labs  Lab 07/10/18 2142 07/12/18 0354 07/13/18 0457  WBC 12.7* 12.5* 8.6  NEUTROABS 11.2*  --   --   HGB 11.2* 8.7* 8.4*  HCT 34.4* 27.7* 26.2*  MCV 97.7 99.3 100.0  PLT 177 151 128*      Lab Results  Component Value Date   HEPBSAG Negative 05/05/2018    Results for Jake Gomez, Jake Gomez (MRN [id number]) as of 07/12/2018 10:22  Ref. Range 07/11/2018 21:36  WBC, Fluid Latest Units: cu mm 4,121  Lymphs, Fluid Latest Units: % 5  Eos, Fluid Latest Units: % 2  Appearance, Fluid Latest Ref Range: CLEAR  HAZY (A)  Other Cells, Fluid Latest Units: % 0  Neutrophil Count, Fluid Latest Units:  % 92  Monocyte-Macrophage-Serous Fluid Latest Units: % 1    Microbiology:  Recent Results (from the past 240 hour(s))  Body fluid culture     Status: None (Preliminary result)   Collection Time: 07/11/18  9:35 PM  Result Value Ref Range Status   Specimen Description FLUID PERITONEAL  Final   Special Requests NONE  Final   Gram Stain   Final    WBC PRESENT, PREDOMINANTLY PMN GRAM NEGATIVE RODS CYTOSPIN SMEAR    Culture   Final    FEW PSEUDOMONAS AERUGINOSA SUSCEPTIBILITIES TO FOLLOW Performed at [facility], [address] [place], [place] [address]    Report Status PENDING  Incomplete    Coagulation Studies: No results for input(s): LABPROT, INR in the last 72 hours.  Urinalysis: No results for input(s): COLORURINE, LABSPEC, PHURINE, GLUCOSEU, HGBUR, BILIRUBINUR, KETONESUR, PROTEINUR, UROBILINOGEN, NITRITE, LEUKOCYTESUR in the last 72 hours.  Invalid input(s): APPERANCEUR    Imaging: No results found.   Medications:   . sodium chloride 10 mL/hr at 07/13/18 1957  . dialysis solution 2.5% low-MG/low-CA    . piperacillin-tazobactam (ZOSYN)  IV 3.375 g (07/14/18 0129)   . atorvastatin  80 mg Oral q morning - 10a  . calcium acetate  2,001  mg Oral TID WC  . carvedilol  6.25 mg Oral BID WC  . docusate sodium  100 mg Oral BID  . epoetin (EPOGEN/PROCRIT) injection  20,000 Units Intravenous Weekly  . fluticasone  2 spray Each Nare Daily  . folic acid  1 mg Oral Daily  . gentamicin cream  1 application Topical Daily  . heparin  5,000 Units Subcutaneous Q8H  . insulin aspart  0-5 Units Subcutaneous QHS  . insulin aspart  0-9 Units Subcutaneous TID WC  . irbesartan  300 mg Oral QHS  . polyethylene glycol  17 g Oral Daily  . sertraline  25 mg Oral Daily  . traZODone  50 mg Oral QHS  . cyanocobalamin  1,000 mcg Oral Daily   sodium chloride, acetaminophen **OR** acetaminophen, morphine injection, ondansetron **OR** ondansetron (ZOFRAN) IV, traMADol  Assessment/  Plan:  48 y.o.hispanic male with end-stage renal disease, diabetes, hypertension, secondary hyperparathyroidism, hospitalization for Pseudomonas peritonitis in November 2019 at [facility],  [name]/111 kg/4 fills x 3000 cc, last fill 2500 (icodextrine), manual exchange 2500 cc   1.  ESRD, peritoneal dialysis 2.  Abdominal pain with cloudy fluid, acute pseudomonal peritonitis 3.  Anemia of chronic kidney disease 4.  Diabetes with CKD 5.  Secondary hyperparathyroidism  Patient is currently being treated with IVZosyn. Microbiology-PD fluid growing Pseudomonas. I have consulted vascular surgery for removal of PD catheter and placement of a new PermCath In the meantime, we will continue PD until new access is available Plan for outpatient hemodialysis Continue home dose of binders EPO SQ weekly (Wed)    LOS: [place] 1/31/20202:20 PM  [place], [place]  Note: This note was prepared with Dragon dictation. Any transcription errors are unintentional

## 2018-07-14 NOTE — Progress Notes (Signed)
[place] [facility] [place] NAME: Jake Gomez    MR#:  [id number]  DATE OF BIRTH:  11-12-1970  SUBJECTIVE: Patient appears comfortable.  Peritoneal fluid cultures still showing Pseudomonas.   REVIEW OF SYSTEMS:    Review of Systems  Constitutional: Negative for chills and fever.  HENT: Negative for congestion and tinnitus.   Eyes: Negative for blurred vision and double vision.  Respiratory: Negative for cough, shortness of breath and wheezing.   Cardiovascular: Negative for chest pain, orthopnea and PND.  Gastrointestinal: Positive for abdominal pain. Negative for diarrhea, nausea and vomiting.  Genitourinary: Negative for dysuria and hematuria.  Neurological: Negative for dizziness, sensory change and focal weakness.  All other systems reviewed and are negative.   Nutrition: Renal/Carb modified Tolerating Diet: Yes Tolerating PT: Await Eval.   DRUG ALLERGIES:  No Known Allergies  VITALS:  Blood pressure 140/67, pulse 89, temperature 98.2 F (36.8 C), temperature source Oral, resp. rate 20, height 5\' 8"  (1.727 m), weight 116.3 kg, SpO2 90 %.  PHYSICAL EXAMINATION:   Physical Exam  GENERAL:  48 y.o.-year-old obese patient lying in bed in no acute distress.  EYES: Pupils equal, round, reactive to light and accommodation. No scleral icterus. Extraocular muscles intact.  HEENT: Head atraumatic, normocephalic. Oropharynx and nasopharynx clear.  NECK:  Supple, no jugular venous distention. No thyroid enlargement, no tenderness.  LUNGS: Normal breath sounds bilaterally, no wheezing, rales, rhonchi. No use of accessory muscles of respiration.  CARDIOVASCULAR: S1, S2 normal. No murmurs, rubs, or gallops.  ABDOMEN: Soft, Tender in epigastric area, no rebound, rigidity,  nondistended. Bowel sounds present. No organomegaly or mass.  + PD cath in place.  EXTREMITIES: No cyanosis, clubbing or edema b/l.    NEUROLOGIC: Cranial nerves II through  XII are intact. No focal Motor or sensory deficits b/l.   PSYCHIATRIC: The patient is alert and oriented x 3.  SKIN: No obvious rash, lesion, or ulcer.    LABORATORY PANEL:   CBC Recent Labs  Lab 07/13/18 0457  WBC 8.6  HGB 8.4*  HCT 26.2*  PLT 128*   ------------------------------------------------------------------------------------------------------------------  Chemistries  Recent Labs  Lab 07/10/18 2142  NA 132*  K 4.6  CL 96*  CO2 25  GLUCOSE 349*  BUN 56*  CREATININE 9.89*  CALCIUM 9.6  AST 35  ALT 56*  ALKPHOS 206*  BILITOT 0.8   ------------------------------------------------------------------------------------------------------------------  Cardiac Enzymes No results for input(s): TROPONINI in the last 168 hours. ------------------------------------------------------------------------------------------------------------------  RADIOLOGY:  No results found.   ASSESSMENT AND PLAN:   48 year old male with past medical history of end-stage renal disease on peritoneal dialysis, hypertension, diabetes, hyperlipidemia, previous history of Pseudomonas peritonitis who presents to the hospital due to abdominal pain.  1.  Abdominal pain- due to recurrent peritonitis, peritoneal fluid this time also showing Pseudomonas.,  Patient had history of Pseudomonas infection from peritoneal fluid 2 times before.  Continue Zosyn, appreciate ID following.  2.  End-stage renal disease on peritoneal dialysis- patient nephrology following, peritoneal fluid showing gram-negative rods,.  With Pseudomonas.  May need to convert him to hemodialysis, will talk to nephrology and leave it up to them.  3.  Essential hypertension- continue Coreg, Avapro  4.  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   - BS stable.   5.  Depression-continue Zoloft.  6.  Hyperlipidemia-continue atorvastatin.  7.  Secondary  hyperparathyroidism-continue PhosLo.  #7 abdominal pain secondary to peritonitis, patient  continues to request tramadol, morphine.  Continue for today.   All the records are reviewed and case discussed with Care Management/Social Worker. Management plans discussed with the patient, family and they are in agreement.  CODE STATUS: Full code  DVT Prophylaxis: Hep. SQ  TOTAL TIME TAKING CARE OF THIS PATIENT: 30 minutes.  Unable to for discharge yet due to pending peritoneal fluid culture results. Epifanio Lesches M.D on 07/14/2018 at 1:16 PM  Between 7am to 6pm - Pager - [phone]  After 6pm go to [email or web address] - Proofreader  [facility]  Office  [phone]  CC: Primary care physician; Juline Patch, MD

## 2018-07-14 NOTE — Consult Note (Signed)
[facility] Vascular Consult Note  MRN : [id number]  Jake Gomez is a 48 y.o. ([birth year]) male who presents with chief complaint of  Chief Complaint  Patient presents with  . Abdominal Pain   History of Present Illness:  The patient is a 48 year old male with a past medical history of diabetes, hypertension, end-stage renal disease on peritoneal dialysis with a recent hospitalization for peritonitis presents to the emergency department on July 10, 2018 complaining of progressively worsening abdominal pain.  The patient endorsed a history of progressively worsening abdominal pain and distention x two days which prompted him to seek medical attention in our emergency department.  The patient was recently diagnosed in December 2019 with bacterial peritonitis. The patient denied fever, nausea or vomiting.  Patient denied any issue in the functioning of his peritoneal dialysis catheter.  The patient had been experiencing multiple episodes of non-bloody diarrhea which he states is from taking stool softeners.  During his evaluation in the emergency department, the patient met the criteria for sepsis and therefore was admitted and started on broad-spectrum antibiotics.  Since his admission, the patient's abdominal pain and distention has improved.  His diarrhea has improved however peritoneal fluid cultures are positive for Pseudomonas.  Vascular surgery was consulted by Dr. Candiss Norse for peritoneal dialysis catheter removal and perm cath placement.  Current Facility-Administered Medications  Medication Dose Route Frequency Provider Last Rate Last Dose  . 0.9 %  sodium chloride infusion   Intravenous PRN Henreitta Leber, MD 10 mL/hr at 07/13/18 1957    . acetaminophen (TYLENOL) tablet 650 mg  650 mg Oral Q6H PRN Harrie Foreman, MD   650 mg at 07/14/18 1206   Or  . acetaminophen (TYLENOL) suppository 650 mg  650 mg Rectal Q6H PRN Harrie Foreman, MD      .  atorvastatin (LIPITOR) tablet 80 mg  80 mg Oral q morning - 10a Harrie Foreman, MD   80 mg at 07/13/18 1716  . calcium acetate (PHOSLO) capsule 2,001 mg  2,001 mg Oral TID WC Harrie Foreman, MD   2,001 mg at 07/14/18 1206  . carvedilol (COREG) tablet 6.25 mg  6.25 mg Oral BID WC Harrie Foreman, MD   6.25 mg at 07/14/18 0857  . dialysis solution 2.5% low-MG/low-CA dianeal solution   Intraperitoneal Q24H Singh, Harmeet, MD      . docusate sodium (COLACE) capsule 100 mg  100 mg Oral BID Harrie Foreman, MD   100 mg at 07/14/18 0857  . epoetin alfa (EPOGEN,PROCRIT) injection 20,000 Units  20,000 Units Intravenous Weekly Candiss Norse, Harmeet, MD      . fluticasone (FLONASE) 50 MCG/ACT nasal spray 2 spray  2 spray Each Nare Daily Harrie Foreman, MD      . folic acid (FOLVITE) tablet 1 mg  1 mg Oral Daily Harrie Foreman, MD   1 mg at 07/14/18 0857  . gentamicin cream (GARAMYCIN) 0.1 % 1 application  1 application Topical Daily Murlean Iba, MD   1 application at 19/50/93 1954  . heparin injection 5,000 Units  5,000 Units Subcutaneous Q8H Harrie Foreman, MD   5,000 Units at 07/14/18 [phone]  . insulin aspart (novoLOG) injection 0-5 Units  0-5 Units Subcutaneous QHS Harrie Foreman, MD   2 Units at 07/13/18 2259  . insulin aspart (novoLOG) injection 0-9 Units  0-9 Units Subcutaneous TID WC Harrie Foreman, MD   2 Units at 07/14/18 1206  . irbesartan (  AVAPRO) tablet 300 mg  300 mg Oral QHS Harrie Foreman, MD   300 mg at 07/13/18 2242  . morphine 2 MG/ML injection 2 mg  2 mg Intravenous Q6H PRN Epifanio Lesches, MD   2 mg at 07/14/18 0903  . ondansetron (ZOFRAN) tablet 4 mg  4 mg Oral Q6H PRN Harrie Foreman, MD       Or  . ondansetron [facility]) injection 4 mg  4 mg Intravenous Q6H PRN Harrie Foreman, MD   4 mg at 07/13/18 1459  . piperacillin-tazobactam (ZOSYN) IVPB 3.375 g  3.375 g Intravenous Q12H Murlean Iba, MD 12.5 mL/hr at 07/14/18 0129 3.375 g at 07/14/18 0129   . polyethylene glycol (MIRALAX / GLYCOLAX) packet 17 g  17 g Oral Daily Harrie Foreman, MD   17 g at 07/14/18 8101  . sertraline (ZOLOFT) tablet 25 mg  25 mg Oral Daily Harrie Foreman, MD   25 mg at 07/14/18 0857  . traMADol (ULTRAM) tablet 50 mg  50 mg Oral Q6H PRN Henreitta Leber, MD   50 mg at 07/14/18 1206  . traZODone (DESYREL) tablet 50 mg  50 mg Oral QHS Harrie Foreman, MD   50 mg at 07/13/18 2243  . vitamin B-12 (CYANOCOBALAMIN) tablet 1,000 mcg  1,000 mcg Oral Daily Harrie Foreman, MD   1,000 mcg at 07/14/18 7510   Past Medical History:  Diagnosis Date  . Diabetes mellitus without complication ([place])   . Hypertension   . Renal disorder    Past Surgical History:  Procedure Laterality Date  . peritonal dialysis     Social History Social History   Tobacco Use  . Smoking status: Former Research scientist (life sciences)  . Smokeless tobacco: Former Systems developer    Quit date: 07/16/2009  Substance Use Topics  . Alcohol use: No    Frequency: Never  . Drug use: No   Family History Family History  Problem Relation Age of Onset  . Thyroid disease Mother   . Stroke Father   . Cancer Father   . Diabetes Father   Denies family history of peripheral artery disease, venous disease or renal disease.  No Known Allergies  REVIEW OF SYSTEMS (Negative unless checked)  Constitutional: _0 Weight loss  _1 Fever  _2 Chills Cardiac: _3 Chest pain   _4 Chest pressure   _5 Palpitations   _6 Shortness of breath when laying flat   _7 Shortness of breath at rest   _8 Shortness of breath with exertion. Vascular:  _9 Pain in legs with walking   _10 Pain in legs at rest   _11 Pain in legs when laying flat   _12 Claudication   _13 Pain in feet when walking  _14 Pain in feet at rest  _15 Pain in feet when laying flat   _16 History of DVT   _17 Phlebitis   _18 Swelling in legs   _19 Varicose veins   _20 Non-healing ulcers Pulmonary:   _21 Uses home oxygen   _22 Productive cough   _23 Hemoptysis   _24 Wheeze  _25 COPD   _26 Asthma Neurologic:  _27 Dizziness   _28 Blackouts   _29 Seizures   _30 History of stroke   _31 History of TIA  _32 Aphasia   _33 Temporary blindness   _34 Dysphagia   _35 Weakness or numbness in arms   _36 Weakness or numbness in legs Musculoskeletal:  _37 Arthritis   _38 Joint swelling   _39 Joint pain   _40 Low back pain Hematologic:  _41 Easy bruising  _42 Easy bleeding   _43 Hypercoagulable state   _44 Anemic  _45 Hepatitis Gastrointestinal:  _46 Blood in stool   _47 Vomiting blood  _48 Gastroesophageal reflux/heartburn   _49 Difficulty swallowing. Genitourinary:  _50   Chronic kidney disease   _0 Difficult urination  _1 Frequent urination  _2 Burning with urination   _3 Blood in urine Skin:  _4 Rashes   _5 Ulcers   _6 Wounds Psychological:  _7 History of anxiety   _8  History of major depression.  Physical Examination  Vitals:   07/14/18 0426 07/14/18 0900 07/14/18 0908 07/14/18 1210  BP: 125/70 (!) 128/57  140/67  Pulse: 82 88  89  Resp: 20   20  Temp: 98.9 F (37.2 C) 98.4 F (36.9 C) 98.4 F (36.9 C) 98.2 F (36.8 C)  TempSrc: Oral   Oral  SpO2: 91% 93%  90%  Weight:      Height:       Body mass index is 38.97 kg/m. Gen:  WD/WN, NAD Head: [place]/AT, No temporalis wasting. Prominent temp pulse not noted. Ear/Nose/Throat: Hearing grossly intact, nares w/o erythema or drainage, oropharynx w/o Erythema/Exudate Eyes: Sclera non-icteric, conjunctiva clear Neck: Trachea midline.  No JVD.  Pulmonary:  Good air movement, respirations not labored, equal bilaterally.  Cardiac: RRR, normal S1, S2. Vascular:  Vessel Right Left  Radial Palpable Palpable  Ulnar Palpable Palpable  Brachial Palpable Palpable  Carotid Palpable, without bruit Palpable, without bruit  Aorta Not palpable N/A  Femoral Palpable Palpable  Popliteal Palpable Palpable  PT Palpable Palpable  DP Palpable Palpable   Gastrointestinal: soft, non-tender/non-distended. No guarding/reflex. PD catheter present. Musculoskeletal: M/S 5/5 throughout.  Extremities without ischemic changes.  No deformity or  atrophy. No edema. Neurologic: Sensation grossly intact in extremities.  Symmetrical.  Speech is fluent. Motor exam as listed above. Psychiatric: Judgment intact, Mood & affect appropriate for pt's clinical situation. Dermatologic: No rashes or ulcers noted.  No cellulitis or open wounds. Lymph : No Cervical, Axillary, or Inguinal lymphadenopathy.  CBC Lab Results  Component Value Date   WBC 8.6 07/13/2018   HGB 8.4 (L) 07/13/2018   HCT 26.2 (L) 07/13/2018   MCV 100.0 07/13/2018   PLT 128 (L) 07/13/2018   BMET    Component Value Date/Time   NA 132 (L) 07/10/2018 2142   NA 142 04/30/2014 1244   K 4.6 07/10/2018 2142   K 4.3 04/30/2014 1244   CL 96 (L) 07/10/2018 2142   CL 109 (H) 04/30/2014 1244   CO2 25 07/10/2018 2142   CO2 25 04/30/2014 1244   GLUCOSE 349 (H) 07/10/2018 2142   GLUCOSE 94 04/30/2014 1244   BUN 56 (H) 07/10/2018 2142   BUN 61 (H) 04/30/2014 1244   CREATININE 9.89 (H) 07/10/2018 2142   CREATININE 4.40 (H) 04/30/2014 1244   CALCIUM 9.6 07/10/2018 2142   CALCIUM 7.8 (L) 04/30/2014 1244   GFRNONAA 6 (L) 07/10/2018 2142   GFRNONAA 16 (L) 04/30/2014 1244   GFRAA 7 (L) 07/10/2018 2142   GFRAA 19 (L) 04/30/2014 1244   Estimated Creatinine Clearance: 11.7 mL/min (A) (by C-G formula based on SCr of 9.89 mg/dL (H)).  COAG No results found for: INR, PROTIME  Radiology No results found.  Assessment/Plan The patient is a 48 year old male with a past medical history of diabetes, hypertension, end-stage renal disease on peritoneal dialysis with a recent hospitalization for peritonitis presents to the emergency department on July 10, 2018 complaining of progressively worsening abdominal pain.  The patient was diagnosed with peritonitis and found to have Pseudomonas in his peritoneal fluid culture.  1. Peritonitis with Pseudomonas: Pseudomonas was found in the patient's peritoneal fluid.  Recommend removal of the likely source, the peritoneal dialysis catheter.   Once the patient's catheter is removed  he will not have a dialysis access.  Recommend placing a PermCath so the patient has a temporary dialysis access while planning for a permanent one.  Procedure, risks and benefits explained to the patient.  All questions answered.  The patient wishes to proceed.  Planning for Monday pending OR schedule with Dr. Lucky Cowboy 2. Diabetes: Encouraged good control as its slows the progression of atherosclerotic disease 3. Hypertension: Encouraged good control as its slows the progression of atherosclerotic disease  Discussed with Dr. Mayme Genta, PA-C  07/14/2018 2:40 PM  This note was created with Dragon medical transcription system.  Any error is purely unintentional.

## 2018-07-14 NOTE — Progress Notes (Signed)
ID Pseudomonas in the peritoneal fluid again Has peritonitis secondary to PD catheter. Nephrologist consulting vascular to remove catheter and get line for dialysis. Currently in zosyn - may need antibiotics upto 2- 4weeks.  As long as catheter in can be done intra peritoneal. If PD out then can be done IV and can be dosed during dialysis on discharge if sensitive to ceftazidime ( cefepime) Discussed with dr.Singh ID will sign of call if needed

## 2018-07-14 NOTE — Care Management Important Message (Signed)
Copy of signed Medicare IM left with patient in room. 

## 2018-07-14 NOTE — Progress Notes (Signed)
Patient assessed. Asymptomatic at this time. Radial pulse assessed 84 bpm regular rate. Patient resting quietly in bed. Jake Gomez

## 2018-07-15 LAB — BODY FLUID CULTURE

## 2018-07-15 LAB — GLUCOSE, CAPILLARY
Glucose-Capillary: 293 mg/dL — ABNORMAL HIGH (ref 70–99)
Glucose-Capillary: 201 mg/dL — ABNORMAL HIGH (ref 70–99)
Glucose-Capillary: 163 mg/dL — ABNORMAL HIGH (ref 70–99)
Glucose-Capillary: 144 mg/dL — ABNORMAL HIGH (ref 70–99)

## 2018-07-15 LAB — BASIC METABOLIC PANEL
Anion gap: 12 (ref 5–15)
BUN: 55 mg/dL — ABNORMAL HIGH (ref 6–20)
CHLORIDE: 91 mmol/L — AB (ref 98–111)
CO2: 27 mmol/L (ref 22–32)
Calcium: 9.2 mg/dL (ref 8.9–10.3)
Creatinine, Ser: 11.39 mg/dL — ABNORMAL HIGH (ref 0.61–1.24)
GFR calc Af Amer: 6 mL/min — ABNORMAL LOW (ref >60)
GFR calc non Af Amer: 5 mL/min — ABNORMAL LOW (ref >60)
Glucose, Bld: 198 mg/dL — ABNORMAL HIGH (ref 70–99)
Potassium: 4 mmol/L (ref 3.5–5.1)
Sodium: 130 mmol/L — ABNORMAL LOW (ref 135–145)

## 2018-07-15 LAB — CBC WITH DIFFERENTIAL/PLATELET
Abs Immature Granulocytes: 0.19 10*3/uL — ABNORMAL HIGH (ref 0.00–0.07)
Basophils Absolute: 0 10*3/uL (ref 0.0–0.1)
Basophils Relative: 0 %
Eosinophils Absolute: 0.2 10*3/uL (ref 0.0–0.5)
Eosinophils Relative: 1 %
HCT: 26.3 % — ABNORMAL LOW (ref 39.0–52.0)
HEMOGLOBIN: 8.4 g/dL — AB (ref 13.0–17.0)
Immature Granulocytes: 1 %
LYMPHS PCT: 10 %
Lymphs Abs: 1.4 10*3/uL (ref 0.7–4.0)
MCH: 31.8 pg (ref 26.0–34.0)
MCHC: 31.9 g/dL (ref 30.0–36.0)
MCV: 99.6 fL (ref 80.0–100.0)
MONO ABS: 1.2 10*3/uL — AB (ref 0.1–1.0)
Monocytes Relative: 8 %
NEUTROS PCT: 80 %
NRBC: 0 % (ref 0.0–0.2)
Neutro Abs: 11.7 10*3/uL — ABNORMAL HIGH (ref 1.7–7.7)
Platelets: 152 10*3/uL (ref 150–400)
RBC: 2.64 MIL/uL — AB (ref 4.22–5.81)
RDW: 16.3 % — ABNORMAL HIGH (ref 11.5–15.5)
WBC: 14.7 10*3/uL — ABNORMAL HIGH (ref 4.0–10.5)

## 2018-07-15 MED ORDER — LACTULOSE 10 GM/15ML PO SOLN
30.0000 g | Freq: Every day | ORAL | Status: DC
  Administered 2018-07-20 – 2018-07-22 (×3): 30 g via ORAL
  Filled 2018-07-15 (×5): qty 60

## 2018-07-15 MED ORDER — MORPHINE SULFATE (PF) 2 MG/ML IV SOLN
2.0000 mg | INTRAVENOUS | Status: DC | PRN
  Administered 2018-07-15 – 2018-07-22 (×24): 2 mg via INTRAVENOUS
  Filled 2018-07-15 (×27): qty 1

## 2018-07-15 MED ORDER — DELFLEX-LC/2.5% DEXTROSE 394 MOSM/L IP SOLN
Freq: Once | INTRAVENOUS_CENTRAL | Status: DC
  Filled 2018-07-15: qty 3000

## 2018-07-15 MED ORDER — CEFTAZIDIME 200 MG/ML FOR DIALYSIS
2250.0000 mg | INJECTION | Freq: Once | INTRAVENOUS_CENTRAL | Status: DC
  Filled 2018-07-15: qty 11.25

## 2018-07-15 MED ORDER — DIANEAL PD-2/2.5% DEXTROSE 396 MOSM/L IP SOLN
3000.0000 mL | Freq: Once | INTRAPERITONEAL | Status: DC
  Filled 2018-07-15: qty 3000

## 2018-07-15 MED ORDER — TOBRAMYCIN SULFATE 80 MG/2ML IJ SOLN
100.0000 mg | Freq: Once | INTRAVENOUS | Status: AC
  Administered 2018-07-15: 100 mg via INTRAVENOUS
  Filled 2018-07-15: qty 2.5

## 2018-07-15 NOTE — Progress Notes (Signed)
PT Cancellation Note  Patient Details Name: Jake Gomez MRN: [id number] DOB: 11-29-1970   Cancelled Treatment:     Patient currently receiving dialysis. Will re-attempt later today if time allows. Otherwise will re-attempt tomorrow.    Arsh Feutz 07/15/2018, 11:42 AM

## 2018-07-15 NOTE — Progress Notes (Addendum)
[place] [facility] [place] NAME: Jake Gomez    MR#:  [id number]  DATE OF BIRTH:  01-10-1971  SUBJECTIVE: Complains of more abdominal pain, poor p.o. intake.  Has low-grade temperature today.   REVIEW OF SYSTEMS:    Review of Systems  Constitutional: Positive for fever. Negative for chills.  HENT: Negative for congestion and tinnitus.   Eyes: Negative for blurred vision and double vision.  Respiratory: Negative for cough, shortness of breath and wheezing.   Cardiovascular: Negative for chest pain, orthopnea and PND.  Gastrointestinal: Positive for abdominal pain. Negative for diarrhea, nausea and vomiting.  Genitourinary: Negative for dysuria and hematuria.  Neurological: Negative for dizziness, sensory change and focal weakness.  All other systems reviewed and are negative.   Nutrition: Renal/Carb modified Tolerating Diet: Yes Tolerating PT: Await Eval.   DRUG ALLERGIES:  No Known Allergies  VITALS:  Blood pressure 112/66, pulse 83, temperature 98.2 F (36.8 C), temperature source Oral, resp. rate 20, height 5\' 8"  (1.727 m), weight 116.2 kg, SpO2 95 %.  PHYSICAL EXAMINATION:   Physical Exam  GENERAL:  48 y.o.-year-old obese patient lying in bed appears uncomfortable because of abdominal pain. EYES: Pupils equal, round, reactive to light and accommodation. No scleral icterus. Extraocular muscles intact.  HEENT: Head atraumatic, normocephalic. Oropharynx and nasopharynx clear.  NECK:  Supple, no jugular venous distention. No thyroid enlargement, no tenderness.  LUNGS: Normal breath sounds bilaterally, no wheezing, rales, rhonchi. No use of accessory muscles of respiration.  CARDIOVASCULAR: S1, S2 normal. No murmurs, rubs, or gallops.  ABDOMEN: Soft, Tender in epigastric area, no rebound, rigidity,  nondistended. Bowel sounds present. No organomegaly or mass.  + PD cath in place.  EXTREMITIES: No cyanosis, clubbing or edema b/l.      NEUROLOGIC: Cranial nerves II through XII are intact. No focal Motor or sensory deficits b/l.   PSYCHIATRIC: The patient is alert and oriented x 3.  SKIN: No obvious rash, lesion, or ulcer.    LABORATORY PANEL:   CBC Recent Labs  Lab 07/13/18 0457  WBC 8.6  HGB 8.4*  HCT 26.2*  PLT 128*   ------------------------------------------------------------------------------------------------------------------  Chemistries  Recent Labs  Lab 07/10/18 2142  NA 132*  K 4.6  CL 96*  CO2 25  GLUCOSE 349*  BUN 56*  CREATININE 9.89*  CALCIUM 9.6  AST 35  ALT 56*  ALKPHOS 206*  BILITOT 0.8   ------------------------------------------------------------------------------------------------------------------  Cardiac Enzymes No results for input(s): TROPONINI in the last 168 hours. ------------------------------------------------------------------------------------------------------------------  RADIOLOGY:  No results found.   ASSESSMENT AND PLAN:   48 year old male with past medical history of end-stage renal disease on peritoneal dialysis, hypertension, diabetes, hyperlipidemia, previous history of Pseudomonas peritonitis who presents to the hospital due to abdominal pain.  1.  Abdominal pain- due to recurrent peritonitis, peritoneal fluid this time also showing Pseudomonas.,  Patient had history of Pseudomonas infection from peritoneal fluid 2 times before.  Continue Zosyn, plan to get peritoneal dialysis catheter removed on Monday by vascular, continue double coverage today for peritonitis secondary to Pseudomonas, patient still spiking.  Nephrology thinking of adding gentamicin in addition to Zosyn.  2.  End-stage renal disease on peritoneal dialysis- p nephrology is following, plan is to remove peritoneal dialysis catheter on Monday.  3.  Essential hypertension- continue Coreg, Avapro  4.  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   - BS stable.   5.  Depression-continue  Zoloft.  6.  Hyperlipidemia-continue atorvastatin.  7.  Secondary hyperparathyroidism-continue PhosLo.  #7 abdominal pain secondary to peritonitis, patient continues to request tramadol, morphine.  Adjusted dose of morphine.  All the records are reviewed and case discussed with Care Management/Social Worker. Management plans discussed with the patient, family and they are in agreement.  CODE STATUS: Full code  DVT Prophylaxis: Hep. SQ  TOTAL TIME TAKING CARE OF THIS PATIENT: 30 minutes.    more than 50% time spent in counseling, coordination of care     M.D on 07/15/2018 at 10:04 AM  Between 7am to 6pm - Pager - [phone]  After 6pm go to [email or web address] - Proofreader  [facility]  Office  [phone]  CC: Primary care physician; Juline Patch, MD

## 2018-07-15 NOTE — Progress Notes (Signed)
OT Cancellation Note  Patient Details Name: Jake Gomez MRN: [id number] DOB: 1970/09/24   Cancelled Treatment:    Reason Eval/Treat Not Completed: Medical issues which prohibited therapy(Pt. Is  having dialysis treatment. Will continue to monitor, and eval at a later time/date. Pt. is planning for surgery on Monday 07/17/2018 to remove the peritoneal dialysis Catheter, and have a Permacath placed.)  Harrel Carina, MS, OTR/L 07/15/2018, 10:12 AM

## 2018-07-15 NOTE — Progress Notes (Signed)
[facility] [place] 07/15/18  Subjective:   Patient continues to have abdominal pain.  Appetite remains low PD fluid cultures growing Pseudomonas  Objective:  Vital signs in last 24 hours:  Temp:  [98.2 F (36.8 C)-100.5 F (38.1 C)] 98.2 F (36.8 C) (02/01 0526) Pulse Rate:  [83-89] 83 (02/01 0827) Resp:  [20] 20 (02/01 0526) BP: (97-140)/(53-67) 112/66 (02/01 0827) SpO2:  [90 %-95 %] 95 % (02/01 0526) Weight:  [116.2 kg] 116.2 kg (02/01 0500)  Weight change:  Filed Weights   07/11/18 0443 07/12/18 0500 07/15/18 0500  Weight: 114.7 kg 116.3 kg 116.2 kg    Intake/Output:    Intake/Output Summary (Last 24 hours) at 07/15/2018 1131 Last data filed at 07/15/2018 0500 Gross per 24 hour  Intake 14228.37 ml  Output 0 ml  Net 14228.37 ml     Physical Exam: General:  No acute distress, laying in the bed  HEENT  moist oral mucous membranes  Neck  supple  Pulm/lungs  normal breathing effort on room air, clear to auscultation  CVS/Heart  regular rhythm  Abdomen:   Soft, mild diffuse tenderness, some exit site tenderness  Extremities:  No edema  Neurologic:  Alert, oriented  Skin:  No acute rashes  Access:  PD catheter in place       Basic Metabolic Panel:  Recent Labs  Lab 07/10/18 2142  NA 132*  K 4.6  CL 96*  CO2 25  GLUCOSE 349*  BUN 56*  CREATININE 9.89*  CALCIUM 9.6     CBC: Recent Labs  Lab 07/10/18 2142 07/12/18 0354 07/13/18 0457  WBC 12.7* 12.5* 8.6  NEUTROABS 11.2*  --   --   HGB 11.2* 8.7* 8.4*  HCT 34.4* 27.7* 26.2*  MCV 97.7 99.3 100.0  PLT 177 151 128*      Lab Results  Component Value Date   HEPBSAG Negative 05/05/2018    Results for Jake Gomez, Jake Gomez (MRN [id number]) as of 07/12/2018 10:22  Ref. Range 07/11/2018 21:36  WBC, Fluid Latest Units: cu mm 4,121  Lymphs, Fluid Latest Units: % 5  Eos, Fluid Latest Units: % 2  Appearance, Fluid Latest Ref Range: CLEAR  HAZY (A)  Other Cells, Fluid Latest  Units: % 0  Neutrophil Count, Fluid Latest Units: % 92  Monocyte-Macrophage-Serous Fluid Latest Units: % 1    Microbiology:  Recent Results (from the past 240 hour(s))  Body fluid culture     Status: None   Collection Time: 07/11/18  9:35 PM  Result Value Ref Range Status   Specimen Description FLUID PERITONEAL  Final   Special Requests NONE  Final   Gram Stain   Final    WBC PRESENT, PREDOMINANTLY PMN GRAM NEGATIVE RODS CYTOSPIN SMEAR Performed at [facility], [address] [place], [place] [address]    Culture FEW PSEUDOMONAS AERUGINOSA  Final   Report Status 07/15/2018 FINAL  Final   Organism ID, Bacteria PSEUDOMONAS AERUGINOSA  Final      Susceptibility   Pseudomonas aeruginosa - MIC*    CEFTAZIDIME <=1 SENSITIVE Sensitive     CIPROFLOXACIN <=0.25 SENSITIVE Sensitive     GENTAMICIN <=1 SENSITIVE Sensitive     IMIPENEM 2 SENSITIVE Sensitive     PIP/TAZO 8 SENSITIVE Sensitive     CEFEPIME <=1 SENSITIVE Sensitive     * FEW PSEUDOMONAS AERUGINOSA    Coagulation Studies: No results for input(s): LABPROT, INR in the last 72 hours.  Urinalysis: No results for input(s):  COLORURINE, LABSPEC, [place], GLUCOSEU, HGBUR, BILIRUBINUR, KETONESUR, PROTEINUR, UROBILINOGEN, NITRITE, LEUKOCYTESUR in the last 72 hours.  Invalid input(s): APPERANCEUR    Imaging: No results found.   Medications:   . sodium chloride 250 mL (07/15/18 0146)  . dialysis solution 2.5% low-MG/low-CA    . piperacillin-tazobactam (ZOSYN)  IV 3.375 g (07/15/18 0147)   . atorvastatin  80 mg Oral q morning - 10a  . calcium acetate  2,001 mg Oral TID WC  . carvedilol  6.25 mg Oral BID WC  . docusate sodium  100 mg Oral BID  . epoetin (EPOGEN/PROCRIT) injection  20,000 Units Intravenous Weekly  . fluticasone  2 spray Each Nare Daily  . folic acid  1 mg Oral Daily  . gentamicin cream  1 application Topical Daily  . heparin  5,000 Units Subcutaneous Q8H  . insulin aspart  0-5 Units Subcutaneous  QHS  . insulin aspart  0-9 Units Subcutaneous TID WC  . irbesartan  300 mg Oral QHS  . polyethylene glycol  17 g Oral Daily  . sertraline  25 mg Oral Daily  . traZODone  50 mg Oral QHS  . cyanocobalamin  1,000 mcg Oral Daily   sodium chloride, acetaminophen **OR** acetaminophen, morphine injection, ondansetron **OR** ondansetron (ZOFRAN) IV, traMADol  Assessment/ Plan:  48 y.o.hispanic male with end-stage renal disease, diabetes, hypertension, secondary hyperparathyroidism, hospitalization for Pseudomonas peritonitis in November 2019 at [facility],  [name]/111 kg/4 fills x 3000 cc, last fill 2500 (icodextrine), manual exchange 2500 cc   1.  ESRD, peritoneal dialysis 2.  Abdominal pain with cloudy fluid, acute pseudomonal peritonitis 3.  Anemia of chronic kidney disease 4.  Diabetes with CKD 5.  Secondary hyperparathyroidism  Patient is currently being treated with IV Zosyn. Microbiology-PD fluid growing Pseudomonas. Patient still c/o pain. Add iv tobramycin and iv ceftazidime  Plan for PD catheter removal and placement of a new PermCath In the meantime, we will continue PD until new access is available Plan for outpatient hemodialysis Continue home dose of binders EPO SQ weekly (Wed)    LOS: [place] 2/1/202011:31 AM  [place], [place]  Note: This note was prepared with Dragon dictation. Any transcription errors are unintentional

## 2018-07-15 NOTE — Progress Notes (Signed)
Subjective/Objective This is a 48 year old male patient with end-stage renal disease who comes in with complaints of abdominal pain.  The patient is currently dialyzed through a PD catheter.  He is scheduled to have the PD catheter removed on Monday and a temporary dialysis catheter placed.  On clinical examination the patient seems to be slightly lethargic but arousable and responds to commands and is oriented x3.  Lungs are clear bilaterally heart regular rhythm abdomen is large due to obesity.  The PD catheter is in place without evidence of cellulitis.  On palpation the patient exerts signs of severe pain and has positive rebound tenderness.  On auscultation of the abdomen there are bowel sounds and the patient states that he is moving his bowels and passing gas.  His lower extremity examination is unremarkable. Scheduled Meds: . atorvastatin  80 mg Oral q morning - 10a  . calcium acetate  2,001 mg Oral TID WC  . carvedilol  6.25 mg Oral BID WC  . cefTAZidime  2,000 mg Intraperitoneal Once  . docusate sodium  100 mg Oral BID  . epoetin (EPOGEN/PROCRIT) injection  20,000 Units Intravenous Weekly  . fluticasone  2 spray Each Nare Daily  . folic acid  1 mg Oral Daily  . gentamicin cream  1 application Topical Daily  . heparin  5,000 Units Subcutaneous Q8H  . insulin aspart  0-5 Units Subcutaneous QHS  . insulin aspart  0-9 Units Subcutaneous TID WC  . irbesartan  300 mg Oral QHS  . polyethylene glycol  17 g Oral Daily  . sertraline  25 mg Oral Daily  . traZODone  50 mg Oral QHS  . cyanocobalamin  1,000 mcg Oral Daily   Continuous Infusions: . sodium chloride 250 mL (07/15/18 0146)  . dialysis solution 2.5% low-MG/low-CA    . tobramycin     PRN Meds:sodium chloride, acetaminophen **OR** acetaminophen, morphine injection, ondansetron **OR** ondansetron (ZOFRAN) IV, traMADol  Vital signs in last 24 hours: Temp:  [98.2 F (36.8 C)-100.5 F (38.1 C)] 98.4 F (36.9 C) (02/01 1203) Pulse  Rate:  [76-88] 76 (02/01 1203) Resp:  [19-20] 19 (02/01 1203) BP: (97-137)/(53-66) 121/64 (02/01 1203) SpO2:  [90 %-95 %] 91 % (02/01 1203) Weight:  [116.2 kg] 116.2 kg (02/01 0500)  Intake/Output last 3 shifts: I/O last 3 completed shifts: In: 14479.8 [I.V.:155.3; Other:14000; IV Piggyback:324.6] Out: 0  Intake/Output this shift: No intake/output data recorded.  Problem Assessment/Plan No new Assessment & Plan notes have been filed under this hospital service since the last note was generated. Service: Surgery  I will order a CBC and a BMP.  Patient may require to have the PD catheter removed sooner.  Patient will continue with all medications.  I will follow-up after his labs are completed.

## 2018-07-15 NOTE — Progress Notes (Signed)
CCP Tx started w/o issue, pt denies c/o sob, pain.    07/14/18 2350  Cycler Setup  Total Number of Exchanges 5  Fill Volume 2800  Dianeal Solution Dextrose 2.5% in 6000 mL  Last Fill Volume 0  Fill Time - Minute(s) 10  Dwell Time - Hour(s) 1  Dwell Time - Minute(s) 54  Drain Time - Minute(s) 20 mins  Fluid Balance - CCPD  Total Intake for Exchanges (mL) 14000 ml  Education / Care Plan  Dialysis Education Provided Yes  Documented Education in Care Plan Yes  Hand-Off documentation  Report given to (Full Name) Beatris Ship, RN   Report received from (Full Name) Mammie Russian, RN

## 2018-07-16 LAB — GLUCOSE, CAPILLARY
Glucose-Capillary: 223 mg/dL — ABNORMAL HIGH (ref 70–99)
Glucose-Capillary: 191 mg/dL — ABNORMAL HIGH (ref 70–99)
Glucose-Capillary: 172 mg/dL — ABNORMAL HIGH (ref 70–99)
Glucose-Capillary: 169 mg/dL — ABNORMAL HIGH (ref 70–99)

## 2018-07-16 MED ORDER — TOBRAMYCIN VARIABLE DOSE PER UNSTABLE RENAL FUNCTION (PHARMACIST DOSING): Status: DC

## 2018-07-16 MED ORDER — SODIUM CHLORIDE 0.9 % IV SOLN
1.0000 g | INTRAVENOUS | Status: DC
  Administered 2018-07-16 – 2018-07-20 (×5): 1 g via INTRAVENOUS
  Filled 2018-07-16 (×7): qty 1

## 2018-07-16 NOTE — Progress Notes (Signed)
   Subjective/Chief Complaint: Patient is seen and examined this morning.  Patient denies any nausea vomiting.  He states that he is moving his bowels and having flatus without difficulty.  He continues to complain of abdominal pain.  CBC yesterday showed elevated white blood count of 14.7 with a left shift.  Hemoglobin is 8.4    Objective: Vital signs in last 24 hours: Temp:  [98.1 F (36.7 C)-99.5 F (37.5 C)] 98.5 F (36.9 C) (02/02 0829) Pulse Rate:  [76-84] 79 (02/02 0829) Resp:  [16-20] 16 (02/02 0829) BP: (114-122)/(50-70) 122/70 (02/02 0829) SpO2:  [90 %-93 %] 92 % (02/02 0829) Last BM Date: 07/15/18  Intake/Output from previous day: 02/01 0701 - 02/02 0700 In: 14000  Out: 14391  Intake/Output this shift: Total I/O In: 120 [P.O.:120] Out: 14391 [Other:14391]  Clinical examination the patient is alert and oriented x3 not in acute distress.  He is more responsive than yesterday due to less narcotic medication.  Patient's lungs are clear heart regular rhythm abdomen diffusely tender positive bowel sounds and positive rebound tenderness as yesterday.  Upper and lower extremity examinations are unremarkable.    Lab Results:  Recent Labs    07/15/18 1247  WBC 14.7*  HGB 8.4*  HCT 26.3*  PLT 152   BMET Recent Labs    07/15/18 1247  NA 130*  K 4.0  CL 91*  CO2 27  GLUCOSE 198*  BUN 55*  CREATININE 11.39*  CALCIUM 9.2   PT/INR No results for input(s): LABPROT, INR in the last 72 hours. ABG No results for input(s): PHART, HCO3 in the last 72 hours.  Invalid input(s): PCO2, PO2  Studies/Results: No results found.  Anti-infectives: Anti-infectives (From admission, onward)   Start     Dose/Rate Route Frequency Ordered Stop   07/15/18 1730  cefTAZidime (FORTAZ) 2,000 mg in dialysis solution 2.5% low-MG/low-CA 3,000 mL dialysis solution      Peritoneal Dialysis Once in dialysis 07/15/18 1719     07/15/18 1345  tobramycin (NEBCIN) 100 mg in dextrose 5 %  50 mL IVPB     100 mg 105 mL/hr over 30 Minutes Intravenous  Once 07/15/18 1142 07/15/18 1825   07/15/18 1345  cefTAZidime (FORTAZ) 200 mg/mL injection 2,250 mg  Status:  Discontinued     2,250 mg Intraperitoneal  Once 07/15/18 1142 07/15/18 1719   07/11/18 1600  vancomycin (VANCOCIN) 2,000 mg in sodium chloride 0.9 % 500 mL IVPB     2,000 mg 250 mL/hr over 120 Minutes Intravenous  Once 07/11/18 1307 07/11/18 1845   07/11/18 1400  piperacillin-tazobactam (ZOSYN) IVPB 3.375 g  Status:  Discontinued     3.375 g 12.5 mL/hr over 240 Minutes Intravenous Every 12 hours 07/11/18 1307 07/15/18 1142   07/11/18 0030  piperacillin-tazobactam (ZOSYN) IVPB 3.375 g     3.375 g 100 mL/hr over 30 Minutes Intravenous  Once 07/11/18 0025 07/11/18 0120      Assessment/Plan: s/p Procedure(s): REMOVAL OF A DIALYSIS CATHETER (N/A) INSERTION OF DIALYSIS CATHETER (N/A) Patient is on the schedule for removal of peritoneal dialysis catheter tomorrow.  We will continue to observe the patient conservatively at this point.  LOS: 5 days    Elmore Guise 07/16/2018

## 2018-07-16 NOTE — Progress Notes (Signed)
CCPD Tx completed, tolerated well, UF 374mL, CCPD, discontinued fortonight as pt will have perm cath placed tomorrow and will transition to HD.    07/16/18 1000  Completion  Effluent Appearance Yellow  Treatment Status Complete  Fluid Balance - CCPD  Total Output for Exchanges (mL) 14391 ml  Procedure Comments  Tolerated treatment well? Yes  Hand-Off documentation  Report given to (Full Name) Miguel Rota, RN   Report received from (Full Name) Beatris Ship, RN

## 2018-07-16 NOTE — Progress Notes (Signed)
Pharmacy Antibiotic Note  Jake Gomez is a 48 y.o. male admitted on 07/10/2018 with peritonitis.  Pharmacy has been consulted for ceftazidime and tobramycin dosing.  Per Dialysis RN note this AM, CCPD discontinued for tonight as pt will have perm cath placed tomorrow and will transition to HD.  2/1 had orders for ceftazidime 2 g added to dialysis solution and tobramycin 100 mg IV x1.   Plan: Ceftazidime 1 g IV q24h  Will order random tobramycin level with AM labs (first dose appears to be lower than true loading dose).  Placeholder order "tobramycin variable dose per unstable renal function" entered. Will redose once level is <2 mg/L.    Height: 5\' 8"  (172.7 cm) Weight: 256 lb 2.8 oz (116.2 kg) IBW/kg (Calculated) : 68.4  Temp (24hrs), Avg:98.7 F (37.1 C), Min:98.1 F (36.7 C), Max:99.5 F (37.5 C)  Recent Labs  Lab 07/10/18 2142 07/12/18 0354 07/13/18 0457 07/14/18 0740 07/15/18 1247  WBC 12.7* 12.5* 8.6  --  14.7*  CREATININE 9.89*  --   --   --  11.39*  VANCORANDOM  --   --   --  18  --     Estimated Creatinine Clearance: 10.1 mL/min (A) (by C-G formula based on SCr of 11.39 mg/dL (H)).    No Known Allergies  Antimicrobials this admission: Zosyn 1/28>>2/1 Ceftazidime 2/1 >> Tobramycin 2/1 >>  Dose adjustments this admission:   Microbiology results: Peritoneal fluid cx pansensitive pseudomonas  Thank you for allowing pharmacy to be a part of this patient's care.  Rocky Morel 07/16/2018 3:23 PM

## 2018-07-16 NOTE — Progress Notes (Signed)
[place] at [place] NAME: Jake Gomez    MR#:  [id number]  DATE OF BIRTH:  1971/03/26  SUBJECTIVE: Has abdominal pain.   REVIEW OF SYSTEMS:    Review of Systems  Constitutional: Negative for chills, fever and weight loss.  HENT: Negative for congestion and tinnitus.   Eyes: Negative for blurred vision and double vision.  Respiratory: Negative for cough, shortness of breath and wheezing.   Cardiovascular: Negative for chest pain, orthopnea and PND.  Gastrointestinal: Positive for abdominal pain. Negative for diarrhea, nausea and vomiting.  Genitourinary: Negative for dysuria and hematuria.  Neurological: Negative for dizziness, sensory change and focal weakness.  All other systems reviewed and are negative.   Nutrition: Renal/Carb modified Tolerating Diet: Yes Tolerating PT: Await Eval.   DRUG ALLERGIES:  No Known Allergies  VITALS:  Blood pressure 122/70, pulse 79, temperature 98.5 F (36.9 C), temperature source Oral, resp. rate 16, height 5\' 8"  (1.727 m), weight 116.2 kg, SpO2 92 %.  PHYSICAL EXAMINATION:   Physical Exam  GENERAL:  48 y.o.-year-old obese patient lying in bed appears uncomfortable because of abdominal pain. EYES: Pupils equal, round, reactive to light and accommodation. No scleral icterus. Extraocular muscles intact.  HEENT: Head atraumatic, normocephalic. Oropharynx and nasopharynx clear.  NECK:  Supple, no jugular venous distention. No thyroid enlargement, no tenderness.  LUNGS: Normal breath sounds bilaterally, no wheezing, rales, rhonchi. No use of accessory muscles of respiration.  CARDIOVASCULAR: S1, S2 normal. No murmurs, rubs, or gallops.  ABDOMEN: Soft, Tender in epigastric area, no rebound, rigidity,  nondistended. Bowel sounds present. No organomegaly or mass.  + PD cath in place.  EXTREMITIES: No cyanosis, clubbing or edema b/l.    NEUROLOGIC: Cranial nerves II through XII are intact. No focal  Motor or sensory deficits b/l.   PSYCHIATRIC: The patient is alert and oriented x 3.  SKIN: No obvious rash, lesion, or ulcer.    LABORATORY PANEL:   CBC Recent Labs  Lab 07/15/18 1247  WBC 14.7*  HGB 8.4*  HCT 26.3*  PLT 152   ------------------------------------------------------------------------------------------------------------------  Chemistries  Recent Labs  Lab 07/10/18 2142 07/15/18 1247  NA 132* 130*  K 4.6 4.0  CL 96* 91*  CO2 25 27  GLUCOSE 349* 198*  BUN 56* 55*  CREATININE 9.89* 11.39*  CALCIUM 9.6 9.2  AST 35  --   ALT 56*  --   ALKPHOS 206*  --   BILITOT 0.8  --    ------------------------------------------------------------------------------------------------------------------  Cardiac Enzymes No results for input(s): TROPONINI in the last 168 hours. ------------------------------------------------------------------------------------------------------------------  RADIOLOGY:  No results found.   ASSESSMENT AND PLAN:   48 year old male with past medical history of end-stage renal disease on peritoneal dialysis, hypertension, diabetes, hyperlipidemia, previous history of Pseudomonas peritonitis who presents to the hospital due to abdominal pain.  1.  Abdominal pain- due to recurrent peritonitis, peritoneal fluid this time also showing Pseudomonas.,  Patient had history of Pseudomonas infection from peritoneal fluid 2 times before.  PD catheter removal tomorrow.   Vascular surgery is following.  2.  End-stage renal disease on peritoneal dialysis- p nephrology is following, plan is to remove peritoneal dialysis catheter on Monday.  3.  Essential hypertension- continue Coreg, Avapro  4.  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   - BS stable.   5.  Depression-continue Zoloft.  6.  Hyperlipidemia-continue atorvastatin.  7.  Secondary hyperparathyroidism-continue PhosLo.  #7  abdominal  pain secondary to peritonitis, patient continues to request tramadol, morphine.  Worsening leukocytosis, WBC up to 14.7 yesterday. All the records are reviewed and case discussed with Care Management/Social Worker. Management plans discussed with the patient, family and they are in agreement.  CODE STATUS: Full code  DVT Prophylaxis: Hep. SQ  TOTAL TIME TAKING CARE OF THIS PATIENT: 30 minutes.    more than 50% time spent in counseling, coordination of care     M.D on 07/16/2018 at 11:52 AM  Between 7am to 6pm - Pager - [phone]  After 6pm go to [email or web address] - Proofreader  [facility]  Office  [phone]  CC: Primary care physician; Juline Patch, MD

## 2018-07-16 NOTE — Evaluation (Signed)
Physical Therapy Evaluation Patient Details Name: Jake Gomez MRN: [id number] DOB: January 20, 1971 Today's Date: 07/16/2018   History of Present Illness  Pt is a 48 year old male with past medical history of end-stage renal disease on peritoneal dialysis, hypertension, diabetes, hyperlipidemia, previous history of Pseudomonas peritonitis who presents to the hospital due to abdominal pain 07/10/18. Peritoneal fluid this time also showing Pseudomonas. Reports he lives at home with parents and has not been working for some time, but has been previously independent with all mobility  Clinical Impression  Patient with current impairments in LE and abdominal strength, abdominal pain, decreased endurance, and decreased activity tolerance. Limited in transfer activities and ambulation; inhibiting full participation in community and home ADLs. Patient needs encouragement to attempt to perform all transfers ind, and demonstrates goot carry over following PT cuing for proper supine > sit > stand transfers. Patient with difficulty complying with cuing for safety with ambulation/RW management. Would benefit from skilled PT to address above deficits and promote optimal return to PLOF     Follow Up Recommendations SNF    Equipment Recommendations  Rolling walker with 5" wheels    Recommendations for Other Services       Precautions / Restrictions Precautions Precautions: Fall Restrictions Weight Bearing Restrictions: No      Mobility  Bed Mobility Overal bed mobility: Modified Independent             General bed mobility comments: Increased time needed, use of handrails. Mother attempts to help, PT asked her to allow patient to attempt to complete on his own  Transfers Overall transfer level: Needs assistance Equipment used: Rolling walker (2 wheeled) Transfers: Sit to/from Stand Sit to Stand: Mod assist         General transfer comment: Patient requires cues for proper hand placement, able  to initiate STS with modA to complete standing. Over 3 trials this improves to minA  Ambulation/Gait Ambulation/Gait assistance: Min guard Gait Distance (Feet): 20 Feet Assistive device: Rolling walker (2 wheeled) Gait Pattern/deviations: Shuffle Gait velocity: decreased   General Gait Details: Multiple cues to "stay inside walking"; increased pain 10/10 with gait  Stairs            Wheelchair Mobility    Modified Rankin (Stroke Patients Only)       Balance Overall balance assessment: Needs assistance   Sitting balance-Leahy Scale: Good       Standing balance-Leahy Scale: Fair Standing balance comment: Cannot tolerate any challenge in standing; able to remain standing without support 8sec                             Pertinent Vitals/Pain Pain Assessment: 0-10 Pain Score: 10-Worst pain ever Pain Location: abdomen  Pain Descriptors / Indicators: Aching Pain Intervention(s): Limited activity within patient's tolerance    Home Living Family/patient expects to be discharged to:: Private residence Living Arrangements: Parent Available Help at Discharge: Family;Available 24 hours/day Type of Home: House Home Access: Stairs to enter Entrance Stairs-Rails: Left Entrance Stairs-Number of Steps: 2 Home Layout: One level Home Equipment: Cane - single point;Walker - 2 wheels      Prior Function Level of Independence: Needs assistance   Gait / Transfers Assistance Needed: Pt reports independent mobility prior, with several noted falls (does admit fall while donning pants)  ADL's / Homemaking Assistance Needed: Ind with ADLs. Was driving.         Hand Dominance   Dominant Hand: Right  Extremity/Trunk Assessment   Upper Extremity Assessment Upper Extremity Assessment: Overall WFL for tasks assessed    Lower Extremity Assessment Lower Extremity Assessment: Generalized weakness    Cervical / Trunk Assessment Cervical / Trunk Assessment:  Normal  Communication   Communication: No difficulties  Cognition Arousal/Alertness: Awake/alert Behavior During Therapy: WFL for tasks assessed/performed Overall Cognitive Status: Within Functional Limits for tasks assessed                                        General Comments      Exercises Other Exercises Other Exercises: Led patient through supine > sit transfer allowing him to complete transfer with VC for technique. PT cued patient for safety/hand placement with RW for STS transfer over 3 trials where patient requires modA, progressing to minA with cuing for controlled descent. PT provided CGA to occassional minA to prevent LOB walking 27ft with continuous cuing for patient to "stay inside walker" and increase foot clearance with 75% carry over. PT educated patient on importance of movement and increasing tolerance and left RW in room with instructions to call nurse to ambulate to and from restroom. Patient and family verbalized understanding   Assessment/Plan    PT Assessment Patient needs continued PT services  PT Problem List Decreased strength;Pain;Decreased coordination;Decreased activity tolerance;Decreased balance;Decreased mobility;Decreased safety awareness       PT Treatment Interventions DME instruction;Therapeutic exercise;Gait training;Balance training;Manual techniques;Stair training;Neuromuscular re-education;Functional mobility training;Therapeutic activities;Patient/family education    PT Goals (Current goals can be found in the Care Plan section)  Acute Rehab PT Goals Patient Stated Goal: Ambulate and transfer ind to return to baseline PT Goal Formulation: With patient Time For Goal Achievement: 07/30/18    Frequency Min 2X/week   Barriers to discharge Decreased caregiver support Parents are older    Co-evaluation               AM-PAC PT "6 Clicks" Mobility  Outcome Measure Help needed turning from your back to your side while  in a flat bed without using bedrails?: A Lot Help needed moving from lying on your back to sitting on the side of a flat bed without using bedrails?: A Lot Help needed moving to and from a bed to a chair (including a wheelchair)?: A Lot Help needed standing up from a chair using your arms (e.g., wheelchair or bedside chair)?: A Lot Help needed to walk in hospital room?: A Lot Help needed climbing 3-5 steps with a railing? : Total 6 Click Score: 11    End of Session Equipment Utilized During Treatment: Gait belt Activity Tolerance: Patient limited by pain Patient left: with chair alarm set;in chair;with nursing/sitter in room;with family/visitor present;with call bell/phone within reach Nurse Communication: Mobility status PT Visit Diagnosis: Unsteadiness on feet (R26.81);History of falling (Z91.81);Muscle weakness (generalized) (M62.81);Repeated falls (R29.6);Difficulty in walking, not elsewhere classified (R26.2);Pain Pain - part of body: (abdomen)    Time: 8786-7672 PT Time Calculation (min) (ACUTE ONLY): 34 min   Charges:   PT Evaluation $PT Eval Moderate Complexity: 1 Mod PT Treatments $Therapeutic Activity: 8-22 mins       Shelton Silvas PT, DPT  Shelton Silvas 07/16/2018, 2:58 PM

## 2018-07-16 NOTE — Progress Notes (Signed)
[place], [place] 07/16/18  Subjective:   Patient continues to have abdominal pain.  Appetite remains low PD fluid cultures growing Pseudomonas  Objective:  Vital signs in last 24 hours:  Temp:  [98.1 F (36.7 C)-99.5 F (37.5 C)] 98.6 F (37 C) (02/02 1200) Pulse Rate:  [76-84] 76 (02/02 1200) Resp:  [16-20] 16 (02/02 1200) BP: (114-122)/(50-70) 119/70 (02/02 1200) SpO2:  [90 %-93 %] 91 % (02/02 1200)  Weight change:  Filed Weights   07/11/18 0443 07/12/18 0500 07/15/18 0500  Weight: 114.7 kg 116.3 kg 116.2 kg    Intake/Output:    Intake/Output Summary (Last 24 hours) at 07/16/2018 1353 Last data filed at 07/16/2018 1014 Gross per 24 hour  Intake 14120 ml  Output 28782 ml  Net -14662 ml     Physical Exam: General:  No acute distress, laying in the bed  HEENT  moist oral mucous membranes  Neck  supple  Pulm/lungs  normal breathing effort on room air, clear to auscultation  CVS/Heart  regular rhythm  Abdomen:   Soft, mild diffuse tenderness, some exit site tenderness  Extremities:  No edema  Neurologic:  Alert, oriented  Skin:  No acute rashes  Access:  PD catheter in place       Basic Metabolic Panel:  Recent Labs  Lab 07/10/18 2142 07/15/18 1247  NA 132* 130*  K 4.6 4.0  CL 96* 91*  CO2 25 27  GLUCOSE 349* 198*  BUN 56* 55*  CREATININE 9.89* 11.39*  CALCIUM 9.6 9.2     CBC: Recent Labs  Lab 07/10/18 2142 07/12/18 0354 07/13/18 0457 07/15/18 1247  WBC 12.7* 12.5* 8.6 14.7*  NEUTROABS 11.2*  --   --  11.7*  HGB 11.2* 8.7* 8.4* 8.4*  HCT 34.4* 27.7* 26.2* 26.3*  MCV 97.7 99.3 100.0 99.6  PLT 177 151 128* 152      Lab Results  Component Value Date   HEPBSAG Negative 05/05/2018    Results for Jake Gomez, Jake Gomez (MRN [id number]) as of 07/12/2018 10:22  Ref. Range 07/11/2018 21:36  WBC, Fluid Latest Units: cu mm 4,121  Lymphs, Fluid Latest Units: % 5  Eos, Fluid Latest Units: % 2  Appearance, Fluid Latest Ref  Range: CLEAR  HAZY (A)  Other Cells, Fluid Latest Units: % 0  Neutrophil Count, Fluid Latest Units: % 92  Monocyte-Macrophage-Serous Fluid Latest Units: % 1    Microbiology:  Recent Results (from the past 240 hour(s))  Body fluid culture     Status: None   Collection Time: 07/11/18  9:35 PM  Result Value Ref Range Status   Specimen Description FLUID PERITONEAL  Final   Special Requests NONE  Final   Gram Stain   Final    WBC PRESENT, PREDOMINANTLY PMN GRAM NEGATIVE RODS CYTOSPIN SMEAR Performed at [facility], [address] [place],  [address]    Culture FEW PSEUDOMONAS AERUGINOSA  Final   Report Status 07/15/2018 FINAL  Final   Organism ID, Bacteria PSEUDOMONAS AERUGINOSA  Final      Susceptibility   Pseudomonas aeruginosa - MIC*    CEFTAZIDIME <=1 SENSITIVE Sensitive     CIPROFLOXACIN <=0.25 SENSITIVE Sensitive     GENTAMICIN <=1 SENSITIVE Sensitive     IMIPENEM 2 SENSITIVE Sensitive     PIP/TAZO 8 SENSITIVE Sensitive     CEFEPIME <=1 SENSITIVE Sensitive     * FEW PSEUDOMONAS AERUGINOSA    Coagulation Studies: No results for input(s): LABPROT, INR in  the last 72 hours.  Urinalysis: No results for input(s): COLORURINE, LABSPEC, PHURINE, GLUCOSEU, HGBUR, BILIRUBINUR, KETONESUR, PROTEINUR, UROBILINOGEN, NITRITE, LEUKOCYTESUR in the last 72 hours.  Invalid input(s): APPERANCEUR    Imaging: No results found.   Medications:   . sodium chloride 250 mL (07/15/18 0146)  . dianeal solution for CAPD/CCPD with additives    . dialysis solution 2.5% low-MG/low-CA     . atorvastatin  80 mg Oral q morning - 10a  . calcium acetate  2,001 mg Oral TID WC  . carvedilol  6.25 mg Oral BID WC  . docusate sodium  100 mg Oral BID  . epoetin (EPOGEN/PROCRIT) injection  20,000 Units Intravenous Weekly  . fluticasone  2 spray Each Nare Daily  . folic acid  1 mg Oral Daily  . gentamicin cream  1 application Topical Daily  . heparin  5,000 Units Subcutaneous Q8H  .  insulin aspart  0-5 Units Subcutaneous QHS  . insulin aspart  0-9 Units Subcutaneous TID WC  . irbesartan  300 mg Oral QHS  . lactulose  30 g Oral Daily  . polyethylene glycol  17 g Oral Daily  . sertraline  25 mg Oral Daily  . traZODone  50 mg Oral QHS  . cyanocobalamin  1,000 mcg Oral Daily   sodium chloride, acetaminophen **OR** acetaminophen, morphine injection, ondansetron **OR** ondansetron (ZOFRAN) IV, traMADol  Assessment/ Plan:  48 y.o.hispanic male with end-stage renal disease, diabetes, hypertension, secondary hyperparathyroidism, hospitalization for Pseudomonas peritonitis in November 2019 at [facility],  [name]/111 kg/4 fills x 3000 cc, last fill 2500 (icodextrine), manual exchange 2500 cc   1.  ESRD, peritoneal dialysis 2.  Abdominal pain with cloudy fluid, acute pseudomonal peritonitis 3.  Anemia of chronic kidney disease 4.  Diabetes with CKD 5.  Secondary hyperparathyroidism  Patient is currently being treated with IV antibiotics Microbiology-PD fluid growing Pseudomonas. Patient still c/o pain. Added iv tobramycin and ceftazidime IP Plan for PD catheter removal and placement of a new PermCath In the meantime, we will continue PD until new access is available Discharge planning for outpatient hemodialysis Continue home dose of binders EPO SQ weekly (Wed)    LOS: [place] 2/2/20201:53 PM  [place], [place]  Note: This note was prepared with Dragon dictation. Any transcription errors are unintentional

## 2018-07-17 ENCOUNTER — Encounter
Admission: EM | Disposition: A | Discharge status: Home Health | Payer: Self-pay | Source: Home / Self Care | Attending: Internal Medicine

## 2018-07-17 ENCOUNTER — Inpatient Hospital Stay: Payer: Medicare Other | Admitting: Certified Registered"

## 2018-07-17 ENCOUNTER — Encounter: Payer: Self-pay | Admitting: *Deleted

## 2018-07-17 DIAGNOSIS — N186 End stage renal disease: Secondary | ICD-10-CM

## 2018-07-17 DIAGNOSIS — K659 Peritonitis, unspecified: Secondary | ICD-10-CM

## 2018-07-17 DIAGNOSIS — T8571XA Infection and inflammatory reaction due to peritoneal dialysis catheter, initial encounter: Secondary | ICD-10-CM

## 2018-07-17 HISTORY — PX: REMOVAL OF A DIALYSIS CATHETER: SHX6053

## 2018-07-17 LAB — BASIC METABOLIC PANEL
Anion gap: 13 (ref 5–15)
BUN: 61 mg/dL — ABNORMAL HIGH (ref 6–20)
CALCIUM: 10 mg/dL (ref 8.9–10.3)
CO2: 28 mmol/L (ref 22–32)
Chloride: 90 mmol/L — ABNORMAL LOW (ref 98–111)
Creatinine, Ser: 12.09 mg/dL — ABNORMAL HIGH (ref 0.61–1.24)
GFR calc Af Amer: 5 mL/min — ABNORMAL LOW (ref >60)
GFR calc non Af Amer: 4 mL/min — ABNORMAL LOW (ref >60)
Glucose, Bld: 169 mg/dL — ABNORMAL HIGH (ref 70–99)
Potassium: 4.6 mmol/L (ref 3.5–5.1)
Sodium: 131 mmol/L — ABNORMAL LOW (ref 135–145)

## 2018-07-17 LAB — CBC
HCT: 27.3 % — ABNORMAL LOW (ref 39.0–52.0)
Hemoglobin: 8.6 g/dL — ABNORMAL LOW (ref 13.0–17.0)
MCH: 31.6 pg (ref 26.0–34.0)
MCHC: 31.5 g/dL (ref 30.0–36.0)
MCV: 100.4 fL — ABNORMAL HIGH (ref 80.0–100.0)
PLATELETS: 224 10*3/uL (ref 150–400)
RBC: 2.72 MIL/uL — ABNORMAL LOW (ref 4.22–5.81)
RDW: 16.3 % — ABNORMAL HIGH (ref 11.5–15.5)
WBC: 12.6 10*3/uL — ABNORMAL HIGH (ref 4.0–10.5)
nRBC: 0 % (ref 0.0–0.2)

## 2018-07-17 LAB — APTT: aPTT: 39 seconds — ABNORMAL HIGH (ref 24–36)

## 2018-07-17 LAB — GLUCOSE, CAPILLARY
Glucose-Capillary: 154 mg/dL — ABNORMAL HIGH (ref 70–99)
Glucose-Capillary: 153 mg/dL — ABNORMAL HIGH (ref 70–99)
Glucose-Capillary: 132 mg/dL — ABNORMAL HIGH (ref 70–99)
Glucose-Capillary: 139 mg/dL — ABNORMAL HIGH (ref 70–99)
Glucose-Capillary: 206 mg/dL — ABNORMAL HIGH (ref 70–99)
Glucose-Capillary: 230 mg/dL — ABNORMAL HIGH (ref 70–99)

## 2018-07-17 LAB — PROTIME-INR
INR: 1.07
Prothrombin Time: 13.8 seconds (ref 11.4–15.2)

## 2018-07-17 LAB — SURGICAL PCR SCREEN
MRSA, PCR: NEGATIVE
Staphylococcus aureus: NEGATIVE

## 2018-07-17 LAB — TOBRAMYCIN LEVEL, RANDOM: Tobramycin Rm: 1.3 ug/mL

## 2018-07-17 SURGERY — LAPAROSCOPIC REMOVAL CONTINUOUS AMBULATORY PERITONEAL DIALYSIS  (CAPD) CATHETER
Anesthesia: Choice

## 2018-07-17 SURGERY — REMOVAL, DIALYSIS CATHETER
Anesthesia: General

## 2018-07-17 MED ORDER — LIDOCAINE HCL (PF) 2 % IJ SOLN
INTRAMUSCULAR | Status: AC
  Filled 2018-07-17: qty 10

## 2018-07-17 MED ORDER — EPHEDRINE SULFATE 50 MG/ML IJ SOLN
INTRAMUSCULAR | Status: AC
  Filled 2018-07-17: qty 1

## 2018-07-17 MED ORDER — EPHEDRINE SULFATE 50 MG/ML IJ SOLN
INTRAMUSCULAR | Status: DC | PRN
  Administered 2018-07-17: 10 mg via INTRAVENOUS

## 2018-07-17 MED ORDER — FENTANYL CITRATE (PF) 100 MCG/2ML IJ SOLN: 25.0000 ug | INTRAMUSCULAR | Status: DC | PRN

## 2018-07-17 MED ORDER — GLYCOPYRROLATE 0.2 MG/ML IJ SOLN
INTRAMUSCULAR | Status: AC
  Filled 2018-07-17: qty 1

## 2018-07-17 MED ORDER — MIDAZOLAM HCL 5 MG/5ML IJ SOLN
INTRAMUSCULAR | Status: DC | PRN
  Administered 2018-07-17: 2 mg via INTRAVENOUS

## 2018-07-17 MED ORDER — ONDANSETRON HCL 4 MG/2ML IJ SOLN
INTRAMUSCULAR | Status: AC
  Filled 2018-07-17: qty 2

## 2018-07-17 MED ORDER — ONDANSETRON HCL 4 MG/2ML IJ SOLN
INTRAMUSCULAR | Status: DC | PRN
  Administered 2018-07-17: 4 mg via INTRAVENOUS

## 2018-07-17 MED ORDER — DEXAMETHASONE SODIUM PHOSPHATE 10 MG/ML IJ SOLN
INTRAMUSCULAR | Status: AC
  Filled 2018-07-17: qty 1

## 2018-07-17 MED ORDER — FENTANYL CITRATE (PF) 100 MCG/2ML IJ SOLN
INTRAMUSCULAR | Status: AC
  Filled 2018-07-17: qty 2

## 2018-07-17 MED ORDER — DEXAMETHASONE SODIUM PHOSPHATE 10 MG/ML IJ SOLN
INTRAMUSCULAR | Status: DC | PRN
  Administered 2018-07-17: 5 mg via INTRAVENOUS

## 2018-07-17 MED ORDER — VASOPRESSIN 20 UNIT/ML IV SOLN
INTRAVENOUS | Status: DC | PRN
  Administered 2018-07-17 (×2): 2 [IU] via INTRAVENOUS

## 2018-07-17 MED ORDER — PHENYLEPHRINE HCL 10 MG/ML IJ SOLN
INTRAMUSCULAR | Status: DC | PRN
  Administered 2018-07-17 (×3): 100 ug via INTRAVENOUS
  Administered 2018-07-17 (×3): 200 ug via INTRAVENOUS

## 2018-07-17 MED ORDER — GLYCOPYRROLATE 0.2 MG/ML IJ SOLN
INTRAMUSCULAR | Status: DC | PRN
  Administered 2018-07-17: 0.2 mg via INTRAVENOUS

## 2018-07-17 MED ORDER — VASOPRESSIN 20 UNIT/ML IV SOLN
INTRAVENOUS | Status: AC
  Filled 2018-07-17: qty 1

## 2018-07-17 MED ORDER — LIDOCAINE HCL (PF) 2 % IJ SOLN
INTRAMUSCULAR | Status: DC | PRN
  Administered 2018-07-17: 50 mg

## 2018-07-17 MED ORDER — MUPIROCIN 2 % EX OINT
1.0000 "application " | TOPICAL_OINTMENT | Freq: Two times a day (BID) | CUTANEOUS | Status: DC
  Filled 2018-07-17: qty 22

## 2018-07-17 MED ORDER — PHENYLEPHRINE HCL 10 MG/ML IJ SOLN
INTRAMUSCULAR | Status: AC
  Filled 2018-07-17: qty 1

## 2018-07-17 MED ORDER — PROPOFOL 10 MG/ML IV BOLUS
INTRAVENOUS | Status: AC
  Filled 2018-07-17: qty 20

## 2018-07-17 MED ORDER — PROPOFOL 10 MG/ML IV BOLUS
INTRAVENOUS | Status: DC | PRN
  Administered 2018-07-17: 120 mg via INTRAVENOUS

## 2018-07-17 MED ORDER — ONDANSETRON HCL 4 MG/2ML IJ SOLN: 4.0000 mg | Freq: Once | INTRAMUSCULAR | Status: DC | PRN

## 2018-07-17 MED ORDER — MIDAZOLAM HCL 2 MG/2ML IJ SOLN
INTRAMUSCULAR | Status: AC
  Filled 2018-07-17: qty 2

## 2018-07-17 SURGICAL SUPPLY — 43 items
"PENCIL ELECTRO HAND CTR " (MISCELLANEOUS) ×1 IMPLANT
ADAPTER BETA CAP QUINTON DIALY (ADAPTER) IMPLANT
ADAPTER CATH DIALYSIS 18.75 (CATHETERS) ×1 IMPLANT
ADAPTER CATH DIALYSIS 18.75CM (CATHETERS)
CANISTER SUCT 1200ML W/VALVE (MISCELLANEOUS) ×3 IMPLANT
CATH DLYS SWAN NECK 62.5CM (CATHETERS) ×1 IMPLANT
CHLORAPREP W/TINT 26ML (MISCELLANEOUS) ×3 IMPLANT
COVER WAND RF STERILE (DRAPES) ×3 IMPLANT
DERMABOND ADVANCED (GAUZE/BANDAGES/DRESSINGS) ×2
DERMABOND ADVANCED .7 DNX12 (GAUZE/BANDAGES/DRESSINGS) ×1 IMPLANT
DRAPE LAPAROTOMY TRNSV 106X77 (MISCELLANEOUS) ×3 IMPLANT
ELECT CAUTERY BLADE 6.4 (BLADE) ×3 IMPLANT
ELECT REM PT RETURN 9FT ADLT (ELECTROSURGICAL) ×3
ELECTRODE REM PT RTRN 9FT ADLT (ELECTROSURGICAL) ×1 IMPLANT
GAUZE SPONGE 4X4 12PLY STRL (GAUZE/BANDAGES/DRESSINGS) ×1 IMPLANT
GLOVE BIO SURGEON STRL SZ7 (GLOVE) ×6 IMPLANT
GLOVE INDICATOR 7.5 STRL GRN (GLOVE) ×1 IMPLANT
GOWN STRL REUS W/ TWL LRG LVL3 (GOWN DISPOSABLE) ×1 IMPLANT
GOWN STRL REUS W/ TWL XL LVL3 (GOWN DISPOSABLE) ×2 IMPLANT
GOWN STRL REUS W/TWL LRG LVL3 (GOWN DISPOSABLE) ×2
GOWN STRL REUS W/TWL XL LVL3 (GOWN DISPOSABLE) ×2
IV NS 500ML (IV SOLUTION) ×2
IV NS 500ML BAXH (IV SOLUTION) ×1 IMPLANT
KIT TURNOVER KIT A (KITS) ×3 IMPLANT
LABEL OR SOLS (LABEL) ×3 IMPLANT
MINICAP W/POVIDONE IODINE SOL (MISCELLANEOUS) ×1 IMPLANT
NDL HYPO 25X1 1.5 SAFETY (NEEDLE) ×1 IMPLANT
NEEDLE HYPO 25X1 1.5 SAFETY (NEEDLE) ×3 IMPLANT
NS IRRIG 500ML POUR BTL (IV SOLUTION) ×3 IMPLANT
PACK BASIN MINOR ARMC (MISCELLANEOUS) ×3 IMPLANT
PACK LAP CHOLECYSTECTOMY (MISCELLANEOUS) ×1 IMPLANT
PENCIL ELECTRO HAND CTR (MISCELLANEOUS) IMPLANT
SET CYSTO W/LG BORE CLAMP LF (SET/KITS/TRAYS/PACK) ×1 IMPLANT
SET TRANSFER 6 W/TWIST CLAMP 5 (SET/KITS/TRAYS/PACK) ×1 IMPLANT
SET TUBE SMOKE EVAC HIGH FLOW (TUBING) ×3 IMPLANT
SPONGE DRAIN TRACH 4X4 STRL 2S (GAUZE/BANDAGES/DRESSINGS) ×1 IMPLANT
SPONGE VERSALON 4X4 4PLY (MISCELLANEOUS) ×1 IMPLANT
SUT MNCRL AB 4-0 PS2 18 (SUTURE) ×3 IMPLANT
SUT VIC AB 2-0 UR6 27 (SUTURE) ×3 IMPLANT
SUT VICRYL+ 3-0 36IN CT-1 (SUTURE) ×3 IMPLANT
SYR 10ML LL (SYRINGE) ×3 IMPLANT
TROCAR XCEL NON-BLD 11X100MML (ENDOMECHANICALS) ×1 IMPLANT
TROCAR XCEL NON-BLD 5MMX100MML (ENDOMECHANICALS) IMPLANT

## 2018-07-17 NOTE — Progress Notes (Addendum)
Patient stable at other than pain in back region, oral pain meds to be given.

## 2018-07-17 NOTE — Anesthesia Post-op Follow-up Note (Signed)
Anesthesia QCDR form completed.        

## 2018-07-17 NOTE — Anesthesia Postprocedure Evaluation (Signed)
Anesthesia Post Note  Patient: Jake Gomez  Procedure(s) Performed: REMOVAL OF A DIALYSIS CATHETER (N/A )  Patient location during evaluation: PACU Anesthesia Type: General Level of consciousness: awake and alert Pain management: pain level controlled Vital Signs Assessment: post-procedure vital signs reviewed and stable Respiratory status: spontaneous breathing and respiratory function stable Cardiovascular status: stable Anesthetic complications: no     Last Vitals:  Vitals:   07/17/18 1051 07/17/18 1114  BP: 133/63 129/60  Pulse: 88 87  Resp: 16 16  Temp:  36.7 C  SpO2: 98% 96%    Last Pain:  Vitals:   07/17/18 1114  TempSrc: Oral  PainSc: Asleep                 Nashon Erbes K

## 2018-07-17 NOTE — Progress Notes (Signed)
OT Cancellation Note  Patient Details Name: Jake Gomez MRN: [id number] DOB: [birth year]   Cancelled Treatment:    Reason Eval/Treat Not Completed: Patient at procedure or test/ unavailable. Pt out of room. Per chart review, pt scheduled for removal of PD catheter today. Will continue to follow and re-attempt OT evaluation at later date/time as pt is available and medically appropriate.   Jeni Salles, MPH, MS, OTR/L ascom [phone] 07/17/18, 9:35 AM

## 2018-07-17 NOTE — Anesthesia Procedure Notes (Signed)
Procedure Name: LMA Insertion Performed by: Clela Hagadorn, CRNA Pre-anesthesia Checklist: Patient identified, Patient being monitored, Timeout performed, Emergency Drugs available and Suction available Patient Re-evaluated:Patient Re-evaluated prior to induction Oxygen Delivery Method: Circle system utilized Preoxygenation: Pre-oxygenation with 100% oxygen Induction Type: IV induction Ventilation: Mask ventilation without difficulty LMA: LMA inserted LMA Size: 4.5 Tube type: Oral Number of attempts: 1 Placement Confirmation: positive ETCO2 and breath sounds checked- equal and bilateral Tube secured with: Tape Dental Injury: Teeth and Oropharynx as per pre-operative assessment        

## 2018-07-17 NOTE — Care Management Important Message (Signed)
Copy of signed Medicare IM left with patient in room. 

## 2018-07-17 NOTE — Progress Notes (Addendum)
[place] at [place] NAME: Jake Gomez    MR#:  [id number]  DATE OF BIRTH:  1971-03-07  SUBJECTIVE: Peritoneal dialysis catheter removed.   He is drowsy due to general anesthesia that was given.   REVIEW OF SYSTEMS:    Review of Systems  Constitutional: Negative for chills, fever and weight loss.  HENT: Negative for congestion, hearing loss and tinnitus.   Eyes: Negative for blurred vision and double vision.  Respiratory: Negative for cough, shortness of breath and wheezing.   Cardiovascular: Negative for chest pain, orthopnea and PND.  Gastrointestinal: Positive for abdominal pain. Negative for diarrhea, nausea and vomiting.  Genitourinary: Negative for dysuria and hematuria.  Neurological: Negative for dizziness, sensory change and focal weakness.  All other systems reviewed and are negative.   Nutrition: Renal/Carb modified Tolerating Diet: Yes Tolerating PT: Await Eval.   DRUG ALLERGIES:  No Known Allergies  VITALS:  Blood pressure 136/68, pulse 86, temperature 98.1 F (36.7 C), temperature source Oral, resp. rate 17, height 5\' 8"  (1.727 m), weight 116.2 kg, SpO2 93 %.  PHYSICAL EXAMINATION:   Physical Exam  GENERAL:  48 y.o.-year-old obese patient lying in bed appears uncomfortable because of abdominal pain. EYES: Pupils equal, round, reactive to light and accommodation. No scleral icterus. Extraocular muscles intact.  HEENT: Head atraumatic, normocephalic. Oropharynx and nasopharynx clear.  NECK:  Supple, no jugular venous distention. No thyroid enlargement, no tenderness.  LUNGS: Normal breath sounds bilaterally, no wheezing, rales, rhonchi. No use of accessory muscles of respiration.  CARDIOVASCULAR: S1, S2 normal. No murmurs, rubs, or gallops.  ABDOMEN: Soft, Tender in epigastric area, no rebound, rigidity,  nondistended. Bowel sounds present. No organomegaly or mass.  + PD cath in place.  EXTREMITIES: No cyanosis,  clubbing or edema b/l.    NEUROLOGIC: Cranial nerves II through XII are intact. No focal Motor or sensory deficits b/l.   PSYCHIATRIC: The patient is alert and oriented x 3.  SKIN: No obvious rash, lesion, or ulcer.    LABORATORY PANEL:   CBC Recent Labs  Lab 07/17/18 0459  WBC 12.6*  HGB 8.6*  HCT 27.3*  PLT 224   ------------------------------------------------------------------------------------------------------------------  Chemistries  Recent Labs  Lab 07/10/18 2142  07/17/18 0459  NA 132*   < > 131*  K 4.6   < > 4.6  CL 96*   < > 90*  CO2 25   < > 28  GLUCOSE 349*   < > 169*  BUN 56*   < > 61*  CREATININE 9.89*   < > 12.09*  CALCIUM 9.6   < > 10.0  AST 35  --   --   ALT 56*  --   --   ALKPHOS 206*  --   --   BILITOT 0.8  --   --    < > = values in this interval not displayed.   ------------------------------------------------------------------------------------------------------------------  Cardiac Enzymes No results for input(s): TROPONINI in the last 168 hours. ------------------------------------------------------------------------------------------------------------------  RADIOLOGY:  No results found.   ASSESSMENT AND PLAN:   48 year old male with past medical history of end-stage renal disease on peritoneal dialysis, hypertension, diabetes, hyperlipidemia, previous history of Pseudomonas peritonitis who presents to the hospital due to abdominal pain.  1.  Abdominal pain- due to recurrent peritonitis, peritoneal fluid this time also showing Pseudomonas.,  Patient had history of Pseudomonas infection from peritoneal fluid 2 times before.  PD catheter removal, continue antibiotics, change to [employer]  for Pseudomonas.    2.  End-stage renal disease on peritoneal dialysis-convert him to hemodialysis, peritoneal's catheter removed.  Nephrology is following.  Will permacath placement either late tomorrow/Wednesday,   3.  Essential hypertension- continue  Coreg, Avapro  4.  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   - BS stable.   5.  Depression-continue Zoloft.  6.  Hyperlipidemia-continue atorvastatin.  7.  Secondary hyperparathyroidism-continue PhosLo.  #7 abdominal pain secondary to peritonitis, catheter removal today, continue IV pain medicines,, WBC down to 12.6 today. Watch closely, ambulate as tolerated. all the records are reviewed and case discussed with Care Management/Social Worker. Management plans discussed with the patient, family and they are in agreement.  CODE STATUS: Full code  DVT Prophylaxis: Hep. SQ  TOTAL TIME TAKING CARE OF THIS PATIENT: 30 minutes.    more than 50% time spent in counseling, coordination of care     M.D on 07/17/2018 at 1:22 PM  Between 7am to 6pm - Pager - [phone]  After 6pm go to [email or web address] - Proofreader  [facility]  Office  [phone]  CC: Primary care physician; Juline Patch, MD

## 2018-07-17 NOTE — Transfer of Care (Signed)
Immediate Anesthesia Transfer of Care Note  Patient: Jake Gomez  Procedure(s) Performed: REMOVAL OF A DIALYSIS CATHETER (N/A )  Patient Location: PACU  Anesthesia Type:General  Level of Consciousness: sedated  Airway & Oxygen Therapy: Patient Spontanous Breathing and Patient connected to face mask oxygen  Post-op Assessment: Report given to RN and Post -op Vital signs reviewed and stable  Post vital signs: Reviewed  Last Vitals:  Vitals Value Taken Time  BP 122/79 07/17/2018 10:16 AM  Temp 36.1 C 07/17/2018 10:16 AM  Pulse 87 07/17/2018 10:16 AM  Resp 14 07/17/2018 10:16 AM  SpO2 100 % 07/17/2018 10:16 AM    Last Pain:  Vitals:   07/17/18 0846  TempSrc: Temporal  PainSc:       Patients Stated Pain Goal: 0 (51/10/21 1173)  Complications: No apparent anesthesia complications

## 2018-07-17 NOTE — OR Nursing (Signed)
Dr. Lucky Gomez removed a peritoneal dialysis catheter from [name] abdomen in the operating room today. A catheter was not documented as being placed under the lines and drains tab of his chart.

## 2018-07-17 NOTE — Anesthesia Preprocedure Evaluation (Signed)
Anesthesia Evaluation  Patient identified by MRN, date of birth, ID band Patient awake    Reviewed: Allergy & Precautions, NPO status , Patient's Chart, lab work & pertinent test results  History of Anesthesia Complications Negative for: history of anesthetic complications  Airway Mallampati: III       Dental   Pulmonary neg sleep apnea, neg COPD, former smoker,           Cardiovascular hypertension, Pt. on medications (-) Past MI and (-) CHF (-) dysrhythmias (-) Valvular Problems/Murmurs     Neuro/Psych    GI/Hepatic Neg liver ROS, neg GERD  ,  Endo/Other  diabetes, Type 2, Oral Hypoglycemic Agents  Renal/GU ESRF and DialysisRenal disease     Musculoskeletal   Abdominal   Peds  Hematology   Anesthesia Other Findings   Reproductive/Obstetrics                             Anesthesia Physical Anesthesia Plan  ASA: IV  Anesthesia Plan: General   Post-op Pain Management:    Induction: Intravenous  PONV Risk Score and Plan: 2 and Ondansetron and Midazolam  Airway Management Planned: LMA  Additional Equipment:   Intra-op Plan:   Post-operative Plan:   Informed Consent: I have reviewed the patients History and Physical, chart, labs and discussed the procedure including the risks, benefits and alternatives for the proposed anesthesia with the patient or authorized representative who has indicated his/her understanding and acceptance.       Plan Discussed with:   Anesthesia Plan Comments:         Anesthesia Quick Evaluation

## 2018-07-17 NOTE — Op Note (Signed)
[facility]   OPERATIVE NOTE  DATE: 07/17/2018  PRE-OPERATIVE DIAGNOSIS: ESRD, peritonitis with infected peritoneal dialysis catheter  POST-OPERATIVE DIAGNOSIS: same as above  PROCEDURE: 1.   Removal of peritoneal dialysis catheter  SURGEON: Leotis Pain, MD  ASSISTANT(S): none  ANESTHESIA: general  ESTIMATED BLOOD LOSS: 10 cc  FINDING(S): 1.  none  SPECIMEN(S):  PD catheter  INDICATIONS:   Patient is a 48 y.o.male with peritonitis and an infected peritoneal dialysis catheter.  The peritoneal catheter needs to be removed.  Risks and benefits are discussed.  DESCRIPTION: After obtaining full informed written consent, the patient was brought back to the operating room and placed supine upon the operating table.  The patient received IV antibiotics prior to induction.  After obtaining adequate anesthesia, the patient was prepped and draped in the standard fashion. The previous incision in the left lower quadrant were the deep cuff was located was reopened, and the cuff was dissected out at the fascia without difficulty.  The catheter was then removed from the peritoneal cavity and a 0 Vicryl pursestring suture was used to close this opening.  The superficial cuff was then dissected out and freed and the catheter with a separate left upper quadrant incision and was then transected and removed from the field.  The 2 incisions were necessary secondary to the presternal external opening in the left lower quadrant deep cuff placement.  The transverse periumbilical incision was then closed with a 3-0 Vicryl and a 4-0 Monocryl and dermabond was placed.  A dry dressing was placed at the previous catheter exit site.  The patient was then awakened from anesthesia and taken to the recovery room in stable condition.  COMPLICATIONS: None  CONDITION: Stable   Leotis Pain 07/17/2018 10:05 AM

## 2018-07-17 NOTE — Care Management (Addendum)
Barrier- infection PD site. Requiring hemodialysis as unable to use PD site. Elevated WBC.  If patient requires long term IV antibiotics will need to coordinate with dialysis coordinator so can be given with HD treatments.  Jake Gomez with [facility] is coordinating outpatient chair for hemodialysis.   VResults for Jake Gomez, Jake Gomez (MRN [id number]) as of 07/17/2018 14:45  Ref. Range 05/05/2018 15:44  Hepatitis B Surface Ag Latest Ref Range: Negative  Negative   patient was recently closed to [facility].  If able to return home, would like to use agency.  At present physical therapy is recommending skilled nursing facility

## 2018-07-17 NOTE — H&P (Signed)
[facility] History & Physical Update  The patient was interviewed and re-examined.  The patient's previous History and Physical has been reviewed and is unchanged.  Will remove his PD catheter.  Would recommend waiting 48 hours (if possible) to place a Permcath to reduce the risk of infection. We plan to proceed with the scheduled procedure of PD catheter removal.  Leotis Pain, MD  07/17/2018, 9:11 AM

## 2018-07-17 NOTE — Progress Notes (Signed)
OT Cancellation Note  Patient Details Name: Jake Gomez MRN: [id number] DOB: 12-16-1970   Cancelled Treatment:    Reason Eval/Treat Not Completed: Fatigue/lethargy limiting ability to participate. Upon 2nd attempt, pt sleeping. Nurse tech reporting delirium and pt not very alert. Will re-attempt OT evaluation at later date/time as pt is medically appropriate and able to participate.   Jeni Salles, MPH, MS, OTR/L ascom [phone] 07/17/18, 3:28 PM

## 2018-07-17 NOTE — Progress Notes (Signed)
[place], [place] 07/17/18  Subjective:  PD catheter removed. Appreciate vascular surgery assistance. Being treated for Pseudomonas peritonitis. Still complaining of abdominal pain.   Objective:  Vital signs in last 24 hours:  Temp:  [96.9 F (36.1 C)-98.3 F (36.8 C)] 98.1 F (36.7 C) (02/03 1200) Pulse Rate:  [81-94] 86 (02/03 1200) Resp:  [14-20] 17 (02/03 1200) BP: (106-155)/(57-79) 136/68 (02/03 1200) SpO2:  [88 %-100 %] 93 % (02/03 1200)  Weight change:  Filed Weights   07/11/18 0443 07/12/18 0500 07/15/18 0500  Weight: 114.7 kg 116.3 kg 116.2 kg    Intake/Output:    Intake/Output Summary (Last 24 hours) at 07/17/2018 1407 Last data filed at 07/17/2018 0935 Gross per 24 hour  Intake 404.98 ml  Output -  Net 404.98 ml     Physical Exam: General:  No acute distress, laying in the bed  HEENT  moist oral mucous membranes  Neck  supple  Pulm/lungs  normal breathing effort on room air, clear to auscultation  CVS/Heart  regular rhythm  Abdomen:   Soft, mild diffuse tenderness, some exit site tenderness  Extremities:  No edema  Neurologic:  Alert, oriented  Skin:  No acute rashes  Access:  PD catheter removed.        Basic Metabolic Panel:  Recent Labs  Lab 07/10/18 2142 07/15/18 1247 07/17/18 0459  NA 132* 130* 131*  K 4.6 4.0 4.6  CL 96* 91* 90*  CO2 25 27 28   GLUCOSE 349* 198* 169*  BUN 56* 55* 61*  CREATININE 9.89* 11.39* 12.09*  CALCIUM 9.6 9.2 10.0     CBC: Recent Labs  Lab 07/10/18 2142 07/12/18 0354 07/13/18 0457 07/15/18 1247 07/17/18 0459  WBC 12.7* 12.5* 8.6 14.7* 12.6*  NEUTROABS 11.2*  --   --  11.7*  --   HGB 11.2* 8.7* 8.4* 8.4* 8.6*  HCT 34.4* 27.7* 26.2* 26.3* 27.3*  MCV 97.7 99.3 100.0 99.6 100.4*  PLT 177 151 128* 152 224      Lab Results  Component Value Date   HEPBSAG Negative 05/05/2018    Results for TAITEN, BRAWN (MRN [id number]) as of 07/12/2018 10:22  Ref. Range 07/11/2018  21:36  WBC, Fluid Latest Units: cu mm 4,121  Lymphs, Fluid Latest Units: % 5  Eos, Fluid Latest Units: % 2  Appearance, Fluid Latest Ref Range: CLEAR  HAZY (A)  Other Cells, Fluid Latest Units: % 0  Neutrophil Count, Fluid Latest Units: % 92  Monocyte-Macrophage-Serous Fluid Latest Units: % 1    Microbiology:  Recent Results (from the past 240 hour(s))  Body fluid culture     Status: None   Collection Time: 07/11/18  9:35 PM  Result Value Ref Range Status   Specimen Description FLUID PERITONEAL  Final   Special Requests NONE  Final   Gram Stain   Final    WBC PRESENT, PREDOMINANTLY PMN GRAM NEGATIVE RODS CYTOSPIN SMEAR Performed at [facility], [address], [place], [place] [address]    Culture FEW PSEUDOMONAS AERUGINOSA  Final   Report Status 07/15/2018 FINAL  Final   Organism ID, Bacteria PSEUDOMONAS AERUGINOSA  Final      Susceptibility   Pseudomonas aeruginosa - MIC*    CEFTAZIDIME <=1 SENSITIVE Sensitive     CIPROFLOXACIN <=0.25 SENSITIVE Sensitive     GENTAMICIN <=1 SENSITIVE Sensitive     IMIPENEM 2 SENSITIVE Sensitive     PIP/TAZO 8 SENSITIVE Sensitive     CEFEPIME <=1 SENSITIVE  Sensitive     * FEW PSEUDOMONAS AERUGINOSA  Surgical PCR screen     Status: None   Collection Time: 07/17/18  8:24 AM  Result Value Ref Range Status   MRSA, PCR NEGATIVE NEGATIVE Final   Staphylococcus aureus NEGATIVE NEGATIVE Final    Comment: (NOTE) The Xpert SA Assay (FDA approved for NASAL specimens in patients 65 years of age and older), is one component of a comprehensive surveillance program. It is not intended to diagnose infection nor to guide or monitor treatment. Performed at [facility], [address] [place], [place] [address]     Coagulation Studies: Recent Labs    07/17/18 0459  LABPROT 13.8  INR 1.07    Urinalysis: No results for input(s): COLORURINE, LABSPEC, PHURINE, GLUCOSEU, HGBUR, BILIRUBINUR, KETONESUR, PROTEINUR, UROBILINOGEN,  NITRITE, LEUKOCYTESUR in the last 72 hours.  Invalid input(s): APPERANCEUR    Imaging: No results found.   Medications:   . sodium chloride 0 mL/hr at 07/15/18 1941  . cefTAZidime (FORTAZ)  IV Stopped (07/16/18 1855)  . dianeal solution for CAPD/CCPD with additives    . dialysis solution 2.5% low-MG/low-CA     . atorvastatin  80 mg Oral q morning - 10a  . calcium acetate  2,001 mg Oral TID WC  . carvedilol  6.25 mg Oral BID WC  . docusate sodium  100 mg Oral BID  . epoetin (EPOGEN/PROCRIT) injection  20,000 Units Intravenous Weekly  . fluticasone  2 spray Each Nare Daily  . folic acid  1 mg Oral Daily  . gentamicin cream  1 application Topical Daily  . heparin  5,000 Units Subcutaneous Q8H  . insulin aspart  0-5 Units Subcutaneous QHS  . insulin aspart  0-9 Units Subcutaneous TID WC  . irbesartan  300 mg Oral QHS  . lactulose  30 g Oral Daily  . mupirocin ointment  1 application Nasal BID  . polyethylene glycol  17 g Oral Daily  . sertraline  25 mg Oral Daily  . tobramycin variable dose per unstable renal function (pharmacist dosing)   Does not apply See admin instructions  . traZODone  50 mg Oral QHS  . cyanocobalamin  1,000 mcg Oral Daily   sodium chloride, acetaminophen **OR** acetaminophen, morphine injection, ondansetron **OR** ondansetron (ZOFRAN) IV, traMADol  Assessment/ Plan:  48 y.o.hispanic Gomez with end-stage renal disease, diabetes, hypertension, secondary hyperparathyroidism, hospitalization for Pseudomonas peritonitis in November 2019 at [facility],  Jake Gomez [facility]/111 kg/4 fills x 3000 cc, last fill 2500 (icodextrine), manual exchange 2500 cc   1.  ESRD, peritoneal dialysis 2.  Abdominal pain with cloudy fluid, acute pseudomonal peritonitis 3.  Anemia of chronic kidney disease 4.  Diabetes with CKD 5.  Secondary hyperparathyroidism  -PD catheter has been removed.  We will plan for permacath placement late tomorrow if possible.  We will  need to discuss this further with vascular surgery.  Does not appear volume overloaded at the moment.  Serum potassium acceptable at 4.6.  Further plan as patient progresses.    LOS: 6 Charley Miske 2/3/20202:07 PM  [place], [place]  Note: This note was prepared with Dragon dictation. Any transcription errors are unintentional

## 2018-07-18 ENCOUNTER — Encounter: Payer: Self-pay | Admitting: Vascular Surgery

## 2018-07-18 ENCOUNTER — Other Ambulatory Visit (INDEPENDENT_AMBULATORY_CARE_PROVIDER_SITE_OTHER): Payer: Self-pay | Admitting: Vascular Surgery

## 2018-07-18 LAB — HEPATITIS B SURFACE ANTIGEN: Hepatitis B Surface Ag: NEGATIVE

## 2018-07-18 LAB — GLUCOSE, CAPILLARY
Glucose-Capillary: 167 mg/dL — ABNORMAL HIGH (ref 70–99)
Glucose-Capillary: 176 mg/dL — ABNORMAL HIGH (ref 70–99)
Glucose-Capillary: 160 mg/dL — ABNORMAL HIGH (ref 70–99)
Glucose-Capillary: 169 mg/dL — ABNORMAL HIGH (ref 70–99)

## 2018-07-18 MED ORDER — BIOTENE DRY MOUTH MT LIQD: 15.0000 mL | OROMUCOSAL | Status: DC | PRN

## 2018-07-18 MED ORDER — SODIUM ZIRCONIUM CYCLOSILICATE 10 G PO PACK
10.0000 g | PACK | Freq: Two times a day (BID) | ORAL | Status: DC
  Administered 2018-07-18 – 2018-07-22 (×7): 10 g via ORAL
  Filled 2018-07-18 (×9): qty 1

## 2018-07-18 NOTE — Care Management (Addendum)
Infected peritoneal dialysis cath has been removed.  To have permcath placed 2.5.  Discussed the need to ambulate patient and not to just rely on physical therapy. Patient is sitting  "in the chair" but care team not able to state patient has sat for a dialysis treatment. Stressed he must be able to sit for his treatments. Physical therapy is now recommending home health. This CM gave [facility] a "heads up 2.4.2020.

## 2018-07-18 NOTE — Evaluation (Signed)
Occupational Therapy Evaluation Patient Details Name: Jake Gomez MRN: [id number] DOB: 08/22/1970 Today's Date: 07/18/2018    History of Present Illness Pt is a 48 year old male with past medical history of end-stage renal disease on peritoneal dialysis, hypertension, diabetes, hyperlipidemia, previous history of Pseudomonas peritonitis who presents to the hospital due to abdominal pain 07/10/18. Peritoneal fluid this time also showing Pseudomonas. Reports he lives at home with parents and has not been working for some time, but has been previously independent with all mobility   Clinical Impression   Pt seen for OT evaluation this date and partial co-tx with PT for pt's safety. Prior to hospital admission, pt was independent with ADL and mobility.  Currently pt demonstrates impairments in abdominal pain from surgery, impaired balance, and BLE weakness requiring MIN assist for LB ADL and CGA for functional mobility with RW. Pt instructed in log roll and sidelying<>sitting for bed mobility to minimize abdominal pain with pt able to return demo with MIN A from therapist for sidelying>sit 2/2 pain. Pt/family instructed in falls prevention, AE to improve LB ADL tasks, home/routines modifications for ADL to help improve indep and decrease abdominal pain. Vitals taken in supine, sitting, and standing (please see below for additional detail). Pt initially experiencing dizziness once sitting EOB from supine that resolved within 1 minute. Pt would benefit from skilled OT to address noted impairments and functional limitations (see below for any additional details) in order to maximize safety and independence while minimizing falls risk and caregiver burden.  Upon hospital discharge, recommend pt discharge home with family support. Will continue to work with pt in the hospital, but do not anticipate any skilled OT needs upon discharge.    Follow Up Recommendations  No OT follow up    Equipment Recommendations   3 in 1 bedside commode    Recommendations for Other Services       Precautions / Restrictions Precautions Precautions: Fall Restrictions Weight Bearing Restrictions: No      Mobility Bed Mobility Overal bed mobility: Needs Assistance Bed Mobility: Rolling;Sidelying to Sit Rolling: Modified independent (Device/Increase time) Sidelying to sit: Min assist;HOB elevated       General bed mobility comments: pt instructed in log roll to minimize abdominal pain during bed mobility, requires MIN A and HOB elevated to complete sidelying>sit EOB  Transfers Overall transfer level: Needs assistance Equipment used: Rolling walker (2 wheeled) Transfers: Sit to/from Stand Sit to Stand: Min assist         General transfer comment: pt instructed in hand/foot placement to improve independence and safety    Balance Overall balance assessment: Needs assistance Sitting-balance support: Feet unsupported;Single extremity supported Sitting balance-Leahy Scale: Good     Standing balance support: Bilateral upper extremity supported Standing balance-Leahy Scale: Good                             ADL either performed or assessed with clinical judgement   ADL Overall ADL's : Needs assistance/impaired                                       General ADL Comments: MIN A for LB ADL, CGA for functional transfer and mobility with RW     Vision Baseline Vision/History: No visual deficits Patient Visual Report: No change from baseline       Perception     Praxis  Pertinent Vitals/Pain Pain Assessment: 0-10 Pain Score: 7  Pain Location: abdomen  Pain Descriptors / Indicators: Discomfort;Grimacing;Guarding Pain Intervention(s): Limited activity within patient's tolerance;Monitored during session;Repositioned;Premedicated before session     Hand Dominance Right   Extremity/Trunk Assessment Upper Extremity Assessment Upper Extremity Assessment: Overall WFL  for tasks assessed   Lower Extremity Assessment Lower Extremity Assessment: Generalized weakness   Cervical / Trunk Assessment Cervical / Trunk Assessment: Normal   Communication Communication Communication: No difficulties   Cognition Arousal/Alertness: Awake/alert Behavior During Therapy: WFL for tasks assessed/performed Overall Cognitive Status: Within Functional Limits for tasks assessed                                     General Comments  supine O2 88-90% on RA, BP 159/76; sit EOB w/ mild dizziness that resolved w/in 1 min, O2 84-92% on RA, BP 133/64; standing 0 min BP 95/57, O2 86-91% on RA; standing ~21min BP 142/68, O2 sats on RA >95%    Exercises Other Exercises Other Exercises: Pt/family instructed in falls prevention, AE to improve LB ADL tasks, home/routines modifications for ADL to help improve indep and decrease abdominal pain   Shoulder Instructions      Home Living Family/patient expects to be discharged to:: Private residence Living Arrangements: Parent Available Help at Discharge: Family;Available 24 hours/day Type of Home: House Home Access: Stairs to enter [employer] of Steps: 2 Entrance Stairs-Rails: Left Home Layout: One level     Bathroom Shower/Tub: Teacher, early years/pre: Standard Bathroom Accessibility: No   Home Equipment: Cane - single point;Walker - 2 wheels          Prior Functioning/Environment Level of Independence: Needs assistance  Gait / Transfers Assistance Needed: Pt reports independent mobility prior, with several noted falls (does admit fall while donning pants) ADL's / Homemaking Assistance Needed: Ind with ADLs. Was driving.             OT Problem List: Decreased strength;Decreased knowledge of use of DME or AE;Pain;Impaired balance (sitting and/or standing);Decreased activity tolerance      OT Treatment/Interventions: Self-care/ADL training;Balance training;Therapeutic  exercise;Therapeutic activities;DME and/or AE instruction;Energy conservation;Patient/family education    OT Goals(Current goals can be found in the care plan section) Acute Rehab OT Goals Patient Stated Goal: Ambulate and transfer ind to return to baseline OT Goal Formulation: With patient/family Time For Goal Achievement: 08/01/18 Potential to Achieve Goals: Good ADL Goals Pt Will Perform Lower Body Dressing: with supervision;sit to/from stand Pt Will Transfer to Toilet: with supervision;ambulating(BSC over toilet, LRAD for amb)  OT Frequency: Min 1X/week   Barriers to D/C:            Co-evaluation PT/OT/SLP Co-Evaluation/Treatment: Yes Reason for Co-Treatment: For patient/therapist safety;To address functional/ADL transfers PT goals addressed during session: Mobility/safety with mobility OT goals addressed during session: ADL's and self-care      AM-PAC OT "6 Clicks" Daily Activity     Outcome Measure Help from another person eating meals?: None Help from another person taking care of personal grooming?: None Help from another person toileting, which includes using toliet, bedpan, or urinal?: A Little Help from another person bathing (including washing, rinsing, drying)?: A Little Help from another person to put on and taking off regular upper body clothing?: None Help from another person to put on and taking off regular lower body clothing?: A Little 6 Click Score: 21   End of  Session Equipment Utilized During Treatment: Gait belt;Rolling walker  Activity Tolerance: Patient tolerated treatment well Patient left: in bed;with call bell/phone within reach;with family/visitor present;Other (comment)(Pt EOB w/ PT for additional therapy)  OT Visit Diagnosis: Other abnormalities of gait and mobility (R26.89);Repeated falls (R29.6);Muscle weakness (generalized) (M62.81);Pain Pain - part of body: (abdomen)                Time: 2548-3234 OT Time Calculation (min): 42  min Charges:  OT General Charges $OT Visit: 1 Visit OT Evaluation $OT Eval Low Complexity: 1 Low OT Treatments $Self Care/Home Management : 23-37 mins  Jeni Salles, MPH, MS, OTR/L ascom [phone] 07/18/18, 1:03 PM

## 2018-07-18 NOTE — Progress Notes (Signed)
Physical Therapy Treatment Patient Details Name: Jake Gomez MRN: [id number] DOB: 1970/12/31 Today's Date: 07/18/2018    History of Present Illness Pt is a 48 year old male with past medical history of end-stage renal disease on peritoneal dialysis, hypertension, diabetes, hyperlipidemia, previous history of Pseudomonas peritonitis who presents to the hospital due to abdominal pain 07/10/18. Peritoneal fluid this time also showing Pseudomonas. Reports he lives at home with parents and has not been working for some time, but has been previously independent with all mobility    PT Comments    Patient agreeable to PT, received with OT and family present.  Patient having fluctuation with O2 sats throughout session, however no reports of SOB. Encouraged patient to take deep breaths as able, but due to pain it is difficult. Patient requires min guard and cues for transfers, is able to ambulate 40 feet this day with rw and min guard assist. Patient requested to turn around due to LE weakness. Patient then performed seated and supine exercises. Patient will benefit from continued PT to address his weakness and limited mobility.  Patient should be able to return home at discharge with assistance of family and HHPT.       Follow Up Recommendations  Home health PT     Equipment Recommendations  Rolling walker with 5" wheels    Recommendations for Other Services       Precautions / Restrictions Precautions Precautions: Fall Restrictions Weight Bearing Restrictions: No    Mobility  Bed Mobility Overal bed mobility: Modified Independent             General bed mobility comments: increased time, rails and 2 attempts needed to get from sitting to supine due to pain in abdomen.  Transfers Overall transfer level: Needs assistance Equipment used: Rolling walker (2 wheeled) Transfers: Sit to/from Stand Sit to Stand: Min assist         General transfer comment: Cues for hand placement,  cues to push with legs and lean forward  Ambulation/Gait Ambulation/Gait assistance: Min guard Gait Distance (Feet): 40 Feet Assistive device: Rolling walker (2 wheeled) Gait Pattern/deviations: Shuffle;Step-to pattern Gait velocity: decreased   General Gait Details: monitoring O2 sats thoughout gait, no reported dizziness   Stairs             Wheelchair Mobility    Modified Rankin (Stroke Patients Only)       Balance Overall balance assessment: Needs assistance Sitting-balance support: Feet unsupported;Single extremity supported Sitting balance-Leahy Scale: Good     Standing balance support: Bilateral upper extremity supported Standing balance-Leahy Scale: Good                              Cognition Arousal/Alertness: Awake/alert Behavior During Therapy: WFL for tasks assessed/performed Overall Cognitive Status: Within Functional Limits for tasks assessed                                        Exercises Other Exercises Other Exercises: seated LAQ, marching x 10 bilaterally. Supine heel slides, hip abd/add x 10 reps bilaterally. Unable to complete hip abd/add on left due to pain.     General Comments        Pertinent Vitals/Pain Pain Assessment: 0-10 Pain Score: 7  Pain Location: abdomen  Pain Descriptors / Indicators: Discomfort;Grimacing;Guarding Pain Intervention(s): Limited activity within patient's tolerance;Monitored during session  Home Living                      Prior Function            PT Goals (current goals can now be found in the care plan section) Acute Rehab PT Goals Patient Stated Goal: Ambulate and transfer ind to return to baseline PT Goal Formulation: With patient Time For Goal Achievement: 07/30/18 Progress towards PT goals: Progressing toward goals    Frequency    Min 2X/week      PT Plan Discharge plan needs to be updated    Co-evaluation PT/OT/SLP Co-Evaluation/Treatment:  Yes Reason for Co-Treatment: For patient/therapist safety PT goals addressed during session: Mobility/safety with mobility OT goals addressed during session: ADL's and self-care      AM-PAC PT "6 Clicks" Mobility   Outcome Measure  Help needed turning from your back to your side while in a flat bed without using bedrails?: A Lot Help needed moving from lying on your back to sitting on the side of a flat bed without using bedrails?: A Lot Help needed moving to and from a bed to a chair (including a wheelchair)?: A Little Help needed standing up from a chair using your arms (e.g., wheelchair or bedside chair)?: A Little Help needed to walk in hospital room?: A Little Help needed climbing 3-5 steps with a railing? : A Little 6 Click Score: 16    End of Session Equipment Utilized During Treatment: Gait belt Activity Tolerance: Patient limited by fatigue;Patient tolerated treatment well Patient left: in bed;with call bell/phone within reach;with family/visitor present Nurse Communication: Mobility status PT Visit Diagnosis: Muscle weakness (generalized) (M62.81);Difficulty in walking, not elsewhere classified (R26.2);Unsteadiness on feet (R26.81);Pain;History of falling (Z91.81) Pain - part of body: (abdomen)     Time: 1130-1200 PT Time Calculation (min) (ACUTE ONLY): 30 min  Charges:  $Gait Training: 8-22 mins $Therapeutic Exercise: 8-22 mins                     Robin Petrakis, PT, GCS 07/18/18,12:19 PM

## 2018-07-18 NOTE — Progress Notes (Signed)
[place] at [place] NAME: Jake Gomez    MR#:  [id number]  DATE OF BIRTH:  July 21, 1970  Less abdominal pain today, family at bedside.  aFebrile.   REVIEW OF SYSTEMS:    Review of Systems  Constitutional: Negative for chills, fever and weight loss.  HENT: Negative for congestion, hearing loss and tinnitus.   Eyes: Negative for blurred vision and double vision.  Respiratory: Negative for cough, shortness of breath and wheezing.   Cardiovascular: Negative for chest pain, orthopnea and PND.  Gastrointestinal: Positive for abdominal pain. Negative for diarrhea, nausea and vomiting.  Genitourinary: Negative for dysuria and hematuria.  Neurological: Negative for dizziness, sensory change and focal weakness.  All other systems reviewed and are negative.   Nutrition: Renal/Carb modified Tolerating Diet: Yes Tolerating PT: Await Eval.   DRUG ALLERGIES:  No Known Allergies  VITALS:  Blood pressure (!) 159/76, pulse 71, temperature 98.3 F (36.8 C), temperature source Oral, resp. rate 18, height 5\' 8"  (1.727 m), weight 115.9 kg, SpO2 90 %.  PHYSICAL EXAMINATION:   Physical Exam  GENERAL:  48 y.o.-year-old obese patient lying in bed appears uncomfortable because of abdominal pain. EYES: Pupils equal, round, reactive to light and accommodation. No scleral icterus. Extraocular muscles intact.  HEENT: Head atraumatic, normocephalic. Oropharynx and nasopharynx clear.  NECK:  Supple, no jugular venous distention. No thyroid enlargement, no tenderness.  LUNGS: Normal breath sounds bilaterally, no wheezing, rales, rhonchi. No use of accessory muscles of respiration.  CARDIOVASCULAR: S1, S2 normal. No murmurs, rubs, or gallops.  ABDOMEN: Slight tenderness in epigastric area but overall much better.Marland Kitchen  EXTREMITIES: No cyanosis, clubbing or edema b/l.    NEUROLOGIC: Cranial nerves II through XII are intact. No focal Motor or sensory deficits b/l.     PSYCHIATRIC: The patient is alert and oriented x 3.  SKIN: No obvious rash, lesion, or ulcer.    LABORATORY PANEL:   CBC Recent Labs  Lab 07/17/18 0459  WBC 12.6*  HGB 8.6*  HCT 27.3*  PLT 224   ------------------------------------------------------------------------------------------------------------------  Chemistries  Recent Labs  Lab 07/17/18 0459  NA 131*  K 4.6  CL 90*  CO2 28  GLUCOSE 169*  BUN 61*  CREATININE 12.09*  CALCIUM 10.0   ------------------------------------------------------------------------------------------------------------------  Cardiac Enzymes No results for input(s): TROPONINI in the last 168 hours. ------------------------------------------------------------------------------------------------------------------  RADIOLOGY:  No results found.   ASSESSMENT AND PLAN:   48 year old male with past medical history of end-stage renal disease on peritoneal dialysis, hypertension, diabetes, hyperlipidemia, previous history of Pseudomonas peritonitis who presents to the hospital due to abdominal pain.  1.  Abdominal pain- due to recurrent peritonitis, peritoneal fluid this time also showing Pseudomonas.,  Patient had history of Pseudomonas infection from peritoneal fluid 2 times before.  PD catheter removed by vascular yesterday, possible permacath placement tomorrow.  Needs outpatient dialysis chair.   3.  Essential hypertension- continue Coreg, Avapro  4.  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   - BS stable.   5.  Depression-continue Zoloft.  6.  Hyperlipidemia-continue atorvastatin.  7.  Secondary hyperparathyroidism-continue PhosLo.  #7 abdominal pain secondary to peritonitis, peritoneal dialysis catheter removed, continue Fortaz, advised the patient to stay away from IV pain medicines.  Use tramadol for pain and use morphine less often today in preparation for possible  discharge Thursday. All the records are reviewed and case discussed with Care Management/Social Worker. Management plans discussed with  the patient, family and they are in agreement.  CODE STATUS: Full code  DVT Prophylaxis: Hep. SQ  TOTAL TIME TAKING CARE OF THIS PATIENT: 30 minutes.    more than 50% time spent in counseling, coordination of care     M.D on 07/18/2018 at 12:21 PM  Between 7am to 6pm - Pager - [phone]  After 6pm go to [email or web address] - Proofreader  [facility]  Office  [phone]  CC: Primary care physician; Juline Patch, MD

## 2018-07-18 NOTE — Progress Notes (Signed)
[facility] Daily Progress Note   Subjective: 1 Day Post-Op: Removal of peritoneal dialysis catheter  Patient without complaint. No issues overnight.   Objective: Vitals:   07/18/18 0430 07/18/18 0900 07/18/18 1118 07/18/18 1119  BP:  138/69 (!) 159/76   Pulse: 66 70 70 71  Resp:  20 18   Temp:  (!) 97.5 F (36.4 C) 98.3 F (36.8 C)   TempSrc:  Oral Oral   SpO2: 95% 92% (!) 88% 90%  Weight:      Height:        Intake/Output Summary (Last 24 hours) at 07/18/2018 1525 Last data filed at 07/18/2018 1300 Gross per 24 hour  Intake 233.82 ml  Output 0 ml  Net 233.82 ml   Physical Exam: A&Ox3, NAD CV: RRR Pulmonary: CTA Bilaterally Abdomen: Soft, Nontender, Nondistended, (+) Bowel Sounds  Incisions: Clean and dry. No ecchymosis. Dermabond intact.   Catheter Tunnel: Dressing clean, dry and intact. Vascular: Warm, Non-tender, Non-distended   Laboratory: CBC    Component Value Date/Time   WBC 12.6 (H) 07/17/2018 0459   HGB 8.6 (L) 07/17/2018 0459   HGB 11.5 (L) 04/30/2014 1244   HCT 27.3 (L) 07/17/2018 0459   HCT 33.9 (L) 04/30/2014 1244   PLT 224 07/17/2018 0459   PLT 238 04/30/2014 1244   BMET    Component Value Date/Time   NA 131 (L) 07/17/2018 0459   NA 142 04/30/2014 1244   K 4.6 07/17/2018 0459   K 4.3 04/30/2014 1244   CL 90 (L) 07/17/2018 0459   CL 109 (H) 04/30/2014 1244   CO2 28 07/17/2018 0459   CO2 25 04/30/2014 1244   GLUCOSE 169 (H) 07/17/2018 0459   GLUCOSE 94 04/30/2014 1244   BUN 61 (H) 07/17/2018 0459   BUN 61 (H) 04/30/2014 1244   CREATININE 12.09 (H) 07/17/2018 0459   CREATININE 4.40 (H) 04/30/2014 1244   CALCIUM 10.0 07/17/2018 0459   CALCIUM 7.8 (L) 04/30/2014 1244   GFRNONAA 4 (L) 07/17/2018 0459   GFRNONAA 16 (L) 04/30/2014 1244   GFRAA 5 (L) 07/17/2018 0459   GFRAA 19 (L) 04/30/2014 1244   Assessment/Planning: The patient is a 48 year old male with a PMHx of ESRD with bacterial peritonitis now s/p PD cath  removal 1) Patient NOS unremarkable 2) Will plan on permcath insertion tomorrow to allow for dialysis 3) Will pre-op  Discussed with Dr. Ellis Parents Aneira Cavitt PA-C 07/18/2018 3:25 PM

## 2018-07-18 NOTE — Progress Notes (Signed)
[place], [place] 07/18/18  Subjective:  Patient seen at bedside. States abdominal pain has improved. Awaiting outpatient hemodialysis center placement.   Objective:  Vital signs in last 24 hours:  Temp:  [97.5 F (36.4 C)-98.3 F (36.8 C)] 98.3 F (36.8 C) (02/04 1118) Pulse Rate:  [66-77] 71 (02/04 1119) Resp:  [18-20] 18 (02/04 1118) BP: (133-159)/(69-76) 159/76 (02/04 1118) SpO2:  [88 %-98 %] 90 % (02/04 1119) Weight:  [115.9 kg] 115.9 kg (02/04 0426)  Weight change:  Filed Weights   07/12/18 0500 07/15/18 0500 07/18/18 0426  Weight: 116.3 kg 116.2 kg 115.9 kg    Intake/Output:    Intake/Output Summary (Last 24 hours) at 07/18/2018 1550 Last data filed at 07/18/2018 1300 Gross per 24 hour  Intake 233.82 ml  Output 0 ml  Net 233.82 ml     Physical Exam: General:  No acute distress, laying in the bed  HEENT  moist oral mucous membranes  Neck  supple  Pulm/lungs  normal breathing effort on room air, clear to auscultation  CVS/Heart  regular rhythm  Abdomen:   Soft, mild diffuse tenderness, some exit site tenderness  Extremities:  No edema  Neurologic:  Alert, oriented  Skin:  No acute rashes  Access:  PD catheter removed.        Basic Metabolic Panel:  Recent Labs  Lab 07/15/18 1247 07/17/18 0459  NA 130* 131*  K 4.0 4.6  CL 91* 90*  CO2 27 28  GLUCOSE 198* 169*  BUN 55* 61*  CREATININE 11.39* 12.09*  CALCIUM 9.2 10.0     CBC: Recent Labs  Lab 07/12/18 0354 07/13/18 0457 07/15/18 1247 07/17/18 0459  WBC 12.5* 8.6 14.7* 12.6*  NEUTROABS  --   --  11.7*  --   HGB 8.7* 8.4* 8.4* 8.6*  HCT 27.7* 26.2* 26.3* 27.3*  MCV 99.3 100.0 99.6 100.4*  PLT 151 128* 152 224      Lab Results  Component Value Date   HEPBSAG Negative 07/17/2018    Results for Jake, Gomez (MRN [id number]) as of 07/12/2018 10:22  Ref. Range 07/11/2018 21:36  WBC, Fluid Latest Units: cu mm 4,121  Lymphs, Fluid Latest Units: % 5  Eos,  Fluid Latest Units: % 2  Appearance, Fluid Latest Ref Range: CLEAR  HAZY (A)  Other Cells, Fluid Latest Units: % 0  Neutrophil Count, Fluid Latest Units: % 92  Monocyte-Macrophage-Serous Fluid Latest Units: % 1    Microbiology:  Recent Results (from the past 240 hour(s))  Body fluid culture     Status: None   Collection Time: 07/11/18  9:35 PM  Result Value Ref Range Status   Specimen Description FLUID PERITONEAL  Final   Special Requests NONE  Final   Gram Stain   Final    WBC PRESENT, PREDOMINANTLY PMN GRAM NEGATIVE RODS CYTOSPIN SMEAR Performed at [facility], [address] [place], [place] [address]    Culture FEW PSEUDOMONAS AERUGINOSA  Final   Report Status 07/15/2018 FINAL  Final   Organism ID, Bacteria PSEUDOMONAS AERUGINOSA  Final      Susceptibility   Pseudomonas aeruginosa - MIC*    CEFTAZIDIME <=1 SENSITIVE Sensitive     CIPROFLOXACIN <=0.25 SENSITIVE Sensitive     GENTAMICIN <=1 SENSITIVE Sensitive     IMIPENEM 2 SENSITIVE Sensitive     PIP/TAZO 8 SENSITIVE Sensitive     CEFEPIME <=1 SENSITIVE Sensitive     * FEW PSEUDOMONAS AERUGINOSA  Surgical PCR  screen     Status: None   Collection Time: 07/17/18  8:24 AM  Result Value Ref Range Status   MRSA, PCR NEGATIVE NEGATIVE Final   Staphylococcus aureus NEGATIVE NEGATIVE Final    Comment: (NOTE) The Xpert SA Assay (FDA approved for NASAL specimens in patients 66 years of age and older), is one component of a comprehensive surveillance program. It is not intended to diagnose infection nor to guide or monitor treatment. Performed at [facility], [address] [place], [place] [address]   Aerobic Culture (superficial specimen)     Status: None (Preliminary result)   Collection Time: 07/17/18  9:53 AM  Result Value Ref Range Status   Specimen Description   Final    URINE, CATHETERIZED Performed at [facility], [address] [place], [place] [address]    Special Requests    Final    NONE Performed at [facility], [place], [place] [address]    Gram Stain NO WBC SEEN NO ORGANISMS SEEN   Final   Culture   Final    CULTURE REINCUBATED FOR BETTER GROWTH Performed at [facility], [place] [address] [place], [place] [address]    Report Status PENDING  Incomplete  Cath Tip Culture     Status: Abnormal (Preliminary result)   Collection Time: 07/17/18  9:53 AM  Result Value Ref Range Status   Specimen Description CATH TIP PERITONEAL  Final   Special Requests   Final    NONE Performed at [facility], [place] [address], [place], [place] [address]    Culture >=100,000 COLONIES/mL PSEUDOMONAS AERUGINOSA (A)  Final   Report Status PENDING  Incomplete    Coagulation Studies: Recent Labs    07/17/18 0459  LABPROT 13.8  INR 1.07    Urinalysis: No results for input(s): COLORURINE, LABSPEC, PHURINE, GLUCOSEU, HGBUR, BILIRUBINUR, KETONESUR, PROTEINUR, UROBILINOGEN, NITRITE, LEUKOCYTESUR in the last 72 hours.  Invalid input(s): APPERANCEUR    Imaging: No results found.   Medications:   . sodium chloride 0 mL/hr at 07/15/18 1941  . cefTAZidime (FORTAZ)  IV Stopped (07/17/18 1801)  . dialysis solution 2.5% low-MG/low-CA     . atorvastatin  80 mg Oral q morning - 10a  . calcium acetate  2,001 mg Oral TID WC  . carvedilol  6.25 mg Oral BID WC  . docusate sodium  100 mg Oral BID  . epoetin (EPOGEN/PROCRIT) injection  20,000 Units Intravenous Weekly  . fluticasone  2 spray Each Nare Daily  . folic acid  1 mg Oral Daily  . gentamicin cream  1 application Topical Daily  . heparin  5,000 Units Subcutaneous Q8H  . insulin aspart  0-5 Units Subcutaneous QHS  . insulin aspart  0-9 Units Subcutaneous TID WC  . irbesartan  300 mg Oral QHS  . lactulose  30 g Oral Daily  . polyethylene glycol  17 g Oral Daily  . sertraline  25 mg Oral Daily  . traZODone  50 mg Oral QHS  . cyanocobalamin  1,000 mcg Oral Daily   sodium  chloride, acetaminophen **OR** acetaminophen, antiseptic oral rinse, morphine injection, ondansetron **OR** ondansetron (ZOFRAN) IV, traMADol  Assessment/ Plan:  48 y.o.hispanic male with end-stage renal disease, diabetes, hypertension, secondary hyperparathyroidism, hospitalization for Pseudomonas peritonitis in November 2019 at [facility],  [name]/111 kg/4 fills x 3000 cc, last fill 2500 (icodextrine), manual exchange 2500 cc   1.  ESRD, peritoneal dialysis 2.  Abdominal pain with cloudy fluid, acute pseudomonal peritonitis 3.  Anemia of chronic kidney disease 4.  Diabetes with CKD 5.  Secondary hyperparathyroidism  - patient doing well status post perineal dialysis catheter removal.  We are planning for PermCath placement tomorrow.  Outpatient hemodialysis center placement still pending.  Continue antibiotics as per infectious disease recommendations.  Further plan as patient progresses.  We will plan for dialysis tomorrow.    LOS: 7 Jake Gomez 2/4/20203:50 PM  [place], [place]  Note: This note was prepared with Dragon dictation. Any transcription errors are unintentional

## 2018-07-18 NOTE — Progress Notes (Signed)
Vascular and nephrology service messaged: I was reading the note writen by Dr. Holley Raring of the possibility of Permcath being place on the patient later today. If this is the case can you let me  know in case I need to make the patient NPO.

## 2018-07-19 ENCOUNTER — Encounter
Admission: EM | Disposition: A | Discharge status: Home Health | Payer: Self-pay | Source: Home / Self Care | Attending: Internal Medicine

## 2018-07-19 ENCOUNTER — Encounter: Payer: Self-pay | Admitting: Vascular Surgery

## 2018-07-19 DIAGNOSIS — K659 Peritonitis, unspecified: Secondary | ICD-10-CM

## 2018-07-19 DIAGNOSIS — Z992 Dependence on renal dialysis: Secondary | ICD-10-CM

## 2018-07-19 DIAGNOSIS — N186 End stage renal disease: Secondary | ICD-10-CM

## 2018-07-19 HISTORY — PX: DIALYSIS/PERMA CATHETER INSERTION: CATH118288

## 2018-07-19 LAB — CATH TIP CULTURE: Culture: >=100000 — AB

## 2018-07-19 LAB — GLUCOSE, CAPILLARY
Glucose-Capillary: 123 mg/dL — ABNORMAL HIGH (ref 70–99)
Glucose-Capillary: 98 mg/dL (ref 70–99)
Glucose-Capillary: 126 mg/dL — ABNORMAL HIGH (ref 70–99)
Glucose-Capillary: 125 mg/dL — ABNORMAL HIGH (ref 70–99)

## 2018-07-19 LAB — CBC
HCT: 21.2 % — ABNORMAL LOW (ref 39.0–52.0)
HEMOGLOBIN: 6.7 g/dL — AB (ref 13.0–17.0)
MCH: 31.8 pg (ref 26.0–34.0)
MCHC: 31.6 g/dL (ref 30.0–36.0)
MCV: 100.5 fL — ABNORMAL HIGH (ref 80.0–100.0)
Platelets: 296 10*3/uL (ref 150–400)
RBC: 2.11 MIL/uL — ABNORMAL LOW (ref 4.22–5.81)
RDW: 16.2 % — ABNORMAL HIGH (ref 11.5–15.5)
WBC: 12.1 10*3/uL — ABNORMAL HIGH (ref 4.0–10.5)
nRBC: 0.2 % (ref 0.0–0.2)

## 2018-07-19 LAB — BASIC METABOLIC PANEL
Anion gap: 16 — ABNORMAL HIGH (ref 5–15)
BUN: 84 mg/dL — ABNORMAL HIGH (ref 6–20)
CO2: 25 mmol/L (ref 22–32)
Calcium: 9.7 mg/dL (ref 8.9–10.3)
Chloride: 90 mmol/L — ABNORMAL LOW (ref 98–111)
Creatinine, Ser: 14.13 mg/dL — ABNORMAL HIGH (ref 0.61–1.24)
GFR calc Af Amer: 4 mL/min — ABNORMAL LOW (ref >60)
GFR calc non Af Amer: 4 mL/min — ABNORMAL LOW (ref >60)
Glucose, Bld: 142 mg/dL — ABNORMAL HIGH (ref 70–99)
Potassium: 4.3 mmol/L (ref 3.5–5.1)
Sodium: 131 mmol/L — ABNORMAL LOW (ref 135–145)

## 2018-07-19 LAB — HEMOGLOBIN AND HEMATOCRIT, BLOOD
HCT: 25.7 % — ABNORMAL LOW (ref 39.0–52.0)
HEMOGLOBIN: 8 g/dL — AB (ref 13.0–17.0)

## 2018-07-19 LAB — PREPARE RBC (CROSSMATCH)

## 2018-07-19 LAB — AEROBIC CULTURE W GRAM STAIN (SUPERFICIAL SPECIMEN): Gram Stain: NONE SEEN

## 2018-07-19 LAB — ABO/RH: ABO/RH(D): O POS

## 2018-07-19 LAB — PHOSPHORUS: PHOSPHORUS: 10.9 mg/dL — AB (ref 2.5–4.6)

## 2018-07-19 LAB — MAGNESIUM: Magnesium: 2.4 mg/dL (ref 1.7–2.4)

## 2018-07-19 SURGERY — DIALYSIS/PERMA CATHETER INSERTION
Anesthesia: Choice

## 2018-07-19 MED ORDER — SODIUM CHLORIDE 0.9 % IV SOLN
INTRAVENOUS | Status: DC
  Administered 2018-07-19: 10:00:00 via INTRAVENOUS

## 2018-07-19 MED ORDER — METHYLPREDNISOLONE SODIUM SUCC 125 MG IJ SOLR: 125.0000 mg | INTRAMUSCULAR | Status: DC | PRN

## 2018-07-19 MED ORDER — LIDOCAINE HCL (PF) 1 % IJ SOLN
5.0000 mL | INTRAMUSCULAR | Status: DC | PRN
  Filled 2018-07-19: qty 5

## 2018-07-19 MED ORDER — ONDANSETRON HCL 4 MG/2ML IJ SOLN: 4.0000 mg | Freq: Four times a day (QID) | INTRAMUSCULAR | Status: DC | PRN

## 2018-07-19 MED ORDER — SODIUM CHLORIDE 0.9 % IV SOLN: 100.0000 mL | INTRAVENOUS | Status: DC | PRN

## 2018-07-19 MED ORDER — HYDROMORPHONE HCL 1 MG/ML IJ SOLN: 1.0000 mg | Freq: Once | INTRAMUSCULAR | Status: DC | PRN

## 2018-07-19 MED ORDER — FENTANYL CITRATE (PF) 100 MCG/2ML IJ SOLN
INTRAMUSCULAR | Status: AC
  Filled 2018-07-19: qty 2

## 2018-07-19 MED ORDER — LIDOCAINE-EPINEPHRINE (PF) 1 %-1:200000 IJ SOLN
INTRAMUSCULAR | Status: AC
  Filled 2018-07-19: qty 30

## 2018-07-19 MED ORDER — LIDOCAINE-PRILOCAINE 2.5-2.5 % EX CREA
1.0000 "application " | TOPICAL_CREAM | CUTANEOUS | Status: DC | PRN
  Filled 2018-07-19: qty 5

## 2018-07-19 MED ORDER — ACETAMINOPHEN 325 MG PO TABS
ORAL_TABLET | ORAL | Status: AC
  Filled 2018-07-19: qty 2

## 2018-07-19 MED ORDER — SODIUM CHLORIDE 0.9% IV SOLUTION: Freq: Once | INTRAVENOUS | Status: DC

## 2018-07-19 MED ORDER — CHLORHEXIDINE GLUCONATE CLOTH 2 % EX PADS
6.0000 | MEDICATED_PAD | Freq: Every day | CUTANEOUS | Status: DC
  Administered 2018-07-19 – 2018-07-20 (×2): 6 via TOPICAL

## 2018-07-19 MED ORDER — HEPARIN SODIUM (PORCINE) 1000 UNIT/ML DIALYSIS: 1000.0000 [IU] | INTRAMUSCULAR | Status: DC | PRN

## 2018-07-19 MED ORDER — MIDAZOLAM HCL 2 MG/2ML IJ SOLN
INTRAMUSCULAR | Status: AC
  Filled 2018-07-19: qty 2

## 2018-07-19 MED ORDER — ALTEPLASE 2 MG IJ SOLR: 2.0000 mg | Freq: Once | INTRAMUSCULAR | Status: DC | PRN

## 2018-07-19 MED ORDER — FENTANYL CITRATE (PF) 100 MCG/2ML IJ SOLN
INTRAMUSCULAR | Status: DC | PRN
  Administered 2018-07-19: 50 ug via INTRAVENOUS

## 2018-07-19 MED ORDER — CEFAZOLIN SODIUM-DEXTROSE 1-4 GM/50ML-% IV SOLN
INTRAVENOUS | Status: AC
  Filled 2018-07-19: qty 50

## 2018-07-19 MED ORDER — HEPARIN (PORCINE) IN NACL 1000-0.9 UT/500ML-% IV SOLN
INTRAVENOUS | Status: AC
  Filled 2018-07-19: qty 500

## 2018-07-19 MED ORDER — CEFAZOLIN SODIUM-DEXTROSE 1-4 GM/50ML-% IV SOLN: 1.0000 g | Freq: Once | INTRAVENOUS | Status: DC

## 2018-07-19 MED ORDER — MIDAZOLAM HCL 2 MG/ML PO SYRP: 8.0000 mg | ORAL_SOLUTION | Freq: Once | ORAL | Status: DC | PRN

## 2018-07-19 MED ORDER — PENTAFLUOROPROP-TETRAFLUOROETH EX AERO
1.0000 "application " | INHALATION_SPRAY | CUTANEOUS | Status: DC | PRN
  Filled 2018-07-19: qty 30

## 2018-07-19 MED ORDER — DIPHENHYDRAMINE HCL 50 MG/ML IJ SOLN: 50.0000 mg | Freq: Once | INTRAMUSCULAR | Status: DC

## 2018-07-19 MED ORDER — LIDOCAINE-EPINEPHRINE (PF) 1 %-1:200000 IJ SOLN
INTRAMUSCULAR | Status: DC | PRN
  Administered 2018-07-19: 20 mL

## 2018-07-19 MED ORDER — FAMOTIDINE 20 MG PO TABS: 40.0000 mg | ORAL_TABLET | ORAL | Status: DC | PRN

## 2018-07-19 MED ORDER — MIDAZOLAM HCL 2 MG/2ML IJ SOLN
INTRAMUSCULAR | Status: DC | PRN
  Administered 2018-07-19: 2 mg via INTRAVENOUS

## 2018-07-19 SURGICAL SUPPLY — 7 items
CATH PALINDROME RT-P 15FX23CM (CATHETERS) ×3 IMPLANT
COVER PROBE U/S 5X48 (MISCELLANEOUS) ×3 IMPLANT
DERMABOND ADVANCED (GAUZE/BANDAGES/DRESSINGS) ×2
DERMABOND ADVANCED .7 DNX12 (GAUZE/BANDAGES/DRESSINGS) ×1 IMPLANT
PACK ANGIOGRAPHY (CUSTOM PROCEDURE TRAY) ×3 IMPLANT
SUT MNCRL AB 4-0 PS2 18 (SUTURE) ×3 IMPLANT
SUT PROLENE 0 CT 1 30 (SUTURE) ×3 IMPLANT

## 2018-07-19 NOTE — Progress Notes (Signed)
Normal Saline  bolus 200 mL given b/p improved   07/19/18 1315  Vital Signs  Pulse Rate 68  Resp 14  BP (!) 91/48  Oxygen Therapy  SpO2 90 %

## 2018-07-19 NOTE — Progress Notes (Signed)
MD Notified, pt had drop in B/P, UF turned off, cuff adjusted and rechecked.    07/19/18 1230  Vital Signs  Pulse Rate 71  Resp 15  BP (!) 101/51  Oxygen Therapy  SpO2 91 %  During Hemodialysis Assessment  Blood Flow Rate (mL/min) 250 mL/min  Arterial Pressure (mmHg) -60 mmHg  Venous Pressure (mmHg) 30 mmHg  Transmembrane Pressure (mmHg) 50 mmHg  Ultrafiltration Rate (mL/min) 0 mL/min  Dialysate Flow Rate (mL/min) 500 ml/min  Conductivity: Machine  13.9  HD Safety Checks Performed Yes  Intra-Hemodialysis Comments See progress note

## 2018-07-19 NOTE — Progress Notes (Signed)
Pt B/P low, pt is asymptomatic, states he feels fine. MD aware    07/19/18 1245  Vital Signs  Pulse Rate 72  Resp 11  BP (!) 86/48  Oxygen Therapy  SpO2 (!) 89 %

## 2018-07-19 NOTE — Progress Notes (Signed)
HD Tx completed tolerated well, MD changed UF to 0, due to pt's low b/p and NPO status pre [facility]    07/19/18 1515  Vital Signs  Pulse Rate 69  Resp (!) 9  BP 103/61  Oxygen Therapy  SpO2 93 %  During Hemodialysis Assessment  Blood Flow Rate (mL/min) 250 mL/min  Arterial Pressure (mmHg) -60 mmHg  Venous Pressure (mmHg) 30 mmHg  Transmembrane Pressure (mmHg) 50 mmHg  Ultrafiltration Rate (mL/min) 0 mL/min  Dialysate Flow Rate (mL/min) 500 ml/min  Conductivity: Machine  13.9  HD Safety Checks Performed Yes  Dialysis Fluid Bolus Normal Saline  Bolus Amount (mL) 250 mL  Intra-Hemodialysis Comments Tx completed;Tolerated well

## 2018-07-19 NOTE — Progress Notes (Signed)
Hemoglobin rechecked. Most current hemoglobin level at 0730 was 8.0

## 2018-07-19 NOTE — Progress Notes (Signed)
POST HD [facility]    07/19/18 1530  Hand-Off documentation  Report given to (Full Name) Alveria Apley, RN   Report received from (Full Name) Beatris Ship, RN   Vital Signs  Temp 98.1 F (36.7 C)  Pulse Rate 68  Pulse Rate Source Monitor  Resp 15  BP 105/63  BP Location Left Arm  BP Method Automatic  Patient Position (if appropriate) Sitting  Oxygen Therapy  SpO2 95 %  O2 Device Room Air  Pulse Oximetry Type Continuous  Pain Assessment  Pain Scale 0-10  Pain Score 0  Dialysis Weight  Type of Weight Post-Dialysis (unable to weigh pt in recliner )  Post-Hemodialysis Assessment  Rinseback Volume (mL) 250 mL  KECN 41.9 V  Dialyzer Clearance Lightly streaked  Duration of HD Treatment -hour(s) 3 hour(s)  Hemodialysis Intake (mL) 500 mL  UF Total -Machine (mL) 0 mL  Net UF (mL) -500 mL  Hemodialysis Catheter Right Internal jugular  Placement Date: 07/19/18   Placed prior to admission: No  Orientation: Right  Access Location: Internal jugular  Site Condition No complications  Blue Lumen Status Heparin locked  Red Lumen Status Heparin locked  Post treatment catheter status Capped and Clamped

## 2018-07-19 NOTE — Progress Notes (Signed)
[place] at [place] NAME: Jake Gomez    MR#:  [id number]  DATE OF BIRTH:  30-Dec-1970  \For permacath today, after that for dialysis.  Abdominal pain is slightly better than before.   REVIEW OF SYSTEMS:    Review of Systems  Constitutional: Negative for chills, fever and weight loss.  HENT: Negative for congestion, hearing loss and tinnitus.   Eyes: Negative for blurred vision and double vision.  Respiratory: Negative for cough, shortness of breath and wheezing.   Cardiovascular: Negative for chest pain, orthopnea and PND.  Gastrointestinal: Positive for abdominal pain. Negative for diarrhea, nausea and vomiting.  Genitourinary: Negative for dysuria and hematuria.  Neurological: Negative for dizziness, sensory change and focal weakness.  All other systems reviewed and are negative.   Nutrition: Renal/Carb modified Tolerating Diet: Yes Tolerating PT: Await Eval.   DRUG ALLERGIES:  No Known Allergies  VITALS:  Blood pressure (!) 93/54, pulse 67, temperature 97.6 F (36.4 C), temperature source Oral, resp. rate 16, height 5\' 8"  (1.727 m), weight 115.9 kg, SpO2 91 %.  PHYSICAL EXAMINATION:   Physical Exam  GENERAL:  48 y.o.-year-old obese patient lying in bed appears uncomfortable because of abdominal pain. EYES: Pupils equal, round, reactive to light and accommodation. No scleral icterus. Extraocular muscles intact.  HEENT: Head atraumatic, normocephalic. Oropharynx and nasopharynx clear.  NECK:  Supple, no jugular venous distention. No thyroid enlargement, no tenderness.  LUNGS: Normal breath sounds bilaterally, no wheezing, rales, rhonchi. No use of accessory muscles of respiration.  CARDIOVASCULAR: S1, S2 normal. No murmurs, rubs, or gallops.  ABDOMEN: Slight tenderness in epigastric area but overall much better.Marland Kitchen  EXTREMITIES: No cyanosis, clubbing or edema b/l.    NEUROLOGIC: Cranial nerves II through XII are intact. No  focal Motor or sensory deficits b/l.   PSYCHIATRIC: The patient is alert and oriented x 3.  SKIN: No obvious rash, lesion, or ulcer.    LABORATORY PANEL:   CBC Recent Labs  Lab 07/19/18 0459 07/19/18 0730  WBC 12.1*  --   HGB 6.7* 8.0*  HCT 21.2* 25.7*  PLT 296  --    ------------------------------------------------------------------------------------------------------------------  Chemistries  Recent Labs  Lab 07/19/18 0459  NA 131*  K 4.3  CL 90*  CO2 25  GLUCOSE 142*  BUN 84*  CREATININE 14.13*  CALCIUM 9.7  MG 2.4   ------------------------------------------------------------------------------------------------------------------  Cardiac Enzymes No results for input(s): TROPONINI in the last 168 hours. ------------------------------------------------------------------------------------------------------------------  RADIOLOGY:  No results found.   ASSESSMENT AND PLAN:   48 year old male with past medical history of end-stage renal disease on peritoneal dialysis, hypertension, diabetes, hyperlipidemia, previous history of Pseudomonas peritonitis who presents to the hospital due to abdominal pain.  1.  Abdominal pain- due to recurrent peritonitis, peritoneal fluid this time also showing Pseudomonas.,  Patient had history of Pseudomonas infection from peritoneal fluid 2 times before.  PD catheter removed by vascular ,  permacath placement today, hemodialysis after that, nephrology is trying to arrange outpatient dialysis chair for him.  3.  Essential hypertension- continue Coreg, Avapro  4.  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   - BS stable.   5.  Depression-continue Zoloft.  6.  Hyperlipidemia-continue atorvastatin.  7.  Secondary hyperparathyroidism-continue PhosLo.  #7 abdominal pain secondary to peritonitis, status post peritoneal catheter removed, use tramadol, discontinue morphine. 8.   Anemia of chronic disease, ;  All the records are reviewed and case  discussed with Care Management/Social Worker. Management plans discussed with the patient, family and they are in agreement.  CODE STATUS: Full code  DVT Prophylaxis: Hep. SQ  TOTAL TIME TAKING CARE OF THIS PATIENT: 30 minutes.    more than 50% time spent in counseling, coordination of care     M.D on 07/19/2018 at 2:21 PM  Between 7am to 6pm - Pager - [phone]  After 6pm go to [email or web address] - Proofreader  [facility]  Office  [phone]  CC: Primary care physician; Juline Patch, MD

## 2018-07-19 NOTE — Progress Notes (Signed)
Pre HD Assessment    07/19/18 1210  Neurological  Level of Consciousness Alert  Respiratory  Respiratory Pattern Regular;Unlabored  Chest Assessment Chest expansion symmetrical  Bilateral Breath Sounds Clear;Diminished  Cough None  Cardiac  Pulse Regular  Heart Sounds S1, S2  ECG Monitor Yes  Vascular  R Radial Pulse +2  L Radial Pulse +2  Generalized Edema +1  Psychosocial  Psychosocial (WDL) WDL

## 2018-07-19 NOTE — Progress Notes (Signed)
Pre HD Tx   07/19/18 1219  Hand-Off documentation  Report given to (Full Name) Beatris Ship, RN   Report received from (Full Name) Alveria Apley, RN   Vital Signs  Temp 97.6 F (36.4 C)  Temp Source Oral  Pulse Rate 98  Pulse Rate Source Monitor  Resp 12  BP 126/62  BP Location Left Arm  BP Method Automatic  Patient Position (if appropriate) Sitting  Oxygen Therapy  SpO2 100 %  O2 Device Room Air  Pulse Oximetry Type Continuous  Pain Assessment  Pain Scale 0-10  Pain Score 0  Dialysis Weight  Weight  (unable to weigh pt in recliner )  Type of Weight Pre-Dialysis  Time-Out for Hemodialysis  What Procedure? HD  Pt Identifiers(min of two) First/Last Name;MRN/Account#  Correct Site? Yes  Correct Side? Yes  Correct Procedure? Yes  Consents Verified? Yes  Rad Studies Available? N/A  Safety Precautions Reviewed? Yes  Engineer, civil (consulting) Number [place] Number 2  UF/Alarm Test Passed  Conductivity: Meter 14  Conductivity: Machine  13.9  pH 7.4  Reverse Osmosis Main  Normal Saline Lot Number [id number]  Dialyzer Lot Number [id number]  Disposable Set Lot Number [id number]  Machine Temperature 98.6 F (37 C)  Musician and Audible Yes  Blood Lines Intact and Secured Yes  Pre Treatment Patient Checks  Vascular access used during treatment Catheter  Patient is receiving dialysis in a chair Yes  Hepatitis B Surface Antigen Results Negative  Date Hepatitis B Surface Antigen Drawn 03/18/17 (03/18/2017)  Hepatitis B Surface Antibody 18  Date Hepatitis B Surface Antibody Drawn 12/22/17  Hemodialysis Consent Verified Yes  Hemodialysis Standing Orders Initiated Yes  ECG (Telemetry) Monitor On Yes  Prime Ordered Normal Saline  Length of  DialysisTreatment -hour(s) 3 Hour(s)  Dialysis Treatment Comments Na 140  Dialyzer Elisio 17H NR  Dialysate 2K, 2.5 Ca  Dialysis Anticoagulant None  Dialysate Flow Ordered 500  Blood Flow Rate Ordered 250 mL/min  Ultrafiltration  Goal 1.5 Liters  Pre Treatment Labs Phosphorus (PTH)  Education / Care Plan  Dialysis Education Provided Yes  Documented Education in Care Plan Yes  Hemodialysis Catheter Right Internal jugular  Placement Date: 07/19/18   Placed prior to admission: No  Orientation: Right  Access Location: Internal jugular  Site Condition No complications  Blue Lumen Status Blood return noted  Red Lumen Status Blood return noted  Purple Lumen Status N/A  Dressing Type Gauze/Drain sponge;Biopatch  Dressing Change Due 07/20/18

## 2018-07-19 NOTE — Progress Notes (Signed)
MD messaged: Hello Dr. Holley Raring, the patient has been taken down for a permcath placement in case you wanted to take him for HD dialysis after the permcath is placed.

## 2018-07-19 NOTE — Progress Notes (Signed)
OT Cancellation Note  Patient Details Name: Jake Gomez MRN: [id number] DOB: 09-18-1970   Cancelled Treatment:    Reason Eval/Treat Not Completed: Patient at procedure or test/ unavailable. Pt out for dialysis. Will re-attempt OT tx at later date/time as pt is available and medically appropriate.   Jeni Salles, MPH, MS, OTR/L ascom [phone] 07/19/18, 1:11 PM

## 2018-07-19 NOTE — Op Note (Signed)
OPERATIVE NOTE    PRE-OPERATIVE DIAGNOSIS: 1. ESRD 2. Peritonitis requiring removal of PD catheter  POST-OPERATIVE DIAGNOSIS: same as above  PROCEDURE: 1. Ultrasound guidance for vascular access to the right internal jugular vein 2. Fluoroscopic guidance for placement of catheter 3. Placement of a 23 cm tip to cuff tunneled hemodialysis catheter via the right internal jugular vein  SURGEON: Leotis Pain, MD  ANESTHESIA:  Local with Moderate conscious sedation for approximately 15 minutes using 2 mg of Versed and 50 mcg of Fentanyl  ESTIMATED BLOOD LOSS: 3 cc  FLUORO TIME: less than one minute  CONTRAST: none  FINDING(S): 1.  Patent right internal jugular vein  SPECIMEN(S):  None  INDICATIONS:   Jake Gomez is a 48 y.o.male who presents with peritonitis and ESRD.  His PD catheter was removed.  The patient needs long term dialysis access for their ESRD, and a Permcath is necessary.  Risks and benefits are discussed and informed consent is obtained.    DESCRIPTION: After obtaining full informed written consent, the patient was brought back to the vascular suited. The patient's right neck and chest were sterilely prepped and draped in a sterile surgical field was created. Moderate conscious sedation was administered during a face to face encounter with the patient throughout the procedure with my supervision of the RN administering medicines and monitoring the patient's vital signs, pulse oximetry, telemetry and mental status throughout from the start of the procedure until the patient was taken to the recovery room.  The right internal jugular vein was visualized with ultrasound and found to be patent. It was then accessed under direct ultrasound guidance and a permanent image was recorded. A wire was placed. After skin nick and dilatation, the peel-away sheath was placed over the wire. I then turned my attention to an area under the clavicle. Approximately 1-2 fingerbreadths below the  clavicle a small counterincision was created and tunneled from the subclavicular incision to the access site. Using fluoroscopic guidance, a 23 centimeter tip to cuff tunneled hemodialysis catheter was selected, and tunneled from the subclavicular incision to the access site. It was then placed through the peel-away sheath and the peel-away sheath was removed. Using fluoroscopic guidance the catheter tips were parked in the right atrium. The appropriate distal connectors were placed. It withdrew blood well and flushed easily with heparinized saline and a concentrated heparin solution was then placed. It was secured to the chest wall with 2 Prolene sutures. The access incision was closed single 4-0 Monocryl. A 4-0 Monocryl pursestring suture was placed around the exit site. Sterile dressings were placed. The patient tolerated the procedure well and was taken to the recovery room in stable condition.  COMPLICATIONS: None  CONDITION: Stable  Leotis Pain, MD 07/19/2018 11:11 AM   This note was created with Dragon Medical transcription system. Any errors in dictation are purely unintentional.

## 2018-07-19 NOTE — Progress Notes (Signed)
HD Tx started w/o issue    07/19/18 1220  Vital Signs  Pulse Rate (!) 102  Pulse Rate Source Monitor  BP 126/62  BP Location Left Arm  BP Method Automatic  Patient Position (if appropriate) Sitting  Oxygen Therapy  SpO2 100 %  O2 Device Room Air  Pulse Oximetry Type Continuous  During Hemodialysis Assessment  Blood Flow Rate (mL/min) 250 mL/min  Arterial Pressure (mmHg) -60 mmHg  Venous Pressure (mmHg) 30 mmHg  Transmembrane Pressure (mmHg) 50 mmHg  Ultrafiltration Rate (mL/min) 660 mL/min  Dialysate Flow Rate (mL/min) 500 ml/min  Conductivity: Machine  13.9  HD Safety Checks Performed Yes  Dialysis Fluid Bolus Normal Saline  Bolus Amount (mL) 250 mL  Intra-Hemodialysis Comments Tx initiated

## 2018-07-19 NOTE — Progress Notes (Signed)
MD notified of hemoglobin 6.7

## 2018-07-19 NOTE — Progress Notes (Signed)
Post HD Assessment   07/19/18 1530  Neurological  Level of Consciousness Alert  Respiratory  Respiratory Pattern Regular;Unlabored  Chest Assessment Chest expansion symmetrical  Bilateral Breath Sounds Clear;Diminished  Cough None  Cardiac  Pulse Regular  Heart Sounds S1, S2  ECG Monitor Yes  Vascular  R Radial Pulse +2  L Radial Pulse +2  Generalized Edema +1  Psychosocial  Psychosocial (WDL) WDL

## 2018-07-19 NOTE — Progress Notes (Signed)
Physical Therapy Treatment Patient Details Name: Jake Gomez MRN: [id number] DOB: 17-Jul-1970 Today's Date: 07/19/2018    History of Present Illness Pt is a 48 year old male with past medical history of end-stage renal disease on peritoneal dialysis, hypertension, diabetes, hyperlipidemia, previous history of Pseudomonas peritonitis who presents to the hospital due to abdominal pain 07/10/18. Peritoneal fluid this time also showing Pseudomonas. Reports he lives at home with parents and has not been working for some time, but has been previously independent with all mobility    PT Comments    Pt able to ambulate x 1 around small nursing pod this am with walker and min guard.  Overall states his legs feel stronger and less fatigued with mobility but remains limited by fatigue.  Returned to room as pt awaiting transport team for [employer].   Stated he feels comfortable with discharge home but would like a 3 in 1 commode for discharge.  Reports he has a walker.   Follow Up Recommendations  Home health PT     Equipment Recommendations  3in1 (PT)    Recommendations for Other Services       Precautions / Restrictions Precautions Precautions: Fall Restrictions Weight Bearing Restrictions: No    Mobility  Bed Mobility Overal bed mobility: Needs Assistance Bed Mobility: Supine to Sit   Sidelying to sit: Min assist;HOB elevated          Transfers Overall transfer level: Needs assistance Equipment used: Rolling walker (2 wheeled) Transfers: Sit to/from Stand Sit to Stand: Min assist         General transfer comment: pt instructed in hand/foot placement to improve independence and safety  Ambulation/Gait Ambulation/Gait assistance: Min guard Gait Distance (Feet): 100 Feet Assistive device: Rolling walker (2 wheeled) Gait Pattern/deviations: Step-through pattern;Decreased step length - right;Decreased step length - left Gait velocity: decreased       Stairs              Wheelchair Mobility    Modified Rankin (Stroke Patients Only)       Balance Overall balance assessment: Needs assistance Sitting-balance support: Feet unsupported;Single extremity supported Sitting balance-Leahy Scale: Good     Standing balance support: Bilateral upper extremity supported Standing balance-Leahy Scale: Good                              Cognition                                              Exercises      General Comments        Pertinent Vitals/Pain      Home Living                      Prior Function            PT Goals (current goals can now be found in the care plan section) Progress towards PT goals: Progressing toward goals    Frequency    Min 2X/week      PT Plan      Co-evaluation              AM-PAC PT "6 Clicks" Mobility   Outcome Measure  Help needed turning from your back to your side while in a flat bed without using bedrails?: A Little Help needed  moving from lying on your back to sitting on the side of a flat bed without using bedrails?: A Little Help needed moving to and from a bed to a chair (including a wheelchair)?: A Little Help needed standing up from a chair using your arms (e.g., wheelchair or bedside chair)?: A Little Help needed to walk in hospital room?: A Little Help needed climbing 3-5 steps with a railing? : A Little 6 Click Score: 18    End of Session Equipment Utilized During Treatment: Gait belt Activity Tolerance: Patient tolerated treatment well;Patient limited by fatigue Patient left: in bed;with nursing/sitter in room Nurse Communication: Mobility status       Time: 2904-7533 PT Time Calculation (min) (ACUTE ONLY): 12 min  Charges:  $Gait Training: 8-22 mins                     Chesley Noon, PTA 07/19/18, 10:41 AM

## 2018-07-19 NOTE — Progress Notes (Signed)
[facility], [place] 07/19/18  Subjective:  Right internal jugular PermCath has been placed. Patient due for dialysis today.  Objective:  Vital signs in last 24 hours:  Temp:  [97.1 F (36.2 C)-97.6 F (36.4 C)] 97.6 F (36.4 C) (02/05 1219) Pulse Rate:  [63-102] 67 (02/05 1330) Resp:  [10-18] 16 (02/05 1330) BP: (86-164)/(48-79) 93/54 (02/05 1330) SpO2:  [88 %-100 %] 91 % (02/05 1330) Weight:  [115.9 kg] 115.9 kg (02/05 1018)  Weight change:  Filed Weights   07/15/18 0500 07/18/18 0426 07/19/18 1018  Weight: 116.2 kg 115.9 kg 115.9 kg    Intake/Output:    Intake/Output Summary (Last 24 hours) at 07/19/2018 1359 Last data filed at 07/19/2018 0605 Gross per 24 hour  Intake 104.43 ml  Output -  Net 104.43 ml     Physical Exam: General:  No acute distress, laying in the bed  HEENT  moist oral mucous membranes  Neck  supple  Pulm/lungs  normal breathing effort on room air, clear to auscultation  CVS/Heart  regular rhythm  Abdomen:   Soft, mild diffuse tenderness, some exit site tenderness  Extremities:  No edema  Neurologic:  Alert, oriented  Skin:  No acute rashes  Access:  right internal jugular PermCath in place       Basic Metabolic Panel:  Recent Labs  Lab 07/15/18 1247 07/17/18 0459 07/19/18 0459 07/19/18 1305  NA 130* 131* 131*  --   K 4.0 4.6 4.3  --   CL 91* 90* 90*  --   CO2 27 28 25   --   GLUCOSE 198* 169* 142*  --   BUN 55* 61* 84*  --   CREATININE 11.39* 12.09* 14.13*  --   CALCIUM 9.2 10.0 9.7  --   MG  --   --  2.4  --   PHOS  --   --   --  10.9*     CBC: Recent Labs  Lab 07/13/18 0457 07/15/18 1247 07/17/18 0459 07/19/18 0459 07/19/18 0730  WBC 8.6 14.7* 12.6* 12.1*  --   NEUTROABS  --  11.7*  --   --   --   HGB 8.4* 8.4* 8.6* 6.7* 8.0*  HCT 26.2* 26.3* 27.3* 21.2* 25.7*  MCV 100.0 99.6 100.4* 100.5*  --   PLT 128* 152 224 296  --       Lab Results  Component Value Date   HEPBSAG Negative  07/17/2018    Results for Jake Gomez, Jake Gomez (MRN [id number]) as of 07/12/2018 10:22  Ref. Range 07/11/2018 21:36  WBC, Fluid Latest Units: cu mm 4,121  Lymphs, Fluid Latest Units: % 5  Eos, Fluid Latest Units: % 2  Appearance, Fluid Latest Ref Range: CLEAR  HAZY (A)  Other Cells, Fluid Latest Units: % 0  Neutrophil Count, Fluid Latest Units: % 92  Monocyte-Macrophage-Serous Fluid Latest Units: % 1    Microbiology:  Recent Results (from the past 240 hour(s))  Body fluid culture     Status: None   Collection Time: 07/11/18  9:35 PM  Result Value Ref Range Status   Specimen Description FLUID PERITONEAL  Final   Special Requests NONE  Final   Gram Stain   Final    WBC PRESENT, PREDOMINANTLY PMN GRAM NEGATIVE RODS CYTOSPIN SMEAR Performed at [facility], [address] [place], [place] [address]    Culture FEW PSEUDOMONAS AERUGINOSA  Final   Report Status 07/15/2018 FINAL  Final   Organism ID, Bacteria  PSEUDOMONAS AERUGINOSA  Final      Susceptibility   Pseudomonas aeruginosa - MIC*    CEFTAZIDIME <=1 SENSITIVE Sensitive     CIPROFLOXACIN <=0.25 SENSITIVE Sensitive     GENTAMICIN <=1 SENSITIVE Sensitive     IMIPENEM 2 SENSITIVE Sensitive     PIP/TAZO 8 SENSITIVE Sensitive     CEFEPIME <=1 SENSITIVE Sensitive     * FEW PSEUDOMONAS AERUGINOSA  Surgical PCR screen     Status: None   Collection Time: 07/17/18  8:24 AM  Result Value Ref Range Status   MRSA, PCR NEGATIVE NEGATIVE Final   Staphylococcus aureus NEGATIVE NEGATIVE Final    Comment: (NOTE) The Xpert SA Assay (FDA approved for NASAL specimens in patients 61 years of age and older), is one component of a comprehensive surveillance program. It is not intended to diagnose infection nor to guide or monitor treatment. Performed at [facility], [address], [place], [place] [address]   Anaerobic culture     Status: None (Preliminary result)   Collection Time: 07/17/18  9:53 AM  Result Value Ref Range  Status   Specimen Description   Final    URINE, CATHETERIZED Performed at [facility], [address], [place], [place] [address]    Special Requests   Final    NONE Performed at [facility], [address] [place], [place] [address]    Culture   Final    NO ANAEROBES ISOLATED; CULTURE IN PROGRESS FOR 5 DAYS   Report Status PENDING  Incomplete  Aerobic Culture (superficial specimen)     Status: None   Collection Time: 07/17/18  9:53 AM  Result Value Ref Range Status   Specimen Description   Final    URINE, CATHETERIZED Performed at [facility], [address], [place], [place] [address]    Special Requests   Final    NONE Performed at [facility], [address] [place], [place] [address]    Gram Stain   Final    NO WBC SEEN NO ORGANISMS SEEN Performed at [facility], [place] [address], [place], [place] [address]    Culture FEW PSEUDOMONAS AERUGINOSA  Final   Report Status 07/19/2018 FINAL  Final   Organism ID, Bacteria PSEUDOMONAS AERUGINOSA  Final      Susceptibility   Pseudomonas aeruginosa - MIC*    CEFTAZIDIME <=1 SENSITIVE Sensitive     CIPROFLOXACIN <=0.25 SENSITIVE Sensitive     GENTAMICIN <=1 SENSITIVE Sensitive     IMIPENEM 2 SENSITIVE Sensitive     PIP/TAZO 8 SENSITIVE Sensitive     CEFEPIME <=1 SENSITIVE Sensitive     * FEW PSEUDOMONAS AERUGINOSA  Cath Tip Culture     Status: Abnormal   Collection Time: 07/17/18  9:53 AM  Result Value Ref Range Status   Specimen Description CATH TIP PERITONEAL  Final   Special Requests   Final    NONE Performed at [facility], [place] [address], [place], [place] [address]    Culture >=100,000 COLONIES/mL PSEUDOMONAS AERUGINOSA (A)  Final   Report Status 07/19/2018 FINAL  Final   Organism ID, Bacteria PSEUDOMONAS AERUGINOSA (A)  Final      Susceptibility   Pseudomonas aeruginosa - MIC*    CEFTAZIDIME 2 SENSITIVE Sensitive     CIPROFLOXACIN <=0.25 SENSITIVE  Sensitive     GENTAMICIN <=1 SENSITIVE Sensitive     IMIPENEM 2 SENSITIVE Sensitive     PIP/TAZO 8 SENSITIVE Sensitive  CEFEPIME <=1 SENSITIVE Sensitive     * >=100,000 COLONIES/mL PSEUDOMONAS AERUGINOSA    Coagulation Studies: Recent Labs    07/17/18 0459  LABPROT 13.8  INR 1.07    Urinalysis: No results for input(s): COLORURINE, LABSPEC, PHURINE, GLUCOSEU, HGBUR, BILIRUBINUR, KETONESUR, PROTEINUR, UROBILINOGEN, NITRITE, LEUKOCYTESUR in the last 72 hours.  Invalid input(s): APPERANCEUR    Imaging: No results found.   Medications:   . sodium chloride Stopped (07/18/18 2022)  . sodium chloride    . sodium chloride    . ceFAZolin    . cefTAZidime (FORTAZ)  IV Stopped (07/18/18 1930)  . dialysis solution 2.5% low-MG/low-CA     . sodium chloride   Intravenous Once  . acetaminophen      . atorvastatin  80 mg Oral q morning - 10a  . calcium acetate  2,001 mg Oral TID WC  . carvedilol  6.25 mg Oral BID WC  . Chlorhexidine Gluconate Cloth  6 each Topical Q0600  . docusate sodium  100 mg Oral BID  . epoetin (EPOGEN/PROCRIT) injection  20,000 Units Intravenous Weekly  . fluticasone  2 spray Each Nare Daily  . folic acid  1 mg Oral Daily  . gentamicin cream  1 application Topical Daily  . heparin  5,000 Units Subcutaneous Q8H  . insulin aspart  0-5 Units Subcutaneous QHS  . insulin aspart  0-9 Units Subcutaneous TID WC  . irbesartan  300 mg Oral QHS  . lactulose  30 g Oral Daily  . polyethylene glycol  17 g Oral Daily  . sertraline  25 mg Oral Daily  . sodium zirconium cyclosilicate  10 g Oral BID  . traZODone  50 mg Oral QHS  . cyanocobalamin  1,000 mcg Oral Daily   sodium chloride, sodium chloride, sodium chloride, acetaminophen **OR** acetaminophen, alteplase, antiseptic oral rinse, heparin, HYDROmorphone (DILAUDID) injection, lidocaine (PF), lidocaine-prilocaine, morphine injection, ondansetron **OR** ondansetron (ZOFRAN) IV, ondansetron (ZOFRAN) IV,  pentafluoroprop-tetrafluoroeth, traMADol  Assessment/ Plan:  48 y.o.hispanic male with end-stage renal disease, diabetes, hypertension, secondary hyperparathyroidism, hospitalization for Pseudomonas peritonitis in November 2019 at [facility],  Jake Gomez [facility]/111 kg/4 fills x 3000 cc, last fill 2500 (icodextrine), manual exchange 2500 cc   1.  ESRD, peritoneal dialysis 2.  Abdominal pain with cloudy fluid, acute pseudomonal peritonitis 3.  Anemia of chronic kidney disease 4.  Diabetes with CKD 5.  Secondary hyperparathyroidism  - right internal jugular PermCath has been placed.  Patient due for dialysis today.  Orders of been prepared.  Patient will be on dialysis on Tuesday, Thursday, Saturday as an outpatient.  Treatment of peritonitis as per infectious disease and hospitalists.  Otherwise we will continue to monitor the patient's progress closely.   LOS: 8 Jake Gomez 2/5/20201:59 PM  [place], [place]  Note: This note was prepared with Dragon dictation. Any transcription errors are unintentional

## 2018-07-20 LAB — CBC
HCT: 26.4 % — ABNORMAL LOW (ref 39.0–52.0)
Hemoglobin: 8.2 g/dL — ABNORMAL LOW (ref 13.0–17.0)
MCH: 31.7 pg (ref 26.0–34.0)
MCHC: 31.1 g/dL (ref 30.0–36.0)
MCV: 101.9 fL — ABNORMAL HIGH (ref 80.0–100.0)
Platelets: 291 10*3/uL (ref 150–400)
RBC: 2.59 MIL/uL — ABNORMAL LOW (ref 4.22–5.81)
RDW: 16.5 % — ABNORMAL HIGH (ref 11.5–15.5)
WBC: 11.4 10*3/uL — ABNORMAL HIGH (ref 4.0–10.5)
nRBC: 0.2 % (ref 0.0–0.2)

## 2018-07-20 LAB — HEPATITIS B SURFACE ANTIBODY,QUALITATIVE: Hep B S Ab: NONREACTIVE

## 2018-07-20 LAB — GLUCOSE, CAPILLARY
Glucose-Capillary: 104 mg/dL — ABNORMAL HIGH (ref 70–99)
Glucose-Capillary: 154 mg/dL — ABNORMAL HIGH (ref 70–99)
Glucose-Capillary: 155 mg/dL — ABNORMAL HIGH (ref 70–99)

## 2018-07-20 LAB — PARATHYROID HORMONE, INTACT (NO CA): PTH: 19 pg/mL (ref 15–65)

## 2018-07-20 LAB — HEPATITIS B SURFACE ANTIGEN: Hepatitis B Surface Ag: NEGATIVE

## 2018-07-20 MED ORDER — RENA-VITE PO TABS
1.0000 | ORAL_TABLET | Freq: Every day | ORAL | Status: DC
  Administered 2018-07-20 – 2018-07-21 (×2): 1 via ORAL
  Filled 2018-07-20 (×2): qty 1

## 2018-07-20 MED ORDER — NEPRO/CARBSTEADY PO LIQD
237.0000 mL | Freq: Two times a day (BID) | ORAL | Status: DC
  Administered 2018-07-20 – 2018-07-22 (×3): 237 mL via ORAL

## 2018-07-20 NOTE — Progress Notes (Signed)
OT Cancellation Note  Patient Details Name: Burns Timson MRN: [id number] DOB: 1971/06/12   Cancelled Treatment:    Reason Eval/Treat Not Completed: Patient at procedure or test/ unavailable. Upon attempt, transport staff preparing to take pt off floor for HD. Will re-attempt OT tx at later date/time as pt is available and medically appropriate.  Jeni Salles, MPH, MS, OTR/L ascom [phone] 07/20/18, 9:23 AM

## 2018-07-20 NOTE — Progress Notes (Signed)
Pre HD assessment    07/20/18 0934  Neurological  Level of Consciousness Alert  Respiratory  Respiratory Pattern Regular;Unlabored  Chest Assessment Chest expansion symmetrical  Cardiac  Pulse Regular  ECG Monitor Yes  Vascular  R Radial Pulse +2  L Radial Pulse +2  Integumentary  Integumentary (WDL) X  Skin Color Appropriate for ethnicity  Musculoskeletal  Musculoskeletal (WDL) X  Generalized Weakness Yes  Assistive Device None  GU Assessment  Genitourinary (WDL) X  Genitourinary Symptoms  (HD)  Psychosocial  Psychosocial (WDL) WDL

## 2018-07-20 NOTE — Progress Notes (Signed)
Pre HD assessment   07/20/18 0933  Vital Signs  Temp 98.4 F (36.9 C)  Temp Source Oral  Pulse Rate 75  Pulse Rate Source Monitor  Resp 19  BP (!) 161/71  BP Location Left Arm  BP Method Automatic  Patient Position (if appropriate) Lying  Oxygen Therapy  SpO2 (!) 88 %  O2 Device Room Air  Pain Assessment  Pain Scale 0-10  Pain Score 0  Dialysis Weight  Weight 118.4 kg  Type of Weight Pre-Dialysis  Time-Out for Hemodialysis  What Procedure? HD   Pt Identifiers(min of two) First/Last Name;MRN/Account#  Correct Site? Yes  Correct Side? Yes  Correct Procedure? Yes  Consents Verified? Yes  Rad Studies Available? N/A  Safety Precautions Reviewed? Yes  Engineer, civil (consulting) Number  (4A)  Station Number 2  UF/Alarm Test Passed  Conductivity: Meter 13.8  Conductivity: Machine  14.1  pH 7.4  Reverse Osmosis main  Normal Saline Lot Number [id number]  Dialyzer Lot Number [id number]  Disposable Set Lot Number [id number]  Machine Temperature 98.6 F (37 C)  Musician and Audible Yes  Blood Lines Intact and Secured Yes  Pre Treatment Patient Checks  Vascular access used during treatment Catheter  Hepatitis B Surface Antigen Results Negative  Date Hepatitis B Surface Antigen Drawn 07/19/18  Hepatitis B Surface Antibody  (<10)  Date Hepatitis B Surface Antibody Drawn 07/19/18  Hemodialysis Consent Verified Yes  Hemodialysis Standing Orders Initiated Yes  ECG (Telemetry) Monitor On Yes  Prime Ordered Normal Saline  Length of  DialysisTreatment -hour(s) 3 Hour(s)  Dialyzer Elisio 17H NR  Dialysate 3K, 2.5 Ca  Dialysis Anticoagulant None  Dialysate Flow Ordered 600  Blood Flow Rate Ordered 300 mL/min  Ultrafiltration Goal 0.5 Liters  Pre Treatment Labs Phosphorus  Dialysis Blood Pressure Support Ordered Normal Saline  Education / Care Plan  Dialysis Education Provided Yes  Documented Education in Care Plan Yes  Hemodialysis Catheter Right Internal jugular  Placement  Date: 07/19/18   Placed prior to admission: No  Orientation: Right  Access Location: Internal jugular  Site Condition No complications  Blue Lumen Status Heparin locked  Red Lumen Status Heparin locked  Purple Lumen Status N/A  Dressing Type Gauze/Drain sponge  Dressing Status Clean;Dry;Intact  Drainage Description None

## 2018-07-20 NOTE — Progress Notes (Signed)
Post HD assessment. Pt tolerated tx well without c/o or complication. Net UF 533, goal met.    07/20/18 1254  Vital Signs  Temp 97.7 F (36.5 C)  Temp Source Oral  Pulse Rate 80  Pulse Rate Source Monitor  Resp (!) 28  BP (!) 144/88  BP Location Left Arm  BP Method Automatic  Patient Position (if appropriate) Lying  Oxygen Therapy  SpO2 95 %  O2 Device Room Air  Dialysis Weight  Weight 117.2 kg  Type of Weight Post-Dialysis  Post-Hemodialysis Assessment  Rinseback Volume (mL) 250 mL  KECN 53.7 V  Dialyzer Clearance Lightly streaked  Duration of HD Treatment -hour(s) 3 hour(s)  Hemodialysis Intake (mL) 500 mL  UF Total -Machine (mL) 1033 mL  Net UF (mL) 533 mL  Tolerated HD Treatment Yes  Education / Care Plan  Dialysis Education Provided Yes  Documented Education in Care Plan Yes  Hemodialysis Catheter Right Internal jugular  Placement Date: 07/19/18   Placed prior to admission: No  Orientation: Right  Access Location: Internal jugular  Site Condition No complications  Blue Lumen Status Heparin locked  Red Lumen Status Heparin locked  Purple Lumen Status N/A  Catheter fill solution Heparin 1000 units/ml  Catheter fill volume (Arterial) 1.7 cc  Catheter fill volume (Venous) 1.7  Dressing Type Biopatch  Dressing Status Dressing changed;Clean;Dry;Intact  Interventions New dressing  Drainage Description None  Dressing Change Due 07/27/18  Post treatment catheter status Capped and Clamped

## 2018-07-20 NOTE — Progress Notes (Signed)
HD tx start    07/20/18 0940  Vital Signs  Temp 98.4 F (36.9 C)  Temp Source Oral  Pulse Rate 74  Pulse Rate Source Monitor  Resp 17  BP (!) 162/83  BP Location Left Arm  BP Method Automatic  Patient Position (if appropriate) Lying  Oxygen Therapy  SpO2 90 %  O2 Device Room Air  During Hemodialysis Assessment  Blood Flow Rate (mL/min) 300 mL/min  Arterial Pressure (mmHg) -120 mmHg  Venous Pressure (mmHg) 130 mmHg  Transmembrane Pressure (mmHg) 50 mmHg  Ultrafiltration Rate (mL/min) 330 mL/min  Dialysate Flow Rate (mL/min) 600 ml/min  Conductivity: Machine  13.9  HD Safety Checks Performed Yes  Dialysis Fluid Bolus Normal Saline  Bolus Amount (mL) 250 mL  Intra-Hemodialysis Comments Tx initiated  Hemodialysis Catheter Right Internal jugular  Placement Date: 07/19/18   Placed prior to admission: No  Orientation: Right  Access Location: Internal jugular  Blue Lumen Status Infusing  Red Lumen Status Infusing

## 2018-07-20 NOTE — Progress Notes (Signed)
HD tx end    07/20/18 1245  Vital Signs  Pulse Rate 73  Pulse Rate Source Monitor  Resp 18  BP (!) 144/72  BP Location Left Arm  BP Method Automatic  Patient Position (if appropriate) Lying  Oxygen Therapy  SpO2 92 %  O2 Device Room Air  During Hemodialysis Assessment  Dialysis Fluid Bolus Normal Saline  Bolus Amount (mL) 250 mL  Intra-Hemodialysis Comments Tx completed

## 2018-07-20 NOTE — Progress Notes (Signed)
[place], [place] 07/20/18  Subjective:  Patient seen and evaluated during hemodialysis. Tolerating well. Outpatient hemodialysis seat has been secured for the patient.  Objective:  Vital signs in last 24 hours:  Temp:  [97.6 F (36.4 C)-98.7 F (37.1 C)] 98.4 F (36.9 C) (02/06 0933) Pulse Rate:  [67-102] 73 (02/06 1100) Resp:  [9-21] 21 (02/06 1100) BP: (86-162)/(48-83) 129/74 (02/06 1100) SpO2:  [88 %-100 %] 93 % (02/06 1100) Weight:  [117.1 kg-118.4 kg] 118.4 kg (02/06 0933)  Weight change:  Filed Weights   07/19/18 1018 07/20/18 0623 07/20/18 0933  Weight: 115.9 kg 117.1 kg 118.4 kg    Intake/Output:    Intake/Output Summary (Last 24 hours) at 07/20/2018 1111 Last data filed at 07/19/2018 2047 Gross per 24 hour  Intake 104.72 ml  Output -500 ml  Net 604.72 ml     Physical Exam: General:  No acute distress, laying in the bed  HEENT  moist oral mucous membranes  Neck  supple  Pulm/lungs  CTAB normal effort  CVS/Heart  regular rhythm  Abdomen:   Soft, mild incisional tenderness, otherwise rebound improved  Extremities:  No edema  Neurologic:  Alert, oriented x 3 follows commands  Skin:  No acute rashes  Access:  right internal jugular PermCath in place       Basic Metabolic Panel:  Recent Labs  Lab 07/15/18 1247 07/17/18 0459 07/19/18 0459 07/19/18 1305  NA 130* 131* 131*  --   K 4.0 4.6 4.3  --   CL 91* 90* 90*  --   CO2 27 28 25   --   GLUCOSE 198* 169* 142*  --   BUN 55* 61* 84*  --   CREATININE 11.39* 12.09* 14.13*  --   CALCIUM 9.2 10.0 9.7  --   MG  --   --  2.4  --   PHOS  --   --   --  10.9*     CBC: Recent Labs  Lab 07/15/18 1247 07/17/18 0459 07/19/18 0459 07/19/18 0730 07/20/18 0302  WBC 14.7* 12.6* 12.1*  --  11.4*  NEUTROABS 11.7*  --   --   --   --   HGB 8.4* 8.6* 6.7* 8.0* 8.2*  HCT 26.3* 27.3* 21.2* 25.7* 26.4*  MCV 99.6 100.4* 100.5*  --  101.9*  PLT 152 224 296  --  291      Lab  Results  Component Value Date   HEPBSAG Negative 07/19/2018   HEPBSAB Non Reactive 07/19/2018    Results for KHALEE, MAZO (MRN [id number]) as of 07/12/2018 10:22  Ref. Range 07/11/2018 21:36  WBC, Fluid Latest Units: cu mm 4,121  Lymphs, Fluid Latest Units: % 5  Eos, Fluid Latest Units: % 2  Appearance, Fluid Latest Ref Range: CLEAR  HAZY (A)  Other Cells, Fluid Latest Units: % 0  Neutrophil Count, Fluid Latest Units: % 92  Monocyte-Macrophage-Serous Fluid Latest Units: % 1    Microbiology:  Recent Results (from the past 240 hour(s))  Body fluid culture     Status: None   Collection Time: 07/11/18  9:35 PM  Result Value Ref Range Status   Specimen Description FLUID PERITONEAL  Final   Special Requests NONE  Final   Gram Stain   Final    WBC PRESENT, PREDOMINANTLY PMN GRAM NEGATIVE RODS CYTOSPIN SMEAR Performed at [facility], [address], [place], [place] [address]    Culture FEW PSEUDOMONAS AERUGINOSA  Final   Report  Status 07/15/2018 FINAL  Final   Organism ID, Bacteria PSEUDOMONAS AERUGINOSA  Final      Susceptibility   Pseudomonas aeruginosa - MIC*    CEFTAZIDIME <=1 SENSITIVE Sensitive     CIPROFLOXACIN <=0.25 SENSITIVE Sensitive     GENTAMICIN <=1 SENSITIVE Sensitive     IMIPENEM 2 SENSITIVE Sensitive     PIP/TAZO 8 SENSITIVE Sensitive     CEFEPIME <=1 SENSITIVE Sensitive     * FEW PSEUDOMONAS AERUGINOSA  Surgical PCR screen     Status: None   Collection Time: 07/17/18  8:24 AM  Result Value Ref Range Status   MRSA, PCR NEGATIVE NEGATIVE Final   Staphylococcus aureus NEGATIVE NEGATIVE Final    Comment: (NOTE) The Xpert SA Assay (FDA approved for NASAL specimens in patients 48 years of age and older), is one component of a comprehensive surveillance program. It is not intended to diagnose infection nor to guide or monitor treatment. Performed at [facility], [address], [place], [place] [address]   Anaerobic culture     Status: None  (Preliminary result)   Collection Time: 07/17/18  9:53 AM  Result Value Ref Range Status   Specimen Description   Final    URINE, CATHETERIZED Performed at [facility], [address], [place], [place] [address]    Special Requests   Final    NONE Performed at [facility], [address] [place], [place] [address]    Culture   Final    NO ANAEROBES ISOLATED; CULTURE IN PROGRESS FOR 5 DAYS   Report Status PENDING  Incomplete  Aerobic Culture (superficial specimen)     Status: None   Collection Time: 07/17/18  9:53 AM  Result Value Ref Range Status   Specimen Description   Final    URINE, CATHETERIZED Performed at [facility], [address] [place], [place] [address]    Special Requests   Final    NONE Performed at [facility], [address] [place], [place] [address]    Gram Stain   Final    NO WBC SEEN NO ORGANISMS SEEN Performed at [facility], [place] [address], [place], [place] [address]    Culture FEW PSEUDOMONAS AERUGINOSA  Final   Report Status 07/19/2018 FINAL  Final   Organism ID, Bacteria PSEUDOMONAS AERUGINOSA  Final      Susceptibility   Pseudomonas aeruginosa - MIC*    CEFTAZIDIME <=1 SENSITIVE Sensitive     CIPROFLOXACIN <=0.25 SENSITIVE Sensitive     GENTAMICIN <=1 SENSITIVE Sensitive     IMIPENEM 2 SENSITIVE Sensitive     PIP/TAZO 8 SENSITIVE Sensitive     CEFEPIME <=1 SENSITIVE Sensitive     * FEW PSEUDOMONAS AERUGINOSA  Cath Tip Culture     Status: Abnormal   Collection Time: 07/17/18  9:53 AM  Result Value Ref Range Status   Specimen Description CATH TIP PERITONEAL  Final   Special Requests   Final    NONE Performed at [facility], [place] [address], [place], [place] [address]    Culture >=100,000 COLONIES/mL PSEUDOMONAS AERUGINOSA (A)  Final   Report Status 07/19/2018 FINAL  Final   Organism ID, Bacteria PSEUDOMONAS AERUGINOSA (A)  Final      Susceptibility   Pseudomonas aeruginosa -  MIC*    CEFTAZIDIME 2 SENSITIVE Sensitive     CIPROFLOXACIN <=0.25 SENSITIVE Sensitive     GENTAMICIN <=1 SENSITIVE Sensitive     IMIPENEM 2 SENSITIVE  Sensitive     PIP/TAZO 8 SENSITIVE Sensitive     CEFEPIME <=1 SENSITIVE Sensitive     * >=100,000 COLONIES/mL PSEUDOMONAS AERUGINOSA    Coagulation Studies: No results for input(s): LABPROT, INR in the last 72 hours.  Urinalysis: No results for input(s): COLORURINE, LABSPEC, PHURINE, GLUCOSEU, HGBUR, BILIRUBINUR, KETONESUR, PROTEINUR, UROBILINOGEN, NITRITE, LEUKOCYTESUR in the last 72 hours.  Invalid input(s): APPERANCEUR    Imaging: No results found.   Medications:   . sodium chloride Stopped (07/19/18 1950)  . cefTAZidime (FORTAZ)  IV Stopped (07/19/18 1855)  . dialysis solution 2.5% low-MG/low-CA     . sodium chloride   Intravenous Once  . atorvastatin  80 mg Oral q morning - 10a  . calcium acetate  2,001 mg Oral TID WC  . carvedilol  6.25 mg Oral BID WC  . Chlorhexidine Gluconate Cloth  6 each Topical Q0600  . docusate sodium  100 mg Oral BID  . epoetin (EPOGEN/PROCRIT) injection  20,000 Units Intravenous Weekly  . fluticasone  2 spray Each Nare Daily  . folic acid  1 mg Oral Daily  . gentamicin cream  1 application Topical Daily  . heparin  5,000 Units Subcutaneous Q8H  . insulin aspart  0-5 Units Subcutaneous QHS  . insulin aspart  0-9 Units Subcutaneous TID WC  . irbesartan  300 mg Oral QHS  . lactulose  30 g Oral Daily  . polyethylene glycol  17 g Oral Daily  . sertraline  25 mg Oral Daily  . sodium zirconium cyclosilicate  10 g Oral BID  . traZODone  50 mg Oral QHS  . cyanocobalamin  1,000 mcg Oral Daily   sodium chloride, acetaminophen **OR** acetaminophen, antiseptic oral rinse, morphine injection, ondansetron **OR** ondansetron (ZOFRAN) IV, ondansetron (ZOFRAN) IV, traMADol  Assessment/ Plan:  48 y.o.hispanic male with end-stage renal disease, diabetes, hypertension, secondary hyperparathyroidism,  hospitalization for Pseudomonas peritonitis in November 2019 at [facility],  [name]/111 kg/4 fills x 3000 cc, last fill 2500 (icodextrine), manual exchange 2500 cc   1.  ESRD, transitioned to hemodialysis after severe peritonitis.  2.  Abdominal pain with cloudy fluid, acute pseudomonal peritonitis 3.  Anemia of chronic kidney disease 4.  Diabetes with CKD 5.  Secondary hyperparathyroidism  -Patient seen and evaluated during hemodialysis and tolerating quite well.  He has made the transition to hemodialysis at this point in time.  We have secured outpatient hemodialysis seat for the patient at the [facility] facility.  She will undergo hemodialysis treatment tomorrow and thereafter his next treatment will be on Tuesday as an outpatient.  Continue treatment of peritonitis.   LOS: 9 Fletcher Ostermiller 2/6/202011:11 AM  [place], [place]  Note: This note was prepared with Dragon dictation. Any transcription errors are unintentional

## 2018-07-20 NOTE — Progress Notes (Signed)
Post HD assessment    07/20/18 1253  Neurological  Level of Consciousness Alert  Respiratory  Respiratory Pattern Regular;Unlabored  Chest Assessment Chest expansion symmetrical  Cardiac  Pulse Regular  ECG Monitor Yes  Vascular  R Radial Pulse +2  L Radial Pulse +2  Integumentary  Integumentary (WDL) X  Skin Color Appropriate for ethnicity  Musculoskeletal  Musculoskeletal (WDL) X  Generalized Weakness Yes  Assistive Device None  GU Assessment  Genitourinary (WDL) X  Genitourinary Symptoms  (HD)  Psychosocial  Psychosocial (WDL) WDL

## 2018-07-20 NOTE — Progress Notes (Signed)
[place] at [place] NAME: Jake Gomez    MR#:  [id number]  DATE OF BIRTH:  [birth year]  Hemodialysis today, abdominal pain is better today.  No other complaints.  No fever.   REVIEW OF SYSTEMS:    Review of Systems  Constitutional: Negative for chills, fever and weight loss.  HENT: Negative for congestion, hearing loss and tinnitus.   Eyes: Negative for blurred vision and double vision.  Respiratory: Negative for cough, shortness of breath and wheezing.   Cardiovascular: Negative for chest pain, orthopnea and PND.  Gastrointestinal: Positive for abdominal pain. Negative for diarrhea, nausea and vomiting.  Genitourinary: Negative for dysuria and hematuria.  Neurological: Negative for dizziness, sensory change and focal weakness.  All other systems reviewed and are negative.   Nutrition: Renal/Carb modified Tolerating Diet: Yes Tolerating PT: Await Eval.   DRUG ALLERGIES:  No Known Allergies  VITALS:  Blood pressure (!) 144/88, pulse 78, temperature 97.7 F (36.5 C), temperature source Oral, resp. rate 15, height 5\' 8"  (1.727 m), weight 117.2 kg, SpO2 94 %.  PHYSICAL EXAMINATION:   Physical Exam  GENERAL:  48 y.o.-year-old obese patient lying in bed appears uncomfortable because of abdominal pain. EYES: Pupils equal, round, reactive to light and accommodation. No scleral icterus. Extraocular muscles intact.  HEENT: Head atraumatic, normocephalic. Oropharynx and nasopharynx clear.  NECK:  Supple, no jugular venous distention. No thyroid enlargement, no tenderness.  LUNGS: Normal breath sounds bilaterally, no wheezing, rales, rhonchi. No use of accessory muscles of respiration.  CARDIOVASCULAR: S1, S2 normal. No murmurs, rubs, or gallops.  ABDOMEN: Slight tenderness in epigastric area but overall much better.Marland Kitchen  EXTREMITIES: No cyanosis, clubbing or edema b/l.    NEUROLOGIC: Cranial nerves II through XII are intact. No focal Motor  or sensory deficits b/l.   PSYCHIATRIC: The patient is alert and oriented x 3.  SKIN: No obvious rash, lesion, or ulcer.    LABORATORY PANEL:   CBC Recent Labs  Lab 07/20/18 0302  WBC 11.4*  HGB 8.2*  HCT 26.4*  PLT 291   ------------------------------------------------------------------------------------------------------------------  Chemistries  Recent Labs  Lab 07/19/18 0459  NA 131*  K 4.3  CL 90*  CO2 25  GLUCOSE 142*  BUN 84*  CREATININE 14.13*  CALCIUM 9.7  MG 2.4   ------------------------------------------------------------------------------------------------------------------  Cardiac Enzymes No results for input(s): TROPONINI in the last 168 hours. ------------------------------------------------------------------------------------------------------------------  RADIOLOGY:  No results found.   ASSESSMENT AND PLAN:   48 year old male with past medical history of end-stage renal disease on peritoneal dialysis, hypertension, diabetes, hyperlipidemia, previous history of Pseudomonas peritonitis who presents to the hospital due to abdominal pain.  1.  Abdominal pain- due to recurrent peritonitis, peritoneal fluid this time also showing Pseudomonas.,  Patient had history of Pseudomonas infection from peritoneal fluid 2 times before.  PD catheter removed by vascular ,  permacath placed yesterday,, received hemodialysis yesterday, today, hemodialysis tomorrow and discharge after hemodialysis tomorrow, patient already has outpatient dialysis chair Tuesday, Thursday, Saturday as per nephrology note.  Patient to get antibiotics with dialysis.  Likely discharge home tomorrow with the next dialysis session on Tuesday as an outpatient.  3.  Essential hypertension- continue Coreg, Avapro  4.  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   - BS stable.   5.  Depression-continue Zoloft.  6.  Hyperlipidemia-continue  atorvastatin.  7.  Secondary hyperparathyroidism-continue PhosLo.  #7 abdominal pain secondary to peritonitis, status post  peritoneal catheter removed, use tramadol, discontinue morphine. 8.  Anemia of chronic disease, ;  All the records are reviewed and case discussed with Care Management/Social Worker. Management plans discussed with the patient, family and they are in agreement.  CODE STATUS: Full code  DVT Prophylaxis: Hep. SQ  TOTAL TIME TAKING CARE OF THIS PATIENT: 30 minutes.    more than 50% time spent in counseling, coordination of care     M.D on 07/20/2018 at 3:58 PM  Between 7am to 6pm - Pager - [phone]  After 6pm go to [email or web address] - Proofreader  [facility]  Office  [phone]  CC: Primary care physician; Juline Patch, MD

## 2018-07-20 NOTE — Progress Notes (Signed)
PT Cancellation Note  Patient Details Name: Jake Gomez MRN: [id number] DOB: 26-Feb-1971   Cancelled Treatment:    Reason Eval/Treat Not Completed: Patient at procedure or test/unavailable  Pt out of room all morning for dialysis, will try back as time allows.   Kreg Shropshire, DPT 07/20/2018, 12:19 PM

## 2018-07-21 LAB — TYPE AND SCREEN
ABO/RH(D): O POS
Antibody Screen: NEGATIVE
Unit division: 0

## 2018-07-21 LAB — QUANTIFERON-TB GOLD PLUS (RQFGPL)
QuantiFERON Mitogen Value: 0.2 IU/mL
QuantiFERON Nil Value: 0.01 IU/mL
QuantiFERON TB1 Ag Value: 0.02 IU/mL
QuantiFERON TB2 Ag Value: 0.01 IU/mL

## 2018-07-21 LAB — BPAM RBC
Blood Product Expiration Date: 202003062359
Unit Type and Rh: 5100

## 2018-07-21 LAB — GLUCOSE, CAPILLARY
Glucose-Capillary: 129 mg/dL — ABNORMAL HIGH (ref 70–99)
Glucose-Capillary: 159 mg/dL — ABNORMAL HIGH (ref 70–99)
Glucose-Capillary: 164 mg/dL — ABNORMAL HIGH (ref 70–99)

## 2018-07-21 LAB — QUANTIFERON-TB GOLD PLUS: QUANTIFERON-TB GOLD PLUS: UNDETERMINED

## 2018-07-21 LAB — PHOSPHORUS: Phosphorus: 5.5 mg/dL — ABNORMAL HIGH (ref 2.5–4.6)

## 2018-07-21 MED ORDER — RENA-VITE PO TABS: 1.0000 | ORAL_TABLET | Freq: Every day | ORAL | 0 refills | Status: DC

## 2018-07-21 MED ORDER — FLUTICASONE PROPIONATE 50 MCG/ACT NA SUSP: 2.0000 | Freq: Every day | NASAL | 6 refills | Status: DC

## 2018-07-21 MED ORDER — SODIUM CHLORIDE 0.9 % IV SOLN: 1.0000 g | INTRAVENOUS | 0 refills | Status: DC

## 2018-07-21 MED ORDER — HYDRALAZINE HCL 20 MG/ML IJ SOLN
10.0000 mg | Freq: Four times a day (QID) | INTRAMUSCULAR | Status: DC | PRN
  Administered 2018-07-21: 10 mg via INTRAVENOUS
  Filled 2018-07-21 (×2): qty 1

## 2018-07-21 MED ORDER — SODIUM CHLORIDE 0.9 % IV SOLN: 2.0000 g | INTRAVENOUS | 0 refills | Status: DC | PRN

## 2018-07-21 MED ORDER — NEPRO/CARBSTEADY PO LIQD: 237.0000 mL | Freq: Two times a day (BID) | ORAL | 0 refills | Status: DC

## 2018-07-21 MED ORDER — AMLODIPINE BESYLATE 10 MG PO TABS
10.0000 mg | ORAL_TABLET | Freq: Every day | ORAL | Status: DC
  Administered 2018-07-21 – 2018-07-22 (×2): 10 mg via ORAL
  Filled 2018-07-21 (×2): qty 1

## 2018-07-21 MED ORDER — TRAMADOL HCL 50 MG PO TABS: 50.0000 mg | ORAL_TABLET | Freq: Four times a day (QID) | ORAL | 0 refills | Status: DC | PRN

## 2018-07-21 NOTE — Care Management (Signed)
Per Elvera Bicker HD liaison patients outpatient schedule is as follows.  TTS 11:45  Am at [facility].  Patient to start outpatient next Tuesday.

## 2018-07-21 NOTE — Care Management Important Message (Signed)
Copy of signed Medicare IM left with patient in room. 

## 2018-07-21 NOTE — Progress Notes (Signed)
[place], [place] 07/21/18  Subjective:  Due for hemodialysis today. Resting comfortably at the moment. Outpatient hemodialysis has been set for the patient at [facility].  Objective:  Vital signs in last 24 hours:  Temp:  [97.7 F (36.5 C)-98.9 F (37.2 C)] 98.4 F (36.9 C) (02/07 1145) Pulse Rate:  [78-84] 83 (02/07 1145) Resp:  [15-28] 20 (02/07 0630) BP: (144-173)/(65-88) 158/67 (02/07 1145) SpO2:  [92 %-96 %] 96 % (02/07 1145) Weight:  [117.2 kg-120.8 kg] 120.8 kg (02/07 0500)  Weight change: 2.5 kg Filed Weights   07/20/18 0933 07/20/18 1254 07/21/18 0500  Weight: 118.4 kg 117.2 kg 120.8 kg    Intake/Output:    Intake/Output Summary (Last 24 hours) at 07/21/2018 1250 Last data filed at 07/20/2018 1254 Gross per 24 hour  Intake -  Output 533 ml  Net -533 ml     Physical Exam: General:  No acute distress, laying in the bed  HEENT  moist oral mucous membranes  Neck  supple  Pulm/lungs  CTAB normal effort  CVS/Heart  regular rhythm  Abdomen:   Soft, mild incisional tenderness, otherwise rebound improved  Extremities:  No edema  Neurologic:  Alert, oriented x 3 follows commands  Skin:  No acute rashes  Access:  right internal jugular PermCath in place       Basic Metabolic Panel:  Recent Labs  Lab 07/15/18 1247 07/17/18 0459 07/19/18 0459 07/19/18 1305  NA 130* 131* 131*  --   K 4.0 4.6 4.3  --   CL 91* 90* 90*  --   CO2 27 28 25   --   GLUCOSE 198* 169* 142*  --   BUN 55* 61* 84*  --   CREATININE 11.39* 12.09* 14.13*  --   CALCIUM 9.2 10.0 9.7  --   MG  --   --  2.4  --   PHOS  --   --   --  10.9*     CBC: Recent Labs  Lab 07/15/18 1247 07/17/18 0459 07/19/18 0459 07/19/18 0730 07/20/18 0302  WBC 14.7* 12.6* 12.1*  --  11.4*  NEUTROABS 11.7*  --   --   --   --   HGB 8.4* 8.6* 6.7* 8.0* 8.2*  HCT 26.3* 27.3* 21.2* 25.7* 26.4*  MCV 99.6 100.4* 100.5*  --  101.9*  PLT 152 224 296  --  291      Lab  Results  Component Value Date   HEPBSAG Negative 07/19/2018   HEPBSAB Non Reactive 07/19/2018    Results for Jake Gomez, Jake Gomez (MRN [id number]) as of 07/12/2018 10:22  Ref. Range 07/11/2018 21:36  WBC, Fluid Latest Units: cu mm 4,121  Lymphs, Fluid Latest Units: % 5  Eos, Fluid Latest Units: % 2  Appearance, Fluid Latest Ref Range: CLEAR  HAZY (A)  Other Cells, Fluid Latest Units: % 0  Neutrophil Count, Fluid Latest Units: % 92  Monocyte-Macrophage-Serous Fluid Latest Units: % 1    Microbiology:  Recent Results (from the past 240 hour(s))  Body fluid culture     Status: None   Collection Time: 07/11/18  9:35 PM  Result Value Ref Range Status   Specimen Description FLUID PERITONEAL  Final   Special Requests NONE  Final   Gram Stain   Final    WBC PRESENT, PREDOMINANTLY PMN GRAM NEGATIVE RODS CYTOSPIN SMEAR Performed at [facility], [address], [place], [place] [address]    Culture FEW PSEUDOMONAS AERUGINOSA  Final  Report Status 07/15/2018 FINAL  Final   Organism ID, Bacteria PSEUDOMONAS AERUGINOSA  Final      Susceptibility   Pseudomonas aeruginosa - MIC*    CEFTAZIDIME <=1 SENSITIVE Sensitive     CIPROFLOXACIN <=0.25 SENSITIVE Sensitive     GENTAMICIN <=1 SENSITIVE Sensitive     IMIPENEM 2 SENSITIVE Sensitive     PIP/TAZO 8 SENSITIVE Sensitive     CEFEPIME <=1 SENSITIVE Sensitive     * FEW PSEUDOMONAS AERUGINOSA  Surgical PCR screen     Status: None   Collection Time: 07/17/18  8:24 AM  Result Value Ref Range Status   MRSA, PCR NEGATIVE NEGATIVE Final   Staphylococcus aureus NEGATIVE NEGATIVE Final    Comment: (NOTE) The Xpert SA Assay (FDA approved for NASAL specimens in patients 26 years of age and older), is one component of a comprehensive surveillance program. It is not intended to diagnose infection nor to guide or monitor treatment. Performed at [facility], [address], [place], [place] [address]   Anaerobic culture     Status: None  (Preliminary result)   Collection Time: 07/17/18  9:53 AM  Result Value Ref Range Status   Specimen Description   Final    URINE, CATHETERIZED Performed at [facility], [address] [place], [place] [address]    Special Requests   Final    NONE Performed at [facility], [address] [place], [place] [address]    Culture   Final    NO ANAEROBES ISOLATED; CULTURE IN PROGRESS FOR 5 DAYS   Report Status PENDING  Incomplete  Aerobic Culture (superficial specimen)     Status: None   Collection Time: 07/17/18  9:53 AM  Result Value Ref Range Status   Specimen Description   Final    URINE, CATHETERIZED Performed at [facility], [address] [place], [place] [address]    Special Requests   Final    NONE Performed at [facility], [address] [place], [place] [address]    Gram Stain   Final    NO WBC SEEN NO ORGANISMS SEEN Performed at [facility], [place] [address], [place], [place] [address]    Culture FEW PSEUDOMONAS AERUGINOSA  Final   Report Status 07/19/2018 FINAL  Final   Organism ID, Bacteria PSEUDOMONAS AERUGINOSA  Final      Susceptibility   Pseudomonas aeruginosa - MIC*    CEFTAZIDIME <=1 SENSITIVE Sensitive     CIPROFLOXACIN <=0.25 SENSITIVE Sensitive     GENTAMICIN <=1 SENSITIVE Sensitive     IMIPENEM 2 SENSITIVE Sensitive     PIP/TAZO 8 SENSITIVE Sensitive     CEFEPIME <=1 SENSITIVE Sensitive     * FEW PSEUDOMONAS AERUGINOSA  Cath Tip Culture     Status: Abnormal   Collection Time: 07/17/18  9:53 AM  Result Value Ref Range Status   Specimen Description CATH TIP PERITONEAL  Final   Special Requests   Final    NONE Performed at [facility], [place] [address] [place], [place] [address]    Culture >=100,000 COLONIES/mL PSEUDOMONAS AERUGINOSA (A)  Final   Report Status 07/19/2018 FINAL  Final   Organism ID, Bacteria PSEUDOMONAS AERUGINOSA (A)  Final      Susceptibility   Pseudomonas aeruginosa -  MIC*    CEFTAZIDIME 2 SENSITIVE Sensitive     CIPROFLOXACIN <=0.25 SENSITIVE Sensitive     GENTAMICIN <=1 SENSITIVE Sensitive     IMIPENEM 2  SENSITIVE Sensitive     PIP/TAZO 8 SENSITIVE Sensitive     CEFEPIME <=1 SENSITIVE Sensitive     * >=100,000 COLONIES/mL PSEUDOMONAS AERUGINOSA    Coagulation Studies: No results for input(s): LABPROT, INR in the last 72 hours.  Urinalysis: No results for input(s): COLORURINE, LABSPEC, PHURINE, GLUCOSEU, HGBUR, BILIRUBINUR, KETONESUR, PROTEINUR, UROBILINOGEN, NITRITE, LEUKOCYTESUR in the last 72 hours.  Invalid input(s): APPERANCEUR    Imaging: No results found.   Medications:   . sodium chloride Stopped (07/19/18 1950)  . cefTAZidime (FORTAZ)  IV 1 g (07/20/18 1808)  . dialysis solution 2.5% low-MG/low-CA     . sodium chloride   Intravenous Once  . atorvastatin  80 mg Oral q morning - 10a  . calcium acetate  2,001 mg Oral TID WC  . carvedilol  6.25 mg Oral BID WC  . Chlorhexidine Gluconate Cloth  6 each Topical Q0600  . docusate sodium  100 mg Oral BID  . epoetin (EPOGEN/PROCRIT) injection  20,000 Units Intravenous Weekly  . feeding supplement (NEPRO CARB STEADY)  237 mL Oral BID BM  . fluticasone  2 spray Each Nare Daily  . folic acid  1 mg Oral Daily  . gentamicin cream  1 application Topical Daily  . heparin  5,000 Units Subcutaneous Q8H  . insulin aspart  0-5 Units Subcutaneous QHS  . insulin aspart  0-9 Units Subcutaneous TID WC  . irbesartan  300 mg Oral QHS  . lactulose  30 g Oral Daily  . multivitamin  1 tablet Oral QHS  . polyethylene glycol  17 g Oral Daily  . sertraline  25 mg Oral Daily  . sodium zirconium cyclosilicate  10 g Oral BID  . traZODone  50 mg Oral QHS  . cyanocobalamin  1,000 mcg Oral Daily   sodium chloride, acetaminophen **OR** acetaminophen, antiseptic oral rinse, morphine injection, ondansetron **OR** ondansetron (ZOFRAN) IV, ondansetron (ZOFRAN) IV, traMADol  Assessment/ Plan:  48 y.o.hispanic  male with end-stage renal disease, diabetes, hypertension, secondary hyperparathyroidism, hospitalization for Pseudomonas peritonitis in November 2019 at [facility],  [name]/111 kg/4 fills x 3000 cc, last fill 2500 (icodextrine), manual exchange 2500 cc   1.  ESRD, transitioned to hemodialysis after severe peritonitis.  2.  Abdominal pain with cloudy fluid, acute pseudomonal peritonitis 3.  Anemia of chronic kidney disease 4.  Diabetes with CKD 5.  Secondary hyperparathyroidism  -Patient to undergo hemodialysis today.  Outpatient hemodialysis seat has been secured at [facility].  He will start there on Tuesday.  Patient will be treated for 4 weeks with ceftazidime 2 g IV with each dialysis treatment.   LOS: 10 Chantavia Bazzle 2/7/202012:50 PM  [place], [place]  Note: This note was prepared with Dragon dictation. Any transcription errors are unintentional

## 2018-07-21 NOTE — Progress Notes (Signed)
HD Tx Start   07/21/18 1432  Vital Signs  Pulse Rate 80  Pulse Rate Source Monitor  Resp 20  BP (!) 166/69  BP Location Right Arm  BP Method Automatic  Patient Position (if appropriate) Lying  Oxygen Therapy  SpO2 93 %  O2 Device Room Air  During Hemodialysis Assessment  Blood Flow Rate (mL/min) 400 mL/min  Arterial Pressure (mmHg) -160 mmHg  Venous Pressure (mmHg) 130 mmHg  Transmembrane Pressure (mmHg) 50 mmHg  Ultrafiltration Rate (mL/min) 570 mL/min  Dialysate Flow Rate (mL/min) 800 ml/min  Conductivity: Machine  14.1  HD Safety Checks Performed Yes  Dialysis Fluid Bolus Normal Saline  Bolus Amount (mL) 250 mL  Intra-Hemodialysis Comments Tx initiated  Hemodialysis Catheter Right Internal jugular  Placement Date: 07/19/18   Placed prior to admission: No  Orientation: Right  Access Location: Internal jugular  Blue Lumen Status Infusing  Red Lumen Status Infusing

## 2018-07-21 NOTE — Progress Notes (Signed)
Post HD Assessment Tolerated tx well, no complaints or complications. Goal met, UF removal 1533m.   07/21/18 1818  Vital Signs  Temp 98.8 F (37.1 C)  Temp Source Oral  Pulse Rate 72  Pulse Rate Source Monitor  Resp 15  BP (!) 201/79  BP Location Right Arm  BP Method Automatic  Patient Position (if appropriate) Sitting  Oxygen Therapy  SpO2 97 %  O2 Device Room Air  Dialysis Weight  Weight 113 kg  Type of Weight Post-Dialysis  Post-Hemodialysis Assessment  Rinseback Volume (mL) 250 mL  KECN 78.5 V  Dialyzer Clearance Lightly streaked  Duration of HD Treatment -hour(s) 3.5 hour(s)  Hemodialysis Intake (mL) 500 mL  UF Total -Machine (mL) 2013 mL  Net UF (mL) 1513 mL  Tolerated HD Treatment Yes  Post-Hemodialysis Comments  (Pt tolerated tx well, stable to discharge to unit)  Education / Care Plan  Dialysis Education Provided Yes  Documented Education in Care Plan Yes  Hemodialysis Catheter Right Internal jugular  Placement Date: 07/19/18   Placed prior to admission: No  Orientation: Right  Access Location: Internal jugular  Site Condition No complications  Blue Lumen Status Saline locked  Red Lumen Status Saline locked  Purple Lumen Status N/A  Catheter fill solution Heparin 1000 units/ml  Catheter fill volume (Arterial) 1.7 cc  Catheter fill volume (Venous) 1.7  Dressing Type Biopatch  Dressing Status Clean;Dry;Intact  Drainage Description None  Post treatment catheter status Capped and Clamped

## 2018-07-21 NOTE — Care Management Note (Signed)
Case Management Note  Patient Details  Name: Jake Gomez MRN: [id number] Date of Birth: Apr 26, 1971   Plan for patient to discharge today after HD and start outpatient HD on Tuesday. Elvera Bicker dialysis liaison notified of discharge. Corene Cornea with [facility] notified of discharge.  RW to be delivered to room prior to discharge by brad from [place].  Patient to received IV antibiotics outpatient at HD.  Elvera Bicker has the hard copy of the rx to fax to the clinic. RNCM signing off.   Subjective/Objective:                    Action/Plan:   Expected Discharge Date:  07/21/18               Expected Discharge Plan:  [name]  In-House Referral:     Discharge planning Services  CM Consult  Post Acute Care Choice:  Durable Medical Equipment, Home Health Choice offered to:  Patient  DME Arranged:  Walker rolling DME Agency:  [place] Arranged:  RN, PT, OT, Nurse's Aide [place] Agency:  [facility]  Status of Service:  Completed, signed off  If discussed at [place] of Stay Meetings, dates discussed:    Additional Comments:  Beverly Sessions, RN 07/21/2018, 1:45 PM

## 2018-07-21 NOTE — Progress Notes (Signed)
[facility] Daily Progress Note   Vascular Surgery Communication Note  Peritoneal dialysis catheter removed. Permcath working well.  Patient to follow up with [place] as an outpatient for vein mapping in preparation for a hemodialysis access creation. Vascular surgery to sign off at this time.  Please re-consult if needed.   2 Days Post-Op: 1. Ultrasound guidance for vascular access to the right internal jugular vein 2. Fluoroscopic guidance for placement of catheter 3. Placement of a 23 cm tip to cuff tunneled hemodialysis catheter via the right internal jugular vein  4 Days Post-Op: 1. Removal of peritoneal dialysis catheter  Discussed with Dr. Ellis Parents [facility] PA-C 07/21/2018 10:54 AM

## 2018-07-21 NOTE — Progress Notes (Signed)
Pre HD Assessment    07/21/18 1415  Vital Signs  Temp 98.5 F (36.9 C)  Temp Source Oral  Pulse Rate Source Monitor  Resp 20  BP (!) 199/92  BP Location Right Arm  BP Method Automatic  Patient Position (if appropriate) Sitting  Oxygen Therapy  SpO2 93 %  O2 Device Room Air  Pain Assessment  Pain Scale 0-10  Pain Score 6  Dialysis Weight  Weight 114.3 kg  Type of Weight Pre-Dialysis  Time-Out for Hemodialysis  What Procedure? HD  Pt Identifiers(min of two) First/Last Name;MRN/Account#  Correct Site? Yes  Correct Side? Yes  Correct Procedure? Yes  Consents Verified? Yes  Rad Studies Available? N/A  Safety Precautions Reviewed? Yes  Engineer, civil (consulting) Number 4  Station Number 2  UF/Alarm Test Passed  Conductivity: Meter 14  Conductivity: Machine  14.1  pH 7.4  Reverse Osmosis main  Normal Saline Lot Number [id number]  Dialyzer Lot Number [id number]  Disposable Set Lot Number [id number]  Machine Temperature 98.6 F (37 C)  Musician and Audible Yes  Blood Lines Intact and Secured Yes  Pre Treatment Patient Checks  Vascular access used during treatment Catheter  Patient is receiving dialysis in a chair Yes  Hepatitis B Surface Antigen Results Negative  Date Hepatitis B Surface Antigen Drawn 07/19/18  Hepatitis B Surface Antibody  (<10)  Date Hepatitis B Surface Antibody Drawn 07/19/18  Hemodialysis Consent Verified Yes  Hemodialysis Standing Orders Initiated Yes  ECG (Telemetry) Monitor On Yes  Prime Ordered Normal Saline  Length of  DialysisTreatment -hour(s) 3.5 Hour(s)  Dialyzer Elisio 17H NR  Dialysate 3K, 2.5 Ca  Dialysis Anticoagulant Heparin  Dialysate Flow Ordered 800  Blood Flow Rate Ordered 400 mL/min  Ultrafiltration Goal 1.5 Liters  Pre Treatment Labs Phosphorus  Dialysis Blood Pressure Support Ordered Normal Saline  Education / Care Plan  Dialysis Education Provided Yes  Documented Education in Care Plan Yes  Hemodialysis Catheter Right  Internal jugular  Placement Date: 07/19/18   Placed prior to admission: No  Orientation: Right  Access Location: Internal jugular  Site Condition No complications  Blue Lumen Status Heparin locked  Red Lumen Status Heparin locked  Purple Lumen Status N/A  Dressing Type Biopatch  Dressing Status Clean;Dry;Intact  Drainage Description None

## 2018-07-21 NOTE — Discharge Summary (Signed)
Jake Gomez, is a 48 y.o. male  DOB [birth year]  MRN [id number].  Admission date:  07/10/2018  Admitting Physician  Harrie Foreman, MD  Discharge Date:  07/21/2018   Primary MD  Juline Patch, MD  Recommendations for primary care physician for things to follow:   Follow-up with PCP in 1 week Follow-up with nephrology as scheduled for outpatient nephrology Tuesday, Thursday, Saturday.   Admission Diagnosis  Spontaneous bacterial peritonitis ([place]) [K65.2]   Discharge Diagnosis  Spontaneous bacterial peritonitis ([place]) [K65.2]    Active Problems:   Sepsis [facility])      Past Medical History:  Diagnosis Date  . Diabetes mellitus without complication ([place])   . Hypertension   . Renal disorder     Past Surgical History:  Procedure Laterality Date  . DIALYSIS/PERMA CATHETER INSERTION N/A 07/19/2018   Procedure: DIALYSIS/PERMA CATHETER INSERTION;  Surgeon: Algernon Huxley, MD;  Location: [facility];  Service: Cardiovascular;  Laterality: N/A;  . peritonal dialysis    . REMOVAL OF A DIALYSIS CATHETER N/A 07/17/2018   Procedure: REMOVAL OF A DIALYSIS CATHETER;  Surgeon: Algernon Huxley, MD;  Location: [facility];  Service: Vascular;  Laterality: N/A;       History of present illness and  Hospital Course:     Kindly see H&P for history of present illness and admission details, please review complete Labs, Consult reports and Test reports for all details in brief  HPI  from the history and physical done on the day of admission 48 year old male patient with history of ESRD on peritoneal dialysis, essential hypertension, diabetes mellitus type 2, hyperlipidemia, previous history of Pseudomonas peritonitis comes when to the hospital because of abdominal pain and possible repeat peritonitis   Hospital Course  #1 abdominal  pain secondary to recurrent peritonitis, peritoneal fluid at this time also showed Pseudomonas, patient had history of Pseudomonas infection from peritoneal fluid 2 times before.  Peritoneal dialysis catheter removed by vascular on February 3 because of infection.  Patient now has permacath in right chest, using with that for dialysis.  Patient will be on 4 weeks of IV antibiotics with Fortaz, 4 weeks from catheter removed, this can be given with dialysis.  Initially patient had severe abdominal pain, leukocytosis, fever all that is better now, patient abdominal pain also improved, not requiring IV morphine, discharged home with a limited supply of tramadol. 2.  ESRD: Hemodialysis, patient will get hemodialysis on Tuesday, Thursday, Saturday, skin is secured outpatient spot.  Patient is converted to hemodialysis on this admission he was on peritoneal dialysis before, peritoneal Lasix catheter removed at this time because of repeat infection. 3.  Diabetes mellitus type 2 with ESRD.\;  Continue glipizide 5 mg p.o. daily at home along with sliding scale insulin. Deconditioning: Physical therapist recommended home health physical therapy, rolling walker.  Discharge home today after hemodialysis.  Discussed with Dr. Rush Landmark, Dr. Levester Fresh.     Discharge Condition:    Follow UP  Follow-up Information    Algernon Huxley, MD Follow up in 1 week(s).   Specialties:  Vascular Surgery, Radiology, Interventional Cardiology Why:  First post-op visit. See midlevel. Patient will need bilateral upper extremity vein mapping with appointment.  Contact information: [place] [address] [phone]             Discharge Instructions  and  Discharge Medications      Allergies as of 07/21/2018   No Known Allergies  Medication List    STOP taking these medications   acetaminophen 500 MG tablet Commonly known as:  TYLENOL     TAKE these medications   amLODipine 5 MG  tablet Commonly known as:  NORVASC Take 5 mg by mouth daily.   atorvastatin 80 MG tablet Commonly known as:  LIPITOR Take 1 tablet (80 mg total) by mouth every morning.   calcium acetate 667 MG capsule Commonly known as:  PHOSLO Take 2,001 mg by mouth 3 (three) times daily with meals.   carvedilol 6.25 MG tablet Commonly known as:  COREG Take 1 tablet (6.25 mg total) by mouth 2 (two) times daily with a meal.   cefTAZidime 1 g in sodium chloride 0.9 % 100 mL Inject 1 g into the vein daily for 28 days. As per Dr. Steva Ready patient needs 4 weeks of antibiotics with dialysis,   cyanocobalamin 1000 MCG tablet Take 1 tablet (1,000 mcg total) by mouth daily.   docusate sodium 100 MG capsule Commonly known as:  COLACE Take 1 capsule (100 mg total) by mouth 2 (two) times daily.   feeding supplement (NEPRO CARB STEADY) Liqd Take 237 mLs by mouth 2 (two) times daily between meals.   fluticasone 50 MCG/ACT nasal spray Commonly known as:  FLONASE Place 2 sprays into both nostrils daily.   folic acid 1 MG tablet Commonly known as:  FOLVITE Take 1 tablet (1 mg total) by mouth daily.   gabapentin 300 MG capsule Commonly known as:  NEURONTIN Take 300 mg by mouth daily.   glipiZIDE 5 MG tablet Commonly known as:  GLUCOTROL TAKE 1 TABLET BY MOUTH ONCE DAILY BEFORE BREAKFAST What changed:  See the new instructions.   insulin regular 100 units/mL injection Commonly known as:  NOVOLIN R RELION Inject subcutaneously daily as directed on the following sliding scale: 2 units for blood glucose reading equal to or greater than 150 and 1 unit for each blood glucose reading of 50.   Insulin Syringes (Disposable) U-100 1 ML Misc 1 each by Does not apply route daily.   irbesartan 300 MG tablet Commonly known as:  AVAPRO Take 300 mg by mouth at bedtime.   multivitamin Tabs tablet Take 1 tablet by mouth at bedtime.   polyethylene glycol packet Commonly known as:  MIRALAX / GLYCOLAX Take  17 g by mouth daily.   RELION INSULIN SYRINGE 1ML/31G 31G X 5/16" 1 ML Misc Generic drug:  Insulin Syringe-Needle U-100 1 EACH BY DOSE NOT APPLY ROUTE DAILY   sertraline 25 MG tablet Commonly known as:  ZOLOFT Take 1 tablet (25 mg total) by mouth daily. One half tablet a day for 10 days, then 1 a day   torsemide 100 MG tablet Commonly known as:  DEMADEX Take 100 mg by mouth daily.   traMADol 50 MG tablet Commonly known as:  ULTRAM Take 1 tablet (50 mg total) by mouth every 6 (six) hours as needed for moderate pain.   traZODone 50 MG tablet Commonly known as:  DESYREL Take 50 mg by mouth at bedtime. Dr Almond Lint [facility]  (From admission, onward)         Start     Ordered   07/21/18 1217  For home use only DME Walker rolling  Once    Question:  Patient needs a walker to treat with the following condition  Answer:  Weakness   07/21/18 1216  Diet and Activity recommendation: See Discharge Instructions above   Consults obtained -vascular surgery, ID, nephrology    Major procedures and Radiology Reports - PLEASE review detailed and final reports for all details, in brief -     No results found.  Micro Results     Recent Results (from the past 240 hour(s))  Body fluid culture     Status: None   Collection Time: 07/11/18  9:35 PM  Result Value Ref Range Status   Specimen Description FLUID PERITONEAL  Final   Special Requests NONE  Final   Gram Stain   Final    WBC PRESENT, PREDOMINANTLY PMN GRAM NEGATIVE RODS CYTOSPIN SMEAR Performed at [facility], [address] [place], [place] [address]    Culture FEW PSEUDOMONAS AERUGINOSA  Final   Report Status 07/15/2018 FINAL  Final   Organism ID, Bacteria PSEUDOMONAS AERUGINOSA  Final      Susceptibility   Pseudomonas aeruginosa - MIC*    CEFTAZIDIME <=1 SENSITIVE Sensitive     CIPROFLOXACIN <=0.25 SENSITIVE Sensitive     GENTAMICIN <=1 SENSITIVE Sensitive      IMIPENEM 2 SENSITIVE Sensitive     PIP/TAZO 8 SENSITIVE Sensitive     CEFEPIME <=1 SENSITIVE Sensitive     * FEW PSEUDOMONAS AERUGINOSA  Surgical PCR screen     Status: None   Collection Time: 07/17/18  8:24 AM  Result Value Ref Range Status   MRSA, PCR NEGATIVE NEGATIVE Final   Staphylococcus aureus NEGATIVE NEGATIVE Final    Comment: (NOTE) The Xpert SA Assay (FDA approved for NASAL specimens in patients 22 years of age and older), is one component of a comprehensive surveillance program. It is not intended to diagnose infection nor to guide or monitor treatment. Performed at [facility], [address], [place], [place] [address]   Anaerobic culture     Status: None (Preliminary result)   Collection Time: 07/17/18  9:53 AM  Result Value Ref Range Status   Specimen Description   Final    URINE, CATHETERIZED Performed at [facility], [address] [place], [place] [address]    Special Requests   Final    NONE Performed at [facility], [address] [place], [place] [address]    Culture   Final    NO ANAEROBES ISOLATED; CULTURE IN PROGRESS FOR 5 DAYS   Report Status PENDING  Incomplete  Aerobic Culture (superficial specimen)     Status: None   Collection Time: 07/17/18  9:53 AM  Result Value Ref Range Status   Specimen Description   Final    URINE, CATHETERIZED Performed at [facility], [address] [place], [place] [address]    Special Requests   Final    NONE Performed at [facility], [address] [place], [place] [address]    Gram Stain   Final    NO WBC SEEN NO ORGANISMS SEEN Performed at [facility], [place] [address], [place], [place] [address]    Culture FEW PSEUDOMONAS AERUGINOSA  Final   Report Status 07/19/2018 FINAL  Final   Organism ID, Bacteria PSEUDOMONAS AERUGINOSA  Final      Susceptibility   Pseudomonas aeruginosa - MIC*    CEFTAZIDIME <=1 SENSITIVE Sensitive     CIPROFLOXACIN  <=0.25 SENSITIVE Sensitive     GENTAMICIN <=1 SENSITIVE Sensitive     IMIPENEM 2 SENSITIVE Sensitive     PIP/TAZO 8 SENSITIVE Sensitive  CEFEPIME <=1 SENSITIVE Sensitive     * FEW PSEUDOMONAS AERUGINOSA  Cath Tip Culture     Status: Abnormal   Collection Time: 07/17/18  9:53 AM  Result Value Ref Range Status   Specimen Description CATH TIP PERITONEAL  Final   Special Requests   Final    NONE Performed at [facility], [place] [address] [place], [place] [address]    Culture >=100,000 COLONIES/mL PSEUDOMONAS AERUGINOSA (A)  Final   Report Status 07/19/2018 FINAL  Final   Organism ID, Bacteria PSEUDOMONAS AERUGINOSA (A)  Final      Susceptibility   Pseudomonas aeruginosa - MIC*    CEFTAZIDIME 2 SENSITIVE Sensitive     CIPROFLOXACIN <=0.25 SENSITIVE Sensitive     GENTAMICIN <=1 SENSITIVE Sensitive     IMIPENEM 2 SENSITIVE Sensitive     PIP/TAZO 8 SENSITIVE Sensitive     CEFEPIME <=1 SENSITIVE Sensitive     * >=100,000 COLONIES/mL PSEUDOMONAS AERUGINOSA       Today   Subjective:   Jake Gomez today has no headache,no chest abdominal pain,no new weakness tingling or numbness, feels much better wants to go home today.   Objective:   Blood pressure (!) 158/67, pulse 83, temperature 98.4 F (36.9 C), temperature source Oral, resp. rate 20, height 5\' 8"  (1.727 m), weight 120.8 kg, SpO2 96 %.   Intake/Output Summary (Last 24 hours) at 07/21/2018 1216 Last data filed at 07/20/2018 1254 Gross per 24 hour  Intake -  Output 533 ml  Net -533 ml    Exam Awake Alert, Oriented x 3, No new F.N deficits, Normal affect [place].AT,PERRAL Supple Neck,No JVD, No cervical lymphadenopathy appriciated.  Symmetrical Chest wall movement, Good air movement bilaterally, CTAB RRR,No Gallops,Rubs or new Murmurs, No Parasternal Heave +ve B.Sounds, Abd Soft, Non tender, No organomegaly appriciated, No rebound -guarding or rigidity. No Cyanosis, Clubbing or edema, No new Rash or bruise  Data  Review   CBC w Diff:  Lab Results  Component Value Date   WBC 11.4 (H) 07/20/2018   HGB 8.2 (L) 07/20/2018   HGB 11.5 (L) 04/30/2014   HCT 26.4 (L) 07/20/2018   HCT 33.9 (L) 04/30/2014   PLT 291 07/20/2018   PLT 238 04/30/2014   LYMPHOPCT 10 07/15/2018   MONOPCT 8 07/15/2018   EOSPCT 1 07/15/2018   BASOPCT 0 07/15/2018    CMP:  Lab Results  Component Value Date   NA 131 (L) 07/19/2018   NA 142 04/30/2014   K 4.3 07/19/2018   K 4.3 04/30/2014   CL 90 (L) 07/19/2018   CL 109 (H) 04/30/2014   CO2 25 07/19/2018   CO2 25 04/30/2014   BUN 84 (H) 07/19/2018   BUN 61 (H) 04/30/2014   CREATININE 14.13 (H) 07/19/2018   CREATININE 4.40 (H) 04/30/2014   PROT 7.9 07/10/2018   ALBUMIN 3.5 07/10/2018   ALBUMIN 3.3 (L) 04/15/2014   BILITOT 0.8 07/10/2018   ALKPHOS 206 (H) 07/10/2018   AST 35 07/10/2018   ALT 56 (H) 07/10/2018  .   Total Time in preparing paper work, data evaluation and todays exam - 48 minutes  Epifanio Lesches M.D on 07/21/2018 at 12:16 PM    Note: This dictation was prepared with Dragon dictation along with smaller phrase technology. Any transcriptional errors that result from this process are unintentional.

## 2018-07-21 NOTE — Progress Notes (Signed)
Pre HD Assessment    07/21/18 1416  Neurological  Level of Consciousness Alert  Respiratory  Respiratory Pattern Regular;Unlabored  Chest Assessment Chest expansion symmetrical  Cardiac  Pulse Regular  ECG Monitor Yes  Cardiac Rhythm NSR  Vascular  R Radial Pulse +2  L Radial Pulse +2  Edema Generalized  Integumentary  Integumentary (WDL) X  Skin Color Appropriate for ethnicity  Skin Integrity Catheter entry/exit site  Musculoskeletal  Musculoskeletal (WDL) X  Generalized Weakness Yes  Assistive Device None  GU Assessment  Genitourinary (WDL) X  Genitourinary Symptoms  (HD)  Psychosocial  Psychosocial (WDL) WDL

## 2018-07-21 NOTE — Progress Notes (Signed)
HD Tx End   07/21/18 1812  Vital Signs  Pulse Rate 77  Pulse Rate Source Monitor  Resp 20  BP (!) 188/68  BP Location Right Arm  BP Method Automatic  Patient Position (if appropriate) Sitting  Oxygen Therapy  SpO2 96 %  O2 Device Room Air  During Hemodialysis Assessment  Blood Flow Rate (mL/min) 200 mL/min  Arterial Pressure (mmHg) -180 mmHg  Venous Pressure (mmHg) 80 mmHg  Transmembrane Pressure (mmHg) 40 mmHg  Ultrafiltration Rate (mL/min) 570 mL/min  Dialysate Flow Rate (mL/min) 800 ml/min  Conductivity: Machine  14.1  HD Safety Checks Performed Yes  KECN 78.5 KECN  Dialysis Fluid Bolus Normal Saline  Bolus Amount (mL) 250 mL  Intra-Hemodialysis Comments Tx completed

## 2018-07-22 LAB — ANAEROBIC CULTURE

## 2018-07-22 LAB — GLUCOSE, CAPILLARY
Glucose-Capillary: 114 mg/dL — ABNORMAL HIGH (ref 70–99)
Glucose-Capillary: 132 mg/dL — ABNORMAL HIGH (ref 70–99)

## 2018-07-22 MED ORDER — CARVEDILOL 12.5 MG PO TABS: 12.5000 mg | ORAL_TABLET | Freq: Two times a day (BID) | ORAL | Status: DC

## 2018-07-22 MED ORDER — AMLODIPINE BESYLATE 10 MG PO TABS: 10.0000 mg | ORAL_TABLET | Freq: Every day | ORAL | 0 refills | Status: AC

## 2018-07-22 MED ORDER — ISOSORBIDE MONONITRATE ER 30 MG PO TB24: 30.0000 mg | ORAL_TABLET | Freq: Every day | ORAL | 0 refills | Status: DC

## 2018-07-22 MED ORDER — HYDRALAZINE HCL 20 MG/ML IJ SOLN
10.0000 mg | Freq: Once | INTRAMUSCULAR | Status: AC
  Administered 2018-07-22: 10 mg via INTRAVENOUS
  Filled 2018-07-22: qty 1

## 2018-07-22 MED ORDER — ISOSORBIDE MONONITRATE ER 30 MG PO TB24
30.0000 mg | ORAL_TABLET | Freq: Every day | ORAL | Status: DC
  Administered 2018-07-22: 30 mg via ORAL
  Filled 2018-07-22: qty 1

## 2018-07-22 MED ORDER — CARVEDILOL 6.25 MG PO TABS
6.2500 mg | ORAL_TABLET | Freq: Once | ORAL | Status: AC
  Administered 2018-07-22: 6.25 mg via ORAL
  Filled 2018-07-22: qty 1

## 2018-07-22 MED ORDER — CARVEDILOL 12.5 MG PO TABS: 12.5000 mg | ORAL_TABLET | Freq: Two times a day (BID) | ORAL | 0 refills | Status: DC

## 2018-07-22 NOTE — Discharge Summary (Signed)
[facility] NAME: Jake Gomez    MR#:  [id number]  DATE OF BIRTH:  1970/12/11  DATE OF ADMISSION:  07/10/2018   ADMITTING PHYSICIAN: Harrie Foreman, MD  DATE OF DISCHARGE: 07/22/2018  PRIMARY CARE PHYSICIAN: Juline Patch, MD   ADMISSION DIAGNOSIS:  Spontaneous bacterial peritonitis ([place]) [K65.2] DISCHARGE DIAGNOSIS:  Active Problems:   Sepsis ([place])  SECONDARY DIAGNOSIS:   Past Medical History:  Diagnosis Date  . Diabetes mellitus without complication ([place])   . Hypertension   . Renal disorder    HOSPITAL COURSE:   Chief complaint; abdominal pains  History of presenting complaint; Patient is a 48 year old male with history of end-stage renal disease on peritoneal dialysis, hypertension and diabetes mellitus who presented to the emergency room with complaints of abdominal pains.  Patient admitted to having nonbloody diarrhea.  In the emergency room was found to meet criteria for sepsis.  Was started on broad-spectrum IV antibiotics and admitted to the medical service.  Please refer to the H&P dictated for further details.  Hospital course; 1 Abdominal pain secondary to recurrent peritonitis, peritoneal fluid at this time also showed Pseudomonas, patient had history of Pseudomonas infection from peritoneal fluid 2 times before.  Peritoneal dialysis catheter removed by vascular on February 3 because of infection.  Patient now has permacath in right chest, using with that for dialysis.  Patient will be on 4 weeks of IV antibiotics with Fortaz, 4 weeks from catheter removed, this can be given with dialysis.  Initially patient had severe abdominal pain, leukocytosis, fever all that is better now, patient abdominal pain also improved, not requiring IV morphine, discharged home with a limited supply of tramadol.  2.  ESRD: Hemodialysis, patient will get hemodialysis on Tuesday, Thursday, Saturday, skin is secured outpatient  spot.  Patient is converted to hemodialysis on this admission he was on peritoneal dialysis before, peritoneal Lasix catheter removed at this time because of repeat infection. Nephrologist already made arrangements for patient to complete IV antibiotics with Tressie Ellis during hemodialysis sessions as outpatient.  3.  Diabetes mellitus type 2 with ESRD.;  Continue glipizide 5 mg p.o. daily at home along with sliding scale insulin.  4.  Hypertensive urgency Discharge was initially scheduled for yesterday.  This had to be canceled due to elevated blood pressure.  Over the last 24 hours, changes were made on blood pressure medications for better blood pressure control.  Dose of Norvasc increased from 5 to 10 mg.  Dose of Coreg increased from 6.25 to 12.5 mg p.o. twice daily.  Was also started on Imdur 30 mg p.o. daily.  Blood pressure controlled improved but not yet optimal.  To follow-up with primary care physician in 3 days to recheck blood pressure and adjust meds as needed.  Patient remains asymptomatic.  Disposition; patient clinically and hemodynamically stable for discharge.  Follow-up with primary care physician.  DISCHARGE CONDITIONS:  Stable CONSULTS OBTAINED:   DRUG ALLERGIES:  No Known Allergies DISCHARGE MEDICATIONS:   Allergies as of 07/22/2018   No Known Allergies     Medication List    STOP taking these medications   acetaminophen 500 MG tablet Commonly known as:  TYLENOL     TAKE these medications   amLODipine 10 MG tablet Commonly known as:  NORVASC Take 1 tablet (10 mg total) by mouth daily. Start taking on:  July 23, 2018 What changed:    medication strength  how much to  take   atorvastatin 80 MG tablet Commonly known as:  LIPITOR Take 1 tablet (80 mg total) by mouth every morning.   calcium acetate 667 MG capsule Commonly known as:  PHOSLO Take 2,001 mg by mouth 3 (three) times daily with meals.   carvedilol 12.5 MG tablet Commonly known as:  COREG Take  1 tablet (12.5 mg total) by mouth 2 (two) times daily with a meal. What changed:    medication strength  how much to take   cefTAZidime 2 g in sodium chloride 0.9 % 100 mL Inject 2 g into the vein every dialysis. As per Dr. Steva Ready patient needs 4 weeks of antibiotics starting February 3rd with dialysis   cyanocobalamin 1000 MCG tablet Take 1 tablet (1,000 mcg total) by mouth daily.   docusate sodium 100 MG capsule Commonly known as:  COLACE Take 1 capsule (100 mg total) by mouth 2 (two) times daily.   feeding supplement (NEPRO CARB STEADY) Liqd Take 237 mLs by mouth 2 (two) times daily between meals.   fluticasone 50 MCG/ACT nasal spray Commonly known as:  FLONASE Place 2 sprays into both nostrils daily.   folic acid 1 MG tablet Commonly known as:  FOLVITE Take 1 tablet (1 mg total) by mouth daily.   gabapentin 300 MG capsule Commonly known as:  NEURONTIN Take 300 mg by mouth daily.   glipiZIDE 5 MG tablet Commonly known as:  GLUCOTROL TAKE 1 TABLET BY MOUTH ONCE DAILY BEFORE BREAKFAST What changed:  See the new instructions.   insulin regular 100 units/mL injection Commonly known as:  NOVOLIN R RELION Inject subcutaneously daily as directed on the following sliding scale: 2 units for blood glucose reading equal to or greater than 150 and 1 unit for each blood glucose reading of 50.   Insulin Syringes (Disposable) U-100 1 ML Misc 1 each by Does not apply route daily.   irbesartan 300 MG tablet Commonly known as:  AVAPRO Take 300 mg by mouth at bedtime.   isosorbide mononitrate 30 MG 24 hr tablet Commonly known as:  IMDUR Take 1 tablet (30 mg total) by mouth daily. Start taking on:  July 23, 2018   multivitamin Tabs tablet Take 1 tablet by mouth at bedtime.   polyethylene glycol packet Commonly known as:  MIRALAX / GLYCOLAX Take 17 g by mouth daily.   RELION INSULIN SYRINGE 1ML/31G 31G X 5/16" 1 ML Misc Generic drug:  Insulin Syringe-Needle U-100 1  EACH BY DOSE NOT APPLY ROUTE DAILY   sertraline 25 MG tablet Commonly known as:  ZOLOFT Take 1 tablet (25 mg total) by mouth daily. One half tablet a day for 10 days, then 1 a day   torsemide 100 MG tablet Commonly known as:  DEMADEX Take 100 mg by mouth daily.   traMADol 50 MG tablet Commonly known as:  ULTRAM Take 1 tablet (50 mg total) by mouth every 6 (six) hours as needed for moderate pain. Notes to patient:  Last dose given today at 8:30am   traZODone 50 MG tablet Commonly known as:  DESYREL Take 50 mg by mouth at bedtime. Dr Almond [facility]  (From admission, onward)         Start     Ordered   07/21/18 1217  For home use only DME Walker rolling  Once    Question:  Patient needs a walker to treat with the following condition  Answer:  Weakness   07/21/18 1216           DISCHARGE INSTRUCTIONS:   DIET:  Renal diet DISCHARGE CONDITION:  Stable ACTIVITY:  Activity as tolerated OXYGEN:  Home Oxygen: No.  Oxygen Delivery: room air DISCHARGE LOCATION:  home   If you experience worsening of your admission symptoms, develop shortness of breath, life threatening emergency, suicidal or homicidal thoughts you must seek medical attention immediately by calling 911 or calling your MD immediately  if symptoms less severe.  You Must read complete instructions/literature along with all the possible adverse reactions/side effects for all the Medicines you take and that have been prescribed to you. Take any new Medicines after you have completely understood and accpet all the possible adverse reactions/side effects.   Please note  You were cared for by a hospitalist during your hospital stay. If you have any questions about your discharge medications or the care you received while you were in the hospital after you are discharged, you can call the unit and asked to speak with the hospitalist on call if the hospitalist that took care of you is  not available. Once you are discharged, your primary care physician will handle any further medical issues. Please note that NO REFILLS for any discharge medications will be authorized once you are discharged, as it is imperative that you return to your primary care physician (or establish a relationship with a primary care physician if you do not have one) for your aftercare needs so that they can reassess your need for medications and monitor your lab values.    On the day of Discharge:  VITAL SIGNS:  Blood pressure (!) 160/69, pulse 81, temperature 98.4 F (36.9 C), temperature source Oral, resp. rate 20, height 5\' 8"  (1.727 m), weight 113 kg, SpO2 96 %. PHYSICAL EXAMINATION:  GENERAL:  48 y.o.-year-old patient lying in the bed with no acute distress.  EYES: Pupils equal, round, reactive to light and accommodation. No scleral icterus. Extraocular muscles intact.  HEENT: Head atraumatic, normocephalic. Oropharynx and nasopharynx clear.  NECK:  Supple, no jugular venous distention. No thyroid enlargement, no tenderness.  LUNGS: Normal breath sounds bilaterally, no wheezing, rales,rhonchi or crepitation. No use of accessory muscles of respiration.  CARDIOVASCULAR: S1, S2 normal. No murmurs, rubs, or gallops.  ABDOMEN: Soft, non-tender, non-distended. Bowel sounds present. No organomegaly or mass.  EXTREMITIES: No pedal edema, cyanosis, or clubbing.  NEUROLOGIC: Cranial nerves II through XII are intact. Muscle strength 5/5 in all extremities. Sensation intact. Gait not checked.  PSYCHIATRIC: The patient is alert and oriented x 3.  SKIN: No obvious rash, lesion, or ulcer.  DATA REVIEW:   CBC Recent Labs  Lab 07/20/18 0302  WBC 11.4*  HGB 8.2*  HCT 26.4*  PLT 291    Chemistries  Recent Labs  Lab 07/19/18 0459  NA 131*  K 4.3  CL 90*  CO2 25  GLUCOSE 142*  BUN 84*  CREATININE 14.13*  CALCIUM 9.7  MG 2.4     Microbiology Results  Results for orders placed or performed  during the hospital encounter of 07/10/18  Body fluid culture     Status: None   Collection Time: 07/11/18  9:35 PM  Result Value Ref Range Status   Specimen Description FLUID PERITONEAL  Final   Special Requests NONE  Final   Gram Stain   Final    WBC PRESENT, PREDOMINANTLY PMN GRAM NEGATIVE RODS CYTOSPIN SMEAR Performed at [facility], [place]  [address], [place], [place] [address]    Culture FEW PSEUDOMONAS AERUGINOSA  Final   Report Status 07/15/2018 FINAL  Final   Organism ID, Bacteria PSEUDOMONAS AERUGINOSA  Final      Susceptibility   Pseudomonas aeruginosa - MIC*    CEFTAZIDIME <=1 SENSITIVE Sensitive     CIPROFLOXACIN <=0.25 SENSITIVE Sensitive     GENTAMICIN <=1 SENSITIVE Sensitive     IMIPENEM 2 SENSITIVE Sensitive     PIP/TAZO 8 SENSITIVE Sensitive     CEFEPIME <=1 SENSITIVE Sensitive     * FEW PSEUDOMONAS AERUGINOSA  Surgical PCR screen     Status: None   Collection Time: 07/17/18  8:24 AM  Result Value Ref Range Status   MRSA, PCR NEGATIVE NEGATIVE Final   Staphylococcus aureus NEGATIVE NEGATIVE Final    Comment: (NOTE) The Xpert SA Assay (FDA approved for NASAL specimens in patients 77 years of age and older), is one component of a comprehensive surveillance program. It is not intended to diagnose infection nor to guide or monitor treatment. Performed at [facility], [address], [place], [place] [address]   Anaerobic culture     Status: None   Collection Time: 07/17/18  9:53 AM  Result Value Ref Range Status   Specimen Description   Final    URINE, CATHETERIZED Performed at [facility], [address] [place], [place] [address]    Special Requests   Final    NONE Performed at [facility], [address], [place], [place] [address]    Culture   Final    NO ANAEROBES ISOLATED Performed at [facility], [place] [address] [place], [place] [address]    Report Status 07/22/2018 FINAL  Final  Aerobic Culture  (superficial specimen)     Status: None   Collection Time: 07/17/18  9:53 AM  Result Value Ref Range Status   Specimen Description   Final    URINE, CATHETERIZED Performed at [facility], [address], [place], [place] [address]    Special Requests   Final    NONE Performed at [facility], [place] [place], [place] [address]    Gram Stain   Final    NO WBC SEEN NO ORGANISMS SEEN Performed at [facility], [place] [address] [place], [place] [address]    Culture FEW PSEUDOMONAS AERUGINOSA  Final   Report Status 07/19/2018 FINAL  Final   Organism ID, Bacteria PSEUDOMONAS AERUGINOSA  Final      Susceptibility   Pseudomonas aeruginosa - MIC*    CEFTAZIDIME <=1 SENSITIVE Sensitive     CIPROFLOXACIN <=0.25 SENSITIVE Sensitive     GENTAMICIN <=1 SENSITIVE Sensitive     IMIPENEM 2 SENSITIVE Sensitive     PIP/TAZO 8 SENSITIVE Sensitive     CEFEPIME <=1 SENSITIVE Sensitive     * FEW PSEUDOMONAS AERUGINOSA  Cath Tip Culture     Status: Abnormal   Collection Time: 07/17/18  9:53 AM  Result Value Ref Range Status   Specimen Description CATH TIP PERITONEAL  Final   Special Requests   Final    NONE Performed at [facility], [place] [address] [place], [place] [address]    Culture >=100,000 COLONIES/mL PSEUDOMONAS AERUGINOSA (A)  Final   Report Status 07/19/2018 FINAL  Final   Organism ID, Bacteria PSEUDOMONAS AERUGINOSA (A)  Final      Susceptibility   Pseudomonas aeruginosa - MIC*    CEFTAZIDIME 2 SENSITIVE Sensitive  CIPROFLOXACIN <=0.25 SENSITIVE Sensitive     GENTAMICIN <=1 SENSITIVE Sensitive     IMIPENEM 2 SENSITIVE Sensitive     PIP/TAZO 8 SENSITIVE Sensitive     CEFEPIME <=1 SENSITIVE Sensitive     * >=100,000 COLONIES/mL PSEUDOMONAS AERUGINOSA    RADIOLOGY:  No results found.   Management plans discussed with the patient, family and they are in agreement.  CODE STATUS: Full Code   TOTAL TIME TAKING CARE OF THIS PATIENT: 36  minutes.    Jake Gomez M.D on 07/22/2018 at 3:37 PM  Between 7am to 6pm - Pager - [phone]  After 6pm go to [email or web address] - Proofreader  [facility]  Office  [phone]  CC: Primary care physician; Juline Patch, MD   Note: This dictation was prepared with Dragon dictation along with smaller phrase technology. Any transcriptional errors that result from this process are unintentional.

## 2018-07-24 ENCOUNTER — Telehealth: Payer: Self-pay

## 2018-07-24 NOTE — Telephone Encounter (Signed)
Transition Care Management Follow-up Telephone Call  Date of discharge and from where: 07/22/18 [facility]  How have you been since you were released from the hospital? Pt states he has been doing pretty good with BP reading at home 141/76.   Any questions or concerns? No  pt is scheduled to start dialysis tomorrow.   Items Reviewed:  Did the pt receive and understand the discharge instructions provided? Yes   Medications obtained and verified? Yes   Any new allergies since your discharge? No   Dietary orders reviewed? Yes  Do you have support at home? Yes    Functional Questionnaire: (I = Independent and D = Dependent) ADLs: I  Bathing/Dressing- I  Meal Prep- I  Eating- I  Maintaining continence- I  Transferring/Ambulation- I  Managing Meds- I  Follow up appointments reviewed:   PCP Hospital f/u appt confirmed? No  Pt starting dialysis tomorrow and will have BP checked, requested to ask Dr. Ronnald Ramp if follow up appt necessary.   Are transportation arrangements needed? No   If their condition worsens, is the pt aware to call PCP or go to the Emergency Dept.? Yes  Was the patient provided with contact information for the PCP's office or ED? Yes  Was to pt encouraged to call back with questions or concerns? Yes

## 2018-07-25 ENCOUNTER — Other Ambulatory Visit (INDEPENDENT_AMBULATORY_CARE_PROVIDER_SITE_OTHER): Payer: Self-pay | Admitting: Vascular Surgery

## 2018-07-25 DIAGNOSIS — D509 Iron deficiency anemia, unspecified: Secondary | ICD-10-CM | POA: Diagnosis not present

## 2018-07-25 DIAGNOSIS — Z298 Encounter for other specified prophylactic measures: Secondary | ICD-10-CM | POA: Diagnosis not present

## 2018-07-25 DIAGNOSIS — N186 End stage renal disease: Secondary | ICD-10-CM

## 2018-07-25 DIAGNOSIS — Z992 Dependence on renal dialysis: Secondary | ICD-10-CM | POA: Diagnosis not present

## 2018-07-25 DIAGNOSIS — N2581 Secondary hyperparathyroidism of renal origin: Secondary | ICD-10-CM | POA: Diagnosis not present

## 2018-07-25 DIAGNOSIS — D631 Anemia in chronic kidney disease: Secondary | ICD-10-CM | POA: Diagnosis not present

## 2018-07-26 ENCOUNTER — Encounter (INDEPENDENT_AMBULATORY_CARE_PROVIDER_SITE_OTHER): Payer: Self-pay

## 2018-07-26 ENCOUNTER — Ambulatory Visit (INDEPENDENT_AMBULATORY_CARE_PROVIDER_SITE_OTHER): Payer: Medicare Other | Admitting: Vascular Surgery

## 2018-07-26 ENCOUNTER — Ambulatory Visit (INDEPENDENT_AMBULATORY_CARE_PROVIDER_SITE_OTHER): Payer: Medicare Other

## 2018-07-26 DIAGNOSIS — N186 End stage renal disease: Secondary | ICD-10-CM

## 2018-07-26 DIAGNOSIS — Z992 Dependence on renal dialysis: Secondary | ICD-10-CM | POA: Diagnosis not present

## 2018-07-27 ENCOUNTER — Telehealth (INDEPENDENT_AMBULATORY_CARE_PROVIDER_SITE_OTHER): Payer: Self-pay | Admitting: Vascular Surgery

## 2018-07-27 DIAGNOSIS — D509 Iron deficiency anemia, unspecified: Secondary | ICD-10-CM | POA: Diagnosis not present

## 2018-07-27 DIAGNOSIS — Z298 Encounter for other specified prophylactic measures: Secondary | ICD-10-CM | POA: Diagnosis not present

## 2018-07-27 DIAGNOSIS — Z992 Dependence on renal dialysis: Secondary | ICD-10-CM | POA: Diagnosis not present

## 2018-07-27 DIAGNOSIS — N2581 Secondary hyperparathyroidism of renal origin: Secondary | ICD-10-CM | POA: Diagnosis not present

## 2018-07-27 DIAGNOSIS — D631 Anemia in chronic kidney disease: Secondary | ICD-10-CM | POA: Diagnosis not present

## 2018-07-27 DIAGNOSIS — N186 End stage renal disease: Secondary | ICD-10-CM | POA: Diagnosis not present

## 2018-07-27 NOTE — Telephone Encounter (Signed)
Attempted to call the patient to review his upper extremity vein mapping results. The patient did not have a mailbox set up to leave a message. His vein mapping is amenable to creation of a left brachiocephalic AV fistula. I will go head and place the patient on Dr. Bunnie Domino schedule for a left upper extremity brachiocephalic AV fistula creation for ESRD. I will try to call the patient again.

## 2018-07-29 DIAGNOSIS — N186 End stage renal disease: Secondary | ICD-10-CM | POA: Diagnosis not present

## 2018-07-29 DIAGNOSIS — Z992 Dependence on renal dialysis: Secondary | ICD-10-CM | POA: Diagnosis not present

## 2018-07-29 DIAGNOSIS — N2581 Secondary hyperparathyroidism of renal origin: Secondary | ICD-10-CM | POA: Diagnosis not present

## 2018-07-29 DIAGNOSIS — D509 Iron deficiency anemia, unspecified: Secondary | ICD-10-CM | POA: Diagnosis not present

## 2018-07-29 DIAGNOSIS — Z298 Encounter for other specified prophylactic measures: Secondary | ICD-10-CM | POA: Diagnosis not present

## 2018-07-29 DIAGNOSIS — D631 Anemia in chronic kidney disease: Secondary | ICD-10-CM | POA: Diagnosis not present

## 2018-08-01 DIAGNOSIS — N2581 Secondary hyperparathyroidism of renal origin: Secondary | ICD-10-CM | POA: Diagnosis not present

## 2018-08-01 DIAGNOSIS — N186 End stage renal disease: Secondary | ICD-10-CM | POA: Diagnosis not present

## 2018-08-01 DIAGNOSIS — Z992 Dependence on renal dialysis: Secondary | ICD-10-CM | POA: Diagnosis not present

## 2018-08-01 DIAGNOSIS — Z298 Encounter for other specified prophylactic measures: Secondary | ICD-10-CM | POA: Diagnosis not present

## 2018-08-01 DIAGNOSIS — D631 Anemia in chronic kidney disease: Secondary | ICD-10-CM | POA: Diagnosis not present

## 2018-08-01 DIAGNOSIS — D509 Iron deficiency anemia, unspecified: Secondary | ICD-10-CM | POA: Diagnosis not present

## 2018-08-02 DIAGNOSIS — E119 Type 2 diabetes mellitus without complications: Secondary | ICD-10-CM | POA: Diagnosis not present

## 2018-08-02 DIAGNOSIS — Z87891 Personal history of nicotine dependence: Secondary | ICD-10-CM | POA: Diagnosis not present

## 2018-08-02 DIAGNOSIS — M545 Low back pain: Secondary | ICD-10-CM | POA: Diagnosis not present

## 2018-08-02 DIAGNOSIS — Z833 Family history of diabetes mellitus: Secondary | ICD-10-CM | POA: Diagnosis not present

## 2018-08-02 DIAGNOSIS — Z823 Family history of stroke: Secondary | ICD-10-CM | POA: Diagnosis not present

## 2018-08-02 DIAGNOSIS — N186 End stage renal disease: Secondary | ICD-10-CM | POA: Diagnosis not present

## 2018-08-02 DIAGNOSIS — R1084 Generalized abdominal pain: Secondary | ICD-10-CM | POA: Diagnosis not present

## 2018-08-02 DIAGNOSIS — K5900 Constipation, unspecified: Secondary | ICD-10-CM | POA: Diagnosis not present

## 2018-08-02 DIAGNOSIS — Z992 Dependence on renal dialysis: Secondary | ICD-10-CM | POA: Diagnosis not present

## 2018-08-02 DIAGNOSIS — Z8249 Family history of ischemic heart disease and other diseases of the circulatory system: Secondary | ICD-10-CM | POA: Diagnosis not present

## 2018-08-02 DIAGNOSIS — E785 Hyperlipidemia, unspecified: Secondary | ICD-10-CM | POA: Diagnosis not present

## 2018-08-02 DIAGNOSIS — R197 Diarrhea, unspecified: Secondary | ICD-10-CM | POA: Diagnosis not present

## 2018-08-02 DIAGNOSIS — I120 Hypertensive chronic kidney disease with stage 5 chronic kidney disease or end stage renal disease: Secondary | ICD-10-CM | POA: Diagnosis not present

## 2018-08-02 DIAGNOSIS — R1031 Right lower quadrant pain: Secondary | ICD-10-CM | POA: Diagnosis not present

## 2018-08-03 DIAGNOSIS — Z992 Dependence on renal dialysis: Secondary | ICD-10-CM | POA: Diagnosis not present

## 2018-08-03 DIAGNOSIS — Z298 Encounter for other specified prophylactic measures: Secondary | ICD-10-CM | POA: Diagnosis not present

## 2018-08-03 DIAGNOSIS — N186 End stage renal disease: Secondary | ICD-10-CM | POA: Diagnosis not present

## 2018-08-03 DIAGNOSIS — N2581 Secondary hyperparathyroidism of renal origin: Secondary | ICD-10-CM | POA: Diagnosis not present

## 2018-08-03 DIAGNOSIS — D509 Iron deficiency anemia, unspecified: Secondary | ICD-10-CM | POA: Diagnosis not present

## 2018-08-03 DIAGNOSIS — D631 Anemia in chronic kidney disease: Secondary | ICD-10-CM | POA: Diagnosis not present

## 2018-08-05 DIAGNOSIS — Z992 Dependence on renal dialysis: Secondary | ICD-10-CM | POA: Diagnosis not present

## 2018-08-05 DIAGNOSIS — Z298 Encounter for other specified prophylactic measures: Secondary | ICD-10-CM | POA: Diagnosis not present

## 2018-08-05 DIAGNOSIS — N186 End stage renal disease: Secondary | ICD-10-CM | POA: Diagnosis not present

## 2018-08-05 DIAGNOSIS — N2581 Secondary hyperparathyroidism of renal origin: Secondary | ICD-10-CM | POA: Diagnosis not present

## 2018-08-05 DIAGNOSIS — D631 Anemia in chronic kidney disease: Secondary | ICD-10-CM | POA: Diagnosis not present

## 2018-08-05 DIAGNOSIS — D509 Iron deficiency anemia, unspecified: Secondary | ICD-10-CM | POA: Diagnosis not present

## 2018-08-08 DIAGNOSIS — N186 End stage renal disease: Secondary | ICD-10-CM | POA: Diagnosis not present

## 2018-08-08 DIAGNOSIS — D631 Anemia in chronic kidney disease: Secondary | ICD-10-CM | POA: Diagnosis not present

## 2018-08-08 DIAGNOSIS — N2581 Secondary hyperparathyroidism of renal origin: Secondary | ICD-10-CM | POA: Diagnosis not present

## 2018-08-08 DIAGNOSIS — Z992 Dependence on renal dialysis: Secondary | ICD-10-CM | POA: Diagnosis not present

## 2018-08-08 DIAGNOSIS — D509 Iron deficiency anemia, unspecified: Secondary | ICD-10-CM | POA: Diagnosis not present

## 2018-08-08 DIAGNOSIS — Z298 Encounter for other specified prophylactic measures: Secondary | ICD-10-CM | POA: Diagnosis not present

## 2018-08-10 DIAGNOSIS — N2581 Secondary hyperparathyroidism of renal origin: Secondary | ICD-10-CM | POA: Diagnosis not present

## 2018-08-10 DIAGNOSIS — D509 Iron deficiency anemia, unspecified: Secondary | ICD-10-CM | POA: Diagnosis not present

## 2018-08-10 DIAGNOSIS — D631 Anemia in chronic kidney disease: Secondary | ICD-10-CM | POA: Diagnosis not present

## 2018-08-10 DIAGNOSIS — N186 End stage renal disease: Secondary | ICD-10-CM | POA: Diagnosis not present

## 2018-08-10 DIAGNOSIS — Z992 Dependence on renal dialysis: Secondary | ICD-10-CM | POA: Diagnosis not present

## 2018-08-10 DIAGNOSIS — Z298 Encounter for other specified prophylactic measures: Secondary | ICD-10-CM | POA: Diagnosis not present

## 2018-08-12 DIAGNOSIS — D509 Iron deficiency anemia, unspecified: Secondary | ICD-10-CM | POA: Diagnosis not present

## 2018-08-12 DIAGNOSIS — Z298 Encounter for other specified prophylactic measures: Secondary | ICD-10-CM | POA: Diagnosis not present

## 2018-08-12 DIAGNOSIS — N186 End stage renal disease: Secondary | ICD-10-CM | POA: Diagnosis not present

## 2018-08-12 DIAGNOSIS — N2581 Secondary hyperparathyroidism of renal origin: Secondary | ICD-10-CM | POA: Diagnosis not present

## 2018-08-12 DIAGNOSIS — D631 Anemia in chronic kidney disease: Secondary | ICD-10-CM | POA: Diagnosis not present

## 2018-08-12 DIAGNOSIS — Z992 Dependence on renal dialysis: Secondary | ICD-10-CM | POA: Diagnosis not present

## 2018-08-15 DIAGNOSIS — N2581 Secondary hyperparathyroidism of renal origin: Secondary | ICD-10-CM | POA: Diagnosis not present

## 2018-08-15 DIAGNOSIS — Z992 Dependence on renal dialysis: Secondary | ICD-10-CM | POA: Diagnosis not present

## 2018-08-15 DIAGNOSIS — D509 Iron deficiency anemia, unspecified: Secondary | ICD-10-CM | POA: Diagnosis not present

## 2018-08-15 DIAGNOSIS — Z298 Encounter for other specified prophylactic measures: Secondary | ICD-10-CM | POA: Diagnosis not present

## 2018-08-15 DIAGNOSIS — D631 Anemia in chronic kidney disease: Secondary | ICD-10-CM | POA: Diagnosis not present

## 2018-08-15 DIAGNOSIS — N186 End stage renal disease: Secondary | ICD-10-CM | POA: Diagnosis not present

## 2018-08-16 ENCOUNTER — Ambulatory Visit
Admission: RE | Admit: 2018-08-16 | Discharge: 2018-08-16 | Disposition: A | Discharge status: Home / Self Care | Payer: Medicare Other | Source: Ambulatory Visit | Attending: Nephrology | Admitting: Nephrology

## 2018-08-16 ENCOUNTER — Other Ambulatory Visit: Payer: Self-pay

## 2018-08-16 ENCOUNTER — Other Ambulatory Visit: Payer: Self-pay | Admitting: Nephrology

## 2018-08-16 ENCOUNTER — Ambulatory Visit
Admission: RE | Admit: 2018-08-16 | Discharge: 2018-08-16 | Disposition: A | Discharge status: Home / Self Care | Payer: Medicare Other | Attending: Nephrology | Admitting: Nephrology

## 2018-08-16 DIAGNOSIS — R0602 Shortness of breath: Secondary | ICD-10-CM | POA: Diagnosis not present

## 2018-08-16 DIAGNOSIS — Z992 Dependence on renal dialysis: Secondary | ICD-10-CM | POA: Diagnosis not present

## 2018-08-16 DIAGNOSIS — R17 Unspecified jaundice: Secondary | ICD-10-CM | POA: Diagnosis not present

## 2018-08-16 DIAGNOSIS — N186 End stage renal disease: Secondary | ICD-10-CM | POA: Diagnosis not present

## 2018-08-16 DIAGNOSIS — E8770 Fluid overload, unspecified: Secondary | ICD-10-CM | POA: Diagnosis not present

## 2018-08-17 DIAGNOSIS — D631 Anemia in chronic kidney disease: Secondary | ICD-10-CM | POA: Diagnosis not present

## 2018-08-17 DIAGNOSIS — N186 End stage renal disease: Secondary | ICD-10-CM | POA: Diagnosis not present

## 2018-08-17 DIAGNOSIS — Z992 Dependence on renal dialysis: Secondary | ICD-10-CM | POA: Diagnosis not present

## 2018-08-17 DIAGNOSIS — N2581 Secondary hyperparathyroidism of renal origin: Secondary | ICD-10-CM | POA: Diagnosis not present

## 2018-08-17 DIAGNOSIS — D509 Iron deficiency anemia, unspecified: Secondary | ICD-10-CM | POA: Diagnosis not present

## 2018-08-17 DIAGNOSIS — Z298 Encounter for other specified prophylactic measures: Secondary | ICD-10-CM | POA: Diagnosis not present

## 2018-08-19 DIAGNOSIS — Z298 Encounter for other specified prophylactic measures: Secondary | ICD-10-CM | POA: Diagnosis not present

## 2018-08-19 DIAGNOSIS — N186 End stage renal disease: Secondary | ICD-10-CM | POA: Diagnosis not present

## 2018-08-19 DIAGNOSIS — D509 Iron deficiency anemia, unspecified: Secondary | ICD-10-CM | POA: Diagnosis not present

## 2018-08-19 DIAGNOSIS — Z992 Dependence on renal dialysis: Secondary | ICD-10-CM | POA: Diagnosis not present

## 2018-08-19 DIAGNOSIS — D631 Anemia in chronic kidney disease: Secondary | ICD-10-CM | POA: Diagnosis not present

## 2018-08-19 DIAGNOSIS — N2581 Secondary hyperparathyroidism of renal origin: Secondary | ICD-10-CM | POA: Diagnosis not present

## 2018-08-22 DIAGNOSIS — D631 Anemia in chronic kidney disease: Secondary | ICD-10-CM | POA: Diagnosis not present

## 2018-08-22 DIAGNOSIS — Z992 Dependence on renal dialysis: Secondary | ICD-10-CM | POA: Diagnosis not present

## 2018-08-22 DIAGNOSIS — N2581 Secondary hyperparathyroidism of renal origin: Secondary | ICD-10-CM | POA: Diagnosis not present

## 2018-08-22 DIAGNOSIS — Z298 Encounter for other specified prophylactic measures: Secondary | ICD-10-CM | POA: Diagnosis not present

## 2018-08-22 DIAGNOSIS — N186 End stage renal disease: Secondary | ICD-10-CM | POA: Diagnosis not present

## 2018-08-22 DIAGNOSIS — D509 Iron deficiency anemia, unspecified: Secondary | ICD-10-CM | POA: Diagnosis not present

## 2018-08-24 DIAGNOSIS — D509 Iron deficiency anemia, unspecified: Secondary | ICD-10-CM | POA: Diagnosis not present

## 2018-08-24 DIAGNOSIS — Z992 Dependence on renal dialysis: Secondary | ICD-10-CM | POA: Diagnosis not present

## 2018-08-24 DIAGNOSIS — D631 Anemia in chronic kidney disease: Secondary | ICD-10-CM | POA: Diagnosis not present

## 2018-08-24 DIAGNOSIS — Z298 Encounter for other specified prophylactic measures: Secondary | ICD-10-CM | POA: Diagnosis not present

## 2018-08-24 DIAGNOSIS — N2581 Secondary hyperparathyroidism of renal origin: Secondary | ICD-10-CM | POA: Diagnosis not present

## 2018-08-24 DIAGNOSIS — N186 End stage renal disease: Secondary | ICD-10-CM | POA: Diagnosis not present

## 2018-08-26 DIAGNOSIS — D631 Anemia in chronic kidney disease: Secondary | ICD-10-CM | POA: Diagnosis not present

## 2018-08-26 DIAGNOSIS — N2581 Secondary hyperparathyroidism of renal origin: Secondary | ICD-10-CM | POA: Diagnosis not present

## 2018-08-26 DIAGNOSIS — Z298 Encounter for other specified prophylactic measures: Secondary | ICD-10-CM | POA: Diagnosis not present

## 2018-08-26 DIAGNOSIS — Z992 Dependence on renal dialysis: Secondary | ICD-10-CM | POA: Diagnosis not present

## 2018-08-26 DIAGNOSIS — D509 Iron deficiency anemia, unspecified: Secondary | ICD-10-CM | POA: Diagnosis not present

## 2018-08-26 DIAGNOSIS — N186 End stage renal disease: Secondary | ICD-10-CM | POA: Diagnosis not present

## 2018-08-29 DIAGNOSIS — N2581 Secondary hyperparathyroidism of renal origin: Secondary | ICD-10-CM | POA: Diagnosis not present

## 2018-08-29 DIAGNOSIS — D631 Anemia in chronic kidney disease: Secondary | ICD-10-CM | POA: Diagnosis not present

## 2018-08-29 DIAGNOSIS — Z298 Encounter for other specified prophylactic measures: Secondary | ICD-10-CM | POA: Diagnosis not present

## 2018-08-29 DIAGNOSIS — Z992 Dependence on renal dialysis: Secondary | ICD-10-CM | POA: Diagnosis not present

## 2018-08-29 DIAGNOSIS — N186 End stage renal disease: Secondary | ICD-10-CM | POA: Diagnosis not present

## 2018-08-29 DIAGNOSIS — D509 Iron deficiency anemia, unspecified: Secondary | ICD-10-CM | POA: Diagnosis not present

## 2018-08-30 ENCOUNTER — Other Ambulatory Visit (INDEPENDENT_AMBULATORY_CARE_PROVIDER_SITE_OTHER): Payer: Self-pay | Admitting: Nurse Practitioner

## 2018-08-30 ENCOUNTER — Encounter (INDEPENDENT_AMBULATORY_CARE_PROVIDER_SITE_OTHER): Payer: Self-pay

## 2018-08-30 MED ORDER — CEFAZOLIN SODIUM-DEXTROSE 1-4 GM/50ML-% IV SOLN: 1.0000 g | Freq: Once | INTRAVENOUS | Status: DC

## 2018-08-31 ENCOUNTER — Other Ambulatory Visit (INDEPENDENT_AMBULATORY_CARE_PROVIDER_SITE_OTHER): Payer: Self-pay | Admitting: Vascular Surgery

## 2018-08-31 ENCOUNTER — Other Ambulatory Visit: Payer: Self-pay

## 2018-08-31 ENCOUNTER — Other Ambulatory Visit (INDEPENDENT_AMBULATORY_CARE_PROVIDER_SITE_OTHER): Payer: Self-pay | Admitting: Nurse Practitioner

## 2018-08-31 ENCOUNTER — Encounter: Admission: RE | Payer: Self-pay | Source: Home / Self Care

## 2018-08-31 ENCOUNTER — Encounter (INDEPENDENT_AMBULATORY_CARE_PROVIDER_SITE_OTHER): Payer: Self-pay

## 2018-08-31 ENCOUNTER — Telehealth (INDEPENDENT_AMBULATORY_CARE_PROVIDER_SITE_OTHER): Payer: Self-pay

## 2018-08-31 ENCOUNTER — Ambulatory Visit
Admission: RE | Admit: 2018-08-31 | Discharge: 2018-08-31 | Disposition: A | Discharge status: Home / Self Care | Payer: Medicare Other | Source: Ambulatory Visit | Attending: Family Medicine | Admitting: Family Medicine

## 2018-08-31 ENCOUNTER — Ambulatory Visit: Admission: RE | Admit: 2018-08-31 | Payer: Medicare Other | Source: Home / Self Care | Admitting: Vascular Surgery

## 2018-08-31 DIAGNOSIS — N186 End stage renal disease: Secondary | ICD-10-CM | POA: Diagnosis not present

## 2018-08-31 DIAGNOSIS — Z298 Encounter for other specified prophylactic measures: Secondary | ICD-10-CM | POA: Diagnosis not present

## 2018-08-31 DIAGNOSIS — D509 Iron deficiency anemia, unspecified: Secondary | ICD-10-CM | POA: Diagnosis not present

## 2018-08-31 DIAGNOSIS — Z992 Dependence on renal dialysis: Secondary | ICD-10-CM | POA: Diagnosis not present

## 2018-08-31 DIAGNOSIS — D631 Anemia in chronic kidney disease: Secondary | ICD-10-CM | POA: Diagnosis not present

## 2018-08-31 DIAGNOSIS — N2581 Secondary hyperparathyroidism of renal origin: Secondary | ICD-10-CM | POA: Diagnosis not present

## 2018-08-31 SURGERY — DIALYSIS/PERMA CATHETER INSERTION
Anesthesia: Moderate Sedation

## 2018-08-31 MED ORDER — METHYLPREDNISOLONE SODIUM SUCC 125 MG IJ SOLR: 125.0000 mg | Freq: Once | INTRAMUSCULAR | Status: DC | PRN

## 2018-08-31 MED ORDER — MIDAZOLAM HCL 2 MG/ML PO SYRP: 8.0000 mg | ORAL_SOLUTION | Freq: Once | ORAL | Status: DC | PRN

## 2018-08-31 MED ORDER — SODIUM CHLORIDE 0.9 % IV SOLN
5.0000 mg | Freq: Once | INTRAVENOUS | Status: DC
  Filled 2018-08-31: qty 5

## 2018-08-31 MED ORDER — DIPHENHYDRAMINE HCL 50 MG/ML IJ SOLN: 50.0000 mg | Freq: Once | INTRAMUSCULAR | Status: DC | PRN

## 2018-08-31 MED ORDER — SODIUM CHLORIDE 0.9 % IV SOLN: 5.0000 mg | Freq: Once | INTRAVENOUS | Status: AC

## 2018-08-31 MED ORDER — ONDANSETRON HCL 4 MG/2ML IJ SOLN: 4.0000 mg | Freq: Four times a day (QID) | INTRAMUSCULAR | Status: DC | PRN

## 2018-08-31 MED ORDER — HYDROMORPHONE HCL 1 MG/ML IJ SOLN: 1.0000 mg | Freq: Once | INTRAMUSCULAR | Status: DC | PRN

## 2018-08-31 MED ORDER — SODIUM CHLORIDE 0.9 % IV SOLN: INTRAVENOUS | Status: DC

## 2018-08-31 MED ORDER — FAMOTIDINE 20 MG PO TABS: 40.0000 mg | ORAL_TABLET | Freq: Once | ORAL | Status: DC | PRN

## 2018-08-31 NOTE — Telephone Encounter (Signed)
Patient was scheduled today to have a TPA infusion at the hospital, the patient has been rescheduled for 09/04/2018 with a 6:45 am arrival time. This information was given to the patient over the phone. A message was left for [place] at [facility] in regards to this.

## 2018-08-31 NOTE — Progress Notes (Signed)
Patient to [facility] today for TPA infusion possible permcath exchange. In discussing the expected time for TPA infusion, patient states that he does not have time today to do this as he is planning to accompany his father to his "cancer appointment". Dr Lucky Cowboy at bedside discussing with patient, both parties agreed that Monday would be ok to reschedule. Called [facility] and left message regarding rescheduling, requested callback to confirm receipt of message.

## 2018-08-31 NOTE — Telephone Encounter (Signed)
This information will be faxed to [facility] at [facility] as well.

## 2018-09-02 DIAGNOSIS — N186 End stage renal disease: Secondary | ICD-10-CM | POA: Diagnosis not present

## 2018-09-02 DIAGNOSIS — Z298 Encounter for other specified prophylactic measures: Secondary | ICD-10-CM | POA: Diagnosis not present

## 2018-09-02 DIAGNOSIS — Z992 Dependence on renal dialysis: Secondary | ICD-10-CM | POA: Diagnosis not present

## 2018-09-02 DIAGNOSIS — D631 Anemia in chronic kidney disease: Secondary | ICD-10-CM | POA: Diagnosis not present

## 2018-09-02 DIAGNOSIS — D509 Iron deficiency anemia, unspecified: Secondary | ICD-10-CM | POA: Diagnosis not present

## 2018-09-02 DIAGNOSIS — N2581 Secondary hyperparathyroidism of renal origin: Secondary | ICD-10-CM | POA: Diagnosis not present

## 2018-09-04 ENCOUNTER — Ambulatory Visit
Admission: RE | Admit: 2018-09-04 | Discharge: 2018-09-04 | Disposition: A | Discharge status: Home / Self Care | Payer: Medicare Other | Source: Ambulatory Visit | Attending: Family Medicine | Admitting: Family Medicine

## 2018-09-04 ENCOUNTER — Other Ambulatory Visit (INDEPENDENT_AMBULATORY_CARE_PROVIDER_SITE_OTHER): Payer: Self-pay | Admitting: Nurse Practitioner

## 2018-09-04 ENCOUNTER — Other Ambulatory Visit: Payer: Self-pay

## 2018-09-04 MED ORDER — SODIUM CHLORIDE 0.9 % IV SOLN
5.0000 mg | Freq: Once | INTRAVENOUS | Status: DC
  Filled 2018-09-04: qty 5

## 2018-09-04 MED ORDER — ONDANSETRON HCL 4 MG/2ML IJ SOLN: 4.0000 mg | Freq: Four times a day (QID) | INTRAMUSCULAR | Status: DC | PRN

## 2018-09-04 MED ORDER — SODIUM CHLORIDE 0.9 % IV SOLN: 5.0000 mg | Freq: Once | INTRAVENOUS | Status: AC

## 2018-09-04 MED ORDER — HYDROMORPHONE HCL 1 MG/ML IJ SOLN: 1.0000 mg | Freq: Once | INTRAMUSCULAR | Status: DC | PRN

## 2018-09-04 MED ORDER — SODIUM CHLORIDE 0.9 % IV SOLN: INTRAVENOUS | Status: DC

## 2018-09-05 DIAGNOSIS — N186 End stage renal disease: Secondary | ICD-10-CM | POA: Diagnosis not present

## 2018-09-05 DIAGNOSIS — Z298 Encounter for other specified prophylactic measures: Secondary | ICD-10-CM | POA: Diagnosis not present

## 2018-09-05 DIAGNOSIS — D631 Anemia in chronic kidney disease: Secondary | ICD-10-CM | POA: Diagnosis not present

## 2018-09-05 DIAGNOSIS — Z992 Dependence on renal dialysis: Secondary | ICD-10-CM | POA: Diagnosis not present

## 2018-09-05 DIAGNOSIS — D509 Iron deficiency anemia, unspecified: Secondary | ICD-10-CM | POA: Diagnosis not present

## 2018-09-05 DIAGNOSIS — N2581 Secondary hyperparathyroidism of renal origin: Secondary | ICD-10-CM | POA: Diagnosis not present

## 2018-09-07 DIAGNOSIS — N2581 Secondary hyperparathyroidism of renal origin: Secondary | ICD-10-CM | POA: Diagnosis not present

## 2018-09-07 DIAGNOSIS — D631 Anemia in chronic kidney disease: Secondary | ICD-10-CM | POA: Diagnosis not present

## 2018-09-07 DIAGNOSIS — Z298 Encounter for other specified prophylactic measures: Secondary | ICD-10-CM | POA: Diagnosis not present

## 2018-09-07 DIAGNOSIS — Z992 Dependence on renal dialysis: Secondary | ICD-10-CM | POA: Diagnosis not present

## 2018-09-07 DIAGNOSIS — D509 Iron deficiency anemia, unspecified: Secondary | ICD-10-CM | POA: Diagnosis not present

## 2018-09-07 DIAGNOSIS — N186 End stage renal disease: Secondary | ICD-10-CM | POA: Diagnosis not present

## 2018-09-09 DIAGNOSIS — D631 Anemia in chronic kidney disease: Secondary | ICD-10-CM | POA: Diagnosis not present

## 2018-09-09 DIAGNOSIS — N186 End stage renal disease: Secondary | ICD-10-CM | POA: Diagnosis not present

## 2018-09-09 DIAGNOSIS — Z992 Dependence on renal dialysis: Secondary | ICD-10-CM | POA: Diagnosis not present

## 2018-09-09 DIAGNOSIS — Z298 Encounter for other specified prophylactic measures: Secondary | ICD-10-CM | POA: Diagnosis not present

## 2018-09-09 DIAGNOSIS — N2581 Secondary hyperparathyroidism of renal origin: Secondary | ICD-10-CM | POA: Diagnosis not present

## 2018-09-09 DIAGNOSIS — D509 Iron deficiency anemia, unspecified: Secondary | ICD-10-CM | POA: Diagnosis not present

## 2018-09-12 ENCOUNTER — Encounter (INDEPENDENT_AMBULATORY_CARE_PROVIDER_SITE_OTHER): Payer: Self-pay

## 2018-09-12 ENCOUNTER — Other Ambulatory Visit (INDEPENDENT_AMBULATORY_CARE_PROVIDER_SITE_OTHER): Payer: Self-pay | Admitting: Nurse Practitioner

## 2018-09-12 DIAGNOSIS — D631 Anemia in chronic kidney disease: Secondary | ICD-10-CM | POA: Diagnosis not present

## 2018-09-12 DIAGNOSIS — N2581 Secondary hyperparathyroidism of renal origin: Secondary | ICD-10-CM | POA: Diagnosis not present

## 2018-09-12 DIAGNOSIS — N186 End stage renal disease: Secondary | ICD-10-CM | POA: Diagnosis not present

## 2018-09-12 DIAGNOSIS — D509 Iron deficiency anemia, unspecified: Secondary | ICD-10-CM | POA: Diagnosis not present

## 2018-09-12 DIAGNOSIS — Z298 Encounter for other specified prophylactic measures: Secondary | ICD-10-CM | POA: Diagnosis not present

## 2018-09-12 DIAGNOSIS — Z992 Dependence on renal dialysis: Secondary | ICD-10-CM | POA: Diagnosis not present

## 2018-09-12 MED ORDER — CLINDAMYCIN PHOSPHATE 300 MG/50ML IV SOLN
300.0000 mg | Freq: Once | INTRAVENOUS | Status: AC
  Administered 2018-09-13: 13:00:00 300 mg via INTRAVENOUS

## 2018-09-13 ENCOUNTER — Encounter
Admission: RE | Disposition: A | Discharge status: Home / Self Care | Payer: Self-pay | Source: Home / Self Care | Attending: Vascular Surgery

## 2018-09-13 ENCOUNTER — Other Ambulatory Visit: Payer: Self-pay

## 2018-09-13 ENCOUNTER — Ambulatory Visit
Admission: RE | Admit: 2018-09-13 | Discharge: 2018-09-13 | Disposition: A | Discharge status: Home / Self Care | Payer: Medicare Other | Attending: Vascular Surgery | Admitting: Vascular Surgery

## 2018-09-13 DIAGNOSIS — T82868A Thrombosis of vascular prosthetic devices, implants and grafts, initial encounter: Secondary | ICD-10-CM | POA: Insufficient documentation

## 2018-09-13 DIAGNOSIS — Y841 Kidney dialysis as the cause of abnormal reaction of the patient, or of later complication, without mention of misadventure at the time of the procedure: Secondary | ICD-10-CM | POA: Insufficient documentation

## 2018-09-13 DIAGNOSIS — I120 Hypertensive chronic kidney disease with stage 5 chronic kidney disease or end stage renal disease: Secondary | ICD-10-CM | POA: Insufficient documentation

## 2018-09-13 DIAGNOSIS — I2510 Atherosclerotic heart disease of native coronary artery without angina pectoris: Secondary | ICD-10-CM | POA: Diagnosis not present

## 2018-09-13 DIAGNOSIS — N186 End stage renal disease: Secondary | ICD-10-CM | POA: Insufficient documentation

## 2018-09-13 DIAGNOSIS — E1122 Type 2 diabetes mellitus with diabetic chronic kidney disease: Secondary | ICD-10-CM | POA: Insufficient documentation

## 2018-09-13 DIAGNOSIS — Z79899 Other long term (current) drug therapy: Secondary | ICD-10-CM | POA: Diagnosis not present

## 2018-09-13 DIAGNOSIS — Z992 Dependence on renal dialysis: Secondary | ICD-10-CM | POA: Diagnosis not present

## 2018-09-13 DIAGNOSIS — Z87891 Personal history of nicotine dependence: Secondary | ICD-10-CM | POA: Diagnosis not present

## 2018-09-13 HISTORY — PX: DIALYSIS/PERMA CATHETER INSERTION: CATH118288

## 2018-09-13 LAB — GLUCOSE, CAPILLARY
Glucose-Capillary: 85 mg/dL (ref 70–99)
Glucose-Capillary: 83 mg/dL (ref 70–99)
Glucose-Capillary: 80 mg/dL (ref 70–99)

## 2018-09-13 LAB — POTASSIUM (ARMC VASCULAR LAB ONLY): Potassium (ARMC vascular lab): 4.7 (ref 3.5–5.1)

## 2018-09-13 SURGERY — DIALYSIS/PERMA CATHETER INSERTION
Anesthesia: Moderate Sedation

## 2018-09-13 MED ORDER — HEPARIN SODIUM (PORCINE) 1000 UNIT/ML IJ SOLN
INTRAMUSCULAR | Status: AC
  Filled 2018-09-13: qty 1

## 2018-09-13 MED ORDER — FENTANYL CITRATE (PF) 100 MCG/2ML IJ SOLN
INTRAMUSCULAR | Status: DC | PRN
  Administered 2018-09-13: 50 ug via INTRAVENOUS
  Administered 2018-09-13: 25 ug via INTRAVENOUS

## 2018-09-13 MED ORDER — MIDAZOLAM HCL 5 MG/5ML IJ SOLN
INTRAMUSCULAR | Status: AC
  Filled 2018-09-13: qty 5

## 2018-09-13 MED ORDER — METHYLPREDNISOLONE SODIUM SUCC 125 MG IJ SOLR: 125.0000 mg | Freq: Once | INTRAMUSCULAR | Status: DC | PRN

## 2018-09-13 MED ORDER — MIDAZOLAM HCL 2 MG/ML PO SYRP: 8.0000 mg | ORAL_SOLUTION | Freq: Once | ORAL | Status: DC | PRN

## 2018-09-13 MED ORDER — CLINDAMYCIN PHOSPHATE 300 MG/50ML IV SOLN
INTRAVENOUS | Status: AC
  Administered 2018-09-13: 13:00:00 300 mg via INTRAVENOUS
  Filled 2018-09-13: qty 50

## 2018-09-13 MED ORDER — ONDANSETRON HCL 4 MG/2ML IJ SOLN: 4.0000 mg | Freq: Four times a day (QID) | INTRAMUSCULAR | Status: DC | PRN

## 2018-09-13 MED ORDER — SODIUM CHLORIDE 0.9 % IV SOLN: INTRAVENOUS | Status: DC

## 2018-09-13 MED ORDER — FAMOTIDINE 20 MG PO TABS: 40.0000 mg | ORAL_TABLET | Freq: Once | ORAL | Status: DC | PRN

## 2018-09-13 MED ORDER — SODIUM CHLORIDE FLUSH 0.9 % IV SOLN
INTRAVENOUS | Status: AC
  Filled 2018-09-13: qty 20

## 2018-09-13 MED ORDER — MIDAZOLAM HCL 2 MG/2ML IJ SOLN
INTRAMUSCULAR | Status: DC | PRN
  Administered 2018-09-13: 2 mg via INTRAVENOUS
  Administered 2018-09-13: 0.5 mg via INTRAVENOUS

## 2018-09-13 MED ORDER — HEPARIN SODIUM (PORCINE) 10000 UNIT/ML IJ SOLN
INTRAMUSCULAR | Status: AC
  Filled 2018-09-13: qty 1

## 2018-09-13 MED ORDER — BACITRACIN-NEOMYCIN-POLYMYXIN 400-5-5000 EX OINT
TOPICAL_OINTMENT | CUTANEOUS | Status: AC
  Filled 2018-09-13: qty 1

## 2018-09-13 MED ORDER — DIPHENHYDRAMINE HCL 50 MG/ML IJ SOLN: 50.0000 mg | Freq: Once | INTRAMUSCULAR | Status: DC | PRN

## 2018-09-13 MED ORDER — HYDROMORPHONE HCL 1 MG/ML IJ SOLN: 1.0000 mg | Freq: Once | INTRAMUSCULAR | Status: DC | PRN

## 2018-09-13 MED ORDER — LIDOCAINE HCL (PF) 1 % IJ SOLN
INTRAMUSCULAR | Status: AC
  Filled 2018-09-13: qty 30

## 2018-09-13 MED ORDER — FENTANYL CITRATE (PF) 100 MCG/2ML IJ SOLN
INTRAMUSCULAR | Status: AC
  Filled 2018-09-13: qty 2

## 2018-09-13 SURGICAL SUPPLY — 9 items
BIOPATCH WHT 1IN DISK W/4.0 H (GAUZE/BANDAGES/DRESSINGS) ×3 IMPLANT
CATH PALIN MAXID VT KIT 19CM (CATHETERS) ×3 IMPLANT
CATH PALINDROME RT-P 15FX19CM (CATHETERS) ×3 IMPLANT
DERMABOND ADVANCED (GAUZE/BANDAGES/DRESSINGS) ×2
DERMABOND ADVANCED .7 DNX12 (GAUZE/BANDAGES/DRESSINGS) ×1 IMPLANT
GUIDEWIRE SUPER STIFF .035X180 (WIRE) ×3 IMPLANT
PACK ANGIOGRAPHY (CUSTOM PROCEDURE TRAY) ×3 IMPLANT
SUT MNCRL AB 4-0 PS2 18 (SUTURE) ×3 IMPLANT
SUT SILK 0 FSL (SUTURE) ×3 IMPLANT

## 2018-09-13 NOTE — H&P (Signed)
[facility] Admission History & Physical  MRN : [id number]  Jake Gomez is a 48 y.o. ([birth year]) male who presents with chief complaint of No chief complaint on file. Marland Kitchen  History of Present Illness:  I am asked to evaluate the patient by the dialysis center. The patient was sent here because they were unable to achieve adequate dialysis yesterday. Furthermore the Center states they were unable to aspirate either lumen of the catheter yesterday.  This problem has been getting worse for about 1 week. The patient is unaware of any other change.   Patient denies pain or tenderness overlying the access.  There is no pain with dialysis.  Patient denies fevers or shaking chills while on dialysis.    There have multiple any past interventions and declots of his multiple different access.  The patient is not chronically hypotensive on dialysis.   Current Facility-Administered Medications  Medication Dose Route Frequency Provider Last Rate Last Dose  . 0.9 %  sodium chloride infusion   Intravenous Continuous Eulogio Ditch E, NP      . diphenhydrAMINE (BENADRYL) injection 50 mg  50 mg Intravenous Once PRN Kris Hartmann, NP      . famotidine (PEPCID) tablet 40 mg  40 mg Oral Once PRN Kris Hartmann, NP      . fentaNYL (SUBLIMAZE) 100 MCG/2ML injection           . heparin 10000 UNIT/ML injection           . HYDROmorphone (DILAUDID) injection 1 mg  1 mg Intravenous Once PRN Eulogio Ditch E, NP      . lidocaine (PF) (XYLOCAINE) 1 % injection           . methylPREDNISolone sodium succinate (SOLU-MEDROL) 125 mg/2 mL injection 125 mg  125 mg Intravenous Once PRN Eulogio Ditch E, NP      . midazolam (VERSED) 2 MG/ML syrup 8 mg  8 mg Oral Once PRN Kris Hartmann, NP      . midazolam (VERSED) 5 MG/5ML injection           . neomycin-bacitracin-polymyxin (NEOSPORIN) [id number] ointment packet           . ondansetron (ZOFRAN) injection 4 mg  4 mg Intravenous Q6H PRN Eulogio Ditch E,  NP      . sodium chloride flush 0.9 % injection              Past Surgical History:  Procedure Laterality Date  . DIALYSIS/PERMA CATHETER INSERTION N/A 07/19/2018   Procedure: DIALYSIS/PERMA CATHETER INSERTION;  Surgeon: Algernon Huxley, MD;  Location: [facility];  Service: Cardiovascular;  Laterality: N/A;  . peritonal dialysis    . REMOVAL OF A DIALYSIS CATHETER N/A 07/17/2018   Procedure: REMOVAL OF A DIALYSIS CATHETER;  Surgeon: Algernon Huxley, MD;  Location: [facility];  Service: Vascular;  Laterality: N/A;    Social History Social History   Tobacco Use  . Smoking status: Former Research scientist (life sciences)  . Smokeless tobacco: Former Systems developer    Quit date: 07/16/2009  Substance Use Topics  . Alcohol use: No    Frequency: Never  . Drug use: No    Family History Family History  Problem Relation Age of Onset  . Thyroid disease Mother   . Stroke Father   . Cancer Father   . Diabetes Father     No family history of bleeding or clotting disorders, autoimmune disease or porphyria  No Known Allergies  REVIEW OF SYSTEMS (Negative unless checked)  Constitutional: [] Weight loss  [] Fever  [] Chills Cardiac: [] Chest pain   [] Chest pressure   [] Palpitations   [] Shortness of breath when laying flat   [] Shortness of breath at rest   [x] Shortness of breath with exertion. Vascular:  [] Pain in legs with walking   [] Pain in legs at rest   [] Pain in legs when laying flat   [] Claudication   [] Pain in feet when walking  [] Pain in feet at rest  [] Pain in feet when laying flat   [] History of DVT   [] Phlebitis   [] Swelling in legs   [] Varicose veins   [] Non-healing ulcers Pulmonary:   [] Uses home oxygen   [] Productive cough   [] Hemoptysis   [] Wheeze  [] COPD   [] Asthma Neurologic:  [] Dizziness  [] Blackouts   [] Seizures   [] History of stroke   [] History of TIA  [] Aphasia   [] Temporary blindness   [] Dysphagia   [] Weakness or numbness in arms   [] Weakness or numbness in legs Musculoskeletal:  [] Arthritis   [] Joint  swelling   [] Joint pain   [] Low back pain Hematologic:  [] Easy bruising  [] Easy bleeding   [] Hypercoagulable state   [] Anemic  [] Hepatitis Gastrointestinal:  [] Blood in stool   [] Vomiting blood  [] Gastroesophageal reflux/heartburn   [] Difficulty swallowing. Genitourinary:  [x] Chronic kidney disease   [] Difficult urination  [] Frequent urination  [] Burning with urination   [] Blood in urine Skin:  [] Rashes   [] Ulcers   [] Wounds Psychological:  [] History of anxiety   []  History of major depression.  Physical Examination  Vitals:   09/13/18 1345 09/13/18 1350 09/13/18 1355 09/13/18 1400  BP: (!) 176/82 (!) 153/84 (!) 173/83 (!) 163/86  Pulse:      Resp: 16 15 14 14   Temp:      TempSrc:      SpO2: 100% 100% 100% 100%  Weight:      Height:       Body mass index is 37.88 kg/m. Gen: WD/WN, NAD Head: [place]/AT, No temporalis wasting. Prominent temp pulse not noted. Ear/Nose/Throat: Hearing grossly intact, nares w/o erythema or drainage, oropharynx w/o Erythema/Exudate,  Eyes: Conjunctiva clear, sclera non-icteric Neck: Trachea midline.  No JVD.  Pulmonary:  Good air movement, respirations not labored, no use of accessory muscles.  Cardiac: RRR, normal S1, S2. Vascular: Right IJ tunneled catheter without tenderness or drainage Vessel Right Left  Radial Palpable Palpable  Gastrointestinal: soft, non-tender/non-distended. No guarding/reflex.  Musculoskeletal: M/S 5/5 throughout.  Extremities without ischemic changes.  No deformity or atrophy.  Neurologic: Sensation grossly intact in extremities.  Symmetrical.  Speech is fluent. Motor exam as listed above. Psychiatric: Judgment intact, Mood & affect appropriate for pt's clinical situation. Dermatologic: No rashes or ulcers noted.  No cellulitis or open wounds. Lymph : No Cervical, Axillary, or Inguinal lymphadenopathy.   CBC Lab Results  Component Value Date   WBC 11.4 (H) 07/20/2018   HGB 8.2 (L) 07/20/2018   HCT 26.4 (L) 07/20/2018    MCV 101.9 (H) 07/20/2018   PLT 291 07/20/2018    BMET    Component Value Date/Time   NA 131 (L) 07/19/2018 0459   NA 142 04/30/2014 1244   K 4.3 07/19/2018 0459   K 4.3 04/30/2014 1244   CL 90 (L) 07/19/2018 0459   CL 109 (H) 04/30/2014 1244   CO2 25 07/19/2018 0459   CO2 25 04/30/2014 1244   GLUCOSE 142 (H) 07/19/2018 0459   GLUCOSE 94 04/30/2014 1244   BUN 84 (  H) 07/19/2018 0459   BUN 61 (H) 04/30/2014 1244   CREATININE 14.13 (H) 07/19/2018 0459   CREATININE 4.40 (H) 04/30/2014 1244   CALCIUM 9.7 07/19/2018 0459   CALCIUM 7.8 (L) 04/30/2014 1244   GFRNONAA 4 (L) 07/19/2018 0459   GFRNONAA 16 (L) 04/30/2014 1244   GFRAA 4 (L) 07/19/2018 0459   GFRAA 19 (L) 04/30/2014 1244   CrCl cannot be calculated (Patient's most recent lab result is older than the maximum 21 days allowed.).  COAG Lab Results  Component Value Date   INR 1.07 07/17/2018    Radiology Dg Chest 2 View  Result Date: 08/17/2018 CLINICAL DATA:  Shortness of Breath EXAM: CHEST - 2 VIEW COMPARISON:  05/25/2018 FINDINGS: Stable small left pleural effusion versus pleural thickening and linear densities throughout the left lung most compatible with scarring. No confluent opacity on the right. Heart is upper limits normal in size. Right dialysis catheter in place with the tip in the right atrium. No acute bony abnormality. IMPRESSION: Chronic changes on the left with small chronic pleural effusion versus pleural thickening and left lung scarring. No active disease. Electronically Signed   By: Rolm Baptise M.D.   On: 08/17/2018 09:53    Assessment/Plan 1.  Complication dialysis device with thrombosis AV access:  Patient's Right IJ tunneled catheter is thrombosed. The patient will undergo exchange of the catheter same venous access using interventional techniques.  The risks and benefits were described to the patient.  All questions were answered.  The patient agrees to proceed with intervention.  2.  End-stage renal  disease requiring hemodialysis:  Patient will continue dialysis therapy without further interruption if a successful exchange is not achieved then new site will be found for tunneled catheter placement. Dialysis has already been arranged since the patient missed their previous session 3.  Hypertension:  Patient will continue medical management; nephrology is following no changes in oral medications. 4. Diabetes mellitus:  Glucose will be monitored and oral medications been held this morning once the patient has undergone the patient's procedure po intake will be reinitiated and again Accu-Cheks will be used to assess the blood glucose level and treat as needed. The patient will be restarted on the patient's usual hypoglycemic regime 5.  Coronary artery disease:  EKG will be monitored. Nitrates will be used if needed. The patient's oral cardiac medications will be continued.    Hortencia Pilar, MD  09/13/2018 2:18 PM

## 2018-09-13 NOTE — Op Note (Signed)
OPERATIVE NOTE   PROCEDURE: 1. Insertion of tunneled dialysis catheter right IJ approach same venous access.  PRE-OPERATIVE DIAGNOSIS: Nonfunction of existing tunneled dialysis catheter, and stage renal disease requiring hemodialysis   POST-OPERATIVE DIAGNOSIS: Same SURGEON: Hortencia Pilar  ANESTHESIA: Conscious sedation was administered under my direct supervision by the interventional radiology RN.  IV Versed plus fentanyl were utilized. Continuous ECG, pulse oximetry and blood pressure was monitored throughout the entire procedure.  Conscious sedation was for a total of 35 minutes.  ESTIMATED BLOOD LOSS: Minimal cc  CONTRAST USED:  None  FLUOROSCOPY TIME: 3.0 minutes  INDICATIONS:   Jake Gomez is a 48 y.o.y.o. male who presents with poor flow and nonfunction of the tunneled dialysis catheter.  Adequate dialysis has not been possible.  DESCRIPTION: After obtaining full informed written consent, the patient was positioned supine. The right neck and chest wall was prepped and draped in a sterile fashion. The cuff is localized and using blunt and sharp dissection it is freed from the surrounding adhesions.  The existing catheter is then transected proximal to the cuff.  The guidewire is advanced without difficulty under fluoroscopy.  Dilators are passed over the wire as needed and the tunneled dialysis catheter is fed into the central venous system without difficulty.  However even a 19 cm tip to cuff catheter appears to be too long and with proper cuff positioning it extends to the bottom of the atrium.  1% lidocaine was then infiltrated into the base of the neck overlying the palpable catheter and a transverse incision was made.  The new 19 cm tip to cuff catheter was pulled into view transected and a J-wire advanced under fluoroscopic guidance into the inferior vena cava.  The 2 pieces of the 19 cm tip to cuff catheter were removed.  A 19 cm tip to cuff retrograde catheter was then  opened onto the field the dilators advanced over the wire and the catheter inserted into the central venous system.  Catheter was then positioned under fluoroscopy with the tips in the mid atrium and an exit site selected on the chest wall.  1% lidocaine was infiltrated into the soft tissues a small transverse incision was made in the tunneling device passed from the exit site to the counterincision at the base of the neck.  The catheter was then pulled subcutaneously and the hub assembly connected.  Under fluoroscopy the tip was positioned in the mid atrium and the catheter is smooth in contour free of kinks.  Both lumens were aspirated and flushed easily.  Both lumens were then packed with 5000 units of heparin per lumen.  Neck counterincision was closed with a 4-0 Monocryl subcuticular Dermabond catheter secured to the chest wall with 0 silk.  Sterile dressing with a Biopatch was applied.  Patient tolerated procedure well.  COMPLICATIONS: None  CONDITION: Good  Hortencia Pilar [facility] Office:  [phone]   09/13/2018,2:28 PM

## 2018-09-14 ENCOUNTER — Encounter: Payer: Self-pay | Admitting: Vascular Surgery

## 2018-09-14 DIAGNOSIS — Z992 Dependence on renal dialysis: Secondary | ICD-10-CM | POA: Diagnosis not present

## 2018-09-14 DIAGNOSIS — N2581 Secondary hyperparathyroidism of renal origin: Secondary | ICD-10-CM | POA: Diagnosis not present

## 2018-09-14 DIAGNOSIS — D509 Iron deficiency anemia, unspecified: Secondary | ICD-10-CM | POA: Diagnosis not present

## 2018-09-14 DIAGNOSIS — N186 End stage renal disease: Secondary | ICD-10-CM | POA: Diagnosis not present

## 2018-09-14 DIAGNOSIS — D631 Anemia in chronic kidney disease: Secondary | ICD-10-CM | POA: Diagnosis not present

## 2018-09-16 DIAGNOSIS — N186 End stage renal disease: Secondary | ICD-10-CM | POA: Diagnosis not present

## 2018-09-16 DIAGNOSIS — Z992 Dependence on renal dialysis: Secondary | ICD-10-CM | POA: Diagnosis not present

## 2018-09-16 DIAGNOSIS — N2581 Secondary hyperparathyroidism of renal origin: Secondary | ICD-10-CM | POA: Diagnosis not present

## 2018-09-16 DIAGNOSIS — D509 Iron deficiency anemia, unspecified: Secondary | ICD-10-CM | POA: Diagnosis not present

## 2018-09-16 DIAGNOSIS — D631 Anemia in chronic kidney disease: Secondary | ICD-10-CM | POA: Diagnosis not present

## 2018-09-19 DIAGNOSIS — D509 Iron deficiency anemia, unspecified: Secondary | ICD-10-CM | POA: Diagnosis not present

## 2018-09-19 DIAGNOSIS — N186 End stage renal disease: Secondary | ICD-10-CM | POA: Diagnosis not present

## 2018-09-19 DIAGNOSIS — D631 Anemia in chronic kidney disease: Secondary | ICD-10-CM | POA: Diagnosis not present

## 2018-09-19 DIAGNOSIS — N2581 Secondary hyperparathyroidism of renal origin: Secondary | ICD-10-CM | POA: Diagnosis not present

## 2018-09-19 DIAGNOSIS — Z992 Dependence on renal dialysis: Secondary | ICD-10-CM | POA: Diagnosis not present

## 2018-09-21 DIAGNOSIS — N186 End stage renal disease: Secondary | ICD-10-CM | POA: Diagnosis not present

## 2018-09-21 DIAGNOSIS — D631 Anemia in chronic kidney disease: Secondary | ICD-10-CM | POA: Diagnosis not present

## 2018-09-21 DIAGNOSIS — D509 Iron deficiency anemia, unspecified: Secondary | ICD-10-CM | POA: Diagnosis not present

## 2018-09-21 DIAGNOSIS — N2581 Secondary hyperparathyroidism of renal origin: Secondary | ICD-10-CM | POA: Diagnosis not present

## 2018-09-21 DIAGNOSIS — Z992 Dependence on renal dialysis: Secondary | ICD-10-CM | POA: Diagnosis not present

## 2018-09-23 DIAGNOSIS — N2581 Secondary hyperparathyroidism of renal origin: Secondary | ICD-10-CM | POA: Diagnosis not present

## 2018-09-23 DIAGNOSIS — N186 End stage renal disease: Secondary | ICD-10-CM | POA: Diagnosis not present

## 2018-09-23 DIAGNOSIS — D631 Anemia in chronic kidney disease: Secondary | ICD-10-CM | POA: Diagnosis not present

## 2018-09-23 DIAGNOSIS — D509 Iron deficiency anemia, unspecified: Secondary | ICD-10-CM | POA: Diagnosis not present

## 2018-09-23 DIAGNOSIS — Z992 Dependence on renal dialysis: Secondary | ICD-10-CM | POA: Diagnosis not present

## 2018-09-26 DIAGNOSIS — N186 End stage renal disease: Secondary | ICD-10-CM | POA: Diagnosis not present

## 2018-09-26 DIAGNOSIS — D509 Iron deficiency anemia, unspecified: Secondary | ICD-10-CM | POA: Diagnosis not present

## 2018-09-26 DIAGNOSIS — Z992 Dependence on renal dialysis: Secondary | ICD-10-CM | POA: Diagnosis not present

## 2018-09-26 DIAGNOSIS — N2581 Secondary hyperparathyroidism of renal origin: Secondary | ICD-10-CM | POA: Diagnosis not present

## 2018-09-26 DIAGNOSIS — D631 Anemia in chronic kidney disease: Secondary | ICD-10-CM | POA: Diagnosis not present

## 2018-09-28 DIAGNOSIS — D509 Iron deficiency anemia, unspecified: Secondary | ICD-10-CM | POA: Diagnosis not present

## 2018-09-28 DIAGNOSIS — Z992 Dependence on renal dialysis: Secondary | ICD-10-CM | POA: Diagnosis not present

## 2018-09-28 DIAGNOSIS — N186 End stage renal disease: Secondary | ICD-10-CM | POA: Diagnosis not present

## 2018-09-28 DIAGNOSIS — N2581 Secondary hyperparathyroidism of renal origin: Secondary | ICD-10-CM | POA: Diagnosis not present

## 2018-09-28 DIAGNOSIS — D631 Anemia in chronic kidney disease: Secondary | ICD-10-CM | POA: Diagnosis not present

## 2018-09-30 DIAGNOSIS — Z992 Dependence on renal dialysis: Secondary | ICD-10-CM | POA: Diagnosis not present

## 2018-09-30 DIAGNOSIS — D509 Iron deficiency anemia, unspecified: Secondary | ICD-10-CM | POA: Diagnosis not present

## 2018-09-30 DIAGNOSIS — D631 Anemia in chronic kidney disease: Secondary | ICD-10-CM | POA: Diagnosis not present

## 2018-09-30 DIAGNOSIS — N2581 Secondary hyperparathyroidism of renal origin: Secondary | ICD-10-CM | POA: Diagnosis not present

## 2018-09-30 DIAGNOSIS — N186 End stage renal disease: Secondary | ICD-10-CM | POA: Diagnosis not present

## 2018-10-02 ENCOUNTER — Telehealth (INDEPENDENT_AMBULATORY_CARE_PROVIDER_SITE_OTHER): Payer: Self-pay

## 2018-10-02 NOTE — Telephone Encounter (Signed)
Spoke with Marcie Bal from [facility] regarding the patient getting cardiac clearance. I explained that a referral had been placed twice for the patient to be seen at [facility] and when they contacted the patient he refused the appts. Marcie Bal stated she would contact them to help get the patient cleared.

## 2018-10-03 DIAGNOSIS — D631 Anemia in chronic kidney disease: Secondary | ICD-10-CM | POA: Diagnosis not present

## 2018-10-03 DIAGNOSIS — D509 Iron deficiency anemia, unspecified: Secondary | ICD-10-CM | POA: Diagnosis not present

## 2018-10-03 DIAGNOSIS — N186 End stage renal disease: Secondary | ICD-10-CM | POA: Diagnosis not present

## 2018-10-03 DIAGNOSIS — N2581 Secondary hyperparathyroidism of renal origin: Secondary | ICD-10-CM | POA: Diagnosis not present

## 2018-10-03 DIAGNOSIS — Z992 Dependence on renal dialysis: Secondary | ICD-10-CM | POA: Diagnosis not present

## 2018-10-05 DIAGNOSIS — N186 End stage renal disease: Secondary | ICD-10-CM | POA: Diagnosis not present

## 2018-10-05 DIAGNOSIS — Z992 Dependence on renal dialysis: Secondary | ICD-10-CM | POA: Diagnosis not present

## 2018-10-05 DIAGNOSIS — D509 Iron deficiency anemia, unspecified: Secondary | ICD-10-CM | POA: Diagnosis not present

## 2018-10-05 DIAGNOSIS — D631 Anemia in chronic kidney disease: Secondary | ICD-10-CM | POA: Diagnosis not present

## 2018-10-05 DIAGNOSIS — N2581 Secondary hyperparathyroidism of renal origin: Secondary | ICD-10-CM | POA: Diagnosis not present

## 2018-10-07 DIAGNOSIS — N186 End stage renal disease: Secondary | ICD-10-CM | POA: Diagnosis not present

## 2018-10-07 DIAGNOSIS — D631 Anemia in chronic kidney disease: Secondary | ICD-10-CM | POA: Diagnosis not present

## 2018-10-07 DIAGNOSIS — Z992 Dependence on renal dialysis: Secondary | ICD-10-CM | POA: Diagnosis not present

## 2018-10-07 DIAGNOSIS — N2581 Secondary hyperparathyroidism of renal origin: Secondary | ICD-10-CM | POA: Diagnosis not present

## 2018-10-07 DIAGNOSIS — D509 Iron deficiency anemia, unspecified: Secondary | ICD-10-CM | POA: Diagnosis not present

## 2018-10-09 ENCOUNTER — Other Ambulatory Visit: Payer: Self-pay | Admitting: Family Medicine

## 2018-10-09 DIAGNOSIS — Z794 Long term (current) use of insulin: Principal | ICD-10-CM

## 2018-10-09 DIAGNOSIS — E1129 Type 2 diabetes mellitus with other diabetic kidney complication: Secondary | ICD-10-CM

## 2018-10-09 DIAGNOSIS — R809 Proteinuria, unspecified: Principal | ICD-10-CM

## 2018-10-10 DIAGNOSIS — N186 End stage renal disease: Secondary | ICD-10-CM | POA: Diagnosis not present

## 2018-10-10 DIAGNOSIS — Z992 Dependence on renal dialysis: Secondary | ICD-10-CM | POA: Diagnosis not present

## 2018-10-10 DIAGNOSIS — D509 Iron deficiency anemia, unspecified: Secondary | ICD-10-CM | POA: Diagnosis not present

## 2018-10-10 DIAGNOSIS — N2581 Secondary hyperparathyroidism of renal origin: Secondary | ICD-10-CM | POA: Diagnosis not present

## 2018-10-10 DIAGNOSIS — D631 Anemia in chronic kidney disease: Secondary | ICD-10-CM | POA: Diagnosis not present

## 2018-10-12 DIAGNOSIS — N2581 Secondary hyperparathyroidism of renal origin: Secondary | ICD-10-CM | POA: Diagnosis not present

## 2018-10-12 DIAGNOSIS — Z992 Dependence on renal dialysis: Secondary | ICD-10-CM | POA: Diagnosis not present

## 2018-10-12 DIAGNOSIS — N186 End stage renal disease: Secondary | ICD-10-CM | POA: Diagnosis not present

## 2018-10-12 DIAGNOSIS — D631 Anemia in chronic kidney disease: Secondary | ICD-10-CM | POA: Diagnosis not present

## 2018-10-12 DIAGNOSIS — D509 Iron deficiency anemia, unspecified: Secondary | ICD-10-CM | POA: Diagnosis not present

## 2018-10-14 DIAGNOSIS — D509 Iron deficiency anemia, unspecified: Secondary | ICD-10-CM | POA: Diagnosis not present

## 2018-10-14 DIAGNOSIS — Z23 Encounter for immunization: Secondary | ICD-10-CM | POA: Diagnosis not present

## 2018-10-14 DIAGNOSIS — D631 Anemia in chronic kidney disease: Secondary | ICD-10-CM | POA: Diagnosis not present

## 2018-10-14 DIAGNOSIS — N186 End stage renal disease: Secondary | ICD-10-CM | POA: Diagnosis not present

## 2018-10-14 DIAGNOSIS — Z992 Dependence on renal dialysis: Secondary | ICD-10-CM | POA: Diagnosis not present

## 2018-10-17 ENCOUNTER — Encounter: Payer: Self-pay | Admitting: Family Medicine

## 2018-10-17 ENCOUNTER — Other Ambulatory Visit: Payer: Self-pay

## 2018-10-17 ENCOUNTER — Ambulatory Visit (INDEPENDENT_AMBULATORY_CARE_PROVIDER_SITE_OTHER): Payer: Medicare Other | Admitting: Family Medicine

## 2018-10-17 VITALS — BP 124/82 | HR 68 | Ht 68.0 in | Wt 231.0 lb

## 2018-10-17 DIAGNOSIS — Z23 Encounter for immunization: Secondary | ICD-10-CM | POA: Diagnosis not present

## 2018-10-17 DIAGNOSIS — R809 Proteinuria, unspecified: Secondary | ICD-10-CM | POA: Diagnosis not present

## 2018-10-17 DIAGNOSIS — E1129 Type 2 diabetes mellitus with other diabetic kidney complication: Secondary | ICD-10-CM | POA: Diagnosis not present

## 2018-10-17 DIAGNOSIS — Z794 Long term (current) use of insulin: Secondary | ICD-10-CM

## 2018-10-17 DIAGNOSIS — Z992 Dependence on renal dialysis: Secondary | ICD-10-CM | POA: Diagnosis not present

## 2018-10-17 DIAGNOSIS — N186 End stage renal disease: Secondary | ICD-10-CM | POA: Diagnosis not present

## 2018-10-17 DIAGNOSIS — D509 Iron deficiency anemia, unspecified: Secondary | ICD-10-CM | POA: Diagnosis not present

## 2018-10-17 DIAGNOSIS — E782 Mixed hyperlipidemia: Secondary | ICD-10-CM | POA: Diagnosis not present

## 2018-10-17 DIAGNOSIS — I10 Essential (primary) hypertension: Secondary | ICD-10-CM | POA: Diagnosis not present

## 2018-10-17 DIAGNOSIS — D631 Anemia in chronic kidney disease: Secondary | ICD-10-CM | POA: Diagnosis not present

## 2018-10-17 MED ORDER — GLIPIZIDE 5 MG PO TABS: 5.0000 mg | ORAL_TABLET | Freq: Two times a day (BID) | ORAL | 5 refills | Status: AC

## 2018-10-17 MED ORDER — CARVEDILOL 12.5 MG PO TABS: 12.5000 mg | ORAL_TABLET | Freq: Two times a day (BID) | ORAL | 5 refills | Status: AC

## 2018-10-17 MED ORDER — ATORVASTATIN CALCIUM 80 MG PO TABS: 80.0000 mg | ORAL_TABLET | Freq: Every morning | ORAL | 5 refills | Status: AC

## 2018-10-17 NOTE — Progress Notes (Signed)
Date:  10/17/2018   Name:  Jake Gomez   DOB:  1971-02-04   MRN:  [id number]   Chief Complaint: Hypertension; Hyperlipidemia; and Diabetes  Hypertension  This is a chronic problem. The current episode started more than 1 year ago. The problem is unchanged. The problem is controlled. Pertinent negatives include no anxiety, blurred vision, chest pain, headaches, malaise/fatigue, neck pain, orthopnea, palpitations, peripheral edema, PND, shortness of breath or sweats. There are no associated agents to hypertension. There are no known risk factors for coronary artery disease. Past treatments include angiotensin blockers, calcium channel blockers and beta blockers. The current treatment provides moderate improvement. There are no compliance problems.  There is no history of angina, kidney disease, CAD/MI, CVA, heart failure, left ventricular hypertrophy, PVD or retinopathy. There is no history of chronic renal disease, a hypertension causing med or renovascular disease.  Hyperlipidemia  This is a chronic problem. The current episode started more than 1 year ago. The problem is controlled. Recent lipid tests were reviewed and are normal. He has no history of chronic renal disease, diabetes, hypothyroidism, liver disease, obesity or nephrotic syndrome. There are no known factors aggravating his hyperlipidemia. Pertinent negatives include no chest pain or shortness of breath. Current antihyperlipidemic treatment includes statins. The current treatment provides moderate improvement of lipids. There are no compliance problems.  Risk factors for coronary artery disease include dyslipidemia, hypertension and male sex.  Diabetes  He presents for his follow-up diabetic visit. He has type 2 diabetes mellitus. His disease course has been stable. Pertinent negatives for hypoglycemia include no confusion, dizziness, headaches, hunger, mood changes, nervousness/anxiousness, pallor, seizures, sleepiness, speech  difficulty, sweats or tremors. Pertinent negatives for diabetes include no blurred vision, no chest pain, no fatigue, no foot paresthesias, no foot ulcerations, no polydipsia, no polyphagia, no polyuria, no visual change, no weakness and no weight loss. There are no hypoglycemic complications. Symptoms are stable. There are no diabetic complications. Pertinent negatives for diabetic complications include no CVA, PVD or retinopathy. Risk factors for coronary artery disease include diabetes mellitus, hypertension, male sex and dyslipidemia. He is compliant with treatment most of the time. His weight is stable. He is following a generally healthy diet. He rarely participates in exercise. An ACE inhibitor/angiotensin II receptor blocker is being taken. He does not see a podiatrist.Eye exam is not current.    Review of Systems  Constitutional: Negative for fatigue, malaise/fatigue and weight loss.  Eyes: Negative for blurred vision.  Respiratory: Negative for shortness of breath.   Cardiovascular: Negative for chest pain, palpitations, orthopnea and PND.  Endocrine: Negative for polydipsia, polyphagia and polyuria.  Musculoskeletal: Negative for neck pain.  Skin: Negative for pallor.  Neurological: Negative for dizziness, tremors, seizures, speech difficulty, weakness and headaches.  Psychiatric/Behavioral: Negative for confusion. The patient is not nervous/anxious.     Patient Active Problem List   Diagnosis Date Noted   Sepsis ([place]) 07/11/2018   SBP (spontaneous bacterial peritonitis) ([place]) 05/25/2018   Intractable abdominal pain 05/02/2018   Peritonitis ([place]) 05/02/2018   Chest pain 05/26/2017   ESRD on peritoneal dialysis ([place]) 05/26/2017   Chronic renal disease, stage 4, severely decreased glomerular filtration rate between 15-29 mL/min/1.73 square meter ([place]) 01/30/2014   Acute-on-chronic renal failure ([place]) 09/29/2013   Diabetes mellitus ([place]) 09/29/2013   Hyperkalemia,  diminished renal excretion 09/29/2013   Hypertension 09/29/2013    No Known Allergies  Past Surgical History:  Procedure Laterality Date   DIALYSIS/PERMA CATHETER INSERTION N/A 07/19/2018  Procedure: DIALYSIS/PERMA CATHETER INSERTION;  Surgeon: Algernon Huxley, MD;  Location: [place] [facility];  Service: Cardiovascular;  Laterality: N/A;   DIALYSIS/PERMA CATHETER INSERTION N/A 09/13/2018   Procedure: DIALYSIS/PERMA CATHETER INSERTION;  Surgeon: Katha Cabal, MD;  Location: [place] [facility];  Service: Cardiovascular;  Laterality: N/A;   peritonal dialysis     REMOVAL OF A DIALYSIS CATHETER N/A 07/17/2018   Procedure: REMOVAL OF A DIALYSIS CATHETER;  Surgeon: Algernon Huxley, MD;  Location: [facility];  Service: Vascular;  Laterality: N/A;    Social History   Tobacco Use   Smoking status: Former Smoker   Smokeless tobacco: Former Systems developer    Quit date: 07/16/2009  Substance Use Topics   Alcohol use: No    Frequency: Never   Drug use: No     Medication list has been reviewed and updated.  Current Meds  Medication Sig   acetaminophen (TYLENOL) 325 MG tablet Take 1,000 mg by mouth every 6 (six) hours as needed for mild pain.   amLODipine (NORVASC) 10 MG tablet Take 1 tablet (10 mg total) by mouth daily. (Patient taking differently: Take 10 mg by mouth daily. 10mg  at night and 5mg  in the am- nephrology)   atorvastatin (LIPITOR) 80 MG tablet Take 1 tablet (80 mg total) by mouth every morning.   calcium acetate (PHOSLO) 667 MG capsule Take 2,001 mg by mouth 3 (three) times daily with meals.    carvedilol (COREG) 12.5 MG tablet Take 1 tablet (12.5 mg total) by mouth 2 (two) times daily with a meal.   docusate sodium (COLACE) 100 MG capsule Take 1 capsule (100 mg total) by mouth 2 (two) times daily.   folic acid (FOLVITE) 1 MG tablet Take 1 tablet (1 mg total) by mouth daily.   gabapentin (NEURONTIN) 300 MG capsule Take 300 mg by mouth daily.   glipiZIDE (GLUCOTROL) 5 MG  tablet TAKE 1 TABLET BY MOUTH ONCE DAILY BEFORE BREAKFAST (Patient taking differently: Take 5 mg by mouth 2 (two) times daily before a meal. )   hydrALAZINE (APRESOLINE) 25 MG tablet Take 25 mg by mouth 3 (three) times daily. nephrology   insulin regular (NOVOLIN R RELION) 100 units/mL injection Inject subcutaneously daily as directed on the following sliding scale: 2 units for blood glucose reading equal to or greater than 150 and 1 unit for each blood glucose reading of 50.   Insulin Syringes, Disposable, U-100 1 ML MISC 1 each by Does not apply route daily.   irbesartan (AVAPRO) 300 MG tablet Take 300 mg by mouth at bedtime.    polyethylene glycol (MIRALAX / GLYCOLAX) packet Take 17 g by mouth daily.   RELION INSULIN SYRINGE 1ML/31G 31G X 5/16" 1 ML MISC 1 EACH BY DOSE NOT APPLY ROUTE DAILY   torsemide (DEMADEX) 100 MG tablet Take 100 mg by mouth daily.   traZODone (DESYREL) 50 MG tablet Take 50 mg by mouth at bedtime. Dr Candiss Norse   vitamin B-12 1000 MCG tablet Take 1 tablet (1,000 mcg total) by mouth daily.   [DISCONTINUED] fluticasone (FLONASE) 50 MCG/ACT nasal spray Place 2 sprays into both nostrils daily.   Current Facility-Administered Medications for the 10/17/18 encounter (Office Visit) with Juline Patch, MD  Medication   alteplase (ACTIVASE) 5 mg in sodium chloride 0.9 % 50 mL (0.1 mg/mL) infusion   alteplase (ACTIVASE) 5 mg in sodium chloride 0.9 % 50 mL (0.1 mg/mL) infusion   alteplase (ACTIVASE) 5 mg in sodium chloride 0.9 % 50  mL (0.1 mg/mL) infusion   alteplase (ACTIVASE) 5 mg in sodium chloride 0.9 % 50 mL (0.1 mg/mL) infusion   alteplase (ACTIVASE) 5 mg in sodium chloride 0.9 % 50 mL (0.1 mg/mL) infusion   alteplase (ACTIVASE) 5 mg in sodium chloride 0.9 % 50 mL (0.1 mg/mL) infusion    PHQ 2/9 Scores 10/17/2018 05/24/2018 08/05/2017  PHQ - 2 Score 0 2 0  PHQ- 9 Score 2 12 0    BP Readings from Last 3 Encounters:  10/17/18 124/82  09/13/18 (!) 211/89    07/22/18 (!) 160/69    Physical Exam  Wt Readings from Last 3 Encounters:  10/17/18 231 lb (104.8 kg)  09/13/18 249 lb 1.9 oz (113 kg)  07/21/18 249 lb 1.9 oz (113 kg)    BP 124/82    Pulse 68    Ht 5\' 8"  (1.727 m)    Wt 231 lb (104.8 kg)    BMI 35.12 kg/m   Assessment and Plan: 1. Mixed hyperlipidemia Chronic.  Controlled.  Continue atorvastatin 80 mg once a day. - atorvastatin (LIPITOR) 80 MG tablet; Take 1 tablet (80 mg total) by mouth every morning.  Dispense: 30 tablet; Refill: 5  2. Type 2 diabetes mellitus with microalbuminuria, with long-term current use of insulin (HCC) Chronic.  Questionably controlled.  Patient has improved his compliance with taking of medication.  We will check an A1c since patient does not check his daily glucose at all.  Patient will continue glipizide 5 mg 1 twice a day.  Patient will also continue his regular insulin which is a sliding scale of 2 units for blood glucose reading greater to or equal to 150 and 1 unit for each glucose reading of 50 there above.  I am not certain if he is doing this on a regular basis or neglecting this since he does not check his own blood sugars on a regular basis. - HgB A1c - glipiZIDE (GLUCOTROL) 5 MG tablet; Take 1 tablet (5 mg total) by mouth 2 (two) times daily before a meal.  Dispense: 60 tablet; Refill: 5  3. Essential hypertension Chronic.  Controlled.  I will refill his carvedilol 12.5 mg twice a day.  Patient will also continue his amlodipine and Demadex and hydralazine and irbesartan as prescribed by Dr. Ulice Bold. - carvedilol (COREG) 12.5 MG tablet; Take 1 tablet (12.5 mg total) by mouth 2 (two) times daily with a meal.  Dispense: 60 tablet; Refill: 5

## 2018-10-18 LAB — HEMOGLOBIN A1C
Est. average glucose Bld gHb Est-mCnc: 108 mg/dL
Hgb A1c MFr Bld: 5.4 % (ref 4.8–5.6)

## 2018-10-19 DIAGNOSIS — Z992 Dependence on renal dialysis: Secondary | ICD-10-CM | POA: Diagnosis not present

## 2018-10-19 DIAGNOSIS — Z23 Encounter for immunization: Secondary | ICD-10-CM | POA: Diagnosis not present

## 2018-10-19 DIAGNOSIS — D631 Anemia in chronic kidney disease: Secondary | ICD-10-CM | POA: Diagnosis not present

## 2018-10-19 DIAGNOSIS — D509 Iron deficiency anemia, unspecified: Secondary | ICD-10-CM | POA: Diagnosis not present

## 2018-10-19 DIAGNOSIS — N186 End stage renal disease: Secondary | ICD-10-CM | POA: Diagnosis not present

## 2018-10-20 DIAGNOSIS — D631 Anemia in chronic kidney disease: Secondary | ICD-10-CM | POA: Diagnosis not present

## 2018-10-20 DIAGNOSIS — N186 End stage renal disease: Secondary | ICD-10-CM | POA: Diagnosis not present

## 2018-10-20 DIAGNOSIS — E8770 Fluid overload, unspecified: Secondary | ICD-10-CM | POA: Diagnosis not present

## 2018-10-20 DIAGNOSIS — Z992 Dependence on renal dialysis: Secondary | ICD-10-CM | POA: Diagnosis not present

## 2018-10-20 DIAGNOSIS — E8779 Other fluid overload: Secondary | ICD-10-CM | POA: Diagnosis not present

## 2018-10-21 DIAGNOSIS — Z23 Encounter for immunization: Secondary | ICD-10-CM | POA: Diagnosis not present

## 2018-10-21 DIAGNOSIS — N186 End stage renal disease: Secondary | ICD-10-CM | POA: Diagnosis not present

## 2018-10-21 DIAGNOSIS — D631 Anemia in chronic kidney disease: Secondary | ICD-10-CM | POA: Diagnosis not present

## 2018-10-21 DIAGNOSIS — Z992 Dependence on renal dialysis: Secondary | ICD-10-CM | POA: Diagnosis not present

## 2018-10-21 DIAGNOSIS — D509 Iron deficiency anemia, unspecified: Secondary | ICD-10-CM | POA: Diagnosis not present

## 2018-10-24 DIAGNOSIS — Z992 Dependence on renal dialysis: Secondary | ICD-10-CM | POA: Diagnosis not present

## 2018-10-24 DIAGNOSIS — Z23 Encounter for immunization: Secondary | ICD-10-CM | POA: Diagnosis not present

## 2018-10-24 DIAGNOSIS — N186 End stage renal disease: Secondary | ICD-10-CM | POA: Diagnosis not present

## 2018-10-24 DIAGNOSIS — D509 Iron deficiency anemia, unspecified: Secondary | ICD-10-CM | POA: Diagnosis not present

## 2018-10-24 DIAGNOSIS — D631 Anemia in chronic kidney disease: Secondary | ICD-10-CM | POA: Diagnosis not present

## 2018-10-25 ENCOUNTER — Telehealth (INDEPENDENT_AMBULATORY_CARE_PROVIDER_SITE_OTHER): Payer: Medicare Other | Admitting: Cardiovascular Disease

## 2018-10-25 ENCOUNTER — Telehealth: Payer: Self-pay | Admitting: Cardiovascular Disease

## 2018-10-25 ENCOUNTER — Other Ambulatory Visit: Payer: Self-pay

## 2018-10-25 DIAGNOSIS — I2584 Coronary atherosclerosis due to calcified coronary lesion: Secondary | ICD-10-CM | POA: Diagnosis not present

## 2018-10-25 DIAGNOSIS — Z794 Long term (current) use of insulin: Secondary | ICD-10-CM | POA: Diagnosis not present

## 2018-10-25 DIAGNOSIS — K652 Spontaneous bacterial peritonitis: Secondary | ICD-10-CM | POA: Diagnosis not present

## 2018-10-25 DIAGNOSIS — R079 Chest pain, unspecified: Secondary | ICD-10-CM

## 2018-10-25 DIAGNOSIS — I2510 Atherosclerotic heart disease of native coronary artery without angina pectoris: Secondary | ICD-10-CM

## 2018-10-25 DIAGNOSIS — E1122 Type 2 diabetes mellitus with diabetic chronic kidney disease: Secondary | ICD-10-CM | POA: Diagnosis not present

## 2018-10-25 DIAGNOSIS — I10 Essential (primary) hypertension: Secondary | ICD-10-CM

## 2018-10-25 DIAGNOSIS — Z992 Dependence on renal dialysis: Secondary | ICD-10-CM | POA: Diagnosis not present

## 2018-10-25 DIAGNOSIS — N186 End stage renal disease: Secondary | ICD-10-CM | POA: Diagnosis not present

## 2018-10-25 NOTE — Progress Notes (Signed)
Virtual Visit via Video Note   This visit type was conducted due to national recommendations for restrictions regarding the COVID-19 Pandemic (e.g. social distancing) in an effort to limit this patient's exposure and mitigate transmission in our community.  Due to his co-morbid illnesses, this patient is at least at moderate risk for complications without adequate follow up.  This format is felt to be most appropriate for this patient at this time.  All issues noted in this document were discussed and addressed.  A limited physical exam was performed with this format.  Please refer to the patient's chart for his consent to telehealth for [facility].   I connected with  Jake Gomez on 10/25/18 by a video enabled telemedicine application and verified that I am speaking with the correct person using two identifiers. I discussed the limitations of evaluation and management by telemedicine. The patient expressed understanding and agreed to proceed.   Evaluation Performed:  Follow-up visit  Date:  10/25/2018   ID:  Jake Gomez, DOB [birth year], MRN [id number]  Patient Location:  [facility] [place] [address]   Provider location:   [facility] [place] office  PCP:  Juline Patch, MD  Cardiologist:  Patsy Baltimore   Chief Complaint:      History of Present Illness:    Jake Gomez is a 48 y.o. male who presents via audio/video conferencing for a telehealth visit today.   The patient does not symptoms concerning for COVID-19 infection (fever, chills, cough, or new SHORTNESS OF BREATH).   Patient has a past medical history of End-stage renal disease on hemodialysis Diabetes type 2 with complications SBP November 2019 Pseudomonas , December 2019 and January 2020 Hypertension Hyperlipidemia Chronic anemia Referred to cardiology clinic by Dr. Ronalee Belts   Previous referral placed end of 2019 for preop cardiovascular evaluation He declined appointments  until recently  recurrent peritonitis was clouded by problems with the drainage from the PD catheter and by constipation both of which may be contributing to the patient's abdominal pain   Notes indicating Multiple revisions of his peritoneal dialysis access Was scheduled to have fistula placed left arm  Hemoglobin A1c typically runs 7 up to 8 more recently down to 5.4 No recent lipid panel available  Hematocrit 26.4 back in February 2020 In January 34.4  Reports that he just walked 2 hrs [employer] Also helps take care of 2 grandchildren who are very active No anginal symptoms  Prior CV studies:   The following studies were reviewed today:  No prior cardiac work-up available  Echo 08/2017 Left ventricular hypertrophy  Normal left ventricular systolic function, ejection fraction 60 to 65%  Thickened mitral valve with annular calcification  Dilated left atrium - mild  Probably normal right ventricular systolic function  CT chest 1. Enlarging large left pleural effusion with near complete atelectasis of the left lung. 2. Atherosclerosis including left anterior descending coronary artery disease. 3. Cirrhosis with moderate volume of ascites. 4. Cholelithiasis.  ekg 04/2018 NORMAL SINUS RHYTHM Normal  Past Medical History:  Diagnosis Date  . Diabetes mellitus without complication ([place])   . Hypertension   . Renal disorder    Past Surgical History:  Procedure Laterality Date  . DIALYSIS/PERMA CATHETER INSERTION N/A 07/19/2018   Procedure: DIALYSIS/PERMA CATHETER INSERTION;  Surgeon: Algernon Huxley, MD;  Location: [place] [facility];  Service: Cardiovascular;  Laterality: N/A;  . DIALYSIS/PERMA CATHETER INSERTION N/A 09/13/2018   Procedure: DIALYSIS/PERMA CATHETER INSERTION;  Surgeon: Katha Cabal, MD;  Location: [place] [facility];  Service: Cardiovascular;  Laterality: N/A;  . peritonal dialysis    . REMOVAL OF A DIALYSIS CATHETER N/A 07/17/2018   Procedure: REMOVAL  OF A DIALYSIS CATHETER;  Surgeon: Algernon Huxley, MD;  Location: [facility] ORS;  Service: Vascular;  Laterality: N/A;     No outpatient medications have been marked as taking for the 10/25/18 encounter (Telemedicine) with Minna Merritts, MD.   Current Facility-Administered Medications for the 10/25/18 encounter (Telemedicine) with Minna Merritts, MD  Medication  . alteplase (ACTIVASE) 5 mg in sodium chloride 0.9 % 50 mL (0.1 mg/mL) infusion  . alteplase (ACTIVASE) 5 mg in sodium chloride 0.9 % 50 mL (0.1 mg/mL) infusion  . alteplase (ACTIVASE) 5 mg in sodium chloride 0.9 % 50 mL (0.1 mg/mL) infusion  . alteplase (ACTIVASE) 5 mg in sodium chloride 0.9 % 50 mL (0.1 mg/mL) infusion  . alteplase (ACTIVASE) 5 mg in sodium chloride 0.9 % 50 mL (0.1 mg/mL) infusion  . alteplase (ACTIVASE) 5 mg in sodium chloride 0.9 % 50 mL (0.1 mg/mL) infusion     Allergies:   Patient has no known allergies.   Social History   Tobacco Use  . Smoking status: Former Research scientist (life sciences)  . Smokeless tobacco: Former Systems developer    Quit date: 07/16/2009  Substance Use Topics  . Alcohol use: No    Frequency: Never  . Drug use: No     Current Outpatient Medications on File Prior to Visit  Medication Sig Dispense Refill  . acetaminophen (TYLENOL) 325 MG tablet Take 1,000 mg by mouth every 6 (six) hours as needed for mild pain.    Marland Kitchen amLODipine (NORVASC) 10 MG tablet Take 1 tablet (10 mg total) by mouth daily. (Patient taking differently: Take 10 mg by mouth daily. 10mg  at night and 5mg  in the am- nephrology) 30 tablet 0  . atorvastatin (LIPITOR) 80 MG tablet Take 1 tablet (80 mg total) by mouth every morning. 30 tablet 5  . calcium acetate (PHOSLO) 667 MG capsule Take 2,001 mg by mouth 3 (three) times daily with meals.     . carvedilol (COREG) 12.5 MG tablet Take 1 tablet (12.5 mg total) by mouth 2 (two) times daily with a meal. 60 tablet 5  . docusate sodium (COLACE) 100 MG capsule Take 1 capsule (100 mg total) by mouth 2 (two) times  daily. 10 capsule 0  . folic acid (FOLVITE) 1 MG tablet Take 1 tablet (1 mg total) by mouth daily. 30 tablet 0  . gabapentin (NEURONTIN) 300 MG capsule Take 300 mg by mouth daily.    Marland Kitchen glipiZIDE (GLUCOTROL) 5 MG tablet Take 1 tablet (5 mg total) by mouth 2 (two) times daily before a meal. 60 tablet 5  . hydrALAZINE (APRESOLINE) 25 MG tablet Take 25 mg by mouth 3 (three) times daily. nephrology    . insulin regular (NOVOLIN R RELION) 100 units/mL injection Inject subcutaneously daily as directed on the following sliding scale: 2 units for blood glucose reading equal to or greater than 150 and 1 unit for each blood glucose reading of 50. 10 mL 4  . Insulin Syringes, Disposable, U-100 1 ML MISC 1 each by Does not apply route daily. 100 each 0  . irbesartan (AVAPRO) 300 MG tablet Take 300 mg by mouth at bedtime.     . isosorbide mononitrate (IMDUR) 30 MG 24 hr tablet Take 1 tablet (30 mg total) by mouth daily. (Patient not taking:  Reported on 10/17/2018) 30 tablet 0  . polyethylene glycol (MIRALAX / GLYCOLAX) packet Take 17 g by mouth daily. 14 each 0  . RELION INSULIN SYRINGE 1ML/31G 31G X 5/16" 1 ML MISC 1 EACH BY DOSE NOT APPLY ROUTE DAILY  0  . torsemide (DEMADEX) 100 MG tablet Take 100 mg by mouth daily.    . traZODone (DESYREL) 50 MG tablet Take 50 mg by mouth at bedtime. Dr Candiss Norse  0  . vitamin B-12 1000 MCG tablet Take 1 tablet (1,000 mcg total) by mouth daily. 30 tablet 0   Current Facility-Administered Medications on File Prior to Visit  Medication Dose Route Frequency Provider Last Rate Last Dose  . alteplase (ACTIVASE) 5 mg in sodium chloride 0.9 % 50 mL (0.1 mg/mL) infusion  5 mg Intravenous Once Stegmayer, Kimberly A, PA-C      . alteplase (ACTIVASE) 5 mg in sodium chloride 0.9 % 50 mL (0.1 mg/mL) infusion  5 mg Intravenous Once Stegmayer, Kimberly A, PA-C      . alteplase (ACTIVASE) 5 mg in sodium chloride 0.9 % 50 mL (0.1 mg/mL) infusion  5 mg Intravenous Once Eulogio Ditch E, NP      .  alteplase (ACTIVASE) 5 mg in sodium chloride 0.9 % 50 mL (0.1 mg/mL) infusion  5 mg Intravenous Once Eulogio Ditch E, NP      . alteplase (ACTIVASE) 5 mg in sodium chloride 0.9 % 50 mL (0.1 mg/mL) infusion  5 mg Intravenous Once Eulogio Ditch E, NP      . alteplase (ACTIVASE) 5 mg in sodium chloride 0.9 % 50 mL (0.1 mg/mL) infusion  5 mg Intravenous Once Kris Hartmann, NP         Family Hx: The patient's family history includes Cancer in his father; Diabetes in his father; Stroke in his father; Thyroid disease in his mother.  ROS:   Please see the history of present illness.    Review of Systems  Constitutional: Negative.   Respiratory: Negative.   Cardiovascular: Negative.   Gastrointestinal: Negative.   Musculoskeletal: Negative.   Neurological: Negative.   Psychiatric/Behavioral: Negative.   All other systems reviewed and are negative.     Labs/Other Tests and Data Reviewed:    Recent Labs: 07/10/2018: ALT 56; TSH 2.575 07/19/2018: BUN 84; Creatinine, Ser 14.13; Magnesium 2.4; Potassium 4.3; Sodium 131 07/20/2018: Hemoglobin 8.2; Platelets 291   Recent Lipid Panel No results found for: CHOL, TRIG, HDL, CHOLHDL, LDLCALC, LDLDIRECT  Wt Readings from Last 3 Encounters:  10/17/18 231 lb (104.8 kg)  09/13/18 249 lb 1.9 oz (113 kg)  07/21/18 249 lb 1.9 oz (113 kg)     Exam:    Vital Signs: Vital signs may also be detailed in the HPI There were no vitals taken for this visit.  Wt Readings from Last 3 Encounters:  10/17/18 231 lb (104.8 kg)  09/13/18 249 lb 1.9 oz (113 kg)  07/21/18 249 lb 1.9 oz (113 kg)   Temp Readings from Last 3 Encounters:  09/13/18 98.2 F (36.8 C) (Oral)  07/22/18 98.4 F (36.9 C) (Oral)  05/27/18 97.9 F (36.6 C) (Oral)   BP Readings from Last 3 Encounters:  10/17/18 124/82  09/13/18 (!) 211/89  07/22/18 (!) 160/69   Pulse Readings from Last 3 Encounters:  10/17/18 68  09/13/18 74  07/22/18 81    120/80 resp 16 Pulse 41  Well  nourished, well developed male in no acute distress. Constitutional:  oriented to person, place, and  time. No distress.  Head: Normocephalic and atraumatic.  Eyes:  no discharge. No scleral icterus.  Neck: Normal range of motion. Neck supple.  Pulmonary/Chest: No audible wheezing, no distress, appears comfortable Musculoskeletal: Normal range of motion.  no  tenderness or deformity.  Neurological:   Coordination normal. Full exam not performed Skin:  No rash Psychiatric:  normal mood and affect. behavior is normal. Thought content normal.    ASSESSMENT & PLAN:    Preop cardiovascular clearance Denies any significant chest pain Good exercise tolerance, no symptoms concerning for angina Prior echocardiogram done 2019 normal ejection fraction Normal EKG done November 2019 3 [facility] Denies any changes in his status since that time, no symptoms concerning for angina He does have coronary calcification noted on CT scan chest Stressed importance of aggressive diabetes control He is on a statin Given very active, no symptoms, acceptable risk for upcoming procedure with vascular surgery  Type 2 diabetes mellitus with chronic kidney disease on chronic dialysis, with long-term current use of insulin (HCC) Dramatic improvement in his hemoglobin A1c now down in the 5 range Reports that he eats what he wants, just had a hot dog  ESRD on peritoneal dialysis [facility]) Being scheduled for graft placement left arm with vascular surgery SBP from peritoneal dialysis  Essential hypertension Blood pressure is well controlled on today's visit. No changes made to the medications. Does report some low blood pressure at the end of dialysis May need to hold hydralazine may be other medication morning of dialysis  SBP (spontaneous bacterial peritonitis) ([place]) Recent hospital records reviewed, long courses of antibiotics  Coronary artery calcification Seen on CT scan done through [facility] Stressed the importance of  aggressive diabetes control, LDL less than 70  COVID-19 Education: The signs and symptoms of COVID-19 were discussed with the patient and how to seek care for testing (follow up with PCP or arrange E-visit).  The importance of social distancing was discussed today.  Patient Risk:   After full review of this patients clinical status, I feel that they are at least moderate risk at this time.  Time:   Today, I have spent 45 minutes with the patient with telehealth technology discussing the cardiac and medical problems/diagnoses detailed above   10 min spent reviewing the chart prior to patient visit today   Medication Adjustments/Labs and Tests Ordered: Current medicines are reviewed at length with the patient today.  Concerns regarding medicines are outlined above.   Tests Ordered: No tests ordered   Medication Changes: No changes made   Disposition: Follow-up as needed  Signed, Ida Rogue, MD  10/25/2018 2:33 PM    [place] Office [address] [place] [address], [place], [place] [address]

## 2018-10-25 NOTE — Telephone Encounter (Signed)
Virtual Visit Pre-Appointment Phone Call  "(Name), I am calling you today to discuss your upcoming appointment. We are currently trying to limit exposure to the virus that causes COVID-19 by seeing patients at home rather than in the office."  1. "What is the BEST phone number to call the day of the visit?" - include this in appointment notes  2. Do you have or have access to (through a family member/friend) a smartphone with video capability that we can use for your visit?" a. If yes - list this number in appt notes as cell (if different from BEST phone #) and list the appointment type as a VIDEO visit in appointment notes b. If no - list the appointment type as a PHONE visit in appointment notes  3. Confirm consent - "In the setting of the current Covid19 crisis, you are scheduled for a (phone or video) visit with your provider on (date) at (time).  Just as we do with many in-office visits, in order for you to participate in this visit, we must obtain consent.  If you'd like, I can send this to your mychart (if signed up) or email for you to review.  Otherwise, I can obtain your verbal consent now.  All virtual visits are billed to your insurance company just like a normal visit would be.  By agreeing to a virtual visit, we'd like you to understand that the technology does not allow for your provider to perform an examination, and thus may limit your provider's ability to fully assess your condition. If your provider identifies any concerns that need to be evaluated in person, we will make arrangements to do so.  Finally, though the technology is pretty good, we cannot assure that it will always work on either your or our end, and in the setting of a video visit, we may have to convert it to a phone-only visit.  In either situation, we cannot ensure that we have a secure connection.  Are you willing to proceed?" STAFF: Did the patient verbally acknowledge consent to telehealth visit? Document  YES/NO here: yes  4. Advise patient to be prepared - "Two hours prior to your appointment, go ahead and check your blood pressure, pulse, oxygen saturation, and your weight (if you have the equipment to check those) and write them all down. When your visit starts, your provider will ask you for this information. If you have an Apple Watch or Kardia device, please plan to have heart rate information ready on the day of your appointment. Please have a pen and paper handy nearby the day of the visit as well."  5. Give patient instructions for MyChart download to smartphone OR [email or web address] as below if video visit (depending on what platform provider is using)  6. Inform patient they will receive a phone call 15 minutes prior to their appointment time (may be from unknown caller ID) so they should be prepared to answer    TELEPHONE CALL NOTE  Jake Gomez has been deemed a candidate for a follow-up tele-health visit to limit community exposure during the Covid-19 pandemic. I spoke with the patient via phone to ensure availability of phone/video source, confirm preferred email & phone number, and discuss instructions and expectations.  I reminded Jake Gomez to be prepared with any vital sign and/or heart rhythm information that could potentially be obtained via home monitoring, at the time of his visit. I reminded Jake Gomez to expect a phone call prior to his visit.  Jake Gomez 10/25/2018 8:56 AM   INSTRUCTIONS FOR DOWNLOADING THE MYCHART APP TO SMARTPHONE  - The patient must first make sure to have activated MyChart and know their login information - If Apple, go to [employer] and type in MyChart in the search bar and download the app. If Android, ask patient to go to [employer] and type in [facility] in the search bar and download the app. The app is free but as with any other app downloads, their phone may require them to verify saved payment information or Apple/Android  password.  - The patient will need to then log into the app with their MyChart username and password, and select [facility] as their healthcare provider to link the account. When it is time for your visit, go to the MyChart app, find appointments, and click Begin Video Visit. Be sure to Select Allow for your device to access the Microphone and Camera for your visit. You will then be connected, and your provider will be with you shortly.  **If they have any issues connecting, or need assistance please contact MyChart service desk [phone])**  **If using a computer, in order to ensure the best quality for their visit they will need to use either of the following Internet Browsers: [employer], or Google Chrome**  IF USING DOXIMITY or DOXY.ME - The patient will receive a link just prior to their visit by text.     FULL LENGTH CONSENT FOR TELE-HEALTH VISIT   I hereby voluntarily request, consent and authorize [facility] and its employed or contracted physicians, physician assistants, nurse practitioners or other licensed health care professionals (the Practitioner), to provide me with telemedicine health care services (the Services") as deemed necessary by the treating Practitioner. I acknowledge and consent to receive the Services by the Practitioner via telemedicine. I understand that the telemedicine visit will involve communicating with the Practitioner through live audiovisual communication technology and the disclosure of certain medical information by electronic transmission. I acknowledge that I have been given the opportunity to request an in-person assessment or other available alternative prior to the telemedicine visit and am voluntarily participating in the telemedicine visit.  I understand that I have the right to withhold or withdraw my consent to the use of telemedicine in the course of my care at any time, without affecting my right to future care or treatment,  and that the Practitioner or I may terminate the telemedicine visit at any time. I understand that I have the right to inspect all information obtained and/or recorded in the course of the telemedicine visit and may receive copies of available information for a reasonable fee.  I understand that some of the potential risks of receiving the Services via telemedicine include:   Delay or interruption in medical evaluation due to technological equipment failure or disruption;  Information transmitted may not be sufficient (e.g. poor resolution of images) to allow for appropriate medical decision making by the Practitioner; and/or   In rare instances, security protocols could fail, causing a breach of personal health information.  Furthermore, I acknowledge that it is my responsibility to provide information about my medical history, conditions and care that is complete and accurate to the best of my ability. I acknowledge that Practitioner's advice, recommendations, and/or decision may be based on factors not within their control, such as incomplete or inaccurate data provided by me or distortions of diagnostic images or specimens that may result from electronic transmissions. I understand that the  practice of medicine is not an Chief Strategy Officer and that Practitioner makes no warranties or guarantees regarding treatment outcomes. I acknowledge that I will receive a copy of this consent concurrently upon execution via email to the email address I last provided but may also request a printed copy by calling the office of [facility].    I understand that my insurance will be billed for this visit.   I have read or had this consent read to me.  I understand the contents of this consent, which adequately explains the benefits and risks of the Services being provided via telemedicine.   I have been provided ample opportunity to ask questions regarding this consent and the Services and have had my questions  answered to my satisfaction.  I give my informed consent for the services to be provided through the use of telemedicine in my medical care  By participating in this telemedicine visit I agree to the above.

## 2018-10-25 NOTE — Patient Instructions (Addendum)
Medication Instructions:  No changes  If you need a refill on your cardiac medications before your next appointment, please call your pharmacy.    Lab work: No new labs needed   If you have labs (blood work) drawn today and your tests are completely normal, you will receive your results only by: Marland Kitchen MyChart Message (if you have MyChart) OR . A paper copy in the mail If you have any lab test that is abnormal or we need to change your treatment, we will call you to review the results.   Testing/Procedures: No new testing needed   Follow-Up: At [facility], you and your health needs are our priority.  As part of our continuing mission to provide you with exceptional heart care, we have created designated Provider Care Teams.  These Care Teams include your primary Cardiologist (physician) and Advanced Practice Providers (APPs -  Physician Assistants and Nurse Practitioners) who all work together to provide you with the care you need, when you need it.  . You will need a follow up appointment as needed .   Please call our office 2 months in advance to schedule this appointment.    . Providers on your designated Care Team:   . Murray Hodgkins, NP . Christell Faith, PA-C . Marrianne Mood, PA-C  Any Other Special Instructions Will Be Listed Below (If Applicable).  For educational health videos Log in to : [email or web address] Or : [email or web address], password : [email or web address]

## 2018-10-26 DIAGNOSIS — D509 Iron deficiency anemia, unspecified: Secondary | ICD-10-CM | POA: Diagnosis not present

## 2018-10-26 DIAGNOSIS — Z23 Encounter for immunization: Secondary | ICD-10-CM | POA: Diagnosis not present

## 2018-10-26 DIAGNOSIS — Z992 Dependence on renal dialysis: Secondary | ICD-10-CM | POA: Diagnosis not present

## 2018-10-26 DIAGNOSIS — D631 Anemia in chronic kidney disease: Secondary | ICD-10-CM | POA: Diagnosis not present

## 2018-10-26 DIAGNOSIS — N186 End stage renal disease: Secondary | ICD-10-CM | POA: Diagnosis not present

## 2018-10-28 DIAGNOSIS — D509 Iron deficiency anemia, unspecified: Secondary | ICD-10-CM | POA: Diagnosis not present

## 2018-10-28 DIAGNOSIS — Z23 Encounter for immunization: Secondary | ICD-10-CM | POA: Diagnosis not present

## 2018-10-28 DIAGNOSIS — Z992 Dependence on renal dialysis: Secondary | ICD-10-CM | POA: Diagnosis not present

## 2018-10-28 DIAGNOSIS — N186 End stage renal disease: Secondary | ICD-10-CM | POA: Diagnosis not present

## 2018-10-28 DIAGNOSIS — D631 Anemia in chronic kidney disease: Secondary | ICD-10-CM | POA: Diagnosis not present

## 2018-10-31 DIAGNOSIS — N186 End stage renal disease: Secondary | ICD-10-CM | POA: Diagnosis not present

## 2018-10-31 DIAGNOSIS — D631 Anemia in chronic kidney disease: Secondary | ICD-10-CM | POA: Diagnosis not present

## 2018-10-31 DIAGNOSIS — Z992 Dependence on renal dialysis: Secondary | ICD-10-CM | POA: Diagnosis not present

## 2018-10-31 DIAGNOSIS — Z23 Encounter for immunization: Secondary | ICD-10-CM | POA: Diagnosis not present

## 2018-10-31 DIAGNOSIS — D509 Iron deficiency anemia, unspecified: Secondary | ICD-10-CM | POA: Diagnosis not present

## 2018-11-01 DIAGNOSIS — Z992 Dependence on renal dialysis: Secondary | ICD-10-CM | POA: Diagnosis not present

## 2018-11-01 DIAGNOSIS — E8779 Other fluid overload: Secondary | ICD-10-CM | POA: Diagnosis not present

## 2018-11-01 DIAGNOSIS — N186 End stage renal disease: Secondary | ICD-10-CM | POA: Diagnosis not present

## 2018-11-01 DIAGNOSIS — E8770 Fluid overload, unspecified: Secondary | ICD-10-CM | POA: Diagnosis not present

## 2018-11-01 DIAGNOSIS — D631 Anemia in chronic kidney disease: Secondary | ICD-10-CM | POA: Diagnosis not present

## 2018-11-02 DIAGNOSIS — D509 Iron deficiency anemia, unspecified: Secondary | ICD-10-CM | POA: Diagnosis not present

## 2018-11-02 DIAGNOSIS — Z23 Encounter for immunization: Secondary | ICD-10-CM | POA: Diagnosis not present

## 2018-11-02 DIAGNOSIS — Z992 Dependence on renal dialysis: Secondary | ICD-10-CM | POA: Diagnosis not present

## 2018-11-02 DIAGNOSIS — D631 Anemia in chronic kidney disease: Secondary | ICD-10-CM | POA: Diagnosis not present

## 2018-11-02 DIAGNOSIS — N186 End stage renal disease: Secondary | ICD-10-CM | POA: Diagnosis not present

## 2018-11-04 DIAGNOSIS — D509 Iron deficiency anemia, unspecified: Secondary | ICD-10-CM | POA: Diagnosis not present

## 2018-11-04 DIAGNOSIS — D631 Anemia in chronic kidney disease: Secondary | ICD-10-CM | POA: Diagnosis not present

## 2018-11-04 DIAGNOSIS — Z23 Encounter for immunization: Secondary | ICD-10-CM | POA: Diagnosis not present

## 2018-11-04 DIAGNOSIS — Z992 Dependence on renal dialysis: Secondary | ICD-10-CM | POA: Diagnosis not present

## 2018-11-04 DIAGNOSIS — N186 End stage renal disease: Secondary | ICD-10-CM | POA: Diagnosis not present

## 2018-11-07 DIAGNOSIS — Z23 Encounter for immunization: Secondary | ICD-10-CM | POA: Diagnosis not present

## 2018-11-07 DIAGNOSIS — D509 Iron deficiency anemia, unspecified: Secondary | ICD-10-CM | POA: Diagnosis not present

## 2018-11-07 DIAGNOSIS — N186 End stage renal disease: Secondary | ICD-10-CM | POA: Diagnosis not present

## 2018-11-07 DIAGNOSIS — Z992 Dependence on renal dialysis: Secondary | ICD-10-CM | POA: Diagnosis not present

## 2018-11-07 DIAGNOSIS — D631 Anemia in chronic kidney disease: Secondary | ICD-10-CM | POA: Diagnosis not present

## 2018-11-08 DIAGNOSIS — E8770 Fluid overload, unspecified: Secondary | ICD-10-CM | POA: Diagnosis not present

## 2018-11-08 DIAGNOSIS — D631 Anemia in chronic kidney disease: Secondary | ICD-10-CM | POA: Diagnosis not present

## 2018-11-08 DIAGNOSIS — E8779 Other fluid overload: Secondary | ICD-10-CM | POA: Diagnosis not present

## 2018-11-08 DIAGNOSIS — Z992 Dependence on renal dialysis: Secondary | ICD-10-CM | POA: Diagnosis not present

## 2018-11-08 DIAGNOSIS — N186 End stage renal disease: Secondary | ICD-10-CM | POA: Diagnosis not present

## 2018-11-09 ENCOUNTER — Encounter (INDEPENDENT_AMBULATORY_CARE_PROVIDER_SITE_OTHER): Payer: Self-pay

## 2018-11-09 ENCOUNTER — Telehealth (INDEPENDENT_AMBULATORY_CARE_PROVIDER_SITE_OTHER): Payer: Self-pay

## 2018-11-09 DIAGNOSIS — D509 Iron deficiency anemia, unspecified: Secondary | ICD-10-CM | POA: Diagnosis not present

## 2018-11-09 DIAGNOSIS — D631 Anemia in chronic kidney disease: Secondary | ICD-10-CM | POA: Diagnosis not present

## 2018-11-09 DIAGNOSIS — Z992 Dependence on renal dialysis: Secondary | ICD-10-CM | POA: Diagnosis not present

## 2018-11-09 DIAGNOSIS — Z23 Encounter for immunization: Secondary | ICD-10-CM | POA: Diagnosis not present

## 2018-11-09 DIAGNOSIS — N186 End stage renal disease: Secondary | ICD-10-CM | POA: Diagnosis not present

## 2018-11-09 NOTE — Telephone Encounter (Signed)
Spoke with Jake Gomez at [facility] and gave her the pre-procedure instructions regarding the patient. All information will be faxed to [facility]  and given to the patient at his appt on Saturday.

## 2018-11-11 DIAGNOSIS — N186 End stage renal disease: Secondary | ICD-10-CM | POA: Diagnosis not present

## 2018-11-11 DIAGNOSIS — Z992 Dependence on renal dialysis: Secondary | ICD-10-CM | POA: Diagnosis not present

## 2018-11-11 DIAGNOSIS — D509 Iron deficiency anemia, unspecified: Secondary | ICD-10-CM | POA: Diagnosis not present

## 2018-11-11 DIAGNOSIS — Z23 Encounter for immunization: Secondary | ICD-10-CM | POA: Diagnosis not present

## 2018-11-11 DIAGNOSIS — D631 Anemia in chronic kidney disease: Secondary | ICD-10-CM | POA: Diagnosis not present

## 2018-11-12 ENCOUNTER — Other Ambulatory Visit (INDEPENDENT_AMBULATORY_CARE_PROVIDER_SITE_OTHER): Payer: Self-pay | Admitting: Nurse Practitioner

## 2018-11-12 DIAGNOSIS — Z992 Dependence on renal dialysis: Secondary | ICD-10-CM | POA: Diagnosis not present

## 2018-11-12 DIAGNOSIS — N186 End stage renal disease: Secondary | ICD-10-CM | POA: Diagnosis not present

## 2018-11-14 DIAGNOSIS — N186 End stage renal disease: Secondary | ICD-10-CM | POA: Diagnosis not present

## 2018-11-14 DIAGNOSIS — D509 Iron deficiency anemia, unspecified: Secondary | ICD-10-CM | POA: Diagnosis not present

## 2018-11-14 DIAGNOSIS — Z992 Dependence on renal dialysis: Secondary | ICD-10-CM | POA: Diagnosis not present

## 2018-11-14 DIAGNOSIS — N2581 Secondary hyperparathyroidism of renal origin: Secondary | ICD-10-CM | POA: Diagnosis not present

## 2018-11-14 DIAGNOSIS — D631 Anemia in chronic kidney disease: Secondary | ICD-10-CM | POA: Diagnosis not present

## 2018-11-14 DIAGNOSIS — Z23 Encounter for immunization: Secondary | ICD-10-CM | POA: Diagnosis not present

## 2018-11-14 NOTE — Telephone Encounter (Signed)
Patient called again today asking about when his surgery, pre-op and covid testing will be. Patient is scheduled for 11/16/2018 surgery and pre-op and covid testing on 11/15/2018.

## 2018-11-15 ENCOUNTER — Ambulatory Visit: Payer: Medicare Other | Admitting: Anesthesiology

## 2018-11-15 ENCOUNTER — Other Ambulatory Visit: Payer: Self-pay

## 2018-11-15 ENCOUNTER — Encounter
Admission: RE | Admit: 2018-11-15 | Discharge: 2018-11-15 | Disposition: A | Discharge status: Home / Self Care | Payer: Medicare Other | Source: Ambulatory Visit | Attending: Vascular Surgery | Admitting: Vascular Surgery

## 2018-11-15 DIAGNOSIS — Z1159 Encounter for screening for other viral diseases: Secondary | ICD-10-CM | POA: Diagnosis not present

## 2018-11-15 DIAGNOSIS — Z992 Dependence on renal dialysis: Secondary | ICD-10-CM | POA: Diagnosis not present

## 2018-11-15 DIAGNOSIS — E8779 Other fluid overload: Secondary | ICD-10-CM | POA: Diagnosis not present

## 2018-11-15 DIAGNOSIS — N186 End stage renal disease: Secondary | ICD-10-CM | POA: Diagnosis not present

## 2018-11-15 DIAGNOSIS — Z5309 Procedure and treatment not carried out because of other contraindication: Secondary | ICD-10-CM | POA: Diagnosis not present

## 2018-11-15 LAB — BASIC METABOLIC PANEL
Anion gap: 16 — ABNORMAL HIGH (ref 5–15)
BUN: 64 mg/dL — ABNORMAL HIGH (ref 6–20)
CO2: 27 mmol/L (ref 22–32)
Calcium: 9 mg/dL (ref 8.9–10.3)
Chloride: 96 mmol/L — ABNORMAL LOW (ref 98–111)
Creatinine, Ser: 9.49 mg/dL — ABNORMAL HIGH (ref 0.61–1.24)
GFR calc Af Amer: 7 mL/min — ABNORMAL LOW (ref >60)
GFR calc non Af Amer: 6 mL/min — ABNORMAL LOW (ref >60)
Glucose, Bld: 138 mg/dL — ABNORMAL HIGH (ref 70–99)
Potassium: 4.5 mmol/L (ref 3.5–5.1)
Sodium: 139 mmol/L (ref 135–145)

## 2018-11-15 LAB — TYPE AND SCREEN
ABO/RH(D): O POS
Antibody Screen: NEGATIVE

## 2018-11-15 LAB — CBC WITH DIFFERENTIAL/PLATELET
Abs Immature Granulocytes: 0.02 10*3/uL (ref 0.00–0.07)
Basophils Absolute: 0 10*3/uL (ref 0.0–0.1)
Basophils Relative: 0 %
Eosinophils Absolute: 0.3 10*3/uL (ref 0.0–0.5)
Eosinophils Relative: 4 %
HCT: 43 % (ref 39.0–52.0)
Hemoglobin: 13.3 g/dL (ref 13.0–17.0)
Immature Granulocytes: 0 %
Lymphocytes Relative: 22 %
Lymphs Abs: 1.7 10*3/uL (ref 0.7–4.0)
MCH: 30.4 pg (ref 26.0–34.0)
MCHC: 30.9 g/dL (ref 30.0–36.0)
MCV: 98.2 fL (ref 80.0–100.0)
Monocytes Absolute: 0.6 10*3/uL (ref 0.1–1.0)
Monocytes Relative: 8 %
Neutro Abs: 5.1 10*3/uL (ref 1.7–7.7)
Neutrophils Relative %: 66 %
Platelets: 194 10*3/uL (ref 150–400)
RBC: 4.38 MIL/uL (ref 4.22–5.81)
RDW: 16.6 % — ABNORMAL HIGH (ref 11.5–15.5)
WBC: 7.8 10*3/uL (ref 4.0–10.5)
nRBC: 0 % (ref 0.0–0.2)

## 2018-11-15 LAB — APTT: aPTT: 31 seconds (ref 24–36)

## 2018-11-15 LAB — SARS CORONAVIRUS 2 BY RT PCR (HOSPITAL ORDER, PERFORMED IN ~~LOC~~ HOSPITAL LAB): SARS Coronavirus 2: NEGATIVE

## 2018-11-15 LAB — PROTIME-INR
INR: 1 (ref 0.8–1.2)
Prothrombin Time: 13.1 seconds (ref 11.4–15.2)

## 2018-11-15 MED ORDER — CEFAZOLIN SODIUM-DEXTROSE 1-4 GM/50ML-% IV SOLN: 1.0000 g | INTRAVENOUS | Status: DC

## 2018-11-15 NOTE — Patient Instructions (Signed)
Your procedure is scheduled on: 11/16/18 11:45 AM Report to Day Surgery. [facility]LOOR   Remember: Instructions that are not followed completely may result in serious medical risk,  up to and including death, or upon the discretion of your surgeon and anesthesiologist your  surgery may need to be rescheduled.     _X__ 1. Do not eat food after midnight the night before your procedure.                 No gum chewing or hard candies. You may drink clear liquids up to 2 hours                 before you are scheduled to arrive for your surgery- DO not drink clear                 liquids within 2 hours of the start of your surgery.                 Clear Liquids include:  water, apple juice without pulp, clear carbohydrate                 drink such as Clearfast of Gatorade, Black Coffee or Tea (Do not add                 anything to coffee or tea).  __X__2.  On the morning of surgery brush your teeth with toothpaste and water, you                may rinse your mouth with mouthwash if you wish.  Do not swallow any toothpaste of mouthwash.     _X__ 3.  No Alcohol for 24 hours before or after surgery.   _X__ 4.  Do Not Smoke or use e-cigarettes For 24 Hours Prior to Your Surgery.                 Do not use any chewable tobacco products for at least 6 hours prior to                 surgery.  ____  5.  Bring all medications with you on the day of surgery if instructed.   __X__  6.  Notify your doctor if there is any change in your medical condition      (cold, fever, infections).     Do not wear jewelry, make-up, hairpins, clips or nail polish. Do not wear lotions, powders, or perfumes. You may wear deodorant. Do not shave 48 hours prior to surgery. Men may shave face and neck. Do not bring valuables to the hospital.    [facility] is not responsible for any belongings or valuables.  Contacts, dentures or bridgework may not be worn into surgery. Leave your  suitcase in the car. After surgery it may be brought to your room. For patients admitted to the hospital, discharge time is determined by your treatment team.   Patients discharged the day of surgery will not be allowed to drive home.   Please read over the following fact sheets that you were given:   Surgical Site Infection Prevention / COVID         _X___ Take these medicines the morning of surgery with A SIP OF WATER:    1. AMLODIPINE  2. CARVEDILOL  3. HYDRALAZINE  4.  5.  6.  ____ Fleet Enema (as directed)   _X___ Use CHG Soap as directed  ____ Use inhalers on the  day of surgery  ____ Stop metformin 2 days prior to surgery    __X__ Take 1/2 of usual insulin dose the night before surgery. No insulin the morning          of surgery.   ____ Stop Coumadin/Plavix/aspirin on   ____ Stop Anti-inflammatories on   ____ Stop supplements until after surgery.    ____ Bring C-Pap to the hospital.

## 2018-11-16 ENCOUNTER — Encounter: Payer: Self-pay | Admitting: Anesthesiology

## 2018-11-16 ENCOUNTER — Ambulatory Visit
Admission: RE | Admit: 2018-11-16 | Discharge: 2018-11-16 | Disposition: A | Discharge status: Home / Self Care | Payer: Medicare Other | Attending: Vascular Surgery | Admitting: Vascular Surgery

## 2018-11-16 ENCOUNTER — Encounter
Admission: RE | Disposition: A | Discharge status: Home / Self Care | Payer: Self-pay | Source: Home / Self Care | Attending: Vascular Surgery

## 2018-11-16 DIAGNOSIS — N186 End stage renal disease: Secondary | ICD-10-CM | POA: Diagnosis not present

## 2018-11-16 DIAGNOSIS — Z1159 Encounter for screening for other viral diseases: Secondary | ICD-10-CM | POA: Diagnosis not present

## 2018-11-16 DIAGNOSIS — Z5309 Procedure and treatment not carried out because of other contraindication: Secondary | ICD-10-CM | POA: Diagnosis not present

## 2018-11-16 LAB — POTASSIUM
Potassium: 5.7 mmol/L — ABNORMAL HIGH (ref 3.5–5.1)
Potassium: 6.2 mmol/L — ABNORMAL HIGH (ref 3.5–5.1)

## 2018-11-16 LAB — GLUCOSE, CAPILLARY
Glucose-Capillary: 60 mg/dL — ABNORMAL LOW (ref 70–99)
Glucose-Capillary: 72 mg/dL (ref 70–99)

## 2018-11-16 LAB — POCT I-STAT 4, (NA,K, GLUC, HGB,HCT)
Glucose, Bld: 71 mg/dL (ref 70–99)
HCT: 43 % (ref 39.0–52.0)
Hemoglobin: 14.6 g/dL (ref 13.0–17.0)
Potassium: 5.7 mmol/L — ABNORMAL HIGH (ref 3.5–5.1)
Sodium: 137 mmol/L (ref 135–145)

## 2018-11-16 SURGERY — ARTERIOVENOUS (AV) FISTULA CREATION
Anesthesia: General | Laterality: Left

## 2018-11-16 MED ORDER — DEXTROSE 50 % IV SOLN
INTRAVENOUS | Status: AC
  Administered 2018-11-16: 12:00:00 25 mL via INTRAVENOUS
  Filled 2018-11-16: qty 50

## 2018-11-16 MED ORDER — FAMOTIDINE 20 MG PO TABS
ORAL_TABLET | ORAL | Status: AC
  Administered 2018-11-16: 20 mg via ORAL
  Filled 2018-11-16: qty 1

## 2018-11-16 MED ORDER — BUPIVACAINE-EPINEPHRINE (PF) 0.5% -1:200000 IJ SOLN
INTRAMUSCULAR | Status: AC
  Filled 2018-11-16: qty 30

## 2018-11-16 MED ORDER — CHLORHEXIDINE GLUCONATE CLOTH 2 % EX PADS: 6.0000 | MEDICATED_PAD | Freq: Once | CUTANEOUS | Status: DC

## 2018-11-16 MED ORDER — HEPARIN SODIUM (PORCINE) 5000 UNIT/ML IJ SOLN
INTRAMUSCULAR | Status: AC
  Filled 2018-11-16: qty 1

## 2018-11-16 MED ORDER — PAPAVERINE HCL 30 MG/ML IJ SOLN
INTRAMUSCULAR | Status: AC
  Filled 2018-11-16: qty 2

## 2018-11-16 MED ORDER — MIDAZOLAM HCL 2 MG/2ML IJ SOLN
INTRAMUSCULAR | Status: AC
  Filled 2018-11-16: qty 2

## 2018-11-16 MED ORDER — FENTANYL CITRATE (PF) 100 MCG/2ML IJ SOLN
INTRAMUSCULAR | Status: AC
  Filled 2018-11-16: qty 2

## 2018-11-16 MED ORDER — PROPOFOL 10 MG/ML IV BOLUS
INTRAVENOUS | Status: AC
  Filled 2018-11-16: qty 20

## 2018-11-16 MED ORDER — SODIUM CHLORIDE 0.9 % IV SOLN: INTRAVENOUS | Status: DC

## 2018-11-16 MED ORDER — DEXTROSE 50 % IV SOLN
25.0000 mL | Freq: Once | INTRAVENOUS | Status: AC
  Administered 2018-11-16: 25 mL via INTRAVENOUS

## 2018-11-16 MED ORDER — DEXTROSE-NACL 5-0.9 % IV SOLN
INTRAVENOUS | Status: DC
  Administered 2018-11-16: 14:00:00 via INTRAVENOUS

## 2018-11-16 MED ORDER — DEXTROSE 50 % IV SOLN
25.0000 mL | Freq: Once | INTRAVENOUS | Status: AC
  Administered 2018-11-16: 12:00:00 25 mL via INTRAVENOUS

## 2018-11-16 MED ORDER — CEFAZOLIN SODIUM-DEXTROSE 1-4 GM/50ML-% IV SOLN
INTRAVENOUS | Status: AC
  Filled 2018-11-16: qty 50

## 2018-11-16 MED ORDER — FAMOTIDINE 20 MG PO TABS
20.0000 mg | ORAL_TABLET | Freq: Once | ORAL | Status: AC
  Administered 2018-11-16: 20 mg via ORAL

## 2018-11-16 SURGICAL SUPPLY — 50 items
BAG DECANTER FOR FLEXI CONT (MISCELLANEOUS) ×3 IMPLANT
BLADE SURG SZ11 CARB STEEL (BLADE) ×3 IMPLANT
BOOT SUTURE AID YELLOW STND (SUTURE) ×3 IMPLANT
BRUSH SCRUB EZ  4% CHG (MISCELLANEOUS) ×2
BRUSH SCRUB EZ 4% CHG (MISCELLANEOUS) ×1 IMPLANT
CANISTER SUCT 1200ML W/VALVE (MISCELLANEOUS) ×3 IMPLANT
CHLORAPREP W/TINT 26 (MISCELLANEOUS) ×3 IMPLANT
CLIP SPRNG 6MM S-JAW DBL (CLIP) ×3
COVER WAND RF STERILE (DRAPES) ×3 IMPLANT
DERMABOND ADVANCED (GAUZE/BANDAGES/DRESSINGS) ×2
DERMABOND ADVANCED .7 DNX12 (GAUZE/BANDAGES/DRESSINGS) ×1 IMPLANT
ELECT CAUTERY BLADE 6.4 (BLADE) ×3 IMPLANT
ELECT REM PT RETURN 9FT ADLT (ELECTROSURGICAL) ×3
ELECTRODE REM PT RTRN 9FT ADLT (ELECTROSURGICAL) ×1 IMPLANT
GEL ULTRASOUND 20GR AQUASONIC (MISCELLANEOUS) IMPLANT
GLOVE BIO SURGEON STRL SZ7 (GLOVE) ×6 IMPLANT
GLOVE INDICATOR 7.5 STRL GRN (GLOVE) ×3 IMPLANT
GOWN STRL REUS W/ TWL LRG LVL3 (GOWN DISPOSABLE) ×2 IMPLANT
GOWN STRL REUS W/ TWL XL LVL3 (GOWN DISPOSABLE) ×1 IMPLANT
GOWN STRL REUS W/TWL LRG LVL3 (GOWN DISPOSABLE) ×4
GOWN STRL REUS W/TWL XL LVL3 (GOWN DISPOSABLE) ×2
HEMOSTAT SURGICEL 2X3 (HEMOSTASIS) ×3 IMPLANT
IV NS 500ML (IV SOLUTION) ×2
IV NS 500ML BAXH (IV SOLUTION) ×1 IMPLANT
KIT TURNOVER KIT A (KITS) ×3 IMPLANT
LABEL OR SOLS (LABEL) ×3 IMPLANT
LOOP RED MAXI  1X406MM (MISCELLANEOUS) ×2
LOOP VESSEL MAXI 1X406 RED (MISCELLANEOUS) ×1 IMPLANT
LOOP VESSEL MINI 0.8X406 BLUE (MISCELLANEOUS) ×1 IMPLANT
LOOPS BLUE MINI 0.8X406MM (MISCELLANEOUS) ×2
NEEDLE FILTER BLUNT 18X 1/2SAF (NEEDLE) ×2
NEEDLE FILTER BLUNT 18X1 1/2 (NEEDLE) ×1 IMPLANT
NS IRRIG 500ML POUR BTL (IV SOLUTION) ×3 IMPLANT
PACK EXTREMITY ARMC (MISCELLANEOUS) ×3 IMPLANT
PAD PREP 24X41 OB/GYN DISP (PERSONAL CARE ITEMS) ×3 IMPLANT
SOLUTION CELL SAVER (CLIP) ×1 IMPLANT
STOCKINETTE STRL 4IN 9604848 (GAUZE/BANDAGES/DRESSINGS) ×3 IMPLANT
SUT MNCRL AB 4-0 PS2 18 (SUTURE) ×3 IMPLANT
SUT PROLENE 6 0 BV (SUTURE) ×12 IMPLANT
SUT SILK 2 0 (SUTURE) ×2
SUT SILK 2-0 18XBRD TIE 12 (SUTURE) ×1 IMPLANT
SUT SILK 3 0 (SUTURE) ×2
SUT SILK 3-0 18XBRD TIE 12 (SUTURE) ×1 IMPLANT
SUT SILK 4 0 (SUTURE) ×2
SUT SILK 4-0 18XBRD TIE 12 (SUTURE) ×1 IMPLANT
SUT VIC AB 3-0 SH 27 (SUTURE) ×2
SUT VIC AB 3-0 SH 27X BRD (SUTURE) ×1 IMPLANT
SYR 20CC LL (SYRINGE) ×3 IMPLANT
SYR 3ML LL SCALE MARK (SYRINGE) ×3 IMPLANT
SYR TB 1ML 27GX1/2 LL (SYRINGE) IMPLANT

## 2018-11-16 NOTE — Anesthesia Preprocedure Evaluation (Signed)
Anesthesia Evaluation  Patient identified by MRN, date of birth, ID band Patient awake    Reviewed: Allergy & Precautions, NPO status , Patient's Chart, lab work & pertinent test results, reviewed documented beta blocker date and time   Airway Mallampati: III  TM Distance: >3 FB     Dental  (+) Chipped, Partial Upper, Partial Lower   Pulmonary former smoker,           Cardiovascular hypertension,      Neuro/Psych    GI/Hepatic   Endo/Other  diabetes, Type 2Morbid obesity  Renal/GU ESRFRenal disease     Musculoskeletal   Abdominal   Peds  Hematology   Anesthesia Other Findings EKG and ECHO ok.  Reproductive/Obstetrics                             Anesthesia Physical Anesthesia Plan  ASA: III  Anesthesia Plan: General   Post-op Pain Management:    Induction: Intravenous  PONV Risk Score and Plan:   Airway Management Planned: LMA  Additional Equipment:   Intra-op Plan:   Post-operative Plan:   Informed Consent: I have reviewed the patients History and Physical, chart, labs and discussed the procedure including the risks, benefits and alternatives for the proposed anesthesia with the patient or authorized representative who has indicated his/her understanding and acceptance.       Plan Discussed with: CRNA  Anesthesia Plan Comments:         Anesthesia Quick Evaluation

## 2018-11-16 NOTE — OR Nursing (Signed)
I stat potassium was 5.7 two times. Lab draw was 6.2. Dr. Marcello Moores notified and surgery has been cancelled by him. Dr. Lucky Cowboy notified. Patient is eating graham crackers with peanut butter and drank an orange juice.

## 2018-11-16 NOTE — Progress Notes (Signed)
K came back over 6 and not able to do procedure today.  Will reschedule

## 2018-11-16 NOTE — H&P (Signed)
[facility] History & Physical Update  The patient was interviewed and re-examined.  The patient's previous History and Physical has been reviewed and is unchanged.  There is no change in the plan of care. We plan to proceed with the scheduled procedure.  Leotis Pain, MD  11/16/2018, 2:40 PM

## 2018-11-17 ENCOUNTER — Encounter (INDEPENDENT_AMBULATORY_CARE_PROVIDER_SITE_OTHER): Payer: Self-pay

## 2018-11-17 NOTE — Telephone Encounter (Signed)
Patient's surgery had to be canceled and rescheduled due to high potassium. Patient is now scheduled for 11/22/2018 with Dew and his Covid test will be on 11/21/2018 after dialysis. This information has been given to the dialysis center for the patient.

## 2018-11-20 ENCOUNTER — Other Ambulatory Visit (INDEPENDENT_AMBULATORY_CARE_PROVIDER_SITE_OTHER): Payer: Self-pay | Admitting: Nurse Practitioner

## 2018-11-20 ENCOUNTER — Telehealth (INDEPENDENT_AMBULATORY_CARE_PROVIDER_SITE_OTHER): Payer: Self-pay

## 2018-11-20 DIAGNOSIS — D631 Anemia in chronic kidney disease: Secondary | ICD-10-CM | POA: Diagnosis not present

## 2018-11-20 DIAGNOSIS — D509 Iron deficiency anemia, unspecified: Secondary | ICD-10-CM | POA: Diagnosis not present

## 2018-11-20 DIAGNOSIS — N186 End stage renal disease: Secondary | ICD-10-CM | POA: Diagnosis not present

## 2018-11-20 DIAGNOSIS — N2581 Secondary hyperparathyroidism of renal origin: Secondary | ICD-10-CM | POA: Diagnosis not present

## 2018-11-20 DIAGNOSIS — Z23 Encounter for immunization: Secondary | ICD-10-CM | POA: Diagnosis not present

## 2018-11-20 DIAGNOSIS — Z992 Dependence on renal dialysis: Secondary | ICD-10-CM | POA: Diagnosis not present

## 2018-11-20 NOTE — Telephone Encounter (Signed)
Spoke with the patient and gave him his rescheduled surgery day of 11/22/2018 with Dr. Lucky Cowboy. Patient will have Covid testing on 6-02/2019 before 3:00pm. Patient understood all information.

## 2018-11-21 ENCOUNTER — Other Ambulatory Visit
Admission: RE | Admit: 2018-11-21 | Discharge: 2018-11-21 | Disposition: A | Discharge status: Home / Self Care | Payer: Medicare Other | Source: Ambulatory Visit | Attending: Vascular Surgery | Admitting: Vascular Surgery

## 2018-11-21 ENCOUNTER — Other Ambulatory Visit: Payer: Self-pay

## 2018-11-21 ENCOUNTER — Encounter: Payer: Self-pay | Admitting: Anesthesiology

## 2018-11-21 ENCOUNTER — Telehealth (INDEPENDENT_AMBULATORY_CARE_PROVIDER_SITE_OTHER): Payer: Self-pay

## 2018-11-21 DIAGNOSIS — Z1159 Encounter for screening for other viral diseases: Secondary | ICD-10-CM | POA: Insufficient documentation

## 2018-11-21 DIAGNOSIS — D631 Anemia in chronic kidney disease: Secondary | ICD-10-CM | POA: Diagnosis not present

## 2018-11-21 DIAGNOSIS — N186 End stage renal disease: Secondary | ICD-10-CM | POA: Diagnosis not present

## 2018-11-21 DIAGNOSIS — Z23 Encounter for immunization: Secondary | ICD-10-CM | POA: Diagnosis not present

## 2018-11-21 DIAGNOSIS — N2581 Secondary hyperparathyroidism of renal origin: Secondary | ICD-10-CM | POA: Diagnosis not present

## 2018-11-21 DIAGNOSIS — Z992 Dependence on renal dialysis: Secondary | ICD-10-CM | POA: Diagnosis not present

## 2018-11-21 DIAGNOSIS — Z01812 Encounter for preprocedural laboratory examination: Secondary | ICD-10-CM | POA: Insufficient documentation

## 2018-11-21 DIAGNOSIS — U071 COVID-19: Secondary | ICD-10-CM | POA: Diagnosis not present

## 2018-11-21 DIAGNOSIS — D509 Iron deficiency anemia, unspecified: Secondary | ICD-10-CM | POA: Diagnosis not present

## 2018-11-21 LAB — SARS CORONAVIRUS 2 BY RT PCR (HOSPITAL ORDER, PERFORMED IN ~~LOC~~ HOSPITAL LAB): SARS Coronavirus 2: POSITIVE — AB

## 2018-11-21 NOTE — Telephone Encounter (Signed)
I received a call from pre-admission stating the patient has tested positive for Covid-19. The patient's surgery has been canceled and I informed the patient the surgery was canceled due to his having a positive status and that we will reschedule him after he has tested negative . Patient was unaware of his being positive. I advised the patient to contact his PCP.

## 2018-11-21 NOTE — Telephone Encounter (Signed)
Patient did state he lives with a cancer patient at which time I told him it is very important that he does not go around that person and to make sure that person notify their care provider.

## 2018-11-22 ENCOUNTER — Ambulatory Visit: Admission: RE | Admit: 2018-11-22 | Payer: Medicare Other | Source: Home / Self Care | Admitting: Vascular Surgery

## 2018-11-22 ENCOUNTER — Encounter: Admission: RE | Payer: Self-pay | Source: Home / Self Care

## 2018-11-22 SURGERY — ARTERIOVENOUS (AV) FISTULA CREATION
Anesthesia: General | Laterality: Left

## 2018-11-23 DIAGNOSIS — N2581 Secondary hyperparathyroidism of renal origin: Secondary | ICD-10-CM | POA: Diagnosis not present

## 2018-11-23 DIAGNOSIS — Z992 Dependence on renal dialysis: Secondary | ICD-10-CM | POA: Diagnosis not present

## 2018-11-23 DIAGNOSIS — D509 Iron deficiency anemia, unspecified: Secondary | ICD-10-CM | POA: Diagnosis not present

## 2018-11-23 DIAGNOSIS — N186 End stage renal disease: Secondary | ICD-10-CM | POA: Diagnosis not present

## 2018-11-23 DIAGNOSIS — D631 Anemia in chronic kidney disease: Secondary | ICD-10-CM | POA: Diagnosis not present

## 2018-11-23 DIAGNOSIS — Z23 Encounter for immunization: Secondary | ICD-10-CM | POA: Diagnosis not present

## 2018-11-25 DIAGNOSIS — D509 Iron deficiency anemia, unspecified: Secondary | ICD-10-CM | POA: Diagnosis not present

## 2018-11-25 DIAGNOSIS — N186 End stage renal disease: Secondary | ICD-10-CM | POA: Diagnosis not present

## 2018-11-25 DIAGNOSIS — N2581 Secondary hyperparathyroidism of renal origin: Secondary | ICD-10-CM | POA: Diagnosis not present

## 2018-11-25 DIAGNOSIS — Z23 Encounter for immunization: Secondary | ICD-10-CM | POA: Diagnosis not present

## 2018-11-25 DIAGNOSIS — D631 Anemia in chronic kidney disease: Secondary | ICD-10-CM | POA: Diagnosis not present

## 2018-11-25 DIAGNOSIS — Z992 Dependence on renal dialysis: Secondary | ICD-10-CM | POA: Diagnosis not present

## 2018-11-28 DIAGNOSIS — Z992 Dependence on renal dialysis: Secondary | ICD-10-CM | POA: Diagnosis not present

## 2018-11-28 DIAGNOSIS — Z23 Encounter for immunization: Secondary | ICD-10-CM | POA: Diagnosis not present

## 2018-11-28 DIAGNOSIS — D509 Iron deficiency anemia, unspecified: Secondary | ICD-10-CM | POA: Diagnosis not present

## 2018-11-28 DIAGNOSIS — D631 Anemia in chronic kidney disease: Secondary | ICD-10-CM | POA: Diagnosis not present

## 2018-11-28 DIAGNOSIS — N2581 Secondary hyperparathyroidism of renal origin: Secondary | ICD-10-CM | POA: Diagnosis not present

## 2018-11-28 DIAGNOSIS — N186 End stage renal disease: Secondary | ICD-10-CM | POA: Diagnosis not present

## 2018-11-28 DIAGNOSIS — U071 COVID-19: Secondary | ICD-10-CM | POA: Diagnosis not present

## 2018-11-30 DIAGNOSIS — Z23 Encounter for immunization: Secondary | ICD-10-CM | POA: Diagnosis not present

## 2018-11-30 DIAGNOSIS — N186 End stage renal disease: Secondary | ICD-10-CM | POA: Diagnosis not present

## 2018-11-30 DIAGNOSIS — N2581 Secondary hyperparathyroidism of renal origin: Secondary | ICD-10-CM | POA: Diagnosis not present

## 2018-11-30 DIAGNOSIS — D631 Anemia in chronic kidney disease: Secondary | ICD-10-CM | POA: Diagnosis not present

## 2018-11-30 DIAGNOSIS — D509 Iron deficiency anemia, unspecified: Secondary | ICD-10-CM | POA: Diagnosis not present

## 2018-11-30 DIAGNOSIS — Z992 Dependence on renal dialysis: Secondary | ICD-10-CM | POA: Diagnosis not present

## 2018-12-02 DIAGNOSIS — D631 Anemia in chronic kidney disease: Secondary | ICD-10-CM | POA: Diagnosis present

## 2018-12-02 DIAGNOSIS — R6883 Chills (without fever): Secondary | ICD-10-CM | POA: Diagnosis not present

## 2018-12-02 DIAGNOSIS — U071 COVID-19: Secondary | ICD-10-CM | POA: Diagnosis present

## 2018-12-02 DIAGNOSIS — E785 Hyperlipidemia, unspecified: Secondary | ICD-10-CM | POA: Diagnosis not present

## 2018-12-02 DIAGNOSIS — Z6835 Body mass index (BMI) 35.0-35.9, adult: Secondary | ICD-10-CM | POA: Diagnosis not present

## 2018-12-02 DIAGNOSIS — R0600 Dyspnea, unspecified: Secondary | ICD-10-CM | POA: Diagnosis not present

## 2018-12-02 DIAGNOSIS — R55 Syncope and collapse: Secondary | ICD-10-CM | POA: Diagnosis present

## 2018-12-02 DIAGNOSIS — R531 Weakness: Secondary | ICD-10-CM | POA: Diagnosis not present

## 2018-12-02 DIAGNOSIS — E1122 Type 2 diabetes mellitus with diabetic chronic kidney disease: Secondary | ICD-10-CM | POA: Diagnosis present

## 2018-12-02 DIAGNOSIS — Z7984 Long term (current) use of oral hypoglycemic drugs: Secondary | ICD-10-CM | POA: Diagnosis not present

## 2018-12-02 DIAGNOSIS — R509 Fever, unspecified: Secondary | ICD-10-CM | POA: Diagnosis present

## 2018-12-02 DIAGNOSIS — Z9981 Dependence on supplemental oxygen: Secondary | ICD-10-CM | POA: Diagnosis not present

## 2018-12-02 DIAGNOSIS — R197 Diarrhea, unspecified: Secondary | ICD-10-CM | POA: Diagnosis present

## 2018-12-02 DIAGNOSIS — R791 Abnormal coagulation profile: Secondary | ICD-10-CM | POA: Diagnosis not present

## 2018-12-02 DIAGNOSIS — R0989 Other specified symptoms and signs involving the circulatory and respiratory systems: Secondary | ICD-10-CM | POA: Diagnosis not present

## 2018-12-02 DIAGNOSIS — I120 Hypertensive chronic kidney disease with stage 5 chronic kidney disease or end stage renal disease: Secondary | ICD-10-CM | POA: Diagnosis present

## 2018-12-02 DIAGNOSIS — B349 Viral infection, unspecified: Secondary | ICD-10-CM | POA: Diagnosis not present

## 2018-12-02 DIAGNOSIS — N186 End stage renal disease: Secondary | ICD-10-CM | POA: Diagnosis present

## 2018-12-02 DIAGNOSIS — R0902 Hypoxemia: Secondary | ICD-10-CM | POA: Diagnosis present

## 2018-12-02 DIAGNOSIS — R7989 Other specified abnormal findings of blood chemistry: Secondary | ICD-10-CM | POA: Diagnosis not present

## 2018-12-02 DIAGNOSIS — J988 Other specified respiratory disorders: Secondary | ICD-10-CM | POA: Diagnosis not present

## 2018-12-02 DIAGNOSIS — R51 Headache: Secondary | ICD-10-CM | POA: Diagnosis not present

## 2018-12-02 DIAGNOSIS — Z87891 Personal history of nicotine dependence: Secondary | ICD-10-CM | POA: Diagnosis not present

## 2018-12-02 DIAGNOSIS — E119 Type 2 diabetes mellitus without complications: Secondary | ICD-10-CM | POA: Diagnosis not present

## 2018-12-02 DIAGNOSIS — R918 Other nonspecific abnormal finding of lung field: Secondary | ICD-10-CM | POA: Diagnosis not present

## 2018-12-02 DIAGNOSIS — Z79899 Other long term (current) drug therapy: Secondary | ICD-10-CM | POA: Diagnosis not present

## 2018-12-02 DIAGNOSIS — N2581 Secondary hyperparathyroidism of renal origin: Secondary | ICD-10-CM | POA: Diagnosis present

## 2018-12-02 DIAGNOSIS — Z992 Dependence on renal dialysis: Secondary | ICD-10-CM | POA: Diagnosis not present

## 2018-12-04 MED ORDER — ATORVASTATIN CALCIUM 80 MG PO TABS
80.00 | ORAL_TABLET | ORAL | Status: DC
Start: 2018-12-04 — End: 2018-12-04

## 2018-12-04 MED ORDER — GENERIC EXTERNAL MEDICATION
12.50 | Status: DC
Start: ? — End: 2018-12-04

## 2018-12-04 MED ORDER — ACETAMINOPHEN 325 MG PO TABS
650.00 | ORAL_TABLET | ORAL | Status: DC
Start: ? — End: 2018-12-04

## 2018-12-04 MED ORDER — EPOETIN ALFA-EPBX 3000 UNIT/ML IJ SOLN
5800.00 | INTRAMUSCULAR | Status: DC
Start: ? — End: 2018-12-04

## 2018-12-04 MED ORDER — NITROGLYCERIN 0.4 MG SL SUBL
0.40 | SUBLINGUAL_TABLET | SUBLINGUAL | Status: DC
Start: ? — End: 2018-12-04

## 2018-12-04 MED ORDER — CALCIUM ACETATE (PHOS BINDER) 667 MG PO CAPS
2668.00 | ORAL_CAPSULE | ORAL | Status: DC
Start: 2018-12-04 — End: 2018-12-04

## 2018-12-04 MED ORDER — BUMETANIDE 1 MG PO TABS
2.00 | ORAL_TABLET | ORAL | Status: DC
Start: 2018-12-05 — End: 2018-12-04

## 2018-12-04 MED ORDER — GENERIC EXTERNAL MEDICATION
1.50 | Status: DC
Start: ? — End: 2018-12-04

## 2018-12-04 MED ORDER — TRAZODONE HCL 50 MG PO TABS
50.00 | ORAL_TABLET | ORAL | Status: DC
Start: 2018-12-04 — End: 2018-12-04

## 2018-12-04 MED ORDER — CARVEDILOL 6.25 MG PO TABS
6.25 | ORAL_TABLET | ORAL | Status: DC
Start: 2018-12-05 — End: 2018-12-04

## 2018-12-04 MED ORDER — APIXABAN 2.5 MG PO TABS
2.50 | ORAL_TABLET | ORAL | Status: DC
Start: 2018-12-04 — End: 2018-12-04

## 2018-12-04 MED ORDER — AMLODIPINE BESYLATE 5 MG PO TABS
5.00 | ORAL_TABLET | ORAL | Status: DC
Start: 2018-12-04 — End: 2018-12-04

## 2018-12-04 MED ORDER — ALBUMIN HUMAN 25 % IV SOLN
25.00 | INTRAVENOUS | Status: DC
Start: ? — End: 2018-12-04

## 2018-12-04 MED ORDER — DIPHENHYDRAMINE HCL 25 MG PO CAPS
25.00 | ORAL_CAPSULE | ORAL | Status: DC
Start: ? — End: 2018-12-04

## 2018-12-05 DIAGNOSIS — Z992 Dependence on renal dialysis: Secondary | ICD-10-CM | POA: Diagnosis not present

## 2018-12-05 DIAGNOSIS — N186 End stage renal disease: Secondary | ICD-10-CM | POA: Diagnosis not present

## 2018-12-05 DIAGNOSIS — N2581 Secondary hyperparathyroidism of renal origin: Secondary | ICD-10-CM | POA: Diagnosis not present

## 2018-12-05 DIAGNOSIS — Z23 Encounter for immunization: Secondary | ICD-10-CM | POA: Diagnosis not present

## 2018-12-05 DIAGNOSIS — D631 Anemia in chronic kidney disease: Secondary | ICD-10-CM | POA: Diagnosis not present

## 2018-12-05 DIAGNOSIS — D509 Iron deficiency anemia, unspecified: Secondary | ICD-10-CM | POA: Diagnosis not present

## 2018-12-07 DIAGNOSIS — N186 End stage renal disease: Secondary | ICD-10-CM | POA: Diagnosis not present

## 2018-12-07 DIAGNOSIS — N2581 Secondary hyperparathyroidism of renal origin: Secondary | ICD-10-CM | POA: Diagnosis not present

## 2018-12-07 DIAGNOSIS — Z23 Encounter for immunization: Secondary | ICD-10-CM | POA: Diagnosis not present

## 2018-12-07 DIAGNOSIS — Z992 Dependence on renal dialysis: Secondary | ICD-10-CM | POA: Diagnosis not present

## 2018-12-07 DIAGNOSIS — D631 Anemia in chronic kidney disease: Secondary | ICD-10-CM | POA: Diagnosis not present

## 2018-12-07 DIAGNOSIS — D509 Iron deficiency anemia, unspecified: Secondary | ICD-10-CM | POA: Diagnosis not present

## 2018-12-09 DIAGNOSIS — N186 End stage renal disease: Secondary | ICD-10-CM | POA: Diagnosis not present

## 2018-12-09 DIAGNOSIS — D631 Anemia in chronic kidney disease: Secondary | ICD-10-CM | POA: Diagnosis not present

## 2018-12-09 DIAGNOSIS — Z992 Dependence on renal dialysis: Secondary | ICD-10-CM | POA: Diagnosis not present

## 2018-12-09 DIAGNOSIS — N2581 Secondary hyperparathyroidism of renal origin: Secondary | ICD-10-CM | POA: Diagnosis not present

## 2018-12-09 DIAGNOSIS — Z23 Encounter for immunization: Secondary | ICD-10-CM | POA: Diagnosis not present

## 2018-12-09 DIAGNOSIS — D509 Iron deficiency anemia, unspecified: Secondary | ICD-10-CM | POA: Diagnosis not present

## 2018-12-12 DIAGNOSIS — D509 Iron deficiency anemia, unspecified: Secondary | ICD-10-CM | POA: Diagnosis not present

## 2018-12-12 DIAGNOSIS — N2581 Secondary hyperparathyroidism of renal origin: Secondary | ICD-10-CM | POA: Diagnosis not present

## 2018-12-12 DIAGNOSIS — Z992 Dependence on renal dialysis: Secondary | ICD-10-CM | POA: Diagnosis not present

## 2018-12-12 DIAGNOSIS — N186 End stage renal disease: Secondary | ICD-10-CM | POA: Diagnosis not present

## 2018-12-12 DIAGNOSIS — U071 COVID-19: Secondary | ICD-10-CM | POA: Diagnosis not present

## 2018-12-12 DIAGNOSIS — Z23 Encounter for immunization: Secondary | ICD-10-CM | POA: Diagnosis not present

## 2018-12-12 DIAGNOSIS — D631 Anemia in chronic kidney disease: Secondary | ICD-10-CM | POA: Diagnosis not present

## 2018-12-14 DIAGNOSIS — Z992 Dependence on renal dialysis: Secondary | ICD-10-CM | POA: Diagnosis not present

## 2018-12-14 DIAGNOSIS — D509 Iron deficiency anemia, unspecified: Secondary | ICD-10-CM | POA: Diagnosis not present

## 2018-12-14 DIAGNOSIS — N186 End stage renal disease: Secondary | ICD-10-CM | POA: Diagnosis not present

## 2018-12-14 DIAGNOSIS — D631 Anemia in chronic kidney disease: Secondary | ICD-10-CM | POA: Diagnosis not present

## 2018-12-14 DIAGNOSIS — N2581 Secondary hyperparathyroidism of renal origin: Secondary | ICD-10-CM | POA: Diagnosis not present

## 2018-12-16 DIAGNOSIS — N186 End stage renal disease: Secondary | ICD-10-CM | POA: Diagnosis not present

## 2018-12-16 DIAGNOSIS — D509 Iron deficiency anemia, unspecified: Secondary | ICD-10-CM | POA: Diagnosis not present

## 2018-12-16 DIAGNOSIS — D631 Anemia in chronic kidney disease: Secondary | ICD-10-CM | POA: Diagnosis not present

## 2018-12-16 DIAGNOSIS — Z992 Dependence on renal dialysis: Secondary | ICD-10-CM | POA: Diagnosis not present

## 2018-12-16 DIAGNOSIS — N2581 Secondary hyperparathyroidism of renal origin: Secondary | ICD-10-CM | POA: Diagnosis not present

## 2018-12-19 DIAGNOSIS — D509 Iron deficiency anemia, unspecified: Secondary | ICD-10-CM | POA: Diagnosis not present

## 2018-12-19 DIAGNOSIS — D631 Anemia in chronic kidney disease: Secondary | ICD-10-CM | POA: Diagnosis not present

## 2018-12-19 DIAGNOSIS — N2581 Secondary hyperparathyroidism of renal origin: Secondary | ICD-10-CM | POA: Diagnosis not present

## 2018-12-19 DIAGNOSIS — N186 End stage renal disease: Secondary | ICD-10-CM | POA: Diagnosis not present

## 2018-12-19 DIAGNOSIS — Z992 Dependence on renal dialysis: Secondary | ICD-10-CM | POA: Diagnosis not present

## 2018-12-21 DIAGNOSIS — Z992 Dependence on renal dialysis: Secondary | ICD-10-CM | POA: Diagnosis not present

## 2018-12-21 DIAGNOSIS — D509 Iron deficiency anemia, unspecified: Secondary | ICD-10-CM | POA: Diagnosis not present

## 2018-12-21 DIAGNOSIS — N186 End stage renal disease: Secondary | ICD-10-CM | POA: Diagnosis not present

## 2018-12-21 DIAGNOSIS — N2581 Secondary hyperparathyroidism of renal origin: Secondary | ICD-10-CM | POA: Diagnosis not present

## 2018-12-21 DIAGNOSIS — D631 Anemia in chronic kidney disease: Secondary | ICD-10-CM | POA: Diagnosis not present

## 2018-12-23 DIAGNOSIS — D631 Anemia in chronic kidney disease: Secondary | ICD-10-CM | POA: Diagnosis not present

## 2018-12-23 DIAGNOSIS — N2581 Secondary hyperparathyroidism of renal origin: Secondary | ICD-10-CM | POA: Diagnosis not present

## 2018-12-23 DIAGNOSIS — D509 Iron deficiency anemia, unspecified: Secondary | ICD-10-CM | POA: Diagnosis not present

## 2018-12-23 DIAGNOSIS — N186 End stage renal disease: Secondary | ICD-10-CM | POA: Diagnosis not present

## 2018-12-23 DIAGNOSIS — Z992 Dependence on renal dialysis: Secondary | ICD-10-CM | POA: Diagnosis not present

## 2018-12-26 DIAGNOSIS — N186 End stage renal disease: Secondary | ICD-10-CM | POA: Diagnosis not present

## 2018-12-26 DIAGNOSIS — N2581 Secondary hyperparathyroidism of renal origin: Secondary | ICD-10-CM | POA: Diagnosis not present

## 2018-12-26 DIAGNOSIS — Z992 Dependence on renal dialysis: Secondary | ICD-10-CM | POA: Diagnosis not present

## 2018-12-26 DIAGNOSIS — D509 Iron deficiency anemia, unspecified: Secondary | ICD-10-CM | POA: Diagnosis not present

## 2018-12-26 DIAGNOSIS — D631 Anemia in chronic kidney disease: Secondary | ICD-10-CM | POA: Diagnosis not present

## 2018-12-30 DIAGNOSIS — Z992 Dependence on renal dialysis: Secondary | ICD-10-CM | POA: Diagnosis not present

## 2018-12-30 DIAGNOSIS — D509 Iron deficiency anemia, unspecified: Secondary | ICD-10-CM | POA: Diagnosis not present

## 2018-12-30 DIAGNOSIS — N186 End stage renal disease: Secondary | ICD-10-CM | POA: Diagnosis not present

## 2018-12-30 DIAGNOSIS — N2581 Secondary hyperparathyroidism of renal origin: Secondary | ICD-10-CM | POA: Diagnosis not present

## 2018-12-30 DIAGNOSIS — D631 Anemia in chronic kidney disease: Secondary | ICD-10-CM | POA: Diagnosis not present

## 2019-01-02 DIAGNOSIS — D631 Anemia in chronic kidney disease: Secondary | ICD-10-CM | POA: Diagnosis not present

## 2019-01-02 DIAGNOSIS — N2581 Secondary hyperparathyroidism of renal origin: Secondary | ICD-10-CM | POA: Diagnosis not present

## 2019-01-02 DIAGNOSIS — D509 Iron deficiency anemia, unspecified: Secondary | ICD-10-CM | POA: Diagnosis not present

## 2019-01-02 DIAGNOSIS — Z992 Dependence on renal dialysis: Secondary | ICD-10-CM | POA: Diagnosis not present

## 2019-01-02 DIAGNOSIS — N186 End stage renal disease: Secondary | ICD-10-CM | POA: Diagnosis not present

## 2019-01-04 DIAGNOSIS — D631 Anemia in chronic kidney disease: Secondary | ICD-10-CM | POA: Diagnosis not present

## 2019-01-04 DIAGNOSIS — N2581 Secondary hyperparathyroidism of renal origin: Secondary | ICD-10-CM | POA: Diagnosis not present

## 2019-01-04 DIAGNOSIS — U071 COVID-19: Secondary | ICD-10-CM | POA: Diagnosis not present

## 2019-01-04 DIAGNOSIS — Z992 Dependence on renal dialysis: Secondary | ICD-10-CM | POA: Diagnosis not present

## 2019-01-04 DIAGNOSIS — D509 Iron deficiency anemia, unspecified: Secondary | ICD-10-CM | POA: Diagnosis not present

## 2019-01-04 DIAGNOSIS — N186 End stage renal disease: Secondary | ICD-10-CM | POA: Diagnosis not present

## 2019-01-06 DIAGNOSIS — N2581 Secondary hyperparathyroidism of renal origin: Secondary | ICD-10-CM | POA: Diagnosis not present

## 2019-01-06 DIAGNOSIS — D631 Anemia in chronic kidney disease: Secondary | ICD-10-CM | POA: Diagnosis not present

## 2019-01-06 DIAGNOSIS — Z992 Dependence on renal dialysis: Secondary | ICD-10-CM | POA: Diagnosis not present

## 2019-01-06 DIAGNOSIS — N186 End stage renal disease: Secondary | ICD-10-CM | POA: Diagnosis not present

## 2019-01-06 DIAGNOSIS — D509 Iron deficiency anemia, unspecified: Secondary | ICD-10-CM | POA: Diagnosis not present

## 2019-01-10 DIAGNOSIS — D509 Iron deficiency anemia, unspecified: Secondary | ICD-10-CM | POA: Diagnosis not present

## 2019-01-10 DIAGNOSIS — D631 Anemia in chronic kidney disease: Secondary | ICD-10-CM | POA: Diagnosis not present

## 2019-01-10 DIAGNOSIS — N2581 Secondary hyperparathyroidism of renal origin: Secondary | ICD-10-CM | POA: Diagnosis not present

## 2019-01-10 DIAGNOSIS — Z992 Dependence on renal dialysis: Secondary | ICD-10-CM | POA: Diagnosis not present

## 2019-01-10 DIAGNOSIS — N186 End stage renal disease: Secondary | ICD-10-CM | POA: Diagnosis not present

## 2019-01-11 DIAGNOSIS — D631 Anemia in chronic kidney disease: Secondary | ICD-10-CM | POA: Diagnosis not present

## 2019-01-11 DIAGNOSIS — Z992 Dependence on renal dialysis: Secondary | ICD-10-CM | POA: Diagnosis not present

## 2019-01-11 DIAGNOSIS — N2581 Secondary hyperparathyroidism of renal origin: Secondary | ICD-10-CM | POA: Diagnosis not present

## 2019-01-11 DIAGNOSIS — N186 End stage renal disease: Secondary | ICD-10-CM | POA: Diagnosis not present

## 2019-01-11 DIAGNOSIS — D509 Iron deficiency anemia, unspecified: Secondary | ICD-10-CM | POA: Diagnosis not present

## 2019-01-12 DIAGNOSIS — N186 End stage renal disease: Secondary | ICD-10-CM | POA: Diagnosis not present

## 2019-01-12 DIAGNOSIS — Z992 Dependence on renal dialysis: Secondary | ICD-10-CM | POA: Diagnosis not present

## 2019-01-13 DIAGNOSIS — D631 Anemia in chronic kidney disease: Secondary | ICD-10-CM | POA: Diagnosis not present

## 2019-01-13 DIAGNOSIS — N186 End stage renal disease: Secondary | ICD-10-CM | POA: Diagnosis not present

## 2019-01-13 DIAGNOSIS — D509 Iron deficiency anemia, unspecified: Secondary | ICD-10-CM | POA: Diagnosis not present

## 2019-01-13 DIAGNOSIS — Z992 Dependence on renal dialysis: Secondary | ICD-10-CM | POA: Diagnosis not present

## 2019-01-16 ENCOUNTER — Telehealth (INDEPENDENT_AMBULATORY_CARE_PROVIDER_SITE_OTHER): Payer: Self-pay

## 2019-01-16 DIAGNOSIS — D509 Iron deficiency anemia, unspecified: Secondary | ICD-10-CM | POA: Diagnosis not present

## 2019-01-16 DIAGNOSIS — Z992 Dependence on renal dialysis: Secondary | ICD-10-CM | POA: Diagnosis not present

## 2019-01-16 DIAGNOSIS — N186 End stage renal disease: Secondary | ICD-10-CM | POA: Diagnosis not present

## 2019-01-16 DIAGNOSIS — D631 Anemia in chronic kidney disease: Secondary | ICD-10-CM | POA: Diagnosis not present

## 2019-01-16 NOTE — Telephone Encounter (Signed)
I spoke with Anderson Malta at [facility] regarding this patient and getting him scheduled for surgery again. Patient was canceled on 11/22/2018 due to be positive for Covid, he has been cleared to have surgery. Patient is scheduled with Dr.Dew for 01/31/2019 and will do his Covid test and pre-op on 01/26/2019 at 10:00 am at the [facility].  The pre-surgical instructions will be faxed to [facility] for the patient.

## 2019-01-17 DIAGNOSIS — Z992 Dependence on renal dialysis: Secondary | ICD-10-CM | POA: Diagnosis not present

## 2019-01-17 DIAGNOSIS — E8770 Fluid overload, unspecified: Secondary | ICD-10-CM | POA: Diagnosis not present

## 2019-01-17 DIAGNOSIS — E8779 Other fluid overload: Secondary | ICD-10-CM | POA: Diagnosis not present

## 2019-01-17 DIAGNOSIS — N186 End stage renal disease: Secondary | ICD-10-CM | POA: Diagnosis not present

## 2019-01-18 DIAGNOSIS — D509 Iron deficiency anemia, unspecified: Secondary | ICD-10-CM | POA: Diagnosis not present

## 2019-01-18 DIAGNOSIS — Z992 Dependence on renal dialysis: Secondary | ICD-10-CM | POA: Diagnosis not present

## 2019-01-18 DIAGNOSIS — D631 Anemia in chronic kidney disease: Secondary | ICD-10-CM | POA: Diagnosis not present

## 2019-01-18 DIAGNOSIS — N186 End stage renal disease: Secondary | ICD-10-CM | POA: Diagnosis not present

## 2019-01-20 DIAGNOSIS — D631 Anemia in chronic kidney disease: Secondary | ICD-10-CM | POA: Diagnosis not present

## 2019-01-20 DIAGNOSIS — N186 End stage renal disease: Secondary | ICD-10-CM | POA: Diagnosis not present

## 2019-01-20 DIAGNOSIS — Z992 Dependence on renal dialysis: Secondary | ICD-10-CM | POA: Diagnosis not present

## 2019-01-20 DIAGNOSIS — D509 Iron deficiency anemia, unspecified: Secondary | ICD-10-CM | POA: Diagnosis not present

## 2019-01-23 DIAGNOSIS — D631 Anemia in chronic kidney disease: Secondary | ICD-10-CM | POA: Diagnosis not present

## 2019-01-23 DIAGNOSIS — D509 Iron deficiency anemia, unspecified: Secondary | ICD-10-CM | POA: Diagnosis not present

## 2019-01-23 DIAGNOSIS — N186 End stage renal disease: Secondary | ICD-10-CM | POA: Diagnosis not present

## 2019-01-23 DIAGNOSIS — Z992 Dependence on renal dialysis: Secondary | ICD-10-CM | POA: Diagnosis not present

## 2019-01-24 DIAGNOSIS — N186 End stage renal disease: Secondary | ICD-10-CM | POA: Diagnosis not present

## 2019-01-24 DIAGNOSIS — E8770 Fluid overload, unspecified: Secondary | ICD-10-CM | POA: Diagnosis not present

## 2019-01-24 DIAGNOSIS — E8779 Other fluid overload: Secondary | ICD-10-CM | POA: Diagnosis not present

## 2019-01-24 DIAGNOSIS — Z992 Dependence on renal dialysis: Secondary | ICD-10-CM | POA: Diagnosis not present

## 2019-01-25 DIAGNOSIS — Z87891 Personal history of nicotine dependence: Secondary | ICD-10-CM | POA: Diagnosis not present

## 2019-01-25 DIAGNOSIS — E1122 Type 2 diabetes mellitus with diabetic chronic kidney disease: Secondary | ICD-10-CM | POA: Diagnosis not present

## 2019-01-25 DIAGNOSIS — N186 End stage renal disease: Secondary | ICD-10-CM | POA: Diagnosis not present

## 2019-01-25 DIAGNOSIS — R55 Syncope and collapse: Secondary | ICD-10-CM | POA: Diagnosis not present

## 2019-01-25 DIAGNOSIS — R42 Dizziness and giddiness: Secondary | ICD-10-CM | POA: Diagnosis not present

## 2019-01-25 DIAGNOSIS — D631 Anemia in chronic kidney disease: Secondary | ICD-10-CM | POA: Diagnosis not present

## 2019-01-25 DIAGNOSIS — E1142 Type 2 diabetes mellitus with diabetic polyneuropathy: Secondary | ICD-10-CM | POA: Diagnosis not present

## 2019-01-25 DIAGNOSIS — I120 Hypertensive chronic kidney disease with stage 5 chronic kidney disease or end stage renal disease: Secondary | ICD-10-CM | POA: Diagnosis not present

## 2019-01-25 DIAGNOSIS — Z7984 Long term (current) use of oral hypoglycemic drugs: Secondary | ICD-10-CM | POA: Diagnosis not present

## 2019-01-25 DIAGNOSIS — D509 Iron deficiency anemia, unspecified: Secondary | ICD-10-CM | POA: Diagnosis not present

## 2019-01-25 DIAGNOSIS — Z992 Dependence on renal dialysis: Secondary | ICD-10-CM | POA: Diagnosis not present

## 2019-01-26 ENCOUNTER — Other Ambulatory Visit (INDEPENDENT_AMBULATORY_CARE_PROVIDER_SITE_OTHER): Payer: Self-pay | Admitting: Nurse Practitioner

## 2019-01-26 ENCOUNTER — Encounter
Admission: RE | Admit: 2019-01-26 | Discharge: 2019-01-26 | Disposition: A | Discharge status: Home / Self Care | Payer: Medicare Other | Source: Ambulatory Visit | Attending: Vascular Surgery | Admitting: Vascular Surgery

## 2019-01-26 ENCOUNTER — Other Ambulatory Visit: Payer: Self-pay

## 2019-01-26 DIAGNOSIS — N186 End stage renal disease: Secondary | ICD-10-CM | POA: Diagnosis not present

## 2019-01-26 DIAGNOSIS — E1122 Type 2 diabetes mellitus with diabetic chronic kidney disease: Secondary | ICD-10-CM | POA: Diagnosis not present

## 2019-01-26 DIAGNOSIS — Z992 Dependence on renal dialysis: Secondary | ICD-10-CM | POA: Diagnosis not present

## 2019-01-26 DIAGNOSIS — I120 Hypertensive chronic kidney disease with stage 5 chronic kidney disease or end stage renal disease: Secondary | ICD-10-CM | POA: Insufficient documentation

## 2019-01-26 DIAGNOSIS — Z01812 Encounter for preprocedural laboratory examination: Secondary | ICD-10-CM | POA: Diagnosis not present

## 2019-01-26 DIAGNOSIS — Z87891 Personal history of nicotine dependence: Secondary | ICD-10-CM | POA: Insufficient documentation

## 2019-01-26 DIAGNOSIS — G4730 Sleep apnea, unspecified: Secondary | ICD-10-CM | POA: Insufficient documentation

## 2019-01-26 HISTORY — DX: Sleep apnea, unspecified: G47.30

## 2019-01-26 LAB — TYPE AND SCREEN
ABO/RH(D): O POS
Antibody Screen: NEGATIVE

## 2019-01-26 LAB — APTT: aPTT: 30 seconds (ref 24–36)

## 2019-01-26 LAB — PROTIME-INR
INR: 1 (ref 0.8–1.2)
Prothrombin Time: 12.9 seconds (ref 11.4–15.2)

## 2019-01-26 LAB — BASIC METABOLIC PANEL
Anion gap: 15 (ref 5–15)
BUN: 69 mg/dL — ABNORMAL HIGH (ref 6–20)
CO2: 25 mmol/L (ref 22–32)
Calcium: 8.7 mg/dL — ABNORMAL LOW (ref 8.9–10.3)
Chloride: 101 mmol/L (ref 98–111)
Creatinine, Ser: 9.81 mg/dL — ABNORMAL HIGH (ref 0.61–1.24)
GFR calc Af Amer: 7 mL/min — ABNORMAL LOW (ref >60)
GFR calc non Af Amer: 6 mL/min — ABNORMAL LOW (ref >60)
Glucose, Bld: 86 mg/dL (ref 70–99)
Potassium: 5 mmol/L (ref 3.5–5.1)
Sodium: 141 mmol/L (ref 135–145)

## 2019-01-26 LAB — CBC WITH DIFFERENTIAL/PLATELET
Abs Immature Granulocytes: 0.03 10*3/uL (ref 0.00–0.07)
Basophils Absolute: 0 10*3/uL (ref 0.0–0.1)
Basophils Relative: 1 %
Eosinophils Absolute: 0.1 10*3/uL (ref 0.0–0.5)
Eosinophils Relative: 2 %
HCT: 36.5 % — ABNORMAL LOW (ref 39.0–52.0)
Hemoglobin: 11.7 g/dL — ABNORMAL LOW (ref 13.0–17.0)
Immature Granulocytes: 1 %
Lymphocytes Relative: 35 %
Lymphs Abs: 2 10*3/uL (ref 0.7–4.0)
MCH: 32.6 pg (ref 26.0–34.0)
MCHC: 32.1 g/dL (ref 30.0–36.0)
MCV: 101.7 fL — ABNORMAL HIGH (ref 80.0–100.0)
Monocytes Absolute: 0.6 10*3/uL (ref 0.1–1.0)
Monocytes Relative: 10 %
Neutro Abs: 3 10*3/uL (ref 1.7–7.7)
Neutrophils Relative %: 51 %
Platelets: 149 10*3/uL — ABNORMAL LOW (ref 150–400)
RBC: 3.59 MIL/uL — ABNORMAL LOW (ref 4.22–5.81)
RDW: 15.7 % — ABNORMAL HIGH (ref 11.5–15.5)
WBC: 5.8 10*3/uL (ref 4.0–10.5)
nRBC: 0 % (ref 0.0–0.2)

## 2019-01-26 MED ORDER — ONDANSETRON HCL 4 MG/2ML IJ SOLN: 4.0000 mg | Freq: Once | INTRAMUSCULAR | Status: DC | PRN

## 2019-01-26 MED ORDER — FENTANYL CITRATE (PF) 100 MCG/2ML IJ SOLN
25.0000 ug | INTRAMUSCULAR | Status: DC | PRN
  Filled 2019-01-26: qty 0.5

## 2019-01-26 NOTE — Patient Instructions (Signed)
Your procedure is scheduled on: Wed. 8/19 Report to medical mall day surgery. To find out your arrival time please call [phone] between 1PM - 3PM on Tues 8/18  Remember: Instructions that are not followed completely may result in serious medical risk,  up to and including death, or upon the discretion of your surgeon and anesthesiologist your  surgery may need to be rescheduled.     _X__ 1. Do not eat food after midnight the night before your procedure.                 No gum chewing or hard candies. You may drink clear liquids up to 2 hours                 before you are scheduled to arrive for your surgery- DO not drink clear                 liquids within 2 hours of the start of your surgery.                 Clear Liquids include:  water,  Black Coffee or Tea (Do not add                 anything to coffee or tea).  __X__2.  On the morning of surgery brush your teeth with toothpaste and water, you                may rinse your mouth with mouthwash if you wish.  Do not swallow any toothpaste of mouthwash.     _X__ 3.  No Alcohol for 24 hours before or after surgery.   _X__ 4.  Do Not Smoke or use e-cigarettes For 24 Hours Prior to Your Surgery.                 Do not use any chewable tobacco products for at least 6 hours prior to                 surgery.  ____  5.  Bring all medications with you on the day of surgery if instructed.   __x__  6.  Notify your doctor if there is any change in your medical condition      (cold, fever, infections).     Do not wear jewelry, make-up, hairpins, clips or nail polish. Do not wear lotions, powders, or perfumes. You may wear deodorant. Do not shave 48 hours prior to surgery. Men may shave face and neck. Do not bring valuables to the hospital.    [facility] is not responsible for any belongings or valuables.  Contacts, dentures or bridgework may not be worn into surgery. Leave your suitcase in the car. After  surgery it may be brought to your room. For patients admitted to the hospital, discharge time is determined by your treatment team.   Patients discharged the day of surgery will not be allowed to drive home.   Please read over the following fact sheets that you were given:    __x__ Take these medicines the morning of surgery with A SIP OF WATER:    1. amLODipine (NORVASC) 10 MG tablet  2. atorvastatin (LIPITOR) 80 MG tablet  3. carvedilol (COREG) 12.5 MG tablet  4.hydrALAZINE (APRESOLINE) 25 MG tablet  5.  6.  ____ Fleet Enema (as directed)   __x__ Use Sage wipes as directed  ____ Use inhalers on the day of surgery  ____ Stop metformin 2 days prior to  surgery    ____ Take 1/2 of usual insulin dose the night before surgery. No insulin the morning          of surgery.   ____ Stop Coumadin/Plavix/aspirin on   ____ Stop Anti-inflammatories on    ____ Stop supplements until after surgery.    ____ Bring C-Pap to the hospital.

## 2019-01-26 NOTE — Pre-Procedure Instructions (Signed)
Has had repeated covid tests in the last 90 days.  +,-,+, inconclusive.  Has returned to regular dialysis and been released from the health dept. after quarantining for 14 days. No test needed before surgery

## 2019-01-27 DIAGNOSIS — N186 End stage renal disease: Secondary | ICD-10-CM | POA: Diagnosis not present

## 2019-01-27 DIAGNOSIS — Z992 Dependence on renal dialysis: Secondary | ICD-10-CM | POA: Diagnosis not present

## 2019-01-27 DIAGNOSIS — D631 Anemia in chronic kidney disease: Secondary | ICD-10-CM | POA: Diagnosis not present

## 2019-01-27 DIAGNOSIS — D509 Iron deficiency anemia, unspecified: Secondary | ICD-10-CM | POA: Diagnosis not present

## 2019-01-30 ENCOUNTER — Telehealth (INDEPENDENT_AMBULATORY_CARE_PROVIDER_SITE_OTHER): Payer: Self-pay

## 2019-01-30 DIAGNOSIS — N186 End stage renal disease: Secondary | ICD-10-CM | POA: Diagnosis not present

## 2019-01-30 DIAGNOSIS — Z992 Dependence on renal dialysis: Secondary | ICD-10-CM | POA: Diagnosis not present

## 2019-01-30 DIAGNOSIS — D631 Anemia in chronic kidney disease: Secondary | ICD-10-CM | POA: Diagnosis not present

## 2019-01-30 DIAGNOSIS — D509 Iron deficiency anemia, unspecified: Secondary | ICD-10-CM | POA: Diagnosis not present

## 2019-01-30 NOTE — Telephone Encounter (Signed)
Patient called and left a message for a return call. I returned the call and patient was concerned about the time of his surgery and being diabetic. I explained that quite a few diabetic patients have surgeries in the afternoon and do very well. I asked him to call same day surgery ( he was given the number [phone]) to get his actual arrival time and if that did not work for him to let me know and he can be rescheduled.Jake Gomez

## 2019-01-31 ENCOUNTER — Ambulatory Visit: Payer: Medicare Other

## 2019-01-31 ENCOUNTER — Ambulatory Visit: Payer: Medicare Other | Admitting: Anesthesiology

## 2019-01-31 ENCOUNTER — Encounter: Payer: Self-pay | Admitting: Anesthesiology

## 2019-01-31 ENCOUNTER — Other Ambulatory Visit: Payer: Self-pay

## 2019-01-31 ENCOUNTER — Encounter
Admission: RE | Disposition: A | Discharge status: Home / Self Care | Payer: Self-pay | Source: Home / Self Care | Attending: Vascular Surgery

## 2019-01-31 ENCOUNTER — Ambulatory Visit
Admission: RE | Admit: 2019-01-31 | Discharge: 2019-01-31 | Disposition: A | Discharge status: Home / Self Care | Payer: Medicare Other | Attending: Vascular Surgery | Admitting: Vascular Surgery

## 2019-01-31 DIAGNOSIS — D631 Anemia in chronic kidney disease: Secondary | ICD-10-CM | POA: Diagnosis not present

## 2019-01-31 DIAGNOSIS — G8918 Other acute postprocedural pain: Secondary | ICD-10-CM | POA: Diagnosis not present

## 2019-01-31 DIAGNOSIS — Z992 Dependence on renal dialysis: Secondary | ICD-10-CM | POA: Insufficient documentation

## 2019-01-31 DIAGNOSIS — Z79899 Other long term (current) drug therapy: Secondary | ICD-10-CM | POA: Diagnosis not present

## 2019-01-31 DIAGNOSIS — N186 End stage renal disease: Secondary | ICD-10-CM | POA: Diagnosis not present

## 2019-01-31 DIAGNOSIS — N185 Chronic kidney disease, stage 5: Secondary | ICD-10-CM | POA: Diagnosis not present

## 2019-01-31 DIAGNOSIS — G4730 Sleep apnea, unspecified: Secondary | ICD-10-CM | POA: Insufficient documentation

## 2019-01-31 DIAGNOSIS — E1122 Type 2 diabetes mellitus with diabetic chronic kidney disease: Secondary | ICD-10-CM | POA: Diagnosis not present

## 2019-01-31 DIAGNOSIS — I120 Hypertensive chronic kidney disease with stage 5 chronic kidney disease or end stage renal disease: Secondary | ICD-10-CM | POA: Insufficient documentation

## 2019-01-31 DIAGNOSIS — M79632 Pain in left forearm: Secondary | ICD-10-CM | POA: Diagnosis not present

## 2019-01-31 DIAGNOSIS — Z87891 Personal history of nicotine dependence: Secondary | ICD-10-CM | POA: Diagnosis not present

## 2019-01-31 DIAGNOSIS — Z794 Long term (current) use of insulin: Secondary | ICD-10-CM | POA: Diagnosis not present

## 2019-01-31 DIAGNOSIS — Z833 Family history of diabetes mellitus: Secondary | ICD-10-CM | POA: Diagnosis not present

## 2019-01-31 HISTORY — PX: AV FISTULA PLACEMENT: SHX1204

## 2019-01-31 LAB — GLUCOSE, CAPILLARY
Glucose-Capillary: 60 mg/dL — ABNORMAL LOW (ref 70–99)
Glucose-Capillary: 71 mg/dL (ref 70–99)

## 2019-01-31 LAB — POCT I-STAT 4, (NA,K, GLUC, HGB,HCT)
Glucose, Bld: 90 mg/dL (ref 70–99)
HCT: 39 % (ref 39.0–52.0)
Hemoglobin: 13.3 g/dL (ref 13.0–17.0)
Potassium: 5.4 mmol/L — ABNORMAL HIGH (ref 3.5–5.1)
Sodium: 139 mmol/L (ref 135–145)

## 2019-01-31 SURGERY — ARTERIOVENOUS (AV) FISTULA CREATION
Anesthesia: General | Laterality: Left

## 2019-01-31 MED ORDER — MIDAZOLAM HCL 2 MG/2ML IJ SOLN
1.0000 mg | Freq: Once | INTRAMUSCULAR | Status: AC
  Administered 2019-01-31: 09:00:00 1 mg via INTRAVENOUS

## 2019-01-31 MED ORDER — CHLORHEXIDINE GLUCONATE CLOTH 2 % EX PADS: 6.0000 | MEDICATED_PAD | Freq: Once | CUTANEOUS | Status: DC

## 2019-01-31 MED ORDER — CEFAZOLIN SODIUM-DEXTROSE 1-4 GM/50ML-% IV SOLN
INTRAVENOUS | Status: AC
  Filled 2019-01-31: qty 50

## 2019-01-31 MED ORDER — BUPIVACAINE HCL (PF) 0.5 % IJ SOLN
INTRAMUSCULAR | Status: AC
  Filled 2019-01-31: qty 30

## 2019-01-31 MED ORDER — ONDANSETRON HCL 4 MG/2ML IJ SOLN
INTRAMUSCULAR | Status: AC
  Filled 2019-01-31: qty 2

## 2019-01-31 MED ORDER — FENTANYL CITRATE (PF) 100 MCG/2ML IJ SOLN: 25.0000 ug | INTRAMUSCULAR | Status: DC | PRN

## 2019-01-31 MED ORDER — PROPOFOL 10 MG/ML IV BOLUS
INTRAVENOUS | Status: AC
  Filled 2019-01-31: qty 20

## 2019-01-31 MED ORDER — FENTANYL CITRATE (PF) 100 MCG/2ML IJ SOLN
50.0000 ug | Freq: Once | INTRAMUSCULAR | Status: AC
  Administered 2019-01-31: 09:00:00 50 ug via INTRAVENOUS

## 2019-01-31 MED ORDER — FENTANYL CITRATE (PF) 100 MCG/2ML IJ SOLN
INTRAMUSCULAR | Status: AC
  Filled 2019-01-31: qty 2

## 2019-01-31 MED ORDER — OXYCODONE HCL 5 MG/5ML PO SOLN: 5.0000 mg | Freq: Once | ORAL | Status: DC | PRN

## 2019-01-31 MED ORDER — FENTANYL CITRATE (PF) 100 MCG/2ML IJ SOLN
INTRAMUSCULAR | Status: AC
  Administered 2019-01-31: 09:00:00 50 ug via INTRAVENOUS
  Filled 2019-01-31: qty 2

## 2019-01-31 MED ORDER — LIDOCAINE HCL (PF) 1 % IJ SOLN
INTRAMUSCULAR | Status: AC
  Filled 2019-01-31: qty 5

## 2019-01-31 MED ORDER — FENTANYL CITRATE (PF) 100 MCG/2ML IJ SOLN
INTRAMUSCULAR | Status: DC | PRN
  Administered 2019-01-31: 25 ug via INTRAVENOUS

## 2019-01-31 MED ORDER — MIDAZOLAM HCL 2 MG/2ML IJ SOLN
INTRAMUSCULAR | Status: AC
  Filled 2019-01-31: qty 2

## 2019-01-31 MED ORDER — DEXTROSE 50 % IV SOLN
25.0000 mL | Freq: Once | INTRAVENOUS | Status: AC
  Administered 2019-01-31: 13:00:00 25 mL via INTRAVENOUS

## 2019-01-31 MED ORDER — PROPOFOL 500 MG/50ML IV EMUL
INTRAVENOUS | Status: AC
  Filled 2019-01-31: qty 50

## 2019-01-31 MED ORDER — FAMOTIDINE 20 MG PO TABS
ORAL_TABLET | ORAL | Status: AC
  Administered 2019-01-31: 20 mg via ORAL
  Filled 2019-01-31: qty 1

## 2019-01-31 MED ORDER — PAPAVERINE HCL 30 MG/ML IJ SOLN
INTRAMUSCULAR | Status: AC
  Filled 2019-01-31: qty 2

## 2019-01-31 MED ORDER — LIDOCAINE HCL (PF) 2 % IJ SOLN
INTRAMUSCULAR | Status: DC | PRN
  Administered 2019-01-31: 80 mg via PERINEURAL
  Administered 2019-01-31: 60 mg via PERINEURAL

## 2019-01-31 MED ORDER — LIDOCAINE HCL (PF) 2 % IJ SOLN
INTRAMUSCULAR | Status: AC
  Filled 2019-01-31: qty 10

## 2019-01-31 MED ORDER — SODIUM CHLORIDE 0.9 % IV SOLN
INTRAVENOUS | Status: DC
  Administered 2019-01-31: 10:00:00 via INTRAVENOUS

## 2019-01-31 MED ORDER — LIDOCAINE HCL (CARDIAC) PF 100 MG/5ML IV SOSY
PREFILLED_SYRINGE | INTRAVENOUS | Status: DC | PRN
  Administered 2019-01-31: 100 mg via INTRAVENOUS

## 2019-01-31 MED ORDER — ROPIVACAINE HCL 5 MG/ML IJ SOLN
INTRAMUSCULAR | Status: AC
  Filled 2019-01-31: qty 30

## 2019-01-31 MED ORDER — FAMOTIDINE 20 MG PO TABS
20.0000 mg | ORAL_TABLET | Freq: Once | ORAL | Status: AC
  Administered 2019-01-31: 10:00:00 20 mg via ORAL

## 2019-01-31 MED ORDER — PROPOFOL 500 MG/50ML IV EMUL
INTRAVENOUS | Status: DC | PRN
  Administered 2019-01-31: 150 ug/kg/min via INTRAVENOUS

## 2019-01-31 MED ORDER — PROPOFOL 10 MG/ML IV BOLUS
INTRAVENOUS | Status: DC | PRN
  Administered 2019-01-31: 50 mg via INTRAVENOUS

## 2019-01-31 MED ORDER — EVICEL 2 ML EX KIT
PACK | CUTANEOUS | Status: DC | PRN
  Administered 2019-01-31: 1

## 2019-01-31 MED ORDER — MIDAZOLAM HCL 2 MG/2ML IJ SOLN
INTRAMUSCULAR | Status: AC
  Administered 2019-01-31: 09:00:00 1 mg via INTRAVENOUS
  Filled 2019-01-31: qty 2

## 2019-01-31 MED ORDER — HYDROCODONE-ACETAMINOPHEN 5-325 MG PO TABS: 1.0000 | ORAL_TABLET | Freq: Four times a day (QID) | ORAL | 0 refills | Status: DC | PRN

## 2019-01-31 MED ORDER — PHENYLEPHRINE HCL (PRESSORS) 10 MG/ML IV SOLN
INTRAVENOUS | Status: DC | PRN
  Administered 2019-01-31 (×3): 50 ug via INTRAVENOUS
  Administered 2019-01-31 (×2): 100 ug via INTRAVENOUS
  Administered 2019-01-31: 50 ug via INTRAVENOUS

## 2019-01-31 MED ORDER — HEPARIN SODIUM (PORCINE) 5000 UNIT/ML IJ SOLN
INTRAMUSCULAR | Status: AC
  Filled 2019-01-31: qty 1

## 2019-01-31 MED ORDER — BUPIVACAINE-EPINEPHRINE (PF) 0.5% -1:200000 IJ SOLN
INTRAMUSCULAR | Status: AC
  Filled 2019-01-31: qty 30

## 2019-01-31 MED ORDER — DEXTROSE 50 % IV SOLN
INTRAVENOUS | Status: AC
  Administered 2019-01-31: 13:00:00 25 mL via INTRAVENOUS
  Filled 2019-01-31: qty 50

## 2019-01-31 MED ORDER — SODIUM CHLORIDE 0.9 % IV SOLN
INTRAVENOUS | Status: DC | PRN
  Administered 2019-01-31: 160 mL via INTRAMUSCULAR

## 2019-01-31 MED ORDER — BUPIVACAINE HCL (PF) 0.5 % IJ SOLN
INTRAMUSCULAR | Status: DC | PRN
  Administered 2019-01-31: 7 mL via PERINEURAL
  Administered 2019-01-31: 16 mL via PERINEURAL

## 2019-01-31 MED ORDER — CEFAZOLIN SODIUM-DEXTROSE 1-4 GM/50ML-% IV SOLN
1.0000 g | INTRAVENOUS | Status: AC
  Administered 2019-01-31: 1 g via INTRAVENOUS

## 2019-01-31 MED ORDER — OXYCODONE HCL 5 MG PO TABS: 5.0000 mg | ORAL_TABLET | Freq: Once | ORAL | Status: DC | PRN

## 2019-01-31 SURGICAL SUPPLY — 51 items
BAG DECANTER FOR FLEXI CONT (MISCELLANEOUS) ×3 IMPLANT
BLADE SURG SZ11 CARB STEEL (BLADE) ×3 IMPLANT
BOOT SUTURE AID YELLOW STND (SUTURE) ×3 IMPLANT
BRUSH SCRUB EZ  4% CHG (MISCELLANEOUS) ×2
BRUSH SCRUB EZ 4% CHG (MISCELLANEOUS) ×1 IMPLANT
CANISTER SUCT 1200ML W/VALVE (MISCELLANEOUS) ×3 IMPLANT
CHLORAPREP W/TINT 26 (MISCELLANEOUS) ×3 IMPLANT
CLIP SPRNG 6MM S-JAW DBL (CLIP) ×3
COVER WAND RF STERILE (DRAPES) ×3 IMPLANT
DERMABOND ADVANCED (GAUZE/BANDAGES/DRESSINGS) ×2
DERMABOND ADVANCED .7 DNX12 (GAUZE/BANDAGES/DRESSINGS) ×1 IMPLANT
ELECT CAUTERY BLADE 6.4 (BLADE) ×3 IMPLANT
ELECT REM PT RETURN 9FT ADLT (ELECTROSURGICAL) ×3
ELECTRODE REM PT RTRN 9FT ADLT (ELECTROSURGICAL) ×1 IMPLANT
EVICEL AIRLESS SPRAY ACCES (MISCELLANEOUS) ×3 IMPLANT
GEL ULTRASOUND 20GR AQUASONIC (MISCELLANEOUS) IMPLANT
GLOVE BIO SURGEON STRL SZ7 (GLOVE) ×6 IMPLANT
GLOVE INDICATOR 7.5 STRL GRN (GLOVE) ×3 IMPLANT
GOWN STRL REUS W/ TWL LRG LVL3 (GOWN DISPOSABLE) ×2 IMPLANT
GOWN STRL REUS W/ TWL XL LVL3 (GOWN DISPOSABLE) ×1 IMPLANT
GOWN STRL REUS W/TWL LRG LVL3 (GOWN DISPOSABLE) ×4
GOWN STRL REUS W/TWL XL LVL3 (GOWN DISPOSABLE) ×2
HEMOSTAT SURGICEL 2X3 (HEMOSTASIS) ×3 IMPLANT
IV NS 500ML (IV SOLUTION) ×2
IV NS 500ML BAXH (IV SOLUTION) ×1 IMPLANT
KIT TURNOVER KIT A (KITS) ×3 IMPLANT
LABEL OR SOLS (LABEL) IMPLANT
LOOP RED MAXI  1X406MM (MISCELLANEOUS) ×4
LOOP VESSEL MAXI 1X406 RED (MISCELLANEOUS) ×2 IMPLANT
LOOP VESSEL MINI 0.8X406 BLUE (MISCELLANEOUS) ×1 IMPLANT
LOOPS BLUE MINI 0.8X406MM (MISCELLANEOUS) ×2
NEEDLE FILTER BLUNT 18X 1/2SAF (NEEDLE) ×2
NEEDLE FILTER BLUNT 18X1 1/2 (NEEDLE) ×1 IMPLANT
NS IRRIG 500ML POUR BTL (IV SOLUTION) ×3 IMPLANT
PACK EXTREMITY ARMC (MISCELLANEOUS) ×3 IMPLANT
PAD PREP 24X41 OB/GYN DISP (PERSONAL CARE ITEMS) ×3 IMPLANT
SOLUTION CELL SAVER (CLIP) ×1 IMPLANT
STOCKINETTE STRL 4IN 9604848 (GAUZE/BANDAGES/DRESSINGS) ×3 IMPLANT
SUT MNCRL AB 4-0 PS2 18 (SUTURE) ×3 IMPLANT
SUT PROLENE 6 0 BV (SUTURE) ×6 IMPLANT
SUT SILK 2 0 (SUTURE) ×2
SUT SILK 2-0 18XBRD TIE 12 (SUTURE) ×1 IMPLANT
SUT SILK 3 0 (SUTURE) ×2
SUT SILK 3-0 18XBRD TIE 12 (SUTURE) ×1 IMPLANT
SUT SILK 4 0 (SUTURE) ×2
SUT SILK 4-0 18XBRD TIE 12 (SUTURE) ×1 IMPLANT
SUT VIC AB 3-0 SH 27 (SUTURE) ×4
SUT VIC AB 3-0 SH 27X BRD (SUTURE) ×2 IMPLANT
SYR 20ML LL LF (SYRINGE) ×3 IMPLANT
SYR 3ML LL SCALE MARK (SYRINGE) ×3 IMPLANT
SYR TB 1ML 27GX1/2 LL (SYRINGE) IMPLANT

## 2019-01-31 NOTE — Discharge Instructions (Signed)
AMBULATORY SURGERY  °DISCHARGE INSTRUCTIONS ° ° °1) The drugs that you were given will stay in your system until tomorrow so for the next 24 hours you should not: ° °A) Drive an automobile °B) Make any legal decisions °C) Drink any alcoholic beverage ° ° °2) You may resume regular meals tomorrow.  Today it is better to start with liquids and gradually work up to solid foods. ° °You may eat anything you prefer, but it is better to start with liquids, then soup and crackers, and gradually work up to solid foods. ° ° °3) Please notify your doctor immediately if you have any unusual bleeding, trouble breathing, redness and pain at the surgery site, drainage, fever, or pain not relieved by medication. ° ° ° °4) Additional Instructions: ° ° ° ° ° ° ° °Please contact your physician with any problems or Same Day Surgery at [phone], Monday through Friday 6 am to 4 pm, or [place] [facility] [place] Main number at [phone].AMBULATORY SURGERY  °DISCHARGE INSTRUCTIONS ° ° °5) The drugs that you were given will stay in your system until tomorrow so for the next 24 hours you should not: ° °D) Drive an automobile °E) Make any legal decisions °F) Drink any alcoholic beverage ° ° °6) You may resume regular meals tomorrow.  Today it is better to start with liquids and gradually work up to solid foods. ° °You may eat anything you prefer, but it is better to start with liquids, then soup and crackers, and gradually work up to solid foods. ° ° °7) Please notify your doctor immediately if you have any unusual bleeding, trouble breathing, redness and pain at the surgery site, drainage, fever, or pain not relieved by medication. ° ° ° °8) Additional Instructions: ° ° ° ° ° ° ° °Please contact your physician with any problems or Same Day Surgery at [phone], Monday through Friday 6 am to 4 pm, or [place] at  Main number at [phone]. °

## 2019-01-31 NOTE — H&P (Signed)
[facility] History & Physical Update  The patient was interviewed and re-examined.  The patient's previous History and Physical has been reviewed and is unchanged.  There is no change in the plan of care. We plan to proceed with the scheduled procedure.  Leotis Pain, MD  01/31/2019, 1:56 PM

## 2019-01-31 NOTE — Transfer of Care (Signed)
Immediate Anesthesia Transfer of Care Note  Patient: Jake Gomez  Procedure(s) Performed: ARTERIOVENOUS (AV) FISTULA CREATION ( BRACHIOCEPHALIC ) (Left )  Patient Location: PACU  Anesthesia Type:General  Level of Consciousness: sedated  Airway & Oxygen Therapy: Patient Spontanous Breathing and Patient connected to face mask oxygen  Post-op Assessment: Report given to RN and Post -op Vital signs reviewed and stable  Post vital signs: Reviewed and stable  Last Vitals:  Vitals Value Taken Time  BP    Temp    Pulse    Resp    SpO2      Last Pain:  Vitals:   01/31/19 0850  TempSrc: Tympanic         Complications: No apparent anesthesia complications

## 2019-01-31 NOTE — Anesthesia Preprocedure Evaluation (Signed)
Anesthesia Evaluation  Patient identified by MRN, date of birth, ID band Patient awake    Reviewed: Allergy & Precautions, H&P , NPO status , Patient's Chart, lab work & pertinent test results  History of Anesthesia Complications Negative for: history of anesthetic complications  Airway Mallampati: III  TM Distance: >3 FB Neck ROM: full    Dental  (+) Chipped, Poor Dentition, Missing   Pulmonary sleep apnea , former smoker,           Cardiovascular hypertension, (-) angina(-) Past MI and (-) DOE      Neuro/Psych negative neurological ROS  negative psych ROS   GI/Hepatic negative GI ROS, Neg liver ROS,   Endo/Other  diabetes, Type 2  Renal/GU DialysisRenal disease  negative genitourinary   Musculoskeletal   Abdominal   Peds  Hematology negative hematology ROS (+)   Anesthesia Other Findings Past Medical History: No date: Diabetes mellitus without complication (HCC) No date: Hypertension No date: Renal disorder No date: Sleep apnea     Comment:  can't afford CPAP  Past Surgical History: 07/19/2018: DIALYSIS/PERMA CATHETER INSERTION; N/A     Comment:  Procedure: DIALYSIS/PERMA CATHETER INSERTION;  Surgeon:               Algernon Huxley, MD;  Location: [facility];                Service: Cardiovascular;  Laterality: N/A; 09/13/2018: DIALYSIS/PERMA CATHETER INSERTION; N/A     Comment:  Procedure: DIALYSIS/PERMA CATHETER INSERTION;  Surgeon:               Katha Cabal, MD;  Location: [place] [facility];               Service: Cardiovascular;  Laterality: N/A; No date: peritonal dialysis 07/17/2018: REMOVAL OF A DIALYSIS CATHETER; N/A     Comment:  Procedure: REMOVAL OF A DIALYSIS CATHETER;  Surgeon:               Algernon Huxley, MD;  Location: [facility];  Service:               Vascular;  Laterality: N/A;  BMI    Body Mass Index: 37.42 kg/m      Reproductive/Obstetrics negative OB ROS                              Anesthesia Physical Anesthesia Plan  ASA: III  Anesthesia Plan: General   Post-op Pain Management:    Induction: Intravenous  PONV Risk Score and Plan: Propofol infusion and TIVA  Airway Management Planned: Natural Airway and Nasal Cannula  Additional Equipment:   Intra-op Plan:   Post-operative Plan:   Informed Consent: I have reviewed the patients History and Physical, chart, labs and discussed the procedure including the risks, benefits and alternatives for the proposed anesthesia with the patient or authorized representative who has indicated his/her understanding and acceptance.     Dental Advisory Given  Plan Discussed with: Anesthesiologist, CRNA and Surgeon  Anesthesia Plan Comments: (Patient consented for risks of anesthesia including but not limited to:  - adverse reactions to medications - risk of intubation if required - damage to teeth, lips or other oral mucosa - sore throat or hoarseness - Damage to heart, brain, lungs or loss of life  Patient voiced understanding.)        Anesthesia Quick Evaluation

## 2019-01-31 NOTE — Anesthesia Procedure Notes (Signed)
Anesthesia Regional Block: Supraclavicular block   Pre-Anesthetic Checklist: ,, timeout performed, Correct Patient, Correct Site, Correct Laterality, Correct Procedure, Correct Position, site marked, Risks and benefits discussed,  Surgical consent,  Pre-op evaluation,  At surgeon's request and post-op pain management  Laterality: Upper and Left  Prep: chloraprep       Needles:  Injection technique: Single-shot  Needle Type: Stimiplex     Needle Length: 5cm  Needle Gauge: 22     Additional Needles:   Procedures:,,,, ultrasound used (permanent image in chart),,,,  Narrative:  Start time: 01/31/2019 9:14 AM End time: 01/31/2019 9:20 AM Injection made incrementally with aspirations every 5 mL.  Performed by: Personally  Anesthesiologist: Claryce Friel, Precious Haws, MD  Additional Notes: Patient consented for risk and benefits of nerve block including but not limited to nerve damage, failed block, bleeding and infection.  Patient voiced understanding.  Functioning IV was confirmed and monitors were applied.  A 58mm 22ga Stimuplex needle was used. Sterile prep,hand hygiene and sterile gloves were used.  Minimal sedation used for procedure.  No paresthesia endorsed by patient during the procedure.  Negative aspiration and negative test dose prior to incremental administration of local anesthetic. The patient tolerated the procedure well with no immediate complications.  Intercostal brachial block on left arm as well

## 2019-01-31 NOTE — Anesthesia Postprocedure Evaluation (Signed)
Anesthesia Post Note  Patient: Zalyn Sannicolas  Procedure(s) Performed: ARTERIOVENOUS (AV) FISTULA CREATION ( BRACHIOCEPHALIC ) (Left )  Patient location during evaluation: PACU Anesthesia Type: General Level of consciousness: awake and alert Pain management: pain level controlled Vital Signs Assessment: post-procedure vital signs reviewed and stable Respiratory status: spontaneous breathing, nonlabored ventilation, respiratory function stable and patient connected to nasal cannula oxygen Cardiovascular status: blood pressure returned to baseline and stable Postop Assessment: no apparent nausea or vomiting Anesthetic complications: no     Last Vitals:  Vitals:   01/31/19 1600 01/31/19 1610  BP: (!) 162/80 (!) 157/75  Pulse: 68 70  Resp: 17 20  Temp: (!) 36.1 C (!) 36.1 C  SpO2: 97% 97%    Last Pain:  Vitals:   01/31/19 1610  TempSrc: Temporal  PainSc: 0-No pain                 Martha Clan

## 2019-01-31 NOTE — Anesthesia Post-op Follow-up Note (Signed)
Anesthesia QCDR form completed.        

## 2019-01-31 NOTE — H&P (Signed)
[facility] Admission History & Physical  MRN : [id number]  Jake Gomez is a 48 y.o. (1970/07/29) male who presents with chief complaint of No chief complaint on file. Marland Kitchen  History of Present Illness: Patient presents today for creation of his dialysis access.  He was started on dialysis about 3 to 4 months ago.  He is currently using his PermCath without event.  No complaints or symptoms today.  Preoperative vein mapping shows adequate anatomy for left brachiocephalic AV fistula  Current Facility-Administered Medications  Medication Dose Route Frequency Provider Last Rate Last Dose  . 0.9 %  sodium chloride infusion   Intravenous Continuous Alvin Critchley, MD 10 mL/hr at 01/31/19 [phone]    . ceFAZolin (ANCEF) 1-4 GM/50ML-% IVPB           . ceFAZolin (ANCEF) IVPB 1 g/50 mL premix  1 g Intravenous On Call to [place], [place], NP      . Chlorhexidine Gluconate Cloth 2 % PADS 6 each  6 each Topical Once Kris Hartmann, NP       And  . Chlorhexidine Gluconate Cloth 2 % PADS 6 each  6 each Topical Once Kris Hartmann, NP       Facility-Administered Medications Ordered in Other Encounters  Medication Dose Route Frequency Provider Last Rate Last Dose  . bupivacaine (MARCAINE) 0.5 % injection    Anesthesia Intra-op Piscitello, Precious Haws, MD   16 mL at 01/31/19 0920  . lidocaine (cardiac) 100 mg/76mL (XYLOCAINE) injection 2%    Anesthesia Intra-op Caryl Asp, CRNA   100 mg at 01/31/19 1359  . lidocaine (XYLOCAINE) 2 % injection    Anesthesia Intra-op Piscitello, Precious Haws, MD   80 mg at 01/31/19 0920  . propofol (DIPRIVAN) 10 mg/mL bolus/IV push    Anesthesia Intra-op Caryl Asp, CRNA   50 mg at 01/31/19 1402  . propofol (DIPRIVAN) 500 MG/50ML infusion    Continuous PRN Rosebud Poles C, CRNA 100.4 mL/hr at 01/31/19 1404 150 mcg/kg/min at 01/31/19 1404    Past Medical History:  Diagnosis Date  . Diabetes mellitus without complication ([place])   . Hypertension    . Renal disorder   . Sleep apnea    can't afford CPAP    Past Surgical History:  Procedure Laterality Date  . DIALYSIS/PERMA CATHETER INSERTION N/A 07/19/2018   Procedure: DIALYSIS/PERMA CATHETER INSERTION;  Surgeon: Algernon Huxley, MD;  Location: [facility];  Service: Cardiovascular;  Laterality: N/A;  . DIALYSIS/PERMA CATHETER INSERTION N/A 09/13/2018   Procedure: DIALYSIS/PERMA CATHETER INSERTION;  Surgeon: Katha Cabal, MD;  Location: [facility];  Service: Cardiovascular;  Laterality: N/A;  . peritonal dialysis    . REMOVAL OF A DIALYSIS CATHETER N/A 07/17/2018   Procedure: REMOVAL OF A DIALYSIS CATHETER;  Surgeon: Algernon Huxley, MD;  Location: [facility];  Service: Vascular;  Laterality: N/A;    Social History Social History   Tobacco Use  . Smoking status: Former Smoker    Quit date: 11/14/2009    Years since quitting: 9.2  . Smokeless tobacco: Former Systems developer    Quit date: 07/16/2009  Substance Use Topics  . Alcohol use: No    Frequency: Never  . Drug use: Not Currently    Comment: history 10 years ago   No urine production    Family History Family History  Problem Relation Age of Onset  . Thyroid disease Mother   . Stroke Father   .  Cancer Father   . Diabetes Father    No Known Allergies   REVIEW OF SYSTEMS (Negative unless checked)  Constitutional: [] Weight loss  [] Fever  [] Chills Cardiac: [] Chest pain   [] Chest pressure   [] Palpitations   [] Shortness of breath when laying flat   [] Shortness of breath at rest   [] Shortness of breath with exertion. Vascular:  [] Pain in legs with walking   [] Pain in legs at rest   [] Pain in legs when laying flat   [] Claudication   [] Pain in feet when walking  [] Pain in feet at rest  [] Pain in feet when laying flat   [] History of DVT   [] Phlebitis   [] Swelling in legs   [] Varicose veins   [] Non-healing ulcers Pulmonary:   [] Uses home oxygen   [] Productive cough   [] Hemoptysis   [] Wheeze  [] COPD   [] Asthma Neurologic:   [] Dizziness  [] Blackouts   [] Seizures   [] History of stroke   [] History of TIA  [] Aphasia   [] Temporary blindness   [] Dysphagia   [] Weakness or numbness in arms   [] Weakness or numbness in legs Musculoskeletal:  [x] Arthritis   [] Joint swelling   [] Joint pain   [] Low back pain Hematologic:  [] Easy bruising  [] Easy bleeding   [] Hypercoagulable state   [x] Anemic  [] Hepatitis Gastrointestinal:  [] Blood in stool   [] Vomiting blood  [] Gastroesophageal reflux/heartburn   [] Difficulty swallowing. Genitourinary:  [x] Chronic kidney disease   [] Difficult urination  [] Frequent urination  [] Burning with urination   [] Blood in urine Skin:  [] Rashes   [] Ulcers   [] Wounds Psychological:  [] History of anxiety   []  History of major depression.  Physical Examination  Vitals:   01/31/19 0903 01/31/19 0914 01/31/19 0919 01/31/19 0924  BP: (!) 164/85 (!) 164/85 139/77 135/81  Pulse: 78 78 72 74  Resp: (!) 26 (!) 26 17 16   Temp:      TempSrc:      SpO2: 99% 99% 99% 98%  Weight:      Height:       Body mass index is 37.42 kg/m. Gen: WD/WN, NAD Head: [place]/AT, No temporalis wasting. Ear/Nose/Throat: Hearing grossly intact, nares w/o erythema or drainage, oropharynx w/o Erythema/Exudate,  Eyes: Conjunctiva clear, sclera non-icteric Neck: Trachea midline.  No JVD.  Pulmonary:  Good air movement, respirations not labored, no use of accessory muscles.  Cardiac: RRR, normal S1, S2. Vascular:  Vessel Right Left  Radial Palpable Palpable   Musculoskeletal: M/S 5/5 throughout.  Extremities without ischemic changes.  No deformity or atrophy.  Neurologic: Sensation grossly intact in extremities.  Symmetrical.  Speech is fluent. Motor exam as listed above. Psychiatric: Judgment intact, Mood & affect appropriate for pt's clinical situation. Dermatologic: No rashes or ulcers noted.  No cellulitis or open wounds.      CBC Lab Results  Component Value Date   WBC 5.8 01/26/2019   HGB 13.3 01/31/2019   HCT 39.0  01/31/2019   MCV 101.7 (H) 01/26/2019   PLT 149 (L) 01/26/2019    BMET    Component Value Date/Time   NA 139 01/31/2019 0848   NA 142 04/30/2014 1244   K 5.4 (H) 01/31/2019 0848   K 4.3 04/30/2014 1244   CL 101 01/26/2019 1108   CL 109 (H) 04/30/2014 1244   CO2 25 01/26/2019 1108   CO2 25 04/30/2014 1244   GLUCOSE 90 01/31/2019 0848   GLUCOSE 94 04/30/2014 1244   BUN 69 (H) 01/26/2019 1108   BUN 61 (H) 04/30/2014 1244  CREATININE 9.81 (H) 01/26/2019 1108   CREATININE 4.40 (H) 04/30/2014 1244   CALCIUM 8.7 (L) 01/26/2019 1108   CALCIUM 7.8 (L) 04/30/2014 1244   GFRNONAA 6 (L) 01/26/2019 1108   GFRNONAA 16 (L) 04/30/2014 1244   GFRAA 7 (L) 01/26/2019 1108   GFRAA 19 (L) 04/30/2014 1244   Estimated Creatinine Clearance: 11.4 mL/min (A) (by C-G formula based on SCr of 9.81 mg/dL (H)).  COAG Lab Results  Component Value Date   INR 1.0 01/26/2019   INR 1.0 11/15/2018   INR 1.07 07/17/2018    Radiology [place] Or Nerve Block-image Only ([facility])  Result Date: 01/31/2019 There is no interpretation for this exam.  This order is for images obtained during a surgical procedure.  Please See "Surgeries" Tab for more information regarding the procedure.      Assessment/Plan 1.  ESRD.  For left brachiocephalic AV fistula creation today.  Risks and benefits are discussed. 2.  Diabetes mellitus.  This is an underlying cause of his renal failure and blood glucose control important in reducing the progression of atherosclerotic disease. Also, involved in wound healing. On appropriate medications. 3.  Hypertension.  This is an underlying cause of his renal failure and blood pressure control important in reducing the progression of atherosclerotic disease. On appropriate oral medications.    Leotis Pain, MD  01/31/2019 2:06 PM

## 2019-01-31 NOTE — Progress Notes (Signed)
Per Dr Amie Critchley potassium of 5.4 is ok to proceed.

## 2019-01-31 NOTE — Op Note (Signed)
[facility]   OPERATIVE NOTE   PROCEDURE: Left brachiocephalic arteriovenous fistula placement  PRE-OPERATIVE DIAGNOSIS: 1.  ESRD        POST-OPERATIVE DIAGNOSIS: 1. ESRD       SURGEON: Leotis Pain, MD  ASSISTANT(S): none  ANESTHESIA: general  ESTIMATED BLOOD LOSS: 20 cc  FINDING(S): Adequate cephalic vein for fistula creation  SPECIMEN(S):  none  INDICATIONS:   Jake Gomez is a 48 y.o. male who presents with renal failure in need of pemanent dialysis acces.  The patient is scheduled for left arm AVF placement.  The patient is aware the risks include but are not limited to: bleeding, infection, steal syndrome, nerve damage, ischemic monomelic neuropathy, failure to mature, and need for additional procedures.  The patient is aware of the risks of the procedure and elects to proceed forward.  DESCRIPTION: After full informed written consent was obtained from the patient, the patient was brought back to the operating room and placed supine upon the operating table.  Prior to induction, the patient received IV antibiotics.   After obtaining adequate anesthesia, the patient was then prepped and draped in the standard fashion for a left arm access procedure.  I made a curvilinear incision at the level of the antecubital fossa and dissected through the subcutaneous tissue and fascia to gain exposure of the brachial artery.  This was noted to be patent and adequate in size for fistula creation.  This was dissected out proximally and distally and prepared for control with vessel loops .  I then dissected out the cephalic vein.  This was noted to be patent and adequate in size for fistula creation.  I then gave the patient 3000 units of intravenous heparin.  The vein was marked for orientation and the distal segment of the vein was ligated with a  2-0 silk, and the vein was transected.  The vein was opened into a large lateral branch from the distal edge which was also  ligated distally creating a wide fishmouth opening for the anastomosis.  I then instilled the heparinized saline into the vein and clamped it.  At this point, I reset my exposure of the brachial artery and pulled up control on the vessel loops.  I made an arteriotomy with a #11 blade, and then I extended the arteriotomy with a Potts scissor.  I injected heparinized saline proximal and distal to this arteriotomy.  The vein was then sewn to the artery in an end-to-side configuration with a running stitch of 6-0 Prolene.  Prior to completing this anastomosis, I allowed the vein and artery to backbleed.  There was no evidence of clot from any vessels.  I completed the anastomosis in the usual fashion and then released all vessel loops and clamps.  There was a palpable  thrill in the venous outflow, and there was a palpable pulse in the artery distal to the anastomosis.  At this point, I irrigated out the surgical wound.  Surgicel was placed. There was no further active bleeding.  The subcutaneous tissue was reapproximated with a running stitch of 3-0 Vicryl.  The skin was then closed with a 4-0 Monocryl suture.  The skin was then cleaned, dried, and reinforced with Dermabond.  The patient tolerated this procedure well and was taken to the recovery room in stable condition  COMPLICATIONS: None  CONDITION: Stable   Leotis Pain    01/31/2019, 3:12 PM  This note was created with Dragon Medical transcription system. Any errors in  dictation are purely unintentional.

## 2019-02-01 DIAGNOSIS — D509 Iron deficiency anemia, unspecified: Secondary | ICD-10-CM | POA: Diagnosis not present

## 2019-02-01 DIAGNOSIS — N186 End stage renal disease: Secondary | ICD-10-CM | POA: Diagnosis not present

## 2019-02-01 DIAGNOSIS — Z992 Dependence on renal dialysis: Secondary | ICD-10-CM | POA: Diagnosis not present

## 2019-02-01 DIAGNOSIS — D631 Anemia in chronic kidney disease: Secondary | ICD-10-CM | POA: Diagnosis not present

## 2019-02-02 DIAGNOSIS — I120 Hypertensive chronic kidney disease with stage 5 chronic kidney disease or end stage renal disease: Secondary | ICD-10-CM | POA: Diagnosis not present

## 2019-02-02 DIAGNOSIS — Z48812 Encounter for surgical aftercare following surgery on the circulatory system: Secondary | ICD-10-CM | POA: Diagnosis not present

## 2019-02-02 DIAGNOSIS — Z7984 Long term (current) use of oral hypoglycemic drugs: Secondary | ICD-10-CM | POA: Diagnosis not present

## 2019-02-02 DIAGNOSIS — I10 Essential (primary) hypertension: Secondary | ICD-10-CM | POA: Diagnosis not present

## 2019-02-02 DIAGNOSIS — E119 Type 2 diabetes mellitus without complications: Secondary | ICD-10-CM | POA: Diagnosis not present

## 2019-02-02 DIAGNOSIS — N186 End stage renal disease: Secondary | ICD-10-CM | POA: Diagnosis not present

## 2019-02-02 DIAGNOSIS — Z8619 Personal history of other infectious and parasitic diseases: Secondary | ICD-10-CM | POA: Diagnosis not present

## 2019-02-02 DIAGNOSIS — D649 Anemia, unspecified: Secondary | ICD-10-CM | POA: Diagnosis not present

## 2019-02-02 DIAGNOSIS — Z992 Dependence on renal dialysis: Secondary | ICD-10-CM | POA: Diagnosis not present

## 2019-02-03 DIAGNOSIS — D631 Anemia in chronic kidney disease: Secondary | ICD-10-CM | POA: Diagnosis not present

## 2019-02-03 DIAGNOSIS — Z992 Dependence on renal dialysis: Secondary | ICD-10-CM | POA: Diagnosis not present

## 2019-02-03 DIAGNOSIS — N186 End stage renal disease: Secondary | ICD-10-CM | POA: Diagnosis not present

## 2019-02-03 DIAGNOSIS — D509 Iron deficiency anemia, unspecified: Secondary | ICD-10-CM | POA: Diagnosis not present

## 2019-02-06 DIAGNOSIS — D631 Anemia in chronic kidney disease: Secondary | ICD-10-CM | POA: Diagnosis not present

## 2019-02-06 DIAGNOSIS — D509 Iron deficiency anemia, unspecified: Secondary | ICD-10-CM | POA: Diagnosis not present

## 2019-02-06 DIAGNOSIS — Z992 Dependence on renal dialysis: Secondary | ICD-10-CM | POA: Diagnosis not present

## 2019-02-06 DIAGNOSIS — N186 End stage renal disease: Secondary | ICD-10-CM | POA: Diagnosis not present

## 2019-02-07 DIAGNOSIS — N186 End stage renal disease: Secondary | ICD-10-CM | POA: Diagnosis not present

## 2019-02-07 DIAGNOSIS — D649 Anemia, unspecified: Secondary | ICD-10-CM | POA: Diagnosis not present

## 2019-02-07 DIAGNOSIS — E8779 Other fluid overload: Secondary | ICD-10-CM | POA: Diagnosis not present

## 2019-02-07 DIAGNOSIS — Z992 Dependence on renal dialysis: Secondary | ICD-10-CM | POA: Diagnosis not present

## 2019-02-07 DIAGNOSIS — E119 Type 2 diabetes mellitus without complications: Secondary | ICD-10-CM | POA: Diagnosis not present

## 2019-02-07 DIAGNOSIS — I120 Hypertensive chronic kidney disease with stage 5 chronic kidney disease or end stage renal disease: Secondary | ICD-10-CM | POA: Diagnosis not present

## 2019-02-07 DIAGNOSIS — E8770 Fluid overload, unspecified: Secondary | ICD-10-CM | POA: Diagnosis not present

## 2019-02-07 DIAGNOSIS — Z48812 Encounter for surgical aftercare following surgery on the circulatory system: Secondary | ICD-10-CM | POA: Diagnosis not present

## 2019-02-08 ENCOUNTER — Telehealth (INDEPENDENT_AMBULATORY_CARE_PROVIDER_SITE_OTHER): Payer: Self-pay | Admitting: Vascular Surgery

## 2019-02-08 DIAGNOSIS — Z992 Dependence on renal dialysis: Secondary | ICD-10-CM | POA: Diagnosis not present

## 2019-02-08 DIAGNOSIS — N186 End stage renal disease: Secondary | ICD-10-CM | POA: Diagnosis not present

## 2019-02-08 DIAGNOSIS — D631 Anemia in chronic kidney disease: Secondary | ICD-10-CM | POA: Diagnosis not present

## 2019-02-08 DIAGNOSIS — D509 Iron deficiency anemia, unspecified: Secondary | ICD-10-CM | POA: Diagnosis not present

## 2019-02-08 NOTE — Telephone Encounter (Signed)
Martinique Dorset-RN with [facility] [phone]) left message stating that patient was seen today and his incision of the fistula looks good with small amount of swelling. Some redness but no pain. Dermabond is still in tac. Patient is complaining of rash on LUE. Rash is itchy. Patient taking Benadryl for the rash and itching. BP is elevated at 152/98. Patient weight is 248.6lb. Otherwise patient is asymptomatic.

## 2019-02-08 NOTE — Telephone Encounter (Signed)
Called Martinique and advised her of the below. Per [place]- she says patient said he has been working and driving and may no longer meet medicare home bound certification. Her director is supposed to contact patient today and will make the decision on if they will d/c patient.  She said patient is over using arm. Arna Medici is aware of this.   Can you please contact patient to schedule a follow up visit with HDA in 4-6 weeks per Arna Medici. AS, CMA

## 2019-02-08 NOTE — Telephone Encounter (Signed)
The rash could be due to some of the dressings that are used after the procedure.  The patient can continue taking Benadryl but he should also try to apply cortisone cream over the area as well.  We should also get the patient in for follow-up visit with an HDA in the next 4 to 6 weeks.

## 2019-02-10 DIAGNOSIS — N186 End stage renal disease: Secondary | ICD-10-CM | POA: Diagnosis not present

## 2019-02-10 DIAGNOSIS — D509 Iron deficiency anemia, unspecified: Secondary | ICD-10-CM | POA: Diagnosis not present

## 2019-02-10 DIAGNOSIS — Z992 Dependence on renal dialysis: Secondary | ICD-10-CM | POA: Diagnosis not present

## 2019-02-10 DIAGNOSIS — D631 Anemia in chronic kidney disease: Secondary | ICD-10-CM | POA: Diagnosis not present

## 2019-02-12 DIAGNOSIS — N186 End stage renal disease: Secondary | ICD-10-CM | POA: Diagnosis not present

## 2019-02-12 DIAGNOSIS — Z992 Dependence on renal dialysis: Secondary | ICD-10-CM | POA: Diagnosis not present

## 2019-02-13 ENCOUNTER — Encounter: Payer: Self-pay | Admitting: Emergency Medicine

## 2019-02-13 ENCOUNTER — Other Ambulatory Visit: Payer: Self-pay

## 2019-02-13 ENCOUNTER — Emergency Department
Admission: EM | Admit: 2019-02-13 | Discharge: 2019-02-13 | Disposition: A | Discharge status: Home / Self Care | Payer: Medicare Other | Attending: Emergency Medicine | Admitting: Emergency Medicine

## 2019-02-13 DIAGNOSIS — Z992 Dependence on renal dialysis: Secondary | ICD-10-CM | POA: Diagnosis not present

## 2019-02-13 DIAGNOSIS — Z20828 Contact with and (suspected) exposure to other viral communicable diseases: Secondary | ICD-10-CM | POA: Diagnosis present

## 2019-02-13 DIAGNOSIS — Z794 Long term (current) use of insulin: Secondary | ICD-10-CM | POA: Insufficient documentation

## 2019-02-13 DIAGNOSIS — I120 Hypertensive chronic kidney disease with stage 5 chronic kidney disease or end stage renal disease: Secondary | ICD-10-CM | POA: Diagnosis not present

## 2019-02-13 DIAGNOSIS — N186 End stage renal disease: Secondary | ICD-10-CM | POA: Diagnosis not present

## 2019-02-13 DIAGNOSIS — Z79899 Other long term (current) drug therapy: Secondary | ICD-10-CM | POA: Diagnosis not present

## 2019-02-13 DIAGNOSIS — E1122 Type 2 diabetes mellitus with diabetic chronic kidney disease: Secondary | ICD-10-CM | POA: Diagnosis not present

## 2019-02-13 DIAGNOSIS — Z8619 Personal history of other infectious and parasitic diseases: Secondary | ICD-10-CM

## 2019-02-13 DIAGNOSIS — Z87891 Personal history of nicotine dependence: Secondary | ICD-10-CM | POA: Diagnosis not present

## 2019-02-13 DIAGNOSIS — U071 COVID-19: Secondary | ICD-10-CM | POA: Diagnosis not present

## 2019-02-13 DIAGNOSIS — Z8616 Personal history of COVID-19: Secondary | ICD-10-CM

## 2019-02-13 LAB — SARS CORONAVIRUS 2 BY RT PCR (HOSPITAL ORDER, PERFORMED IN ~~LOC~~ HOSPITAL LAB): SARS Coronavirus 2: POSITIVE — AB

## 2019-02-13 NOTE — ED Triage Notes (Signed)
Dialysis patient needs covid test due to exposure.  No symptoms

## 2019-02-13 NOTE — Discharge Instructions (Addendum)
Your COVID testing still in process.

## 2019-02-13 NOTE — ED Provider Notes (Signed)
[facility] Emergency Department Provider Note  ____________________________________________  Time seen: Approximately 12:01 PM  I have reviewed the triage vital signs and the nursing notes.   HISTORY  Chief Complaint Labs Only    HPI Jake Gomez is a 48 y.o. male that presents to the emergency department for a COVID test.  Patient went to his dialysis appointment this morning and said that he was exposed to somebody with cold symptoms so he was referred to the emergency department for a COVID test.  Patient states that this contact just found out that her COVID test was negative.  Patient had COVID in June.  Patient is asymptomatic.  He is supposed to return to dialysis at 1230 with negative COVID test.   Past Medical History:  Diagnosis Date  . Diabetes mellitus without complication ([place])   . Hypertension   . Renal disorder   . Sleep apnea    can't afford CPAP    Patient Active Problem List   Diagnosis Date Noted  . Sepsis ([place]) 07/11/2018  . SBP (spontaneous bacterial peritonitis) ([place]) 05/25/2018  . Intractable abdominal pain 05/02/2018  . Peritonitis ([place]) 05/02/2018  . Chest pain 05/26/2017  . ESRD on peritoneal dialysis ([place]) 05/26/2017  . Chronic renal disease, stage 4, severely decreased glomerular filtration rate between 15-29 mL/min/1.73 square meter ([place]) 01/30/2014  . Acute-on-chronic renal failure ([place]) 09/29/2013  . Diabetes mellitus ([place]) 09/29/2013  . Hyperkalemia, diminished renal excretion 09/29/2013  . Hypertension 09/29/2013    Past Surgical History:  Procedure Laterality Date  . AV FISTULA PLACEMENT Left 01/31/2019   Procedure: ARTERIOVENOUS (AV) FISTULA CREATION ( BRACHIOCEPHALIC );  Surgeon: Algernon Huxley, MD;  Location: [facility];  Service: Vascular;  Laterality: Left;  . DIALYSIS/PERMA CATHETER INSERTION N/A 07/19/2018   Procedure: DIALYSIS/PERMA CATHETER INSERTION;  Surgeon: Algernon Huxley, MD;  Location: [facility];  Service: Cardiovascular;  Laterality: N/A;  . DIALYSIS/PERMA CATHETER INSERTION N/A 09/13/2018   Procedure: DIALYSIS/PERMA CATHETER INSERTION;  Surgeon: Katha Cabal, MD;  Location: [facility];  Service: Cardiovascular;  Laterality: N/A;  . peritonal dialysis    . REMOVAL OF A DIALYSIS CATHETER N/A 07/17/2018   Procedure: REMOVAL OF A DIALYSIS CATHETER;  Surgeon: Algernon Huxley, MD;  Location: [facility];  Service: Vascular;  Laterality: N/A;    Prior to Admission medications   Medication Sig Start Date End Date Taking? Authorizing Provider  acetaminophen (TYLENOL) 325 MG tablet Take 1,000 mg by mouth every 6 (six) hours as needed for mild pain.    [provider]  amLODipine (NORVASC) 10 MG tablet Take 1 tablet (10 mg total) by mouth daily. Patient taking differently: Take 10 mg by mouth daily. 10mg  at night and 5mg  in the am- nephrology 07/23/18   Otila Back, MD  atorvastatin (LIPITOR) 80 MG tablet Take 1 tablet (80 mg total) by mouth every morning. 10/17/18   Juline Patch, MD  calcium acetate (PHOSLO) 667 MG capsule Take 2,001 mg by mouth 3 (three) times daily with meals.  05/27/17   [provider]  carvedilol (COREG) 12.5 MG tablet Take 1 tablet (12.5 mg total) by mouth 2 (two) times daily with a meal. 10/17/18   Juline Patch, MD  docusate sodium (COLACE) 100 MG capsule Take 1 capsule (100 mg total) by mouth 2 (two) times daily. 05/05/18   Vaughan Basta, MD  folic acid (FOLVITE) 1 MG tablet Take 1 tablet (1 mg total) by mouth daily.  05/06/18   Vaughan Basta, MD  gabapentin (NEURONTIN) 300 MG capsule Take 300 mg by mouth daily.    [provider]  glipiZIDE (GLUCOTROL) 5 MG tablet Take 1 tablet (5 mg total) by mouth 2 (two) times daily before a meal. 10/17/18   Juline Patch, MD  hydrALAZINE (APRESOLINE) 25 MG tablet Take 25 mg by mouth 3 (three) times daily. nephrology 09/28/18   [provider]  HYDROcodone-acetaminophen  (NORCO) 5-325 MG tablet Take 1 tablet by mouth every 6 (six) hours as needed for moderate pain. 01/31/19   Algernon Huxley, MD  insulin regular (NOVOLIN R RELION) 100 units/mL injection Inject subcutaneously daily as directed on the following sliding scale: 2 units for blood glucose reading equal to or greater than 150 and 1 unit for each blood glucose reading of 50. 05/27/18   Bettey Costa, MD  Insulin Syringes, Disposable, U-100 1 ML MISC 1 each by Does not apply route daily. 12/29/17   Juline Patch, MD  irbesartan (AVAPRO) 300 MG tablet Take 300 mg by mouth at bedtime.  05/14/17   [provider]  polyethylene glycol (MIRALAX / GLYCOLAX) packet Take 17 g by mouth daily. 05/06/18   Vaughan Basta, MD  RELION INSULIN SYRINGE 1ML/31G 31G X 5/16" 1 ML MISC 1 EACH BY DOSE NOT APPLY ROUTE DAILY 12/29/17   [provider]  torsemide (DEMADEX) 100 MG tablet Take 100 mg by mouth daily.    [provider]  traZODone (DESYREL) 50 MG tablet Take 50 mg by mouth at bedtime. Dr Candiss Norse 01/09/18   [provider]  vitamin B-12 1000 MCG tablet Take 1 tablet (1,000 mcg total) by mouth daily. 05/06/18   Vaughan Basta, MD    Allergies Patient has no known allergies.  Family History  Problem Relation Age of Onset  . Thyroid disease Mother   . Stroke Father   . Cancer Father   . Diabetes Father     Social History Social History   Tobacco Use  . Smoking status: Former Smoker    Quit date: 11/14/2009    Years since quitting: 9.2  . Smokeless tobacco: Former Systems developer    Quit date: 07/16/2009  Substance Use Topics  . Alcohol use: No    Frequency: Never  . Drug use: Not Currently    Comment: history 10 years ago   No urine production     Review of Systems  Constitutional: No fever/chills ENT: No upper respiratory complaints. Cardiovascular: No chest pain. Respiratory: No cough.  No SOB. Gastrointestinal: No vomiting.  Musculoskeletal: Negative for  musculoskeletal pain. Skin: Negative for rash, abrasions, lacerations, ecchymosis.   ____________________________________________   PHYSICAL EXAM:  VITAL SIGNS: ED Triage Vitals  Enc Vitals Group     BP 02/13/19 1030 (!) 144/63     Pulse Rate 02/13/19 1030 70     Resp 02/13/19 1030 16     Temp 02/13/19 1030 98 F (36.7 C)     Temp Source 02/13/19 1030 Oral     SpO2 02/13/19 1030 97 %     Weight 02/13/19 1031 242 lb 8.1 oz (110 kg)     Height 02/13/19 1031 5\' 8"  (1.727 m)     Head Circumference --      Peak Flow --      Pain Score 02/13/19 1031 0     Pain Loc --      Pain Edu? --      Excl. in [place]? --  Constitutional: Alert and oriented. Well appearing and in no acute distress. Eyes: Conjunctivae are normal. PERRL. EOMI. Head: Atraumatic. ENT:      Ears:      Nose: No congestion/rhinnorhea.      Mouth/Throat: Mucous membranes are moist.  Neck: No stridor.  Cardiovascular: Good peripheral circulation. Respiratory: Normal respiratory effort without tachypnea or retractions.  Musculoskeletal: Full range of motion to all extremities. No gross deformities appreciated. Neurologic:  Normal speech and language. No gross focal neurologic deficits are appreciated.  Skin:  Skin is warm, dry and intact. No rash noted. Psychiatric: Mood and affect are normal. Speech and behavior are normal. Patient exhibits appropriate insight and judgement.   ____________________________________________   LABS (all labs ordered are listed, but only abnormal results are displayed)  Labs Reviewed  SARS CORONAVIRUS 2 (HOSPITAL ORDER, [facility]) - Abnormal; Notable for the following components:      Result Value   SARS Coronavirus 2 POSITIVE (*)    All other components within normal limits   ____________________________________________  EKG   ____________________________________________  RADIOLOGY   No results  found.  ____________________________________________    PROCEDURES  Procedure(s) performed:    Procedures    Medications - No data to display   ____________________________________________   INITIAL IMPRESSION / ASSESSMENT AND PLAN / ED COURSE  Pertinent labs & imaging results that were available during my care of the patient were reviewed by me and considered in my medical decision making (see chart for details).  Review of the [place] CSRS was performed in accordance of the [place] prior to dispensing any controlled drugs.   Patient presented to the emergency department for a COVID test to go to dialysis.  Vital signs and exam are reassuring.  COVID test is positive.  Patient is asymptomatic.  Dialysis center was notified by Jacques Navy.  Patient is given ED precautions to return to the ED for any worsening or new symptoms.  Silvestro Blumenschein was evaluated in Emergency Department on 02/13/2019 for the symptoms described in the history of present illness. He was evaluated in the context of the global COVID-19 pandemic, which necessitated consideration that the patient might be at risk for infection with the SARS-CoV-2 virus that causes COVID-19. Institutional protocols and algorithms that pertain to the evaluation of patients at risk for COVID-19 are in a state of rapid change based on information released by regulatory bodies including the CDC and federal and state organizations. These policies and algorithms were followed during the patient's care in the ED.  ____________________________________________  FINAL CLINICAL IMPRESSION(S) / ED DIAGNOSES  Final diagnoses:  History of 2019 novel coronavirus disease (COVID-19)  2019 novel coronavirus detected      NEW MEDICATIONS STARTED DURING THIS VISIT:  ED Discharge Orders    None          This chart was dictated using voice recognition software/Dragon. Despite best efforts to proofread, errors can occur which can change the  meaning. Any change was purely unintentional.    Laban Emperor, PA-C 02/13/19 1337    Lavonia Drafts, MD 02/13/19 1344

## 2019-02-13 NOTE — ED Notes (Signed)
See triage note  Presents from dialysis  States he was exposed to someone that was positive for COVID  But denies any sx's  The center wants him to have a test prior to treatment

## 2019-02-14 ENCOUNTER — Other Ambulatory Visit: Payer: Self-pay

## 2019-02-14 ENCOUNTER — Emergency Department
Admission: EM | Admit: 2019-02-14 | Discharge: 2019-02-14 | Disposition: A | Discharge status: Home / Self Care | Payer: Medicare Other | Attending: Emergency Medicine | Admitting: Emergency Medicine

## 2019-02-14 DIAGNOSIS — E1122 Type 2 diabetes mellitus with diabetic chronic kidney disease: Secondary | ICD-10-CM | POA: Diagnosis not present

## 2019-02-14 DIAGNOSIS — D509 Iron deficiency anemia, unspecified: Secondary | ICD-10-CM | POA: Diagnosis not present

## 2019-02-14 DIAGNOSIS — Z992 Dependence on renal dialysis: Secondary | ICD-10-CM | POA: Diagnosis not present

## 2019-02-14 DIAGNOSIS — I120 Hypertensive chronic kidney disease with stage 5 chronic kidney disease or end stage renal disease: Secondary | ICD-10-CM | POA: Insufficient documentation

## 2019-02-14 DIAGNOSIS — N186 End stage renal disease: Secondary | ICD-10-CM | POA: Insufficient documentation

## 2019-02-14 DIAGNOSIS — Z23 Encounter for immunization: Secondary | ICD-10-CM | POA: Diagnosis not present

## 2019-02-14 DIAGNOSIS — Z87891 Personal history of nicotine dependence: Secondary | ICD-10-CM | POA: Diagnosis not present

## 2019-02-14 DIAGNOSIS — N2581 Secondary hyperparathyroidism of renal origin: Secondary | ICD-10-CM | POA: Diagnosis not present

## 2019-02-14 DIAGNOSIS — R5381 Other malaise: Secondary | ICD-10-CM | POA: Diagnosis not present

## 2019-02-14 DIAGNOSIS — Z79899 Other long term (current) drug therapy: Secondary | ICD-10-CM | POA: Insufficient documentation

## 2019-02-14 DIAGNOSIS — D631 Anemia in chronic kidney disease: Secondary | ICD-10-CM | POA: Diagnosis not present

## 2019-02-14 DIAGNOSIS — Z794 Long term (current) use of insulin: Secondary | ICD-10-CM | POA: Diagnosis not present

## 2019-02-14 DIAGNOSIS — R42 Dizziness and giddiness: Secondary | ICD-10-CM | POA: Insufficient documentation

## 2019-02-14 DIAGNOSIS — U071 COVID-19: Secondary | ICD-10-CM | POA: Diagnosis not present

## 2019-02-14 DIAGNOSIS — R531 Weakness: Secondary | ICD-10-CM | POA: Diagnosis present

## 2019-02-14 LAB — COMPREHENSIVE METABOLIC PANEL
ALT: 18 U/L (ref 0–44)
AST: 23 U/L (ref 15–41)
Albumin: 3.7 g/dL (ref 3.5–5.0)
Alkaline Phosphatase: 152 U/L — ABNORMAL HIGH (ref 38–126)
Anion gap: 13 (ref 5–15)
BUN: 50 mg/dL — ABNORMAL HIGH (ref 6–20)
CO2: 25 mmol/L (ref 22–32)
Calcium: 8 mg/dL — ABNORMAL LOW (ref 8.9–10.3)
Chloride: 99 mmol/L (ref 98–111)
Creatinine, Ser: 8.08 mg/dL — ABNORMAL HIGH (ref 0.61–1.24)
GFR calc Af Amer: 8 mL/min — ABNORMAL LOW (ref >60)
GFR calc non Af Amer: 7 mL/min — ABNORMAL LOW (ref >60)
Glucose, Bld: 89 mg/dL (ref 70–99)
Potassium: 4.3 mmol/L (ref 3.5–5.1)
Sodium: 137 mmol/L (ref 135–145)
Total Bilirubin: 0.6 mg/dL (ref 0.3–1.2)
Total Protein: 7.6 g/dL (ref 6.5–8.1)

## 2019-02-14 LAB — CBC
HCT: 30.1 % — ABNORMAL LOW (ref 39.0–52.0)
Hemoglobin: 9.9 g/dL — ABNORMAL LOW (ref 13.0–17.0)
MCH: 32.5 pg (ref 26.0–34.0)
MCHC: 32.9 g/dL (ref 30.0–36.0)
MCV: 98.7 fL (ref 80.0–100.0)
Platelets: 186 10*3/uL (ref 150–400)
RBC: 3.05 MIL/uL — ABNORMAL LOW (ref 4.22–5.81)
RDW: 14.6 % (ref 11.5–15.5)
WBC: 6 10*3/uL (ref 4.0–10.5)
nRBC: 0 % (ref 0.0–0.2)

## 2019-02-14 MED ORDER — SODIUM CHLORIDE 0.9 % IV BOLUS
250.0000 mL | Freq: Once | INTRAVENOUS | Status: AC
  Administered 2019-02-14: 22:00:00 250 mL via INTRAVENOUS

## 2019-02-14 NOTE — ED Triage Notes (Signed)
Patient came in by [facility] from [place] after having dialysis. Patient started experimenting dizzy ness and weakness. Patient states has been tested multiple times Covid with neg and positive results with latest test being positive Tuesday.

## 2019-02-14 NOTE — ED Provider Notes (Signed)
[facility] Emergency Department Provider Note  Time seen: 10:39 PM  I have reviewed the triage vital signs and the nursing notes.   HISTORY  Chief Complaint Weakness   HPI Jake Gomez is a 48 y.o. male with a past medical history of diabetes, hypertension, ESRD on hemodialysis, presents to the emergency department for dizziness/weakness.  According to the patient he is approximately 3 hours into his dialysis session when he began feeling weak and dizzy.  Patient states this is happened multiple times in the past when he becomes dehydrated.  Patient does state he has tested positive for COVID virus but this was in June and has completed his quarantine and has since tested negative.  Denies any fever cough congestion or shortness of breath.  Denies any chest pain or abdominal pain.   Past Medical History:  Diagnosis Date  . Diabetes mellitus without complication ([place])   . Hypertension   . Renal disorder   . Sleep apnea    can't afford CPAP    Patient Active Problem List   Diagnosis Date Noted  . Sepsis ([place]) 07/11/2018  . SBP (spontaneous bacterial peritonitis) ([place]) 05/25/2018  . Intractable abdominal pain 05/02/2018  . Peritonitis ([place]) 05/02/2018  . Chest pain 05/26/2017  . ESRD on peritoneal dialysis ([place]) 05/26/2017  . Chronic renal disease, stage 4, severely decreased glomerular filtration rate between 15-29 mL/min/1.73 square meter ([place]) 01/30/2014  . Acute-on-chronic renal failure ([place]) 09/29/2013  . Diabetes mellitus ([place]) 09/29/2013  . Hyperkalemia, diminished renal excretion 09/29/2013  . Hypertension 09/29/2013    Past Surgical History:  Procedure Laterality Date  . AV FISTULA PLACEMENT Left 01/31/2019   Procedure: ARTERIOVENOUS (AV) FISTULA CREATION ( BRACHIOCEPHALIC );  Surgeon: Algernon Huxley, MD;  Location: [facility] ORS;  Service: Vascular;  Laterality: Left;  . DIALYSIS/PERMA CATHETER INSERTION N/A 07/19/2018   Procedure: DIALYSIS/PERMA  CATHETER INSERTION;  Surgeon: Algernon Huxley, MD;  Location: [place] [facility];  Service: Cardiovascular;  Laterality: N/A;  . DIALYSIS/PERMA CATHETER INSERTION N/A 09/13/2018   Procedure: DIALYSIS/PERMA CATHETER INSERTION;  Surgeon: Katha Cabal, MD;  Location: [place] [facility];  Service: Cardiovascular;  Laterality: N/A;  . peritonal dialysis    . REMOVAL OF A DIALYSIS CATHETER N/A 07/17/2018   Procedure: REMOVAL OF A DIALYSIS CATHETER;  Surgeon: Algernon Huxley, MD;  Location: [facility] ORS;  Service: Vascular;  Laterality: N/A;    Prior to Admission medications   Medication Sig Start Date End Date Taking? Authorizing Provider  acetaminophen (TYLENOL) 325 MG tablet Take 1,000 mg by mouth every 6 (six) hours as needed for mild pain.    [provider]  amLODipine (NORVASC) 10 MG tablet Take 1 tablet (10 mg total) by mouth daily. Patient taking differently: Take 10 mg by mouth daily. 10mg  at night and 5mg  in the am- nephrology 07/23/18   Otila Back, MD  atorvastatin (LIPITOR) 80 MG tablet Take 1 tablet (80 mg total) by mouth every morning. 10/17/18   Juline Patch, MD  calcium acetate (PHOSLO) 667 MG capsule Take 2,001 mg by mouth 3 (three) times daily with meals.  05/27/17   [provider]  carvedilol (COREG) 12.5 MG tablet Take 1 tablet (12.5 mg total) by mouth 2 (two) times daily with a meal. 10/17/18   Juline Patch, MD  docusate sodium (COLACE) 100 MG capsule Take 1 capsule (100 mg total) by mouth 2 (two) times daily. 05/05/18   Vaughan Basta, MD  folic acid Darnelle Catalan)  1 MG tablet Take 1 tablet (1 mg total) by mouth daily. 05/06/18   Vaughan Basta, MD  gabapentin (NEURONTIN) 300 MG capsule Take 300 mg by mouth daily.    [provider]  glipiZIDE (GLUCOTROL) 5 MG tablet Take 1 tablet (5 mg total) by mouth 2 (two) times daily before a meal. 10/17/18   Juline Patch, MD  hydrALAZINE (APRESOLINE) 25 MG tablet Take 25 mg by mouth 3 (three) times daily.  nephrology 09/28/18   [provider]  HYDROcodone-acetaminophen (NORCO) 5-325 MG tablet Take 1 tablet by mouth every 6 (six) hours as needed for moderate pain. 01/31/19   Algernon Huxley, MD  insulin regular (NOVOLIN R RELION) 100 units/mL injection Inject subcutaneously daily as directed on the following sliding scale: 2 units for blood glucose reading equal to or greater than 150 and 1 unit for each blood glucose reading of 50. 05/27/18   Bettey Costa, MD  Insulin Syringes, Disposable, U-100 1 ML MISC 1 each by Does not apply route daily. 12/29/17   Juline Patch, MD  irbesartan (AVAPRO) 300 MG tablet Take 300 mg by mouth at bedtime.  05/14/17   [provider]  polyethylene glycol (MIRALAX / GLYCOLAX) packet Take 17 g by mouth daily. 05/06/18   Vaughan Basta, MD  RELION INSULIN SYRINGE 1ML/31G 31G X 5/16" 1 ML MISC 1 EACH BY DOSE NOT APPLY ROUTE DAILY 12/29/17   [provider]  torsemide (DEMADEX) 100 MG tablet Take 100 mg by mouth daily.    [provider]  traZODone (DESYREL) 50 MG tablet Take 50 mg by mouth at bedtime. Dr Candiss Norse 01/09/18   [provider]  vitamin B-12 1000 MCG tablet Take 1 tablet (1,000 mcg total) by mouth daily. 05/06/18   Vaughan Basta, MD    No Known Allergies  Family History  Problem Relation Age of Onset  . Thyroid disease Mother   . Stroke Father   . Cancer Father   . Diabetes Father     Social History Social History   Tobacco Use  . Smoking status: Former Smoker    Quit date: 11/14/2009    Years since quitting: 9.2  . Smokeless tobacco: Former Systems developer    Quit date: 07/16/2009  Substance Use Topics  . Alcohol use: No    Frequency: Never  . Drug use: Not Currently    Comment: history 10 years ago   No urine production    Review of Systems Constitutional: Negative for fever. Cardiovascular: Negative for chest pain. Respiratory: Negative for shortness of breath. Gastrointestinal: Negative for  abdominal pain Musculoskeletal: Negative for musculoskeletal complaints Neurological: Negative for headache All other ROS negative  ____________________________________________   PHYSICAL EXAM:  VITAL SIGNS: ED Triage Vitals  Enc Vitals Group     BP 02/14/19 2128 (!) 147/64     Pulse Rate 02/14/19 2128 68     Resp 02/14/19 2128 18     Temp 02/14/19 2128 98.4 F (36.9 C)     Temp Source 02/14/19 2128 Oral     SpO2 02/14/19 2123 95 %     Weight 02/14/19 2133 257 lb 15 oz (117 kg)     Height 02/14/19 2133 5\' 8"  (1.727 m)     Head Circumference --      Peak Flow --      Pain Score 02/14/19 2133 0     Pain Loc --      Pain Edu? --      Excl.  in [place]? --    Constitutional: Alert and oriented. Well appearing and in no distress. Eyes: Normal exam ENT      Head: Normocephalic and atraumatic.      Mouth/Throat: Mucous membranes are moist. Cardiovascular: Normal rate, regular rhythm.  Respiratory: Normal respiratory effort without tachypnea nor retractions. Breath sounds are clear  Gastrointestinal: Soft and nontender. No distention. Musculoskeletal: Nontender with normal range of motion in all extremities Neurologic:  Normal speech and language. No gross focal neurologic deficits  Skin:  Skin is warm, dry and intact.  Psychiatric: Mood and affect are normal.   ____________________________________________    EKG  EKG viewed and interpreted by myself shows a normal sinus rhythm at 67 bpm with a narrow QRS, normal axis, normal intervals, no concerning ST changes.  ____________________________________________   INITIAL IMPRESSION / ASSESSMENT AND PLAN / ED COURSE  Pertinent labs & imaging results that were available during my care of the patient were reviewed by me and considered in my medical decision making (see chart for details).   Patient presents emergency department for weakness/dizziness occurring approximate 3 hours into his dialysis session.  Patient states a history  of the same in the past.  We will check labs, dose 250 cc of IV fluids and continue to closely monitor.  Already patient states he is feeling much better.  Patient states he is feeling back to normal.  Lab work is reassuring.  We will discharge with PCP follow-up.  Patient agreeable to plan of care.  Eh Waver was evaluated in Emergency Department on 02/14/2019 for the symptoms described in the history of present illness. He was evaluated in the context of the global COVID-19 pandemic, which necessitated consideration that the patient might be at risk for infection with the SARS-CoV-2 virus that causes COVID-19. Institutional protocols and algorithms that pertain to the evaluation of patients at risk for COVID-19 are in a state of rapid change based on information released by regulatory bodies including the CDC and federal and state organizations. These policies and algorithms were followed during the patient's care in the ED.  ____________________________________________   FINAL CLINICAL IMPRESSION(S) / ED DIAGNOSES  Dizziness   Harvest Dark, MD 02/14/19 2241

## 2019-02-14 NOTE — ED Notes (Signed)
ED Provider at bedside. 

## 2019-02-15 DIAGNOSIS — N2581 Secondary hyperparathyroidism of renal origin: Secondary | ICD-10-CM | POA: Diagnosis not present

## 2019-02-15 DIAGNOSIS — D631 Anemia in chronic kidney disease: Secondary | ICD-10-CM | POA: Diagnosis not present

## 2019-02-15 DIAGNOSIS — N186 End stage renal disease: Secondary | ICD-10-CM | POA: Diagnosis not present

## 2019-02-15 DIAGNOSIS — Z23 Encounter for immunization: Secondary | ICD-10-CM | POA: Diagnosis not present

## 2019-02-15 DIAGNOSIS — Z992 Dependence on renal dialysis: Secondary | ICD-10-CM | POA: Diagnosis not present

## 2019-02-15 DIAGNOSIS — D509 Iron deficiency anemia, unspecified: Secondary | ICD-10-CM | POA: Diagnosis not present

## 2019-02-17 DIAGNOSIS — Z23 Encounter for immunization: Secondary | ICD-10-CM | POA: Diagnosis not present

## 2019-02-17 DIAGNOSIS — Z992 Dependence on renal dialysis: Secondary | ICD-10-CM | POA: Diagnosis not present

## 2019-02-17 DIAGNOSIS — N2581 Secondary hyperparathyroidism of renal origin: Secondary | ICD-10-CM | POA: Diagnosis not present

## 2019-02-17 DIAGNOSIS — N186 End stage renal disease: Secondary | ICD-10-CM | POA: Diagnosis not present

## 2019-02-17 DIAGNOSIS — D509 Iron deficiency anemia, unspecified: Secondary | ICD-10-CM | POA: Diagnosis not present

## 2019-02-17 DIAGNOSIS — D631 Anemia in chronic kidney disease: Secondary | ICD-10-CM | POA: Diagnosis not present

## 2019-02-20 DIAGNOSIS — N2581 Secondary hyperparathyroidism of renal origin: Secondary | ICD-10-CM | POA: Diagnosis not present

## 2019-02-20 DIAGNOSIS — Z992 Dependence on renal dialysis: Secondary | ICD-10-CM | POA: Diagnosis not present

## 2019-02-20 DIAGNOSIS — N186 End stage renal disease: Secondary | ICD-10-CM | POA: Diagnosis not present

## 2019-02-20 DIAGNOSIS — D509 Iron deficiency anemia, unspecified: Secondary | ICD-10-CM | POA: Diagnosis not present

## 2019-02-20 DIAGNOSIS — Z23 Encounter for immunization: Secondary | ICD-10-CM | POA: Diagnosis not present

## 2019-02-20 DIAGNOSIS — D631 Anemia in chronic kidney disease: Secondary | ICD-10-CM | POA: Diagnosis not present

## 2019-02-22 DIAGNOSIS — Z992 Dependence on renal dialysis: Secondary | ICD-10-CM | POA: Diagnosis not present

## 2019-02-22 DIAGNOSIS — D509 Iron deficiency anemia, unspecified: Secondary | ICD-10-CM | POA: Diagnosis not present

## 2019-02-22 DIAGNOSIS — N2581 Secondary hyperparathyroidism of renal origin: Secondary | ICD-10-CM | POA: Diagnosis not present

## 2019-02-22 DIAGNOSIS — D631 Anemia in chronic kidney disease: Secondary | ICD-10-CM | POA: Diagnosis not present

## 2019-02-22 DIAGNOSIS — N186 End stage renal disease: Secondary | ICD-10-CM | POA: Diagnosis not present

## 2019-02-22 DIAGNOSIS — Z23 Encounter for immunization: Secondary | ICD-10-CM | POA: Diagnosis not present

## 2019-02-24 DIAGNOSIS — D509 Iron deficiency anemia, unspecified: Secondary | ICD-10-CM | POA: Diagnosis not present

## 2019-02-24 DIAGNOSIS — N2581 Secondary hyperparathyroidism of renal origin: Secondary | ICD-10-CM | POA: Diagnosis not present

## 2019-02-24 DIAGNOSIS — Z23 Encounter for immunization: Secondary | ICD-10-CM | POA: Diagnosis not present

## 2019-02-24 DIAGNOSIS — Z992 Dependence on renal dialysis: Secondary | ICD-10-CM | POA: Diagnosis not present

## 2019-02-24 DIAGNOSIS — D631 Anemia in chronic kidney disease: Secondary | ICD-10-CM | POA: Diagnosis not present

## 2019-02-24 DIAGNOSIS — N186 End stage renal disease: Secondary | ICD-10-CM | POA: Diagnosis not present

## 2019-02-26 ENCOUNTER — Ambulatory Visit (INDEPENDENT_AMBULATORY_CARE_PROVIDER_SITE_OTHER): Payer: Medicare Other | Admitting: Nurse Practitioner

## 2019-02-26 ENCOUNTER — Telehealth (INDEPENDENT_AMBULATORY_CARE_PROVIDER_SITE_OTHER): Payer: Self-pay | Admitting: Nurse Practitioner

## 2019-02-26 NOTE — Telephone Encounter (Signed)
Patient called requesting an apt to be seen. Stating he has a hole in his L arm where we did the fistula. Hole just opened today when he pulled off the scab.  Patient had apt today at 1030am but his daughter called and canceled.   Spoke with [facility] and they advised that patient should come in tomorrow to be seen but apply triple antibiotic ointment and band-aid covering in the mean time. Patient is scheduled for 230apt tomorrow with Arna Medici and has been advised of the above. AS, CMA

## 2019-02-27 ENCOUNTER — Encounter (INDEPENDENT_AMBULATORY_CARE_PROVIDER_SITE_OTHER): Payer: Self-pay | Admitting: Nurse Practitioner

## 2019-02-27 ENCOUNTER — Other Ambulatory Visit: Payer: Self-pay

## 2019-02-27 ENCOUNTER — Ambulatory Visit (INDEPENDENT_AMBULATORY_CARE_PROVIDER_SITE_OTHER): Payer: Medicare Other | Admitting: Nurse Practitioner

## 2019-02-27 VITALS — BP 191/104 | HR 77 | Resp 14 | Ht 68.0 in | Wt 253.5 lb

## 2019-02-27 DIAGNOSIS — D509 Iron deficiency anemia, unspecified: Secondary | ICD-10-CM | POA: Diagnosis not present

## 2019-02-27 DIAGNOSIS — Z992 Dependence on renal dialysis: Secondary | ICD-10-CM | POA: Diagnosis not present

## 2019-02-27 DIAGNOSIS — D631 Anemia in chronic kidney disease: Secondary | ICD-10-CM | POA: Diagnosis not present

## 2019-02-27 DIAGNOSIS — E1122 Type 2 diabetes mellitus with diabetic chronic kidney disease: Secondary | ICD-10-CM

## 2019-02-27 DIAGNOSIS — N2581 Secondary hyperparathyroidism of renal origin: Secondary | ICD-10-CM | POA: Diagnosis not present

## 2019-02-27 DIAGNOSIS — I10 Essential (primary) hypertension: Secondary | ICD-10-CM

## 2019-02-27 DIAGNOSIS — N186 End stage renal disease: Secondary | ICD-10-CM | POA: Diagnosis not present

## 2019-02-27 DIAGNOSIS — Z23 Encounter for immunization: Secondary | ICD-10-CM | POA: Diagnosis not present

## 2019-02-27 DIAGNOSIS — Z794 Long term (current) use of insulin: Secondary | ICD-10-CM

## 2019-02-27 MED ORDER — SULFAMETHOXAZOLE-TRIMETHOPRIM 400-80 MG PO TABS: 1.0000 | ORAL_TABLET | Freq: Two times a day (BID) | ORAL | 0 refills | Status: DC

## 2019-02-27 NOTE — Progress Notes (Addendum)
SUBJECTIVE:  Patient ID: Jake Gomez, male    DOB: September 30, 1970, 48 y.o.   MRN: [id number] Chief Complaint  Patient presents with  . Follow-up    HPI  Jake Gomez is a 48 y.o. male that recently underwent left brachiocephalic AV fistula creation on 01/31/2019.  The patient called our office late yesterday afternoon to state that after peeling off 1 of the scabs on his arm he noticed that there was approximately an area that was about 1 inch and he was able to see somewhat into the wound.  Today the patient states that the wound is not nearly as open as it was.  He denies any drainage of the wound.  The patient was given instructions to cover the wound with bacitracin ointment and a bandage which he did.  He denies any fever, chills, nausea, vomiting or diarrhea.  The wound site does not show any signs and symptoms of infection.  Today it appears that just the superficial portion is visible.  He has not yet began to utilize the fistula.  Past Medical History:  Diagnosis Date  . Diabetes mellitus without complication ([place])   . Hypertension   . Renal disorder   . Sleep apnea    can't afford CPAP    Past Surgical History:  Procedure Laterality Date  . AV FISTULA PLACEMENT Left 01/31/2019   Procedure: ARTERIOVENOUS (AV) FISTULA CREATION ( BRACHIOCEPHALIC );  Surgeon: Algernon Huxley, MD;  Location: [facility] ORS;  Service: Vascular;  Laterality: Left;  . DIALYSIS/PERMA CATHETER INSERTION N/A 07/19/2018   Procedure: DIALYSIS/PERMA CATHETER INSERTION;  Surgeon: Algernon Huxley, MD;  Location: [place] [facility];  Service: Cardiovascular;  Laterality: N/A;  . DIALYSIS/PERMA CATHETER INSERTION N/A 09/13/2018   Procedure: DIALYSIS/PERMA CATHETER INSERTION;  Surgeon: Katha Cabal, MD;  Location: [place] [facility];  Service: Cardiovascular;  Laterality: N/A;  . peritonal dialysis    . REMOVAL OF A DIALYSIS CATHETER N/A 07/17/2018   Procedure: REMOVAL OF A DIALYSIS CATHETER;  Surgeon: Algernon Huxley, MD;   Location: [facility];  Service: Vascular;  Laterality: N/A;    Social History   Socioeconomic History  . Marital status: Single    Spouse name: Not on file  . Number of children: Not on file  . Years of education: Not on file  . Highest education level: Not on file  Occupational History  . Not on file  Social Needs  . Financial resource strain: Not on file  . Food insecurity    Worry: Not on file    Inability: Not on file  . Transportation needs    Medical: Not on file    Non-medical: Not on file  Tobacco Use  . Smoking status: Former Smoker    Quit date: 11/14/2009    Years since quitting: 9.2  . Smokeless tobacco: Former Systems developer    Quit date: 07/16/2009  Substance and Sexual Activity  . Alcohol use: No    Frequency: Never  . Drug use: Not Currently    Comment: history 10 years ago   No urine production  . Sexual activity: Not on file  Lifestyle  . Physical activity    Days per week: Not on file    Minutes per session: Not on file  . Stress: Not on file  Relationships  . Social Herbalist on phone: Not on file    Gets together: Not on file    Attends religious service: Not on file  Active member of club or organization: Not on file    Attends meetings of clubs or organizations: Not on file    Relationship status: Not on file  . Intimate partner violence    Fear of current or ex partner: Not on file    Emotionally abused: Not on file    Physically abused: Not on file    Forced sexual activity: Not on file  Other Topics Concern  . Not on file  Social History Narrative  . Not on file    Family History  Problem Relation Age of Onset  . Thyroid disease Mother   . Stroke Father   . Cancer Father   . Diabetes Father     No Known Allergies   Review of Systems   Review of Systems: Negative Unless Checked Constitutional: [] Weight loss  [] Fever  [] Chills Cardiac: [] Chest pain   []  Atrial Fibrillation  [] Palpitations   [] Shortness of breath when laying  flat   [] Shortness of breath with exertion. [] Shortness of breath at rest Vascular:  [] Pain in legs with walking   [] Pain in legs with standing [] Pain in legs when laying flat   [] Claudication    [] Pain in feet when laying flat    [] History of DVT   [] Phlebitis   [] Swelling in legs   [] Varicose veins   [] Non-healing ulcers Pulmonary:   [] Uses home oxygen   [] Productive cough   [] Hemoptysis   [] Wheeze  [] COPD   [] Asthma Neurologic:  [] Dizziness   [] Seizures  [] Blackouts [] History of stroke   [] History of TIA  [] Aphasia   [] Temporary Blindness   [] Weakness or numbness in arm   [] Weakness or numbness in leg Musculoskeletal:   [] Joint swelling   [] Joint pain   [] Low back pain  []  History of Knee Replacement [] Arthritis [] back Surgeries  []  Spinal Stenosis    Hematologic:  [] Easy bruising  [] Easy bleeding   [] Hypercoagulable state   [x] Anemic Gastrointestinal:  [] Diarrhea   [] Vomiting  [] Gastroesophageal reflux/heartburn   [] Difficulty swallowing. [] Abdominal pain Genitourinary:  [x] Chronic kidney disease   [] Difficult urination  [] Anuric   [] Blood in urine [] Frequent urination  [] Burning with urination   [] Hematuria Skin:  [] Rashes   [] Ulcers [x] Wounds Psychological:  [] History of anxiety   []  History of major depression  []  Memory Difficulties      OBJECTIVE:   Physical Exam  BP (!) 191/104 (BP Location: Right Wrist, Patient Position: Sitting, Cuff Size: Normal)   Pulse 77   Resp 14   Ht 5\' 8"  (1.727 m)   Wt 253 lb 8.5 oz (115 kg)   BMI 38.55 kg/m   Gen: WD/WN, NAD Head: [place]/AT, No temporalis wasting.  Ear/Nose/Throat: Hearing grossly intact, nares w/o erythema or drainage Eyes: PER, EOMI, sclera nonicteric.  Neck: Supple, no masses.  No JVD.  Pulmonary:  Good air movement, no use of accessory muscles.  Cardiac: RRR Vascular:  Good thrill and bruit.  Wound at medial end of incision on left arm Vessel Right Left  Radial Palpable Palpable   Gastrointestinal: soft, non-distended. No  guarding/no peritoneal signs.  Musculoskeletal: M/S 5/5 throughout.  No deformity or atrophy.  Neurologic: Pain and light touch intact in extremities.  Symmetrical.  Speech is fluent. Motor exam as listed above. Psychiatric: Judgment intact, Mood & affect appropriate for pt's clinical situation. Dermatologic: No Venous rashes. No Ulcers Noted.  No changes consistent with cellulitis. Lymph : No Cervical lymphadenopathy, no lichenification or skin changes of chronic lymphedema.  ASSESSMENT AND PLAN:  1. ESRD (end stage renal disease) ([place]) We will have the patient return to the office in the next 2 to 3 weeks to evaluate status of the wound.  The wound was cleaned and disinfected today with Steri-Strips placed over the wound.  We will also do a prophylactic dose of Bactrim in order to hopefully stave off any infection.  Patient is advised of signs symptoms of infection including pain swelling and redness at the site, foul-smelling odor, purulent drainage.  Patient will contact us if he exhibits any of these signs and symptoms. - sulfamethoxazole-trimethoprim (BACTRIM) 400-80 MG tablet; Take 1 tablet by mouth 2 (two) times daily.  Dispense: 14 tablet; Refill: 0  2. Type 2 diabetes mellitus with chronic kidney disease on chronic dialysis, with long-term current use of insulin (HCC) Continue hypoglycemic medications as already ordered, these medications have been reviewed and there are no changes at this time.  Hgb A1C to be monitored as already arranged by primary service   3. Essential hypertension Continue antihypertensive medications as already ordered, these medications have been reviewed and there are no changes at this time.  Patient's blood pressure however is elevated today.  Patient states that he has missed a dose of his medication at this time.  Advised patient that missing doses could cause detrimental rises to his blood pressure.  Patient continue to take medication as ordered.     Current Outpatient Medications on File Prior to Visit  Medication Sig Dispense Refill  . acetaminophen (TYLENOL) 325 MG tablet Take 1,000 mg by mouth every 6 (six) hours as needed for mild pain.    Marland Kitchen amLODipine (NORVASC) 10 MG tablet Take 1 tablet (10 mg total) by mouth daily. (Patient taking differently: Take 10 mg by mouth daily. 10mg  at night and 5mg  in the am- nephrology) 30 tablet 0  . atorvastatin (LIPITOR) 80 MG tablet Take 1 tablet (80 mg total) by mouth every morning. 30 tablet 5  . calcium acetate (PHOSLO) 667 MG capsule Take 2,001 mg by mouth 3 (three) times daily with meals.     . carvedilol (COREG) 12.5 MG tablet Take 1 tablet (12.5 mg total) by mouth 2 (two) times daily with a meal. 60 tablet 5  . folic acid (FOLVITE) 1 MG tablet Take 1 tablet (1 mg total) by mouth daily. 30 tablet 0  . gabapentin (NEURONTIN) 300 MG capsule Take 300 mg by mouth daily.    Marland Kitchen glipiZIDE (GLUCOTROL) 5 MG tablet Take 1 tablet (5 mg total) by mouth 2 (two) times daily before a meal. 60 tablet 5  . hydrALAZINE (APRESOLINE) 25 MG tablet Take 25 mg by mouth 3 (three) times daily. nephrology    . irbesartan (AVAPRO) 300 MG tablet Take 300 mg by mouth at bedtime.     . torsemide (DEMADEX) 100 MG tablet Take 100 mg by mouth daily.    . vitamin B-12 1000 MCG tablet Take 1 tablet (1,000 mcg total) by mouth daily. 30 tablet 0  . Insulin Syringes, Disposable, U-100 1 ML MISC 1 each by Does not apply route daily. 100 each 0  . RELION INSULIN SYRINGE 1ML/31G 31G X 5/16" 1 ML MISC 1 EACH BY DOSE NOT APPLY ROUTE DAILY  0   Current Facility-Administered Medications on File Prior to Visit  Medication Dose Route Frequency Provider Last Rate Last Dose  . alteplase (ACTIVASE) 5 mg in sodium chloride 0.9 % 50 mL (0.1 mg/mL) infusion  5 mg Intravenous Once [employer], [employer]  A, PA-C      . alteplase (ACTIVASE) 5 mg in sodium chloride 0.9 % 50 mL (0.1 mg/mL) infusion  5 mg Intravenous Once Stegmayer, Kimberly A, PA-C       . alteplase (ACTIVASE) 5 mg in sodium chloride 0.9 % 50 mL (0.1 mg/mL) infusion  5 mg Intravenous Once Eulogio Ditch E, NP      . alteplase (ACTIVASE) 5 mg in sodium chloride 0.9 % 50 mL (0.1 mg/mL) infusion  5 mg Intravenous Once Eulogio Ditch E, NP      . alteplase (ACTIVASE) 5 mg in sodium chloride 0.9 % 50 mL (0.1 mg/mL) infusion  5 mg Intravenous Once Eulogio Ditch E, NP      . alteplase (ACTIVASE) 5 mg in sodium chloride 0.9 % 50 mL (0.1 mg/mL) infusion  5 mg Intravenous Once Kris Hartmann, NP        There are no Patient Instructions on file for this visit. No follow-ups on file.   Kris Hartmann, NP  This note was completed with Sales executive.  Any errors are purely unintentional.

## 2019-03-01 DIAGNOSIS — N2581 Secondary hyperparathyroidism of renal origin: Secondary | ICD-10-CM | POA: Diagnosis not present

## 2019-03-01 DIAGNOSIS — Z992 Dependence on renal dialysis: Secondary | ICD-10-CM | POA: Diagnosis not present

## 2019-03-01 DIAGNOSIS — Z23 Encounter for immunization: Secondary | ICD-10-CM | POA: Diagnosis not present

## 2019-03-01 DIAGNOSIS — D509 Iron deficiency anemia, unspecified: Secondary | ICD-10-CM | POA: Diagnosis not present

## 2019-03-01 DIAGNOSIS — D631 Anemia in chronic kidney disease: Secondary | ICD-10-CM | POA: Diagnosis not present

## 2019-03-01 DIAGNOSIS — N186 End stage renal disease: Secondary | ICD-10-CM | POA: Diagnosis not present

## 2019-03-03 DIAGNOSIS — D631 Anemia in chronic kidney disease: Secondary | ICD-10-CM | POA: Diagnosis not present

## 2019-03-03 DIAGNOSIS — N186 End stage renal disease: Secondary | ICD-10-CM | POA: Diagnosis not present

## 2019-03-03 DIAGNOSIS — D509 Iron deficiency anemia, unspecified: Secondary | ICD-10-CM | POA: Diagnosis not present

## 2019-03-03 DIAGNOSIS — N2581 Secondary hyperparathyroidism of renal origin: Secondary | ICD-10-CM | POA: Diagnosis not present

## 2019-03-03 DIAGNOSIS — Z992 Dependence on renal dialysis: Secondary | ICD-10-CM | POA: Diagnosis not present

## 2019-03-03 DIAGNOSIS — Z23 Encounter for immunization: Secondary | ICD-10-CM | POA: Diagnosis not present

## 2019-03-06 DIAGNOSIS — Z23 Encounter for immunization: Secondary | ICD-10-CM | POA: Diagnosis not present

## 2019-03-06 DIAGNOSIS — N2581 Secondary hyperparathyroidism of renal origin: Secondary | ICD-10-CM | POA: Diagnosis not present

## 2019-03-06 DIAGNOSIS — D509 Iron deficiency anemia, unspecified: Secondary | ICD-10-CM | POA: Diagnosis not present

## 2019-03-06 DIAGNOSIS — N186 End stage renal disease: Secondary | ICD-10-CM | POA: Diagnosis not present

## 2019-03-06 DIAGNOSIS — D631 Anemia in chronic kidney disease: Secondary | ICD-10-CM | POA: Diagnosis not present

## 2019-03-06 DIAGNOSIS — Z992 Dependence on renal dialysis: Secondary | ICD-10-CM | POA: Diagnosis not present

## 2019-03-09 DIAGNOSIS — N186 End stage renal disease: Secondary | ICD-10-CM | POA: Diagnosis not present

## 2019-03-09 DIAGNOSIS — Z992 Dependence on renal dialysis: Secondary | ICD-10-CM | POA: Diagnosis not present

## 2019-03-10 DIAGNOSIS — N2581 Secondary hyperparathyroidism of renal origin: Secondary | ICD-10-CM | POA: Diagnosis not present

## 2019-03-10 DIAGNOSIS — D631 Anemia in chronic kidney disease: Secondary | ICD-10-CM | POA: Diagnosis not present

## 2019-03-10 DIAGNOSIS — N186 End stage renal disease: Secondary | ICD-10-CM | POA: Diagnosis not present

## 2019-03-10 DIAGNOSIS — D509 Iron deficiency anemia, unspecified: Secondary | ICD-10-CM | POA: Diagnosis not present

## 2019-03-10 DIAGNOSIS — Z23 Encounter for immunization: Secondary | ICD-10-CM | POA: Diagnosis not present

## 2019-03-10 DIAGNOSIS — Z992 Dependence on renal dialysis: Secondary | ICD-10-CM | POA: Diagnosis not present

## 2019-03-13 DIAGNOSIS — N2581 Secondary hyperparathyroidism of renal origin: Secondary | ICD-10-CM | POA: Diagnosis not present

## 2019-03-13 DIAGNOSIS — Z23 Encounter for immunization: Secondary | ICD-10-CM | POA: Diagnosis not present

## 2019-03-13 DIAGNOSIS — Z992 Dependence on renal dialysis: Secondary | ICD-10-CM | POA: Diagnosis not present

## 2019-03-13 DIAGNOSIS — D509 Iron deficiency anemia, unspecified: Secondary | ICD-10-CM | POA: Diagnosis not present

## 2019-03-13 DIAGNOSIS — D631 Anemia in chronic kidney disease: Secondary | ICD-10-CM | POA: Diagnosis not present

## 2019-03-13 DIAGNOSIS — N186 End stage renal disease: Secondary | ICD-10-CM | POA: Diagnosis not present

## 2019-03-14 DIAGNOSIS — N186 End stage renal disease: Secondary | ICD-10-CM | POA: Diagnosis not present

## 2019-03-14 DIAGNOSIS — Z992 Dependence on renal dialysis: Secondary | ICD-10-CM | POA: Diagnosis not present

## 2019-03-14 DIAGNOSIS — E8779 Other fluid overload: Secondary | ICD-10-CM | POA: Diagnosis not present

## 2019-03-15 ENCOUNTER — Ambulatory Visit (INDEPENDENT_AMBULATORY_CARE_PROVIDER_SITE_OTHER): Payer: Medicare Other | Admitting: Nurse Practitioner

## 2019-03-15 ENCOUNTER — Encounter (INDEPENDENT_AMBULATORY_CARE_PROVIDER_SITE_OTHER): Payer: Medicare Other

## 2019-03-16 DIAGNOSIS — N186 End stage renal disease: Secondary | ICD-10-CM | POA: Diagnosis not present

## 2019-03-16 DIAGNOSIS — D509 Iron deficiency anemia, unspecified: Secondary | ICD-10-CM | POA: Diagnosis not present

## 2019-03-16 DIAGNOSIS — D631 Anemia in chronic kidney disease: Secondary | ICD-10-CM | POA: Diagnosis not present

## 2019-03-16 DIAGNOSIS — Z992 Dependence on renal dialysis: Secondary | ICD-10-CM | POA: Diagnosis not present

## 2019-03-16 DIAGNOSIS — Z23 Encounter for immunization: Secondary | ICD-10-CM | POA: Diagnosis not present

## 2019-03-17 DIAGNOSIS — D509 Iron deficiency anemia, unspecified: Secondary | ICD-10-CM | POA: Diagnosis not present

## 2019-03-17 DIAGNOSIS — Z23 Encounter for immunization: Secondary | ICD-10-CM | POA: Diagnosis not present

## 2019-03-17 DIAGNOSIS — Z992 Dependence on renal dialysis: Secondary | ICD-10-CM | POA: Diagnosis not present

## 2019-03-17 DIAGNOSIS — N186 End stage renal disease: Secondary | ICD-10-CM | POA: Diagnosis not present

## 2019-03-17 DIAGNOSIS — D631 Anemia in chronic kidney disease: Secondary | ICD-10-CM | POA: Diagnosis not present

## 2019-03-20 DIAGNOSIS — Z23 Encounter for immunization: Secondary | ICD-10-CM | POA: Diagnosis not present

## 2019-03-20 DIAGNOSIS — D509 Iron deficiency anemia, unspecified: Secondary | ICD-10-CM | POA: Diagnosis not present

## 2019-03-20 DIAGNOSIS — Z992 Dependence on renal dialysis: Secondary | ICD-10-CM | POA: Diagnosis not present

## 2019-03-20 DIAGNOSIS — N186 End stage renal disease: Secondary | ICD-10-CM | POA: Diagnosis not present

## 2019-03-20 DIAGNOSIS — D631 Anemia in chronic kidney disease: Secondary | ICD-10-CM | POA: Diagnosis not present

## 2019-03-21 ENCOUNTER — Other Ambulatory Visit (INDEPENDENT_AMBULATORY_CARE_PROVIDER_SITE_OTHER): Payer: Self-pay | Admitting: Nurse Practitioner

## 2019-03-21 ENCOUNTER — Ambulatory Visit (INDEPENDENT_AMBULATORY_CARE_PROVIDER_SITE_OTHER): Payer: Medicare Other | Admitting: Nurse Practitioner

## 2019-03-21 ENCOUNTER — Encounter (INDEPENDENT_AMBULATORY_CARE_PROVIDER_SITE_OTHER): Payer: Medicare Other

## 2019-03-21 DIAGNOSIS — N186 End stage renal disease: Secondary | ICD-10-CM

## 2019-03-22 DIAGNOSIS — N186 End stage renal disease: Secondary | ICD-10-CM | POA: Diagnosis not present

## 2019-03-22 DIAGNOSIS — D631 Anemia in chronic kidney disease: Secondary | ICD-10-CM | POA: Diagnosis not present

## 2019-03-22 DIAGNOSIS — D509 Iron deficiency anemia, unspecified: Secondary | ICD-10-CM | POA: Diagnosis not present

## 2019-03-22 DIAGNOSIS — Z992 Dependence on renal dialysis: Secondary | ICD-10-CM | POA: Diagnosis not present

## 2019-03-22 DIAGNOSIS — Z23 Encounter for immunization: Secondary | ICD-10-CM | POA: Diagnosis not present

## 2019-03-24 DIAGNOSIS — D509 Iron deficiency anemia, unspecified: Secondary | ICD-10-CM | POA: Diagnosis not present

## 2019-03-24 DIAGNOSIS — N186 End stage renal disease: Secondary | ICD-10-CM | POA: Diagnosis not present

## 2019-03-24 DIAGNOSIS — D631 Anemia in chronic kidney disease: Secondary | ICD-10-CM | POA: Diagnosis not present

## 2019-03-24 DIAGNOSIS — Z992 Dependence on renal dialysis: Secondary | ICD-10-CM | POA: Diagnosis not present

## 2019-03-24 DIAGNOSIS — Z23 Encounter for immunization: Secondary | ICD-10-CM | POA: Diagnosis not present

## 2019-03-28 DIAGNOSIS — N186 End stage renal disease: Secondary | ICD-10-CM | POA: Diagnosis not present

## 2019-03-28 DIAGNOSIS — Z992 Dependence on renal dialysis: Secondary | ICD-10-CM | POA: Diagnosis not present

## 2019-03-28 DIAGNOSIS — D631 Anemia in chronic kidney disease: Secondary | ICD-10-CM | POA: Diagnosis not present

## 2019-03-28 DIAGNOSIS — Z23 Encounter for immunization: Secondary | ICD-10-CM | POA: Diagnosis not present

## 2019-03-28 DIAGNOSIS — D509 Iron deficiency anemia, unspecified: Secondary | ICD-10-CM | POA: Diagnosis not present

## 2019-03-30 DIAGNOSIS — E669 Obesity, unspecified: Secondary | ICD-10-CM | POA: Diagnosis not present

## 2019-03-30 DIAGNOSIS — E8779 Other fluid overload: Secondary | ICD-10-CM | POA: Diagnosis not present

## 2019-03-30 DIAGNOSIS — Z992 Dependence on renal dialysis: Secondary | ICD-10-CM | POA: Diagnosis not present

## 2019-03-30 DIAGNOSIS — N2581 Secondary hyperparathyroidism of renal origin: Secondary | ICD-10-CM | POA: Diagnosis not present

## 2019-03-30 DIAGNOSIS — D631 Anemia in chronic kidney disease: Secondary | ICD-10-CM | POA: Diagnosis not present

## 2019-03-30 DIAGNOSIS — N186 End stage renal disease: Secondary | ICD-10-CM | POA: Diagnosis not present

## 2019-03-30 DIAGNOSIS — D509 Iron deficiency anemia, unspecified: Secondary | ICD-10-CM | POA: Diagnosis not present

## 2019-03-31 DIAGNOSIS — N186 End stage renal disease: Secondary | ICD-10-CM | POA: Diagnosis not present

## 2019-03-31 DIAGNOSIS — Z992 Dependence on renal dialysis: Secondary | ICD-10-CM | POA: Diagnosis not present

## 2019-03-31 DIAGNOSIS — D631 Anemia in chronic kidney disease: Secondary | ICD-10-CM | POA: Diagnosis not present

## 2019-03-31 DIAGNOSIS — D509 Iron deficiency anemia, unspecified: Secondary | ICD-10-CM | POA: Diagnosis not present

## 2019-03-31 DIAGNOSIS — Z23 Encounter for immunization: Secondary | ICD-10-CM | POA: Diagnosis not present

## 2019-04-03 DIAGNOSIS — D631 Anemia in chronic kidney disease: Secondary | ICD-10-CM | POA: Diagnosis not present

## 2019-04-03 DIAGNOSIS — Z992 Dependence on renal dialysis: Secondary | ICD-10-CM | POA: Diagnosis not present

## 2019-04-03 DIAGNOSIS — D509 Iron deficiency anemia, unspecified: Secondary | ICD-10-CM | POA: Diagnosis not present

## 2019-04-03 DIAGNOSIS — N186 End stage renal disease: Secondary | ICD-10-CM | POA: Diagnosis not present

## 2019-04-03 DIAGNOSIS — Z23 Encounter for immunization: Secondary | ICD-10-CM | POA: Diagnosis not present

## 2019-04-04 DIAGNOSIS — D509 Iron deficiency anemia, unspecified: Secondary | ICD-10-CM | POA: Diagnosis not present

## 2019-04-04 DIAGNOSIS — N186 End stage renal disease: Secondary | ICD-10-CM | POA: Diagnosis not present

## 2019-04-04 DIAGNOSIS — D631 Anemia in chronic kidney disease: Secondary | ICD-10-CM | POA: Diagnosis not present

## 2019-04-04 DIAGNOSIS — Z992 Dependence on renal dialysis: Secondary | ICD-10-CM | POA: Diagnosis not present

## 2019-04-04 DIAGNOSIS — N2581 Secondary hyperparathyroidism of renal origin: Secondary | ICD-10-CM | POA: Diagnosis not present

## 2019-04-04 DIAGNOSIS — E8779 Other fluid overload: Secondary | ICD-10-CM | POA: Diagnosis not present

## 2019-04-06 DIAGNOSIS — N186 End stage renal disease: Secondary | ICD-10-CM | POA: Diagnosis not present

## 2019-04-06 DIAGNOSIS — N2581 Secondary hyperparathyroidism of renal origin: Secondary | ICD-10-CM | POA: Diagnosis not present

## 2019-04-06 DIAGNOSIS — Z992 Dependence on renal dialysis: Secondary | ICD-10-CM | POA: Diagnosis not present

## 2019-04-06 DIAGNOSIS — D631 Anemia in chronic kidney disease: Secondary | ICD-10-CM | POA: Diagnosis not present

## 2019-04-06 DIAGNOSIS — E8779 Other fluid overload: Secondary | ICD-10-CM | POA: Diagnosis not present

## 2019-04-06 DIAGNOSIS — D509 Iron deficiency anemia, unspecified: Secondary | ICD-10-CM | POA: Diagnosis not present

## 2019-04-09 DIAGNOSIS — D631 Anemia in chronic kidney disease: Secondary | ICD-10-CM | POA: Diagnosis not present

## 2019-04-09 DIAGNOSIS — D509 Iron deficiency anemia, unspecified: Secondary | ICD-10-CM | POA: Diagnosis not present

## 2019-04-09 DIAGNOSIS — E8779 Other fluid overload: Secondary | ICD-10-CM | POA: Diagnosis not present

## 2019-04-09 DIAGNOSIS — N2581 Secondary hyperparathyroidism of renal origin: Secondary | ICD-10-CM | POA: Diagnosis not present

## 2019-04-09 DIAGNOSIS — N186 End stage renal disease: Secondary | ICD-10-CM | POA: Diagnosis not present

## 2019-04-09 DIAGNOSIS — Z992 Dependence on renal dialysis: Secondary | ICD-10-CM | POA: Diagnosis not present

## 2019-04-11 ENCOUNTER — Ambulatory Visit (INDEPENDENT_AMBULATORY_CARE_PROVIDER_SITE_OTHER): Payer: Medicare Other | Admitting: Nurse Practitioner

## 2019-04-11 ENCOUNTER — Encounter (INDEPENDENT_AMBULATORY_CARE_PROVIDER_SITE_OTHER): Payer: Self-pay | Admitting: Nurse Practitioner

## 2019-04-11 ENCOUNTER — Ambulatory Visit (INDEPENDENT_AMBULATORY_CARE_PROVIDER_SITE_OTHER): Payer: Medicare Other

## 2019-04-11 ENCOUNTER — Other Ambulatory Visit: Payer: Self-pay

## 2019-04-11 VITALS — BP 175/71 | HR 83 | Resp 16 | Wt 266.2 lb

## 2019-04-11 DIAGNOSIS — E1122 Type 2 diabetes mellitus with diabetic chronic kidney disease: Secondary | ICD-10-CM

## 2019-04-11 DIAGNOSIS — D631 Anemia in chronic kidney disease: Secondary | ICD-10-CM | POA: Diagnosis not present

## 2019-04-11 DIAGNOSIS — N186 End stage renal disease: Secondary | ICD-10-CM

## 2019-04-11 DIAGNOSIS — D509 Iron deficiency anemia, unspecified: Secondary | ICD-10-CM | POA: Diagnosis not present

## 2019-04-11 DIAGNOSIS — E8779 Other fluid overload: Secondary | ICD-10-CM | POA: Diagnosis not present

## 2019-04-11 DIAGNOSIS — N2581 Secondary hyperparathyroidism of renal origin: Secondary | ICD-10-CM | POA: Diagnosis not present

## 2019-04-11 DIAGNOSIS — Z992 Dependence on renal dialysis: Secondary | ICD-10-CM

## 2019-04-11 DIAGNOSIS — I10 Essential (primary) hypertension: Secondary | ICD-10-CM

## 2019-04-11 DIAGNOSIS — Z794 Long term (current) use of insulin: Secondary | ICD-10-CM

## 2019-04-13 DIAGNOSIS — D631 Anemia in chronic kidney disease: Secondary | ICD-10-CM | POA: Diagnosis not present

## 2019-04-13 DIAGNOSIS — E8779 Other fluid overload: Secondary | ICD-10-CM | POA: Diagnosis not present

## 2019-04-13 DIAGNOSIS — D509 Iron deficiency anemia, unspecified: Secondary | ICD-10-CM | POA: Diagnosis not present

## 2019-04-13 DIAGNOSIS — N186 End stage renal disease: Secondary | ICD-10-CM | POA: Diagnosis not present

## 2019-04-13 DIAGNOSIS — Z992 Dependence on renal dialysis: Secondary | ICD-10-CM | POA: Diagnosis not present

## 2019-04-13 DIAGNOSIS — N2581 Secondary hyperparathyroidism of renal origin: Secondary | ICD-10-CM | POA: Diagnosis not present

## 2019-04-13 DIAGNOSIS — E119 Type 2 diabetes mellitus without complications: Secondary | ICD-10-CM | POA: Diagnosis not present

## 2019-04-14 DIAGNOSIS — Z992 Dependence on renal dialysis: Secondary | ICD-10-CM | POA: Diagnosis not present

## 2019-04-14 DIAGNOSIS — N186 End stage renal disease: Secondary | ICD-10-CM | POA: Diagnosis not present

## 2019-04-16 ENCOUNTER — Encounter (INDEPENDENT_AMBULATORY_CARE_PROVIDER_SITE_OTHER): Payer: Self-pay | Admitting: Nurse Practitioner

## 2019-04-16 DIAGNOSIS — D631 Anemia in chronic kidney disease: Secondary | ICD-10-CM | POA: Diagnosis not present

## 2019-04-16 DIAGNOSIS — N186 End stage renal disease: Secondary | ICD-10-CM | POA: Diagnosis not present

## 2019-04-16 DIAGNOSIS — E669 Obesity, unspecified: Secondary | ICD-10-CM | POA: Diagnosis not present

## 2019-04-16 DIAGNOSIS — N2581 Secondary hyperparathyroidism of renal origin: Secondary | ICD-10-CM | POA: Diagnosis not present

## 2019-04-16 DIAGNOSIS — E8779 Other fluid overload: Secondary | ICD-10-CM | POA: Diagnosis not present

## 2019-04-16 DIAGNOSIS — D509 Iron deficiency anemia, unspecified: Secondary | ICD-10-CM | POA: Diagnosis not present

## 2019-04-16 DIAGNOSIS — Z992 Dependence on renal dialysis: Secondary | ICD-10-CM | POA: Diagnosis not present

## 2019-04-16 NOTE — Progress Notes (Signed)
SUBJECTIVE:  Patient ID: Jake Gomez, male    DOB: 08-12-1970, 48 y.o.   MRN: [id number] Chief Complaint  Patient presents with  . Follow-up    ultrasound follow up    HPI  Jake Gomez is a 48 y.o. male that presents today for evaluation of his left brachiocephalic AV fistula.  At previous visit the patient had some slight dehiscence of the wound.  Today that wound is completely healed.  The patient has brachiocephalic AV fistula placed on 01/31/2019.  The patient is currently maintained via PermCath and he denies any issues with that currently.  He denies any fever, chills, nausea, vomiting or diarrhea.  Today the patient underwent a HDA which revealed a flow volume of 1537.  There is a significant change in velocities from the confluence to the shoulder however the AV fistula appears to be patent throughout with no evidence of stenosis or narrowing seen throughout.  Past Medical History:  Diagnosis Date  . Diabetes mellitus without complication ([place])   . Hypertension   . Renal disorder   . Sleep apnea    can't afford CPAP    Past Surgical History:  Procedure Laterality Date  . AV FISTULA PLACEMENT Left 01/31/2019   Procedure: ARTERIOVENOUS (AV) FISTULA CREATION ( BRACHIOCEPHALIC );  Surgeon: Algernon Huxley, MD;  Location: [facility];  Service: Vascular;  Laterality: Left;  . DIALYSIS/PERMA CATHETER INSERTION N/A 07/19/2018   Procedure: DIALYSIS/PERMA CATHETER INSERTION;  Surgeon: Algernon Huxley, MD;  Location: [facility];  Service: Cardiovascular;  Laterality: N/A;  . DIALYSIS/PERMA CATHETER INSERTION N/A 09/13/2018   Procedure: DIALYSIS/PERMA CATHETER INSERTION;  Surgeon: Katha Cabal, MD;  Location: [facility];  Service: Cardiovascular;  Laterality: N/A;  . peritonal dialysis    . REMOVAL OF A DIALYSIS CATHETER N/A 07/17/2018   Procedure: REMOVAL OF A DIALYSIS CATHETER;  Surgeon: Algernon Huxley, MD;  Location: [facility];  Service: Vascular;  Laterality: N/A;     Social History   Socioeconomic History  . Marital status: Single    Spouse name: Not on file  . Number of children: Not on file  . Years of education: Not on file  . Highest education level: Not on file  Occupational History  . Not on file  Social Needs  . Financial resource strain: Not on file  . Food insecurity    Worry: Not on file    Inability: Not on file  . Transportation needs    Medical: Not on file    Non-medical: Not on file  Tobacco Use  . Smoking status: Former Smoker    Quit date: 11/14/2009    Years since quitting: 9.4  . Smokeless tobacco: Former Systems developer    Quit date: 07/16/2009  Substance and Sexual Activity  . Alcohol use: No    Frequency: Never  . Drug use: Not Currently    Comment: history 10 years ago   No urine production  . Sexual activity: Not on file  Lifestyle  . Physical activity    Days per week: Not on file    Minutes per session: Not on file  . Stress: Not on file  Relationships  . Social Herbalist on phone: Not on file    Gets together: Not on file    Attends religious service: Not on file    Active member of club or organization: Not on file    Attends meetings of clubs or organizations: Not on file  Relationship status: Not on file  . Intimate partner violence    Fear of current or ex partner: Not on file    Emotionally abused: Not on file    Physically abused: Not on file    Forced sexual activity: Not on file  Other Topics Concern  . Not on file  Social History Narrative  . Not on file    Family History  Problem Relation Age of Onset  . Thyroid disease Mother   . Stroke Father   . Cancer Father   . Diabetes Father     No Known Allergies   Review of Systems   Review of Systems: Negative Unless Checked Constitutional: [] Weight loss  [] Fever  [] Chills Cardiac: [] Chest pain   []  Atrial Fibrillation  [] Palpitations   [] Shortness of breath when laying flat   [] Shortness of breath with exertion. [] Shortness of  breath at rest Vascular:  [] Pain in legs with walking   [] Pain in legs with standing [] Pain in legs when laying flat   [] Claudication    [] Pain in feet when laying flat    [] History of DVT   [] Phlebitis   [] Swelling in legs   [] Varicose veins   [] Non-healing ulcers Pulmonary:   [] Uses home oxygen   [] Productive cough   [] Hemoptysis   [] Wheeze  [] COPD   [] Asthma Neurologic:  [] Dizziness   [] Seizures  [] Blackouts [] History of stroke   [] History of TIA  [] Aphasia   [] Temporary Blindness   [] Weakness or numbness in arm   [] Weakness or numbness in leg Musculoskeletal:   [] Joint swelling   [] Joint pain   [] Low back pain  []  History of Knee Replacement [] Arthritis [] back Surgeries  []  Spinal Stenosis    Hematologic:  [] Easy bruising  [] Easy bleeding   [] Hypercoagulable state   [x] Anemic Gastrointestinal:  [] Diarrhea   [] Vomiting  [] Gastroesophageal reflux/heartburn   [] Difficulty swallowing. [] Abdominal pain Genitourinary:  [x] Chronic kidney disease   [] Difficult urination  [] Anuric   [] Blood in urine [] Frequent urination  [] Burning with urination   [] Hematuria Skin:  [] Rashes   [] Ulcers [] Wounds Psychological:  [] History of anxiety   []  History of major depression  []  Memory Difficulties      OBJECTIVE:   Physical Exam  BP (!) 175/71 (BP Location: Right Arm)   Pulse 83   Resp 16   Wt 266 lb 3.2 oz (120.7 kg)   BMI 40.48 kg/m   Gen: WD/WN, NAD Head: [place]/AT, No temporalis wasting.  Ear/Nose/Throat: Hearing grossly intact, nares w/o erythema or drainage Eyes: PER, EOMI, sclera nonicteric.  Neck: Supple, no masses.  No JVD.  Pulmonary:  Good air movement, no use of accessory muscles.  Cardiac: RRR Vascular:  Good thrill and bruit, previous wound completely healed Vessel Right Left  Radial Palpable Palpable   Gastrointestinal: soft, non-distended. No guarding/no peritoneal signs.  Musculoskeletal: M/S 5/5 throughout.  No deformity or atrophy.  Neurologic: Pain and light touch intact in  extremities.  Symmetrical.  Speech is fluent. Motor exam as listed above. Psychiatric: Judgment intact, Mood & affect appropriate for pt's clinical situation. Dermatologic: No Venous rashes. No Ulcers Noted.  No changes consistent with cellulitis. Lymph : No Cervical lymphadenopathy, no lichenification or skin changes of chronic lymphedema.       ASSESSMENT AND PLAN:  1. End stage renal disease ([place]) Recommend:  The patient currently has adequate dialysis access. The patient's dialysis center is not reporting any access issues.  Noninvasive studies today show that the patient's fistula has matured for  use.  Patient was given a letter to give to dialysis center stating that he can begin cannulation of his fistula.  This letter was also faxed to the dialysis center as well.  Barring any issues the patient will follow-up in 6 months with an HDA.  2. Essential hypertension Continue antihypertensive medications as already ordered, these medications have been reviewed and there are no changes at this time.   3. Type 2 diabetes mellitus with chronic kidney disease on chronic dialysis, with long-term current use of insulin (HCC) Continue hypoglycemic medications as already ordered, these medications have been reviewed and there are no changes at this time.  Hgb A1C to be monitored as already arranged by primary service    Current Outpatient Medications on File Prior to Visit  Medication Sig Dispense Refill  . acetaminophen (TYLENOL) 325 MG tablet Take 1,000 mg by mouth every 6 (six) hours as needed for mild pain.    Marland Kitchen amLODipine (NORVASC) 10 MG tablet Take 1 tablet (10 mg total) by mouth daily. (Patient taking differently: Take 10 mg by mouth daily. 10mg  at night and 5mg  in the am- nephrology) 30 tablet 0  . atorvastatin (LIPITOR) 80 MG tablet Take 1 tablet (80 mg total) by mouth every morning. 30 tablet 5  . calcium acetate (PHOSLO) 667 MG capsule Take 2,001 mg by mouth 3 (three) times  daily with meals.     . carvedilol (COREG) 12.5 MG tablet Take 1 tablet (12.5 mg total) by mouth 2 (two) times daily with a meal. 60 tablet 5  . folic acid (FOLVITE) 1 MG tablet Take 1 tablet (1 mg total) by mouth daily. 30 tablet 0  . gabapentin (NEURONTIN) 300 MG capsule Take 300 mg by mouth daily.    Marland Kitchen glipiZIDE (GLUCOTROL) 5 MG tablet Take 1 tablet (5 mg total) by mouth 2 (two) times daily before a meal. 60 tablet 5  . hydrALAZINE (APRESOLINE) 25 MG tablet Take 25 mg by mouth 3 (three) times daily. nephrology    . Insulin Syringes, Disposable, U-100 1 ML MISC 1 each by Does not apply route daily. 100 each 0  . irbesartan (AVAPRO) 300 MG tablet Take 300 mg by mouth at bedtime.     Marland Kitchen RELION INSULIN SYRINGE 1ML/31G 31G X 5/16" 1 ML MISC 1 EACH BY DOSE NOT APPLY ROUTE DAILY  0  . sulfamethoxazole-trimethoprim (BACTRIM) 400-80 MG tablet Take 1 tablet by mouth 2 (two) times daily. 14 tablet 0  . torsemide (DEMADEX) 100 MG tablet Take 100 mg by mouth daily.    . vitamin B-12 1000 MCG tablet Take 1 tablet (1,000 mcg total) by mouth daily. 30 tablet 0   Current Facility-Administered Medications on File Prior to Visit  Medication Dose Route Frequency Provider Last Rate Last Dose  . alteplase (ACTIVASE) 5 mg in sodium chloride 0.9 % 50 mL (0.1 mg/mL) infusion  5 mg Intravenous Once Stegmayer, Kimberly A, PA-C      . alteplase (ACTIVASE) 5 mg in sodium chloride 0.9 % 50 mL (0.1 mg/mL) infusion  5 mg Intravenous Once Stegmayer, Kimberly A, PA-C      . alteplase (ACTIVASE) 5 mg in sodium chloride 0.9 % 50 mL (0.1 mg/mL) infusion  5 mg Intravenous Once Eulogio Ditch E, NP      . alteplase (ACTIVASE) 5 mg in sodium chloride 0.9 % 50 mL (0.1 mg/mL) infusion  5 mg Intravenous Once Eulogio Ditch E, NP      . alteplase (ACTIVASE) 5 mg in  sodium chloride 0.9 % 50 mL (0.1 mg/mL) infusion  5 mg Intravenous Once Eulogio Ditch E, NP      . alteplase (ACTIVASE) 5 mg in sodium chloride 0.9 % 50 mL (0.1 mg/mL) infusion   5 mg Intravenous Once Kris Hartmann, NP        There are no Patient Instructions on file for this visit. No follow-ups on file.   Kris Hartmann, NP  This note was completed with Sales executive.  Any errors are purely unintentional.

## 2019-04-18 DIAGNOSIS — D509 Iron deficiency anemia, unspecified: Secondary | ICD-10-CM | POA: Diagnosis not present

## 2019-04-18 DIAGNOSIS — E8779 Other fluid overload: Secondary | ICD-10-CM | POA: Diagnosis not present

## 2019-04-18 DIAGNOSIS — Z992 Dependence on renal dialysis: Secondary | ICD-10-CM | POA: Diagnosis not present

## 2019-04-18 DIAGNOSIS — N2581 Secondary hyperparathyroidism of renal origin: Secondary | ICD-10-CM | POA: Diagnosis not present

## 2019-04-18 DIAGNOSIS — D631 Anemia in chronic kidney disease: Secondary | ICD-10-CM | POA: Diagnosis not present

## 2019-04-18 DIAGNOSIS — N186 End stage renal disease: Secondary | ICD-10-CM | POA: Diagnosis not present

## 2019-04-20 DIAGNOSIS — N2581 Secondary hyperparathyroidism of renal origin: Secondary | ICD-10-CM | POA: Diagnosis not present

## 2019-04-20 DIAGNOSIS — E8779 Other fluid overload: Secondary | ICD-10-CM | POA: Diagnosis not present

## 2019-04-20 DIAGNOSIS — N186 End stage renal disease: Secondary | ICD-10-CM | POA: Diagnosis not present

## 2019-04-20 DIAGNOSIS — D631 Anemia in chronic kidney disease: Secondary | ICD-10-CM | POA: Diagnosis not present

## 2019-04-20 DIAGNOSIS — Z992 Dependence on renal dialysis: Secondary | ICD-10-CM | POA: Diagnosis not present

## 2019-04-20 DIAGNOSIS — D509 Iron deficiency anemia, unspecified: Secondary | ICD-10-CM | POA: Diagnosis not present

## 2019-04-23 DIAGNOSIS — N186 End stage renal disease: Secondary | ICD-10-CM | POA: Diagnosis not present

## 2019-04-23 DIAGNOSIS — E8779 Other fluid overload: Secondary | ICD-10-CM | POA: Diagnosis not present

## 2019-04-23 DIAGNOSIS — N2581 Secondary hyperparathyroidism of renal origin: Secondary | ICD-10-CM | POA: Diagnosis not present

## 2019-04-23 DIAGNOSIS — D509 Iron deficiency anemia, unspecified: Secondary | ICD-10-CM | POA: Diagnosis not present

## 2019-04-23 DIAGNOSIS — D631 Anemia in chronic kidney disease: Secondary | ICD-10-CM | POA: Diagnosis not present

## 2019-04-23 DIAGNOSIS — Z992 Dependence on renal dialysis: Secondary | ICD-10-CM | POA: Diagnosis not present

## 2019-04-25 DIAGNOSIS — N186 End stage renal disease: Secondary | ICD-10-CM | POA: Diagnosis not present

## 2019-04-25 DIAGNOSIS — E8779 Other fluid overload: Secondary | ICD-10-CM | POA: Diagnosis not present

## 2019-04-25 DIAGNOSIS — D509 Iron deficiency anemia, unspecified: Secondary | ICD-10-CM | POA: Diagnosis not present

## 2019-04-25 DIAGNOSIS — N2581 Secondary hyperparathyroidism of renal origin: Secondary | ICD-10-CM | POA: Diagnosis not present

## 2019-04-25 DIAGNOSIS — D631 Anemia in chronic kidney disease: Secondary | ICD-10-CM | POA: Diagnosis not present

## 2019-04-25 DIAGNOSIS — Z992 Dependence on renal dialysis: Secondary | ICD-10-CM | POA: Diagnosis not present

## 2019-04-27 DIAGNOSIS — D631 Anemia in chronic kidney disease: Secondary | ICD-10-CM | POA: Diagnosis not present

## 2019-04-27 DIAGNOSIS — D509 Iron deficiency anemia, unspecified: Secondary | ICD-10-CM | POA: Diagnosis not present

## 2019-04-27 DIAGNOSIS — Z992 Dependence on renal dialysis: Secondary | ICD-10-CM | POA: Diagnosis not present

## 2019-04-27 DIAGNOSIS — N186 End stage renal disease: Secondary | ICD-10-CM | POA: Diagnosis not present

## 2019-04-27 DIAGNOSIS — E8779 Other fluid overload: Secondary | ICD-10-CM | POA: Diagnosis not present

## 2019-04-27 DIAGNOSIS — N2581 Secondary hyperparathyroidism of renal origin: Secondary | ICD-10-CM | POA: Diagnosis not present

## 2019-04-30 DIAGNOSIS — D631 Anemia in chronic kidney disease: Secondary | ICD-10-CM | POA: Diagnosis not present

## 2019-04-30 DIAGNOSIS — N2581 Secondary hyperparathyroidism of renal origin: Secondary | ICD-10-CM | POA: Diagnosis not present

## 2019-04-30 DIAGNOSIS — N186 End stage renal disease: Secondary | ICD-10-CM | POA: Diagnosis not present

## 2019-04-30 DIAGNOSIS — D509 Iron deficiency anemia, unspecified: Secondary | ICD-10-CM | POA: Diagnosis not present

## 2019-04-30 DIAGNOSIS — E8779 Other fluid overload: Secondary | ICD-10-CM | POA: Diagnosis not present

## 2019-04-30 DIAGNOSIS — Z992 Dependence on renal dialysis: Secondary | ICD-10-CM | POA: Diagnosis not present

## 2019-05-02 DIAGNOSIS — N186 End stage renal disease: Secondary | ICD-10-CM | POA: Diagnosis not present

## 2019-05-02 DIAGNOSIS — E8779 Other fluid overload: Secondary | ICD-10-CM | POA: Diagnosis not present

## 2019-05-02 DIAGNOSIS — D509 Iron deficiency anemia, unspecified: Secondary | ICD-10-CM | POA: Diagnosis not present

## 2019-05-02 DIAGNOSIS — N2581 Secondary hyperparathyroidism of renal origin: Secondary | ICD-10-CM | POA: Diagnosis not present

## 2019-05-02 DIAGNOSIS — Z992 Dependence on renal dialysis: Secondary | ICD-10-CM | POA: Diagnosis not present

## 2019-05-02 DIAGNOSIS — D631 Anemia in chronic kidney disease: Secondary | ICD-10-CM | POA: Diagnosis not present

## 2019-05-04 DIAGNOSIS — N2581 Secondary hyperparathyroidism of renal origin: Secondary | ICD-10-CM | POA: Diagnosis not present

## 2019-05-04 DIAGNOSIS — E8779 Other fluid overload: Secondary | ICD-10-CM | POA: Diagnosis not present

## 2019-05-04 DIAGNOSIS — D631 Anemia in chronic kidney disease: Secondary | ICD-10-CM | POA: Diagnosis not present

## 2019-05-04 DIAGNOSIS — N186 End stage renal disease: Secondary | ICD-10-CM | POA: Diagnosis not present

## 2019-05-04 DIAGNOSIS — Z992 Dependence on renal dialysis: Secondary | ICD-10-CM | POA: Diagnosis not present

## 2019-05-04 DIAGNOSIS — D509 Iron deficiency anemia, unspecified: Secondary | ICD-10-CM | POA: Diagnosis not present

## 2019-05-06 DIAGNOSIS — D631 Anemia in chronic kidney disease: Secondary | ICD-10-CM | POA: Diagnosis not present

## 2019-05-06 DIAGNOSIS — N186 End stage renal disease: Secondary | ICD-10-CM | POA: Diagnosis not present

## 2019-05-06 DIAGNOSIS — D509 Iron deficiency anemia, unspecified: Secondary | ICD-10-CM | POA: Diagnosis not present

## 2019-05-06 DIAGNOSIS — E8779 Other fluid overload: Secondary | ICD-10-CM | POA: Diagnosis not present

## 2019-05-06 DIAGNOSIS — Z992 Dependence on renal dialysis: Secondary | ICD-10-CM | POA: Diagnosis not present

## 2019-05-06 DIAGNOSIS — N2581 Secondary hyperparathyroidism of renal origin: Secondary | ICD-10-CM | POA: Diagnosis not present

## 2019-05-08 ENCOUNTER — Encounter (INDEPENDENT_AMBULATORY_CARE_PROVIDER_SITE_OTHER): Payer: Medicare Other

## 2019-05-08 ENCOUNTER — Ambulatory Visit (INDEPENDENT_AMBULATORY_CARE_PROVIDER_SITE_OTHER): Payer: Medicare Other | Admitting: Nurse Practitioner

## 2019-05-08 DIAGNOSIS — E8779 Other fluid overload: Secondary | ICD-10-CM | POA: Diagnosis not present

## 2019-05-08 DIAGNOSIS — D631 Anemia in chronic kidney disease: Secondary | ICD-10-CM | POA: Diagnosis not present

## 2019-05-08 DIAGNOSIS — D509 Iron deficiency anemia, unspecified: Secondary | ICD-10-CM | POA: Diagnosis not present

## 2019-05-08 DIAGNOSIS — N2581 Secondary hyperparathyroidism of renal origin: Secondary | ICD-10-CM | POA: Diagnosis not present

## 2019-05-08 DIAGNOSIS — Z992 Dependence on renal dialysis: Secondary | ICD-10-CM | POA: Diagnosis not present

## 2019-05-08 DIAGNOSIS — N186 End stage renal disease: Secondary | ICD-10-CM | POA: Diagnosis not present

## 2019-05-09 DIAGNOSIS — D509 Iron deficiency anemia, unspecified: Secondary | ICD-10-CM | POA: Diagnosis not present

## 2019-05-09 DIAGNOSIS — N2581 Secondary hyperparathyroidism of renal origin: Secondary | ICD-10-CM | POA: Diagnosis not present

## 2019-05-09 DIAGNOSIS — Z992 Dependence on renal dialysis: Secondary | ICD-10-CM | POA: Diagnosis not present

## 2019-05-09 DIAGNOSIS — D631 Anemia in chronic kidney disease: Secondary | ICD-10-CM | POA: Diagnosis not present

## 2019-05-09 DIAGNOSIS — N186 End stage renal disease: Secondary | ICD-10-CM | POA: Diagnosis not present

## 2019-05-09 DIAGNOSIS — E8779 Other fluid overload: Secondary | ICD-10-CM | POA: Diagnosis not present

## 2019-05-12 DIAGNOSIS — Z992 Dependence on renal dialysis: Secondary | ICD-10-CM | POA: Diagnosis not present

## 2019-05-12 DIAGNOSIS — N186 End stage renal disease: Secondary | ICD-10-CM | POA: Diagnosis not present

## 2019-05-12 DIAGNOSIS — N2581 Secondary hyperparathyroidism of renal origin: Secondary | ICD-10-CM | POA: Diagnosis not present

## 2019-05-12 DIAGNOSIS — E8779 Other fluid overload: Secondary | ICD-10-CM | POA: Diagnosis not present

## 2019-05-12 DIAGNOSIS — D631 Anemia in chronic kidney disease: Secondary | ICD-10-CM | POA: Diagnosis not present

## 2019-05-12 DIAGNOSIS — D509 Iron deficiency anemia, unspecified: Secondary | ICD-10-CM | POA: Diagnosis not present

## 2019-05-14 DIAGNOSIS — E8779 Other fluid overload: Secondary | ICD-10-CM | POA: Diagnosis not present

## 2019-05-14 DIAGNOSIS — D631 Anemia in chronic kidney disease: Secondary | ICD-10-CM | POA: Diagnosis not present

## 2019-05-14 DIAGNOSIS — N186 End stage renal disease: Secondary | ICD-10-CM | POA: Diagnosis not present

## 2019-05-14 DIAGNOSIS — D509 Iron deficiency anemia, unspecified: Secondary | ICD-10-CM | POA: Diagnosis not present

## 2019-05-14 DIAGNOSIS — N2581 Secondary hyperparathyroidism of renal origin: Secondary | ICD-10-CM | POA: Diagnosis not present

## 2019-05-14 DIAGNOSIS — Z992 Dependence on renal dialysis: Secondary | ICD-10-CM | POA: Diagnosis not present

## 2019-05-21 ENCOUNTER — Telehealth (INDEPENDENT_AMBULATORY_CARE_PROVIDER_SITE_OTHER): Payer: Self-pay

## 2019-05-21 NOTE — Telephone Encounter (Signed)
A fax was received from [place] at [facility] for the patient to have a permcath removal. Patient has been scheduled for a permcath removal with Dr. Lucky Cowboy on 05/28/2019 with a 12:00 pm arrival time to the [facility]. Patient will do covid testing on 05/24/2019 between 12:30-2:30 pm at the [facility]. Pre-procedure instructions will be faxed to Shanon Payor attention [place].

## 2019-05-24 ENCOUNTER — Other Ambulatory Visit: Admission: RE | Admit: 2019-05-24 | Payer: Medicare Other | Source: Ambulatory Visit

## 2019-05-28 ENCOUNTER — Other Ambulatory Visit (INDEPENDENT_AMBULATORY_CARE_PROVIDER_SITE_OTHER): Payer: Self-pay | Admitting: Nurse Practitioner

## 2019-05-31 ENCOUNTER — Ambulatory Visit: Admission: RE | Admit: 2019-05-31 | Payer: Medicare Other | Source: Home / Self Care | Admitting: Vascular Surgery

## 2019-05-31 ENCOUNTER — Encounter: Admission: RE | Payer: Self-pay | Source: Home / Self Care

## 2019-05-31 DIAGNOSIS — N186 End stage renal disease: Secondary | ICD-10-CM

## 2019-05-31 SURGERY — DIALYSIS/PERMA CATHETER REMOVAL
Anesthesia: LOCAL

## 2019-06-01 DIAGNOSIS — R402364 Coma scale, best motor response, obeys commands, 24 hours or more after hospital admission: Secondary | ICD-10-CM | POA: Diagnosis not present

## 2019-06-01 DIAGNOSIS — N2581 Secondary hyperparathyroidism of renal origin: Secondary | ICD-10-CM | POA: Diagnosis present

## 2019-06-01 DIAGNOSIS — Z79899 Other long term (current) drug therapy: Secondary | ICD-10-CM

## 2019-06-01 DIAGNOSIS — G9341 Metabolic encephalopathy: Secondary | ICD-10-CM | POA: Diagnosis present

## 2019-06-01 DIAGNOSIS — Z515 Encounter for palliative care: Secondary | ICD-10-CM | POA: Diagnosis present

## 2019-06-01 DIAGNOSIS — Z87891 Personal history of nicotine dependence: Secondary | ICD-10-CM

## 2019-06-01 DIAGNOSIS — E876 Hypokalemia: Secondary | ICD-10-CM | POA: Diagnosis not present

## 2019-06-01 DIAGNOSIS — J9601 Acute respiratory failure with hypoxia: Secondary | ICD-10-CM | POA: Diagnosis not present

## 2019-06-01 DIAGNOSIS — I6340 Cerebral infarction due to embolism of unspecified cerebral artery: Secondary | ICD-10-CM | POA: Diagnosis not present

## 2019-06-01 DIAGNOSIS — N186 End stage renal disease: Secondary | ICD-10-CM | POA: Diagnosis present

## 2019-06-01 DIAGNOSIS — E785 Hyperlipidemia, unspecified: Secondary | ICD-10-CM | POA: Diagnosis present

## 2019-06-01 DIAGNOSIS — D631 Anemia in chronic kidney disease: Secondary | ICD-10-CM | POA: Diagnosis present

## 2019-06-01 DIAGNOSIS — E875 Hyperkalemia: Secondary | ICD-10-CM | POA: Diagnosis present

## 2019-06-01 DIAGNOSIS — T829XXA Unspecified complication of cardiac and vascular prosthetic device, implant and graft, initial encounter: Secondary | ICD-10-CM | POA: Diagnosis not present

## 2019-06-01 DIAGNOSIS — Z66 Do not resuscitate: Secondary | ICD-10-CM | POA: Diagnosis present

## 2019-06-01 DIAGNOSIS — A4189 Other specified sepsis: Principal | ICD-10-CM | POA: Diagnosis present

## 2019-06-01 DIAGNOSIS — Z6841 Body Mass Index (BMI) 40.0 and over, adult: Secondary | ICD-10-CM

## 2019-06-01 DIAGNOSIS — Z794 Long term (current) use of insulin: Secondary | ICD-10-CM

## 2019-06-01 DIAGNOSIS — R61 Generalized hyperhidrosis: Secondary | ICD-10-CM | POA: Diagnosis not present

## 2019-06-01 DIAGNOSIS — U071 COVID-19: Secondary | ICD-10-CM | POA: Diagnosis present

## 2019-06-01 DIAGNOSIS — D696 Thrombocytopenia, unspecified: Secondary | ICD-10-CM | POA: Diagnosis present

## 2019-06-01 DIAGNOSIS — G9349 Other encephalopathy: Secondary | ICD-10-CM | POA: Diagnosis not present

## 2019-06-01 DIAGNOSIS — R6521 Severe sepsis with septic shock: Secondary | ICD-10-CM | POA: Diagnosis present

## 2019-06-01 DIAGNOSIS — R29709 NIHSS score 9: Secondary | ICD-10-CM | POA: Diagnosis not present

## 2019-06-01 DIAGNOSIS — R570 Cardiogenic shock: Secondary | ICD-10-CM | POA: Diagnosis present

## 2019-06-01 DIAGNOSIS — R402144 Coma scale, eyes open, spontaneous, 24 hours or more after hospital admission: Secondary | ICD-10-CM | POA: Diagnosis not present

## 2019-06-01 DIAGNOSIS — M899 Disorder of bone, unspecified: Secondary | ICD-10-CM | POA: Diagnosis present

## 2019-06-01 DIAGNOSIS — Z992 Dependence on renal dialysis: Secondary | ICD-10-CM

## 2019-06-01 DIAGNOSIS — E1022 Type 1 diabetes mellitus with diabetic chronic kidney disease: Secondary | ICD-10-CM | POA: Diagnosis present

## 2019-06-01 DIAGNOSIS — J9602 Acute respiratory failure with hypercapnia: Secondary | ICD-10-CM | POA: Diagnosis not present

## 2019-06-01 DIAGNOSIS — R0989 Other specified symptoms and signs involving the circulatory and respiratory systems: Secondary | ICD-10-CM | POA: Diagnosis not present

## 2019-06-01 DIAGNOSIS — I120 Hypertensive chronic kidney disease with stage 5 chronic kidney disease or end stage renal disease: Secondary | ICD-10-CM | POA: Diagnosis not present

## 2019-06-01 DIAGNOSIS — R402254 Coma scale, best verbal response, oriented, 24 hours or more after hospital admission: Secondary | ICD-10-CM | POA: Diagnosis not present

## 2019-06-01 DIAGNOSIS — Z8619 Personal history of other infectious and parasitic diseases: Secondary | ICD-10-CM

## 2019-06-01 DIAGNOSIS — G629 Polyneuropathy, unspecified: Secondary | ICD-10-CM | POA: Diagnosis present

## 2019-06-01 DIAGNOSIS — Z833 Family history of diabetes mellitus: Secondary | ICD-10-CM

## 2019-06-01 DIAGNOSIS — G4733 Obstructive sleep apnea (adult) (pediatric): Secondary | ICD-10-CM | POA: Diagnosis present

## 2019-06-01 DIAGNOSIS — J189 Pneumonia, unspecified organism: Secondary | ICD-10-CM | POA: Diagnosis not present

## 2019-06-01 DIAGNOSIS — Z0184 Encounter for antibody response examination: Secondary | ICD-10-CM

## 2019-06-02 ENCOUNTER — Emergency Department: Payer: Medicare Other

## 2019-06-02 ENCOUNTER — Inpatient Hospital Stay
Admission: EM | Admit: 2019-06-02 | Discharge: 2019-07-16 | DRG: 853 | Disposition: E | Payer: Medicare Other | Attending: Internal Medicine | Admitting: Internal Medicine

## 2019-06-02 ENCOUNTER — Other Ambulatory Visit: Payer: Self-pay

## 2019-06-02 DIAGNOSIS — I10 Essential (primary) hypertension: Secondary | ICD-10-CM | POA: Diagnosis not present

## 2019-06-02 DIAGNOSIS — E875 Hyperkalemia: Secondary | ICD-10-CM | POA: Diagnosis present

## 2019-06-02 DIAGNOSIS — J9600 Acute respiratory failure, unspecified whether with hypoxia or hypercapnia: Secondary | ICD-10-CM

## 2019-06-02 DIAGNOSIS — I639 Cerebral infarction, unspecified: Secondary | ICD-10-CM | POA: Diagnosis present

## 2019-06-02 DIAGNOSIS — Z01818 Encounter for other preprocedural examination: Secondary | ICD-10-CM | POA: Diagnosis not present

## 2019-06-02 DIAGNOSIS — I361 Nonrheumatic tricuspid (valve) insufficiency: Secondary | ICD-10-CM | POA: Diagnosis not present

## 2019-06-02 DIAGNOSIS — Z978 Presence of other specified devices: Secondary | ICD-10-CM

## 2019-06-02 DIAGNOSIS — J984 Other disorders of lung: Secondary | ICD-10-CM

## 2019-06-02 DIAGNOSIS — I959 Hypotension, unspecified: Secondary | ICD-10-CM | POA: Diagnosis not present

## 2019-06-02 DIAGNOSIS — J9690 Respiratory failure, unspecified, unspecified whether with hypoxia or hypercapnia: Secondary | ICD-10-CM | POA: Diagnosis not present

## 2019-06-02 DIAGNOSIS — Z4659 Encounter for fitting and adjustment of other gastrointestinal appliance and device: Secondary | ICD-10-CM

## 2019-06-02 DIAGNOSIS — Z789 Other specified health status: Secondary | ICD-10-CM

## 2019-06-02 DIAGNOSIS — I6340 Cerebral infarction due to embolism of unspecified cerebral artery: Secondary | ICD-10-CM | POA: Diagnosis not present

## 2019-06-02 DIAGNOSIS — N186 End stage renal disease: Secondary | ICD-10-CM

## 2019-06-02 DIAGNOSIS — R079 Chest pain, unspecified: Secondary | ICD-10-CM

## 2019-06-02 DIAGNOSIS — R4182 Altered mental status, unspecified: Secondary | ICD-10-CM | POA: Diagnosis not present

## 2019-06-02 DIAGNOSIS — D696 Thrombocytopenia, unspecified: Secondary | ICD-10-CM | POA: Diagnosis present

## 2019-06-02 DIAGNOSIS — E1122 Type 2 diabetes mellitus with diabetic chronic kidney disease: Secondary | ICD-10-CM

## 2019-06-02 DIAGNOSIS — E785 Hyperlipidemia, unspecified: Secondary | ICD-10-CM | POA: Diagnosis present

## 2019-06-02 DIAGNOSIS — J9601 Acute respiratory failure with hypoxia: Secondary | ICD-10-CM | POA: Diagnosis not present

## 2019-06-02 DIAGNOSIS — Z0189 Encounter for other specified special examinations: Secondary | ICD-10-CM | POA: Diagnosis not present

## 2019-06-02 DIAGNOSIS — J189 Pneumonia, unspecified organism: Secondary | ICD-10-CM | POA: Diagnosis present

## 2019-06-02 DIAGNOSIS — E876 Hypokalemia: Secondary | ICD-10-CM | POA: Diagnosis not present

## 2019-06-02 DIAGNOSIS — Z992 Dependence on renal dialysis: Secondary | ICD-10-CM | POA: Diagnosis not present

## 2019-06-02 DIAGNOSIS — L8990 Pressure ulcer of unspecified site, unspecified stage: Secondary | ICD-10-CM | POA: Insufficient documentation

## 2019-06-02 DIAGNOSIS — I6359 Cerebral infarction due to unspecified occlusion or stenosis of other cerebral artery: Secondary | ICD-10-CM | POA: Diagnosis not present

## 2019-06-02 DIAGNOSIS — R6521 Severe sepsis with septic shock: Secondary | ICD-10-CM | POA: Diagnosis present

## 2019-06-02 DIAGNOSIS — G9341 Metabolic encephalopathy: Secondary | ICD-10-CM | POA: Diagnosis present

## 2019-06-02 DIAGNOSIS — T829XXA Unspecified complication of cardiac and vascular prosthetic device, implant and graft, initial encounter: Secondary | ICD-10-CM | POA: Diagnosis not present

## 2019-06-02 DIAGNOSIS — M899 Disorder of bone, unspecified: Secondary | ICD-10-CM | POA: Diagnosis present

## 2019-06-02 DIAGNOSIS — I63423 Cerebral infarction due to embolism of bilateral anterior cerebral arteries: Secondary | ICD-10-CM | POA: Diagnosis not present

## 2019-06-02 DIAGNOSIS — H5589 Other irregular eye movements: Secondary | ICD-10-CM | POA: Diagnosis not present

## 2019-06-02 DIAGNOSIS — J9602 Acute respiratory failure with hypercapnia: Secondary | ICD-10-CM | POA: Diagnosis not present

## 2019-06-02 DIAGNOSIS — Z6841 Body Mass Index (BMI) 40.0 and over, adult: Secondary | ICD-10-CM | POA: Diagnosis not present

## 2019-06-02 DIAGNOSIS — A4189 Other specified sepsis: Secondary | ICD-10-CM | POA: Diagnosis present

## 2019-06-02 DIAGNOSIS — Z66 Do not resuscitate: Secondary | ICD-10-CM | POA: Diagnosis present

## 2019-06-02 DIAGNOSIS — I340 Nonrheumatic mitral (valve) insufficiency: Secondary | ICD-10-CM | POA: Diagnosis not present

## 2019-06-02 DIAGNOSIS — Z515 Encounter for palliative care: Secondary | ICD-10-CM | POA: Diagnosis present

## 2019-06-02 DIAGNOSIS — U071 COVID-19: Secondary | ICD-10-CM | POA: Diagnosis not present

## 2019-06-02 DIAGNOSIS — N2581 Secondary hyperparathyroidism of renal origin: Secondary | ICD-10-CM | POA: Diagnosis present

## 2019-06-02 DIAGNOSIS — T82898A Other specified complication of vascular prosthetic devices, implants and grafts, initial encounter: Secondary | ICD-10-CM | POA: Diagnosis not present

## 2019-06-02 DIAGNOSIS — R570 Cardiogenic shock: Secondary | ICD-10-CM | POA: Diagnosis present

## 2019-06-02 DIAGNOSIS — D631 Anemia in chronic kidney disease: Secondary | ICD-10-CM | POA: Diagnosis present

## 2019-06-02 DIAGNOSIS — E1022 Type 1 diabetes mellitus with diabetic chronic kidney disease: Secondary | ICD-10-CM | POA: Diagnosis present

## 2019-06-02 DIAGNOSIS — G9349 Other encephalopathy: Secondary | ICD-10-CM | POA: Diagnosis not present

## 2019-06-02 DIAGNOSIS — R0989 Other specified symptoms and signs involving the circulatory and respiratory systems: Secondary | ICD-10-CM | POA: Diagnosis not present

## 2019-06-02 DIAGNOSIS — G629 Polyneuropathy, unspecified: Secondary | ICD-10-CM | POA: Diagnosis present

## 2019-06-02 DIAGNOSIS — J181 Lobar pneumonia, unspecified organism: Secondary | ICD-10-CM | POA: Diagnosis not present

## 2019-06-02 LAB — TROPONIN I (HIGH SENSITIVITY)
Troponin I (High Sensitivity): 11 ng/L (ref <18)
Troponin I (High Sensitivity): 11 ng/L (ref <18)
Troponin I (High Sensitivity): 60 ng/L — ABNORMAL HIGH (ref <18)

## 2019-06-02 LAB — BASIC METABOLIC PANEL
Anion gap: 21 — ABNORMAL HIGH (ref 5–15)
BUN: 123 mg/dL — ABNORMAL HIGH (ref 6–20)
CO2: 21 mmol/L — ABNORMAL LOW (ref 22–32)
Calcium: 7.2 mg/dL — ABNORMAL LOW (ref 8.9–10.3)
Chloride: 101 mmol/L (ref 98–111)
Creatinine, Ser: 15.46 mg/dL — ABNORMAL HIGH (ref 0.61–1.24)
GFR calc Af Amer: 4 mL/min — ABNORMAL LOW (ref >60)
GFR calc non Af Amer: 3 mL/min — ABNORMAL LOW (ref >60)
Glucose, Bld: 227 mg/dL — ABNORMAL HIGH (ref 70–99)
Potassium: 6.4 mmol/L (ref 3.5–5.1)
Sodium: 143 mmol/L (ref 135–145)

## 2019-06-02 LAB — CBC WITH DIFFERENTIAL/PLATELET
Abs Immature Granulocytes: 0.11 10*3/uL — ABNORMAL HIGH (ref 0.00–0.07)
Abs Immature Granulocytes: 0.29 10*3/uL — ABNORMAL HIGH (ref 0.00–0.07)
Basophils Absolute: 0 10*3/uL (ref 0.0–0.1)
Basophils Absolute: 0 10*3/uL (ref 0.0–0.1)
Basophils Relative: 0 %
Basophils Relative: 1 %
Eosinophils Absolute: 0.3 10*3/uL (ref 0.0–0.5)
Eosinophils Absolute: 0.1 10*3/uL (ref 0.0–0.5)
Eosinophils Relative: 5 %
Eosinophils Relative: 3 %
HCT: 29.3 % — ABNORMAL LOW (ref 39.0–52.0)
HCT: 26.8 % — ABNORMAL LOW (ref 39.0–52.0)
Hemoglobin: 9.3 g/dL — ABNORMAL LOW (ref 13.0–17.0)
Hemoglobin: 8.6 g/dL — ABNORMAL LOW (ref 13.0–17.0)
Immature Granulocytes: 2 %
Immature Granulocytes: 17 %
Lymphocytes Relative: 17 %
Lymphocytes Relative: 4 %
Lymphs Abs: 1.3 10*3/uL (ref 0.7–4.0)
Lymphs Abs: 0.1 10*3/uL — ABNORMAL LOW (ref 0.7–4.0)
MCH: 32.5 pg (ref 26.0–34.0)
MCH: 32.6 pg (ref 26.0–34.0)
MCHC: 31.7 g/dL (ref 30.0–36.0)
MCHC: 32.1 g/dL (ref 30.0–36.0)
MCV: 102.4 fL — ABNORMAL HIGH (ref 80.0–100.0)
MCV: 101.5 fL — ABNORMAL HIGH (ref 80.0–100.0)
Monocytes Absolute: 0.9 10*3/uL (ref 0.1–1.0)
Monocytes Absolute: 0.1 10*3/uL (ref 0.1–1.0)
Monocytes Relative: 12 %
Monocytes Relative: 3 %
Neutro Abs: 4.9 10*3/uL (ref 1.7–7.7)
Neutro Abs: 1.3 10*3/uL — ABNORMAL LOW (ref 1.7–7.7)
Neutrophils Relative %: 64 %
Neutrophils Relative %: 72 %
Platelets: 118 10*3/uL — ABNORMAL LOW (ref 150–400)
Platelets: 66 10*3/uL — ABNORMAL LOW (ref 150–400)
RBC: 2.86 MIL/uL — ABNORMAL LOW (ref 4.22–5.81)
RBC: 2.64 MIL/uL — ABNORMAL LOW (ref 4.22–5.81)
RDW: 14.6 % (ref 11.5–15.5)
RDW: 14.6 % (ref 11.5–15.5)
WBC: 7.6 10*3/uL (ref 4.0–10.5)
WBC: 1.8 10*3/uL — ABNORMAL LOW (ref 4.0–10.5)
nRBC: 0 % (ref 0.0–0.2)
nRBC: 2.3 % — ABNORMAL HIGH (ref 0.0–0.2)

## 2019-06-02 LAB — COMPREHENSIVE METABOLIC PANEL
ALT: 28 U/L (ref 0–44)
ALT: 71 U/L — ABNORMAL HIGH (ref 0–44)
AST: 27 U/L (ref 15–41)
AST: 109 U/L — ABNORMAL HIGH (ref 15–41)
Albumin: 4 g/dL (ref 3.5–5.0)
Albumin: 3.7 g/dL (ref 3.5–5.0)
Alkaline Phosphatase: 190 U/L — ABNORMAL HIGH (ref 38–126)
Alkaline Phosphatase: 318 U/L — ABNORMAL HIGH (ref 38–126)
Anion gap: 20 — ABNORMAL HIGH (ref 5–15)
Anion gap: 20 — ABNORMAL HIGH (ref 5–15)
BUN: 121 mg/dL — ABNORMAL HIGH (ref 6–20)
BUN: 61 mg/dL — ABNORMAL HIGH (ref 6–20)
CO2: 20 mmol/L — ABNORMAL LOW (ref 22–32)
CO2: 21 mmol/L — ABNORMAL LOW (ref 22–32)
Calcium: 7.3 mg/dL — ABNORMAL LOW (ref 8.9–10.3)
Calcium: 7.8 mg/dL — ABNORMAL LOW (ref 8.9–10.3)
Chloride: 101 mmol/L (ref 98–111)
Chloride: 98 mmol/L (ref 98–111)
Creatinine, Ser: 15.54 mg/dL — ABNORMAL HIGH (ref 0.61–1.24)
Creatinine, Ser: 8.76 mg/dL — ABNORMAL HIGH (ref 0.61–1.24)
GFR calc Af Amer: 4 mL/min — ABNORMAL LOW (ref >60)
GFR calc Af Amer: 8 mL/min — ABNORMAL LOW (ref >60)
GFR calc non Af Amer: 3 mL/min — ABNORMAL LOW (ref >60)
GFR calc non Af Amer: 7 mL/min — ABNORMAL LOW (ref >60)
Glucose, Bld: 180 mg/dL — ABNORMAL HIGH (ref 70–99)
Glucose, Bld: 108 mg/dL — ABNORMAL HIGH (ref 70–99)
Potassium: 6.4 mmol/L (ref 3.5–5.1)
Potassium: 4 mmol/L (ref 3.5–5.1)
Sodium: 141 mmol/L (ref 135–145)
Sodium: 139 mmol/L (ref 135–145)
Total Bilirubin: 0.8 mg/dL (ref 0.3–1.2)
Total Bilirubin: 2.3 mg/dL — ABNORMAL HIGH (ref 0.3–1.2)
Total Protein: 8 g/dL (ref 6.5–8.1)
Total Protein: 7.4 g/dL (ref 6.5–8.1)

## 2019-06-02 LAB — BLOOD GAS, ARTERIAL
Acid-Base Excess: 1.7 mmol/L (ref 0.0–2.0)
Bicarbonate: 24.4 mmol/L (ref 20.0–28.0)
FIO2: 100
O2 Saturation: 99.7 %
Patient temperature: 37
pCO2 arterial: 32 mmHg (ref 32.0–48.0)
pH, Arterial: 7.49 — ABNORMAL HIGH (ref 7.350–7.450)
pO2, Arterial: 195 mmHg — ABNORMAL HIGH (ref 83.0–108.0)

## 2019-06-02 LAB — GLUCOSE, CAPILLARY
Glucose-Capillary: 104 mg/dL — ABNORMAL HIGH (ref 70–99)
Glucose-Capillary: 66 mg/dL — ABNORMAL LOW (ref 70–99)
Glucose-Capillary: 86 mg/dL (ref 70–99)
Glucose-Capillary: 244 mg/dL — ABNORMAL HIGH (ref 70–99)
Glucose-Capillary: 97 mg/dL (ref 70–99)
Glucose-Capillary: 82 mg/dL (ref 70–99)
Glucose-Capillary: 88 mg/dL (ref 70–99)

## 2019-06-02 LAB — RESPIRATORY PANEL BY RT PCR (FLU A&B, COVID)
Influenza A by PCR: NEGATIVE
Influenza B by PCR: NEGATIVE
SARS Coronavirus 2 by RT PCR: NEGATIVE

## 2019-06-02 LAB — CBC
HCT: 26 % — ABNORMAL LOW (ref 39.0–52.0)
Hemoglobin: 8.3 g/dL — ABNORMAL LOW (ref 13.0–17.0)
MCH: 32.8 pg (ref 26.0–34.0)
MCHC: 31.9 g/dL (ref 30.0–36.0)
MCV: 102.8 fL — ABNORMAL HIGH (ref 80.0–100.0)
Platelets: 106 10*3/uL — ABNORMAL LOW (ref 150–400)
RBC: 2.53 MIL/uL — ABNORMAL LOW (ref 4.22–5.81)
RDW: 14.6 % (ref 11.5–15.5)
WBC: 6.4 10*3/uL (ref 4.0–10.5)
nRBC: 0 % (ref 0.0–0.2)

## 2019-06-02 LAB — LACTIC ACID, PLASMA
Lactic Acid, Venous: 1 mmol/L (ref 0.5–1.9)
Lactic Acid, Venous: 1.1 mmol/L (ref 0.5–1.9)

## 2019-06-02 LAB — PROCALCITONIN: Procalcitonin: >150 ng/mL

## 2019-06-02 LAB — HIV ANTIBODY (ROUTINE TESTING W REFLEX): HIV Screen 4th Generation wRfx: NONREACTIVE

## 2019-06-02 LAB — PHOSPHORUS: Phosphorus: 7.9 mg/dL — ABNORMAL HIGH (ref 2.5–4.6)

## 2019-06-02 MED ORDER — INSULIN ASPART 100 UNIT/ML ~~LOC~~ SOLN
0.0000 [IU] | Freq: Three times a day (TID) | SUBCUTANEOUS | Status: DC
  Administered 2019-06-04: 1 [IU] via SUBCUTANEOUS
  Administered 2019-06-06 (×3): 2 [IU] via SUBCUTANEOUS
  Filled 2019-06-02 (×4): qty 1

## 2019-06-02 MED ORDER — LIDOCAINE-PRILOCAINE 2.5-2.5 % EX CREA
1.0000 "application " | TOPICAL_CREAM | CUTANEOUS | Status: DC | PRN
  Filled 2019-06-02: qty 5

## 2019-06-02 MED ORDER — LORAZEPAM 2 MG/ML IJ SOLN
1.0000 mg | Freq: Once | INTRAMUSCULAR | Status: AC
  Administered 2019-06-02: 1 mg via INTRAVENOUS

## 2019-06-02 MED ORDER — ALTEPLASE 2 MG IJ SOLR
2.0000 mg | Freq: Once | INTRAMUSCULAR | Status: DC | PRN
  Filled 2019-06-02: qty 2

## 2019-06-02 MED ORDER — CARVEDILOL 12.5 MG PO TABS
12.5000 mg | ORAL_TABLET | Freq: Two times a day (BID) | ORAL | Status: DC
  Filled 2019-06-02: qty 1
  Filled 2019-06-02: qty 2
  Filled 2019-06-02 (×2): qty 1

## 2019-06-02 MED ORDER — ATORVASTATIN CALCIUM 20 MG PO TABS
80.0000 mg | ORAL_TABLET | Freq: Every morning | ORAL | Status: DC
  Filled 2019-06-02 (×2): qty 1

## 2019-06-02 MED ORDER — VANCOMYCIN HCL 1500 MG/300ML IV SOLN: 1500.0000 mg | Freq: Once | INTRAVENOUS | Status: DC

## 2019-06-02 MED ORDER — LORAZEPAM 2 MG/ML IJ SOLN
1.0000 mg | Freq: Once | INTRAMUSCULAR | Status: AC
  Administered 2019-06-02: 20:00:00 1 mg via INTRAVENOUS

## 2019-06-02 MED ORDER — AMLODIPINE BESYLATE 5 MG PO TABS: 5.0000 mg | ORAL_TABLET | Freq: Every day | ORAL | Status: DC

## 2019-06-02 MED ORDER — DEXTROSE 5 % IV SOLN: INTRAVENOUS | Status: DC

## 2019-06-02 MED ORDER — ACETAMINOPHEN 650 MG RE SUPP
650.0000 mg | Freq: Four times a day (QID) | RECTAL | Status: DC | PRN
  Administered 2019-06-02 – 2019-06-03 (×2): 650 mg via RECTAL
  Filled 2019-06-02 (×2): qty 1

## 2019-06-02 MED ORDER — ONDANSETRON HCL 4 MG/2ML IJ SOLN: 4.0000 mg | Freq: Four times a day (QID) | INTRAMUSCULAR | Status: DC | PRN

## 2019-06-02 MED ORDER — SODIUM BICARBONATE 8.4 % IV SOLN
50.0000 meq | Freq: Once | INTRAVENOUS | Status: AC
  Administered 2019-06-02: 04:00:00 50 meq via INTRAVENOUS
  Filled 2019-06-02: qty 50

## 2019-06-02 MED ORDER — PENTAFLUOROPROP-TETRAFLUOROETH EX AERO
1.0000 "application " | INHALATION_SPRAY | CUTANEOUS | Status: DC | PRN
  Filled 2019-06-02: qty 30

## 2019-06-02 MED ORDER — DEXTROSE 10 % IV BOLUS: 5.0000 mL | Freq: Once | INTRAVENOUS | Status: DC

## 2019-06-02 MED ORDER — SODIUM CHLORIDE 0.9 % IV SOLN: 100.0000 mL | INTRAVENOUS | Status: DC | PRN

## 2019-06-02 MED ORDER — LORAZEPAM 2 MG/ML IJ SOLN: 1.0000 mg | Freq: Once | INTRAMUSCULAR | Status: DC

## 2019-06-02 MED ORDER — GENTAMICIN IN SALINE 1.2-0.9 MG/ML-% IV SOLN: 60.0000 mg | Freq: Once | INTRAVENOUS | Status: DC

## 2019-06-02 MED ORDER — ACETAMINOPHEN 325 MG PO TABS: 650.0000 mg | ORAL_TABLET | Freq: Four times a day (QID) | ORAL | Status: DC | PRN

## 2019-06-02 MED ORDER — MAGNESIUM HYDROXIDE 400 MG/5ML PO SUSP: 30.0000 mL | Freq: Every day | ORAL | Status: DC | PRN

## 2019-06-02 MED ORDER — ONDANSETRON HCL 4 MG PO TABS
4.0000 mg | ORAL_TABLET | Freq: Four times a day (QID) | ORAL | Status: DC | PRN
  Filled 2019-06-02: qty 1

## 2019-06-02 MED ORDER — VANCOMYCIN HCL 2000 MG/400ML IV SOLN
2000.0000 mg | Freq: Once | INTRAVENOUS | Status: AC
  Administered 2019-06-02: 2000 mg via INTRAVENOUS
  Filled 2019-06-02: qty 400

## 2019-06-02 MED ORDER — HEPARIN SODIUM (PORCINE) 1000 UNIT/ML DIALYSIS
1000.0000 [IU] | INTRAMUSCULAR | Status: DC | PRN
  Filled 2019-06-02: qty 1

## 2019-06-02 MED ORDER — DEXTROSE 10 % IV SOLN: INTRAVENOUS | Status: DC

## 2019-06-02 MED ORDER — SODIUM POLYSTYRENE SULFONATE 15 GM/60ML PO SUSP
30.0000 g | Freq: Once | ORAL | Status: AC
  Administered 2019-06-02: 30 g via ORAL
  Filled 2019-06-02: qty 120

## 2019-06-02 MED ORDER — GLIPIZIDE 5 MG PO TABS: 5.0000 mg | ORAL_TABLET | Freq: Two times a day (BID) | ORAL | Status: DC

## 2019-06-02 MED ORDER — HYDRALAZINE HCL 50 MG PO TABS
25.0000 mg | ORAL_TABLET | Freq: Three times a day (TID) | ORAL | Status: DC
  Filled 2019-06-02 (×6): qty 1

## 2019-06-02 MED ORDER — LIDOCAINE HCL (PF) 1 % IJ SOLN: 5.0000 mL | INTRAMUSCULAR | Status: DC | PRN

## 2019-06-02 MED ORDER — TORSEMIDE 20 MG PO TABS
100.0000 mg | ORAL_TABLET | Freq: Every day | ORAL | Status: DC
  Filled 2019-06-02 (×3): qty 1

## 2019-06-02 MED ORDER — CHLORHEXIDINE GLUCONATE CLOTH 2 % EX PADS
6.0000 | MEDICATED_PAD | Freq: Every day | CUTANEOUS | Status: DC
  Administered 2019-06-05 – 2019-06-22 (×15): 6 via TOPICAL
  Filled 2019-06-02: qty 6

## 2019-06-02 MED ORDER — CALCIUM GLUCONATE-NACL 1-0.675 GM/50ML-% IV SOLN
1.0000 g | Freq: Once | INTRAVENOUS | Status: AC
  Administered 2019-06-02: 03:00:00 1000 mg via INTRAVENOUS
  Filled 2019-06-02: qty 50

## 2019-06-02 MED ORDER — DEXTROSE 50 % IV SOLN
25.0000 mL | Freq: Once | INTRAVENOUS | Status: AC
  Administered 2019-06-02: 25 mL via INTRAVENOUS

## 2019-06-02 MED ORDER — PIPERACILLIN-TAZOBACTAM 3.375 G IVPB
3.3750 g | Freq: Two times a day (BID) | INTRAVENOUS | Status: DC
  Administered 2019-06-02 – 2019-06-04 (×4): 3.375 g via INTRAVENOUS
  Filled 2019-06-02 (×8): qty 50

## 2019-06-02 MED ORDER — AMLODIPINE BESYLATE 10 MG PO TABS
10.0000 mg | ORAL_TABLET | Freq: Every day | ORAL | Status: DC
  Filled 2019-06-02: qty 2

## 2019-06-02 MED ORDER — HEPARIN SODIUM (PORCINE) 1000 UNIT/ML DIALYSIS
20.0000 [IU]/kg | INTRAMUSCULAR | Status: DC | PRN
  Filled 2019-06-02: qty 3

## 2019-06-02 MED ORDER — EPOETIN ALFA 4000 UNIT/ML IJ SOLN
4000.0000 [IU] | Freq: Once | INTRAMUSCULAR | Status: AC
  Administered 2019-06-02: 4000 [IU] via INTRAVENOUS
  Filled 2019-06-02: qty 1

## 2019-06-02 MED ORDER — LEVETIRACETAM IN NACL 1500 MG/100ML IV SOLN
1500.0000 mg | INTRAVENOUS | Status: AC
  Administered 2019-06-02: 1500 mg via INTRAVENOUS
  Filled 2019-06-02: qty 100

## 2019-06-02 MED ORDER — CALCIUM ACETATE (PHOS BINDER) 667 MG PO CAPS
2001.0000 mg | ORAL_CAPSULE | Freq: Three times a day (TID) | ORAL | Status: DC
  Filled 2019-06-02 (×9): qty 3

## 2019-06-02 MED ORDER — SODIUM CHLORIDE 0.9 % IV SOLN: 1.0000 g | Freq: Once | INTRAVENOUS | Status: DC

## 2019-06-02 MED ORDER — IRBESARTAN 150 MG PO TABS: 300.0000 mg | ORAL_TABLET | Freq: Every day | ORAL | Status: DC

## 2019-06-02 MED ORDER — INSULIN ASPART 100 UNIT/ML ~~LOC~~ SOLN
5.0000 [IU] | Freq: Once | SUBCUTANEOUS | Status: AC
  Administered 2019-06-02: 04:00:00 5 [IU] via SUBCUTANEOUS
  Filled 2019-06-02: qty 1

## 2019-06-02 MED ORDER — TRAZODONE HCL 50 MG PO TABS
25.0000 mg | ORAL_TABLET | Freq: Every evening | ORAL | Status: DC | PRN
  Filled 2019-06-02: qty 0.5

## 2019-06-02 MED ORDER — GABAPENTIN 300 MG PO CAPS
300.0000 mg | ORAL_CAPSULE | Freq: Every day | ORAL | Status: DC
  Administered 2019-06-02: 300 mg via ORAL
  Filled 2019-06-02 (×2): qty 1

## 2019-06-02 MED ORDER — DEXTROSE 50 % IV SOLN
1.0000 | Freq: Once | INTRAVENOUS | Status: AC
  Administered 2019-06-02: 50 mL via INTRAVENOUS
  Filled 2019-06-02: qty 50

## 2019-06-02 MED ORDER — ALBUTEROL SULFATE (2.5 MG/3ML) 0.083% IN NEBU
2.5000 mg | INHALATION_SOLUTION | Freq: Once | RESPIRATORY_TRACT | Status: AC
  Administered 2019-06-02: 2.5 mg via RESPIRATORY_TRACT
  Filled 2019-06-02: qty 3

## 2019-06-02 NOTE — Progress Notes (Signed)
Hd completed 

## 2019-06-02 NOTE — ED Notes (Signed)
1mg  ativan given

## 2019-06-02 NOTE — ED Provider Notes (Addendum)
Patient was admitted getting antibiotics went down for dialysis.  As I understand it he had a liter taken off in 3 hours and then said he did not feel good and did not want anymore.  He then became groggy and then passed out.  Rapid response was called patient was brought up to room 37 in C pod.  I was called to see the patient.  Patient is unresponsive breathing deep and fast.  His CBG was 104.  EKG was done did not show any acute pathology.  Chest x-ray for aspiration.  Patient CBG began dropping we will start some D10.  Patient was having right-sided twitching of his face and arm possibly leg as well.  These look like seizures.  We gave him Ativan 1 mg 3 times until it stopped and on Keppra IV.  CT was done does not show any acute pathology per radiology I reviewed the film.  Again this patient was admitted and I was called for rapid response and waiting to update the hospitalist about what happened.  Critical care time for this is 45 minutes.   Nena Polio, MD 06/06/2019 2042 EKG reading is sinus tachycardia rate of 113 left axis no acute ST-T wave changes.  EKG was read interpreted by me   Nena Polio, MD 06/11/2019 [id number]    Nena Polio, MD 05/21/2019 2044

## 2019-06-02 NOTE — Progress Notes (Addendum)
Charlie RN called report on patient from dialysis at approximately 1845 to send patient back to unit; stated there were no problems.   At approximately 1853, Charlie RN called from dialysis and stated this patient was "very lethargic," and not easy to arouse. Stated the patient would wake to answer a question "but go right back to sleep." When writer questioned about respiration rate >30, dialysis RN stated "he was really shivering." Charlie asked writer if we wanted to bring the patient back to the ED or call a rapid response. The decision was made to call a rapid on this patient.   Patient brought back to room 37 in the ED and Dr. Cinda Quest to bedside. See MAR and other notes from patient.

## 2019-06-02 NOTE — ED Notes (Signed)
Pt leaving for MRI.  

## 2019-06-02 NOTE — Progress Notes (Signed)
Discontinued treatemt per patient request.  Stated" I just cant stand it any longer" Dr Theador Hawthorne notified.

## 2019-06-02 NOTE — Progress Notes (Signed)
At 1139 CBG 66.   15g carbs given via grape juice at 1145.  CBG recheck at 1211- CBG 104.  Will continue with ACHS CBG checks.

## 2019-06-02 NOTE — ED Notes (Signed)
Pt currently grimaces to painful stimuli and will barely open eyes when commanded. Pt will not attempt to speak.

## 2019-06-02 NOTE — Consult Note (Signed)
Pharmacy Antibiotic Note  Jake Gomez is a 48 y.o. male with a medical history of type 2 diabetes mellitus, hypertension, obesity, and ESRD on HD MWF admitted on 06/01/2019 with sepsis. Patient with chills during HD session last Monday and the session was stopped mid-way. After he traveled to [place] and missed HD on [facility]. Presented to [facility] ED 12/19 with generalized malaise. He was hyperkalemic and went for HD during which he started experiencing chills. Pharmacy has been consulted for vancomycin and piperacillin/tazobactam dosing.  Patient is afebrile. No leukocytosis. PCT > 150 although confounded by ESRD.   Plan: Piperacillin/tazobactam 3.375 g q12h  Vancomycin 2 g LD x 1 followed by 1 g qHD  Continue to monitor dialysis plan, clinical need for antibiotics, and culture results.  Height: 5\' 8"  (172.7 cm) Weight: 246 lb (111.6 kg) IBW/kg (Calculated) : 68.4  Temp (24hrs), Avg:98 F (36.7 C), Min:97.9 F (36.6 C), Max:98.1 F (36.7 C)  Recent Labs  Lab 05/20/2019 0048 06/13/2019 0235 06/09/2019 0456  WBC 7.6  --  6.4  CREATININE 15.54*  --  15.46*  LATICACIDVEN  --  1.0 1.1    Estimated Creatinine Clearance: 7.2 mL/min (A) (by C-G formula based on SCr of 15.46 mg/dL (H)).    No Known Allergies  Antimicrobials this admission: Pip/tazo 12/19 >>  Vancomycin 12/19 >>   Dose adjustments this admission: N/A  Microbiology results: 12/19 BCx: Pending 12/19 BCx: Ngx12h 12/19 SARS-CoV-2/Influenza A/B: negative  Thank you for allowing pharmacy to be a part of this patient's care.  [place] Resident 05/29/2019 3:37 PM

## 2019-06-02 NOTE — ED Triage Notes (Signed)
Patient reports received dialysis Mon, Wed, Friday.  Reports he missed this week because he was in [place] due to a death in the family (went to [place] on Monday and returned today).

## 2019-06-02 NOTE — Consult Note (Signed)
Jake Gomez MRN: [id number] DOB/AGE: [birth year] 48 y.o. Primary Care Physician:Jones, Iven Finn, MD Admit date: 05/16/2019 Chief Complaint:  Chief Complaint  Patient presents with  . "Not Feeling Right"   HPI: Pt is a  48 y.o. African-American male with a past medical history of  Type 2 diabetes mellitus, hypertension and obstructive sleep apnea not on CPAP, as well as end-stage renal disease on hemodialysis on Monday Wednesday and Friday.    History of present illness dates back to Monday when patient stopped his dialysis treatment.  Patient later missed his treatment on Wednesday and Friday secondary to social issues.  Patient came into the ER today with chief complaint of not feeling well.   Upon presentation in the ER patient was found to have hyperkalemia with a potassium of 6.4 and EKG showed normal sinus rhythm with rate of 76 with left axis deviation and peaked T waves in V2 and inferior Q waves.   Patient was checked for  influenza a and B antigens and SARS Covid 2 PCR and the results came back came back negative.Patient is currently being admitted to a medically monitored bed for further evaluation and management.  Patient was seen in the ER Patient complains of generalized malaise  No complaint of cough/ fever. No complaint of headache or dizziness or blurred vision. No complaint ofchest pain or palpitations.   No complaint of  dysuria or hematuria or flank pain.   No complaint of  wheezing or hemoptysis or exposure to COVID-19.  Patient was later seen again in the dialysis unit Patient was complaining of chills   Past Medical History:  Diagnosis Date  . Diabetes mellitus without complication ([place])   . Hypertension   . Renal disorder   . Sleep apnea    can't afford CPAP        Family History  Problem Relation Age of Onset  . Thyroid disease Mother   . Stroke Father   . Cancer Father   . Diabetes Father     Social History:  reports that he quit smoking about  9 years ago. He quit smokeless tobacco use about 9 years ago. He reports previous drug use. He reports that he does not drink alcohol.   Allergies: No Known Allergies  (Not in a hospital admission)      [id number] from the symptoms mentioned above,there are no other symptoms referable to all systems reviewed.  Marland Kitchen amLODipine  10 mg Oral Daily  . amLODipine  5 mg Oral QHS  . atorvastatin  80 mg Oral q morning - 10a  . calcium acetate  2,001 mg Oral TID WC  . carvedilol  12.5 mg Oral BID WC  . gabapentin  300 mg Oral Daily  . hydrALAZINE  25 mg Oral TID  . insulin aspart  0-9 Units Subcutaneous TID WC  . torsemide  100 mg Oral Daily      Physical Exam: Vital signs in last 24 hours: Temp:  [97.9 F (36.6 C)-98.1 F (36.7 C)] 98.1 F (36.7 C) (12/19 1149) Pulse Rate:  [69-78] 78 (12/19 1149) Resp:  [12-20] 20 (12/19 1149) BP: (112-152)/(51-91) 136/91 (12/19 1149) SpO2:  [95 %-100 %] 100 % (12/19 1149) Weight:  [111.6 kg] 111.6 kg (12/19 0034) Weight change:     Intake/Output from previous day: No intake/output data recorded. No intake/output data recorded.   Physical Exam: General- pt is awake,alert, oriented to time place and person Resp- No acute REsp distress, decreased at bases CVS- S1S2 regular  in rate and rhythm GIT- BS+, soft, NT, ND EXT- NO LE Edema, Cyanosis CNS- CN 2-12 grossly intact. Moving all 4 extremities Psych- normal mood and affect Access- PC and AVF Patient has left AV fistula which is not mature enough for use yet  Lab Results: CBC Recent Labs    06/11/2019 0048 05/30/2019 0456  WBC 7.6 6.4  HGB 9.3* 8.3*  HCT 29.3* 26.0*  PLT 118* 106*    BMET Recent Labs    05/18/2019 0048 06/01/2019 0456  NA 141 143  K 6.4* 6.4*  CL 101 101  CO2 20* 21*  GLUCOSE 180* 227*  BUN 121* 123*  CREATININE 15.54* 15.46*  CALCIUM 7.3* 7.2*    MICRO Recent Results (from the past 240 hour(s))  Respiratory Panel by RT PCR (Flu A&B, Covid) -  Nasopharyngeal Swab     Status: None   Collection Time: 05/30/2019  2:35 AM   Specimen: Nasopharyngeal Swab  Result Value Ref Range Status   SARS Coronavirus 2 by RT PCR NEGATIVE NEGATIVE Final    Comment: (NOTE) SARS-CoV-2 target nucleic acids are NOT DETECTED. The SARS-CoV-2 RNA is generally detectable in upper respiratoy specimens during the acute phase of infection. The lowest concentration of SARS-CoV-2 viral copies this assay can detect is 131 copies/mL. A negative result does not preclude SARS-Cov-2 infection and should not be used as the sole basis for treatment or other patient management decisions. A negative result may occur with  improper specimen collection/handling, submission of specimen other than nasopharyngeal swab, presence of viral mutation(s) within the areas targeted by this assay, and inadequate number of viral copies (<131 copies/mL). A negative result must be combined with clinical observations, patient history, and epidemiological information. The expected result is Negative. Fact Sheet for Patients:  PinkCheek.be Fact Sheet for Healthcare Providers:  GravelBags.it This test is not yet ap proved or cleared by the [place] FDA and  has been authorized for detection and/or diagnosis of SARS-CoV-2 by FDA under an Emergency Use Authorization (EUA). This EUA will remain  in effect (meaning this test can be used) for the duration of the COVID-19 declaration under Section 564(b)(1) of the Act, 21 U.S.C. section 360bbb-3(b)(1), unless the authorization is terminated or revoked sooner.    Influenza A by PCR NEGATIVE NEGATIVE Final   Influenza B by PCR NEGATIVE NEGATIVE Final    Comment: (NOTE) The Xpert Xpress SARS-CoV-2/FLU/RSV assay is intended as an aid in  the diagnosis of influenza from Nasopharyngeal swab specimens and  should not be used as a sole basis for treatment. Nasal washings and  aspirates  are unacceptable for Xpert Xpress SARS-CoV-2/FLU/RSV  testing. Fact Sheet for Patients: PinkCheek.be Fact Sheet for Healthcare Providers: GravelBags.it This test is not yet approved or cleared by the [place] FDA and  has been authorized for detection and/or diagnosis of SARS-CoV-2 by  FDA under an Emergency Use Authorization (EUA). This EUA will remain  in effect (meaning this test can be used) for the duration of the  Covid-19 declaration under Section 564(b)(1) of the Act, 21  U.S.C. section 360bbb-3(b)(1), unless the authorization is  terminated or revoked. Performed at [facility], [address] [place], [place] [address]   Culture, blood (routine x 2)     Status: None (Preliminary result)   Collection Time: 06/08/2019  2:36 AM   Specimen: BLOOD  Result Value Ref Range Status   Specimen Description BLOOD LEFT ANTECUBITAL  Final   Special Requests   Final  BOTTLES DRAWN AEROBIC AND ANAEROBIC Blood Culture adequate volume   Culture   Final    NO GROWTH < 12 HOURS Performed at [facility], [address] [place], [place] [address]    Report Status PENDING  Incomplete  Culture, blood (routine x 2)     Status: None (Preliminary result)   Collection Time: 05/31/2019  2:36 AM   Specimen: BLOOD  Result Value Ref Range Status   Specimen Description BLOOD RIGHT FOREARM  Final   Special Requests   Final    BOTTLES DRAWN AEROBIC AND ANAEROBIC Blood Culture adequate volume   Culture   Final    NO GROWTH < 12 HOURS Performed at [facility], [address], [place], [place] [address]    Report Status PENDING  Incomplete      Lab Results  Component Value Date   PTH 19 07/19/2018   CALCIUM 7.2 (L) 06/06/2019   PHOS 7.9 (H) 05/17/2019      Impression: 1)Renal ESRD on hemodialysis  Patient is on Monday Wednesday Friday schedule Patient is nonadherent to his treatment patient  missed his treatment on Wednesday Friday and cut his treatment short on Monday We will dialyze patient today   2)HTN Blood pressure is currently stable Will suggest to start patient on his outpatient regimen   3)Anemia of chronic disease  HGb is not at  goal (9--11) This is most likely secondary to patient missing his Epogen dosing as patient is nonadherent to his treatment    4) secondary hyperparathyroidism CKD Mineral-Bone Disorder  Secondary Hyperparathyroidism  present  Phosphorus is not at goal. HD should help  5) hyperkalemia Secondary to ESRD We will dialyze patient with 2K bath   6)Acid base Co2 at goal  7) sepsis Patient started having chills when seen during dialysis This could be catheter related infection We will ask for cultures We will give patient IV Vanco and Zosyn We will ask for pharmacy consult to help with IV antibiotics   Plan:   Will dialyze patient today We will use lower K bath We will keep patient on Epogen Will also blood cultures We will give patient IV Vanco and IV Zosyn We will continue to follow Depending upon the culture results patient need may need permacath to be DC'd/exchanged    Jake Gomez s [facility] 06/10/2019, 11:52 AM

## 2019-06-02 NOTE — ED Notes (Signed)
Patient transported to CT 

## 2019-06-02 NOTE — ED Provider Notes (Signed)
[facility] Emergency Department Provider Note   ____________________________________________   First MD Initiated Contact with Patient 06/05/2019 0210     (approximate)  I have reviewed the triage vital signs and the nursing notes.   HISTORY  Chief Complaint "Not Feeling Right"    HPI Dix Hovel is a 48 y.o. male who presents to the ED from home with a chief complaint of generalized malaise.  Patient has ESRD on HD M/W/F.  He was having dialysis on Monday 12/14 when halfway through he began to experience chills so dialysis was stopped.  He was not evaluated by his doctor at that time.  Subsequently patient had to travel to [place] for a family funeral so missed his dialysis appointments on Wednesday and Friday.  Return to town today and complains of generalized weakness.  Denies fever, cough, chest pain, shortness of breath, abdominal pain, nausea or vomiting.  Patient is oliguric.      Past Medical History:  Diagnosis Date  . Diabetes mellitus without complication ([place])   . Hypertension   . Renal disorder   . Sleep apnea    can't afford CPAP    Patient Active Problem List   Diagnosis Date Noted  . Hyperkalemia 06/04/2019  . End stage renal disease ([place]) 02/27/2019  . COVID-19 virus infection 12/02/2018  . Fever 12/02/2018  . Sepsis ([place]) 07/11/2018  . SBP (spontaneous bacterial peritonitis) ([place]) 05/25/2018  . Intractable abdominal pain 05/02/2018  . Peritonitis associated with peritoneal dialysis ([place]) 04/26/2018  . Pleural effusion 06/13/2017  . Pneumonia involving left lung 06/13/2017  . Chest pain 05/26/2017  . ESRD on peritoneal dialysis ([place]) 05/26/2017  . Chronic renal disease, stage 4, severely decreased glomerular filtration rate between 15-29 mL/min/1.73 square meter ([place]) 01/30/2014  . Obesity 11/22/2013  . Acute-on-chronic renal failure ([place]) 09/29/2013  . Diabetes mellitus ([place]) 09/29/2013  . Hyperkalemia, diminished renal  excretion 09/29/2013  . Hypertension 09/29/2013    Past Surgical History:  Procedure Laterality Date  . AV FISTULA PLACEMENT Left 01/31/2019   Procedure: ARTERIOVENOUS (AV) FISTULA CREATION ( BRACHIOCEPHALIC );  Surgeon: Algernon Huxley, MD;  Location: [facility] ORS;  Service: Vascular;  Laterality: Left;  . DIALYSIS/PERMA CATHETER INSERTION N/A 07/19/2018   Procedure: DIALYSIS/PERMA CATHETER INSERTION;  Surgeon: Algernon Huxley, MD;  Location: [place] [facility];  Service: Cardiovascular;  Laterality: N/A;  . DIALYSIS/PERMA CATHETER INSERTION N/A 09/13/2018   Procedure: DIALYSIS/PERMA CATHETER INSERTION;  Surgeon: Katha Cabal, MD;  Location: [place] [facility];  Service: Cardiovascular;  Laterality: N/A;  . peritonal dialysis    . REMOVAL OF A DIALYSIS CATHETER N/A 07/17/2018   Procedure: REMOVAL OF A DIALYSIS CATHETER;  Surgeon: Algernon Huxley, MD;  Location: [facility] ORS;  Service: Vascular;  Laterality: N/A;    Prior to Admission medications   Medication Sig Start Date End Date Taking? Authorizing Provider  acetaminophen (TYLENOL) 325 MG tablet Take 1,000 mg by mouth every 6 (six) hours as needed for mild pain.    [provider]  amLODipine (NORVASC) 10 MG tablet Take 1 tablet (10 mg total) by mouth daily. Patient taking differently: Take 10 mg by mouth daily. 10mg  at night and 5mg  in the am- nephrology 07/23/18   Otila Back, MD  amLODipine (NORVASC) 5 MG tablet Take 5 mg by mouth at bedtime. 05/14/19   [provider]  atorvastatin (LIPITOR) 80 MG tablet Take 1 tablet (80 mg total) by mouth every morning. 10/17/18   Otilio Miu  C, MD  calcium acetate (PHOSLO) 667 MG capsule Take 2,001 mg by mouth 3 (three) times daily with meals.  05/27/17   [provider]  carvedilol (COREG) 12.5 MG tablet Take 1 tablet (12.5 mg total) by mouth 2 (two) times daily with a meal. 10/17/18   Juline Patch, MD  folic acid (FOLVITE) 1 MG tablet Take 1 tablet (1 mg total) by mouth daily.  05/06/18   Vaughan Basta, MD  gabapentin (NEURONTIN) 300 MG capsule Take 300 mg by mouth daily.    [provider]  glipiZIDE (GLUCOTROL) 5 MG tablet Take 1 tablet (5 mg total) by mouth 2 (two) times daily before a meal. 10/17/18   Juline Patch, MD  hydrALAZINE (APRESOLINE) 25 MG tablet Take 25 mg by mouth 3 (three) times daily. nephrology 09/28/18   [provider]  Insulin Syringes, Disposable, U-100 1 ML MISC 1 each by Does not apply route daily. 12/29/17   Juline Patch, MD  irbesartan (AVAPRO) 300 MG tablet Take 300 mg by mouth at bedtime.  05/14/17   [provider]  [place] 1ML/31G 31G X 5/16" 1 ML MISC 1 EACH BY DOSE NOT APPLY ROUTE DAILY 12/29/17   [provider]  sulfamethoxazole-trimethoprim (BACTRIM) 400-80 MG tablet Take 1 tablet by mouth 2 (two) times daily. 02/27/19   Kris Hartmann, NP  torsemide (DEMADEX) 100 MG tablet Take 100 mg by mouth daily.    [provider]  vitamin B-12 1000 MCG tablet Take 1 tablet (1,000 mcg total) by mouth daily. 05/06/18   Vaughan Basta, MD    Allergies Patient has no known allergies.  Family History  Problem Relation Age of Onset  . Thyroid disease Mother   . Stroke Father   . Cancer Father   . Diabetes Father     Social History Social History   Tobacco Use  . Smoking status: Former Smoker    Quit date: 11/14/2009    Years since quitting: 9.5  . Smokeless tobacco: Former Systems developer    Quit date: 07/16/2009  Substance Use Topics  . Alcohol use: No  . Drug use: Not Currently    Comment: history 10 years ago   No urine production    Review of Systems  Constitutional: Positive for generalized malaise.  Positive for chills Eyes: No visual changes. ENT: No sore throat. Cardiovascular: Denies chest pain. Respiratory: Denies shortness of breath. Gastrointestinal: No abdominal pain.  No nausea, no vomiting.  No diarrhea.  No constipation. Genitourinary: Negative for  dysuria. Musculoskeletal: Negative for back pain. Skin: Negative for rash. Neurological: Negative for headaches, focal weakness or numbness.   ____________________________________________   PHYSICAL EXAM:  VITAL SIGNS: ED Triage Vitals  Enc Vitals Group     BP 06/03/2019 0034 (!) 152/51     Pulse Rate 05/18/2019 0034 77     Resp 05/17/2019 0034 20     Temp 06/10/2019 0034 97.9 F (36.6 C)     Temp Source 05/18/2019 0034 Oral     SpO2 05/15/2019 0034 100 %     Weight 05/31/2019 0034 246 lb (111.6 kg)     Height 06/08/2019 0034 5\' 8"  (1.727 m)     Head Circumference --      Peak Flow --      Pain Score 05/26/2019 0042 0     Pain Loc --      Pain Edu? --      Excl. in [place]? --  Constitutional: Alert and oriented.  Weak appearing and in mild acute distress. Eyes: Conjunctivae are normal. PERRL. EOMI. Head: Atraumatic. Nose: No congestion/rhinnorhea. Mouth/Throat: Mucous membranes are moist.  Oropharynx non-erythematous. Neck: No stridor.   Cardiovascular: Normal rate, regular rhythm. Grossly normal heart sounds.  Good peripheral circulation. Respiratory: Normal respiratory effort.  No retractions. Lungs with faint bibasilar rales. Gastrointestinal: Soft and nontender to light or deep palpation. No distention. No abdominal bruits. No CVA tenderness. Musculoskeletal: No lower extremity tenderness nor edema.  No joint effusions. Neurologic:  Normal speech and language. No gross focal neurologic deficits are appreciated. No gait instability. Skin:  Skin is warm, dry and intact. No rash noted. Psychiatric: Mood and affect are normal. Speech and behavior are normal.  ____________________________________________   LABS (all labs ordered are listed, but only abnormal results are displayed)  Labs Reviewed  CBC WITH DIFFERENTIAL/PLATELET - Abnormal; Notable for the following components:      Result Value   RBC 2.86 (*)    Hemoglobin 9.3 (*)    HCT 29.3 (*)    MCV 102.4 (*)    Platelets 118  (*)    Abs Immature Granulocytes 0.11 (*)    All other components within normal limits  COMPREHENSIVE METABOLIC PANEL - Abnormal; Notable for the following components:   Potassium 6.4 (*)    CO2 20 (*)    Glucose, Bld 180 (*)    BUN 121 (*)    Creatinine, Ser 15.54 (*)    Calcium 7.3 (*)    Alkaline Phosphatase 190 (*)    GFR calc non Af Amer 3 (*)    GFR calc Af Amer 4 (*)    Anion gap 20 (*)    All other components within normal limits  RESPIRATORY PANEL BY RT PCR (FLU A&B, COVID)  CULTURE, BLOOD (ROUTINE X 2)  CULTURE, BLOOD (ROUTINE X 2)  LACTIC ACID, PLASMA  PROCALCITONIN  LACTIC ACID, PLASMA  HIV ANTIBODY (ROUTINE TESTING W REFLEX)  BASIC METABOLIC PANEL  CBC  TROPONIN I (HIGH SENSITIVITY)  TROPONIN I (HIGH SENSITIVITY)   ____________________________________________  EKG  ED ECG REPORT I, Zaydah Nawabi J, the attending physician, personally viewed and interpreted this ECG.   Date: 06/13/2019  EKG Time: 0057  Rate: 76  Rhythm: normal EKG, normal sinus rhythm  Axis: LAD  Intervals:none  ST&T Change: Mildly peaked T waves  ____________________________________________  RADIOLOGY  ED MD interpretation: Cardiomegaly with mild vascular congestion  Official radiology report(s): DG Chest 1 View  Result Date: 06/14/2019 CLINICAL DATA:  Chest pain.  Missed dialysis. EXAM: CHEST  1 VIEW COMPARISON:  08/16/2018 FINDINGS: Right-sided dialysis catheter tip at the atrial caval junction. Unchanged cardiomegaly. Left pleural effusion versus pleural thickening and scarring, unchanged from prior exam. Overall low lung volumes. Probable vascular congestion. No pneumothorax. IMPRESSION: Low lung volumes. Cardiomegaly with probable vascular congestion. Chronic left pleural effusion/thickening and basilar scarring. Electronically Signed   By: Keith Rake M.D.   On: 05/20/2019 02:40    ____________________________________________   PROCEDURES  Procedure(s) performed  (including Critical Care):  Procedures  CRITICAL CARE Performed by: Paulette Blanch   Total critical care time: 30 minutes  Critical care time was exclusive of separately billable procedures and treating other patients.  Critical care was necessary to treat or prevent imminent or life-threatening deterioration.  Critical care was time spent personally by me on the following activities: development of treatment plan with patient and/or surrogate as well as nursing, discussions with consultants, evaluation of patient's response  to treatment, examination of patient, obtaining history from patient or surrogate, ordering and performing treatments and interventions, ordering and review of laboratory studies, ordering and review of radiographic studies, pulse oximetry and re-evaluation of patient's condition.  ____________________________________________   INITIAL IMPRESSION / ASSESSMENT AND PLAN / ED COURSE  As part of my medical decision making, I reviewed the following data within the [place] notes reviewed and incorporated, Labs reviewed, EKG interpreted, Old chart reviewed, Radiograph reviewed, Discussed with admitting physician and Notes from prior ED visits     Rishan Ortolani was evaluated in Emergency Department on 05/19/2019 for the symptoms described in the history of present illness. He was evaluated in the context of the global COVID-19 pandemic, which necessitated consideration that the patient might be at risk for infection with the SARS-CoV-2 virus that causes COVID-19. Institutional protocols and algorithms that pertain to the evaluation of patients at risk for COVID-19 are in a state of rapid change based on information released by regulatory bodies including the CDC and federal and state organizations. These policies and algorithms were followed during the patient's care in the ED.    48 year old male with ESRD on HD who presents with generalized malaise  and chills.  Missed 2.5 sessions of dialysis this week and recent travel to [place].  Differential diagnosis includes but is not limited to pulmonary edema, ACS, hyperkalemia, pneumonia, sepsis, etc.  Patient is hyperkalemic with mildly peaked T waves on EKG.  Presently his room air saturation is 100%.  Will initiate hyperkalemia treatment, obtain chest x-ray, check blood cultures, lactic acid and procalcitonin for complaints of chills.  Discussed with hospitalist to evaluate patient in the emergency department for admission.      ____________________________________________   FINAL CLINICAL IMPRESSION(S) / ED DIAGNOSES  Final diagnoses:  ESRD (end stage renal disease) on dialysis [facility])  Hyperkalemia     ED Discharge Orders    None       Note:  This document was prepared using Dragon voice recognition software and may include unintentional dictation errors.   Paulette Blanch, MD 06/03/2019 [phone]

## 2019-06-02 NOTE — ED Notes (Signed)
Pt placed on nonrebreather 15L and now 100%. Pt was 87% on RA.

## 2019-06-02 NOTE — ED Notes (Signed)
CBG 82. MD notified.

## 2019-06-02 NOTE — Progress Notes (Signed)
PROGRESS NOTE    Jake Gomez  [id number] DOB: 1971/02/22 DOA: 06/01/2019 PCP: Juline Patch, MD      Assessment & Plan:   Active Problems:   Hyperkalemia   Severe hyperkalemia: in the setting of ESRD on HD and missing 2-1/2 sessions this week. Evidently pt had a death in the family and had to travel to [place]. Educated on the importance of not missing HD, pt verbalized his understanding. Continue to hold ARB. S/p calcium gluconate, kayexalate, albuterol, insulin, phoslo & NaHCO3. Renal diet. Will get HD today. Nephro consulted and recs apprec  Metabolic acidosis: should improve w/ HD. Management per nephro  ACD: likely secondary to ESRD. No need for a transfusion currently. Will continue to monitor   DM2: continue on aspart. Will start SSI w/ accuchecks. Continue to hold home dose of glipizide    Hypertension: continue amlodipine, carvedilol, toresmide. Continue to hold irbesartan.  Peripheral neuropathy: continue on gabapentin  HLD: continue on statin  Thrombocytopenia: etiology unclear. Will continue to monitor    DVT prophylaxis: heparin Code Status: full  Family Communication:  Disposition Plan:    Consultants:   nephro   Procedures:    Antimicrobials:    Subjective: Pt c/o malaise   Objective: Vitals:   05/25/2019 0715 05/24/2019 0730 05/22/2019 0745 05/22/2019 0800  BP:  139/64  126/74  Pulse: 72 76 72 72  Resp: 12 13 12 12   Temp:      TempSrc:      SpO2: 97% 97% 97% 97%  Weight:      Height:       No intake or output data in the 24 hours ending 06/04/2019 0834 Filed Weights   05/18/2019 0034  Weight: 111.6 kg    Examination:  General exam: Appears calm and comfortable  Respiratory system: diminished breath sounds b/l otherwise clear. Respiratory effort normal. Cardiovascular system: S1 & S2 +. No rubs, gallops or clicks.  Gastrointestinal system: Abdomen is nondistended, soft and nontender. Normal bowel sounds heard. Central nervous  system: Alert and oriented. No focal neurological deficits. Psychiatry: Judgement and insight appear normal. Flat mood and affect.     Data Reviewed: I have personally reviewed following labs and imaging studies  CBC: Recent Labs  Lab 05/19/2019 0048 05/24/2019 0456  WBC 7.6 6.4  NEUTROABS 4.9  --   HGB 9.3* 8.3*  HCT 29.3* 26.0*  MCV 102.4* 102.8*  PLT 118* [id number]*   Basic Metabolic Panel: Recent Labs  Lab 06/12/2019 0048 05/19/2019 0456  NA 141 143  K 6.4* 6.4*  CL 101 101  CO2 20* 21*  GLUCOSE 180* 227*  BUN 121* 123*  CREATININE 15.54* 15.46*  CALCIUM 7.3* 7.2*  PHOS  --  7.9*   GFR: Estimated Creatinine Clearance: 7.2 mL/min (A) (by C-G formula based on SCr of 15.46 mg/dL (H)). Liver Function Tests: Recent Labs  Lab 05/19/2019 0048  AST 27  ALT 28  ALKPHOS 190*  BILITOT 0.8  PROT 8.0  ALBUMIN 4.0   No results for input(s): LIPASE, AMYLASE in the last 168 hours. No results for input(s): AMMONIA in the last 168 hours. Coagulation Profile: No results for input(s): INR, PROTIME in the last 168 hours. Cardiac Enzymes: No results for input(s): CKTOTAL, CKMB, CKMBINDEX, TROPONINI in the last 168 hours. BNP (last 3 results) No results for input(s): PROBNP in the last 8760 hours. HbA1C: No results for input(s): HGBA1C in the last 72 hours. CBG: No results for input(s): GLUCAP in the last 168 hours.  Lipid Profile: No results for input(s): CHOL, HDL, LDLCALC, TRIG, CHOLHDL, LDLDIRECT in the last 72 hours. Thyroid Function Tests: No results for input(s): TSH, T4TOTAL, FREET4, T3FREE, THYROIDAB in the last 72 hours. Anemia Panel: No results for input(s): VITAMINB12, FOLATE, FERRITIN, TIBC, IRON, RETICCTPCT in the last 72 hours. Sepsis Labs: Recent Labs  Lab 06/05/2019 0235 05/31/2019 0456  PROCALCITON >150.00  --   LATICACIDVEN 1.0 1.1    Recent Results (from the past 240 hour(s))  Respiratory Panel by RT PCR (Flu A&B, Covid) - Nasopharyngeal Swab     Status: None    Collection Time: 05/24/2019  2:35 AM   Specimen: Nasopharyngeal Swab  Result Value Ref Range Status   SARS Coronavirus 2 by RT PCR NEGATIVE NEGATIVE Final    Comment: (NOTE) SARS-CoV-2 target nucleic acids are NOT DETECTED. The SARS-CoV-2 RNA is generally detectable in upper respiratoy specimens during the acute phase of infection. The lowest concentration of SARS-CoV-2 viral copies this assay can detect is 131 copies/mL. A negative result does not preclude SARS-Cov-2 infection and should not be used as the sole basis for treatment or other patient management decisions. A negative result may occur with  improper specimen collection/handling, submission of specimen other than nasopharyngeal swab, presence of viral mutation(s) within the areas targeted by this assay, and inadequate number of viral copies (<131 copies/mL). A negative result must be combined with clinical observations, patient history, and epidemiological information. The expected result is Negative. Fact Sheet for Patients:  PinkCheek.be Fact Sheet for Healthcare Providers:  GravelBags.it This test is not yet ap proved or cleared by the [place] FDA and  has been authorized for detection and/or diagnosis of SARS-CoV-2 by FDA under an Emergency Use Authorization (EUA). This EUA will remain  in effect (meaning this test can be used) for the duration of the COVID-19 declaration under Section 564(b)(1) of the Act, 21 U.S.C. section 360bbb-3(b)(1), unless the authorization is terminated or revoked sooner.    Influenza A by PCR NEGATIVE NEGATIVE Final   Influenza B by PCR NEGATIVE NEGATIVE Final    Comment: (NOTE) The Xpert Xpress SARS-CoV-2/FLU/RSV assay is intended as an aid in  the diagnosis of influenza from Nasopharyngeal swab specimens and  should not be used as a sole basis for treatment. Nasal washings and  aspirates are unacceptable for Xpert Xpress  SARS-CoV-2/FLU/RSV  testing. Fact Sheet for Patients: PinkCheek.be Fact Sheet for Healthcare Providers: GravelBags.it This test is not yet approved or cleared by the [place] FDA and  has been authorized for detection and/or diagnosis of SARS-CoV-2 by  FDA under an Emergency Use Authorization (EUA). This EUA will remain  in effect (meaning this test can be used) for the duration of the  Covid-19 declaration under Section 564(b)(1) of the Act, 21  U.S.C. section 360bbb-3(b)(1), unless the authorization is  terminated or revoked. Performed at [facility] [address] [place], [place] [address]   Culture, blood (routine x 2)     Status: None (Preliminary result)   Collection Time: 05/22/2019  2:36 AM   Specimen: BLOOD  Result Value Ref Range Status   Specimen Description BLOOD LEFT ANTECUBITAL  Final   Special Requests   Final    BOTTLES DRAWN AEROBIC AND ANAEROBIC Blood Culture adequate volume   Culture   Final    NO GROWTH < 12 HOURS Performed at [facility], [address] [place],  [address]    Report Status PENDING  Incomplete  Culture, blood (routine  x 2)     Status: None (Preliminary result)   Collection Time: 05/18/2019  2:36 AM   Specimen: BLOOD  Result Value Ref Range Status   Specimen Description BLOOD RIGHT FOREARM  Final   Special Requests   Final    BOTTLES DRAWN AEROBIC AND ANAEROBIC Blood Culture adequate volume   Culture   Final    NO GROWTH < 12 HOURS Performed at [facility], [address], [place], [place] [address]    Report Status PENDING  Incomplete         Radiology Studies: DG Chest 1 View  Result Date: 06/04/2019 CLINICAL DATA:  Chest pain.  Missed dialysis. EXAM: CHEST  1 VIEW COMPARISON:  08/16/2018 FINDINGS: Right-sided dialysis catheter tip at the atrial caval junction. Unchanged cardiomegaly. Left pleural effusion versus pleural  thickening and scarring, unchanged from prior exam. Overall low lung volumes. Probable vascular congestion. No pneumothorax. IMPRESSION: Low lung volumes. Cardiomegaly with probable vascular congestion. Chronic left pleural effusion/thickening and basilar scarring. Electronically Signed   By: Keith Rake M.D.   On: 05/22/2019 02:40        Scheduled Meds: . amLODipine  10 mg Oral Daily  . amLODipine  5 mg Oral QHS  . atorvastatin  80 mg Oral q morning - 10a  . calcium acetate  2,001 mg Oral TID WC  . carvedilol  12.5 mg Oral BID WC  . gabapentin  300 mg Oral Daily  . hydrALAZINE  25 mg Oral TID  . torsemide  100 mg Oral Daily   Continuous Infusions: . alteplase (TPA-ACTIVASE) *DIALYSIS CATH* 5 mg infusion    . alteplase (TPA-ACTIVASE) *DIALYSIS CATH* 5 mg infusion    . alteplase (TPA-ACTIVASE) *DIALYSIS CATH* 5 mg infusion    . alteplase (TPA-ACTIVASE) *DIALYSIS CATH* 5 mg infusion    . alteplase (TPA-ACTIVASE) *DIALYSIS CATH* 5 mg infusion    . alteplase (TPA-ACTIVASE) *DIALYSIS CATH* 5 mg infusion       LOS: 0 days    Time spent: 65    Wyvonnia Dusky, MD [facility] Pager [phone]  If 7PM-7AM, please contact night-coverage [email or web address] Password [facility] 05/21/2019, 8:34 AM

## 2019-06-02 NOTE — ED Notes (Signed)
Pt cleansed of stool. New brief and sheet applied.

## 2019-06-02 NOTE — H&P (Addendum)
[facility] [place] NAME: Jake Gomez    MR#:  [id number]  DATE OF BIRTH:  16-Jul-1970  DATE OF ADMISSION:  05/16/2019  PRIMARY CARE PHYSICIAN: Juline Patch, MD   REQUESTING/REFERRING PHYSICIAN: Lurline Hare, MD CHIEF COMPLAINT:   Chief Complaint  Patient presents with  . "Not Feeling Right"    HISTORY OF PRESENT ILLNESS:  Jake Gomez  is a 48 y.o. African-American male with a known history of 2 diabetes mellitus, hypertension and obstructive sleep apnea not on CPAP, as well as end-stage renal disease on hemodialysis on Monday Wednesday and Friday.  The patient has been having chills during his hemodialysis session on Monday and he stopped halfway the session.  He then went to [place] and missed hemodialysis on Wednesday and Friday.  He came to the ER today with generalized malaise.  He denied any fever.  No headache or dizziness or blurred vision.  No chest pain or palpitations.  No dysuria or hematuria or flank pain.  No cough or wheezing or hemoptysis or exposure to COVID-19.  Upon presentation to the emergency room, blood pressure was 152/51 with otherwise normal vital signs.  Labs revealed a potassium of 6.4 with a BUN of 121 and creatinine 15.5 anion gap of 20 and alk phos 190.  Lactic acid was 1.  CBC showed anemia close to baseline portable chest ray showed low lung volumes with cardiomegaly and probable vascular congestion and chronic left pleural effusion/thickening and basal scarring.  EKG showed normal sinus rhythm with rate of 76 with left axis deviation and peaked T waves in V2 and inferior Q waves.  His influenza a and B antigens and SARS Covid 2 PCR came back negative.  The patient was given nebulized albuterol, an ampule of calcium gluconate, D50/insulin and 1 ampoule of sodium bicarbonate as well as 30 g of p.o. Kayexalate.  He will be admitted to a medically monitored bed for further evaluation and management.  PAST MEDICAL HISTORY:   Past  Medical History:  Diagnosis Date  . Diabetes mellitus without complication ([place])   . Hypertension   . Renal disorder   . Sleep apnea    can't afford CPAP  History of COVID-19 in September/2020 and June/2020  PAST SURGICAL HISTORY:   Past Surgical History:  Procedure Laterality Date  . AV FISTULA PLACEMENT Left 01/31/2019   Procedure: ARTERIOVENOUS (AV) FISTULA CREATION ( BRACHIOCEPHALIC );  Surgeon: Algernon Huxley, MD;  Location: [facility];  Service: Vascular;  Laterality: Left;  . DIALYSIS/PERMA CATHETER INSERTION N/A 07/19/2018   Procedure: DIALYSIS/PERMA CATHETER INSERTION;  Surgeon: Algernon Huxley, MD;  Location: [facility];  Service: Cardiovascular;  Laterality: N/A;  . DIALYSIS/PERMA CATHETER INSERTION N/A 09/13/2018   Procedure: DIALYSIS/PERMA CATHETER INSERTION;  Surgeon: Katha Cabal, MD;  Location: [place];  Service: Cardiovascular;  Laterality: N/A;  . peritonal dialysis    . REMOVAL OF A DIALYSIS CATHETER N/A 07/17/2018   Procedure: REMOVAL OF A DIALYSIS CATHETER;  Surgeon: Algernon Huxley, MD;  Location: [facility];  Service: Vascular;  Laterality: N/A;    SOCIAL HISTORY:   Social History   Tobacco Use  . Smoking status: Former Smoker    Quit date: 11/14/2009    Years since quitting: 9.5  . Smokeless tobacco: Former Systems developer    Quit date: 07/16/2009  Substance Use Topics  . Alcohol use: No    FAMILY HISTORY:   Family History  Problem Relation  Age of Onset  . Thyroid disease Mother   . Stroke Father   . Cancer Father   . Diabetes Father     DRUG ALLERGIES:  No Known Allergies  REVIEW OF SYSTEMS:   ROS As per history of present illness. All pertinent systems were reviewed above. Constitutional,  HEENT, cardiovascular, respiratory, GI, GU, musculoskeletal, neuro, psychiatric, endocrine,  integumentary and hematologic systems were reviewed and are otherwise  negative/unremarkable except for positive findings mentioned above in the  HPI.   MEDICATIONS AT HOME:   Prior to Admission medications   Medication Sig Start Date End Date Taking? Authorizing Provider  acetaminophen (TYLENOL) 325 MG tablet Take 1,000 mg by mouth every 6 (six) hours as needed for mild pain.    [provider]  amLODipine (NORVASC) 10 MG tablet Take 1 tablet (10 mg total) by mouth daily. Patient taking differently: Take 10 mg by mouth daily. '10mg'$  at night and '5mg'$  in the am- nephrology 07/23/18   Otila Back, MD  amLODipine (NORVASC) 5 MG tablet Take 5 mg by mouth at bedtime. 05/14/19   [provider]  atorvastatin (LIPITOR) 80 MG tablet Take 1 tablet (80 mg total) by mouth every morning. 10/17/18   Juline Patch, MD  calcium acetate (PHOSLO) 667 MG capsule Take 2,001 mg by mouth 3 (three) times daily with meals.  05/27/17   [provider]  carvedilol (COREG) 12.5 MG tablet Take 1 tablet (12.5 mg total) by mouth 2 (two) times daily with a meal. 10/17/18   Juline Patch, MD  folic acid (FOLVITE) 1 MG tablet Take 1 tablet (1 mg total) by mouth daily. 05/06/18   Vaughan Basta, MD  gabapentin (NEURONTIN) 300 MG capsule Take 300 mg by mouth daily.    [provider]  glipiZIDE (GLUCOTROL) 5 MG tablet Take 1 tablet (5 mg total) by mouth 2 (two) times daily before a meal. 10/17/18   Juline Patch, MD  hydrALAZINE (APRESOLINE) 25 MG tablet Take 25 mg by mouth 3 (three) times daily. nephrology 09/28/18   [provider]  Insulin Syringes, Disposable, U-100 1 ML MISC 1 each by Does not apply route daily. 12/29/17   Juline Patch, MD  irbesartan (AVAPRO) 300 MG tablet Take 300 mg by mouth at bedtime.  05/14/17   [provider]  [place] 1ML/31G 31G X 5/16" 1 ML MISC 1 EACH BY DOSE NOT APPLY ROUTE DAILY 12/29/17   [provider]  sulfamethoxazole-trimethoprim (BACTRIM) 400-80 MG tablet Take 1 tablet by mouth 2 (two) times daily. 02/27/19   Kris Hartmann, NP  torsemide (DEMADEX)  100 MG tablet Take 100 mg by mouth daily.    [provider]  vitamin B-12 1000 MCG tablet Take 1 tablet (1,000 mcg total) by mouth daily. 05/06/18   Vaughan Basta, MD      VITAL SIGNS:  Blood pressure (!) 130/58, pulse 72, temperature 97.9 F (36.6 C), temperature source Oral, resp. rate 14, height '5\' 8"'$  (1.727 m), weight 111.6 kg, SpO2 99 %.  PHYSICAL EXAMINATION:  Physical Exam  GENERAL:  48 y.o.-year-old African-American male patient lying in the bed with no acute distress.  EYES: Pupils equal, round, reactive to light and accommodation. No scleral icterus. Extraocular muscles intact.  HEENT: Head atraumatic, normocephalic. Oropharynx and nasopharynx clear.  NECK:  Supple, no jugular venous distention. No thyroid enlargement, no tenderness.  LUNGS: Minimally diminished bibasal breath sounds otherwise clear to auscultation bilaterally.  CARDIOVASCULAR: Regular rate and  rhythm, S1, S2 normal. No murmurs, rubs, or gallops.  ABDOMEN: Soft, nondistended, nontender. Bowel sounds present. No organomegaly or mass.  EXTREMITIES: No pedal edema, cyanosis, or clubbing.  NEUROLOGIC: Cranial nerves II through XII are intact. Muscle strength 5/5 in all extremities. Sensation intact. Gait not checked.  PSYCHIATRIC: The patient is alert and oriented x 3.  Normal affect and good eye contact. SKIN: No obvious rash, lesion, or ulcer.   LABORATORY PANEL:   CBC Recent Labs  Lab 05/20/2019 0048  WBC 7.6  HGB 9.3*  HCT 29.3*  PLT 118*   ------------------------------------------------------------------------------------------------------------------  Chemistries  Recent Labs  Lab 05/27/2019 0048  NA 141  K 6.4*  CL 101  CO2 20*  GLUCOSE 180*  BUN 121*  CREATININE 15.54*  CALCIUM 7.3*  AST 27  ALT 28  ALKPHOS 190*  BILITOT 0.8   ------------------------------------------------------------------------------------------------------------------  Cardiac Enzymes No  results for input(s): TROPONINI in the last 168 hours. ------------------------------------------------------------------------------------------------------------------  RADIOLOGY:  DG Chest 1 View  Result Date: 06/08/2019 CLINICAL DATA:  Chest pain.  Missed dialysis. EXAM: CHEST  1 VIEW COMPARISON:  08/16/2018 FINDINGS: Right-sided dialysis catheter tip at the atrial caval junction. Unchanged cardiomegaly. Left pleural effusion versus pleural thickening and scarring, unchanged from prior exam. Overall low lung volumes. Probable vascular congestion. No pneumothorax. IMPRESSION: Low lung volumes. Cardiomegaly with probable vascular congestion. Chronic left pleural effusion/thickening and basilar scarring. Electronically Signed   By: Keith Rake M.D.   On: 06/10/2019 02:40      IMPRESSION AND PLAN:   1.  Severe hyperkalemia in the setting of end-stage renal disease on hemodialysis status post missing 2-1/2 sessions this week.  The patient will be admitted to a medical monitored bed.  He was aggressively managed for hyperkalemia potassium level will be followed.  A nephrology consultation will be obtained for hemodialysis this morning by Dr. Holley Raring.  I notified him regarding the patient.  We will hold off his ARB.  2.  Type 2 diabetes mellitus.  The patient will be placed on supplement coverage with NovoLog and will continue his basal coverage.  We will hold off Glucotrol XL for now.  3.  Hypertension.  We will continue amlodipine and hold off Cozaar.  4.  Peripheral neuropathy.  Neurontin will be resumed.  5.  DVT prophylaxis.  Subcutaneous heparin.   All the records are reviewed and case discussed with ED provider. The plan of care was discussed in details with the patient (and family). I answered all questions. The patient agreed to proceed with the above mentioned plan. Further management will depend upon hospital course.   CODE STATUS: Full code  TOTAL TIME TAKING CARE OF THIS  PATIENT: 50 minutes.    Christel Mormon M.D on 06/14/2019 at 3:30 AM  [facility]    From 7 PM-7 AM, contact night-coverage [email or web address]  CC: Primary care physician; Juline Patch, MD   Note: This dictation was prepared with Dragon dictation along with smaller phrase technology. Any transcriptional errors that result from this process are unintentional.

## 2019-06-02 NOTE — Progress Notes (Addendum)
Requested by RN to assess pateint who is post dialysis with decreaased mental status Bedside exam minimal grimace and localization with right arm to sternal rub. Minimal eye opening, non focal, minnmal moan. Reeactive to painful stimulit of extremities on right, not left.  Sats 100 % on NRB, deep breathing pattern.  glucose results and admin of D50 earlier noted MRI stat ordered r/o acute cerebral event  Further review with cardiology and elevated troponins, likely not from ACS, I suspect from demand of fluid overload.  Currently he is in SR rate 109 with BP 132/58  After MRI  Mental status improving.  Garbled speech and lethargy, equal lower extremity strength, left upper arm still appears weaker than right Oxygen saturation stable with on 4LNC requirement  MRI results  IMPRESSION: MRI HEAD IMPRESSION:  1. Motion degraded exam. 2. Patchy multifocal acute ischemic nonhemorrhagic infarcts involving the periventricular white matter of the corona radiata and centrum semi ovale bilaterally. No significant mass effect. 3. Otherwise grossly normal brain MRI for age.  MRA HEAD IMPRESSION:  1. Technically limited exam due to motion. 2. Grossly negative intracranial MRA. No large vessel occlusion, hemodynamically significant stenosis, or other acute vascular abnormality.  Aspirin ordered,  Already on statin Echo ordered MRI finding ischemic/possiblitiy of infectious, added RPR, syphillis, herpes, RMSF and lyme disease profile  Rehab therapies  Being no large vessel occlusion on MRI findings and no known last well time, he was not candidate for tpa - Neuro consulted to follow

## 2019-06-02 NOTE — ED Provider Notes (Signed)
Repeat EKG read and interpreted by me shows sinus tach at a rate of 112 left axis much better baseline than previous 1 is a small amount of ST depression in lead I there is ST elevation by 1 mm which is not significant in V3 otherwise very similar to previous EKG. Patient's troponin is going up.  We then repeated again.   Nena Polio, MD 05/17/2019 2114

## 2019-06-03 ENCOUNTER — Emergency Department
Admit: 2019-06-03 | Discharge: 2019-06-03 | Disposition: A | Discharge status: Home / Self Care | Payer: Medicare Other | Attending: Neurology | Admitting: Neurology

## 2019-06-03 ENCOUNTER — Emergency Department: Payer: Medicare Other

## 2019-06-03 ENCOUNTER — Encounter: Payer: Self-pay | Admitting: Family Medicine

## 2019-06-03 DIAGNOSIS — I639 Cerebral infarction, unspecified: Secondary | ICD-10-CM | POA: Diagnosis present

## 2019-06-03 LAB — CBC
HCT: 28.2 % — ABNORMAL LOW (ref 39.0–52.0)
Hemoglobin: 9.3 g/dL — ABNORMAL LOW (ref 13.0–17.0)
MCH: 32.7 pg (ref 26.0–34.0)
MCHC: 33 g/dL (ref 30.0–36.0)
MCV: 99.3 fL (ref 80.0–100.0)
Platelets: 47 10*3/uL — ABNORMAL LOW (ref 150–400)
RBC: 2.84 MIL/uL — ABNORMAL LOW (ref 4.22–5.81)
RDW: 14.8 % (ref 11.5–15.5)
WBC: 19.6 10*3/uL — ABNORMAL HIGH (ref 4.0–10.5)
nRBC: 0 % (ref 0.0–0.2)

## 2019-06-03 LAB — VANCOMYCIN, RANDOM: Vancomycin Rm: 5

## 2019-06-03 LAB — GLUCOSE, CAPILLARY
Glucose-Capillary: 102 mg/dL — ABNORMAL HIGH (ref 70–99)
Glucose-Capillary: 104 mg/dL — ABNORMAL HIGH (ref 70–99)
Glucose-Capillary: 65 mg/dL — ABNORMAL LOW (ref 70–99)
Glucose-Capillary: 67 mg/dL — ABNORMAL LOW (ref 70–99)
Glucose-Capillary: 67 mg/dL — ABNORMAL LOW (ref 70–99)
Glucose-Capillary: 67 mg/dL — ABNORMAL LOW (ref 70–99)
Glucose-Capillary: 68 mg/dL — ABNORMAL LOW (ref 70–99)
Glucose-Capillary: 73 mg/dL (ref 70–99)
Glucose-Capillary: 92 mg/dL (ref 70–99)
Glucose-Capillary: 96 mg/dL (ref 70–99)

## 2019-06-03 LAB — BASIC METABOLIC PANEL
Anion gap: 17 — ABNORMAL HIGH (ref 5–15)
BUN: 76 mg/dL — ABNORMAL HIGH (ref 6–20)
CO2: 23 mmol/L (ref 22–32)
Calcium: 6.6 mg/dL — ABNORMAL LOW (ref 8.9–10.3)
Chloride: 98 mmol/L (ref 98–111)
Creatinine, Ser: 10.34 mg/dL — ABNORMAL HIGH (ref 0.61–1.24)
GFR calc Af Amer: 6 mL/min — ABNORMAL LOW (ref >60)
GFR calc non Af Amer: 5 mL/min — ABNORMAL LOW (ref >60)
Glucose, Bld: 82 mg/dL (ref 70–99)
Potassium: 5.5 mmol/L — ABNORMAL HIGH (ref 3.5–5.1)
Sodium: 138 mmol/L (ref 135–145)

## 2019-06-03 LAB — TROPONIN I (HIGH SENSITIVITY): Troponin I (High Sensitivity): 46 ng/L — ABNORMAL HIGH (ref <18)

## 2019-06-03 LAB — RPR: RPR Ser Ql: NONREACTIVE

## 2019-06-03 LAB — MRSA PCR SCREENING: MRSA by PCR: NEGATIVE

## 2019-06-03 MED ORDER — HEPARIN SODIUM (PORCINE) 5000 UNIT/ML IJ SOLN
5000.0000 [IU] | Freq: Three times a day (TID) | INTRAMUSCULAR | Status: DC
  Administered 2019-06-03 (×2): 5000 [IU] via SUBCUTANEOUS
  Filled 2019-06-03 (×3): qty 1

## 2019-06-03 MED ORDER — SODIUM CHLORIDE 0.9 % IV SOLN
250.0000 mL | INTRAVENOUS | Status: DC
  Administered 2019-06-04 – 2019-06-19 (×5): 250 mL via INTRAVENOUS

## 2019-06-03 MED ORDER — NOREPINEPHRINE 4 MG/250ML-% IV SOLN: 0.0000 ug/min | INTRAVENOUS | Status: DC

## 2019-06-03 MED ORDER — STROKE: EARLY STAGES OF RECOVERY BOOK: Freq: Once | Status: DC

## 2019-06-03 MED ORDER — NOREPINEPHRINE 4 MG/250ML-% IV SOLN
0.0000 ug/min | INTRAVENOUS | Status: DC
  Filled 2019-06-03: qty 250

## 2019-06-03 MED ORDER — VANCOMYCIN HCL IN DEXTROSE 1-5 GM/200ML-% IV SOLN
1000.0000 mg | INTRAVENOUS | Status: DC
  Filled 2019-06-03: qty 200

## 2019-06-03 MED ORDER — ASPIRIN 300 MG RE SUPP
300.0000 mg | Freq: Every day | RECTAL | Status: DC
  Administered 2019-06-03: 300 mg via RECTAL
  Filled 2019-06-03: qty 1

## 2019-06-03 MED ORDER — IPRATROPIUM-ALBUTEROL 0.5-2.5 (3) MG/3ML IN SOLN: 3.0000 mL | Freq: Four times a day (QID) | RESPIRATORY_TRACT | Status: DC | PRN

## 2019-06-03 MED ORDER — VANCOMYCIN HCL IN DEXTROSE 1-5 GM/200ML-% IV SOLN
1000.0000 mg | Freq: Once | INTRAVENOUS | Status: AC
  Administered 2019-06-03: 1000 mg via INTRAVENOUS
  Filled 2019-06-03: qty 200

## 2019-06-03 MED ORDER — NOREPINEPHRINE BITARTRATE 1 MG/ML IV SOLN
2.0000 ug/min | INTRAVENOUS | Status: DC
  Administered 2019-06-03: 4 ug/min via INTRAVENOUS
  Filled 2019-06-03: qty 4

## 2019-06-03 MED ORDER — IOHEXOL 350 MG/ML SOLN
100.0000 mL | Freq: Once | INTRAVENOUS | Status: AC | PRN
  Administered 2019-06-03: 100 mL via INTRAVENOUS

## 2019-06-03 MED ORDER — ASPIRIN EC 325 MG PO TBEC: 325.0000 mg | DELAYED_RELEASE_TABLET | Freq: Every day | ORAL | Status: DC

## 2019-06-03 NOTE — ED Notes (Signed)
Update given to Pt's daughter per his request. Daughter requests to be updated when pt moved to ICU.

## 2019-06-03 NOTE — ED Notes (Signed)
Attempted to call pt daughter per pt request. Was not able to get in touch with her.

## 2019-06-03 NOTE — ED Notes (Signed)
Pt able to open eyes, nod head, and grip with both hands. Grip is weak. Pt also answered questions with verbal "yeah" and "no". Pt asked if extreme fatigue/lethargy happens sometimes after his dialysis. Pt nodded up and down. Pt switched from mask to [place] at 6L as he requested water d/t a dry mouth. Repositioned and sat pt up in bed. Pt assisted to carefully sip water. 99% on 6L.

## 2019-06-03 NOTE — Progress Notes (Signed)
!  725 Admitted from ED via stretcher on 4 liters nasal cannula- switched to BiPAP 30% 15/5 back up rate if 14.

## 2019-06-03 NOTE — Consult Note (Addendum)
Pharmacy Antibiotic Note  Jake Gomez is a 48 y.o. male with a medical history of type 2 diabetes mellitus, hypertension, obesity, and ESRD on HD MWF admitted on 05/17/2019 with sepsis. Patient with chills during HD session last Monday and the session was stopped mid-way. After he traveled to [place] and missed HD on WeFr. Presented to [facility] ED 12/19 with generalized malaise. He was hyperkalemic and went for HD during which he started experiencing chills. Pharmacy has been consulted for vancomycin and piperacillin/tazobactam dosing.  Patient is febrile today. Leukopenic yesterday now with leukocytosis. Thrombocytopenic- SQ heparin d/c. PCT > 150 although confounded by ESRD. There is concern for catheter related infection. Imaging concerning for left lower lobe pneumonia. MRI with multiple, acute, small infarcts bilaterally- suspected embolic etiology per chart- septic emboli not ruled out. ECHO completed today.  Per nephrology patient will not go to HD today and tentative plan for HD 12/21. Normal dialysis schedule is Mo[facility].  Plan: Piperacillin/tazobactam 3.375 g q12h  Vancomycin 1 g qHD  Continue to monitor dialysis plan, clinical need for antibiotics, and culture results.  Height: 5\' 8"  (172.7 cm) Weight: 246 lb (111.6 kg) IBW/kg (Calculated) : 68.4  Temp (24hrs), Avg:100.1 F (37.8 C), Min:98.1 F (36.7 C), Max:102 F (38.9 C)  Recent Labs  Lab 05/24/2019 0048 06/14/2019 0235 05/31/2019 0456 06/13/2019 1941 06/03/19 1136  WBC 7.6  --  6.4 1.8* 19.6*  CREATININE 15.54*  --  15.46* 8.76* 10.34*  LATICACIDVEN  --  1.0 1.1  --   --     Estimated Creatinine Clearance: 10.8 mL/min (A) (by C-G formula based on SCr of 10.34 mg/dL (H)).    No Known Allergies  Antimicrobials this admission: Pip/tazo 12/19 >>  Vancomycin 12/19 >>   Dose adjustments this admission: N/A  Microbiology results: 12/19 BCx: Ng x <24h 12/19 BCx: Ngx 1 day 12/19 SARS-CoV-2/Influenza A/B: negative  Thank  you for allowing pharmacy to be a part of this patient's care.  [place] Resident 06/03/2019 1:52 PM

## 2019-06-03 NOTE — ED Notes (Signed)
Upon entry to room dextrose 10% infusion was not hooked up to patient. This RN started continuous infusion of D10 at 72ml/hr per order.

## 2019-06-03 NOTE — ED Notes (Signed)
Neurologist at bedside. 

## 2019-06-03 NOTE — ED Notes (Signed)
Echo being performed at bedside at this time.

## 2019-06-03 NOTE — ED Notes (Signed)
Pt brief dry  

## 2019-06-03 NOTE — ED Notes (Signed)
RT contacted- instructs to place pt on 4 L [place] and bipap will be set up in ICU.

## 2019-06-03 NOTE — ED Notes (Signed)
Daughter at bedside.

## 2019-06-03 NOTE — ED Notes (Signed)
Pt repositioned by this rn, sam rn, and Kristen NT. Pt placed in clean brief and clean sheets placed on bed at this time.

## 2019-06-03 NOTE — Consult Note (Signed)
Referring Physician: Mansy    Chief Complaint: Weakness  HPI: Navian Aguila is an 48 y.o. male with a history of DM, HTN and ESRD who has difficulty providing much of his history today due to SOB that interferes with talking.  The patient presented 12/19 not feeling well after missing some of his dialysis sessions.  Was admitted.  Went to dialysis, became groggy and passed out.  Was noted to have right sided twitching of his face, arm and leg requiring 3mg  of Ativan.  Work up included an MRI of the brain that was abnormal.  Initial NIHSS of 9.    Date last known well: Unable to determine Time last known well: Unable to determine tPA Given: No: Unable to determine LKW  Past Medical History:  Diagnosis Date  . Diabetes mellitus without complication ([place])   . Hypertension   . Renal disorder   . Sleep apnea    can't afford CPAP    Past Surgical History:  Procedure Laterality Date  . AV FISTULA PLACEMENT Left 01/31/2019   Procedure: ARTERIOVENOUS (AV) FISTULA CREATION ( BRACHIOCEPHALIC );  Surgeon: Algernon Huxley, MD;  Location: [facility];  Service: Vascular;  Laterality: Left;  . DIALYSIS/PERMA CATHETER INSERTION N/A 07/19/2018   Procedure: DIALYSIS/PERMA CATHETER INSERTION;  Surgeon: Algernon Huxley, MD;  Location: [facility];  Service: Cardiovascular;  Laterality: N/A;  . DIALYSIS/PERMA CATHETER INSERTION N/A 09/13/2018   Procedure: DIALYSIS/PERMA CATHETER INSERTION;  Surgeon: Katha Cabal, MD;  Location: [facility];  Service: Cardiovascular;  Laterality: N/A;  . peritonal dialysis    . REMOVAL OF A DIALYSIS CATHETER N/A 07/17/2018   Procedure: REMOVAL OF A DIALYSIS CATHETER;  Surgeon: Algernon Huxley, MD;  Location: [facility];  Service: Vascular;  Laterality: N/A;    Family History  Problem Relation Age of Onset  . Thyroid disease Mother   . Stroke Father   . Cancer Father   . Diabetes Father    Social History:  reports that he quit smoking about 9 years ago. He quit  smokeless tobacco use about 9 years ago. He reports previous drug use. He reports that he does not drink alcohol.  Allergies: No Known Allergies  Medications:  I have reviewed the patient's current medications. Prior to Admission:  Prior to Admission medications   Medication Sig Start Date End Date Taking? Authorizing Provider  acetaminophen (TYLENOL) 325 MG tablet Take 1,000 mg by mouth every 6 (six) hours as needed for mild pain.   Yes [provider]  amLODipine (NORVASC) 10 MG tablet Take 1 tablet (10 mg total) by mouth daily. Patient taking differently: Take 10 mg by mouth daily. 10mg  at night and 5mg  in the am- nephrology 07/23/18  Yes Stark Jock, Jude, MD  atorvastatin (LIPITOR) 80 MG tablet Take 1 tablet (80 mg total) by mouth every morning. 10/17/18  Yes Juline Patch, MD  carvedilol (COREG) 12.5 MG tablet Take 1 tablet (12.5 mg total) by mouth 2 (two) times daily with a meal. 10/17/18  Yes Juline Patch, MD  folic acid (FOLVITE) 1 MG tablet Take 1 tablet (1 mg total) by mouth daily. 05/06/18  Yes Vaughan Basta, MD  gabapentin (NEURONTIN) 300 MG capsule Take 300 mg by mouth daily.   Yes [provider]  glipiZIDE (GLUCOTROL) 5 MG tablet Take 1 tablet (5 mg total) by mouth 2 (two) times daily before a meal. 10/17/18  Yes Juline Patch, MD  irbesartan (AVAPRO) 300 MG tablet Take  300 mg by mouth at bedtime.  05/14/17  Yes [provider]  torsemide (DEMADEX) 100 MG tablet Take 100 mg by mouth daily.   Yes [provider]     Scheduled: .  stroke: mapping our early stages of recovery book   Does not apply Once  . amLODipine  10 mg Oral Daily  . amLODipine  5 mg Oral QHS  . aspirin  300 mg Rectal Daily   Or  . aspirin EC  325 mg Oral Daily  . atorvastatin  80 mg Oral q morning - 10a  . calcium acetate  2,001 mg Oral TID WC  . carvedilol  12.5 mg Oral BID WC  . Chlorhexidine Gluconate Cloth  6 each Topical Q0600  . gabapentin  300 mg Oral Daily   . heparin  5,000 Units Subcutaneous Q8H  . hydrALAZINE  25 mg Oral TID  . insulin aspart  0-9 Units Subcutaneous TID WC  . torsemide  100 mg Oral Daily    ROS: History obtained from the patient  General ROS: negative for - chills, fatigue, fever, night sweats, weight gain or weight loss Psychological ROS: negative for - behavioral disorder, hallucinations, memory difficulties, mood swings or suicidal ideation Ophthalmic ROS: negative for - blurry vision, double vision, eye pain or loss of vision ENT ROS: negative for - epistaxis, nasal discharge, oral lesions, sore throat, tinnitus or vertigo Allergy and Immunology ROS: negative for - hives or itchy/watery eyes Hematological and Lymphatic ROS: negative for - bleeding problems, bruising or swollen lymph nodes Endocrine ROS: negative for - galactorrhea, hair pattern changes, polydipsia/polyuria or temperature intolerance Respiratory ROS: shortness of breath Cardiovascular ROS: negative for - chest pain, dyspnea on exertion, edema or irregular heartbeat Gastrointestinal ROS: negative for - abdominal pain, diarrhea, hematemesis, nausea/vomiting or stool incontinence Genito-Urinary ROS: negative for - dysuria, hematuria, incontinence or urinary frequency/urgency Musculoskeletal ROS: weakness Neurological ROS: as noted in HPI Dermatological ROS: negative for rash and skin lesion changes  Physical Examination: Blood pressure (!) 124/52, pulse (!) 105, temperature 98.8 F (37.1 C), resp. rate 20, height 5\' 8"  (1.727 m), weight 111.6 kg, SpO2 99 %.  HEENT-  Normocephalic, no lesions, without obvious abnormality.  Normal external eye and conjunctiva.  Normal TM's bilaterally.  Normal auditory canals and external ears. Normal external nose, mucus membranes and septum.  Normal pharynx. Cardiovascular- S1, S2 normal, pulses palpable throughout   Abdomen- soft, non-tender; bowel sounds normal; no masses,  no organomegaly Extremities- LE  edema Lymph-no adenopathy palpable Musculoskeletal-no joint tenderness, deformity or swelling Skin-warm and dry, no hyperpigmentation, vitiligo, or suspicious lesions  Neurological Examination   Mental Status: Alert, oriented, thought content appropriate.  Speech minimal but fluent without evidence of aphasia.  Able to follow 3 step commands without difficulty. Cranial Nerves: II: Discs flat bilaterally; Visual fields grossly normal, pupils equal, round, reactive to light and accommodation III,IV, VI: ptosis not present, extra-ocular motions intact bilaterally V,VII: smile symmetric, facial light touch sensation normal bilaterally VIII: hearing normal bilaterally IX,X: gag reflex present XI: bilateral shoulder shrug XII: midline tongue extension Motor: Lifts all extremities against gravity but does not overcome resistance.   Sensory: Pinprick and light touch intact throughout, bilaterally Deep Tendon Reflexes: Symmetric throughout Plantars: Right: mute   Left: mute Cerebellar: Normal finger-to-nose testing bilaterally Gait: not tested due to safety concerns    Laboratory Studies:  Basic Metabolic Panel: Recent Labs  Lab 05/21/2019 0048 06/08/2019 0456 05/23/2019 1941  NA 141 143 139  K  6.4* 6.4* 4.0  CL 101 101 98  CO2 20* 21* 21*  GLUCOSE 180* 227* 108*  BUN 121* 123* 61*  CREATININE 15.54* 15.46* 8.76*  CALCIUM 7.3* 7.2* 7.8*  PHOS  --  7.9*  --     Liver Function Tests: Recent Labs  Lab 05/17/2019 0048 05/26/2019 1941  AST 27 109*  ALT 28 71*  ALKPHOS 190* 318*  BILITOT 0.8 2.3*  PROT 8.0 7.4  ALBUMIN 4.0 3.7   No results for input(s): LIPASE, AMYLASE in the last 168 hours. No results for input(s): AMMONIA in the last 168 hours.  CBC: Recent Labs  Lab 05/29/2019 0048 05/22/2019 0456 05/28/2019 1941  WBC 7.6 6.4 1.8*  NEUTROABS 4.9  --  1.3*  HGB 9.3* 8.3* 8.6*  HCT 29.3* 26.0* 26.8*  MCV 102.4* 102.8* 101.5*  PLT 118* 106* 66*    Cardiac Enzymes: No  results for input(s): CKTOTAL, CKMB, CKMBINDEX, TROPONINI in the last 168 hours.  BNP: Invalid input(s): POCBNP  CBG: Recent Labs  Lab 05/16/2019 2019 05/23/2019 2045 06/03/19 0047 06/03/19 0744 06/03/19 0922  GLUCAP 82 88 104* 67* 27*    Microbiology: Results for orders placed or performed during the hospital encounter of 06/03/2019  Respiratory Panel by RT PCR (Flu A&B, Covid) - Nasopharyngeal Swab     Status: None   Collection Time: 05/28/2019  2:35 AM   Specimen: Nasopharyngeal Swab  Result Value Ref Range Status   SARS Coronavirus 2 by RT PCR NEGATIVE NEGATIVE Final    Comment: (NOTE) SARS-CoV-2 target nucleic acids are NOT DETECTED. The SARS-CoV-2 RNA is generally detectable in upper respiratoy specimens during the acute phase of infection. The lowest concentration of SARS-CoV-2 viral copies this assay can detect is 131 copies/mL. A negative result does not preclude SARS-Cov-2 infection and should not be used as the sole basis for treatment or other patient management decisions. A negative result may occur with  improper specimen collection/handling, submission of specimen other than nasopharyngeal swab, presence of viral mutation(s) within the areas targeted by this assay, and inadequate number of viral copies (<131 copies/mL). A negative result must be combined with clinical observations, patient history, and epidemiological information. The expected result is Negative. Fact Sheet for Patients:  PinkCheek.be Fact Sheet for Healthcare Providers:  GravelBags.it This test is not yet ap proved or cleared by the [place] FDA and  has been authorized for detection and/or diagnosis of SARS-CoV-2 by FDA under an Emergency Use Authorization (EUA). This EUA will remain  in effect (meaning this test can be used) for the duration of the COVID-19 declaration under Section 564(b)(1) of the Act, 21 U.S.C. section  360bbb-3(b)(1), unless the authorization is terminated or revoked sooner.    Influenza A by PCR NEGATIVE NEGATIVE Final   Influenza B by PCR NEGATIVE NEGATIVE Final    Comment: (NOTE) The Xpert Xpress SARS-CoV-2/FLU/RSV assay is intended as an aid in  the diagnosis of influenza from Nasopharyngeal swab specimens and  should not be used as a sole basis for treatment. Nasal washings and  aspirates are unacceptable for Xpert Xpress SARS-CoV-2/FLU/RSV  testing. Fact Sheet for Patients: PinkCheek.be Fact Sheet for Healthcare Providers: GravelBags.it This test is not yet approved or cleared by the [place] FDA and  has been authorized for detection and/or diagnosis of SARS-CoV-2 by  FDA under an Emergency Use Authorization (EUA). This EUA will remain  in effect (meaning this test can be used) for the duration of the  Covid-19 declaration under Section  564(b)(1) of the Act, 21  U.S.C. section 360bbb-3(b)(1), unless the authorization is  terminated or revoked. Performed at [facility], [address] [place], [place] [address]   Culture, blood (routine x 2)     Status: None (Preliminary result)   Collection Time: 05/21/2019  2:36 AM   Specimen: BLOOD  Result Value Ref Range Status   Specimen Description BLOOD LEFT ANTECUBITAL  Final   Special Requests   Final    BOTTLES DRAWN AEROBIC AND ANAEROBIC Blood Culture adequate volume   Culture   Final    NO GROWTH 1 DAY Performed at [facility], [address] [place], [place] [address]    Report Status PENDING  Incomplete  Culture, blood (routine x 2)     Status: None (Preliminary result)   Collection Time: 05/16/2019  2:36 AM   Specimen: BLOOD  Result Value Ref Range Status   Specimen Description BLOOD RIGHT FOREARM  Final   Special Requests   Final    BOTTLES DRAWN AEROBIC AND ANAEROBIC Blood Culture adequate volume   Culture   Final    NO GROWTH 1  DAY Performed at [facility], [address], [place], [place] [address]    Report Status PENDING  Incomplete  Culture, blood (routine x 2)     Status: None (Preliminary result)   Collection Time: 06/10/2019  3:10 PM   Specimen: BLOOD  Result Value Ref Range Status   Specimen Description BLOOD A-LINE  Final   Special Requests   Final    BOTTLES DRAWN AEROBIC AND ANAEROBIC Blood Culture results may not be optimal due to an excessive volume of blood received in culture bottles   Culture   Final    NO GROWTH < 24 HOURS Performed at [facility], [address], [place], [place] [address]    Report Status PENDING  Incomplete  Culture, blood (routine x 2)     Status: None (Preliminary result)   Collection Time: 05/23/2019  3:10 PM   Specimen: BLOOD  Result Value Ref Range Status   Specimen Description BLOOD A-LINE  Final   Special Requests   Final    BOTTLES DRAWN AEROBIC AND ANAEROBIC Blood Culture results may not be optimal due to an excessive volume of blood received in culture bottles   Culture   Final    NO GROWTH < 24 HOURS Performed at [facility], [address] [place], [place] [address]    Report Status PENDING  Incomplete    Coagulation Studies: No results for input(s): LABPROT, INR in the last 72 hours.  Urinalysis: No results for input(s): COLORURINE, LABSPEC, PHURINE, GLUCOSEU, HGBUR, BILIRUBINUR, KETONESUR, PROTEINUR, UROBILINOGEN, NITRITE, LEUKOCYTESUR in the last 168 hours.  Invalid input(s): APPERANCEUR  Lipid Panel: No results found for: CHOL, TRIG, HDL, CHOLHDL, VLDL, LDLCALC  HgbA1C:  Lab Results  Component Value Date   HGBA1C 5.4 10/17/2018    Urine Drug Screen:  No results found for: LABOPIA, COCAINSCRNUR, LABBENZ, AMPHETMU, THCU, LABBARB  Alcohol Level: No results for input(s): ETH in the last 168 hours.  Other results: EKG: sinus tachycardia at 112 bpm.  Imaging: DG Chest 1 View  Result Date: 06/11/2019 CLINICAL  DATA:  Chest pain.  Missed dialysis. EXAM: CHEST  1 VIEW COMPARISON:  08/16/2018 FINDINGS: Right-sided dialysis catheter tip at the atrial caval junction. Unchanged cardiomegaly. Left pleural effusion versus pleural thickening and scarring, unchanged from prior exam. Overall low lung volumes. Probable vascular congestion. No pneumothorax. IMPRESSION:  Low lung volumes. Cardiomegaly with probable vascular congestion. Chronic left pleural effusion/thickening and basilar scarring. Electronically Signed   By: Keith Rake M.D.   On: 05/17/2019 02:40   CT Head Wo Contrast  Result Date: 06/08/2019 CLINICAL DATA:  48 year old male with syncope. Patient with end-stage renal disease on dialysis. EXAM: CT HEAD WITHOUT CONTRAST TECHNIQUE: Contiguous axial images were obtained from the base of the skull through the vertex without intravenous contrast. COMPARISON:  11/17/2010 CT FINDINGS: Brain: No evidence of acute infarction, hemorrhage, hydrocephalus, extra-axial collection or mass lesion/mass effect. Vascular: Mild carotid atherosclerotic calcifications noted. Skull: Normal. Negative for fracture or focal lesion. Sinuses/Orbits: No acute finding. Other: None. IMPRESSION: No evidence of acute intracranial abnormality. Electronically Signed   By: Margarette Canada M.D.   On: 05/18/2019 20:20   MR ANGIO HEAD WO CONTRAST  Result Date: 06/03/2019 CLINICAL DATA:  Initial evaluation for acute altered mental status. EXAM: MRI HEAD WITHOUT CONTRAST MRA HEAD WITHOUT CONTRAST TECHNIQUE: Multiplanar, multiecho pulse sequences of the brain and surrounding structures were obtained without intravenous contrast. Angiographic images of the head were obtained using MRA technique without contrast. COMPARISON:  Prior CT from 06/09/2019. FINDINGS: MRI HEAD FINDINGS Brain: Examination moderately degraded by motion artifact. Cerebral volume within normal limits for age. Patchy multifocal areas of restricted diffusion seen involving the  periventricular white matter of the corona radiata and centrum semi ovale bilaterally, right slightly worse than left. Largest area of infarction seen on the right and measures up to 2.1 cm. No associated hemorrhage or mass effect. No other evidence for acute or subacute ischemia. Gray-white matter differentiation otherwise maintained. No encephalomalacia to suggest chronic cortical infarction. 8 mm focus of susceptibility artifact noted at the periventricular white matter of the right centrum semi ovale, consistent with a chronic microhemorrhage. No mass lesion, midline shift or mass effect. No hydrocephalus. No extra-axial fluid collection. No obvious intrinsic temporal lobe abnormality. Pituitary gland suprasellar region normal. Midline structures intact. Vascular: Major intracranial vascular flow voids grossly maintained at the skull base. Skull and upper cervical spine: Craniocervical junction normal. Upper cervical spine within normal limits. Bone marrow signal intensity normal. No scalp soft tissue abnormality. Sinuses/Orbits: Globes and orbital soft tissues within normal limits. Paranasal sinuses are largely clear. No significant mastoid effusion. Inner ear structures grossly normal. Other: None. MRA HEAD FINDINGS ANTERIOR CIRCULATION: Examination moderately degraded by motion artifact. Visualized distal cervical segments of the internal carotid arteries are widely patent with symmetric antegrade flow. Petrous, cavernous, and supraclinoid ICAs widely patent without appreciable flow-limiting stenosis. A1 segments patent bilaterally. Normal anterior communicating artery. Anterior cerebral arteries markedly limited assessment due to motion, but are grossly patent to their distal aspects. No M1 stenosis or occlusion. Negative MCA bifurcations. Distal MCA branches markedly limited in assessment due to motion, but are grossly perfused and symmetric. POSTERIOR CIRCULATION: Vertebral arteries patent to the  vertebrobasilar junction without stenosis. Left vertebral artery dominant. Patent right PICA. Left PICA not seen. Basilar widely patent to its distal aspect without stenosis. Superior cerebral arteries patent bilaterally. Right PCA primarily supplied via the basilar. Fetal type origin left PCA. Both PCAs well perfused to their distal aspects without stenosis. No appreciable aneurysm or other vascular abnormality. IMPRESSION: MRI HEAD IMPRESSION: 1. Motion degraded exam. 2. Patchy multifocal acute ischemic nonhemorrhagic infarcts involving the periventricular white matter of the corona radiata and centrum semi ovale bilaterally. No significant mass effect. 3. Otherwise grossly normal brain MRI for age. MRA HEAD IMPRESSION: 1. Technically limited exam due to  motion. 2. Grossly negative intracranial MRA. No large vessel occlusion, hemodynamically significant stenosis, or other acute vascular abnormality. Electronically Signed   By: Jeannine Boga M.D.   On: 06/03/2019 00:51   MR BRAIN WO CONTRAST  Result Date: 06/03/2019 CLINICAL DATA:  Initial evaluation for acute altered mental status. EXAM: MRI HEAD WITHOUT CONTRAST MRA HEAD WITHOUT CONTRAST TECHNIQUE: Multiplanar, multiecho pulse sequences of the brain and surrounding structures were obtained without intravenous contrast. Angiographic images of the head were obtained using MRA technique without contrast. COMPARISON:  Prior CT from 06/08/2019. FINDINGS: MRI HEAD FINDINGS Brain: Examination moderately degraded by motion artifact. Cerebral volume within normal limits for age. Patchy multifocal areas of restricted diffusion seen involving the periventricular white matter of the corona radiata and centrum semi ovale bilaterally, right slightly worse than left. Largest area of infarction seen on the right and measures up to 2.1 cm. No associated hemorrhage or mass effect. No other evidence for acute or subacute ischemia. Gray-white matter differentiation  otherwise maintained. No encephalomalacia to suggest chronic cortical infarction. 8 mm focus of susceptibility artifact noted at the periventricular white matter of the right centrum semi ovale, consistent with a chronic microhemorrhage. No mass lesion, midline shift or mass effect. No hydrocephalus. No extra-axial fluid collection. No obvious intrinsic temporal lobe abnormality. Pituitary gland suprasellar region normal. Midline structures intact. Vascular: Major intracranial vascular flow voids grossly maintained at the skull base. Skull and upper cervical spine: Craniocervical junction normal. Upper cervical spine within normal limits. Bone marrow signal intensity normal. No scalp soft tissue abnormality. Sinuses/Orbits: Globes and orbital soft tissues within normal limits. Paranasal sinuses are largely clear. No significant mastoid effusion. Inner ear structures grossly normal. Other: None. MRA HEAD FINDINGS ANTERIOR CIRCULATION: Examination moderately degraded by motion artifact. Visualized distal cervical segments of the internal carotid arteries are widely patent with symmetric antegrade flow. Petrous, cavernous, and supraclinoid ICAs widely patent without appreciable flow-limiting stenosis. A1 segments patent bilaterally. Normal anterior communicating artery. Anterior cerebral arteries markedly limited assessment due to motion, but are grossly patent to their distal aspects. No M1 stenosis or occlusion. Negative MCA bifurcations. Distal MCA branches markedly limited in assessment due to motion, but are grossly perfused and symmetric. POSTERIOR CIRCULATION: Vertebral arteries patent to the vertebrobasilar junction without stenosis. Left vertebral artery dominant. Patent right PICA. Left PICA not seen. Basilar widely patent to its distal aspect without stenosis. Superior cerebral arteries patent bilaterally. Right PCA primarily supplied via the basilar. Fetal type origin left PCA. Both PCAs well perfused to  their distal aspects without stenosis. No appreciable aneurysm or other vascular abnormality. IMPRESSION: MRI HEAD IMPRESSION: 1. Motion degraded exam. 2. Patchy multifocal acute ischemic nonhemorrhagic infarcts involving the periventricular white matter of the corona radiata and centrum semi ovale bilaterally. No significant mass effect. 3. Otherwise grossly normal brain MRI for age. MRA HEAD IMPRESSION: 1. Technically limited exam due to motion. 2. Grossly negative intracranial MRA. No large vessel occlusion, hemodynamically significant stenosis, or other acute vascular abnormality. Electronically Signed   By: Jeannine Boga M.D.   On: 06/03/2019 00:51   DG Chest Portable 1 View  Result Date: 05/31/2019 CLINICAL DATA:  Loss of consciousness EXAM: PORTABLE CHEST 1 VIEW COMPARISON:  June 02, 2019 FINDINGS: Again noted is mild cardiomegaly. A right-sided central venous catheter seen with the tip at the superior cavoatrial junction. There is prominence of the pulmonary vasculature. There is mildly increased hazy airspace opacity seen at the left lung base with pleural thickening. No acute osseous  abnormality. 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 IMPRESSION: Pulmonary vascular congestion and mild cardiomegaly. Unchanged chronic hazy airspace opacity at the left lung base/pleural thickening. Electronically Signed   By: Prudencio Pair M.D.   On: 06/08/2019 20:04    Assessment: 48 y.o. male with a history of DM, HTN and ESRD who overnight had an episode of syncope and seizure like activity.  Patient with multiple metabolic abnormalities/infection at presentation that may have provoked seizure like activity but MRI imaging performed as well and on review shows multiple acute, small infarcts bilaterally.  MRA unremarkable. Embolic etiology likely.  Can not rule out septic emboli.  Patient on no antiplatelet therapy prior to admission.   Carotid dopplers show no evidence of hemodynamically significant  stenosis.  Echocardiogram pending.    Stroke Risk Factors - diabetes mellitus and hypertension  Plan: 1. HgbA1c, fasting lipid panel 2. PT consult, OT consult, Speech consult 3. Echocardiogram with bubble study pending 4. Prophylactic therapy-Dual antiplatelet therapy with ASA 81mg  and Plavix 75mg  for three weeks with change to ASA 81mg  daily alone as monotherapy after that time. 5. NPO until RN stroke swallow screen 6. Telemetry monitoring 7. Frequent neuro checks 8. EEG   [name], MD Neurology [phone] 06/03/2019, 10:31 AM

## 2019-06-03 NOTE — ED Notes (Signed)
Report given to Myra, RN at this time.

## 2019-06-03 NOTE — Progress Notes (Addendum)
Jake Gomez  MRN: [id number]  DOB/AGE: 1970/12/08 48 y.o.  Primary Care Physician:Jones, Iven Finn, MD  Admit date: 06/03/2019  Chief Complaint:  Chief Complaint  Patient presents with  . "Not Feeling Right"    S-Pt presented on  05/29/2019 with  Chief Complaint  Patient presents with  . "Not Feeling Right"  . Patient was mildly confused in today's visit.  Patient was oriented to place and person.  Patient main complaint in today's visit was I do not feel right  Meds .  stroke: mapping our early stages of recovery book   Does not apply Once  . amLODipine  10 mg Oral Daily  . amLODipine  5 mg Oral QHS  . aspirin  300 mg Rectal Daily   Or  . aspirin EC  325 mg Oral Daily  . atorvastatin  80 mg Oral q morning - 10a  . calcium acetate  2,001 mg Oral TID WC  . carvedilol  12.5 mg Oral BID WC  . Chlorhexidine Gluconate Cloth  6 each Topical Q0600  . gabapentin  300 mg Oral Daily  . hydrALAZINE  25 mg Oral TID  . insulin aspart  0-9 Units Subcutaneous TID WC  . torsemide  100 mg Oral Daily         [id number] from the symptoms mentioned above,there are no other symptoms referable to all systems reviewed.  Physical Exam: Vital signs in last 24 hours: Temp:  [98.8 F (37.1 C)-102 F (38.9 C)] 99.3 F (37.4 C) (12/20 1537) Pulse Rate:  [93-113] 98 (12/20 1740) Resp:  [18-42] 24 (12/20 1740) BP: (56-192)/(25-146) 109/47 (12/20 1700) SpO2:  [68 %-100 %] 98 % (12/20 1740) Weight:  [111.6 kg] 111.6 kg (12/20 0855) Weight change:     Intake/Output from previous day: 12/19 0701 - 12/20 0700 In: -  Out: 1200  Total I/O In: 64 [IV Piggyback:50] Out: -    Physical Exam: General- pt is lethargic but follows commands Resp- moderate acute REsp distress, Rhonchi present CVS- S1S2 regular in rate and rhythm GIT- BS+, soft, NT, ND EXT- NO LE Edema, Cyanosis Access- PC and AVF Patient has left AV fistula which is not mature enough for use yet  Lab  Results: CBC Recent Labs    05/25/2019 1941 06/03/19 1136  WBC 1.8* 19.6*  HGB 8.6* 9.3*  HCT 26.8* 28.2*  PLT 66* 47*    BMET Recent Labs    05/22/2019 1941 06/03/19 1136  NA 139 138  K 4.0 5.5*  CL 98 98  CO2 21* 23  GLUCOSE 108* 82  BUN 61* 76*  CREATININE 8.76* 10.34*  CALCIUM 7.8* 6.6*    MICRO Recent Results (from the past 240 hour(s))  Respiratory Panel by RT PCR (Flu A&B, Covid) - Nasopharyngeal Swab     Status: None   Collection Time: 05/22/2019  2:35 AM   Specimen: Nasopharyngeal Swab  Result Value Ref Range Status   SARS Coronavirus 2 by RT PCR NEGATIVE NEGATIVE Final    Comment: (NOTE) SARS-CoV-2 target nucleic acids are NOT DETECTED. The SARS-CoV-2 RNA is generally detectable in upper respiratoy specimens during the acute phase of infection. The lowest concentration of SARS-CoV-2 viral copies this assay can detect is 131 copies/mL. A negative result does not preclude SARS-Cov-2 infection and should not be used as the sole basis for treatment or other patient management decisions. A negative result may occur with  improper specimen collection/handling, submission of specimen other than nasopharyngeal swab, presence of viral mutation(s)  within the areas targeted by this assay, and inadequate number of viral copies (<131 copies/mL). A negative result must be combined with clinical observations, patient history, and epidemiological information. The expected result is Negative. Fact Sheet for Patients:  PinkCheek.be Fact Sheet for Healthcare Providers:  GravelBags.it This test is not yet ap proved or cleared by the [place] FDA and  has been authorized for detection and/or diagnosis of SARS-CoV-2 by FDA under an Emergency Use Authorization (EUA). This EUA will remain  in effect (meaning this test can be used) for the duration of the COVID-19 declaration under Section 564(b)(1) of the Act, 21  U.S.C. section 360bbb-3(b)(1), unless the authorization is terminated or revoked sooner.    Influenza A by PCR NEGATIVE NEGATIVE Final   Influenza B by PCR NEGATIVE NEGATIVE Final    Comment: (NOTE) The Xpert Xpress SARS-CoV-2/FLU/RSV assay is intended as an aid in  the diagnosis of influenza from Nasopharyngeal swab specimens and  should not be used as a sole basis for treatment. Nasal washings and  aspirates are unacceptable for Xpert Xpress SARS-CoV-2/FLU/RSV  testing. Fact Sheet for Patients: PinkCheek.be Fact Sheet for Healthcare Providers: GravelBags.it This test is not yet approved or cleared by the [place] FDA and  has been authorized for detection and/or diagnosis of SARS-CoV-2 by  FDA under an Emergency Use Authorization (EUA). This EUA will remain  in effect (meaning this test can be used) for the duration of the  Covid-19 declaration under Section 564(b)(1) of the Act, 21  U.S.C. section 360bbb-3(b)(1), unless the authorization is  terminated or revoked. Performed at [facility], [address] [place], [place] [address]   Culture, blood (routine x 2)     Status: None (Preliminary result)   Collection Time: 06/01/2019  2:36 AM   Specimen: BLOOD  Result Value Ref Range Status   Specimen Description BLOOD LEFT ANTECUBITAL  Final   Special Requests   Final    BOTTLES DRAWN AEROBIC AND ANAEROBIC Blood Culture adequate volume   Culture   Final    NO GROWTH 1 DAY Performed at [facility], [address] [place], [place] [address]    Report Status PENDING  Incomplete  Culture, blood (routine x 2)     Status: None (Preliminary result)   Collection Time: 06/01/2019  2:36 AM   Specimen: BLOOD  Result Value Ref Range Status   Specimen Description BLOOD RIGHT FOREARM  Final   Special Requests   Final    BOTTLES DRAWN AEROBIC AND ANAEROBIC Blood Culture adequate volume   Culture   Final     NO GROWTH 1 DAY Performed at [facility], [address] [place], [place] [address]    Report Status PENDING  Incomplete  Culture, blood (routine x 2)     Status: None (Preliminary result)   Collection Time: 06/11/2019  3:10 PM   Specimen: BLOOD  Result Value Ref Range Status   Specimen Description BLOOD A-LINE  Final   Special Requests   Final    BOTTLES DRAWN AEROBIC AND ANAEROBIC Blood Culture results may not be optimal due to an excessive volume of blood received in culture bottles   Culture   Final    NO GROWTH < 24 HOURS Performed at [facility], [address] [place], [place] [address]    Report Status PENDING  Incomplete  Culture, blood (routine x 2)     Status: None (Preliminary result)   Collection Time: 06/14/2019  3:10  PM   Specimen: BLOOD  Result Value Ref Range Status   Specimen Description BLOOD A-LINE  Final   Special Requests   Final    BOTTLES DRAWN AEROBIC AND ANAEROBIC Blood Culture results may not be optimal due to an excessive volume of blood received in culture bottles   Culture   Final    NO GROWTH < 24 HOURS Performed at [facility], [address] [place], [place] [address]    Report Status PENDING  Incomplete      Lab Results  Component Value Date   PTH 19 07/19/2018   CALCIUM 6.6 (L) 06/03/2019   PHOS 7.9 (H) 05/30/2019               Impression: 1)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  Patient was last dialyzed yesterday on 05/21/2019 We will dialyze patient in the morning    2) hypotension Patient blood pressure is trending down patient will benefit from vasopressors  3)Anemia of chronic disease  HGb is now at  goal (9--11) Patient hemoglobin is a little low-This is most likely secondary to patient missing his Epogen dosing as  patient is nonadherent to his treatment    4) secondary hyperparathyroidism CKD Mineral-Bone Disorder  Secondary Hyperparathyroidism  present  Phosphorus is not at goal. 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    Pershing Skidmore s Theador Hawthorne 06/03/2019, 6:31 PM

## 2019-06-03 NOTE — ED Notes (Signed)
Messaged the Admit MD concerning pt's level of care and recent assessment findings by RN Katie.

## 2019-06-03 NOTE — ED Notes (Addendum)
Hospitalist Williams at bedside to assess pt. Md notified of declining changes in pt status since the beginning of the shift. Md agrees with our findings and states that pt was more alert yesterday, MD updated on pt's blood sugar, current BP 102/29. Md states that she will put in new scan orders and change pt's level of care to stepdown. Will inform charge RN greg on changes.

## 2019-06-03 NOTE — Progress Notes (Addendum)
1800 No deficits in extremities  noted when limited NIH stroke scale done. Patient has a glazed look and does try to interact unless asked a question. Exaggerated breathing effort.

## 2019-06-03 NOTE — ED Notes (Signed)
Pt stated to NT that he wanted to "get up and walk around". However, pt remains somewhat lethargic. Educated he must stay in bed so as not to risk fall. Pt agreeable currently. Bed locked low. Rails up. Call bell within reach. Lights dimmed. Door open.

## 2019-06-03 NOTE — ED Notes (Signed)
Daughter at bedside, provided update and educated on current treatments/bipap. ICU gave ok for visitor to come with pt.

## 2019-06-03 NOTE — ED Notes (Signed)
Pt wanted to call daughter, Rosemarie Ax. Pt was unable to speak in complete sentences during call so Theadora Rama RN took cellphone and spoke to daughter. Brandy RN told daughter that she could come visit pt, Rosemarie Ax stated she would be here in 15 minutes. Informed front desk staff.   Adjusted pt in bed.   Pt agreed to have bipap placed back on d/t being so SOB.   Pt repeatedly asking "Am I going to die?" informed pt that the mask will help him breath and that staff are doing everything to try to make pt feel better.

## 2019-06-03 NOTE — ED Notes (Signed)
Nephrologist at bedside to follow up with pt from this AM. Pt repeating "I don't want to die, don't let me die." reassured that pt is in good hands and he's being taken care of. Increased oxygen from 4 L to 6 L [place] d/t oxygen dropping to 60's. RT called to place pt on bipap.

## 2019-06-03 NOTE — ED Notes (Signed)
Pt ripped bipap mask off. States he doesn't like how tight it is. Pt does not want bipap mask back on. Placed on 6 L [place]. Informed pt that if he is struggling to breath he will be placed back on bipap. Pt nodded yes.

## 2019-06-03 NOTE — ED Notes (Signed)
Informed admitting MD about pt BP.

## 2019-06-03 NOTE — ED Notes (Signed)
Pt taken to CT with this RN and RT. Pt is now on bipap.

## 2019-06-03 NOTE — ED Notes (Signed)
Entered pt's room to check monitor and pulse ox readings. Pt shaking and labored breathing present. Pt oriented to place, pt not sure why he is here. Pt reoriented. Pt appears lethargic and not completely coherent. Will message Admit MD and update.

## 2019-06-03 NOTE — Consult Note (Signed)
Pharmacy Antibiotic Note  Jake Gomez is a 48 y.o. male with a medical history of type 2 diabetes mellitus, hypertension, obesity, and ESRD on HD MWF admitted on 06/08/2019 with sepsis. Patient with chills during HD session last Monday and the session was stopped mid-way. After he traveled to [place] and missed HD on [facility]. Presented to [facility] ED 12/19 with generalized malaise. He was hyperkalemic and went for HD during which he started experiencing chills. Pharmacy has been consulted for vancomycin and piperacillin/tazobactam dosing.  Patient is febrile today. Leukopenic yesterday now with leukocytosis. Thrombocytopenic- SQ heparin d/c. PCT > 150 although confounded by ESRD. There is concern for catheter related infection. Imaging concerning for left lower lobe pneumonia. MRI with multiple, acute, small infarcts bilaterally- suspected embolic etiology per chart- septic emboli not ruled out. ECHO completed today.  Per nephrology patient will not go to HD today and tentative plan for HD 12/21. Normal dialysis schedule is [facility].  Vancomycin Random  53mcg/mL (after 1 dose)- this level does not give a clear picture of VR after several doses. However, the level is still low.   Plan: Piperacillin/tazobactam 3.375 g q12h  Will order Vancomycin 1g x1 and then continue Vancomycin 1 g qHD  Continue to monitor dialysis plan, clinical need for antibiotics, and culture results.  Height: 5\' 8"  (172.7 cm) Weight: 246 lb (111.6 kg) IBW/kg (Calculated) : 68.4  Temp (24hrs), Avg:100 F (37.8 C), Min:98.8 F (37.1 C), Max:101.8 F (38.8 C)  Recent Labs  Lab 05/19/2019 0048 06/10/2019 0235 05/18/2019 0456 05/31/2019 1941 06/03/19 1136 06/03/19 1820  WBC 7.6  --  6.4 1.8* 19.6*  --   CREATININE 15.54*  --  15.46* 8.76* 10.34*  --   LATICACIDVEN  --  1.0 1.1  --   --   --   VANCORANDOM  --   --   --   --   --  5    Estimated Creatinine Clearance: 10.8 mL/min (A) (by C-G formula based on SCr of 10.34 mg/dL  (H)).    No Known Allergies  Antimicrobials this admission: Pip/tazo 12/19 >>  Vancomycin 12/19 >>   Dose adjustments this admission: N/A  Microbiology results: 12/19 BCx: Ng x <24h 12/19 BCx: Ngx 1 day 12/19 SARS-CoV-2/Influenza A/B: negative  Thank you for allowing pharmacy to be a part of this patient's care.  [place] Resident 06/03/2019 10:07 PM

## 2019-06-03 NOTE — ED Notes (Signed)
[phone] Laverna Peace, pt sister. Is going to be pt's 1 visitor per pt and daughter.

## 2019-06-03 NOTE — Progress Notes (Signed)
PT Cancellation Note  Patient Details Name: Jake Gomez MRN: [id number] DOB: 1971/05/25   Cancelled Treatment:    Reason Eval/Treat Not Completed: Medical issues which prohibited therapy   Alanson Puls,  PT DPT                    DPT 06/03/2019, 12:36 PM

## 2019-06-03 NOTE — Progress Notes (Signed)
PROGRESS NOTE    Jake Gomez  [id number] DOB: May 18, 1971 DOA: 05/22/2019 PCP: Juline Patch, MD      Assessment & Plan:   Active Problems:   Hyperkalemia   Thrombocytopenia (HCC)   CVA: etiology unclear, possible embolic vs septic embolic. Multifocal acute ischemic nonhemorrhagic infarcts involving the periventricular white matter of the corona radiata and centrum semi ovale bilaterally as per MRI brain. NPO as pt failed his speech eval. Carotid dopplers show no evidence of hemodynamically significant stenosis. Echo pending. EEG ordered. Neuro following and recs apprec.   Sepsis: etiology unclear, possibly HD catheter infection vs bacteremia vs pneumonia. Continue on IV zosyn and IV vanco. Blood cxs NGTD  Hypotension: started on IV levophed, keep MAP >65. Transfer orders for ICU. ICU attending and charge nurse aware of pt.   Pneumonia: left lower as per CTA chest. No PE. Will continue on IV zosyn & vanco. Mycoplasma, legionella, strep ordered. COVID19 neg. Started on BiPAP. Bronchodilators prn. High risk for further decompensation requiring intubation.   Possible seizure: occurred overnight. S/p IV keppra x1 as per neuro. EEG ordered. Neuro following and recs apprec  Severe hyperkalemia: resolved.  In the setting of ESRD on HD and missing 2-1/2 sessions this week. Evidently pt had a death in the family and had to travel to [place]. Educated on the importance of not missing HD, pt verbalized his understanding. Continue to hold ARB. S/p calcium gluconate, kayexalate, albuterol, insulin, phoslo & NaHCO3. HD as per nephro. Nephro following and recs apprec  Metabolic acidosis: unchanged from day prior. Management per nephro  ACD: likely secondary to ESRD. No need for a transfusion currently. Will continue to monitor   DM2: w/ current hypoglycemia. Continue on IV dextrose drip. Will continue SSI w/ accuchecks. Continue to hold home dose of glipizide    Hypertension: Hold  irbesartan, amlodipine, carvedilol, toresmide as pt is hypotensive now  Peripheral neuropathy: hold gabapentin  HLD: hold statin  Thrombocytopenia: etiology unclear, possibly secondary to sepsis. Will continue to monitor    DVT prophylaxis: SCDs only secondary to thrombocytopenia Code Status: full  Family Communication:  Disposition Plan:    Consultants:   nephro   Procedures:    Antimicrobials:    Subjective: Pt c/o malaise   Objective: Vitals:   06/03/19 0741 06/03/19 0820 06/03/19 0855 06/03/19 1053  BP: (!) 134/37 (!) 124/52  (!) 102/29  Pulse: (!) 103 (!) 105  (!) 103  Resp: (!) 23 20  (!) 25  Temp: 98.8 F (37.1 C)   99.2 F (37.3 C)  TempSrc:      SpO2:  99%  97%  Weight:   111.6 kg   Height:   5\' 8"  (1.727 m)     Intake/Output Summary (Last 24 hours) at 06/03/2019 1126 Last data filed at 05/23/2019 1830 Gross per 24 hour  Intake --  Output 1200 ml  Net -1200 ml   Filed Weights   05/29/2019 0034 06/03/19 0855  Weight: 111.6 kg 111.6 kg    Examination:  General exam: Appears calm but lethargic.  Respiratory system: decreased breath sounds b/l otherwise clear. Increased RR Cardiovascular system: S1 & S2 +. No rubs, gallops or clicks.  Gastrointestinal system: Abdomen is nondistended, soft and nontender. Normal bowel sounds heard. Central nervous system: Alert and oriented. No focal neurological deficits. Psychiatry: Judgement and insight appear normal. Flat mood and affect.     Data Reviewed: I have personally reviewed following labs and imaging studies  CBC: Recent Labs  Lab 05/18/2019 0048 05/18/2019 0456 05/24/2019 1941  WBC 7.6 6.4 1.8*  NEUTROABS 4.9  --  1.3*  HGB 9.3* 8.3* 8.6*  HCT 29.3* 26.0* 26.8*  MCV 102.4* 102.8* 101.5*  PLT 118* 106* 66*   Basic Metabolic Panel: Recent Labs  Lab 05/31/2019 0048 06/06/2019 0456 05/23/2019 1941  NA 141 143 139  K 6.4* 6.4* 4.0  CL 101 101 98  CO2 20* 21* 21*  GLUCOSE 180* 227* 108*   BUN 121* 123* 61*  CREATININE 15.54* 15.46* 8.76*  CALCIUM 7.3* 7.2* 7.8*  PHOS  --  7.9*  --    GFR: Estimated Creatinine Clearance: 12.8 mL/min (A) (by C-G formula based on SCr of 8.76 mg/dL (H)). Liver Function Tests: Recent Labs  Lab 05/23/2019 0048 05/27/2019 1941  AST 27 109*  ALT 28 71*  ALKPHOS 190* 318*  BILITOT 0.8 2.3*  PROT 8.0 7.4  ALBUMIN 4.0 3.7   No results for input(s): LIPASE, AMYLASE in the last 168 hours. No results for input(s): AMMONIA in the last 168 hours. Coagulation Profile: No results for input(s): INR, PROTIME in the last 168 hours. Cardiac Enzymes: No results for input(s): CKTOTAL, CKMB, CKMBINDEX, TROPONINI in the last 168 hours. BNP (last 3 results) No results for input(s): PROBNP in the last 8760 hours. HbA1C: No results for input(s): HGBA1C in the last 72 hours. CBG: Recent Labs  Lab 05/29/2019 2045 06/03/19 0047 06/03/19 0744 06/03/19 0922 06/03/19 1051  GLUCAP 88 104* 67* 67* 73   Lipid Profile: No results for input(s): CHOL, HDL, LDLCALC, TRIG, CHOLHDL, LDLDIRECT in the last 72 hours. Thyroid Function Tests: No results for input(s): TSH, T4TOTAL, FREET4, T3FREE, THYROIDAB in the last 72 hours. Anemia Panel: No results for input(s): VITAMINB12, FOLATE, FERRITIN, TIBC, IRON, RETICCTPCT in the last 72 hours. Sepsis Labs: Recent Labs  Lab 06/12/2019 0235 05/22/2019 0456  PROCALCITON >150.00  --   LATICACIDVEN 1.0 1.1    Recent Results (from the past 240 hour(s))  Respiratory Panel by RT PCR (Flu A&B, Covid) - Nasopharyngeal Swab     Status: None   Collection Time: 05/21/2019  2:35 AM   Specimen: Nasopharyngeal Swab  Result Value Ref Range Status   SARS Coronavirus 2 by RT PCR NEGATIVE NEGATIVE Final    Comment: (NOTE) SARS-CoV-2 target nucleic acids are NOT DETECTED. The SARS-CoV-2 RNA is generally detectable in upper respiratoy specimens during the acute phase of infection. The lowest concentration of SARS-CoV-2 viral copies this  assay can detect is 131 copies/mL. A negative result does not preclude SARS-Cov-2 infection and should not be used as the sole basis for treatment or other patient management decisions. A negative result may occur with  improper specimen collection/handling, submission of specimen other than nasopharyngeal swab, presence of viral mutation(s) within the areas targeted by this assay, and inadequate number of viral copies (<131 copies/mL). A negative result must be combined with clinical observations, patient history, and epidemiological information. The expected result is Negative. Fact Sheet for Patients:  PinkCheek.be Fact Sheet for Healthcare Providers:  GravelBags.it This test is not yet ap proved or cleared by the [place] FDA and  has been authorized for detection and/or diagnosis of SARS-CoV-2 by FDA under an Emergency Use Authorization (EUA). This EUA will remain  in effect (meaning this test can be used) for the duration of the COVID-19 declaration under Section 564(b)(1) of the Act, 21 U.S.C. section 360bbb-3(b)(1), unless the authorization is terminated or revoked sooner.    Influenza A by PCR  NEGATIVE NEGATIVE Final   Influenza B by PCR NEGATIVE NEGATIVE Final    Comment: (NOTE) The Xpert Xpress SARS-CoV-2/FLU/RSV assay is intended as an aid in  the diagnosis of influenza from Nasopharyngeal swab specimens and  should not be used as a sole basis for treatment. Nasal washings and  aspirates are unacceptable for Xpert Xpress SARS-CoV-2/FLU/RSV  testing. Fact Sheet for Patients: PinkCheek.be Fact Sheet for Healthcare Providers: GravelBags.it This test is not yet approved or cleared by the [place] FDA and  has been authorized for detection and/or diagnosis of SARS-CoV-2 by  FDA under an Emergency Use Authorization (EUA). This EUA will remain  in  effect (meaning this test can be used) for the duration of the  Covid-19 declaration under Section 564(b)(1) of the Act, 21  U.S.C. section 360bbb-3(b)(1), unless the authorization is  terminated or revoked. Performed at [facility], [address] [place], [place] [address]   Culture, blood (routine x 2)     Status: None (Preliminary result)   Collection Time: 05/25/2019  2:36 AM   Specimen: BLOOD  Result Value Ref Range Status   Specimen Description BLOOD LEFT ANTECUBITAL  Final   Special Requests   Final    BOTTLES DRAWN AEROBIC AND ANAEROBIC Blood Culture adequate volume   Culture   Final    NO GROWTH 1 DAY Performed at [facility], [address] [place], [place] [address]    Report Status PENDING  Incomplete  Culture, blood (routine x 2)     Status: None (Preliminary result)   Collection Time: 05/16/2019  2:36 AM   Specimen: BLOOD  Result Value Ref Range Status   Specimen Description BLOOD RIGHT FOREARM  Final   Special Requests   Final    BOTTLES DRAWN AEROBIC AND ANAEROBIC Blood Culture adequate volume   Culture   Final    NO GROWTH 1 DAY Performed at [facility], [address] [place], [place] [address]    Report Status PENDING  Incomplete  Culture, blood (routine x 2)     Status: None (Preliminary result)   Collection Time: 06/01/2019  3:10 PM   Specimen: BLOOD  Result Value Ref Range Status   Specimen Description BLOOD A-LINE  Final   Special Requests   Final    BOTTLES DRAWN AEROBIC AND ANAEROBIC Blood Culture results may not be optimal due to an excessive volume of blood received in culture bottles   Culture   Final    NO GROWTH < 24 HOURS Performed at [facility], [address], [place], [place] [address]    Report Status PENDING  Incomplete  Culture, blood (routine x 2)     Status: None (Preliminary result)   Collection Time: 05/29/2019  3:10 PM   Specimen: BLOOD  Result Value Ref Range Status   Specimen  Description BLOOD A-LINE  Final   Special Requests   Final    BOTTLES DRAWN AEROBIC AND ANAEROBIC Blood Culture results may not be optimal due to an excessive volume of blood received in culture bottles   Culture   Final    NO GROWTH < 24 HOURS Performed at [facility], [address] [place], [place] [address]    Report Status PENDING  Incomplete         Radiology Studies: DG Chest 1 View  Result Date: 06/01/2019 CLINICAL DATA:  Chest pain.  Missed dialysis. EXAM: CHEST  1 VIEW COMPARISON:  08/16/2018 FINDINGS: Right-sided dialysis catheter tip  at the atrial caval junction. Unchanged cardiomegaly. Left pleural effusion versus pleural thickening and scarring, unchanged from prior exam. Overall low lung volumes. Probable vascular congestion. No pneumothorax. IMPRESSION: Low lung volumes. Cardiomegaly with probable vascular congestion. Chronic left pleural effusion/thickening and basilar scarring. Electronically Signed   By: Keith Rake M.D.   On: 05/27/2019 02:40   CT Head Wo Contrast  Result Date: 06/11/2019 CLINICAL DATA:  48 year old male with syncope. Patient with end-stage renal disease on dialysis. EXAM: CT HEAD WITHOUT CONTRAST TECHNIQUE: Contiguous axial images were obtained from the base of the skull through the vertex without intravenous contrast. COMPARISON:  11/17/2010 CT FINDINGS: Brain: No evidence of acute infarction, hemorrhage, hydrocephalus, extra-axial collection or mass lesion/mass effect. Vascular: Mild carotid atherosclerotic calcifications noted. Skull: Normal. Negative for fracture or focal lesion. Sinuses/Orbits: No acute finding. Other: None. IMPRESSION: No evidence of acute intracranial abnormality. Electronically Signed   By: Margarette Canada M.D.   On: 05/15/2019 20:20   MR ANGIO HEAD WO CONTRAST  Result Date: 06/03/2019 CLINICAL DATA:  Initial evaluation for acute altered mental status. EXAM: MRI HEAD WITHOUT CONTRAST MRA HEAD WITHOUT CONTRAST  TECHNIQUE: Multiplanar, multiecho pulse sequences of the brain and surrounding structures were obtained without intravenous contrast. Angiographic images of the head were obtained using MRA technique without contrast. COMPARISON:  Prior CT from 05/21/2019. FINDINGS: MRI HEAD FINDINGS Brain: Examination moderately degraded by motion artifact. Cerebral volume within normal limits for age. Patchy multifocal areas of restricted diffusion seen involving the periventricular white matter of the corona radiata and centrum semi ovale bilaterally, right slightly worse than left. Largest area of infarction seen on the right and measures up to 2.1 cm. No associated hemorrhage or mass effect. No other evidence for acute or subacute ischemia. Gray-white matter differentiation otherwise maintained. No encephalomalacia to suggest chronic cortical infarction. 8 mm focus of susceptibility artifact noted at the periventricular white matter of the right centrum semi ovale, consistent with a chronic microhemorrhage. No mass lesion, midline shift or mass effect. No hydrocephalus. No extra-axial fluid collection. No obvious intrinsic temporal lobe abnormality. Pituitary gland suprasellar region normal. Midline structures intact. Vascular: Major intracranial vascular flow voids grossly maintained at the skull base. Skull and upper cervical spine: Craniocervical junction normal. Upper cervical spine within normal limits. Bone marrow signal intensity normal. No scalp soft tissue abnormality. Sinuses/Orbits: Globes and orbital soft tissues within normal limits. Paranasal sinuses are largely clear. No significant mastoid effusion. Inner ear structures grossly normal. Other: None. MRA HEAD FINDINGS ANTERIOR CIRCULATION: Examination moderately degraded by motion artifact. Visualized distal cervical segments of the internal carotid arteries are widely patent with symmetric antegrade flow. Petrous, cavernous, and supraclinoid ICAs widely patent  without appreciable flow-limiting stenosis. A1 segments patent bilaterally. Normal anterior communicating artery. Anterior cerebral arteries markedly limited assessment due to motion, but are grossly patent to their distal aspects. No M1 stenosis or occlusion. Negative MCA bifurcations. Distal MCA branches markedly limited in assessment due to motion, but are grossly perfused and symmetric. POSTERIOR CIRCULATION: Vertebral arteries patent to the vertebrobasilar junction without stenosis. Left vertebral artery dominant. Patent right PICA. Left PICA not seen. Basilar widely patent to its distal aspect without stenosis. Superior cerebral arteries patent bilaterally. Right PCA primarily supplied via the basilar. Fetal type origin left PCA. Both PCAs well perfused to their distal aspects without stenosis. No appreciable aneurysm or other vascular abnormality. IMPRESSION: MRI HEAD IMPRESSION: 1. Motion degraded exam. 2. Patchy multifocal acute ischemic nonhemorrhagic infarcts involving the periventricular white matter of  the corona radiata and centrum semi ovale bilaterally. No significant mass effect. 3. Otherwise grossly normal brain MRI for age. MRA HEAD IMPRESSION: 1. Technically limited exam due to motion. 2. Grossly negative intracranial MRA. No large vessel occlusion, hemodynamically significant stenosis, or other acute vascular abnormality. Electronically Signed   By: Jeannine Boga M.D.   On: 06/03/2019 00:51   MR BRAIN WO CONTRAST  Result Date: 06/03/2019 CLINICAL DATA:  Initial evaluation for acute altered mental status. EXAM: MRI HEAD WITHOUT CONTRAST MRA HEAD WITHOUT CONTRAST TECHNIQUE: Multiplanar, multiecho pulse sequences of the brain and surrounding structures were obtained without intravenous contrast. Angiographic images of the head were obtained using MRA technique without contrast. COMPARISON:  Prior CT from 05/23/2019. FINDINGS: MRI HEAD FINDINGS Brain: Examination moderately degraded by  motion artifact. Cerebral volume within normal limits for age. Patchy multifocal areas of restricted diffusion seen involving the periventricular white matter of the corona radiata and centrum semi ovale bilaterally, right slightly worse than left. Largest area of infarction seen on the right and measures up to 2.1 cm. No associated hemorrhage or mass effect. No other evidence for acute or subacute ischemia. Gray-white matter differentiation otherwise maintained. No encephalomalacia to suggest chronic cortical infarction. 8 mm focus of susceptibility artifact noted at the periventricular white matter of the right centrum semi ovale, consistent with a chronic microhemorrhage. No mass lesion, midline shift or mass effect. No hydrocephalus. No extra-axial fluid collection. No obvious intrinsic temporal lobe abnormality. Pituitary gland suprasellar region normal. Midline structures intact. Vascular: Major intracranial vascular flow voids grossly maintained at the skull base. Skull and upper cervical spine: Craniocervical junction normal. Upper cervical spine within normal limits. Bone marrow signal intensity normal. No scalp soft tissue abnormality. Sinuses/Orbits: Globes and orbital soft tissues within normal limits. Paranasal sinuses are largely clear. No significant mastoid effusion. Inner ear structures grossly normal. Other: None. MRA HEAD FINDINGS ANTERIOR CIRCULATION: Examination moderately degraded by motion artifact. Visualized distal cervical segments of the internal carotid arteries are widely patent with symmetric antegrade flow. Petrous, cavernous, and supraclinoid ICAs widely patent without appreciable flow-limiting stenosis. A1 segments patent bilaterally. Normal anterior communicating artery. Anterior cerebral arteries markedly limited assessment due to motion, but are grossly patent to their distal aspects. No M1 stenosis or occlusion. Negative MCA bifurcations. Distal MCA branches markedly limited in  assessment due to motion, but are grossly perfused and symmetric. POSTERIOR CIRCULATION: Vertebral arteries patent to the vertebrobasilar junction without stenosis. Left vertebral artery dominant. Patent right PICA. Left PICA not seen. Basilar widely patent to its distal aspect without stenosis. Superior cerebral arteries patent bilaterally. Right PCA primarily supplied via the basilar. Fetal type origin left PCA. Both PCAs well perfused to their distal aspects without stenosis. No appreciable aneurysm or other vascular abnormality. IMPRESSION: MRI HEAD IMPRESSION: 1. Motion degraded exam. 2. Patchy multifocal acute ischemic nonhemorrhagic infarcts involving the periventricular white matter of the corona radiata and centrum semi ovale bilaterally. No significant mass effect. 3. Otherwise grossly normal brain MRI for age. MRA HEAD IMPRESSION: 1. Technically limited exam due to motion. 2. Grossly negative intracranial MRA. No large vessel occlusion, hemodynamically significant stenosis, or other acute vascular abnormality. Electronically Signed   By: Jeannine Boga M.D.   On: 06/03/2019 00:51   US Carotid Bilateral (at [facility] only)  Result Date: 06/03/2019 CLINICAL DATA:  48 year old male with acute altered mental status, possible stroke EXAM: BILATERAL CAROTID DUPLEX ULTRASOUND TECHNIQUE: Pearline Cables scale imaging, color Doppler and duplex ultrasound were performed of bilateral  carotid and vertebral arteries in the neck. COMPARISON:  Brain MRI 06/03/2019 FINDINGS: Criteria: Quantification of carotid stenosis is based on velocity parameters that correlate the residual internal carotid diameter with NASCET-based stenosis levels, using the diameter of the distal internal carotid lumen as the denominator for stenosis measurement. The following velocity measurements were obtained: RIGHT ICA: 103/36 cm/sec CCA: [id number] cm/sec SYSTOLIC ICA/CCA RATIO:  1.1 ECA:  171 cm/sec LEFT ICA: 79/24 cm/sec CCA: [id number] cm/sec  SYSTOLIC ICA/CCA RATIO:  0.6 ECA:  106 cm/sec RIGHT CAROTID ARTERY: Mild heterogeneous atherosclerotic plaque in the proximal internal carotid artery. By peak systolic velocity criteria, the estimated stenosis remains less than 50%. RIGHT VERTEBRAL ARTERY:  Patent with normal antegrade flow. LEFT CAROTID ARTERY: No significant atherosclerotic plaque or evidence of stenosis in the internal carotid artery. LEFT VERTEBRAL ARTERY:  Patent with normal antegrade flow. IMPRESSION: Mild (1-49%) stenosis proximal right internal carotid artery secondary to heterogenous atherosclerotic plaque. No significant atherosclerotic plaque or evidence of stenosis in the left internal carotid artery. The vertebral arteries are patent with normal antegrade flow. Electronically Signed   By: Jacqulynn Cadet M.D.   On: 06/03/2019 10:40   DG Chest Portable 1 View  Result Date: 05/23/2019 CLINICAL DATA:  Loss of consciousness EXAM: PORTABLE CHEST 1 VIEW COMPARISON:  June 02, 2019 FINDINGS: Again noted is mild cardiomegaly. A right-sided central venous catheter seen with the tip at the superior cavoatrial junction. There is prominence of the pulmonary vasculature. There is mildly increased hazy airspace opacity seen at the left lung base with pleural thickening. No acute osseous abnormality. 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 IMPRESSION: Pulmonary vascular congestion and mild cardiomegaly. Unchanged chronic hazy airspace opacity at the left lung base/pleural thickening. Electronically Signed   By: Prudencio Pair M.D.   On: 05/28/2019 20:04        Scheduled Meds: .  stroke: mapping our early stages of recovery book   Does not apply Once  . amLODipine  10 mg Oral Daily  . amLODipine  5 mg Oral QHS  . aspirin  300 mg Rectal Daily   Or  . aspirin EC  325 mg Oral Daily  . atorvastatin  80 mg Oral q morning - 10a  . calcium acetate  2,001 mg Oral TID WC  . carvedilol  12.5 mg Oral BID WC  . Chlorhexidine Gluconate Cloth   6 each Topical Q0600  . gabapentin  300 mg Oral Daily  . heparin  5,000 Units Subcutaneous Q8H  . hydrALAZINE  25 mg Oral TID  . insulin aspart  0-9 Units Subcutaneous TID WC  . torsemide  100 mg Oral Daily   Continuous Infusions: . alteplase (TPA-ACTIVASE) *DIALYSIS CATH* 5 mg infusion    . alteplase (TPA-ACTIVASE) *DIALYSIS CATH* 5 mg infusion    . alteplase (TPA-ACTIVASE) *DIALYSIS CATH* 5 mg infusion    . alteplase (TPA-ACTIVASE) *DIALYSIS CATH* 5 mg infusion    . alteplase (TPA-ACTIVASE) *DIALYSIS CATH* 5 mg infusion    . alteplase (TPA-ACTIVASE) *DIALYSIS CATH* 5 mg infusion    . dextrose 5 mL/hr at 06/03/19 0810  . piperacillin-tazobactam (ZOSYN)  IV 3.375 g (06/03/19 1045)     LOS: 1 day    Time spent: 35 mins    Wyvonnia Dusky, MD [facility] Pager [phone]  If 7PM-7AM, please contact night-coverage [email or web address] Password [email or web address] 06/03/2019, 11:26 AM

## 2019-06-03 NOTE — ED Notes (Signed)
Having to redirect pt to keep bipap mask on. Pt keeps asking why he has to wear it (speech is not great, mostly guessing game by this RN to figure out what pt is asking). Pt will state that he comprehends but will pull at mask every 20-30 minutes.

## 2019-06-03 NOTE — ED Notes (Signed)
Pt attempting to write but writing is unintelligible, pt is able to talk but finds it difficult with bipap.

## 2019-06-03 NOTE — ED Notes (Signed)
Report given to Kate, RN

## 2019-06-04 ENCOUNTER — Inpatient Hospital Stay: Payer: Medicare Other

## 2019-06-04 ENCOUNTER — Encounter: Payer: Self-pay | Admitting: Internal Medicine

## 2019-06-04 ENCOUNTER — Encounter: Admission: EM | Disposition: E | Payer: Self-pay | Source: Home / Self Care | Attending: Internal Medicine

## 2019-06-04 DIAGNOSIS — Z0189 Encounter for other specified special examinations: Secondary | ICD-10-CM

## 2019-06-04 DIAGNOSIS — N186 End stage renal disease: Secondary | ICD-10-CM

## 2019-06-04 DIAGNOSIS — Z992 Dependence on renal dialysis: Secondary | ICD-10-CM

## 2019-06-04 DIAGNOSIS — J181 Lobar pneumonia, unspecified organism: Secondary | ICD-10-CM

## 2019-06-04 DIAGNOSIS — R079 Chest pain, unspecified: Secondary | ICD-10-CM

## 2019-06-04 DIAGNOSIS — T82898A Other specified complication of vascular prosthetic devices, implants and grafts, initial encounter: Secondary | ICD-10-CM

## 2019-06-04 DIAGNOSIS — Z01818 Encounter for other preprocedural examination: Secondary | ICD-10-CM

## 2019-06-04 DIAGNOSIS — J9601 Acute respiratory failure with hypoxia: Secondary | ICD-10-CM

## 2019-06-04 DIAGNOSIS — E875 Hyperkalemia: Secondary | ICD-10-CM

## 2019-06-04 HISTORY — PX: DIALYSIS/PERMA CATHETER REMOVAL: CATH118289

## 2019-06-04 LAB — GLUCOSE, CAPILLARY
Glucose-Capillary: 104 mg/dL — ABNORMAL HIGH (ref 70–99)
Glucose-Capillary: 124 mg/dL — ABNORMAL HIGH (ref 70–99)
Glucose-Capillary: 124 mg/dL — ABNORMAL HIGH (ref 70–99)
Glucose-Capillary: 116 mg/dL — ABNORMAL HIGH (ref 70–99)
Glucose-Capillary: 113 mg/dL — ABNORMAL HIGH (ref 70–99)
Glucose-Capillary: 111 mg/dL — ABNORMAL HIGH (ref 70–99)

## 2019-06-04 LAB — LIPID PANEL
Cholesterol: 101 mg/dL (ref 0–200)
HDL: <10 mg/dL — ABNORMAL LOW (ref >40)
LDL Cholesterol: UNDETERMINED mg/dL (ref 0–99)
Total CHOL/HDL Ratio: UNDETERMINED RATIO
Triglycerides: 595 mg/dL — ABNORMAL HIGH (ref <150)
VLDL: UNDETERMINED mg/dL (ref 0–40)

## 2019-06-04 LAB — ROCKY MTN SPOTTED FVR ABS PNL(IGG+IGM)
RMSF IgG: NEGATIVE
RMSF IgM: 0.17 index (ref 0.00–0.89)

## 2019-06-04 LAB — RENAL FUNCTION PANEL
Albumin: 2.9 g/dL — ABNORMAL LOW (ref 3.5–5.0)
Anion gap: 20 — ABNORMAL HIGH (ref 5–15)
BUN: 100 mg/dL — ABNORMAL HIGH (ref 6–20)
CO2: 21 mmol/L — ABNORMAL LOW (ref 22–32)
Calcium: 6.5 mg/dL — ABNORMAL LOW (ref 8.9–10.3)
Chloride: 95 mmol/L — ABNORMAL LOW (ref 98–111)
Creatinine, Ser: 11.7 mg/dL — ABNORMAL HIGH (ref 0.61–1.24)
GFR calc Af Amer: 5 mL/min — ABNORMAL LOW (ref >60)
GFR calc non Af Amer: 5 mL/min — ABNORMAL LOW (ref >60)
Glucose, Bld: 113 mg/dL — ABNORMAL HIGH (ref 70–99)
Phosphorus: 8.1 mg/dL — ABNORMAL HIGH (ref 2.5–4.6)
Potassium: 5.5 mmol/L — ABNORMAL HIGH (ref 3.5–5.1)
Sodium: 136 mmol/L (ref 135–145)

## 2019-06-04 LAB — BLOOD GAS, ARTERIAL
Acid-base deficit: 3.2 mmol/L — ABNORMAL HIGH (ref 0.0–2.0)
Allens test (pass/fail): POSITIVE — AB
Bicarbonate: 23.6 mmol/L (ref 20.0–28.0)
FIO2: 0.35
MECHVT: 500 mL
O2 Saturation: 96 %
PEEP: 5 cmH2O
Patient temperature: 37
RATE: 16 resp/min
pCO2 arterial: 48 mmHg (ref 32.0–48.0)
pH, Arterial: 7.3 — ABNORMAL LOW (ref 7.350–7.450)
pO2, Arterial: 90 mmHg (ref 83.0–108.0)

## 2019-06-04 LAB — HSV(HERPES SIMPLEX VRS) I + II AB-IGG
HSV 1 Glycoprotein G Ab, IgG: 21.8 index — ABNORMAL HIGH (ref 0.00–0.90)
HSV 2 Glycoprotein G Ab, IgG: 5.67 index — ABNORMAL HIGH (ref 0.00–0.90)

## 2019-06-04 LAB — LDL CHOLESTEROL, DIRECT: Direct LDL: 6.7 mg/dL (ref 0–99)

## 2019-06-04 LAB — CBC
HCT: 27.3 % — ABNORMAL LOW (ref 39.0–52.0)
Hemoglobin: 8.6 g/dL — ABNORMAL LOW (ref 13.0–17.0)
MCH: 32.2 pg (ref 26.0–34.0)
MCHC: 31.5 g/dL (ref 30.0–36.0)
MCV: 102.2 fL — ABNORMAL HIGH (ref 80.0–100.0)
Platelets: 58 10*3/uL — ABNORMAL LOW (ref 150–400)
RBC: 2.67 MIL/uL — ABNORMAL LOW (ref 4.22–5.81)
RDW: 14.7 % (ref 11.5–15.5)
WBC: 33 10*3/uL — ABNORMAL HIGH (ref 4.0–10.5)
nRBC: 0 % (ref 0.0–0.2)

## 2019-06-04 LAB — BASIC METABOLIC PANEL
Anion gap: 17 — ABNORMAL HIGH (ref 5–15)
BUN: 93 mg/dL — ABNORMAL HIGH (ref 6–20)
CO2: 24 mmol/L (ref 22–32)
Calcium: 6.2 mg/dL — CL (ref 8.9–10.3)
Chloride: 96 mmol/L — ABNORMAL LOW (ref 98–111)
Creatinine, Ser: 12.14 mg/dL — ABNORMAL HIGH (ref 0.61–1.24)
GFR calc Af Amer: 5 mL/min — ABNORMAL LOW (ref >60)
GFR calc non Af Amer: 4 mL/min — ABNORMAL LOW (ref >60)
Glucose, Bld: 142 mg/dL — ABNORMAL HIGH (ref 70–99)
Potassium: 6.1 mmol/L — ABNORMAL HIGH (ref 3.5–5.1)
Sodium: 137 mmol/L (ref 135–145)

## 2019-06-04 LAB — HEMOGLOBIN A1C
Hgb A1c MFr Bld: 6.4 % — ABNORMAL HIGH (ref 4.8–5.6)
Mean Plasma Glucose: 136.98 mg/dL

## 2019-06-04 LAB — TRIGLYCERIDES: Triglycerides: 715 mg/dL — ABNORMAL HIGH (ref <150)

## 2019-06-04 LAB — STREP PNEUMONIAE URINARY ANTIGEN: Strep Pneumo Urinary Antigen: NEGATIVE

## 2019-06-04 SURGERY — DIALYSIS/PERMA CATHETER REMOVAL
Anesthesia: LOCAL

## 2019-06-04 MED ORDER — NOREPINEPHRINE 16 MG/250ML-% IV SOLN
0.0000 ug/min | INTRAVENOUS | Status: DC
  Administered 2019-06-04: 5 ug/min via INTRAVENOUS
  Filled 2019-06-04 (×2): qty 250

## 2019-06-04 MED ORDER — FENTANYL CITRATE (PF) 100 MCG/2ML IJ SOLN: 400.0000 ug | Freq: Once | INTRAMUSCULAR | Status: AC

## 2019-06-04 MED ORDER — MIDAZOLAM HCL 2 MG/2ML IJ SOLN
2.0000 mg | Freq: Once | INTRAMUSCULAR | Status: AC
  Administered 2019-06-04: 2 mg via INTRAVENOUS
  Filled 2019-06-04: qty 2

## 2019-06-04 MED ORDER — PIPERACILLIN-TAZOBACTAM 3.375 G IVPB: 3.3750 g | Freq: Four times a day (QID) | INTRAVENOUS | Status: DC

## 2019-06-04 MED ORDER — CHLORHEXIDINE GLUCONATE 0.12% ORAL RINSE (MEDLINE KIT)
15.0000 mL | Freq: Two times a day (BID) | OROMUCOSAL | Status: DC
  Administered 2019-06-04 – 2019-06-25 (×42): 15 mL via OROMUCOSAL

## 2019-06-04 MED ORDER — CALCIUM GLUCONATE-NACL 2-0.675 GM/100ML-% IV SOLN
2.0000 g | Freq: Once | INTRAVENOUS | Status: AC
  Administered 2019-06-04: 2000 mg via INTRAVENOUS
  Filled 2019-06-04: qty 100

## 2019-06-04 MED ORDER — PRISMASOL BGK 0/2.5 32-2.5 MEQ/L IV SOLN
INTRAVENOUS | Status: DC
  Filled 2019-06-04 (×30): qty 5000

## 2019-06-04 MED ORDER — ACETAMINOPHEN 650 MG RE SUPP: 650.0000 mg | Freq: Four times a day (QID) | RECTAL | Status: DC | PRN

## 2019-06-04 MED ORDER — SODIUM CHLORIDE 0.9 % FOR CRRT
INTRAVENOUS_CENTRAL | Status: DC | PRN
  Filled 2019-06-04: qty 1000

## 2019-06-04 MED ORDER — VANCOMYCIN HCL IN DEXTROSE 1-5 GM/200ML-% IV SOLN
1000.0000 mg | INTRAVENOUS | Status: DC
  Administered 2019-06-04 – 2019-06-05 (×2): 1000 mg via INTRAVENOUS
  Filled 2019-06-04 (×3): qty 200

## 2019-06-04 MED ORDER — ATORVASTATIN CALCIUM 20 MG PO TABS
80.0000 mg | ORAL_TABLET | Freq: Every morning | ORAL | Status: DC
  Administered 2019-06-04 – 2019-06-24 (×20): 80 mg
  Filled 2019-06-04 (×20): qty 4

## 2019-06-04 MED ORDER — HEPARIN SODIUM (PORCINE) 1000 UNIT/ML DIALYSIS: 1000.0000 [IU] | INTRAMUSCULAR | Status: DC | PRN

## 2019-06-04 MED ORDER — SODIUM CHLORIDE 0.9% FLUSH: 10.0000 mL | INTRAVENOUS | Status: DC | PRN

## 2019-06-04 MED ORDER — ASPIRIN 81 MG PO CHEW
324.0000 mg | CHEWABLE_TABLET | Freq: Every day | ORAL | Status: DC
  Administered 2019-06-04 – 2019-06-07 (×4): 324 mg
  Administered 2019-06-08: 81 mg
  Administered 2019-06-09 – 2019-06-25 (×16): 324 mg
  Filled 2019-06-04 (×22): qty 4

## 2019-06-04 MED ORDER — SODIUM CHLORIDE 0.9% FLUSH
10.0000 mL | Freq: Two times a day (BID) | INTRAVENOUS | Status: DC
  Administered 2019-06-04 – 2019-06-15 (×22): 10 mL
  Administered 2019-06-16: 30 mL
  Administered 2019-06-16 – 2019-06-21 (×9): 10 mL

## 2019-06-04 MED ORDER — PRISMASOL BGK 0/2.5 32-2.5 MEQ/L IV SOLN
INTRAVENOUS | Status: DC
  Filled 2019-06-04 (×5): qty 5000

## 2019-06-04 MED ORDER — PROPOFOL 1000 MG/100ML IV EMUL
5.0000 ug/kg/min | INTRAVENOUS | Status: DC
  Administered 2019-06-04: 15 ug/kg/min via INTRAVENOUS
  Administered 2019-06-05: 25 ug/kg/min via INTRAVENOUS
  Filled 2019-06-04 (×2): qty 100

## 2019-06-04 MED ORDER — ONDANSETRON HCL 4 MG/2ML IJ SOLN: 4.0000 mg | Freq: Four times a day (QID) | INTRAMUSCULAR | Status: DC | PRN

## 2019-06-04 MED ORDER — FENTANYL CITRATE (PF) 100 MCG/2ML IJ SOLN
INTRAMUSCULAR | Status: AC
  Administered 2019-06-04: 200 ug via INTRAVENOUS
  Filled 2019-06-04: qty 8

## 2019-06-04 MED ORDER — PRISMASOL BGK 0/2.5 32-2.5 MEQ/L IV SOLN
INTRAVENOUS | Status: DC
  Filled 2019-06-04: qty 5000

## 2019-06-04 MED ORDER — HEPARIN SODIUM (PORCINE) 1000 UNIT/ML DIALYSIS
1000.0000 [IU] | INTRAMUSCULAR | Status: DC | PRN
  Filled 2019-06-04: qty 6

## 2019-06-04 MED ORDER — MIDAZOLAM HCL 2 MG/2ML IJ SOLN
2.0000 mg | INTRAMUSCULAR | Status: DC | PRN
  Administered 2019-06-04 – 2019-06-15 (×12): 2 mg via INTRAVENOUS
  Filled 2019-06-04 (×15): qty 2

## 2019-06-04 MED ORDER — PIPERACILLIN-TAZOBACTAM 3.375 G IVPB 30 MIN
3.3750 g | Freq: Four times a day (QID) | INTRAVENOUS | Status: DC
  Administered 2019-06-04 – 2019-06-06 (×7): 3.375 g via INTRAVENOUS
  Filled 2019-06-04 (×17): qty 50

## 2019-06-04 MED ORDER — VECURONIUM BROMIDE 10 MG IV SOLR
10.0000 mg | INTRAVENOUS | Status: DC | PRN
  Administered 2019-06-08 – 2019-06-19 (×4): 10 mg via INTRAVENOUS
  Filled 2019-06-04 (×5): qty 10

## 2019-06-04 MED ORDER — VECURONIUM BROMIDE 10 MG IV SOLR
10.0000 mg | Freq: Once | INTRAVENOUS | Status: AC
  Administered 2019-06-04: 10 mg via INTRAVENOUS
  Filled 2019-06-04: qty 10

## 2019-06-04 MED ORDER — TRAZODONE HCL 50 MG PO TABS: 25.0000 mg | ORAL_TABLET | Freq: Every evening | ORAL | Status: DC | PRN

## 2019-06-04 MED ORDER — ONDANSETRON HCL 4 MG PO TABS: 4.0000 mg | ORAL_TABLET | Freq: Four times a day (QID) | ORAL | Status: DC | PRN

## 2019-06-04 MED ORDER — ORAL CARE MOUTH RINSE
15.0000 mL | OROMUCOSAL | Status: DC
  Administered 2019-06-04 – 2019-06-25 (×201): 15 mL via OROMUCOSAL

## 2019-06-04 MED ORDER — ACETAMINOPHEN 325 MG PO TABS: 650.0000 mg | ORAL_TABLET | Freq: Four times a day (QID) | ORAL | Status: DC | PRN

## 2019-06-04 MED ORDER — FENTANYL 2500MCG IN NS 250ML (10MCG/ML) PREMIX INFUSION
0.0000 ug/h | INTRAVENOUS | Status: DC
  Administered 2019-06-04: 25 ug/h via INTRAVENOUS
  Administered 2019-06-04 – 2019-06-05 (×3): 225 ug/h via INTRAVENOUS
  Administered 2019-06-06 – 2019-06-07 (×2): 100 ug/h via INTRAVENOUS
  Administered 2019-06-08 (×2): 175 ug/h via INTRAVENOUS
  Administered 2019-06-09 – 2019-06-10 (×3): 250 ug/h via INTRAVENOUS
  Administered 2019-06-10: 225 ug/h via INTRAVENOUS
  Administered 2019-06-10: 250 ug/h via INTRAVENOUS
  Administered 2019-06-11: 200 ug/h via INTRAVENOUS
  Administered 2019-06-12: 250 ug/h via INTRAVENOUS
  Administered 2019-06-12: 300 ug/h via INTRAVENOUS
  Administered 2019-06-13 – 2019-06-14 (×4): 275 ug/h via INTRAVENOUS
  Administered 2019-06-15: 250 ug/h via INTRAVENOUS
  Administered 2019-06-15 – 2019-06-16 (×2): 275 ug/h via INTRAVENOUS
  Administered 2019-06-16 (×2): 350 ug/h via INTRAVENOUS
  Administered 2019-06-17: 300 ug/h via INTRAVENOUS
  Administered 2019-06-17: 350 ug/h via INTRAVENOUS
  Administered 2019-06-17 – 2019-06-18 (×5): 300 ug/h via INTRAVENOUS
  Administered 2019-06-19: 400 ug/h via INTRAVENOUS
  Administered 2019-06-19: 150 ug/h via INTRAVENOUS
  Administered 2019-06-20 – 2019-06-21 (×6): 300 ug/h via INTRAVENOUS
  Administered 2019-06-22 (×2): 200 ug/h via INTRAVENOUS
  Administered 2019-06-23 (×2): 225 ug/h via INTRAVENOUS
  Administered 2019-06-24: 350 ug/h via INTRAVENOUS
  Administered 2019-06-24: 245 ug/h via INTRAVENOUS
  Administered 2019-06-24 – 2019-06-25 (×2): 350 ug/h via INTRAVENOUS
  Filled 2019-06-04 (×51): qty 250

## 2019-06-04 SURGICAL SUPPLY — 2 items
KIT DIALYSIS CATH TRI 30X13 (CATHETERS) ×2 IMPLANT
TRAY LACERAT/PLASTIC (MISCELLANEOUS) ×2 IMPLANT

## 2019-06-04 NOTE — Progress Notes (Signed)
After discussing patient in rounds and no improvements noted with being on BiPAP Dr. Mortimer Fries ordered for patient to be intubated.  Dr. Mortimer Fries spoke with daughter over phone about patient and his decline.

## 2019-06-04 NOTE — Procedures (Signed)
Nurse denied access to pt at this time-pt has to have emergent dialysis and new catheter placed asap

## 2019-06-04 NOTE — Progress Notes (Signed)
Abdominal x-ray recommended orogastric tube to be advanced.  Orogastric tube advanced from 44cm at lip to 60cm at lip.  Audible air injection noted to verify placement.  Dr. Mortimer Fries notified of advancement and verbalized no need to repeat abdominal x-ray and orogastric tube ok to use.

## 2019-06-04 NOTE — Consult Note (Signed)
Name: Jake Gomez MRN: [id number] DOB: [birth year]     CONSULTATION DATE: 05/17/2019  REFERRING MD :  williams  CHIEF COMPLAINT:  Resp distress   HISTORY OF PRESENT ILLNESS:   48 y.o. African-American male with a known history of 2 diabetes mellitus, hypertension and obstructive sleep apnea not on CPAP, as well as end-stage renal disease on hemodialysis on Monday Wednesday and Friday.  The patient has been having chills during his hemodialysis session on Monday and he stopped halfway the session.  He then went to [place] and missed hemodialysis on Wednesday and Friday.  He came to the ER today with generalized malaise.    Currently he is critically ill acute hypoxic resp failure On biPAP Previous COVID 19 infection +aspirtauion pneumonia in setting of CVA  High risk for intubation and cardiac arrest MRI c/w ischemic infarcts   PAST MEDICAL HISTORY :   has a past medical history of Diabetes mellitus without complication ([place]), Hypertension, Renal disorder, and Sleep apnea.  has a past surgical history that includes peritonal dialysis; Removal of a dialysis catheter (N/A, 07/17/2018); DIALYSIS/PERMA CATHETER INSERTION (N/A, 07/19/2018); DIALYSIS/PERMA CATHETER INSERTION (N/A, 09/13/2018); and AV fistula placement (Left, 01/31/2019). Prior to Admission medications   Medication Sig Start Date End Date Taking? Authorizing Provider  acetaminophen (TYLENOL) 325 MG tablet Take 1,000 mg by mouth every 6 (six) hours as needed for mild pain.   Yes [provider]  amLODipine (NORVASC) 10 MG tablet Take 1 tablet (10 mg total) by mouth daily. Patient taking differently: Take 10 mg by mouth daily. 10mg  at night and 5mg  in the am- nephrology 07/23/18  Yes Stark Jock, Jude, MD  atorvastatin (LIPITOR) 80 MG tablet Take 1 tablet (80 mg total) by mouth every morning. 10/17/18  Yes Juline Patch, MD  carvedilol (COREG) 12.5 MG tablet Take 1 tablet (12.5 mg total) by mouth 2 (two) times daily with a meal.  10/17/18  Yes Juline Patch, MD  folic acid (FOLVITE) 1 MG tablet Take 1 tablet (1 mg total) by mouth daily. 05/06/18  Yes Vaughan Basta, MD  gabapentin (NEURONTIN) 300 MG capsule Take 300 mg by mouth daily.   Yes [provider]  glipiZIDE (GLUCOTROL) 5 MG tablet Take 1 tablet (5 mg total) by mouth 2 (two) times daily before a meal. 10/17/18  Yes Juline Patch, MD  irbesartan (AVAPRO) 300 MG tablet Take 300 mg by mouth at bedtime.  05/14/17  Yes [provider]  torsemide (DEMADEX) 100 MG tablet Take 100 mg by mouth daily.   Yes [provider]   No Known Allergies  FAMILY HISTORY:  family history includes Cancer in his father; Diabetes in his father; Stroke in his father; Thyroid disease in his mother. SOCIAL HISTORY:  reports that he quit smoking about 9 years ago. He quit smokeless tobacco use about 9 years ago. He reports previous drug use. He reports that he does not drink alcohol.  REVIEW OF SYSTEMS:   Unable to obtain due to critical illness  VITAL SIGNS: Temp:  [98.8 F (37.1 C)-100.2 F (37.9 C)] 98.8 F (37.1 C) (12/21 0800) Pulse Rate:  [93-105] 93 (12/21 0812) Resp:  [19-42] 20 (12/21 0812) BP: (56-192)/(24-146) 104/62 (12/21 0600) SpO2:  [68 %-100 %] 100 % (12/21 0812) Weight:  [111.6 kg] 111.6 kg (12/20 0855)   I/O last 3 completed shifts: In: 853.9 [I.V.:553.9; IV Piggyback:300] Out: 40 [Urine:40] No intake/output data recorded.   SpO2: 100 % O2 Flow Rate (L/min): 3  L/min   Physical Examination:  GENERAL:critically ill appearing, +resp distress HEAD: Normocephalic, atraumatic.  EYES: Pupils equal, round, reactive to light.  No scleral icterus.  MOUTH: Moist mucosal membrane. NECK: Supple. No JVD.  PULMONARY: +rhonchi, +wheezing CARDIOVASCULAR: S1 and S2. Regular rate and rhythm. No murmurs, rubs, or gallops.  GASTROINTESTINAL: Soft, nontender, -distended. No masses. Positive bowel sounds. No hepatosplenomegaly.    MUSCULOSKELETAL: No swelling, clubbing, or edema.  NEUROLOGIC:lethragic but arousable SKIN:intact,warm,dry  I personally reviewed lab work that was obtained in last 24 hrs. CXR Independently reviewed-LLL opacity c/w pneumonia  MEDICATIONS: I have reviewed all medications and confirmed regimen as documented   CULTURE RESULTS   Recent Results (from the past 240 hour(s))  Respiratory Panel by RT PCR (Flu A&B, Covid) - Nasopharyngeal Swab     Status: None   Collection Time: 05/20/2019  2:35 AM   Specimen: Nasopharyngeal Swab  Result Value Ref Range Status   SARS Coronavirus 2 by RT PCR NEGATIVE NEGATIVE Final    Comment: (NOTE) SARS-CoV-2 target nucleic acids are NOT DETECTED. The SARS-CoV-2 RNA is generally detectable in upper respiratoy specimens during the acute phase of infection. The lowest concentration of SARS-CoV-2 viral copies this assay can detect is 131 copies/mL. A negative result does not preclude SARS-Cov-2 infection and should not be used as the sole basis for treatment or other patient management decisions. A negative result may occur with  improper specimen collection/handling, submission of specimen other than nasopharyngeal swab, presence of viral mutation(s) within the areas targeted by this assay, and inadequate number of viral copies (<131 copies/mL). A negative result must be combined with clinical observations, patient history, and epidemiological information. The expected result is Negative. Fact Sheet for Patients:  PinkCheek.be Fact Sheet for Healthcare Providers:  GravelBags.it This test is not yet ap proved or cleared by the [place] FDA and  has been authorized for detection and/or diagnosis of SARS-CoV-2 by FDA under an Emergency Use Authorization (EUA). This EUA will remain  in effect (meaning this test can be used) for the duration of the COVID-19 declaration under Section 564(b)(1) of  the Act, 21 U.S.C. section 360bbb-3(b)(1), unless the authorization is terminated or revoked sooner.    Influenza A by PCR NEGATIVE NEGATIVE Final   Influenza B by PCR NEGATIVE NEGATIVE Final    Comment: (NOTE) The Xpert Xpress SARS-CoV-2/FLU/RSV assay is intended as an aid in  the diagnosis of influenza from Nasopharyngeal swab specimens and  should not be used as a sole basis for treatment. Nasal washings and  aspirates are unacceptable for Xpert Xpress SARS-CoV-2/FLU/RSV  testing. Fact Sheet for Patients: PinkCheek.be Fact Sheet for Healthcare Providers: GravelBags.it This test is not yet approved or cleared by the [place] FDA and  has been authorized for detection and/or diagnosis of SARS-CoV-2 by  FDA under an Emergency Use Authorization (EUA). This EUA will remain  in effect (meaning this test can be used) for the duration of the  Covid-19 declaration under Section 564(b)(1) of the Act, 21  U.S.C. section 360bbb-3(b)(1), unless the authorization is  terminated or revoked. Performed at [facility], [address] [place], [place] [address]   Culture, blood (routine x 2)     Status: None (Preliminary result)   Collection Time: 06/04/2019  2:36 AM   Specimen: BLOOD  Result Value Ref Range Status   Specimen Description BLOOD LEFT ANTECUBITAL  Final   Special Requests   Final    BOTTLES DRAWN AEROBIC AND ANAEROBIC Blood Culture  adequate volume   Culture   Final    NO GROWTH 2 DAYS Performed at [facility] [address] [place], [place] [address]    Report Status PENDING  Incomplete  Culture, blood (routine x 2)     Status: None (Preliminary result)   Collection Time: 06/05/2019  2:36 AM   Specimen: BLOOD  Result Value Ref Range Status   Specimen Description BLOOD RIGHT FOREARM  Final   Special Requests   Final    BOTTLES DRAWN AEROBIC AND ANAEROBIC Blood Culture adequate volume   Culture    Final    NO GROWTH 2 DAYS Performed at [facility], [address] [place], [place] [address]    Report Status PENDING  Incomplete  Culture, blood (routine x 2)     Status: None (Preliminary result)   Collection Time: 06/10/2019  3:10 PM   Specimen: BLOOD  Result Value Ref Range Status   Specimen Description BLOOD A-LINE  Final   Special Requests   Final    BOTTLES DRAWN AEROBIC AND ANAEROBIC Blood Culture results may not be optimal due to an excessive volume of blood received in culture bottles   Culture   Final    NO GROWTH 2 DAYS Performed at [facility], [address] [place], [place] [address]    Report Status PENDING  Incomplete  Culture, blood (routine x 2)     Status: None (Preliminary result)   Collection Time: 06/04/2019  3:10 PM   Specimen: BLOOD  Result Value Ref Range Status   Specimen Description BLOOD A-LINE  Final   Special Requests   Final    BOTTLES DRAWN AEROBIC AND ANAEROBIC Blood Culture results may not be optimal due to an excessive volume of blood received in culture bottles   Culture   Final    NO GROWTH 2 DAYS Performed at [facility], [address], [place], [place] [address]    Report Status PENDING  Incomplete  MRSA PCR Screening     Status: None   Collection Time: 06/03/19  5:45 PM   Specimen: Nasopharyngeal  Result Value Ref Range Status   MRSA by PCR NEGATIVE NEGATIVE Final    Comment:        The GeneXpert MRSA Assay (FDA approved for NASAL specimens only), is one component of a comprehensive MRSA colonization surveillance program. It is not intended to diagnose MRSA infection nor to guide or monitor treatment for MRSA infections. Performed at [facility], [address] [place], [place] [address]           IMAGING    CT ANGIO CHEST PE W OR WO CONTRAST  Result Date: 06/03/2019 CLINICAL DATA:  Shortness of breath. Missed recent dialysis sessions. EXAM: CT ANGIOGRAPHY CHEST WITH  CONTRAST TECHNIQUE: Multidetector CT imaging of the chest was performed using the standard protocol during bolus administration of intravenous contrast. Multiplanar CT image reconstructions and MIPs were obtained to evaluate the vascular anatomy. CONTRAST:  146mL OMNIPAQUE IOHEXOL 350 MG/ML SOLN COMPARISON:  Chest x-ray from yesterday. FINDINGS: Cardiovascular: Suboptimal evaluation of the segmental pulmonary arteries due to contrast bolus timing and motion artifact. No evidence of central or lobar pulmonary embolism. Unchanged mild cardiomegaly. No pericardial effusion. No thoracic aortic aneurysm or dissection. Coronary, aortic arch, and branch vessel atherosclerotic vascular disease. Dense calcification of the mitral annulus. Mediastinum/Nodes: No enlarged mediastinal, hilar, or axillary lymph nodes. Thyroid gland, trachea, and esophagus demonstrate no significant findings. Lungs/Pleura: Trace left pleural  effusion. Consolidation atelectasis in the left lower lobe. Minimal subsegmental atelectasis in the right lower lobe. Chronic pleuroparenchymal scarring in the left upper lobe. No pneumothorax. Upper Abdomen: No acute abnormality. Musculoskeletal: No chest wall abnormality. No acute or significant osseous findings. Review of the MIP images confirms the above findings. IMPRESSION: 1. No central or lobar pulmonary embolism. Suboptimal opacification of the segmental pulmonary arteries due to contrast bolus timing and motion artifact. 2. Left lower lobe pneumonia.  Trace left pleural effusion. 3.  Aortic atherosclerosis (ICD10-I70.0). Electronically Signed   By: Titus Dubin M.D.   On: 06/03/2019 12:34   US Carotid Bilateral (at [facility] and [facility] only)  Result Date: 06/03/2019 CLINICAL DATA:  48 year old male with acute altered mental status, possible stroke EXAM: BILATERAL CAROTID DUPLEX ULTRASOUND TECHNIQUE: Pearline Cables scale imaging, color Doppler and duplex ultrasound were performed of bilateral carotid and  vertebral arteries in the neck. COMPARISON:  Brain MRI 06/03/2019 FINDINGS: Criteria: Quantification of carotid stenosis is based on velocity parameters that correlate the residual internal carotid diameter with NASCET-based stenosis levels, using the diameter of the distal internal carotid lumen as the denominator for stenosis measurement. The following velocity measurements were obtained: RIGHT ICA: 103/36 cm/sec CCA: 123XX123 cm/sec SYSTOLIC ICA/CCA RATIO:  1.1 ECA:  171 cm/sec LEFT ICA: 79/24 cm/sec CCA: [id number] cm/sec SYSTOLIC ICA/CCA RATIO:  0.6 ECA:  106 cm/sec RIGHT CAROTID ARTERY: Mild heterogeneous atherosclerotic plaque in the proximal internal carotid artery. By peak systolic velocity criteria, the estimated stenosis remains less than 50%. RIGHT VERTEBRAL ARTERY:  Patent with normal antegrade flow. LEFT CAROTID ARTERY: No significant atherosclerotic plaque or evidence of stenosis in the internal carotid artery. LEFT VERTEBRAL ARTERY:  Patent with normal antegrade flow. IMPRESSION: Mild (1-49%) stenosis proximal right internal carotid artery secondary to heterogenous atherosclerotic plaque. No significant atherosclerotic plaque or evidence of stenosis in the left internal carotid artery. The vertebral arteries are patent with normal antegrade flow. Electronically Signed   By: Jacqulynn Cadet M.D.   On: 06/03/2019 10:40   ECHOCARDIOGRAM COMPLETE BUBBLE STUDY  Result Date: 06/03/2019   ECHOCARDIOGRAM REPORT   Patient Name:   Jake Gomez Date of Exam: 06/03/2019 Medical Rec #:  [id number]    Height:       68.0 in Accession #:    [id number]   Weight:       246.0 lb Date of Birth:  1970/07/09    BSA:          2.23 m Patient Age:    27 years     BP:           111/37 mmHg Patient Gender: M            HR:           97 bpm. Exam Location:  [facility] Procedure: 2D Echo Indications:     STROKE 434.91/I63.9  History:         Patient has no prior history of Echocardiogram examinations.                  Risk  Factors:Hypertension and Diabetes. ESRD.  Sonographer:     Avanell Shackleton Referring Phys:  [place] Diagnosing Phys: Serafina Royals MD  Sonographer Comments: Technically challenging study due to limited acoustic windows, Technically difficult study due to poor echo windows, suboptimal parasternal window and suboptimal apical window. Image acquisition challenging due to patient body habitus. PT COULD NOT TURN ON HIS LEFT SIDE. BUBBLE STUDY WAS ATTEMPTED TWICE IMPRESSIONS  1.  Left ventricular ejection fraction, by visual estimation, is 60 to 65%. The left ventricle has normal function. There is no left ventricular hypertrophy.  2. The left ventricle has no regional wall motion abnormalities.  3. Global right ventricle has normal systolic function.The right ventricular size is normal. No increase in right ventricular wall thickness.  4. Left atrial size was normal.  5. Right atrial size was normal.  6. The mitral valve is normal in structure. Mild mitral valve regurgitation.  7. The tricuspid valve is normal in structure. Tricuspid valve regurgitation is mild.  8. The aortic valve is normal in structure. Aortic valve regurgitation is mild.  9. The pulmonic valve was normal in structure. Pulmonic valve regurgitation is not visualized. FINDINGS  Left Ventricle: Left ventricular ejection fraction, by visual estimation, is 60 to 65%. The left ventricle has normal function. The left ventricle has no regional wall motion abnormalities. There is no left ventricular hypertrophy. Right Ventricle: The right ventricular size is normal. No increase in right ventricular wall thickness. Global RV systolic function is has normal systolic function. Left Atrium: Left atrial size was normal in size. Right Atrium: Right atrial size was normal in size Pericardium: There is no evidence of pericardial effusion. Mitral Valve: The mitral valve is normal in structure. Mild mitral valve regurgitation. Tricuspid Valve: The tricuspid  valve is normal in structure. Tricuspid valve regurgitation is mild. Aortic Valve: The aortic valve is normal in structure. Aortic valve regurgitation is mild. Aortic valve mean gradient measures 8.0 mmHg. Aortic valve peak gradient measures 12.7 mmHg. Aortic valve area, by VTI measures 2.41 cm. Pulmonic Valve: The pulmonic valve was normal in structure. Pulmonic valve regurgitation is not visualized. Pulmonic regurgitation is not visualized. Aorta: The aortic root, ascending aorta and aortic arch are all structurally normal, with no evidence of dilitation or obstruction. IAS/Shunts: No atrial level shunt detected by color flow Doppler. Agitated saline contrast was given intravenously to evaluate for intracardiac shunting.  LEFT VENTRICLE PLAX 2D LVIDd:         4.74 cm LVIDs:         2.81 cm LV PW:         1.20 cm LV IVS:        1.47 cm LVOT diam:     2.00 cm LV SV:         75 ml LV SV Index:   31.60 LVOT Area:     3.14 cm  IVC IVC diam: 2.10 cm LEFT ATRIUM            Index LA diam:      5.30 cm  2.37 cm/m LA Vol (A4C): 114.0 ml 51.08 ml/m  AORTIC VALVE AV Area (Vmax):    2.72 cm AV Area (Vmean):   2.51 cm AV Area (VTI):     2.41 cm AV Vmax:           178.00 cm/s AV Vmean:          129.000 cm/s AV VTI:            0.319 m AV Peak Grad:      12.7 mmHg AV Mean Grad:      8.0 mmHg LVOT Vmax:         154.00 cm/s LVOT Vmean:        103.000 cm/s LVOT VTI:          0.245 m LVOT/AV VTI ratio: 0.77  AORTA Ao Root diam: 3.10 cm MITRAL VALVE MV Area (PHT):  7.16 cm              SHUNTS MV PHT:        30.74 msec            Systemic VTI:  0.24 m MV Decel Time: 106 msec              Systemic Diam: 2.00 cm MV E velocity: 160.00 cm/s 103 cm/s MV A velocity: 171.00 cm/s 70.3 cm/s MV E/A ratio:  0.94        1.5  Serafina Royals MD Electronically signed by Serafina Royals MD Signature Date/Time: 06/03/2019/2:08:13 PM    Final         Indwelling Urinary Catheter continued, requirement due to   Reason to continue Indwelling  Urinary Catheter strict Intake/Output monitoring for hemodynamic instability       ASSESSMENT AND PLAN SYNOPSIS  48 yo Hispanic male with acute and severe resp failure from acute pneumonia with previous COVID-19 infection with acute CVA based on MRI  Severe ACUTE Hypoxic and Hypercapnic Respiratory Failure On biPAP High risk for for intubation High risk for cardiac arrest    KIDNEY INJURY/Renal Failure -follow chem 7 -follow UO ESRD on HD Follow up nephrology recs  Septic shock -use vasopressors to keep MAP>65 as needed -follow ABG and LA -follow up cultures -emperic ABX   NEUROLOGY acute CVA Follow up Neurology recs   CARDIAC ICU monitoring  ID -continue IV abx as prescibed -follow up cultures  GI GI PROPHYLAXIS as indicated  NUTRITIONAL STATUS DIET-->NPO Constipation protocol as indicated   ENDO - will use ICU hypoglycemic\Hyperglycemia protocol if needed    ELECTROLYTES -follow labs as needed -replace as needed -pharmacy consultation and following   DVT/GI PRX ordered TRANSFUSIONS AS NEEDED MONITOR FSBS ASSESS the need for LABS    Critical Care Time devoted to patient care services described in this note is 45 minutes.   Overall, patient is critically ill, prognosis is guarded.  Patient with Multiorgan failure and at high risk for cardiac arrest and death.    Corrin Parker, M.D.  [name]  Medical Director [place] Director [facility] Cardio-Pulmonary Department

## 2019-06-04 NOTE — Progress Notes (Signed)
Patient intubated by Dr. Mortimer Fries with 8.0 ETT, 24cm at lips and assisted by Volney Presser with cardiopulmonary.  Positive color changed noted on c02 detector, condensation noted in ETT, equal bilateral breath sounds.  ETT secured with commercial tube holder.  OG placed by Dr. Mortimer Fries.  CXR and KUB results pending post intubation.

## 2019-06-04 NOTE — Progress Notes (Signed)
Pharmacy has reviewed patient medications for CRRT.  The following adjustments have been made to the patient's medication list:   Zosyn 3.375 g IV q6 hours  Vancomycin 1 g q24 hours  Gerald Dexter, PharmD Pharmacy Resident  05/24/2019 2:53 PM

## 2019-06-04 NOTE — Progress Notes (Signed)
OT Cancellation Note  Patient Details Name: Jake Gomez MRN: [id number] DOB: [birth year]   Cancelled Treatment:    Reason Eval/Treat Not Completed: Medical issues which prohibited therapy. Consult received, chart reviewed. Pt with K+ of 6.1, falling outside guidelines for participation in therapy. Will continue to follow and re-attempt when medically appropriate.   Jeni Salles, MPH, MS, OTR/L ascom [phone] 06/10/2019, 8:08 AM

## 2019-06-04 NOTE — Progress Notes (Signed)
CRRT started per policy and procedure 

## 2019-06-04 NOTE — Op Note (Signed)
Operative Note     Preoperative diagnosis:   1. ESRD with functional permanent access and suspected PermCath infection  Postoperative diagnosis:  1. ESRD with functional permanent access and suspected PermCath infection  Procedure:  Removal of right jugular Permcath  Surgeon:  Leotis Pain, MD  Anesthesia:  Local  EBL:  Minimal  Indication for the Procedure:  The patient has a functional permanent dialysis access and no longer needs their permcath.  This can be removed.  In addition, there is concern for PermCath infection and this should be removed and tip sent for culture as a possible source.  Risks and benefits are discussed and informed consent is obtained.  Description of the Procedure:  The patient's right neck, chest and existing catheter were sterilely prepped and draped. The area around the catheter was anesthetized copiously with 1% lidocaine. The catheter was dissected out with curved hemostats until the cuff was freed from the surrounding fibrous sheath. The fiber sheath was transected, and the catheter was then removed in its entirety using gentle traction. Pressure was held and sterile dressings were placed. The patient tolerated the procedure well and was taken to the recovery room in stable condition.     Leotis Pain  06/09/2019, 2:01 PM This note was created with Dragon Medical transcription system. Any errors in dictation are purely unintentional.

## 2019-06-04 NOTE — Progress Notes (Signed)
PT Cancellation Note  Patient Details Name: Jake Gomez MRN: [id number] DOB: 14-Nov-1970   Cancelled Treatment:    Reason Eval/Treat Not Completed: Medical issues which prohibited therapy(Per chart review, most recent K+ 6.1 outside of safe range for working with PT. WIll attempt again at later date/time.)  8:32 AM, 05/18/2019 Etta Grandchild, PT, DPT Physical Therapist - [facility]  [phone] ([place])    Tico Crotteau C 05/19/2019, 8:32 AM

## 2019-06-04 NOTE — Consult Note (Signed)
Subjective: No further twitching noted on examination   Past Medical History:  Diagnosis Date  . Diabetes mellitus without complication ([place])   . Hypertension   . Renal disorder   . Sleep apnea    can't afford CPAP    Past Surgical History:  Procedure Laterality Date  . AV FISTULA PLACEMENT Left 01/31/2019   Procedure: ARTERIOVENOUS (AV) FISTULA CREATION ( BRACHIOCEPHALIC );  Surgeon: Algernon Huxley, MD;  Location: [facility];  Service: Vascular;  Laterality: Left;  . DIALYSIS/PERMA CATHETER INSERTION N/A 07/19/2018   Procedure: DIALYSIS/PERMA CATHETER INSERTION;  Surgeon: Algernon Huxley, MD;  Location: [place] [facility];  Service: Cardiovascular;  Laterality: N/A;  . DIALYSIS/PERMA CATHETER INSERTION N/A 09/13/2018   Procedure: DIALYSIS/PERMA CATHETER INSERTION;  Surgeon: Katha Cabal, MD;  Location: [place] [facility];  Service: Cardiovascular;  Laterality: N/A;  . peritonal dialysis    . REMOVAL OF A DIALYSIS CATHETER N/A 07/17/2018   Procedure: REMOVAL OF A DIALYSIS CATHETER;  Surgeon: Algernon Huxley, MD;  Location: [facility];  Service: Vascular;  Laterality: N/A;    Family History  Problem Relation Age of Onset  . Thyroid disease Mother   . Stroke Father   . Cancer Father   . Diabetes Father    Social History:  reports that he quit smoking about 9 years ago. He quit smokeless tobacco use about 9 years ago. He reports previous drug use. He reports that he does not drink alcohol.  Allergies: No Known Allergies  Medications:  I have reviewed the patient's current medications. Prior to Admission:  Prior to Admission medications   Medication Sig Start Date End Date Taking? Authorizing Provider  acetaminophen (TYLENOL) 325 MG tablet Take 1,000 mg by mouth every 6 (six) hours as needed for mild pain.   Yes [provider]  amLODipine (NORVASC) 10 MG tablet Take 1 tablet (10 mg total) by mouth daily. Patient taking differently: Take 10 mg by mouth daily. 10mg  at night and  5mg  in the am- nephrology 07/23/18  Yes Stark Jock, Jude, MD  atorvastatin (LIPITOR) 80 MG tablet Take 1 tablet (80 mg total) by mouth every morning. 10/17/18  Yes Juline Patch, MD  carvedilol (COREG) 12.5 MG tablet Take 1 tablet (12.5 mg total) by mouth 2 (two) times daily with a meal. 10/17/18  Yes Juline Patch, MD  folic acid (FOLVITE) 1 MG tablet Take 1 tablet (1 mg total) by mouth daily. 05/06/18  Yes Vaughan Basta, MD  gabapentin (NEURONTIN) 300 MG capsule Take 300 mg by mouth daily.   Yes [provider]  glipiZIDE (GLUCOTROL) 5 MG tablet Take 1 tablet (5 mg total) by mouth 2 (two) times daily before a meal. 10/17/18  Yes Juline Patch, MD  irbesartan (AVAPRO) 300 MG tablet Take 300 mg by mouth at bedtime.  05/14/17  Yes [provider]  torsemide (DEMADEX) 100 MG tablet Take 100 mg by mouth daily.   Yes [provider]     Scheduled: .  stroke: mapping our early stages of recovery book   Does not apply Once  . aspirin  300 mg Rectal Daily   Or  . aspirin EC  325 mg Oral Daily  . atorvastatin  80 mg Oral q morning - 10a  . Chlorhexidine Gluconate Cloth  6 each Topical Q0600  . insulin aspart  0-9 Units Subcutaneous TID WC    ROS: History obtained from the patient  General ROS: negative for - chills, fatigue,  fever, night sweats, weight gain or weight loss Psychological ROS: negative for - behavioral disorder, hallucinations, memory difficulties, mood swings or suicidal ideation Ophthalmic ROS: negative for - blurry vision, double vision, eye pain or loss of vision ENT ROS: negative for - epistaxis, nasal discharge, oral lesions, sore throat, tinnitus or vertigo Allergy and Immunology ROS: negative for - hives or itchy/watery eyes Hematological and Lymphatic ROS: negative for - bleeding problems, bruising or swollen lymph nodes Endocrine ROS: negative for - galactorrhea, hair pattern changes, polydipsia/polyuria or temperature intolerance Respiratory  ROS: shortness of breath Cardiovascular ROS: negative for - chest pain, dyspnea on exertion, edema or irregular heartbeat Gastrointestinal ROS: negative for - abdominal pain, diarrhea, hematemesis, nausea/vomiting or stool incontinence Genito-Urinary ROS: negative for - dysuria, hematuria, incontinence or urinary frequency/urgency Musculoskeletal ROS: weakness Neurological ROS: as noted in HPI Dermatological ROS: negative for rash and skin lesion changes  Physical Examination: Blood pressure (!) 171/62, pulse 100, temperature 99.2 F (37.3 C), temperature source Oral, resp. rate 20, height 5\' 8"  (1.727 m), weight 111.6 kg, SpO2 95 %.  HEENT-  Normocephalic, no lesions, without obvious abnormality.  Normal external eye and conjunctiva.  Normal TM's bilaterally.  Normal auditory canals and external ears. Normal external nose, mucus membranes and septum.  Normal pharynx. Cardiovascular- S1, S2 normal, pulses palpable throughout   Abdomen- soft, non-tender; bowel sounds normal; no masses,  no organomegaly Extremities- LE edema Lymph-no adenopathy palpable Musculoskeletal-no joint tenderness, deformity or swelling Skin-warm and dry, no hyperpigmentation, vitiligo, or suspicious lesions  Neurological Examination   Mental Status: Alert, oriented, thought content appropriate.  Speech minimal but fluent without evidence of aphasia.  Able to follow 3 step commands without difficulty. Cranial Nerves: II: Discs flat bilaterally; Visual fields grossly normal, pupils equal, round, reactive to light and accommodation III,IV, VI: ptosis not present, extra-ocular motions intact bilaterally V,VII: smile symmetric, facial light touch sensation normal bilaterally VIII: hearing normal bilaterally IX,X: gag reflex present XI: bilateral shoulder shrug XII: midline tongue extension Motor: Lifts all extremities against gravity but does not overcome resistance.   Sensory: Pinprick and light touch intact  throughout, bilaterally Deep Tendon Reflexes: Symmetric throughout Plantars: Right: mute   Left: mute Cerebellar: Normal finger-to-nose testing bilaterally Gait: not tested due to safety concerns    Laboratory Studies:  Basic Metabolic Panel: Recent Labs  Lab 06/11/2019 0048 05/16/2019 0456 06/06/2019 1941 06/03/19 1136 05/27/2019 0343  NA 141 143 139 138 137  K 6.4* 6.4* 4.0 5.5* 6.1*  CL 101 101 98 98 96*  CO2 20* 21* 21* 23 24  GLUCOSE 180* 227* 108* 82 142*  BUN 121* 123* 61* 76* 93*  CREATININE 15.54* 15.46* 8.76* 10.34* 12.14*  CALCIUM 7.3* 7.2* 7.8* 6.6* 6.2*  PHOS  --  7.9*  --   --   --     Liver Function Tests: Recent Labs  Lab 06/06/2019 0048 06/10/2019 1941  AST 27 109*  ALT 28 71*  ALKPHOS 190* 318*  BILITOT 0.8 2.3*  PROT 8.0 7.4  ALBUMIN 4.0 3.7   No results for input(s): LIPASE, AMYLASE in the last 168 hours. No results for input(s): AMMONIA in the last 168 hours.  CBC: Recent Labs  Lab 06/13/2019 0048 05/15/2019 0456 05/16/2019 1941 06/03/19 1136 06/06/2019 0343  WBC 7.6 6.4 1.8* 19.6* 33.0*  NEUTROABS 4.9  --  1.3*  --   --   HGB 9.3* 8.3* 8.6* 9.3* 8.6*  HCT 29.3* 26.0* 26.8* 28.2* 27.3*  MCV 102.4* 102.8* 101.5* 99.3  102.2*  PLT 118* 106* 66* 47* 58*    Cardiac Enzymes: No results for input(s): CKTOTAL, CKMB, CKMBINDEX, TROPONINI in the last 168 hours.  BNP: Invalid input(s): POCBNP  CBG: Recent Labs  Lab 06/03/19 1901 06/03/19 2105 05/16/2019 0421 06/11/2019 0723 05/22/2019 1203  GLUCAP 102* 96 124* 124* 116*    Microbiology: Results for orders placed or performed during the hospital encounter of 06/14/2019  Respiratory Panel by RT PCR (Flu A&B, Covid) - Nasopharyngeal Swab     Status: None   Collection Time: 05/17/2019  2:35 AM   Specimen: Nasopharyngeal Swab  Result Value Ref Range Status   SARS Coronavirus 2 by RT PCR NEGATIVE NEGATIVE Final    Comment: (NOTE) SARS-CoV-2 target nucleic acids are NOT DETECTED. The SARS-CoV-2 RNA is  generally detectable in upper respiratoy specimens during the acute phase of infection. The lowest concentration of SARS-CoV-2 viral copies this assay can detect is 131 copies/mL. A negative result does not preclude SARS-Cov-2 infection and should not be used as the sole basis for treatment or other patient management decisions. A negative result may occur with  improper specimen collection/handling, submission of specimen other than nasopharyngeal swab, presence of viral mutation(s) within the areas targeted by this assay, and inadequate number of viral copies (<131 copies/mL). A negative result must be combined with clinical observations, patient history, and epidemiological information. The expected result is Negative. Fact Sheet for Patients:  PinkCheek.be Fact Sheet for Healthcare Providers:  GravelBags.it This test is not yet ap proved or cleared by the [place] FDA and  has been authorized for detection and/or diagnosis of SARS-CoV-2 by FDA under an Emergency Use Authorization (EUA). This EUA will remain  in effect (meaning this test can be used) for the duration of the COVID-19 declaration under Section 564(b)(1) of the Act, 21 U.S.C. section 360bbb-3(b)(1), unless the authorization is terminated or revoked sooner.    Influenza A by PCR NEGATIVE NEGATIVE Final   Influenza B by PCR NEGATIVE NEGATIVE Final    Comment: (NOTE) The Xpert Xpress SARS-CoV-2/FLU/RSV assay is intended as an aid in  the diagnosis of influenza from Nasopharyngeal swab specimens and  should not be used as a sole basis for treatment. Nasal washings and  aspirates are unacceptable for Xpert Xpress SARS-CoV-2/FLU/RSV  testing. Fact Sheet for Patients: PinkCheek.be Fact Sheet for Healthcare Providers: GravelBags.it This test is not yet approved or cleared by the [place] FDA and   has been authorized for detection and/or diagnosis of SARS-CoV-2 by  FDA under an Emergency Use Authorization (EUA). This EUA will remain  in effect (meaning this test can be used) for the duration of the  Covid-19 declaration under Section 564(b)(1) of the Act, 21  U.S.C. section 360bbb-3(b)(1), unless the authorization is  terminated or revoked. Performed at [facility], [address] [place], [place] [id number]   Culture, blood (routine x 2)     Status: None (Preliminary result)   Collection Time: 05/25/2019  2:36 AM   Specimen: BLOOD  Result Value Ref Range Status   Specimen Description BLOOD LEFT ANTECUBITAL  Final   Special Requests   Final    BOTTLES DRAWN AEROBIC AND ANAEROBIC Blood Culture adequate volume   Culture   Final    NO GROWTH 2 DAYS Performed at [facility], [address] [place], [place] [address]    Report Status PENDING  Incomplete  Culture, blood (routine x 2)     Status: None (Preliminary result)  Collection Time: 05/17/2019  2:36 AM   Specimen: BLOOD  Result Value Ref Range Status   Specimen Description BLOOD RIGHT FOREARM  Final   Special Requests   Final    BOTTLES DRAWN AEROBIC AND ANAEROBIC Blood Culture adequate volume   Culture   Final    NO GROWTH 2 DAYS Performed at [facility], [address], [place], [place] [address]    Report Status PENDING  Incomplete  Culture, blood (routine x 2)     Status: None (Preliminary result)   Collection Time: 06/10/2019  3:10 PM   Specimen: BLOOD  Result Value Ref Range Status   Specimen Description BLOOD A-LINE  Final   Special Requests   Final    BOTTLES DRAWN AEROBIC AND ANAEROBIC Blood Culture results may not be optimal due to an excessive volume of blood received in culture bottles   Culture   Final    NO GROWTH 2 DAYS Performed at [facility], [address] [place], [place] [address]    Report Status PENDING  Incomplete  Culture, blood (routine x 2)      Status: None (Preliminary result)   Collection Time: 05/28/2019  3:10 PM   Specimen: BLOOD  Result Value Ref Range Status   Specimen Description BLOOD A-LINE  Final   Special Requests   Final    BOTTLES DRAWN AEROBIC AND ANAEROBIC Blood Culture results may not be optimal due to an excessive volume of blood received in culture bottles   Culture   Final    NO GROWTH 2 DAYS Performed at [facility], [address], [place], [place] [address]    Report Status PENDING  Incomplete  MRSA PCR Screening     Status: None   Collection Time: 06/03/19  5:45 PM   Specimen: Nasopharyngeal  Result Value Ref Range Status   MRSA by PCR NEGATIVE NEGATIVE Final    Comment:        The GeneXpert MRSA Assay (FDA approved for NASAL specimens only), is one component of a comprehensive MRSA colonization surveillance program. It is not intended to diagnose MRSA infection nor to guide or monitor treatment for MRSA infections. Performed at [facility], [address] [place], [place] [address]     Coagulation Studies: No results for input(s): LABPROT, INR in the last 72 hours.  Urinalysis: No results for input(s): COLORURINE, LABSPEC, PHURINE, GLUCOSEU, HGBUR, BILIRUBINUR, KETONESUR, PROTEINUR, UROBILINOGEN, NITRITE, LEUKOCYTESUR in the last 168 hours.  Invalid input(s): APPERANCEUR  Lipid Panel:    Component Value Date/Time   CHOL 101 05/15/2019 0343   TRIG 595 (H) 05/26/2019 0343   HDL <10 (L) 06/14/2019 0343   CHOLHDL UNABLE TO CALCULATE DUE TO NON NUMERIC VALUE  05/23/2019 0343   VLDL UNABLE TO CALCULATE IF TRIGLYCERIDE OVER 400 mg/dL 06/03/2019 0343   LDLCALC UNABLE TO CALCULATE IF TRIGLYCERIDE OVER 400 mg/dL 05/29/2019 0343    HgbA1C:  Lab Results  Component Value Date   HGBA1C 6.4 (H) 05/31/2019    Urine Drug Screen:  No results found for: LABOPIA, COCAINSCRNUR, LABBENZ, AMPHETMU, THCU, LABBARB  Alcohol Level: No results for input(s): ETH in the last 168  hours.  Imaging: DG Abd 1 View  Result Date: 05/21/2019 CLINICAL DATA:  OG tube placement EXAM: ABDOMEN - 1 VIEW COMPARISON:  None. FINDINGS: OG tube tip is at the GE junction with the side port in the distal esophagus. Nonobstructive bowel gas pattern. IMPRESSION: OG tube tip at the GE junction. This  could be advanced several cm into the stomach. Electronically Signed   By: Rolm Baptise M.D.   On: 06/12/2019 12:19   CT Head Wo Contrast  Result Date: 05/20/2019 CLINICAL DATA:  48 year old male with syncope. Patient with end-stage renal disease on dialysis. EXAM: CT HEAD WITHOUT CONTRAST TECHNIQUE: Contiguous axial images were obtained from the base of the skull through the vertex without intravenous contrast. COMPARISON:  11/17/2010 CT FINDINGS: Brain: No evidence of acute infarction, hemorrhage, hydrocephalus, extra-axial collection or mass lesion/mass effect. Vascular: Mild carotid atherosclerotic calcifications noted. Skull: Normal. Negative for fracture or focal lesion. Sinuses/Orbits: No acute finding. Other: None. IMPRESSION: No evidence of acute intracranial abnormality. Electronically Signed   By: Margarette Canada M.D.   On: 05/24/2019 20:20   CT ANGIO CHEST PE W OR WO CONTRAST  Result Date: 06/03/2019 CLINICAL DATA:  Shortness of breath. Missed recent dialysis sessions. EXAM: CT ANGIOGRAPHY CHEST WITH CONTRAST TECHNIQUE: Multidetector CT imaging of the chest was performed using the standard protocol during bolus administration of intravenous contrast. Multiplanar CT image reconstructions and MIPs were obtained to evaluate the vascular anatomy. CONTRAST:  175mL OMNIPAQUE IOHEXOL 350 MG/ML SOLN COMPARISON:  Chest x-ray from yesterday. FINDINGS: Cardiovascular: Suboptimal evaluation of the segmental pulmonary arteries due to contrast bolus timing and motion artifact. No evidence of central or lobar pulmonary embolism. Unchanged mild cardiomegaly. No pericardial effusion. No thoracic aortic aneurysm  or dissection. Coronary, aortic arch, and branch vessel atherosclerotic vascular disease. Dense calcification of the mitral annulus. Mediastinum/Nodes: No enlarged mediastinal, hilar, or axillary lymph nodes. Thyroid gland, trachea, and esophagus demonstrate no significant findings. Lungs/Pleura: Trace left pleural effusion. Consolidation atelectasis in the left lower lobe. Minimal subsegmental atelectasis in the right lower lobe. Chronic pleuroparenchymal scarring in the left upper lobe. No pneumothorax. Upper Abdomen: No acute abnormality. Musculoskeletal: No chest wall abnormality. No acute or significant osseous findings. Review of the MIP images confirms the above findings. IMPRESSION: 1. No central or lobar pulmonary embolism. Suboptimal opacification of the segmental pulmonary arteries due to contrast bolus timing and motion artifact. 2. Left lower lobe pneumonia.  Trace left pleural effusion. 3.  Aortic atherosclerosis (ICD10-I70.0). Electronically Signed   By: Titus Dubin M.D.   On: 06/03/2019 12:34   MR ANGIO HEAD WO CONTRAST  Result Date: 06/03/2019 CLINICAL DATA:  Initial evaluation for acute altered mental status. EXAM: MRI HEAD WITHOUT CONTRAST MRA HEAD WITHOUT CONTRAST TECHNIQUE: Multiplanar, multiecho pulse sequences of the brain and surrounding structures were obtained without intravenous contrast. Angiographic images of the head were obtained using MRA technique without contrast. COMPARISON:  Prior CT from 05/27/2019. FINDINGS: MRI HEAD FINDINGS Brain: Examination moderately degraded by motion artifact. Cerebral volume within normal limits for age. Patchy multifocal areas of restricted diffusion seen involving the periventricular white matter of the corona radiata and centrum semi ovale bilaterally, right slightly worse than left. Largest area of infarction seen on the right and measures up to 2.1 cm. No associated hemorrhage or mass effect. No other evidence for acute or subacute  ischemia. Gray-white matter differentiation otherwise maintained. No encephalomalacia to suggest chronic cortical infarction. 8 mm focus of susceptibility artifact noted at the periventricular white matter of the right centrum semi ovale, consistent with a chronic microhemorrhage. No mass lesion, midline shift or mass effect. No hydrocephalus. No extra-axial fluid collection. No obvious intrinsic temporal lobe abnormality. Pituitary gland suprasellar region normal. Midline structures intact. Vascular: Major intracranial vascular flow voids grossly maintained at the skull base. Skull and upper cervical spine:  Craniocervical junction normal. Upper cervical spine within normal limits. Bone marrow signal intensity normal. No scalp soft tissue abnormality. Sinuses/Orbits: Globes and orbital soft tissues within normal limits. Paranasal sinuses are largely clear. No significant mastoid effusion. Inner ear structures grossly normal. Other: None. MRA HEAD FINDINGS ANTERIOR CIRCULATION: Examination moderately degraded by motion artifact. Visualized distal cervical segments of the internal carotid arteries are widely patent with symmetric antegrade flow. Petrous, cavernous, and supraclinoid ICAs widely patent without appreciable flow-limiting stenosis. A1 segments patent bilaterally. Normal anterior communicating artery. Anterior cerebral arteries markedly limited assessment due to motion, but are grossly patent to their distal aspects. No M1 stenosis or occlusion. Negative MCA bifurcations. Distal MCA branches markedly limited in assessment due to motion, but are grossly perfused and symmetric. POSTERIOR CIRCULATION: Vertebral arteries patent to the vertebrobasilar junction without stenosis. Left vertebral artery dominant. Patent right PICA. Left PICA not seen. Basilar widely patent to its distal aspect without stenosis. Superior cerebral arteries patent bilaterally. Right PCA primarily supplied via the basilar. Fetal type  origin left PCA. Both PCAs well perfused to their distal aspects without stenosis. No appreciable aneurysm or other vascular abnormality. IMPRESSION: MRI HEAD IMPRESSION: 1. Motion degraded exam. 2. Patchy multifocal acute ischemic nonhemorrhagic infarcts involving the periventricular white matter of the corona radiata and centrum semi ovale bilaterally. No significant mass effect. 3. Otherwise grossly normal brain MRI for age. MRA HEAD IMPRESSION: 1. Technically limited exam due to motion. 2. Grossly negative intracranial MRA. No large vessel occlusion, hemodynamically significant stenosis, or other acute vascular abnormality. Electronically Signed   By: Jeannine Boga M.D.   On: 06/03/2019 00:51   MR BRAIN WO CONTRAST  Result Date: 06/03/2019 CLINICAL DATA:  Initial evaluation for acute altered mental status. EXAM: MRI HEAD WITHOUT CONTRAST MRA HEAD WITHOUT CONTRAST TECHNIQUE: Multiplanar, multiecho pulse sequences of the brain and surrounding structures were obtained without intravenous contrast. Angiographic images of the head were obtained using MRA technique without contrast. COMPARISON:  Prior CT from 05/30/2019. FINDINGS: MRI HEAD FINDINGS Brain: Examination moderately degraded by motion artifact. Cerebral volume within normal limits for age. Patchy multifocal areas of restricted diffusion seen involving the periventricular white matter of the corona radiata and centrum semi ovale bilaterally, right slightly worse than left. Largest area of infarction seen on the right and measures up to 2.1 cm. No associated hemorrhage or mass effect. No other evidence for acute or subacute ischemia. Gray-white matter differentiation otherwise maintained. No encephalomalacia to suggest chronic cortical infarction. 8 mm focus of susceptibility artifact noted at the periventricular white matter of the right centrum semi ovale, consistent with a chronic microhemorrhage. No mass lesion, midline shift or mass effect.  No hydrocephalus. No extra-axial fluid collection. No obvious intrinsic temporal lobe abnormality. Pituitary gland suprasellar region normal. Midline structures intact. Vascular: Major intracranial vascular flow voids grossly maintained at the skull base. Skull and upper cervical spine: Craniocervical junction normal. Upper cervical spine within normal limits. Bone marrow signal intensity normal. No scalp soft tissue abnormality. Sinuses/Orbits: Globes and orbital soft tissues within normal limits. Paranasal sinuses are largely clear. No significant mastoid effusion. Inner ear structures grossly normal. Other: None. MRA HEAD FINDINGS ANTERIOR CIRCULATION: Examination moderately degraded by motion artifact. Visualized distal cervical segments of the internal carotid arteries are widely patent with symmetric antegrade flow. Petrous, cavernous, and supraclinoid ICAs widely patent without appreciable flow-limiting stenosis. A1 segments patent bilaterally. Normal anterior communicating artery. Anterior cerebral arteries markedly limited assessment due to motion, but are grossly patent to their  distal aspects. No M1 stenosis or occlusion. Negative MCA bifurcations. Distal MCA branches markedly limited in assessment due to motion, but are grossly perfused and symmetric. POSTERIOR CIRCULATION: Vertebral arteries patent to the vertebrobasilar junction without stenosis. Left vertebral artery dominant. Patent right PICA. Left PICA not seen. Basilar widely patent to its distal aspect without stenosis. Superior cerebral arteries patent bilaterally. Right PCA primarily supplied via the basilar. Fetal type origin left PCA. Both PCAs well perfused to their distal aspects without stenosis. No appreciable aneurysm or other vascular abnormality. IMPRESSION: MRI HEAD IMPRESSION: 1. Motion degraded exam. 2. Patchy multifocal acute ischemic nonhemorrhagic infarcts involving the periventricular white matter of the corona radiata and  centrum semi ovale bilaterally. No significant mass effect. 3. Otherwise grossly normal brain MRI for age. MRA HEAD IMPRESSION: 1. Technically limited exam due to motion. 2. Grossly negative intracranial MRA. No large vessel occlusion, hemodynamically significant stenosis, or other acute vascular abnormality. Electronically Signed   By: Jeannine Boga M.D.   On: 06/03/2019 00:51   US Carotid Bilateral (at [facility] and [facility] only)  Result Date: 06/03/2019 CLINICAL DATA:  48 year old male with acute altered mental status, possible stroke EXAM: BILATERAL CAROTID DUPLEX ULTRASOUND TECHNIQUE: Pearline Cables scale imaging, color Doppler and duplex ultrasound were performed of bilateral carotid and vertebral arteries in the neck. COMPARISON:  Brain MRI 06/03/2019 FINDINGS: Criteria: Quantification of carotid stenosis is based on velocity parameters that correlate the residual internal carotid diameter with NASCET-based stenosis levels, using the diameter of the distal internal carotid lumen as the denominator for stenosis measurement. The following velocity measurements were obtained: RIGHT ICA: 103/36 cm/sec CCA: 123XX123 cm/sec SYSTOLIC ICA/CCA RATIO:  1.1 ECA:  171 cm/sec LEFT ICA: 79/24 cm/sec CCA: [id number] cm/sec SYSTOLIC ICA/CCA RATIO:  0.6 ECA:  106 cm/sec RIGHT CAROTID ARTERY: Mild heterogeneous atherosclerotic plaque in the proximal internal carotid artery. By peak systolic velocity criteria, the estimated stenosis remains less than 50%. RIGHT VERTEBRAL ARTERY:  Patent with normal antegrade flow. LEFT CAROTID ARTERY: No significant atherosclerotic plaque or evidence of stenosis in the internal carotid artery. LEFT VERTEBRAL ARTERY:  Patent with normal antegrade flow. IMPRESSION: Mild (1-49%) stenosis proximal right internal carotid artery secondary to heterogenous atherosclerotic plaque. No significant atherosclerotic plaque or evidence of stenosis in the left internal carotid artery. The vertebral arteries are patent with  normal antegrade flow. Electronically Signed   By: Jacqulynn Cadet M.D.   On: 06/03/2019 10:40   DG CHEST PORT 1 VIEW  Result Date: 05/15/2019 CLINICAL DATA:  OG tube placement.  Intubation. EXAM: PORTABLE CHEST 1 VIEW COMPARISON:  05/16/2019. FINDINGS: Endotracheal tube noted with its tip 4 cm above the carina. NG tube noted with tip below left hemidiaphragm. Dual-lumen right IJ catheter with tip over SVC. Mild infiltrates noted over the left lung and over the right lung base. No significant interim change. Small left pleural effusion again noted. No pneumothorax. IMPRESSION: 1. Endotracheal tube noted with tip 4 cm above the carina. NG tube noted with tip below left hemidiaphragm. Dual-lumen right IJ catheter with tip over SVC. 2. Mild infiltrates noted over the left lung and over the right lung base. No significant interim change. Small left pleural effusion again noted. Electronically Signed   By: Marcello Moores  Register   On: 05/20/2019 12:20   DG Chest Portable 1 View  Result Date: 05/24/2019 CLINICAL DATA:  Loss of consciousness EXAM: PORTABLE CHEST 1 VIEW COMPARISON:  June 02, 2019 FINDINGS: Again noted is mild cardiomegaly. A right-sided central venous catheter seen  with the tip at the superior cavoatrial junction. There is prominence of the pulmonary vasculature. There is mildly increased hazy airspace opacity seen at the left lung base with pleural thickening. No acute osseous abnormality. 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 IMPRESSION: Pulmonary vascular congestion and mild cardiomegaly. Unchanged chronic hazy airspace opacity at the left lung base/pleural thickening. Electronically Signed   By: Prudencio Pair M.D.   On: 06/12/2019 20:04   ECHOCARDIOGRAM COMPLETE BUBBLE STUDY  Result Date: 06/03/2019   ECHOCARDIOGRAM REPORT   Patient Name:   Jake Gomez Date of Exam: 06/03/2019 Medical Rec #:  [id number]    Height:       68.0 in Accession #:    [id number]   Weight:       246.0 lb Date of  Birth:  March 26, 1971    BSA:          2.23 m Patient Age:    48 years     BP:           111/37 mmHg Patient Gender: M            HR:           97 bpm. Exam Location:  [facility] Procedure: 2D Echo Indications:     STROKE 434.91/I63.9  History:         Patient has no prior history of Echocardiogram examinations.                  Risk Factors:Hypertension and Diabetes. ESRD.  Sonographer:     Avanell Shackleton Referring Phys:  [place] Diagnosing Phys: Serafina Royals MD  Sonographer Comments: Technically challenging study due to limited acoustic windows, Technically difficult study due to poor echo windows, suboptimal parasternal window and suboptimal apical window. Image acquisition challenging due to patient body habitus. PT COULD NOT TURN ON HIS LEFT SIDE. BUBBLE STUDY WAS ATTEMPTED TWICE IMPRESSIONS  1. Left ventricular ejection fraction, by visual estimation, is 60 to 65%. The left ventricle has normal function. There is no left ventricular hypertrophy.  2. The left ventricle has no regional wall motion abnormalities.  3. Global right ventricle has normal systolic function.The right ventricular size is normal. No increase in right ventricular wall thickness.  4. Left atrial size was normal.  5. Right atrial size was normal.  6. The mitral valve is normal in structure. Mild mitral valve regurgitation.  7. The tricuspid valve is normal in structure. Tricuspid valve regurgitation is mild.  8. The aortic valve is normal in structure. Aortic valve regurgitation is mild.  9. The pulmonic valve was normal in structure. Pulmonic valve regurgitation is not visualized. FINDINGS  Left Ventricle: Left ventricular ejection fraction, by visual estimation, is 60 to 65%. The left ventricle has normal function. The left ventricle has no regional wall motion abnormalities. There is no left ventricular hypertrophy. Right Ventricle: The right ventricular size is normal. No increase in right ventricular wall thickness. Global RV  systolic function is has normal systolic function. Left Atrium: Left atrial size was normal in size. Right Atrium: Right atrial size was normal in size Pericardium: There is no evidence of pericardial effusion. Mitral Valve: The mitral valve is normal in structure. Mild mitral valve regurgitation. Tricuspid Valve: The tricuspid valve is normal in structure. Tricuspid valve regurgitation is mild. Aortic Valve: The aortic valve is normal in structure. Aortic valve regurgitation is mild. Aortic valve mean gradient measures 8.0 mmHg. Aortic valve peak gradient measures 12.7 mmHg. Aortic valve area, by VTI measures  2.41 cm. Pulmonic Valve: The pulmonic valve was normal in structure. Pulmonic valve regurgitation is not visualized. Pulmonic regurgitation is not visualized. Aorta: The aortic root, ascending aorta and aortic arch are all structurally normal, with no evidence of dilitation or obstruction. IAS/Shunts: No atrial level shunt detected by color flow Doppler. Agitated saline contrast was given intravenously to evaluate for intracardiac shunting.  LEFT VENTRICLE PLAX 2D LVIDd:         4.74 cm LVIDs:         2.81 cm LV PW:         1.20 cm LV IVS:        1.47 cm LVOT diam:     2.00 cm LV SV:         75 ml LV SV Index:   31.60 LVOT Area:     3.14 cm  IVC IVC diam: 2.10 cm LEFT ATRIUM            Index LA diam:      5.30 cm  2.37 cm/m LA Vol (A4C): 114.0 ml 51.08 ml/m  AORTIC VALVE AV Area (Vmax):    2.72 cm AV Area (Vmean):   2.51 cm AV Area (VTI):     2.41 cm AV Vmax:           178.00 cm/s AV Vmean:          129.000 cm/s AV VTI:            0.319 m AV Peak Grad:      12.7 mmHg AV Mean Grad:      8.0 mmHg LVOT Vmax:         154.00 cm/s LVOT Vmean:        103.000 cm/s LVOT VTI:          0.245 m LVOT/AV VTI ratio: 0.77  AORTA Ao Root diam: 3.10 cm MITRAL VALVE MV Area (PHT): 7.16 cm              SHUNTS MV PHT:        30.74 msec            Systemic VTI:  0.24 m MV Decel Time: 106 msec              Systemic Diam:  2.00 cm MV E velocity: 160.00 cm/s 103 cm/s MV A velocity: 171.00 cm/s 70.3 cm/s MV E/A ratio:  0.94        1.5  Serafina Royals MD Electronically signed by Serafina Royals MD Signature Date/Time: 06/03/2019/2:08:13 PM    Final     Assessment: 48 y.o. male with a history of DM, HTN and ESRD who overnight had an episode of syncope and seizure like activity.  Patient with multiple metabolic abnormalities/infection at presentation that may have provoked seizure like activity but MRI imaging performed as well and on review shows multiple acute, small infarcts bilaterally.  MRA unremarkable. Embolic etiology likely.  Can not rule out septic emboli.  Patient on no antiplatelet therapy prior to admission.   Carotid dopplers show no evidence of hemodynamically significant stenosis.  Echocardiogram pending.    Stroke Risk Factors - diabetes mellitus and hypertension   - Respiratory failure possibly as per primary team - Con't ASA and Plavix tehrapy - Echo reviewed no clear signs of thrombus - Would consider TEE when more stable as septic emboli can't be ruled out - EEG pending - Will follow

## 2019-06-04 NOTE — Consult Note (Signed)
Pharmacy Antibiotic Note  Jake Gomez is a 48 y.o. male with a medical history of type 2 diabetes mellitus, hypertension, obesity, and ESRD on HD MWF admitted on 06/01/2019 with sepsis. Patient with chills during HD session last Monday and the session was stopped mid-way. After he traveled to [place] and missed HD on [facility]. Presented to [facility] ED 12/19 with generalized malaise. He was hyperkalemic and went for HD during which he started experiencing chills. Pharmacy has been consulted for vancomycin and piperacillin/tazobactam dosing.  Patient has Tmax of 100.2. Leukopenic on admission now with leukocytosis. Thrombocytopenic- SQ heparin d/c. PCT > 150 although confounded by ESRD. There is concern for catheter related infection. Imaging concerning for left lower lobe pneumonia. MRI with multiple, acute, small infarcts bilaterally- suspected embolic etiology per chart- septic emboli not ruled out. ECHO completed today.  Patient is intubated 12/21.  Per nephrology patient will not go to HD today due to hemodynamic instability (requiring pressors) and will begin CRRT instead.  PermCath is being removed as this may be an infectious source, and patient will be placed on temporary vascular access for CRRT  Plan: Will order piperacillin-tazobactam 3.375 g q6 hours (CRRT dose) Will order vanc 1 g q 24 hours for maintenance.  Height: 5\' 8"  (172.7 cm) Weight: 246 lb (111.6 kg) IBW/kg (Calculated) : 68.4  Temp (24hrs), Avg:99.4 F (37.4 C), Min:98.8 F (37.1 C), Max:100.2 F (37.9 C)  Recent Labs  Lab 05/30/2019 0048 05/29/2019 0235 06/05/2019 0456 06/03/2019 1941 06/03/19 1136 06/03/19 1820 06/01/2019 0343  WBC 7.6  --  6.4 1.8* 19.6*  --  33.0*  CREATININE 15.54*  --  15.46* 8.76* 10.34*  --  12.14*  LATICACIDVEN  --  1.0 1.1  --   --   --   --   VANCORANDOM  --   --   --   --   --  5  --     Estimated Creatinine Clearance: 9.2 mL/min (A) (by C-G formula based on SCr of 12.14 mg/dL (H)).    No Known  Allergies  Antimicrobials this admission: Pip/tazo 12/19 >>  Vancomycin 12/19 >>   Dose adjustments this admission: N/A  Microbiology results: 12/20 UCx sent 12/20 MRSA PCR neg 12/19 BCx: Ng x 2 days 12/19 SARS-CoV-2/Influenza A/B: negative  Thank you for allowing pharmacy to be a part of this patient's care.  Gerald Dexter, PharmD Pharmacy Resident  05/26/2019 2:32 PM

## 2019-06-04 NOTE — Progress Notes (Signed)
Dr. Lucky Cowboy at bedside to remove permanent vascular access and place temporary vascular access for CRRT/hemodialysis.

## 2019-06-04 NOTE — Progress Notes (Signed)
Ch provided social support for the pt's sis ter that awaited being bedside with the pt who was having an IV lined placed. Pt is a 77 YOM that receives dialysis treatment on Tu/Th/Sa normally. Pt's sister,Yanet Murie came to receive an update on the pt's status. Pt came to the ER on 12.18.20. As reported by the care team and the sister, the pt had a stroke. The pt was somewhat responsive during times that he would be taken off the BiPap but the sister was concerned now that the pt was intubated. As reported by the pt's sister, the pt had to return emergently from a trip to [place] for the funeral of an uncle (took a [employer] writer). The pt was too sick to attend the actual funeral but did attend the wake which lasted over night when he first begun to feel weak, nauseous, and lethargic after receiving dialysis at the facility in [place]. Pt was COVID+ in June/July which lead to his parents being exposed. The pt's mom stayed 3 months in the hospital/rehab from the complications from [place] and the father, who is a cancer pt was able to quarantine without much problems related to the virus. The ch helped the sister to realize the importance of balancing self-care with familial obligations and encouraged her to request a tele-visit with the nurse. The pt sis is the main point of contact but shew works at a [employer]. The pt has lost ~ 5 family members to [place] as reported by the pt sister as she expressed her grief and lament about the struggles the family has had to deal with and the limitations that are impacting their quality of life due to the demands of care giving for ill parents and now the pt. The ch facilitated the pt's sister receiving an update from the pt's nurse(s) and the option to receive a tele-visit call.  F/u recommended.    05/18/2019 1300  Clinical Encounter Type  Visited With Family;Health care provider  Visit Type Psychological support;Spiritual support;Social support;Critical Care  Spiritual  Encounters  Spiritual Needs Emotional;Grief support  Stress Factors  Patient Stress Factors Health changes;Exhausted;Family relationships;Financial concerns;Lack of knowledge;Major life changes;Loss of control;Loss  Family Stress Factors Loss;Loss of control;Major life changes

## 2019-06-04 NOTE — Procedures (Signed)
Central Venous Catheter Placement:TRIPLE LUMEN Indication: Patient receiving vesicant or irritant drug.; Patient receiving intravenous therapy for longer than 5 days.; Patient has limited or no vascular access.   Consent:emergent    Hand washing performed prior to starting the procedure.   Procedure:   An active timeout was performed and correct patient, name, & ID confirmed.   Patient was positioned correctly for central venous access.  Patient was prepped using strict sterile technique including chlorohexadine preps, sterile drape, sterile gown and sterile gloves.    The area was prepped, draped and anesthetized in the usual sterile manner. Patient comfort was obtained.    A triple lumen catheter was placed in RT femoral Vein There was good blood return, catheter caps were placed on lumens, catheter flushed easily, the line was secured and a sterile dressing and BIO-PATCH applied.   Ultrasound was used to visualize vasculature and guidance of needle.   Number of Attempts: 1 Complications:none Estimated Blood Loss: none Chest Radiograph indicated and ordered.  Operator: Lain Tetterton.   Corrin Parker, M.D.  [name]  Medical Director Christine Director [facility] Cardio-Pulmonary Department

## 2019-06-04 NOTE — Progress Notes (Signed)
Placed patient back on bipap earlier due to breathing.  bipap noted to be in avaps mode. vt 500 r14 epap pmax 25 pmin 10 rise 3 ti .80. patient tolerateing well

## 2019-06-04 NOTE — Op Note (Signed)
  OPERATIVE NOTE   PROCEDURE: 1. Ultrasound guidance for vascular access left femoral vein 2. Placement of a 30 cm triple lumen dialysis catheter left femoral vein  PRE-OPERATIVE DIAGNOSIS: 1.  ESRD, sepsis, suspected PermCath infection requiring removal and need for a temporary dialysis catheter for CRRT  POST-OPERATIVE DIAGNOSIS: Same  SURGEON: Leotis Pain, MD  ASSISTANT(S): None  ANESTHESIA: local  ESTIMATED BLOOD LOSS: Minimal   FINDING(S): 1. None  SPECIMEN(S): None  INDICATIONS:  Patient is a 48 y.o.male who presents with sepsis and suspected PermCath infection.  His PermCath is being removed and he will need a temporary dialysis catheter to get CRRT until he is more stable.  He does have an extremity access that can be used once he restarts regular hemodialysis.  Risks and benefits were discussed, and informed consent was obtained.  DESCRIPTION: After obtaining full informed written consent, the patient was laid flat in the bed. The left groin was sterilely prepped and draped in a sterile surgical field was created. The left femoral vein was visualized with ultrasound and found to be widely patent. It was then accessed under direct guidance without difficulty with a Seldinger needle and a permanent image was recorded. A J-wire was then placed. After skin nick and dilatation, a 30 cm triple lumen dialysis catheter was placed over the wire and the wire was removed. The lumens withdrew dark red nonpulsatile blood and flushed easily with sterile saline. The catheter was secured to the skin with 3 nylon sutures. Sterile dressing was placed.  COMPLICATIONS: None  CONDITION: Stable  Leotis Pain 05/22/2019 2:02 PM  This note was created with Dragon Medical transcription system. Any errors in dictation are purely unintentional.

## 2019-06-04 NOTE — Consult Note (Signed)
[facility] Vascular Consult Note  MRN : [id number]  Jake Gomez is a 48 y.o. ([birth year]) male who presents with chief complaint of  Chief Complaint  Patient presents with  . "Not Feeling Right"  .  History of Present Illness: I am asked to see the patient by Dr. Juleen China for possible PermCath infection.  The patient has a markedly elevated white blood cell count over 30,000 and signs of sepsis.  He is now unstable and in the ICU on pressors with respiratory failure requiring intubation.  He will need to begin CRRT and we will have to place a temporary dialysis catheter for that and his PermCath needs to be removed for fear of infection.  He was scheduled to have this taken out as an outpatient but did not show up for his Covid testing or his procedure.  Compliance has been an issue in the past.  He is unable to provide any history as he is intubated at this time.  Current Facility-Administered Medications  Medication Dose Route Frequency Provider Last Rate Last Admin  .  stroke: mapping our early stages of recovery book   Does not apply Once Sharion Settler, NP      . 0.9 %  sodium chloride infusion  250 mL Intravenous Continuous Wyvonnia Dusky, MD      . acetaminophen (TYLENOL) tablet 650 mg  650 mg Oral Q6H PRN Mansy, Jan A, MD       Or  . acetaminophen (TYLENOL) suppository 650 mg  650 mg Rectal Q6H PRN Mansy, Arvella Merles, MD   650 mg at 06/03/19 0440  . aspirin suppository 300 mg  300 mg Rectal Daily Sharion Settler, NP   300 mg at 06/03/19 1035   Or  . aspirin EC tablet 325 mg  325 mg Oral Daily Sharion Settler, NP      . atorvastatin (LIPITOR) tablet 80 mg  80 mg Oral q morning - 10a Mansy, Jan A, MD      . calcium gluconate 2 g/ 100 mL sodium chloride IVPB  2 g Intravenous Once Kolluru, Sarath, MD      . Chlorhexidine Gluconate Cloth 2 % PADS 6 each  6 each Topical Q0600 Bhutani, Manpreet S, MD      . fentaNYL 2552mcg in NS 265mL (77mcg/ml)  infusion-PREMIX  0-400 mcg/hr Intravenous Continuous Flora Lipps, MD 2.5 mL/hr at 05/15/2019 1215 25 mcg/hr at 05/28/2019 1215  . heparin injection 1,000-6,000 Units  1,000-6,000 Units CRRT PRN Kolluru, Sarath, MD      . insulin aspart (novoLOG) injection 0-9 Units  0-9 Units Subcutaneous TID WC Wyvonnia Dusky, MD   1 Units at 05/22/2019 [phone]  . ipratropium-albuterol (DUONEB) 0.5-2.5 (3) MG/3ML nebulizer solution 3 mL  3 mL Nebulization Q6H PRN Wyvonnia Dusky, MD      . midazolam (VERSED) injection 2 mg  2 mg Intravenous Q4H PRN Flora Lipps, MD      . norepinephrine (LEVOPHED) 16 mg in 211mL premix infusion  0-40 mcg/min Intravenous Titrated Flora Lipps, MD 4.69 mL/hr at 06/02/2019 1216 5 mcg/min at 05/18/2019 1216  . ondansetron (ZOFRAN) tablet 4 mg  4 mg Oral Q6H PRN Mansy, Jan A, MD       Or  . ondansetron [facility]) injection 4 mg  4 mg Intravenous Q6H PRN Mansy, Jan A, MD      . piperacillin-tazobactam (ZOSYN) IVPB 3.375 g  3.375 g Intravenous Q12H Benita Gutter, RPH 12.5 mL/hr at 05/16/2019 1200  Rate Verify at 05/18/2019 1200  . prismasol BGK 2/2.5 dialysis solution   CRRT Continuous Kolluru, Sarath, MD      . prismasol BGK 2/2.5 replacement solution   CRRT Continuous Kolluru, Sarath, MD      . sodium chloride 0.9 % primer fluid for CRRT   CRRT PRN Kolluru, Sarath, MD      . traZODone (DESYREL) tablet 25 mg  25 mg Oral QHS PRN Mansy, Arvella Merles, MD        Past Medical History:  Diagnosis Date  . Diabetes mellitus without complication ([place])   . Hypertension   . Renal disorder   . Sleep apnea    can't afford CPAP    Past Surgical History:  Procedure Laterality Date  . AV FISTULA PLACEMENT Left 01/31/2019   Procedure: ARTERIOVENOUS (AV) FISTULA CREATION ( BRACHIOCEPHALIC );  Surgeon: Algernon Huxley, MD;  Location: [facility] ORS;  Service: Vascular;  Laterality: Left;  . DIALYSIS/PERMA CATHETER INSERTION N/A 07/19/2018   Procedure: DIALYSIS/PERMA CATHETER INSERTION;  Surgeon: Algernon Huxley, MD;   Location: [place] [facility];  Service: Cardiovascular;  Laterality: N/A;  . DIALYSIS/PERMA CATHETER INSERTION N/A 09/13/2018   Procedure: DIALYSIS/PERMA CATHETER INSERTION;  Surgeon: Katha Cabal, MD;  Location: [place] [facility];  Service: Cardiovascular;  Laterality: N/A;  . peritonal dialysis    . REMOVAL OF A DIALYSIS CATHETER N/A 07/17/2018   Procedure: REMOVAL OF A DIALYSIS CATHETER;  Surgeon: Algernon Huxley, MD;  Location: [facility] ORS;  Service: Vascular;  Laterality: N/A;     Social History   Tobacco Use  . Smoking status: Former Smoker    Quit date: 11/14/2009    Years since quitting: 9.5  . Smokeless tobacco: Former Systems developer    Quit date: 07/16/2009  Substance Use Topics  . Alcohol use: No  . Drug use: Not Currently    Comment: history 10 years ago   No urine production     Family History  Problem Relation Age of Onset  . Thyroid disease Mother   . Stroke Father   . Cancer Father   . Diabetes Father     No Known Allergies   REVIEW OF SYSTEMS (Negative unless checked) Unable to obtain due to patient's medical condition and currently being intubated  Physical Examination  Vitals:   05/20/2019 1210 05/21/2019 1215 06/07/2019 1220 06/09/2019 1225  BP: (!) 88/42 (!) 92/45 (!) 126/59 (!) 171/62  Pulse: 83 85 91 100  Resp: 16 16 17 20   Temp:      TempSrc:      SpO2: 93% 94% 92% 95%  Weight:      Height:       Body mass index is 37.4 kg/m. Gen:  WD/WN, NAD Head: [place]/AT, No temporalis wasting.  Ear/Nose/Throat: Hearing grossly intact, nares w/o erythema or drainage, oropharynx w/o Erythema/Exudate Eyes: Sclera non-icteric, conjunctiva clear Neck: Trachea midline.  No JVD.  Pulmonary: Coarse breath sounds present on the ventilator.  Tachypnea is present. Cardiac: Tachycardic Vascular: Thrill is present in his left upper arm AV fistula Vessel Right Left  Radial Palpable Palpable                                   Gastrointestinal: soft,  non-tender/non-distended. No guarding/reflex.  Musculoskeletal: M/S 5/5 throughout.  Extremities without ischemic changes.  No deformity or atrophy.  Mild lower extremity edema. Neurologic: Occult to assess as the patient  is intubated and sedated Psychiatric: Unable to assess as the patient is intubated and sedated Dermatologic: No rashes or ulcers noted.  No cellulitis or open wounds.       CBC Lab Results  Component Value Date   WBC 33.0 (H) 05/20/2019   HGB 8.6 (L) 05/25/2019   HCT 27.3 (L) 05/22/2019   MCV 102.2 (H) 06/06/2019   PLT 58 (L) 06/14/2019    BMET    Component Value Date/Time   NA 137 06/09/2019 0343   NA 142 04/30/2014 1244   K 6.1 (H) 05/17/2019 0343   K 4.3 04/30/2014 1244   CL 96 (L) 06/14/2019 0343   CL 109 (H) 04/30/2014 1244   CO2 24 05/15/2019 0343   CO2 25 04/30/2014 1244   GLUCOSE 142 (H) 06/06/2019 0343   GLUCOSE 94 04/30/2014 1244   BUN 93 (H) 05/16/2019 0343   BUN 61 (H) 04/30/2014 1244   CREATININE 12.14 (H) 05/21/2019 0343   CREATININE 4.40 (H) 04/30/2014 1244   CALCIUM 6.2 (LL) 06/06/2019 0343   CALCIUM 7.8 (L) 04/30/2014 1244   GFRNONAA 4 (L) 05/15/2019 0343   GFRNONAA 16 (L) 04/30/2014 1244   GFRAA 5 (L) 05/26/2019 0343   GFRAA 19 (L) 04/30/2014 1244   Estimated Creatinine Clearance: 9.2 mL/min (A) (by C-G formula based on SCr of 12.14 mg/dL (H)).  COAG Lab Results  Component Value Date   INR 1.0 01/26/2019   INR 1.0 11/15/2018   INR 1.07 07/17/2018    Radiology DG Chest 1 View  Result Date: 05/29/2019 CLINICAL DATA:  Chest pain.  Missed dialysis. EXAM: CHEST  1 VIEW COMPARISON:  08/16/2018 FINDINGS: Right-sided dialysis catheter tip at the atrial caval junction. Unchanged cardiomegaly. Left pleural effusion versus pleural thickening and scarring, unchanged from prior exam. Overall low lung volumes. Probable vascular congestion. No pneumothorax. IMPRESSION: Low lung volumes. Cardiomegaly with probable vascular congestion.  Chronic left pleural effusion/thickening and basilar scarring. Electronically Signed   By: Keith Rake M.D.   On: 05/15/2019 02:40   DG Abd 1 View  Result Date: 05/27/2019 CLINICAL DATA:  OG tube placement EXAM: ABDOMEN - 1 VIEW COMPARISON:  None. FINDINGS: OG tube tip is at the GE junction with the side port in the distal esophagus. Nonobstructive bowel gas pattern. IMPRESSION: OG tube tip at the GE junction. This could be advanced several cm into the stomach. Electronically Signed   By: Rolm Baptise M.D.   On: 06/12/2019 12:19   CT Head Wo Contrast  Result Date: 06/01/2019 CLINICAL DATA:  48 year old male with syncope. Patient with end-stage renal disease on dialysis. EXAM: CT HEAD WITHOUT CONTRAST TECHNIQUE: Contiguous axial images were obtained from the base of the skull through the vertex without intravenous contrast. COMPARISON:  11/17/2010 CT FINDINGS: Brain: No evidence of acute infarction, hemorrhage, hydrocephalus, extra-axial collection or mass lesion/mass effect. Vascular: Mild carotid atherosclerotic calcifications noted. Skull: Normal. Negative for fracture or focal lesion. Sinuses/Orbits: No acute finding. Other: None. IMPRESSION: No evidence of acute intracranial abnormality. Electronically Signed   By: Margarette Canada M.D.   On: 06/10/2019 20:20   CT ANGIO CHEST PE W OR WO CONTRAST  Result Date: 06/03/2019 CLINICAL DATA:  Shortness of breath. Missed recent dialysis sessions. EXAM: CT ANGIOGRAPHY CHEST WITH CONTRAST TECHNIQUE: Multidetector CT imaging of the chest was performed using the standard protocol during bolus administration of intravenous contrast. Multiplanar CT image reconstructions and MIPs were obtained to evaluate the vascular anatomy. CONTRAST:  183mL OMNIPAQUE IOHEXOL 350 MG/ML SOLN  COMPARISON:  Chest x-ray from yesterday. FINDINGS: Cardiovascular: Suboptimal evaluation of the segmental pulmonary arteries due to contrast bolus timing and motion artifact. No evidence of  central or lobar pulmonary embolism. Unchanged mild cardiomegaly. No pericardial effusion. No thoracic aortic aneurysm or dissection. Coronary, aortic arch, and branch vessel atherosclerotic vascular disease. Dense calcification of the mitral annulus. Mediastinum/Nodes: No enlarged mediastinal, hilar, or axillary lymph nodes. Thyroid gland, trachea, and esophagus demonstrate no significant findings. Lungs/Pleura: Trace left pleural effusion. Consolidation atelectasis in the left lower lobe. Minimal subsegmental atelectasis in the right lower lobe. Chronic pleuroparenchymal scarring in the left upper lobe. No pneumothorax. Upper Abdomen: No acute abnormality. Musculoskeletal: No chest wall abnormality. No acute or significant osseous findings. Review of the MIP images confirms the above findings. IMPRESSION: 1. No central or lobar pulmonary embolism. Suboptimal opacification of the segmental pulmonary arteries due to contrast bolus timing and motion artifact. 2. Left lower lobe pneumonia.  Trace left pleural effusion. 3.  Aortic atherosclerosis (ICD10-I70.0). Electronically Signed   By: Titus Dubin M.D.   On: 06/03/2019 12:34   MR ANGIO HEAD WO CONTRAST  Result Date: 06/03/2019 CLINICAL DATA:  Initial evaluation for acute altered mental status. EXAM: MRI HEAD WITHOUT CONTRAST MRA HEAD WITHOUT CONTRAST TECHNIQUE: Multiplanar, multiecho pulse sequences of the brain and surrounding structures were obtained without intravenous contrast. Angiographic images of the head were obtained using MRA technique without contrast. COMPARISON:  Prior CT from 06/11/2019. FINDINGS: MRI HEAD FINDINGS Brain: Examination moderately degraded by motion artifact. Cerebral volume within normal limits for age. Patchy multifocal areas of restricted diffusion seen involving the periventricular white matter of the corona radiata and centrum semi ovale bilaterally, right slightly worse than left. Largest area of infarction seen on the  right and measures up to 2.1 cm. No associated hemorrhage or mass effect. No other evidence for acute or subacute ischemia. Gray-white matter differentiation otherwise maintained. No encephalomalacia to suggest chronic cortical infarction. 8 mm focus of susceptibility artifact noted at the periventricular white matter of the right centrum semi ovale, consistent with a chronic microhemorrhage. No mass lesion, midline shift or mass effect. No hydrocephalus. No extra-axial fluid collection. No obvious intrinsic temporal lobe abnormality. Pituitary gland suprasellar region normal. Midline structures intact. Vascular: Major intracranial vascular flow voids grossly maintained at the skull base. Skull and upper cervical spine: Craniocervical junction normal. Upper cervical spine within normal limits. Bone marrow signal intensity normal. No scalp soft tissue abnormality. Sinuses/Orbits: Globes and orbital soft tissues within normal limits. Paranasal sinuses are largely clear. No significant mastoid effusion. Inner ear structures grossly normal. Other: None. MRA HEAD FINDINGS ANTERIOR CIRCULATION: Examination moderately degraded by motion artifact. Visualized distal cervical segments of the internal carotid arteries are widely patent with symmetric antegrade flow. Petrous, cavernous, and supraclinoid ICAs widely patent without appreciable flow-limiting stenosis. A1 segments patent bilaterally. Normal anterior communicating artery. Anterior cerebral arteries markedly limited assessment due to motion, but are grossly patent to their distal aspects. No M1 stenosis or occlusion. Negative MCA bifurcations. Distal MCA branches markedly limited in assessment due to motion, but are grossly perfused and symmetric. POSTERIOR CIRCULATION: Vertebral arteries patent to the vertebrobasilar junction without stenosis. Left vertebral artery dominant. Patent right PICA. Left PICA not seen. Basilar widely patent to its distal aspect without  stenosis. Superior cerebral arteries patent bilaterally. Right PCA primarily supplied via the basilar. Fetal type origin left PCA. Both PCAs well perfused to their distal aspects without stenosis. No appreciable aneurysm or other vascular abnormality. IMPRESSION: MRI  HEAD IMPRESSION: 1. Motion degraded exam. 2. Patchy multifocal acute ischemic nonhemorrhagic infarcts involving the periventricular white matter of the corona radiata and centrum semi ovale bilaterally. No significant mass effect. 3. Otherwise grossly normal brain MRI for age. MRA HEAD IMPRESSION: 1. Technically limited exam due to motion. 2. Grossly negative intracranial MRA. No large vessel occlusion, hemodynamically significant stenosis, or other acute vascular abnormality. Electronically Signed   By: Jeannine Boga M.D.   On: 06/03/2019 00:51   MR BRAIN WO CONTRAST  Result Date: 06/03/2019 CLINICAL DATA:  Initial evaluation for acute altered mental status. EXAM: MRI HEAD WITHOUT CONTRAST MRA HEAD WITHOUT CONTRAST TECHNIQUE: Multiplanar, multiecho pulse sequences of the brain and surrounding structures were obtained without intravenous contrast. Angiographic images of the head were obtained using MRA technique without contrast. COMPARISON:  Prior CT from 05/26/2019. FINDINGS: MRI HEAD FINDINGS Brain: Examination moderately degraded by motion artifact. Cerebral volume within normal limits for age. Patchy multifocal areas of restricted diffusion seen involving the periventricular white matter of the corona radiata and centrum semi ovale bilaterally, right slightly worse than left. Largest area of infarction seen on the right and measures up to 2.1 cm. No associated hemorrhage or mass effect. No other evidence for acute or subacute ischemia. Gray-white matter differentiation otherwise maintained. No encephalomalacia to suggest chronic cortical infarction. 8 mm focus of susceptibility artifact noted at the periventricular white matter of the  right centrum semi ovale, consistent with a chronic microhemorrhage. No mass lesion, midline shift or mass effect. No hydrocephalus. No extra-axial fluid collection. No obvious intrinsic temporal lobe abnormality. Pituitary gland suprasellar region normal. Midline structures intact. Vascular: Major intracranial vascular flow voids grossly maintained at the skull base. Skull and upper cervical spine: Craniocervical junction normal. Upper cervical spine within normal limits. Bone marrow signal intensity normal. No scalp soft tissue abnormality. Sinuses/Orbits: Globes and orbital soft tissues within normal limits. Paranasal sinuses are largely clear. No significant mastoid effusion. Inner ear structures grossly normal. Other: None. MRA HEAD FINDINGS ANTERIOR CIRCULATION: Examination moderately degraded by motion artifact. Visualized distal cervical segments of the internal carotid arteries are widely patent with symmetric antegrade flow. Petrous, cavernous, and supraclinoid ICAs widely patent without appreciable flow-limiting stenosis. A1 segments patent bilaterally. Normal anterior communicating artery. Anterior cerebral arteries markedly limited assessment due to motion, but are grossly patent to their distal aspects. No M1 stenosis or occlusion. Negative MCA bifurcations. Distal MCA branches markedly limited in assessment due to motion, but are grossly perfused and symmetric. POSTERIOR CIRCULATION: Vertebral arteries patent to the vertebrobasilar junction without stenosis. Left vertebral artery dominant. Patent right PICA. Left PICA not seen. Basilar widely patent to its distal aspect without stenosis. Superior cerebral arteries patent bilaterally. Right PCA primarily supplied via the basilar. Fetal type origin left PCA. Both PCAs well perfused to their distal aspects without stenosis. No appreciable aneurysm or other vascular abnormality. IMPRESSION: MRI HEAD IMPRESSION: 1. Motion degraded exam. 2. Patchy  multifocal acute ischemic nonhemorrhagic infarcts involving the periventricular white matter of the corona radiata and centrum semi ovale bilaterally. No significant mass effect. 3. Otherwise grossly normal brain MRI for age. MRA HEAD IMPRESSION: 1. Technically limited exam due to motion. 2. Grossly negative intracranial MRA. No large vessel occlusion, hemodynamically significant stenosis, or other acute vascular abnormality. Electronically Signed   By: Jeannine Boga M.D.   On: 06/03/2019 00:51   US Carotid Bilateral (at [facility] and [facility] only)  Result Date: 06/03/2019 CLINICAL DATA:  48 year old male with acute altered mental status, possible  stroke EXAM: BILATERAL CAROTID DUPLEX ULTRASOUND TECHNIQUE: Pearline Cables scale imaging, color Doppler and duplex ultrasound were performed of bilateral carotid and vertebral arteries in the neck. COMPARISON:  Brain MRI 06/03/2019 FINDINGS: Criteria: Quantification of carotid stenosis is based on velocity parameters that correlate the residual internal carotid diameter with NASCET-based stenosis levels, using the diameter of the distal internal carotid lumen as the denominator for stenosis measurement. The following velocity measurements were obtained: RIGHT ICA: 103/36 cm/sec CCA: [id number] cm/sec SYSTOLIC ICA/CCA RATIO:  1.1 ECA:  171 cm/sec LEFT ICA: 79/24 cm/sec CCA: [id number] cm/sec SYSTOLIC ICA/CCA RATIO:  0.6 ECA:  106 cm/sec RIGHT CAROTID ARTERY: Mild heterogeneous atherosclerotic plaque in the proximal internal carotid artery. By peak systolic velocity criteria, the estimated stenosis remains less than 50%. RIGHT VERTEBRAL ARTERY:  Patent with normal antegrade flow. LEFT CAROTID ARTERY: No significant atherosclerotic plaque or evidence of stenosis in the internal carotid artery. LEFT VERTEBRAL ARTERY:  Patent with normal antegrade flow. IMPRESSION: Mild (1-49%) stenosis proximal right internal carotid artery secondary to heterogenous atherosclerotic plaque. No significant  atherosclerotic plaque or evidence of stenosis in the left internal carotid artery. The vertebral arteries are patent with normal antegrade flow. Electronically Signed   By: Jacqulynn Cadet M.D.   On: 06/03/2019 10:40   DG CHEST PORT 1 VIEW  Result Date: 05/18/2019 CLINICAL DATA:  OG tube placement.  Intubation. EXAM: PORTABLE CHEST 1 VIEW COMPARISON:  05/19/2019. FINDINGS: Endotracheal tube noted with its tip 4 cm above the carina. NG tube noted with tip below left hemidiaphragm. Dual-lumen right IJ catheter with tip over SVC. Mild infiltrates noted over the left lung and over the right lung base. No significant interim change. Small left pleural effusion again noted. No pneumothorax. IMPRESSION: 1. Endotracheal tube noted with tip 4 cm above the carina. NG tube noted with tip below left hemidiaphragm. Dual-lumen right IJ catheter with tip over SVC. 2. Mild infiltrates noted over the left lung and over the right lung base. No significant interim change. Small left pleural effusion again noted. Electronically Signed   By: Marcello Moores  Register   On: 05/22/2019 12:20   DG Chest Portable 1 View  Result Date: 06/03/2019 CLINICAL DATA:  Loss of consciousness EXAM: PORTABLE CHEST 1 VIEW COMPARISON:  June 02, 2019 FINDINGS: Again noted is mild cardiomegaly. A right-sided central venous catheter seen with the tip at the superior cavoatrial junction. There is prominence of the pulmonary vasculature. There is mildly increased hazy airspace opacity seen at the left lung base with pleural thickening. No acute osseous abnormality. 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 IMPRESSION: Pulmonary vascular congestion and mild cardiomegaly. Unchanged chronic hazy airspace opacity at the left lung base/pleural thickening. Electronically Signed   By: Prudencio Pair M.D.   On: 05/28/2019 20:04   ECHOCARDIOGRAM COMPLETE BUBBLE STUDY  Result Date: 06/03/2019   ECHOCARDIOGRAM REPORT   Patient Name:   ISAIYAH BALASH Date of Exam:  06/03/2019 Medical Rec #:  [id number]    Height:       68.0 in Accession #:    [id number]   Weight:       246.0 lb Date of Birth:  12/09/1970    BSA:          2.23 m Patient Age:    89 years     BP:           111/37 mmHg Patient Gender: M            HR:  97 bpm. Exam Location:  [facility] Procedure: 2D Echo Indications:     STROKE 434.91/I63.9  History:         Patient has no prior history of Echocardiogram examinations.                  Risk Factors:Hypertension and Diabetes. ESRD.  Sonographer:     Avanell Shackleton Referring Phys:  [place] Diagnosing Phys: Serafina Royals MD  Sonographer Comments: Technically challenging study due to limited acoustic windows, Technically difficult study due to poor echo windows, suboptimal parasternal window and suboptimal apical window. Image acquisition challenging due to patient body habitus. PT COULD NOT TURN ON HIS LEFT SIDE. BUBBLE STUDY WAS ATTEMPTED TWICE IMPRESSIONS  1. Left ventricular ejection fraction, by visual estimation, is 60 to 65%. The left ventricle has normal function. There is no left ventricular hypertrophy.  2. The left ventricle has no regional wall motion abnormalities.  3. Global right ventricle has normal systolic function.The right ventricular size is normal. No increase in right ventricular wall thickness.  4. Left atrial size was normal.  5. Right atrial size was normal.  6. The mitral valve is normal in structure. Mild mitral valve regurgitation.  7. The tricuspid valve is normal in structure. Tricuspid valve regurgitation is mild.  8. The aortic valve is normal in structure. Aortic valve regurgitation is mild.  9. The pulmonic valve was normal in structure. Pulmonic valve regurgitation is not visualized. FINDINGS  Left Ventricle: Left ventricular ejection fraction, by visual estimation, is 60 to 65%. The left ventricle has normal function. The left ventricle has no regional wall motion abnormalities. There is no left ventricular  hypertrophy. Right Ventricle: The right ventricular size is normal. No increase in right ventricular wall thickness. Global RV systolic function is has normal systolic function. Left Atrium: Left atrial size was normal in size. Right Atrium: Right atrial size was normal in size Pericardium: There is no evidence of pericardial effusion. Mitral Valve: The mitral valve is normal in structure. Mild mitral valve regurgitation. Tricuspid Valve: The tricuspid valve is normal in structure. Tricuspid valve regurgitation is mild. Aortic Valve: The aortic valve is normal in structure. Aortic valve regurgitation is mild. Aortic valve mean gradient measures 8.0 mmHg. Aortic valve peak gradient measures 12.7 mmHg. Aortic valve area, by VTI measures 2.41 cm. Pulmonic Valve: The pulmonic valve was normal in structure. Pulmonic valve regurgitation is not visualized. Pulmonic regurgitation is not visualized. Aorta: The aortic root, ascending aorta and aortic arch are all structurally normal, with no evidence of dilitation or obstruction. IAS/Shunts: No atrial level shunt detected by color flow Doppler. Agitated saline contrast was given intravenously to evaluate for intracardiac shunting.  LEFT VENTRICLE PLAX 2D LVIDd:         4.74 cm LVIDs:         2.81 cm LV PW:         1.20 cm LV IVS:        1.47 cm LVOT diam:     2.00 cm LV SV:         75 ml LV SV Index:   31.60 LVOT Area:     3.14 cm  IVC IVC diam: 2.10 cm LEFT ATRIUM            Index LA diam:      5.30 cm  2.37 cm/m LA Vol (A4C): 114.0 ml 51.08 ml/m  AORTIC VALVE AV Area (Vmax):    2.72 cm AV Area (Vmean):  2.51 cm AV Area (VTI):     2.41 cm AV Vmax:           178.00 cm/s AV Vmean:          129.000 cm/s AV VTI:            0.319 m AV Peak Grad:      12.7 mmHg AV Mean Grad:      8.0 mmHg LVOT Vmax:         154.00 cm/s LVOT Vmean:        103.000 cm/s LVOT VTI:          0.245 m LVOT/AV VTI ratio: 0.77  AORTA Ao Root diam: 3.10 cm MITRAL VALVE MV Area (PHT): 7.16 cm               SHUNTS MV PHT:        30.74 msec            Systemic VTI:  0.24 m MV Decel Time: 106 msec              Systemic Diam: 2.00 cm MV E velocity: 160.00 cm/s 103 cm/s MV A velocity: 171.00 cm/s 70.3 cm/s MV E/A ratio:  0.94        1.5  Serafina Royals MD Electronically signed by Serafina Royals MD Signature Date/Time: 06/03/2019/2:08:13 PM    Final       Assessment/Plan 1.  Sepsis.  PermCath infection is suspected particularly with a marked increase in white blood cell count.  May also have an aspiration pneumonitis.  PermCath will be removed today.  Tip can be sent for culture.  Hopefully, after he comes off of CRRT we can use his extremity access and not place a new catheter 2.  ESRD.  Unstable and now will need CRRT so we will place a temporary dialysis catheter as we remove his PermCath.  Once he comes off of CRRT, hopefully can use his extremity access for further dialysis. 3.  Hypotension.  Will need CRRT and not hemodialysis at this time. 4.  Diabetes.  An underlying cause of his renal failure and blood glucose control important in reducing the progression of atherosclerotic disease. Also, involved in wound healing. On appropriate medications.    Leotis Pain, MD  06/10/2019 1:32 PM    This note was created with Dragon medical transcription system.  Any error is purely unintentional

## 2019-06-04 NOTE — Consult Note (Signed)
Pharmacy Antibiotic Note  Jake Gomez is a 48 y.o. male with a medical history of type 2 diabetes mellitus, hypertension, obesity, and ESRD on HD MWF admitted on 06/01/2019 with sepsis. Patient with chills during HD session last Monday and the session was stopped mid-way. After he traveled to [place] and missed HD on [facility]. Presented to [facility] ED 12/19 with generalized malaise. He was hyperkalemic and went for HD during which he started experiencing chills. Pharmacy has been consulted for vancomycin and piperacillin/tazobactam dosing.  Patient is febrile today. Leukopenic yesterday now with leukocytosis. Thrombocytopenic- SQ heparin d/c. PCT > 150 although confounded by ESRD. There is concern for catheter related infection. Imaging concerning for left lower lobe pneumonia. MRI with multiple, acute, small infarcts bilaterally- suspected embolic etiology per chart- septic emboli not ruled out. ECHO completed today.  Per nephrology patient will not go to HD today and tentative plan for HD 12/21. Normal dialysis schedule is [facility].  Vancomycin Random  80mcg/mL (after 1 dose)- this level does not give a clear picture of VR after several doses. However, the level is still low.   Plan: 12/21 @ 0630 pre-HD random vanc level ordered for 12/25 w/ am labs as a pre-HD. Inpatient dialysis schedule will be MWF per nephro.  Height: 5\' 8"  (172.7 cm) Weight: 246 lb (111.6 kg) IBW/kg (Calculated) : 68.4  Temp (24hrs), Avg:99.4 F (37.4 C), Min:98.8 F (37.1 C), Max:100.2 F (37.9 C)  Recent Labs  Lab 06/10/2019 0048 06/01/2019 0235 05/15/2019 0456 06/11/2019 1941 06/03/19 1136 06/03/19 1820 05/29/2019 0343  WBC 7.6  --  6.4 1.8* 19.6*  --  33.0*  CREATININE 15.54*  --  15.46* 8.76* 10.34*  --  12.14*  LATICACIDVEN  --  1.0 1.1  --   --   --   --   VANCORANDOM  --   --   --   --   --  5  --     Estimated Creatinine Clearance: 9.2 mL/min (A) (by C-G formula based on SCr of 12.14 mg/dL (H)).    No Known  Allergies  Antimicrobials this admission: Pip/tazo 12/19 >>  Vancomycin 12/19 >>   Dose adjustments this admission: N/A  Microbiology results: 12/19 BCx: Ng x <24h 12/19 BCx: Ngx 1 day 12/19 SARS-CoV-2/Influenza A/B: negative  Thank you for allowing pharmacy to be a part of this patient's care.  Tobie Lords, PharmD, BCPS Clinical Pharmacist 05/21/2019 6:35 AM

## 2019-06-04 NOTE — Progress Notes (Signed)
[facility]  ROUNDING NOTE   Subjective:   Not breathing well on bipap. Plan on intubation today.   Requiring vasopressors last night.  Objective:  Vital signs in last 24 hours:  Temp:  [98.8 F (37.1 C)-100.2 F (37.9 C)] 98.8 F (37.1 C) (12/21 0800) Pulse Rate:  [89-104] 89 (12/21 1000) Resp:  [17-42] 17 (12/21 1000) BP: (56-192)/(24-146) 106/55 (12/21 1000) SpO2:  [87 %-100 %] 100 % (12/21 1000)  Weight change:  Filed Weights   06/03/2019 0034 06/03/19 0855  Weight: 111.6 kg 111.6 kg    Intake/Output: I/O last 3 completed shifts: In: 853.9 [I.V.:553.9; IV Piggyback:300] Out: 40 [Urine:40]   Intake/Output this shift:  No intake/output data recorded.  Physical Exam: General: Critically ill   Head: Normocephalic, atraumatic. Moist oral mucosal membranes  Eyes: Anicteric, PERRL  Neck: Supple, trachea midline  Lungs:  BIPAP, crackles  Heart: regular  Abdomen:  Soft, nontender, obese  Extremities: ++ peripheral edema.  Neurologic: lethargic  Skin: No lesions  Access: RIJ permcath    Basic Metabolic Panel: Recent Labs  Lab 06/06/2019 0048 05/27/2019 0456 05/25/2019 1941 06/03/19 1136 05/29/2019 0343  NA 141 143 139 138 137  K 6.4* 6.4* 4.0 5.5* 6.1*  CL 101 101 98 98 96*  CO2 20* 21* 21* 23 24  GLUCOSE 180* 227* 108* 82 142*  BUN 121* 123* 61* 76* 93*  CREATININE 15.54* 15.46* 8.76* 10.34* 12.14*  CALCIUM 7.3* 7.2* 7.8* 6.6* 6.2*  PHOS  --  7.9*  --   --   --     Liver Function Tests: Recent Labs  Lab 05/23/2019 0048 06/09/2019 1941  AST 27 109*  ALT 28 71*  ALKPHOS 190* 318*  BILITOT 0.8 2.3*  PROT 8.0 7.4  ALBUMIN 4.0 3.7   No results for input(s): LIPASE, AMYLASE in the last 168 hours. No results for input(s): AMMONIA in the last 168 hours.  CBC: Recent Labs  Lab 05/19/2019 0048 05/18/2019 0456 05/31/2019 1941 06/03/19 1136 05/16/2019 0343  WBC 7.6 6.4 1.8* 19.6* 33.0*  NEUTROABS 4.9  --  1.3*  --   --   HGB 9.3* 8.3* 8.6* 9.3* 8.6*   HCT 29.3* 26.0* 26.8* 28.2* 27.3*  MCV 102.4* 102.8* 101.5* 99.3 102.2*  PLT 118* 106* 66* 47* 58*    Cardiac Enzymes: No results for input(s): CKTOTAL, CKMB, CKMBINDEX, TROPONINI in the last 168 hours.  BNP: Invalid input(s): POCBNP  CBG: Recent Labs  Lab 06/03/19 1636 06/03/19 1901 06/03/19 2105 06/09/2019 0421 06/12/2019 0723  GLUCAP 92 102* 96 124* 124*    Microbiology: Results for orders placed or performed during the hospital encounter of 06/09/2019  Respiratory Panel by RT PCR (Flu A&B, Covid) - Nasopharyngeal Swab     Status: None   Collection Time: 05/28/2019  2:35 AM   Specimen: Nasopharyngeal Swab  Result Value Ref Range Status   SARS Coronavirus 2 by RT PCR NEGATIVE NEGATIVE Final    Comment: (NOTE) SARS-CoV-2 target nucleic acids are NOT DETECTED. The SARS-CoV-2 RNA is generally detectable in upper respiratoy specimens during the acute phase of infection. The lowest concentration of SARS-CoV-2 viral copies this assay can detect is 131 copies/mL. A negative result does not preclude SARS-Cov-2 infection and should not be used as the sole basis for treatment or other patient management decisions. A negative result may occur with  improper specimen collection/handling, submission of specimen other than nasopharyngeal swab, presence of viral mutation(s) within the areas targeted by this assay, and inadequate  number of viral copies (<131 copies/mL). A negative result must be combined with clinical observations, patient history, and epidemiological information. The expected result is Negative. Fact Sheet for Patients:  PinkCheek.be Fact Sheet for Healthcare Providers:  GravelBags.it This test is not yet ap proved or cleared by the [place] FDA and  has been authorized for detection and/or diagnosis of SARS-CoV-2 by FDA under an Emergency Use Authorization (EUA). This EUA will remain  in effect (meaning  this test can be used) for the duration of the COVID-19 declaration under Section 564(b)(1) of the Act, 21 U.S.C. section 360bbb-3(b)(1), unless the authorization is terminated or revoked sooner.    Influenza A by PCR NEGATIVE NEGATIVE Final   Influenza B by PCR NEGATIVE NEGATIVE Final    Comment: (NOTE) The Xpert Xpress SARS-CoV-2/FLU/RSV assay is intended as an aid in  the diagnosis of influenza from Nasopharyngeal swab specimens and  should not be used as a sole basis for treatment. Nasal washings and  aspirates are unacceptable for Xpert Xpress SARS-CoV-2/FLU/RSV  testing. Fact Sheet for Patients: PinkCheek.be Fact Sheet for Healthcare Providers: GravelBags.it This test is not yet approved or cleared by the [place] FDA and  has been authorized for detection and/or diagnosis of SARS-CoV-2 by  FDA under an Emergency Use Authorization (EUA). This EUA will remain  in effect (meaning this test can be used) for the duration of the  Covid-19 declaration under Section 564(b)(1) of the Act, 21  U.S.C. section 360bbb-3(b)(1), unless the authorization is  terminated or revoked. Performed at [facility], [address] [place], [place] [address]   Culture, blood (routine x 2)     Status: None (Preliminary result)   Collection Time: 06/01/2019  2:36 AM   Specimen: BLOOD  Result Value Ref Range Status   Specimen Description BLOOD LEFT ANTECUBITAL  Final   Special Requests   Final    BOTTLES DRAWN AEROBIC AND ANAEROBIC Blood Culture adequate volume   Culture   Final    NO GROWTH 2 DAYS Performed at [facility], [address], [place], [place] [address]    Report Status PENDING  Incomplete  Culture, blood (routine x 2)     Status: None (Preliminary result)   Collection Time: 06/12/2019  2:36 AM   Specimen: BLOOD  Result Value Ref Range Status   Specimen Description BLOOD RIGHT FOREARM  Final   Special  Requests   Final    BOTTLES DRAWN AEROBIC AND ANAEROBIC Blood Culture adequate volume   Culture   Final    NO GROWTH 2 DAYS Performed at [facility], [address] [place], [place] [address]    Report Status PENDING  Incomplete  Culture, blood (routine x 2)     Status: None (Preliminary result)   Collection Time: 06/13/2019  3:10 PM   Specimen: BLOOD  Result Value Ref Range Status   Specimen Description BLOOD A-LINE  Final   Special Requests   Final    BOTTLES DRAWN AEROBIC AND ANAEROBIC Blood Culture results may not be optimal due to an excessive volume of blood received in culture bottles   Culture   Final    NO GROWTH 2 DAYS Performed at [facility], [address] [place],  [address]    Report Status PENDING  Incomplete  Culture, blood (routine x 2)     Status: None (Preliminary result)   Collection Time: 06/09/2019  3:10 PM   Specimen: BLOOD  Result Value Ref Range  Status   Specimen Description BLOOD A-LINE  Final   Special Requests   Final    BOTTLES DRAWN AEROBIC AND ANAEROBIC Blood Culture results may not be optimal due to an excessive volume of blood received in culture bottles   Culture   Final    NO GROWTH 2 DAYS Performed at [facility], [address], [place], [place] [address]    Report Status PENDING  Incomplete  MRSA PCR Screening     Status: None   Collection Time: 06/03/19  5:45 PM   Specimen: Nasopharyngeal  Result Value Ref Range Status   MRSA by PCR NEGATIVE NEGATIVE Final    Comment:        The GeneXpert MRSA Assay (FDA approved for NASAL specimens only), is one component of a comprehensive MRSA colonization surveillance program. It is not intended to diagnose MRSA infection nor to guide or monitor treatment for MRSA infections. Performed at [facility], [address] [place], [place] [address]     Coagulation Studies: No results for input(s): LABPROT, INR in the last 72  hours.  Urinalysis: No results for input(s): COLORURINE, LABSPEC, PHURINE, GLUCOSEU, HGBUR, BILIRUBINUR, KETONESUR, PROTEINUR, UROBILINOGEN, NITRITE, LEUKOCYTESUR in the last 72 hours.  Invalid input(s): APPERANCEUR    Imaging: CT Head Wo Contrast  Result Date: 06/01/2019 CLINICAL DATA:  48 year old male with syncope. Patient with end-stage renal disease on dialysis. EXAM: CT HEAD WITHOUT CONTRAST TECHNIQUE: Contiguous axial images were obtained from the base of the skull through the vertex without intravenous contrast. COMPARISON:  11/17/2010 CT FINDINGS: Brain: No evidence of acute infarction, hemorrhage, hydrocephalus, extra-axial collection or mass lesion/mass effect. Vascular: Mild carotid atherosclerotic calcifications noted. Skull: Normal. Negative for fracture or focal lesion. Sinuses/Orbits: No acute finding. Other: None. IMPRESSION: No evidence of acute intracranial abnormality. Electronically Signed   By: Margarette Canada M.D.   On: 06/10/2019 20:20   CT ANGIO CHEST PE W OR WO CONTRAST  Result Date: 06/03/2019 CLINICAL DATA:  Shortness of breath. Missed recent dialysis sessions. EXAM: CT ANGIOGRAPHY CHEST WITH CONTRAST TECHNIQUE: Multidetector CT imaging of the chest was performed using the standard protocol during bolus administration of intravenous contrast. Multiplanar CT image reconstructions and MIPs were obtained to evaluate the vascular anatomy. CONTRAST:  155mL OMNIPAQUE IOHEXOL 350 MG/ML SOLN COMPARISON:  Chest x-ray from yesterday. FINDINGS: Cardiovascular: Suboptimal evaluation of the segmental pulmonary arteries due to contrast bolus timing and motion artifact. No evidence of central or lobar pulmonary embolism. Unchanged mild cardiomegaly. No pericardial effusion. No thoracic aortic aneurysm or dissection. Coronary, aortic arch, and branch vessel atherosclerotic vascular disease. Dense calcification of the mitral annulus. Mediastinum/Nodes: No enlarged mediastinal, hilar, or  axillary lymph nodes. Thyroid gland, trachea, and esophagus demonstrate no significant findings. Lungs/Pleura: Trace left pleural effusion. Consolidation atelectasis in the left lower lobe. Minimal subsegmental atelectasis in the right lower lobe. Chronic pleuroparenchymal scarring in the left upper lobe. No pneumothorax. Upper Abdomen: No acute abnormality. Musculoskeletal: No chest wall abnormality. No acute or significant osseous findings. Review of the MIP images confirms the above findings. IMPRESSION: 1. No central or lobar pulmonary embolism. Suboptimal opacification of the segmental pulmonary arteries due to contrast bolus timing and motion artifact. 2. Left lower lobe pneumonia.  Trace left pleural effusion. 3.  Aortic atherosclerosis (ICD10-I70.0). Electronically Signed   By: Titus Dubin M.D.   On: 06/03/2019 12:34   MR ANGIO HEAD WO CONTRAST  Result Date: 06/03/2019 CLINICAL DATA:  Initial evaluation for acute altered mental status. EXAM:  MRI HEAD WITHOUT CONTRAST MRA HEAD WITHOUT CONTRAST TECHNIQUE: Multiplanar, multiecho pulse sequences of the brain and surrounding structures were obtained without intravenous contrast. Angiographic images of the head were obtained using MRA technique without contrast. COMPARISON:  Prior CT from 06/01/2019. FINDINGS: MRI HEAD FINDINGS Brain: Examination moderately degraded by motion artifact. Cerebral volume within normal limits for age. Patchy multifocal areas of restricted diffusion seen involving the periventricular white matter of the corona radiata and centrum semi ovale bilaterally, right slightly worse than left. Largest area of infarction seen on the right and measures up to 2.1 cm. No associated hemorrhage or mass effect. No other evidence for acute or subacute ischemia. Gray-white matter differentiation otherwise maintained. No encephalomalacia to suggest chronic cortical infarction. 8 mm focus of susceptibility artifact noted at the periventricular  white matter of the right centrum semi ovale, consistent with a chronic microhemorrhage. No mass lesion, midline shift or mass effect. No hydrocephalus. No extra-axial fluid collection. No obvious intrinsic temporal lobe abnormality. Pituitary gland suprasellar region normal. Midline structures intact. Vascular: Major intracranial vascular flow voids grossly maintained at the skull base. Skull and upper cervical spine: Craniocervical junction normal. Upper cervical spine within normal limits. Bone marrow signal intensity normal. No scalp soft tissue abnormality. Sinuses/Orbits: Globes and orbital soft tissues within normal limits. Paranasal sinuses are largely clear. No significant mastoid effusion. Inner ear structures grossly normal. Other: None. MRA HEAD FINDINGS ANTERIOR CIRCULATION: Examination moderately degraded by motion artifact. Visualized distal cervical segments of the internal carotid arteries are widely patent with symmetric antegrade flow. Petrous, cavernous, and supraclinoid ICAs widely patent without appreciable flow-limiting stenosis. A1 segments patent bilaterally. Normal anterior communicating artery. Anterior cerebral arteries markedly limited assessment due to motion, but are grossly patent to their distal aspects. No M1 stenosis or occlusion. Negative MCA bifurcations. Distal MCA branches markedly limited in assessment due to motion, but are grossly perfused and symmetric. POSTERIOR CIRCULATION: Vertebral arteries patent to the vertebrobasilar junction without stenosis. Left vertebral artery dominant. Patent right PICA. Left PICA not seen. Basilar widely patent to its distal aspect without stenosis. Superior cerebral arteries patent bilaterally. Right PCA primarily supplied via the basilar. Fetal type origin left PCA. Both PCAs well perfused to their distal aspects without stenosis. No appreciable aneurysm or other vascular abnormality. IMPRESSION: MRI HEAD IMPRESSION: 1. Motion degraded exam.  2. Patchy multifocal acute ischemic nonhemorrhagic infarcts involving the periventricular white matter of the corona radiata and centrum semi ovale bilaterally. No significant mass effect. 3. Otherwise grossly normal brain MRI for age. MRA HEAD IMPRESSION: 1. Technically limited exam due to motion. 2. Grossly negative intracranial MRA. No large vessel occlusion, hemodynamically significant stenosis, or other acute vascular abnormality. Electronically Signed   By: Jeannine Boga M.D.   On: 06/03/2019 00:51   MR BRAIN WO CONTRAST  Result Date: 06/03/2019 CLINICAL DATA:  Initial evaluation for acute altered mental status. EXAM: MRI HEAD WITHOUT CONTRAST MRA HEAD WITHOUT CONTRAST TECHNIQUE: Multiplanar, multiecho pulse sequences of the brain and surrounding structures were obtained without intravenous contrast. Angiographic images of the head were obtained using MRA technique without contrast. COMPARISON:  Prior CT from 05/17/2019. FINDINGS: MRI HEAD FINDINGS Brain: Examination moderately degraded by motion artifact. Cerebral volume within normal limits for age. Patchy multifocal areas of restricted diffusion seen involving the periventricular white matter of the corona radiata and centrum semi ovale bilaterally, right slightly worse than left. Largest area of infarction seen on the right and measures up to 2.1 cm. No associated hemorrhage or mass  effect. No other evidence for acute or subacute ischemia. Gray-white matter differentiation otherwise maintained. No encephalomalacia to suggest chronic cortical infarction. 8 mm focus of susceptibility artifact noted at the periventricular white matter of the right centrum semi ovale, consistent with a chronic microhemorrhage. No mass lesion, midline shift or mass effect. No hydrocephalus. No extra-axial fluid collection. No obvious intrinsic temporal lobe abnormality. Pituitary gland suprasellar region normal. Midline structures intact. Vascular: Major  intracranial vascular flow voids grossly maintained at the skull base. Skull and upper cervical spine: Craniocervical junction normal. Upper cervical spine within normal limits. Bone marrow signal intensity normal. No scalp soft tissue abnormality. Sinuses/Orbits: Globes and orbital soft tissues within normal limits. Paranasal sinuses are largely clear. No significant mastoid effusion. Inner ear structures grossly normal. Other: None. MRA HEAD FINDINGS ANTERIOR CIRCULATION: Examination moderately degraded by motion artifact. Visualized distal cervical segments of the internal carotid arteries are widely patent with symmetric antegrade flow. Petrous, cavernous, and supraclinoid ICAs widely patent without appreciable flow-limiting stenosis. A1 segments patent bilaterally. Normal anterior communicating artery. Anterior cerebral arteries markedly limited assessment due to motion, but are grossly patent to their distal aspects. No M1 stenosis or occlusion. Negative MCA bifurcations. Distal MCA branches markedly limited in assessment due to motion, but are grossly perfused and symmetric. POSTERIOR CIRCULATION: Vertebral arteries patent to the vertebrobasilar junction without stenosis. Left vertebral artery dominant. Patent right PICA. Left PICA not seen. Basilar widely patent to its distal aspect without stenosis. Superior cerebral arteries patent bilaterally. Right PCA primarily supplied via the basilar. Fetal type origin left PCA. Both PCAs well perfused to their distal aspects without stenosis. No appreciable aneurysm or other vascular abnormality. IMPRESSION: MRI HEAD IMPRESSION: 1. Motion degraded exam. 2. Patchy multifocal acute ischemic nonhemorrhagic infarcts involving the periventricular white matter of the corona radiata and centrum semi ovale bilaterally. No significant mass effect. 3. Otherwise grossly normal brain MRI for age. MRA HEAD IMPRESSION: 1. Technically limited exam due to motion. 2. Grossly negative  intracranial MRA. No large vessel occlusion, hemodynamically significant stenosis, or other acute vascular abnormality. Electronically Signed   By: Jeannine Boga M.D.   On: 06/03/2019 00:51   US Carotid Bilateral (at [facility] and [facility] only)  Result Date: 06/03/2019 CLINICAL DATA:  48 year old male with acute altered mental status, possible stroke EXAM: BILATERAL CAROTID DUPLEX ULTRASOUND TECHNIQUE: Pearline Cables scale imaging, color Doppler and duplex ultrasound were performed of bilateral carotid and vertebral arteries in the neck. COMPARISON:  Brain MRI 06/03/2019 FINDINGS: Criteria: Quantification of carotid stenosis is based on velocity parameters that correlate the residual internal carotid diameter with NASCET-based stenosis levels, using the diameter of the distal internal carotid lumen as the denominator for stenosis measurement. The following velocity measurements were obtained: RIGHT ICA: 103/36 cm/sec CCA: [id number] cm/sec SYSTOLIC ICA/CCA RATIO:  1.1 ECA:  171 cm/sec LEFT ICA: 79/24 cm/sec CCA: [id number] cm/sec SYSTOLIC ICA/CCA RATIO:  0.6 ECA:  106 cm/sec RIGHT CAROTID ARTERY: Mild heterogeneous atherosclerotic plaque in the proximal internal carotid artery. By peak systolic velocity criteria, the estimated stenosis remains less than 50%. RIGHT VERTEBRAL ARTERY:  Patent with normal antegrade flow. LEFT CAROTID ARTERY: No significant atherosclerotic plaque or evidence of stenosis in the internal carotid artery. LEFT VERTEBRAL ARTERY:  Patent with normal antegrade flow. IMPRESSION: Mild (1-49%) stenosis proximal right internal carotid artery secondary to heterogenous atherosclerotic plaque. No significant atherosclerotic plaque or evidence of stenosis in the left internal carotid artery. The vertebral arteries are patent with normal antegrade flow. Electronically Signed  By: Jacqulynn Cadet M.D.   On: 06/03/2019 10:40   DG Chest Portable 1 View  Result Date: 06/09/2019 CLINICAL DATA:  Loss of consciousness  EXAM: PORTABLE CHEST 1 VIEW COMPARISON:  June 02, 2019 FINDINGS: Again noted is mild cardiomegaly. A right-sided central venous catheter seen with the tip at the superior cavoatrial junction. There is prominence of the pulmonary vasculature. There is mildly increased hazy airspace opacity seen at the left lung base with pleural thickening. No acute osseous abnormality. 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 IMPRESSION: Pulmonary vascular congestion and mild cardiomegaly. Unchanged chronic hazy airspace opacity at the left lung base/pleural thickening. Electronically Signed   By: Prudencio Pair M.D.   On: 05/19/2019 20:04   ECHOCARDIOGRAM COMPLETE BUBBLE STUDY  Result Date: 06/03/2019   ECHOCARDIOGRAM REPORT   Patient Name:   BAKER SHIFRIN Date of Exam: 06/03/2019 Medical Rec #:  [id number]    Height:       68.0 in Accession #:    [id number]   Weight:       246.0 lb Date of Birth:  01/07/1971    BSA:          2.23 m Patient Age:    64 years     BP:           111/37 mmHg Patient Gender: M            HR:           97 bpm. Exam Location:  [facility] Procedure: 2D Echo Indications:     STROKE 434.91/I63.9  History:         Patient has no prior history of Echocardiogram examinations.                  Risk Factors:Hypertension and Diabetes. ESRD.  Sonographer:     Avanell Shackleton Referring Phys:  [place] Diagnosing Phys: Serafina Royals MD  Sonographer Comments: Technically challenging study due to limited acoustic windows, Technically difficult study due to poor echo windows, suboptimal parasternal window and suboptimal apical window. Image acquisition challenging due to patient body habitus. PT COULD NOT TURN ON HIS LEFT SIDE. BUBBLE STUDY WAS ATTEMPTED TWICE IMPRESSIONS  1. Left ventricular ejection fraction, by visual estimation, is 60 to 65%. The left ventricle has normal function. There is no left ventricular hypertrophy.  2. The left ventricle has no regional wall motion abnormalities.  3. Global right  ventricle has normal systolic function.The right ventricular size is normal. No increase in right ventricular wall thickness.  4. Left atrial size was normal.  5. Right atrial size was normal.  6. The mitral valve is normal in structure. Mild mitral valve regurgitation.  7. The tricuspid valve is normal in structure. Tricuspid valve regurgitation is mild.  8. The aortic valve is normal in structure. Aortic valve regurgitation is mild.  9. The pulmonic valve was normal in structure. Pulmonic valve regurgitation is not visualized. FINDINGS  Left Ventricle: Left ventricular ejection fraction, by visual estimation, is 60 to 65%. The left ventricle has normal function. The left ventricle has no regional wall motion abnormalities. There is no left ventricular hypertrophy. Right Ventricle: The right ventricular size is normal. No increase in right ventricular wall thickness. Global RV systolic function is has normal systolic function. Left Atrium: Left atrial size was normal in size. Right Atrium: Right atrial size was normal in size Pericardium: There is no evidence of pericardial effusion. Mitral Valve: The mitral valve is normal in structure. Mild mitral valve  regurgitation. Tricuspid Valve: The tricuspid valve is normal in structure. Tricuspid valve regurgitation is mild. Aortic Valve: The aortic valve is normal in structure. Aortic valve regurgitation is mild. Aortic valve mean gradient measures 8.0 mmHg. Aortic valve peak gradient measures 12.7 mmHg. Aortic valve area, by VTI measures 2.41 cm. Pulmonic Valve: The pulmonic valve was normal in structure. Pulmonic valve regurgitation is not visualized. Pulmonic regurgitation is not visualized. Aorta: The aortic root, ascending aorta and aortic arch are all structurally normal, with no evidence of dilitation or obstruction. IAS/Shunts: No atrial level shunt detected by color flow Doppler. Agitated saline contrast was given intravenously to evaluate for intracardiac  shunting.  LEFT VENTRICLE PLAX 2D LVIDd:         4.74 cm LVIDs:         2.81 cm LV PW:         1.20 cm LV IVS:        1.47 cm LVOT diam:     2.00 cm LV SV:         75 ml LV SV Index:   31.60 LVOT Area:     3.14 cm  IVC IVC diam: 2.10 cm LEFT ATRIUM            Index LA diam:      5.30 cm  2.37 cm/m LA Vol (A4C): 114.0 ml 51.08 ml/m  AORTIC VALVE AV Area (Vmax):    2.72 cm AV Area (Vmean):   2.51 cm AV Area (VTI):     2.41 cm AV Vmax:           178.00 cm/s AV Vmean:          129.000 cm/s AV VTI:            0.319 m AV Peak Grad:      12.7 mmHg AV Mean Grad:      8.0 mmHg LVOT Vmax:         154.00 cm/s LVOT Vmean:        103.000 cm/s LVOT VTI:          0.245 m LVOT/AV VTI ratio: 0.77  AORTA Ao Root diam: 3.10 cm MITRAL VALVE MV Area (PHT): 7.16 cm              SHUNTS MV PHT:        30.74 msec            Systemic VTI:  0.24 m MV Decel Time: 106 msec              Systemic Diam: 2.00 cm MV E velocity: 160.00 cm/s 103 cm/s MV A velocity: 171.00 cm/s 70.3 cm/s MV E/A ratio:  0.94        1.5  Serafina Royals MD Electronically signed by Serafina Royals MD Signature Date/Time: 06/03/2019/2:08:13 PM    Final      Medications:   . sodium chloride    . dextrose 10 mL/hr at 06/03/2019 0600  . fentaNYL infusion INTRAVENOUS    . piperacillin-tazobactam (ZOSYN)  IV 3.375 g (06/02/2019 1052)  . vancomycin     .  stroke: mapping our early stages of recovery book   Does not apply Once  . aspirin  300 mg Rectal Daily   Or  . aspirin EC  325 mg Oral Daily  . atorvastatin  80 mg Oral q morning - 10a  . calcium acetate  2,001 mg Oral TID WC  . Chlorhexidine Gluconate Cloth  6 each Topical Q0600  .  fentaNYL      . fentaNYL (SUBLIMAZE) injection  400 mcg Intravenous Once  . insulin aspart  0-9 Units Subcutaneous TID WC  . midazolam  2 mg Intravenous Once  . vecuronium  10 mg Intravenous Once   acetaminophen **OR** acetaminophen, ipratropium-albuterol, midazolam, ondansetron **OR** ondansetron (ZOFRAN) IV,  traZODone  Assessment/ Plan:  Mr. Brexton Jaskolski is a 48 y.o. Hispanic male with end stage renal disease, diabetes mellitus type I, hypertension, sleep apnea who was admitted to [facility] on 06/08/2019 for 06/01/2019  [facility] MWF [facility] RIJ 120kg  1. End stage renal disease on hemodialysis with hyperkalemia: currently not hemodynamically stable for intermittent hemodialysis. Intermittently needing vasopressors.  - Place on CRRT. Orders prepared. Will use permcath.   2. Hypotension: with sepsis and pneumonia - broad spectrum antibiotics: vanco and zosyn - norepinephrine ordered.  3. Pneumonia: with acute respiratory failure requiring intubation and mechanical ventilation. Secondary to pneumonia - Appreciate critical care input.   4. Anemia of chronic kidney disease: macrocytic - holding EPO  5. Secondary Hyperparathyroidism with hypocalcemia - discontinue calcium acetate - calcium gluconate IV    LOS: 2 Forrest Demuro 12/21/202011:46 AM

## 2019-06-04 NOTE — Procedures (Signed)
Endotracheal Intubation: Patient required placement of an artificial airway secondary to Respiratory Failure  Consent: Emergent.   Hand washing performed prior to starting the procedure.   Medications administered for sedation prior to procedure:  Midazolam 4 mg IV,  Vecuronium 10 mg IV, Fentanyl 100 mcg IV.    A time out procedure was called and correct patient, name, & ID confirmed. Needed supplies and equipment were assembled and checked to include ETT, 10 ml syringe, Glidescope, Mac and Miller blades, suction, oxygen and bag mask valve, end tidal CO2 monitor.   Patient was positioned to align the mouth and pharynx to facilitate visualization of the glottis.   Heart rate, SpO2 and blood pressure was continuously monitored during the procedure. Pre-oxygenation was conducted prior to intubation and endotracheal tube was placed through the vocal cords into the trachea.     The artificial airway was placed under direct visualization via glidescope route using a 8.0 ETT on the first attempt.  ETT was secured at 23 cm mark.  Placement was confirmed by auscuitation of lungs with good breath sounds bilaterally and no stomach sounds.  Condensation was noted on endotracheal tube.   Pulse ox 98%.  CO2 detector in place with appropriate color change.   Complications: None .   Operator: Larine Fielding.   Chest radiograph ordered and pending.   Comments: OGT placed via glidescope.  Jayline Kilburg David Corry Ihnen, M.D.  [facility]  Medical Director [facility] [place] Medical Director [facility] Cardio-Pulmonary Department       

## 2019-06-05 ENCOUNTER — Encounter: Payer: Self-pay | Admitting: Cardiology

## 2019-06-05 LAB — BASIC METABOLIC PANEL
Anion gap: 11 (ref 5–15)
BUN: 40 mg/dL — ABNORMAL HIGH (ref 6–20)
CO2: 25 mmol/L (ref 22–32)
Calcium: 8.2 mg/dL — ABNORMAL LOW (ref 8.9–10.3)
Chloride: 98 mmol/L (ref 98–111)
Creatinine, Ser: 4.17 mg/dL — ABNORMAL HIGH (ref 0.61–1.24)
GFR calc Af Amer: 19 mL/min — ABNORMAL LOW (ref >60)
GFR calc non Af Amer: 16 mL/min — ABNORMAL LOW (ref >60)
Glucose, Bld: 161 mg/dL — ABNORMAL HIGH (ref 70–99)
Potassium: 3.9 mmol/L (ref 3.5–5.1)
Sodium: 134 mmol/L — ABNORMAL LOW (ref 135–145)

## 2019-06-05 LAB — LEGIONELLA PNEUMOPHILA SEROGP 1 UR AG: L. pneumophila Serogp 1 Ur Ag: NEGATIVE

## 2019-06-05 LAB — URINE CULTURE: Culture: NO GROWTH

## 2019-06-05 LAB — RENAL FUNCTION PANEL
Albumin: 2.8 g/dL — ABNORMAL LOW (ref 3.5–5.0)
Albumin: 2.7 g/dL — ABNORMAL LOW (ref 3.5–5.0)
Albumin: 2.6 g/dL — ABNORMAL LOW (ref 3.5–5.0)
Anion gap: 16 — ABNORMAL HIGH (ref 5–15)
Anion gap: 12 (ref 5–15)
Anion gap: 14 (ref 5–15)
BUN: 69 mg/dL — ABNORMAL HIGH (ref 6–20)
BUN: 55 mg/dL — ABNORMAL HIGH (ref 6–20)
BUN: 44 mg/dL — ABNORMAL HIGH (ref 6–20)
CO2: 23 mmol/L (ref 22–32)
CO2: 25 mmol/L (ref 22–32)
CO2: 21 mmol/L — ABNORMAL LOW (ref 22–32)
Calcium: 7.7 mg/dL — ABNORMAL LOW (ref 8.9–10.3)
Calcium: 8 mg/dL — ABNORMAL LOW (ref 8.9–10.3)
Calcium: 8 mg/dL — ABNORMAL LOW (ref 8.9–10.3)
Chloride: 98 mmol/L (ref 98–111)
Chloride: 100 mmol/L (ref 98–111)
Chloride: 99 mmol/L (ref 98–111)
Creatinine, Ser: 7.31 mg/dL — ABNORMAL HIGH (ref 0.61–1.24)
Creatinine, Ser: 6.3 mg/dL — ABNORMAL HIGH (ref 0.61–1.24)
Creatinine, Ser: 5.07 mg/dL — ABNORMAL HIGH (ref 0.61–1.24)
GFR calc Af Amer: 9 mL/min — ABNORMAL LOW (ref >60)
GFR calc Af Amer: 11 mL/min — ABNORMAL LOW (ref >60)
GFR calc Af Amer: 15 mL/min — ABNORMAL LOW (ref >60)
GFR calc non Af Amer: 8 mL/min — ABNORMAL LOW (ref >60)
GFR calc non Af Amer: 10 mL/min — ABNORMAL LOW (ref >60)
GFR calc non Af Amer: 13 mL/min — ABNORMAL LOW (ref >60)
Glucose, Bld: 91 mg/dL (ref 70–99)
Glucose, Bld: 79 mg/dL (ref 70–99)
Glucose, Bld: 116 mg/dL — ABNORMAL HIGH (ref 70–99)
Phosphorus: 6 mg/dL — ABNORMAL HIGH (ref 2.5–4.6)
Phosphorus: 5 mg/dL — ABNORMAL HIGH (ref 2.5–4.6)
Phosphorus: 4.6 mg/dL (ref 2.5–4.6)
Potassium: 4.5 mmol/L (ref 3.5–5.1)
Potassium: 4.1 mmol/L (ref 3.5–5.1)
Potassium: 3.8 mmol/L (ref 3.5–5.1)
Sodium: 137 mmol/L (ref 135–145)
Sodium: 137 mmol/L (ref 135–145)
Sodium: 134 mmol/L — ABNORMAL LOW (ref 135–145)

## 2019-06-05 LAB — GLUCOSE, CAPILLARY
Glucose-Capillary: 71 mg/dL (ref 70–99)
Glucose-Capillary: 89 mg/dL (ref 70–99)
Glucose-Capillary: 110 mg/dL — ABNORMAL HIGH (ref 70–99)
Glucose-Capillary: 119 mg/dL — ABNORMAL HIGH (ref 70–99)
Glucose-Capillary: 137 mg/dL — ABNORMAL HIGH (ref 70–99)

## 2019-06-05 LAB — CBC
HCT: 24.1 % — ABNORMAL LOW (ref 39.0–52.0)
Hemoglobin: 7.8 g/dL — ABNORMAL LOW (ref 13.0–17.0)
MCH: 33.3 pg (ref 26.0–34.0)
MCHC: 32.4 g/dL (ref 30.0–36.0)
MCV: 103 fL — ABNORMAL HIGH (ref 80.0–100.0)
Platelets: 83 10*3/uL — ABNORMAL LOW (ref 150–400)
RBC: 2.34 MIL/uL — ABNORMAL LOW (ref 4.22–5.81)
RDW: 14.8 % (ref 11.5–15.5)
WBC: 31.3 10*3/uL — ABNORMAL HIGH (ref 4.0–10.5)
nRBC: 0 % (ref 0.0–0.2)

## 2019-06-05 LAB — MAGNESIUM
Magnesium: 2.1 mg/dL (ref 1.7–2.4)
Magnesium: 1.9 mg/dL (ref 1.7–2.4)
Magnesium: 1.9 mg/dL (ref 1.7–2.4)
Magnesium: 2 mg/dL (ref 1.7–2.4)

## 2019-06-05 MED ORDER — DEXMEDETOMIDINE HCL IN NACL 400 MCG/100ML IV SOLN: 0.4000 ug/kg/h | INTRAVENOUS | Status: DC

## 2019-06-05 MED ORDER — AMIODARONE LOAD VIA INFUSION
150.0000 mg | Freq: Once | INTRAVENOUS | Status: AC
  Administered 2019-06-05: 150 mg via INTRAVENOUS
  Filled 2019-06-05: qty 83.34

## 2019-06-05 MED ORDER — VITAL HIGH PROTEIN PO LIQD
1000.0000 mL | ORAL | Status: DC
  Administered 2019-06-05 – 2019-06-10 (×5): 1000 mL
  Administered 2019-06-10: 40 mL/h
  Administered 2019-06-11 – 2019-06-23 (×12): 1000 mL

## 2019-06-05 MED ORDER — PRO-STAT SUGAR FREE PO LIQD
60.0000 mL | Freq: Three times a day (TID) | ORAL | Status: DC
  Administered 2019-06-05 – 2019-06-25 (×59): 60 mL

## 2019-06-05 MED ORDER — AMIODARONE HCL IN DEXTROSE 360-4.14 MG/200ML-% IV SOLN
60.0000 mg/h | INTRAVENOUS | Status: AC
  Administered 2019-06-05 (×2): 60 mg/h via INTRAVENOUS
  Filled 2019-06-05: qty 200

## 2019-06-05 MED ORDER — B COMPLEX-C PO TABS
1.0000 | ORAL_TABLET | Freq: Every day | ORAL | Status: DC
  Administered 2019-06-05 – 2019-06-25 (×21): 1
  Filled 2019-06-05 (×22): qty 1

## 2019-06-05 MED ORDER — AMIODARONE HCL IN DEXTROSE 360-4.14 MG/200ML-% IV SOLN
30.0000 mg/h | INTRAVENOUS | Status: DC
  Administered 2019-06-05 – 2019-06-10 (×10): 30 mg/h via INTRAVENOUS
  Filled 2019-06-05 (×11): qty 200

## 2019-06-05 MED ORDER — PANTOPRAZOLE SODIUM 40 MG PO PACK
40.0000 mg | PACK | Freq: Every day | ORAL | Status: DC
  Administered 2019-06-05 – 2019-06-25 (×21): 40 mg
  Filled 2019-06-05 (×21): qty 20

## 2019-06-05 NOTE — Progress Notes (Signed)
Initial Nutrition Assessment  DOCUMENTATION CODES:   Morbid obesity  INTERVENTION:  Initiate Vital High Protein at 40 mL/hr (960 mL goal daily volume) + Pro-Stat 60 mL TID per tube. Provides 1560 kcal, 174 grams of protein, 806 mL H2O daily.  Provide B-complex with C daily per tube.  Provide minimum free water flush of 20-30 mL Q4hrs to maintain tube patency.  NUTRITION DIAGNOSIS:   Inadequate oral intake related to inability to eat as evidenced by NPO status.  GOAL:   Provide needs based on ASPEN/SCCM guidelines  MONITOR:   Vent status, Labs, Weight trends, TF tolerance, I & O's  REASON FOR ASSESSMENT:   Ventilator    ASSESSMENT:   48 year old male with PMHx of HTN, DM, sleep apnea, ESRD on HD, previous COVID-19 infection admitted with acute CVA, acute respiratory failure requiring intubation on 12/21.   Patient intubated and sedated. On PRVC mode with FiO2 35% and PEEP 5 cmH2O. Abdomen soft. Last BM unknown. Plan discussed on rounds is to start tube feeds today. Dry weight listed in chart is 120 kg. Patient is on CRRT with goal UF of 25 mL/hr.  Enteral Access: 14 Fr. OGT placed 12/21; originally at 44 cm and was at the GE junction per abdominal x-ray on 12/21; after x-ray resulted tube was advanced to 60 cm and is okay for use per MD  MAP: 64-73 mmHg  Patient is currently intubated on ventilator support Ve: 8.4 L/min Temp (24hrs), Avg:98.9 F (37.2 C), Min:98.2 F (36.8 C), Max:99.7 F (37.6 C)  Propofol: N/A  Medications reviewed and include: Novolog 0-9 units TID, Protonix, fentanyl gtt, norepinephrine gtt was off at time of RD assessment, Zosyn, vancomycin.  Labs reviewed: BUN 55, Creatinine 6.3, Phosphorus 5. Triglycerides 715 on 12/21.  I/O: pt is anuric  NUTRITION - FOCUSED PHYSICAL EXAM:    Most Recent Value  Orbital Region  No depletion  Upper Arm Region  No depletion  Thoracic and Lumbar Region  No depletion  Buccal Region  Unable to assess   Temple Region  No depletion  Clavicle Bone Region  No depletion  Clavicle and Acromion Bone Region  No depletion  Scapular Bone Region  Unable to assess  Dorsal Hand  No depletion  Patellar Region  No depletion  Anterior Thigh Region  No depletion  Posterior Calf Region  No depletion  Edema (RD Assessment)  Mild  Hair  Reviewed  Eyes  Unable to assess  Mouth  Unable to assess  Skin  Reviewed  Nails  Reviewed     Diet Order:   Diet Order            Diet NPO time specified  Diet effective now             EDUCATION NEEDS:   No education needs have been identified at this time  Skin:  Skin Assessment: Reviewed RN Assessment  Last BM:  Unknown  Height:   Ht Readings from Last 1 Encounters:  06/11/2019 5' 7.99" (1.727 m)   Weight:   Wt Readings from Last 1 Encounters:  06/05/19 121.9 kg   Ideal Body Weight:  70 kg  BMI:  Body mass index is 40.87 kg/m.  Estimated Nutritional Needs:   Kcal:  1320-1680 (11-14 kcal/kg)  Protein:  175 grams (2.5 grams/kg IBW)  Fluid:  UOP + 1 L  Jacklynn Barnacle, MS, RD, LDN Office: [phone] Pager: [phone] After Hours/Weekend Pager: [phone]

## 2019-06-05 NOTE — Progress Notes (Signed)
CRITICAL CARE NOTE  48 yo Hispanic male with acute and severe resp failure from acute pneumonia with previous COVID-19 infection with acute CVA based on MRI   CC  follow up respiratory failure  SUBJECTIVE Patient remains critically ill Prognosis is guarded On CRRT Increased WOB and labile HR and BP   BP (!) 120/52   Pulse (!) 49   Temp 99.1 F (37.3 C) (Axillary)   Resp 17   Ht 5' 7.99" (1.727 m)   Wt 121.9 kg   SpO2 99%   BMI 40.87 kg/m    I/O last 3 completed shifts: In: 1571.7 [I.V.:831.1; IV Piggyback:740.5] Out: 1279 [Urine:30; Other:1249] Total I/O In: -  Out: 138 [Other:138]  SpO2: 99 % O2 Flow Rate (L/min): 3 L/min FiO2 (%): 35 %   SIGNIFICANT EVENTS 12/21 Intubated, resp failure, acute CVA Perm cath removed, vasc cath LEFT groin, Triple lumen RT groin placed 12/22 on CRRT, septic shock, severe hypoxia, on vent, unable to wean due to hemodynamic instability  REVIEW OF SYSTEMS  PATIENT IS UNABLE TO PROVIDE COMPLETE REVIEW OF SYSTEMS DUE TO SEVERE CRITICAL ILLNESS   PHYSICAL EXAMINATION:  GENERAL:critically ill appearing, +resp distress HEAD: Normocephalic, atraumatic.  EYES: Pupils equal, round, reactive to light.  No scleral icterus.  MOUTH: Moist mucosal membrane. NECK: Supple.  PULMONARY: +rhonchi, +wheezing CARDIOVASCULAR: S1 and S2. Regular rate and rhythm. No murmurs, rubs, or gallops.  GASTROINTESTINAL: Soft, nontender, -distended. No masses. Positive bowel sounds. No hepatosplenomegaly.  MUSCULOSKELETAL: No swelling, clubbing, or edema.  NEUROLOGIC: obtunded, GCS<8 SKIN:intact,warm,dry  MEDICATIONS: I have reviewed all medications and confirmed regimen as documented   CULTURE RESULTS   Recent Results (from the past 240 hour(s))  Respiratory Panel by RT PCR (Flu A&B, Covid) - Nasopharyngeal Swab     Status: None   Collection Time: 06/11/2019  2:35 AM   Specimen: Nasopharyngeal Swab  Result Value Ref Range Status   SARS Coronavirus 2  by RT PCR NEGATIVE NEGATIVE Final    Comment: (NOTE) SARS-CoV-2 target nucleic acids are NOT DETECTED. The SARS-CoV-2 RNA is generally detectable in upper respiratoy specimens during the acute phase of infection. The lowest concentration of SARS-CoV-2 viral copies this assay can detect is 131 copies/mL. A negative result does not preclude SARS-Cov-2 infection and should not be used as the sole basis for treatment or other patient management decisions. A negative result may occur with  improper specimen collection/handling, submission of specimen other than nasopharyngeal swab, presence of viral mutation(s) within the areas targeted by this assay, and inadequate number of viral copies (<131 copies/mL). A negative result must be combined with clinical observations, patient history, and epidemiological information. The expected result is Negative. Fact Sheet for Patients:  PinkCheek.be Fact Sheet for Healthcare Providers:  GravelBags.it This test is not yet ap proved or cleared by the [place] FDA and  has been authorized for detection and/or diagnosis of SARS-CoV-2 by FDA under an Emergency Use Authorization (EUA). This EUA will remain  in effect (meaning this test can be used) for the duration of the COVID-19 declaration under Section 564(b)(1) of the Act, 21 U.S.C. section 360bbb-3(b)(1), unless the authorization is terminated or revoked sooner.    Influenza A by PCR NEGATIVE NEGATIVE Final   Influenza B by PCR NEGATIVE NEGATIVE Final    Comment: (NOTE) The Xpert Xpress SARS-CoV-2/FLU/RSV assay is intended as an aid in  the diagnosis of influenza from Nasopharyngeal swab specimens and  should not be used as a sole basis for treatment.  Nasal washings and  aspirates are unacceptable for Xpert Xpress SARS-CoV-2/FLU/RSV  testing. Fact Sheet for Patients: PinkCheek.be Fact Sheet for Healthcare  Providers: GravelBags.it This test is not yet approved or cleared by the [place] FDA and  has been authorized for detection and/or diagnosis of SARS-CoV-2 by  FDA under an Emergency Use Authorization (EUA). This EUA will remain  in effect (meaning this test can be used) for the duration of the  Covid-19 declaration under Section 564(b)(1) of the Act, 21  U.S.C. section 360bbb-3(b)(1), unless the authorization is  terminated or revoked. Performed at [facility], [place] [place], [place] [address]   Culture, blood (routine x 2)     Status: None (Preliminary result)   Collection Time: 06/07/2019  2:36 AM   Specimen: BLOOD  Result Value Ref Range Status   Specimen Description BLOOD LEFT ANTECUBITAL  Final   Special Requests   Final    BOTTLES DRAWN AEROBIC AND ANAEROBIC Blood Culture adequate volume   Culture   Final    NO GROWTH 3 DAYS Performed at [facility], [address], [place], [place] [address]    Report Status PENDING  Incomplete  Culture, blood (routine x 2)     Status: None (Preliminary result)   Collection Time: 05/19/2019  2:36 AM   Specimen: BLOOD  Result Value Ref Range Status   Specimen Description BLOOD RIGHT FOREARM  Final   Special Requests   Final    BOTTLES DRAWN AEROBIC AND ANAEROBIC Blood Culture adequate volume   Culture   Final    NO GROWTH 3 DAYS Performed at [facility], [address], [place], [place] [address]    Report Status PENDING  Incomplete  Culture, blood (routine x 2)     Status: None (Preliminary result)   Collection Time: 06/08/2019  3:10 PM   Specimen: BLOOD  Result Value Ref Range Status   Specimen Description BLOOD A-LINE  Final   Special Requests   Final    BOTTLES DRAWN AEROBIC AND ANAEROBIC Blood Culture results may not be optimal due to an excessive volume of blood received in culture bottles   Culture   Final    NO GROWTH 3 DAYS Performed at [facility], [address] [place], [place] [address]    Report Status PENDING  Incomplete  Culture, blood (routine x 2)     Status: None (Preliminary result)   Collection Time: 05/22/2019  3:10 PM   Specimen: BLOOD  Result Value Ref Range Status   Specimen Description BLOOD A-LINE  Final   Special Requests   Final    BOTTLES DRAWN AEROBIC AND ANAEROBIC Blood Culture results may not be optimal due to an excessive volume of blood received in culture bottles   Culture   Final    NO GROWTH 3 DAYS Performed at [facility], [address], [place], [place] [address]    Report Status PENDING  Incomplete  MRSA PCR Screening     Status: None   Collection Time: 06/03/19  5:45 PM   Specimen: Nasopharyngeal  Result Value Ref Range Status   MRSA by PCR NEGATIVE NEGATIVE Final    Comment:        The GeneXpert MRSA Assay (FDA approved for NASAL specimens only), is one component of a comprehensive MRSA colonization surveillance program. It is not intended to diagnose MRSA infection nor to guide or monitor treatment for MRSA infections. Performed at [facility], [phone]  [address], [place], [place] [address]   Urine Culture     Status: None   Collection Time: 06/03/19  6:20 PM   Specimen: Urine, Catheterized  Result Value Ref Range Status   Specimen Description   Final    URINE, CATHETERIZED Performed at [facility], [address], [place], [place] [address]    Special Requests   Final    Immunocompromised Performed at [facility], [address], [place], [place] [address]    Culture   Final    NO GROWTH Performed at [facility], [place] [address], [place], [place] [address]    Report Status 06/05/2019 FINAL  Final  Cath Tip Culture     Status: None (Preliminary result)   Collection Time: 06/12/2019  4:32 PM   Specimen: Catheter Tip; Other  Result Value Ref Range Status   Specimen Description   Final    CATH TIP Performed at [facility], [address] [place], [place] [address]    Special Requests   Final    NONE Performed at [facility], [address], [place], [place] [address]    Culture   Final    NO GROWTH < 12 HOURS Performed at [facility], [place] [address], [place],  [address]    Report Status PENDING  Incomplete          IMAGING    DG Abd 1 View  Result Date: 06/10/2019 CLINICAL DATA:  OG tube placement EXAM: ABDOMEN - 1 VIEW COMPARISON:  None. FINDINGS: OG tube tip is at the GE junction with the side port in the distal esophagus. Nonobstructive bowel gas pattern. IMPRESSION: OG tube tip at the GE junction. This could be advanced several cm into the stomach. Electronically Signed   By: Rolm Baptise M.D.   On: 05/17/2019 12:19   PERIPHERAL VASCULAR CATHETERIZATION  Result Date: 05/27/2019 See op note  DG CHEST PORT 1 VIEW  Result Date: 05/22/2019 CLINICAL DATA:  OG tube placement.  Intubation. EXAM: PORTABLE CHEST 1 VIEW COMPARISON:  05/23/2019. FINDINGS: Endotracheal tube noted with its tip 4 cm above the carina. NG tube noted with tip below left hemidiaphragm. Dual-lumen right IJ catheter with tip over SVC. Mild infiltrates noted over the left lung and over the right lung base. No significant interim change. Small left pleural effusion again noted. No pneumothorax. IMPRESSION: 1. Endotracheal tube noted with tip 4 cm above the carina. NG tube noted with tip below left hemidiaphragm. Dual-lumen right IJ catheter with tip over SVC. 2. Mild infiltrates noted over the left lung and over the right lung base. No significant interim change. Small left pleural effusion again noted. Electronically Signed   By: Marcello Moores  Register   On: 06/05/2019 12:20       Indwelling Urinary Catheter continued, requirement due to   Reason to continue Indwelling Urinary Catheter strict Intake/Output monitoring for hemodynamic instability   Central Line/ continued, requirement due to   Reason to continue [place] of central venous pressure or other hemodynamic parameters and poor IV access   Ventilator continued, requirement due to severe respiratory failure   Ventilator Sedation RASS 0 to -2      ASSESSMENT AND PLAN SYNOPSIS   Severe ACUTE Hypoxic and Hypercapnic Respiratory Failure from pneumonia and acute aspirtaion pneumonia with inability to protect airway due to CVA, septic shock -continue Full MV support -continue Bronchodilator Therapy -Wean Fio2 and PEEP as tolerated  KIDNEY INJURY/Renal  Failure -follow chem 7 -follow UO -continue Foley Catheter-assess need -Avoid nephrotoxic agents -Recheck creatinine  On CRRT    NEUROLOGY - intubated and sedated - minimal sedation to achieve a RASS goal: -1 Acute CVA Follow up Neuro recs   SHOCK-SEPSIS/HYPOVOLUMIC -use vasopressors to keep MAP>65 as needed -follow ABG and LA -follow up cultures -emperic ABX   CARDIAC ICU monitoring  ID -continue IV abx as prescibed -follow up cultures  GI GI PROPHYLAXIS as indicated  NUTRITIONAL STATUS DIET-->TF's as tolerated Constipation protocol as indicated  ENDO - will use ICU hypoglycemic\Hyperglycemia protocol if indicated   ELECTROLYTES -follow labs as needed -replace as needed -pharmacy consultation and following   DVT/GI PRX ordered TRANSFUSIONS AS NEEDED MONITOR FSBS ASSESS the need for LABS as needed   Critical Care Time devoted to patient care services described in this note is 43 minutes.   Overall, patient is critically ill, prognosis is guarded.  Patient with Multiorgan failure and at high risk for cardiac arrest and death.    Corrin Parker, M.D.  [name]  Medical Director [place] Director [facility] Cardio-Pulmonary Department

## 2019-06-05 NOTE — Progress Notes (Signed)
Pharmacy has reviewed patient medications for CRRT. No adjustments necessary today.  Gerald Dexter, PharmD Pharmacy Resident  06/05/2019 12:00 PM

## 2019-06-05 NOTE — Progress Notes (Signed)
Hemodialysis patient known at [facility] MWF 3:45, patient self transports. Please contact me with any dialysis placement concerns.  Elvera Bicker Dialysis Coordinator [phone]

## 2019-06-05 NOTE — Progress Notes (Signed)
Assisted tele visit to patient with family member.  Thomas, Rigdon Macomber Renee, RN   

## 2019-06-05 NOTE — Progress Notes (Signed)
Notified Dr. Mortimer Fries that patient's heart rate has changed from NSR with PACS  to heart rate sustaining in  160's- EKG performed showing afib- Dr. Mortimer Fries ordered to start amio drip with bolus.

## 2019-06-05 NOTE — Consult Note (Signed)
Pharmacy Antibiotic Note  Jake Gomez is a 48 y.o. male with a medical history of type 2 diabetes mellitus, hypertension, obesity, and ESRD (normally HD MWF, but now CRRT) admitted on 06/08/2019 with sepsis. Patient with chills during HD session last Monday and the session was stopped mid-way. After he traveled to [place] and missed HD on [facility]. Presented to [facility] ED 12/19 with generalized malaise. He was hyperkalemic and went for HD during which he started experiencing chills. Pharmacy has been consulted for vancomycin and piperacillin/tazobactam dosing.   Antibiotic day 4.  Patient afebrile currently. Leukopenic on admission now with leukocytosis. Thrombocytopenic- SQ heparin d/c. PCT > 150 although confounded by ESRD. There is concern for catheter related infection. Imaging concerning for left lower lobe pneumonia. MRI with multiple, acute, small infarcts bilaterally- suspected embolic etiology per chart- septic emboli not ruled out.  Patient was intubated 12/21.  Per nephrology patient converted to CRRT from HD due to hemodynamic instability.  PermCath is being removed as this may be an infectious source, and patient placed on temporary vascular access for CRRT.  Plan: Continue piperacillin-tazobactam 3.375 g q6 hours (CRRT dose) Continue vanc 1 g q 24 hours for maintenance.  Height: 5' 7.99" (172.7 cm) Weight: 268 lb 11.9 oz (121.9 kg) IBW/kg (Calculated) : 68.38  Temp (24hrs), Avg:98.9 F (37.2 C), Min:98.2 F (36.8 C), Max:99.7 F (37.6 C)  Recent Labs  Lab 05/31/2019 0235 05/20/2019 0456 05/17/2019 1941 06/03/19 1136 06/03/19 1820 06/03/2019 0343 05/15/2019 1744 06/05/19 0449 06/05/19 0455 06/05/19 1018  WBC  --  6.4 1.8* 19.6*  --  33.0*  --  31.3*  --   --   CREATININE  --  15.46* 8.76* 10.34*  --  12.14* 11.70*  --  7.31* 6.30*  LATICACIDVEN 1.0 1.1  --   --   --   --   --   --   --   --   VANCORANDOM  --   --   --   --  5  --   --   --   --   --     Estimated Creatinine  Clearance: 18.6 mL/min (A) (by C-G formula based on SCr of 6.3 mg/dL (H)).    No Known Allergies  Antimicrobials this admission: Pip/tazo 12/19 >>  Vancomycin 12/19 >>   Dose adjustments this admission: N/A  Microbiology results: 12/21 Cath tip NG < 12 hours 12/20 UCx NG 12/20 MRSA PCR neg 12/19 BCx: Ng x 3 days 12/19 SARS-CoV-2/Influenza A/B: negative MTB RIF NAA sputum sent  Thank you for allowing pharmacy to be a part of this patient's care.  Gerald Dexter, PharmD Pharmacy Resident  06/05/2019 12:09 PM

## 2019-06-05 NOTE — Progress Notes (Signed)
[facility]  ROUNDING NOTE   Subjective:   Permcath removed and culture sent.  Temp HD catheter placed.   Patient placed on CRRT. UF of 1.3 liters.   Weaning norepinephrine.   Objective:  Vital signs in last 24 hours:  Temp:  [98.2 F (36.8 C)-99.7 F (37.6 C)] 99.1 F (37.3 C) (12/22 0748) Pulse Rate:  [47-100] 49 (12/22 1000) Resp:  [12-30] 17 (12/22 1000) BP: (75-171)/(39-93) 120/52 (12/22 1000) SpO2:  [92 %-100 %] 99 % (12/22 1000) FiO2 (%):  [30 %-35 %] 35 % (12/22 0826) Weight:  [121.9 kg] 121.9 kg (12/22 0448)  Weight change: 10.3 kg Filed Weights   05/18/2019 0034 06/03/19 0855 06/05/19 0448  Weight: 111.6 kg 111.6 kg 121.9 kg    Intake/Output: I/O last 3 completed shifts: In: 1571.7 [I.V.:831.1; IV Piggyback:740.5] Out: 1279 [Urine:30; OEUMP:5361]   Intake/Output this shift:  Total I/O In: 91.7 [I.V.:91.7] Out: 138 [Other:138]  Physical Exam: General: Critically ill   Head: Normocephalic, atraumatic. Moist oral mucosal membranes  Eyes: Anicteric, PERRL  Neck: Supple, trachea midline  Lungs:  BIPAP, crackles  Heart: regular  Abdomen:  Soft, nontender, obese  Extremities: ++ peripheral edema.  Neurologic: lethargic  Skin: No lesions  Access: Left femoral temp HD catheter 12/21 Dr. Lucky Cowboy    Basic Metabolic Panel: Recent Labs  Lab 06/11/2019 0456 06/03/19 1136 05/20/2019 0343 05/29/2019 1744 06/05/19 0455 06/05/19 1018  NA 143 138 137 136 137 137  K 6.4* 5.5* 6.1* 5.5* 4.5 4.1  CL 101 98 96* 95* 98 100  CO2 21* 23 24 21* 23 25  GLUCOSE 227* 82 142* 113* 91 79  BUN 123* 76* 93* 100* 69* 55*  CREATININE 15.46* 10.34* 12.14* 11.70* 7.31* 6.30*  CALCIUM 7.2* 6.6* 6.2* 6.5* 7.7* 8.0*  MG  --   --   --   --  2.1  --   PHOS 7.9*  --   --  8.1* 6.0* 5.0*    Liver Function Tests: Recent Labs  Lab 06/09/2019 0048 05/23/2019 1941 06/06/2019 1744 06/05/19 0455 06/05/19 1018  AST 27 109*  --   --   --   ALT 28 71*  --   --   --   ALKPHOS  190* 318*  --   --   --   BILITOT 0.8 2.3*  --   --   --   PROT 8.0 7.4  --   --   --   ALBUMIN 4.0 3.7 2.9* 2.8* 2.7*   No results for input(s): LIPASE, AMYLASE in the last 168 hours. No results for input(s): AMMONIA in the last 168 hours.  CBC: Recent Labs  Lab 06/01/2019 0048 06/12/2019 0456 06/13/2019 1941 06/03/19 1136 05/21/2019 0343 06/05/19 0449  WBC 7.6 6.4 1.8* 19.6* 33.0* 31.3*  NEUTROABS 4.9  --  1.3*  --   --   --   HGB 9.3* 8.3* 8.6* 9.3* 8.6* 7.8*  HCT 29.3* 26.0* 26.8* 28.2* 27.3* 24.1*  MCV 102.4* 102.8* 101.5* 99.3 102.2* 103.0*  PLT 118* 106* 66* 47* 58* 83*    Cardiac Enzymes: No results for input(s): CKTOTAL, CKMB, CKMBINDEX, TROPONINI in the last 168 hours.  BNP: Invalid input(s): POCBNP  CBG: Recent Labs  Lab 05/29/2019 0723 05/28/2019 1203 05/24/2019 1637 05/15/2019 2105 06/05/19 0720  GLUCAP 124* 116* 113* 111* 37    Microbiology: Results for orders placed or performed during the hospital encounter of 05/17/2019  Respiratory Panel by RT PCR (Flu A&B, Covid) - Nasopharyngeal  Swab     Status: None   Collection Time: 05/25/2019  2:35 AM   Specimen: Nasopharyngeal Swab  Result Value Ref Range Status   SARS Coronavirus 2 by RT PCR NEGATIVE NEGATIVE Final    Comment: (NOTE) SARS-CoV-2 target nucleic acids are NOT DETECTED. The SARS-CoV-2 RNA is generally detectable in upper respiratoy specimens during the acute phase of infection. The lowest concentration of SARS-CoV-2 viral copies this assay can detect is 131 copies/mL. A negative result does not preclude SARS-Cov-2 infection and should not be used as the sole basis for treatment or other patient management decisions. A negative result may occur with  improper specimen collection/handling, submission of specimen other than nasopharyngeal swab, presence of viral mutation(s) within the areas targeted by this assay, and inadequate number of viral copies (<131 copies/mL). A negative result must be combined with  clinical observations, patient history, and epidemiological information. The expected result is Negative. Fact Sheet for Patients:  PinkCheek.be Fact Sheet for Healthcare Providers:  GravelBags.it This test is not yet ap proved or cleared by the [place] FDA and  has been authorized for detection and/or diagnosis of SARS-CoV-2 by FDA under an Emergency Use Authorization (EUA). This EUA will remain  in effect (meaning this test can be used) for the duration of the COVID-19 declaration under Section 564(b)(1) of the Act, 21 U.S.C. section 360bbb-3(b)(1), unless the authorization is terminated or revoked sooner.    Influenza A by PCR NEGATIVE NEGATIVE Final   Influenza B by PCR NEGATIVE NEGATIVE Final    Comment: (NOTE) The Xpert Xpress SARS-CoV-2/FLU/RSV assay is intended as an aid in  the diagnosis of influenza from Nasopharyngeal swab specimens and  should not be used as a sole basis for treatment. Nasal washings and  aspirates are unacceptable for Xpert Xpress SARS-CoV-2/FLU/RSV  testing. Fact Sheet for Patients: PinkCheek.be Fact Sheet for Healthcare Providers: GravelBags.it This test is not yet approved or cleared by the [place] FDA and  has been authorized for detection and/or diagnosis of SARS-CoV-2 by  FDA under an Emergency Use Authorization (EUA). This EUA will remain  in effect (meaning this test can be used) for the duration of the  Covid-19 declaration under Section 564(b)(1) of the Act, 21  U.S.C. section 360bbb-3(b)(1), unless the authorization is  terminated or revoked. Performed at [facility], [address] [place], [place] [address]   Culture, blood (routine x 2)     Status: None (Preliminary result)   Collection Time: 06/01/2019  2:36 AM   Specimen: BLOOD  Result Value Ref Range Status   Specimen Description BLOOD LEFT  ANTECUBITAL  Final   Special Requests   Final    BOTTLES DRAWN AEROBIC AND ANAEROBIC Blood Culture adequate volume   Culture   Final    NO GROWTH 3 DAYS Performed at [facility], [address], [place], [place] [address]    Report Status PENDING  Incomplete  Culture, blood (routine x 2)     Status: None (Preliminary result)   Collection Time: 06/10/2019  2:36 AM   Specimen: BLOOD  Result Value Ref Range Status   Specimen Description BLOOD RIGHT FOREARM  Final   Special Requests   Final    BOTTLES DRAWN AEROBIC AND ANAEROBIC Blood Culture adequate volume   Culture   Final    NO GROWTH 3 DAYS Performed at [facility], [address], [place], [place] [address]    Report Status PENDING  Incomplete  Culture, blood (routine  x 2)     Status: None (Preliminary result)   Collection Time: 06/03/2019  3:10 PM   Specimen: BLOOD  Result Value Ref Range Status   Specimen Description BLOOD A-LINE  Final   Special Requests   Final    BOTTLES DRAWN AEROBIC AND ANAEROBIC Blood Culture results may not be optimal due to an excessive volume of blood received in culture bottles   Culture   Final    NO GROWTH 3 DAYS Performed at [facility], [address], [place], [place] [address]    Report Status PENDING  Incomplete  Culture, blood (routine x 2)     Status: None (Preliminary result)   Collection Time: 06/07/2019  3:10 PM   Specimen: BLOOD  Result Value Ref Range Status   Specimen Description BLOOD A-LINE  Final   Special Requests   Final    BOTTLES DRAWN AEROBIC AND ANAEROBIC Blood Culture results may not be optimal due to an excessive volume of blood received in culture bottles   Culture   Final    NO GROWTH 3 DAYS Performed at [facility], [address] [place], [place] [address]    Report Status PENDING  Incomplete  MRSA PCR Screening     Status: None   Collection Time: 06/03/19  5:45 PM   Specimen: Nasopharyngeal  Result Value Ref Range Status    MRSA by PCR NEGATIVE NEGATIVE Final    Comment:        The GeneXpert MRSA Assay (FDA approved for NASAL specimens only), is one component of a comprehensive MRSA colonization surveillance program. It is not intended to diagnose MRSA infection nor to guide or monitor treatment for MRSA infections. Performed at [facility], [address], [place], [place] [address]   Urine Culture     Status: None   Collection Time: 06/03/19  6:20 PM   Specimen: Urine, Catheterized  Result Value Ref Range Status   Specimen Description   Final    URINE, CATHETERIZED Performed at [facility], [address], [place], [place] [address]    Special Requests   Final    Immunocompromised Performed at [facility], [address], [place], [place] [address]    Culture   Final    NO GROWTH Performed at [facility], [address], [place], [place] [address]    Report Status 06/05/2019 FINAL  Final  Cath Tip Culture     Status: None (Preliminary result)   Collection Time: 05/18/2019  4:32 PM   Specimen: Catheter Tip; Other  Result Value Ref Range Status   Specimen Description   Final    CATH TIP Performed at [facility], [address] [place], [place] [address]    Special Requests   Final    NONE Performed at [facility], [address], [place], [place] [address]    Culture   Final    NO GROWTH < 12 HOURS Performed at [facility], [place] [address] [place],  [address]    Report Status PENDING  Incomplete    Coagulation Studies: No results for input(s): LABPROT, INR in the last 72 hours.  Urinalysis: No results for input(s): COLORURINE, LABSPEC, PHURINE, GLUCOSEU, HGBUR, BILIRUBINUR, KETONESUR, PROTEINUR, UROBILINOGEN, NITRITE, LEUKOCYTESUR in the last 72 hours.  Invalid input(s): APPERANCEUR    Imaging: DG Abd 1 View  Result Date: 06/10/2019 CLINICAL DATA:  OG tube placement EXAM: ABDOMEN - 1 VIEW  COMPARISON:  None. FINDINGS: OG tube tip is at the GE junction with the side port in the distal esophagus. Nonobstructive bowel gas pattern. IMPRESSION: OG tube tip at the GE junction. This could be advanced several cm into the stomach. Electronically Signed   By: Rolm Baptise M.D.   On: 05/28/2019 12:19   CT ANGIO CHEST PE W OR WO CONTRAST  Result Date: 06/03/2019 CLINICAL DATA:  Shortness of breath. Missed recent dialysis sessions. EXAM: CT ANGIOGRAPHY CHEST WITH CONTRAST TECHNIQUE: Multidetector CT imaging of the chest was performed using the standard protocol during bolus administration of intravenous contrast. Multiplanar CT image reconstructions and MIPs were obtained to evaluate the vascular anatomy. CONTRAST:  174m OMNIPAQUE IOHEXOL 350 MG/ML SOLN COMPARISON:  Chest x-ray from yesterday. FINDINGS: Cardiovascular: Suboptimal evaluation of the segmental pulmonary arteries due to contrast bolus timing and motion artifact. No evidence of central or lobar pulmonary embolism. Unchanged mild cardiomegaly. No pericardial effusion. No thoracic aortic aneurysm or dissection. Coronary, aortic arch, and branch vessel atherosclerotic vascular disease. Dense calcification of the mitral annulus. Mediastinum/Nodes: No enlarged mediastinal, hilar, or axillary lymph nodes. Thyroid gland, trachea, and esophagus demonstrate no significant findings. Lungs/Pleura: Trace left pleural effusion. Consolidation atelectasis in the left lower lobe. Minimal subsegmental atelectasis in the right lower lobe. Chronic pleuroparenchymal scarring in the left upper lobe. No pneumothorax. Upper Abdomen: No acute abnormality. Musculoskeletal: No chest wall abnormality. No acute or significant osseous findings. Review of the MIP images confirms the above findings. IMPRESSION: 1. No central or lobar pulmonary embolism. Suboptimal opacification of the segmental pulmonary arteries due to contrast bolus timing and motion artifact. 2. Left lower  lobe pneumonia.  Trace left pleural effusion. 3.  Aortic atherosclerosis (ICD10-I70.0). Electronically Signed   By: WTitus DubinM.D.   On: 06/03/2019 12:34   PERIPHERAL VASCULAR CATHETERIZATION  Result Date: 06/09/2019 See op note  DG CHEST PORT 1 VIEW  Result Date: 05/19/2019 CLINICAL DATA:  OG tube placement.  Intubation. EXAM: PORTABLE CHEST 1 VIEW COMPARISON:  06/12/2019. FINDINGS: Endotracheal tube noted with its tip 4 cm above the carina. NG tube noted with tip below left hemidiaphragm. Dual-lumen right IJ catheter with tip over SVC. Mild infiltrates noted over the left lung and over the right lung base. No significant interim change. Small left pleural effusion again noted. No pneumothorax. IMPRESSION: 1. Endotracheal tube noted with tip 4 cm above the carina. NG tube noted with tip below left hemidiaphragm. Dual-lumen right IJ catheter with tip over SVC. 2. Mild infiltrates noted over the left lung and over the right lung base. No significant interim change. Small left pleural effusion again noted. Electronically Signed   By: TMarcello Moores Register   On: 05/17/2019 12:20   ECHOCARDIOGRAM COMPLETE BUBBLE STUDY  Result Date: 06/03/2019   ECHOCARDIOGRAM REPORT   Patient Name:   SROSENDO COUSERDate of Exam: 06/03/2019 Medical Rec #:  [id number]   Height:       68.0 in Accession #:    [id number]  Weight:       246.0 lb Date of Birth:  7June 22, 1972   BSA:          2.23 m Patient Age:    89years     BP:           111/37 mmHg Patient Gender: M            HR:           97 bpm. Exam Location:  [facility] Procedure: 2D  Echo Indications:     STROKE 434.91/I63.9  History:         Patient has no prior history of Echocardiogram examinations.                  Risk Factors:Hypertension and Diabetes. ESRD.  Sonographer:     Avanell Shackleton Referring Phys:  [place] Diagnosing Phys: Serafina Royals MD  Sonographer Comments: Technically challenging study due to limited acoustic windows, Technically difficult  study due to poor echo windows, suboptimal parasternal window and suboptimal apical window. Image acquisition challenging due to patient body habitus. PT COULD NOT TURN ON HIS LEFT SIDE. BUBBLE STUDY WAS ATTEMPTED TWICE IMPRESSIONS  1. Left ventricular ejection fraction, by visual estimation, is 60 to 65%. The left ventricle has normal function. There is no left ventricular hypertrophy.  2. The left ventricle has no regional wall motion abnormalities.  3. Global right ventricle has normal systolic function.The right ventricular size is normal. No increase in right ventricular wall thickness.  4. Left atrial size was normal.  5. Right atrial size was normal.  6. The mitral valve is normal in structure. Mild mitral valve regurgitation.  7. The tricuspid valve is normal in structure. Tricuspid valve regurgitation is mild.  8. The aortic valve is normal in structure. Aortic valve regurgitation is mild.  9. The pulmonic valve was normal in structure. Pulmonic valve regurgitation is not visualized. FINDINGS  Left Ventricle: Left ventricular ejection fraction, by visual estimation, is 60 to 65%. The left ventricle has normal function. The left ventricle has no regional wall motion abnormalities. There is no left ventricular hypertrophy. Right Ventricle: The right ventricular size is normal. No increase in right ventricular wall thickness. Global RV systolic function is has normal systolic function. Left Atrium: Left atrial size was normal in size. Right Atrium: Right atrial size was normal in size Pericardium: There is no evidence of pericardial effusion. Mitral Valve: The mitral valve is normal in structure. Mild mitral valve regurgitation. Tricuspid Valve: The tricuspid valve is normal in structure. Tricuspid valve regurgitation is mild. Aortic Valve: The aortic valve is normal in structure. Aortic valve regurgitation is mild. Aortic valve mean gradient measures 8.0 mmHg. Aortic valve peak gradient measures 12.7 mmHg.  Aortic valve area, by VTI measures 2.41 cm. Pulmonic Valve: The pulmonic valve was normal in structure. Pulmonic valve regurgitation is not visualized. Pulmonic regurgitation is not visualized. Aorta: The aortic root, ascending aorta and aortic arch are all structurally normal, with no evidence of dilitation or obstruction. IAS/Shunts: No atrial level shunt detected by color flow Doppler. Agitated saline contrast was given intravenously to evaluate for intracardiac shunting.  LEFT VENTRICLE PLAX 2D LVIDd:         4.74 cm LVIDs:         2.81 cm LV PW:         1.20 cm LV IVS:        1.47 cm LVOT diam:     2.00 cm LV SV:         75 ml LV SV Index:   31.60 LVOT Area:     3.14 cm  IVC IVC diam: 2.10 cm LEFT ATRIUM            Index LA diam:      5.30 cm  2.37 cm/m LA Vol (A4C): 114.0 ml 51.08 ml/m  AORTIC VALVE AV Area (Vmax):    2.72 cm AV Area (Vmean):   2.51 cm AV Area (VTI):  2.41 cm AV Vmax:           178.00 cm/s AV Vmean:          129.000 cm/s AV VTI:            0.319 m AV Peak Grad:      12.7 mmHg AV Mean Grad:      8.0 mmHg LVOT Vmax:         154.00 cm/s LVOT Vmean:        103.000 cm/s LVOT VTI:          0.245 m LVOT/AV VTI ratio: 0.77  AORTA Ao Root diam: 3.10 cm MITRAL VALVE MV Area (PHT): 7.16 cm              SHUNTS MV PHT:        30.74 msec            Systemic VTI:  0.24 m MV Decel Time: 106 msec              Systemic Diam: 2.00 cm MV E velocity: 160.00 cm/s 103 cm/s MV A velocity: 171.00 cm/s 70.3 cm/s MV E/A ratio:  0.94        1.5  Serafina Royals MD Electronically signed by Serafina Royals MD Signature Date/Time: 06/03/2019/2:08:13 PM    Final      Medications:   . sodium chloride 250 mL (05/18/2019 2119)  . dexmedetomidine (PRECEDEX) IV infusion    . fentaNYL infusion INTRAVENOUS 225 mcg/hr (06/05/19 1040)  . norepinephrine (LEVOPHED) Adult infusion 3 mcg/min (06/05/19 0201)  . piperacillin-tazobactam 3.375 g (06/05/19 0522)  . prismasol BGK 2/2.5 dialysis solution 2,000 mL/hr at  06/05/19 0838  . prismasol BGK 2/2.5 replacement solution 300 mL/hr at 06/05/19 0953  . prismasol BGK 2/2.5 replacement solution 500 mL/hr at 06/05/19 0407  . vancomycin 1,000 mg (06/11/2019 1935)   .  stroke: mapping our early stages of recovery book   Does not apply Once  . aspirin  324 mg Per Tube Daily  . atorvastatin  80 mg Per Tube q morning - 10a  . chlorhexidine gluconate (MEDLINE KIT)  15 mL Mouth Rinse BID  . Chlorhexidine Gluconate Cloth  6 each Topical Q0600  . insulin aspart  0-9 Units Subcutaneous TID WC  . mouth rinse  15 mL Mouth Rinse 10 times per day  . pantoprazole sodium  40 mg Per Tube Daily  . sodium chloride flush  10-40 mL Intracatheter Q12H   acetaminophen **OR** acetaminophen, ipratropium-albuterol, midazolam, ondansetron **OR** ondansetron (ZOFRAN) IV, sodium chloride, sodium chloride flush, vecuronium  Assessment/ Plan:  [name] is a 48 y.o. Hispanic male with end stage renal disease, diabetes mellitus type I, hypertension, sleep apnea who was admitted to [facility] on 05/19/2019 for 06/13/2019  [facility] M[facility] [address] RIJ 120kg  1. End stage renal disease on hemodialysis with hyperkalemia: currently not hemodynamically stable for intermittent hemodialysis.  Complication of dialysis device, concern for tunneled catheter infection, now removed and tip sent for culture.  Currently on vasopressors: norepinephrine - Continue CRRT.   2. Hypotension: with sepsis and pneumonia - broad spectrum antibiotics: vanco and zosyn - norepinephrine .  3. Pneumonia: with acute respiratory failure requiring intubation and mechanical ventilation. Secondary to pneumonia - Appreciate critical care input.   4. Anemia of chronic kidney disease: macrocytic - holding EPO  5. Secondary Hyperparathyroidism with hypocalcemia and hyperphosphatemia.  - discontinued calcium acetate - calcium gluconate IV   LOS: 3 Khan Chura 12/22/202011:00 AM

## 2019-06-05 NOTE — Progress Notes (Signed)
eeg completed ° °

## 2019-06-06 ENCOUNTER — Inpatient Hospital Stay (HOSPITAL_COMMUNITY)
Admit: 2019-06-06 | Discharge: 2019-06-06 | Disposition: A | Discharge status: Home / Self Care | Payer: Medicare Other | Attending: Internal Medicine | Admitting: Internal Medicine

## 2019-06-06 DIAGNOSIS — R0689 Other abnormalities of breathing: Secondary | ICD-10-CM

## 2019-06-06 DIAGNOSIS — I340 Nonrheumatic mitral (valve) insufficiency: Secondary | ICD-10-CM

## 2019-06-06 DIAGNOSIS — I639 Cerebral infarction, unspecified: Secondary | ICD-10-CM

## 2019-06-06 DIAGNOSIS — I63423 Cerebral infarction due to embolism of bilateral anterior cerebral arteries: Secondary | ICD-10-CM

## 2019-06-06 DIAGNOSIS — I361 Nonrheumatic tricuspid (valve) insufficiency: Secondary | ICD-10-CM

## 2019-06-06 DIAGNOSIS — R4182 Altered mental status, unspecified: Secondary | ICD-10-CM

## 2019-06-06 LAB — GLUCOSE, CAPILLARY
Glucose-Capillary: 134 mg/dL — ABNORMAL HIGH (ref 70–99)
Glucose-Capillary: 165 mg/dL — ABNORMAL HIGH (ref 70–99)
Glucose-Capillary: 180 mg/dL — ABNORMAL HIGH (ref 70–99)
Glucose-Capillary: 181 mg/dL — ABNORMAL HIGH (ref 70–99)
Glucose-Capillary: 161 mg/dL — ABNORMAL HIGH (ref 70–99)
Glucose-Capillary: 155 mg/dL — ABNORMAL HIGH (ref 70–99)
Glucose-Capillary: 181 mg/dL — ABNORMAL HIGH (ref 70–99)

## 2019-06-06 LAB — BASIC METABOLIC PANEL
Anion gap: 10 (ref 5–15)
Anion gap: 12 (ref 5–15)
Anion gap: 10 (ref 5–15)
Anion gap: 8 (ref 5–15)
BUN: 38 mg/dL — ABNORMAL HIGH (ref 6–20)
BUN: 36 mg/dL — ABNORMAL HIGH (ref 6–20)
BUN: 33 mg/dL — ABNORMAL HIGH (ref 6–20)
BUN: 32 mg/dL — ABNORMAL HIGH (ref 6–20)
CO2: 26 mmol/L (ref 22–32)
CO2: 23 mmol/L (ref 22–32)
CO2: 25 mmol/L (ref 22–32)
CO2: 27 mmol/L (ref 22–32)
Calcium: 8.4 mg/dL — ABNORMAL LOW (ref 8.9–10.3)
Calcium: 8.7 mg/dL — ABNORMAL LOW (ref 8.9–10.3)
Calcium: 8.6 mg/dL — ABNORMAL LOW (ref 8.9–10.3)
Calcium: 8.6 mg/dL — ABNORMAL LOW (ref 8.9–10.3)
Chloride: 99 mmol/L (ref 98–111)
Chloride: 98 mmol/L (ref 98–111)
Chloride: 98 mmol/L (ref 98–111)
Chloride: 99 mmol/L (ref 98–111)
Creatinine, Ser: 3.63 mg/dL — ABNORMAL HIGH (ref 0.61–1.24)
Creatinine, Ser: 3.51 mg/dL — ABNORMAL HIGH (ref 0.61–1.24)
Creatinine, Ser: 3.14 mg/dL — ABNORMAL HIGH (ref 0.61–1.24)
Creatinine, Ser: 2.91 mg/dL — ABNORMAL HIGH (ref 0.61–1.24)
GFR calc Af Amer: 22 mL/min — ABNORMAL LOW (ref >60)
GFR calc Af Amer: 23 mL/min — ABNORMAL LOW (ref >60)
GFR calc Af Amer: 26 mL/min — ABNORMAL LOW (ref >60)
GFR calc Af Amer: 29 mL/min — ABNORMAL LOW (ref >60)
GFR calc non Af Amer: 19 mL/min — ABNORMAL LOW (ref >60)
GFR calc non Af Amer: 20 mL/min — ABNORMAL LOW (ref >60)
GFR calc non Af Amer: 23 mL/min — ABNORMAL LOW (ref >60)
GFR calc non Af Amer: 25 mL/min — ABNORMAL LOW (ref >60)
Glucose, Bld: 157 mg/dL — ABNORMAL HIGH (ref 70–99)
Glucose, Bld: 194 mg/dL — ABNORMAL HIGH (ref 70–99)
Glucose, Bld: 198 mg/dL — ABNORMAL HIGH (ref 70–99)
Glucose, Bld: 185 mg/dL — ABNORMAL HIGH (ref 70–99)
Potassium: 3.8 mmol/L (ref 3.5–5.1)
Potassium: 3.7 mmol/L (ref 3.5–5.1)
Potassium: 3.5 mmol/L (ref 3.5–5.1)
Potassium: 3.4 mmol/L — ABNORMAL LOW (ref 3.5–5.1)
Sodium: 135 mmol/L (ref 135–145)
Sodium: 133 mmol/L — ABNORMAL LOW (ref 135–145)
Sodium: 133 mmol/L — ABNORMAL LOW (ref 135–145)
Sodium: 134 mmol/L — ABNORMAL LOW (ref 135–145)

## 2019-06-06 LAB — HEPATIC FUNCTION PANEL
ALT: 59 U/L — ABNORMAL HIGH (ref 0–44)
AST: 97 U/L — ABNORMAL HIGH (ref 15–41)
Albumin: 2.5 g/dL — ABNORMAL LOW (ref 3.5–5.0)
Alkaline Phosphatase: 278 U/L — ABNORMAL HIGH (ref 38–126)
Bilirubin, Direct: 1.9 mg/dL — ABNORMAL HIGH (ref 0.0–0.2)
Indirect Bilirubin: 1.3 mg/dL — ABNORMAL HIGH (ref 0.3–0.9)
Total Bilirubin: 3.2 mg/dL — ABNORMAL HIGH (ref 0.3–1.2)
Total Protein: 6.6 g/dL (ref 6.5–8.1)

## 2019-06-06 LAB — ECHOCARDIOGRAM COMPLETE
Height: 67.992 in
Weight: 4363.34 oz

## 2019-06-06 LAB — CBC
HCT: 24.2 % — ABNORMAL LOW (ref 39.0–52.0)
Hemoglobin: 7.5 g/dL — ABNORMAL LOW (ref 13.0–17.0)
MCH: 32.3 pg (ref 26.0–34.0)
MCHC: 31 g/dL (ref 30.0–36.0)
MCV: 104.3 fL — ABNORMAL HIGH (ref 80.0–100.0)
Platelets: 89 10*3/uL — ABNORMAL LOW (ref 150–400)
RBC: 2.32 MIL/uL — ABNORMAL LOW (ref 4.22–5.81)
RDW: 14.7 % (ref 11.5–15.5)
WBC: 19.1 10*3/uL — ABNORMAL HIGH (ref 4.0–10.5)
nRBC: 0 % (ref 0.0–0.2)

## 2019-06-06 LAB — LACTIC ACID, PLASMA: Lactic Acid, Venous: 1.1 mmol/L (ref 0.5–1.9)

## 2019-06-06 LAB — MAGNESIUM
Magnesium: 2 mg/dL (ref 1.7–2.4)
Magnesium: 1.9 mg/dL (ref 1.7–2.4)
Magnesium: 2 mg/dL (ref 1.7–2.4)
Magnesium: 1.9 mg/dL (ref 1.7–2.4)

## 2019-06-06 LAB — LYME DISEASE DNA BY PCR(BORRELIA BURG): Lyme Disease(B.burgdorferi)PCR: NEGATIVE

## 2019-06-06 LAB — MYCOPLASMA PNEUMONIAE ANTIBODY, IGM: Mycoplasma pneumo IgM: <770 U/mL (ref 0–769)

## 2019-06-06 LAB — PROTIME-INR
INR: 1 (ref 0.8–1.2)
Prothrombin Time: 13.2 seconds (ref 11.4–15.2)

## 2019-06-06 MED ORDER — INSULIN ASPART 100 UNIT/ML ~~LOC~~ SOLN
0.0000 [IU] | SUBCUTANEOUS | Status: DC
  Administered 2019-06-06 – 2019-06-08 (×10): 2 [IU] via SUBCUTANEOUS
  Administered 2019-06-08: 1 [IU] via SUBCUTANEOUS
  Administered 2019-06-08: 2 [IU] via SUBCUTANEOUS
  Administered 2019-06-08: 1 [IU] via SUBCUTANEOUS
  Administered 2019-06-08 – 2019-06-09 (×3): 2 [IU] via SUBCUTANEOUS
  Administered 2019-06-09: 3 [IU] via SUBCUTANEOUS
  Administered 2019-06-09 (×2): 2 [IU] via SUBCUTANEOUS
  Administered 2019-06-10 (×3): 3 [IU] via SUBCUTANEOUS
  Administered 2019-06-10: 2 [IU] via SUBCUTANEOUS
  Administered 2019-06-10: 3 [IU] via SUBCUTANEOUS
  Administered 2019-06-10: 2 [IU] via SUBCUTANEOUS
  Administered 2019-06-10: 3 [IU] via SUBCUTANEOUS
  Administered 2019-06-11: 2 [IU] via SUBCUTANEOUS
  Administered 2019-06-11 (×2): 3 [IU] via SUBCUTANEOUS
  Administered 2019-06-11: 5 [IU] via SUBCUTANEOUS
  Administered 2019-06-11 – 2019-06-12 (×3): 3 [IU] via SUBCUTANEOUS
  Administered 2019-06-12: 5 [IU] via SUBCUTANEOUS
  Administered 2019-06-12: 2 [IU] via SUBCUTANEOUS
  Administered 2019-06-12 (×2): 3 [IU] via SUBCUTANEOUS
  Administered 2019-06-13: 20:00:00 2 [IU] via SUBCUTANEOUS
  Administered 2019-06-13 (×4): 3 [IU] via SUBCUTANEOUS
  Administered 2019-06-13: 2 [IU] via SUBCUTANEOUS
  Administered 2019-06-14: 3 [IU] via SUBCUTANEOUS
  Administered 2019-06-14 (×3): 2 [IU] via SUBCUTANEOUS
  Administered 2019-06-14 – 2019-06-15 (×3): 3 [IU] via SUBCUTANEOUS
  Administered 2019-06-15: 1 [IU] via SUBCUTANEOUS
  Administered 2019-06-15 (×2): 3 [IU] via SUBCUTANEOUS
  Administered 2019-06-15 – 2019-06-16 (×3): 2 [IU] via SUBCUTANEOUS
  Administered 2019-06-16 (×2): 3 [IU] via SUBCUTANEOUS
  Administered 2019-06-16 – 2019-06-17 (×4): 2 [IU] via SUBCUTANEOUS
  Administered 2019-06-17: 3 [IU] via SUBCUTANEOUS
  Administered 2019-06-17 (×4): 2 [IU] via SUBCUTANEOUS
  Administered 2019-06-17 – 2019-06-18 (×3): 3 [IU] via SUBCUTANEOUS
  Administered 2019-06-18 (×4): 2 [IU] via SUBCUTANEOUS
  Administered 2019-06-19 (×2): 1 [IU] via SUBCUTANEOUS
  Administered 2019-06-19 (×2): 3 [IU] via SUBCUTANEOUS
  Administered 2019-06-19 – 2019-06-20 (×4): 2 [IU] via SUBCUTANEOUS
  Administered 2019-06-20: 16:00:00 1 [IU] via SUBCUTANEOUS
  Administered 2019-06-20 – 2019-06-21 (×2): 2 [IU] via SUBCUTANEOUS
  Administered 2019-06-21: 1 [IU] via SUBCUTANEOUS
  Administered 2019-06-21 – 2019-06-22 (×3): 2 [IU] via SUBCUTANEOUS
  Administered 2019-06-22: 1 [IU] via SUBCUTANEOUS
  Administered 2019-06-22 (×3): 2 [IU] via SUBCUTANEOUS
  Administered 2019-06-22 – 2019-06-25 (×7): 1 [IU] via SUBCUTANEOUS
  Filled 2019-06-06 (×97): qty 1

## 2019-06-06 MED ORDER — HEPARIN SODIUM (PORCINE) 1000 UNIT/ML DIALYSIS
1000.0000 [IU] | INTRAMUSCULAR | Status: DC | PRN
  Filled 2019-06-06: qty 6
  Filled 2019-06-06: qty 3
  Filled 2019-06-06: qty 1

## 2019-06-06 MED ORDER — PERFLUTREN LIPID MICROSPHERE
1.0000 mL | INTRAVENOUS | Status: AC | PRN
  Administered 2019-06-06: 2 mL via INTRAVENOUS
  Filled 2019-06-06: qty 10

## 2019-06-06 MED ORDER — DOCUSATE SODIUM 50 MG/5ML PO LIQD
100.0000 mg | Freq: Two times a day (BID) | ORAL | Status: DC
  Administered 2019-06-06 – 2019-06-07 (×3): 100 mg
  Filled 2019-06-06 (×3): qty 10

## 2019-06-06 MED ORDER — SODIUM CHLORIDE 0.9 % IV SOLN
3.0000 g | Freq: Three times a day (TID) | INTRAVENOUS | Status: DC
  Administered 2019-06-06 – 2019-06-07 (×2): 3 g via INTRAVENOUS
  Filled 2019-06-06: qty 3
  Filled 2019-06-06 (×2): qty 8
  Filled 2019-06-06: qty 3

## 2019-06-06 MED ORDER — POLYETHYLENE GLYCOL 3350 17 G PO PACK
17.0000 g | PACK | Freq: Every day | ORAL | Status: DC
  Administered 2019-06-06 – 2019-06-07 (×2): 17 g
  Filled 2019-06-06 (×2): qty 1

## 2019-06-06 MED ORDER — SODIUM CHLORIDE 0.9 % IV SOLN
3.0000 g | Freq: Two times a day (BID) | INTRAVENOUS | Status: DC
  Administered 2019-06-06: 3 g via INTRAVENOUS
  Filled 2019-06-06: qty 8
  Filled 2019-06-06: qty 3

## 2019-06-06 NOTE — Progress Notes (Signed)
*  PRELIMINARY RESULTS* Echocardiogram 2D Echocardiogram has been performed.  Jake Gomez 06/06/2019, 10:04 AM

## 2019-06-06 NOTE — Progress Notes (Addendum)
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   CC  follow up respiratory failure  SUBJECTIVE Patient remains critically ill Prognosis is guarded On CRRT and on Vasopressors   BP (!) 135/54   Pulse 83   Temp 100.3 F (37.9 C) (Axillary)   Resp 15   Ht 5' 7.99" (1.727 m)   Wt 123.7 kg   SpO2 98%   BMI 41.47 kg/m    I/O last 3 completed shifts: In: 3162.8 [I.V.:1722.8; NG/GT:740; IV ELFYBOFBP:102] Out: 2329 [Other:2329] Total I/O In: 97.2 [I.V.:37.2; NG/GT:60] Out: 105 [Other:105]  SpO2: 98 % O2 Flow Rate (L/min): 3 L/min FiO2 (%): 30 %   SIGNIFICANT EVENTS 12/21 Intubated, resp failure, acute CVA Perm cath removed, vasc cath LEFT groin, Triple lumen RT groin placed 12/22 on CRRT, septic shock, severe hypoxia, on vent, unable to wean due to hemodynamic instability 12/23 attemt neuro assessment today, follow up NEURO recs  REVIEW OF SYSTEMS  PATIENT IS UNABLE TO PROVIDE COMPLETE REVIEW OF SYSTEMS DUE TO SEVERE CRITICAL ILLNESS   PHYSICAL EXAMINATION:  GENERAL:critically ill appearing, +resp distress HEAD: Normocephalic, atraumatic.  EYES: Pupils equal, round, reactive to light.  No scleral icterus.  MOUTH: Moist mucosal membrane. NECK: Supple.  PULMONARY: +rhonchi, +wheezing CARDIOVASCULAR: S1 and S2. Regular rate and rhythm. No murmurs, rubs, or gallops.  GASTROINTESTINAL: Soft, nontender, -distended. No masses. Positive bowel sounds. No hepatosplenomegaly.  MUSCULOSKELETAL: No swelling, clubbing, or edema.  NEUROLOGIC: obtunded, GCS<8 SKIN:intact,warm,dry  MEDICATIONS: I have reviewed all medications and confirmed regimen as documented   CULTURE RESULTS   Recent Results (from the past 240 hour(s))  Respiratory Panel by RT PCR (Flu A&B, Covid) - Nasopharyngeal Swab     Status: None   Collection Time: 06/03/2019  2:35 AM   Specimen:  Nasopharyngeal Swab  Result Value Ref Range Status   SARS Coronavirus 2 by RT PCR NEGATIVE NEGATIVE Final    Comment: (NOTE) SARS-CoV-2 target nucleic acids are NOT DETECTED. The SARS-CoV-2 RNA is generally detectable in upper respiratoy specimens during the acute phase of infection. The lowest concentration of SARS-CoV-2 viral copies this assay can detect is 131 copies/mL. A negative result does not preclude SARS-Cov-2 infection and should not be used as the sole basis for treatment or other patient management decisions. A negative result may occur with  improper specimen collection/handling, submission of specimen other than nasopharyngeal swab, presence of viral mutation(s) within the areas targeted by this assay, and inadequate number of viral copies (<131 copies/mL). A negative result must be combined with clinical observations, patient history, and epidemiological information. The expected result is Negative. Fact Sheet for Patients:  PinkCheek.be Fact Sheet for Healthcare Providers:  GravelBags.it This test is not yet ap proved or cleared by the [place] FDA and  has been authorized for detection and/or diagnosis of SARS-CoV-2 by FDA under an Emergency Use Authorization (EUA). This EUA will remain  in effect (meaning this test can be used) for the duration of the COVID-19 declaration under Section 564(b)(1) of the Act, 21 U.S.C. section 360bbb-3(b)(1), unless the authorization is terminated or revoked sooner.    Influenza A by PCR NEGATIVE NEGATIVE Final   Influenza B by PCR NEGATIVE NEGATIVE Final    Comment: (NOTE) The Xpert Xpress SARS-CoV-2/FLU/RSV assay is intended as an aid in  the diagnosis of influenza from Nasopharyngeal swab specimens and  should not be  used as a sole basis for treatment. Nasal washings and  aspirates are unacceptable for Xpert Xpress SARS-CoV-2/FLU/RSV  testing. Fact Sheet for  Patients: PinkCheek.be Fact Sheet for Healthcare Providers: GravelBags.it This test is not yet approved or cleared by the [place] FDA and  has been authorized for detection and/or diagnosis of SARS-CoV-2 by  FDA under an Emergency Use Authorization (EUA). This EUA will remain  in effect (meaning this test can be used) for the duration of the  Covid-19 declaration under Section 564(b)(1) of the Act, 21  U.S.C. section 360bbb-3(b)(1), unless the authorization is  terminated or revoked. Performed at [facility], [address] [place], [place] [address]   Culture, blood (routine x 2)     Status: None (Preliminary result)   Collection Time: 05/26/2019  2:36 AM   Specimen: BLOOD  Result Value Ref Range Status   Specimen Description BLOOD LEFT ANTECUBITAL  Final   Special Requests   Final    BOTTLES DRAWN AEROBIC AND ANAEROBIC Blood Culture adequate volume   Culture   Final    NO GROWTH 4 DAYS Performed at [facility], [address], [place], [place] [address]    Report Status PENDING  Incomplete  Culture, blood (routine x 2)     Status: None (Preliminary result)   Collection Time: 05/17/2019  2:36 AM   Specimen: BLOOD  Result Value Ref Range Status   Specimen Description BLOOD RIGHT FOREARM  Final   Special Requests   Final    BOTTLES DRAWN AEROBIC AND ANAEROBIC Blood Culture adequate volume   Culture   Final    NO GROWTH 4 DAYS Performed at [facility], [address] [place], [place] [address]    Report Status PENDING  Incomplete  Culture, blood (routine x 2)     Status: None (Preliminary result)   Collection Time: 06/10/2019  3:10 PM   Specimen: BLOOD  Result Value Ref Range Status   Specimen Description BLOOD A-LINE  Final   Special Requests   Final    BOTTLES DRAWN AEROBIC AND ANAEROBIC Blood Culture results may not be optimal due to an excessive volume of blood received in culture  bottles   Culture   Final    NO GROWTH 4 DAYS Performed at [facility], [address] [place], [place] [address]    Report Status PENDING  Incomplete  Culture, blood (routine x 2)     Status: None (Preliminary result)   Collection Time: 05/25/2019  3:10 PM   Specimen: BLOOD  Result Value Ref Range Status   Specimen Description BLOOD A-LINE  Final   Special Requests   Final    BOTTLES DRAWN AEROBIC AND ANAEROBIC Blood Culture results may not be optimal due to an excessive volume of blood received in culture bottles   Culture   Final    NO GROWTH 4 DAYS Performed at [facility], [address] [place], [place] [address]    Report Status PENDING  Incomplete  MRSA PCR Screening     Status: None   Collection Time: 06/03/19  5:45 PM   Specimen: Nasopharyngeal  Result Value Ref Range Status   MRSA by PCR NEGATIVE NEGATIVE Final    Comment:        The GeneXpert MRSA Assay (FDA approved for NASAL specimens only), is one component of a comprehensive MRSA colonization surveillance program. It is not intended to diagnose MRSA infection nor to guide or monitor treatment for MRSA  infections. Performed at [facility], [address], [place], [place] [address]   Urine Culture     Status: None   Collection Time: 06/03/19  6:20 PM   Specimen: Urine, Catheterized  Result Value Ref Range Status   Specimen Description   Final    URINE, CATHETERIZED Performed at [facility], [address], [place], [place] [address]    Special Requests   Final    Immunocompromised Performed at [facility], [address] [place], [place] [address]    Culture   Final    NO GROWTH Performed at [facility], [place] [address] [place], [place] [address]    Report Status 06/05/2019 FINAL  Final  Cath Tip Culture     Status: None (Preliminary result)   Collection Time: 05/27/2019  4:32 PM   Specimen: Catheter Tip; Other  Result Value Ref Range  Status   Specimen Description   Final    CATH TIP Performed at [facility], [address] [place], [place] [address]    Special Requests   Final    NONE Performed at [facility], [address], [place], [place] [address]    Culture   Final    NO GROWTH < 12 HOURS Performed at [facility], [place] [address], [place], [place] [address]    Report Status PENDING  Incomplete           Indwelling Urinary Catheter continued, requirement due to   Reason to continue Indwelling Urinary Catheter strict Intake/Output monitoring for hemodynamic instability   Central Line/ continued, requirement due to  Reason to continue [place] of central venous pressure or other hemodynamic parameters and poor IV access   Ventilator continued, requirement due to severe respiratory failure   Ventilator Sedation RASS 0 to -2      ASSESSMENT AND PLAN SYNOPSIS  Severe ACUTE Hypoxic and Hypercapnic Respiratory Failure from pneumonia and acute aspirtaion pneumonia with inability to protect airway due to CVA, septic shock     Severe ACUTE Hypoxic and Hypercapnic Respiratory Failure -continue Full MV support -continue Bronchodilator Therapy -Wean Fio2 and PEEP as tolerated -will perform SAT/SBT when respiratory parameters are met   KIDNEY INJURY/Renal Failure -follow chem 7 -follow UO -continue Foley Catheter-assess need -Avoid nephrotoxic agents -Recheck creatinine  On CRRT   NEUROLOGY - intubated and sedated - minimal sedation to achieve a RASS goal: -1 Wake up assessment pending ACUTE CVA Follow up NEURO RECS    SHOCK-SEPSIS/HYPOVOLUMIC/CARDIOGENIC -use vasopressors to keep MAP>65 -follow ABG and LA -follow up cultures -emperic ABX  CARDIAC ICU monitoring Acute AFIB with RVR On amiodarone infusion ECHO WNL  ID -continue IV abx as prescibed -follow up cultures Presumed infected PERM cath removed blood cultures No  growth   GI GI PROPHYLAXIS as indicated  NUTRITIONAL STATUS DIET-->TF's as tolerated Constipation protocol as indicated  ENDO - will use ICU hypoglycemic\Hyperglycemia protocol if indicated   ELECTROLYTES -follow labs as needed -replace as needed -pharmacy consultation and following   DVT/GI PRX ordered TRANSFUSIONS AS NEEDED MONITOR FSBS ASSESS the need for LABS as needed   Critical Care Time devoted to patient care services described in this note is 34 minutes.   Overall, patient is critically ill, prognosis is guarded.  Patient with Multiorgan failure and at high risk for cardiac arrest and death.   I anticipate prolonged ICU LOS and vent support Patient may end up with Trach and PEG tube  Corrin Parker, M.D.  [name]  Medical Director [place] Director [facility] Cardio-Pulmonary Department

## 2019-06-06 NOTE — Progress Notes (Signed)
Shift summary:  - Upon AM assessment, patient is intubated/sedated, on CRRT, pressors off. NAD.

## 2019-06-06 NOTE — Progress Notes (Signed)
Pharmacy has reviewed patient medications for CRRT. No adjustments necessary today.  Gerald Dexter, PharmD Pharmacy Resident  06/06/2019 11:26 AM

## 2019-06-06 NOTE — Progress Notes (Signed)
[facility]  ROUNDING NOTE   Subjective:   CRRT. UF of 2400  Tmax 100.3  Off norepinephrine this morning.   Objective:  Vital signs in last 24 hours:  Temp:  [98.2 F (36.8 C)-100.3 F (37.9 C)] 100.1 F (37.8 C) (12/23 1134) Pulse Rate:  [70-159] 81 (12/23 1134) Resp:  [10-20] 15 (12/23 1134) BP: (87-161)/(36-89) 158/57 (12/23 1130) SpO2:  [94 %-100 %] 98 % (12/23 1134) FiO2 (%):  [30 %] 30 % (12/23 1055) Weight:  [123.7 kg] 123.7 kg (12/23 0424)  Weight change: 1.8 kg Filed Weights   06/03/19 0855 06/05/19 0448 06/06/19 0424  Weight: 111.6 kg 121.9 kg 123.7 kg    Intake/Output: I/O last 3 completed shifts: In: 3162.8 [I.V.:1722.8; NG/GT:740; IV PIRJJOACZ:660] Out: 2329 [Other:2329]   Intake/Output this shift:  Total I/O In: 437.2 [I.V.:120; Other:15; NG/GT:300; IV Piggyback:2.2] Out: 630 [Other:539]  Physical Exam: General: Critically ill   Head: ETT   Eyes: Anicteric, PERRL  Neck:  trachea midline  Lungs:  PRVC FiO2 30%  Heart: regular  Abdomen:  Soft, nontender, obese  Extremities: ++ peripheral edema.  Neurologic: Intubated and sedated  Skin: No lesions  Access: Left femoral temp HD catheter 12/21 Dr. Lucky Cowboy    Basic Metabolic Panel: Recent Labs  Lab 05/28/2019 0456 06/08/2019 1744 06/09/2019 1744 06/05/19 0455 06/05/19 1018 06/05/19 1703 06/05/19 2251 06/06/19 0446 06/06/19 1022  NA 143 136  --  137 137 134* 134* 135 133*  K 6.4* 5.5*  --  4.5 4.1 3.8 3.9 3.8 3.7  CL 101 95*  --  98 100 99 98 99 98  CO2 21* 21*  --  23 25 21* _0 GLUCOSE 227* 113*  --  91 79 116* 161* 157* 194*  BUN 123* 100*  --  69* 55* 44* 40* 38* 36*  CREATININE 15.46* 11.70*  --  7.31* 6.30* 5.07* 4.17* 3.63* 3.51*  CALCIUM 7.2* 6.5*  --  7.7* 8.0* 8.0* 8.2* 8.4* 8.7*  MG  --   --    < > 2.1 1.9 1.9 2.0 2.0 1.9  PHOS 7.9* 8.1*  --  6.0* 5.0* 4.6  --   --   --    < > = values in this interval not displayed.    Liver Function Tests: Recent Labs  Lab  06/01/2019 0048 05/15/2019 1941 05/29/2019 1744 06/05/19 0455 06/05/19 1018 06/05/19 1703 06/06/19 1022  AST 27 109*  --   --   --   --  97*  ALT 28 71*  --   --   --   --  59*  ALKPHOS 190* 318*  --   --   --   --  278*  BILITOT 0.8 2.3*  --   --   --   --  3.2*  PROT 8.0 7.4  --   --   --   --  6.6  ALBUMIN 4.0 3.7 2.9* 2.8* 2.7* 2.6* 2.5*   No results for input(s): LIPASE, AMYLASE in the last 168 hours. No results for input(s): AMMONIA in the last 168 hours.  CBC: Recent Labs  Lab 05/27/2019 0048 05/30/2019 1941 06/03/19 1136 05/17/2019 0343 06/05/19 0449 06/06/19 0446  WBC 7.6 1.8* 19.6* 33.0* 31.3* 19.1*  NEUTROABS 4.9 1.3*  --   --   --   --   HGB 9.3* 8.6* 9.3* 8.6* 7.8* 7.5*  HCT 29.3* 26.8* 28.2* 27.3* 24.1* 24.2*  MCV 102.4* 101.5* 99.3 102.2* 103.0* 104.3*  PLT 118* 66* 47* 58* 83* 89*    Cardiac Enzymes: No results for input(s): CKTOTAL, CKMB, CKMBINDEX, TROPONINI in the last 168 hours.  BNP: Invalid input(s): POCBNP  CBG: Recent Labs  Lab 06/05/19 1910 06/05/19 2317 06/06/19 0317 06/06/19 0734 06/06/19 1125  GLUCAP 119* 137* 134* 165* 180*    Microbiology: Results for orders placed or performed during the hospital encounter of 06/11/2019  Respiratory Panel by RT PCR (Flu A&B, Covid) - Nasopharyngeal Swab     Status: None   Collection Time: 06/03/2019  2:35 AM   Specimen: Nasopharyngeal Swab  Result Value Ref Range Status   SARS Coronavirus 2 by RT PCR NEGATIVE NEGATIVE Final    Comment: (NOTE) SARS-CoV-2 target nucleic acids are NOT DETECTED. The SARS-CoV-2 RNA is generally detectable in upper respiratoy specimens during the acute phase of infection. The lowest concentration of SARS-CoV-2 viral copies this assay can detect is 131 copies/mL. A negative result does not preclude SARS-Cov-2 infection and should not be used as the sole basis for treatment or other patient management decisions. A negative result may occur with  improper specimen  collection/handling, submission of specimen other than nasopharyngeal swab, presence of viral mutation(s) within the areas targeted by this assay, and inadequate number of viral copies (<131 copies/mL). A negative result must be combined with clinical observations, patient history, and epidemiological information. The expected result is Negative. Fact Sheet for Patients:  PinkCheek.be Fact Sheet for Healthcare Providers:  GravelBags.it This test is not yet ap proved or cleared by the [place] FDA and  has been authorized for detection and/or diagnosis of SARS-CoV-2 by FDA under an Emergency Use Authorization (EUA). This EUA will remain  in effect (meaning this test can be used) for the duration of the COVID-19 declaration under Section 564(b)(1) of the Act, 21 U.S.C. section 360bbb-3(b)(1), unless the authorization is terminated or revoked sooner.    Influenza A by PCR NEGATIVE NEGATIVE Final   Influenza B by PCR NEGATIVE NEGATIVE Final    Comment: (NOTE) The Xpert Xpress SARS-CoV-2/FLU/RSV assay is intended as an aid in  the diagnosis of influenza from Nasopharyngeal swab specimens and  should not be used as a sole basis for treatment. Nasal washings and  aspirates are unacceptable for Xpert Xpress SARS-CoV-2/FLU/RSV  testing. Fact Sheet for Patients: PinkCheek.be Fact Sheet for Healthcare Providers: GravelBags.it This test is not yet approved or cleared by the [place] FDA and  has been authorized for detection and/or diagnosis of SARS-CoV-2 by  FDA under an Emergency Use Authorization (EUA). This EUA will remain  in effect (meaning this test can be used) for the duration of the  Covid-19 declaration under Section 564(b)(1) of the Act, 21  U.S.C. section 360bbb-3(b)(1), unless the authorization is  terminated or revoked. Performed at [facility],  [address] [place], [place] [address]   Culture, blood (routine x 2)     Status: None (Preliminary result)   Collection Time: 05/29/2019  2:36 AM   Specimen: BLOOD  Result Value Ref Range Status   Specimen Description BLOOD LEFT ANTECUBITAL  Final   Special Requests   Final    BOTTLES DRAWN AEROBIC AND ANAEROBIC Blood Culture adequate volume   Culture   Final    NO GROWTH 4 DAYS Performed at [facility], [address] [place], [place] [address]    Report Status PENDING  Incomplete  Culture, blood (routine x 2)     Status: None (Preliminary result)   Collection  Time: 06/03/2019  2:36 AM   Specimen: BLOOD  Result Value Ref Range Status   Specimen Description BLOOD RIGHT FOREARM  Final   Special Requests   Final    BOTTLES DRAWN AEROBIC AND ANAEROBIC Blood Culture adequate volume   Culture   Final    NO GROWTH 4 DAYS Performed at [facility], [address], [place], [place] [address]    Report Status PENDING  Incomplete  Culture, blood (routine x 2)     Status: None (Preliminary result)   Collection Time: 05/26/2019  3:10 PM   Specimen: BLOOD  Result Value Ref Range Status   Specimen Description BLOOD A-LINE  Final   Special Requests   Final    BOTTLES DRAWN AEROBIC AND ANAEROBIC Blood Culture results may not be optimal due to an excessive volume of blood received in culture bottles   Culture   Final    NO GROWTH 4 DAYS Performed at [facility], [address] [place], [place] [address]    Report Status PENDING  Incomplete  Culture, blood (routine x 2)     Status: None (Preliminary result)   Collection Time: 06/14/2019  3:10 PM   Specimen: BLOOD  Result Value Ref Range Status   Specimen Description BLOOD A-LINE  Final   Special Requests   Final    BOTTLES DRAWN AEROBIC AND ANAEROBIC Blood Culture results may not be optimal due to an excessive volume of blood received in culture bottles   Culture   Final    NO GROWTH 4 DAYS Performed at  [facility], [address] [place], [place] [address]    Report Status PENDING  Incomplete  MRSA PCR Screening     Status: None   Collection Time: 06/03/19  5:45 PM   Specimen: Nasopharyngeal  Result Value Ref Range Status   MRSA by PCR NEGATIVE NEGATIVE Final    Comment:        The GeneXpert MRSA Assay (FDA approved for NASAL specimens only), is one component of a comprehensive MRSA colonization surveillance program. It is not intended to diagnose MRSA infection nor to guide or monitor treatment for MRSA infections. Performed at [facility], [address] [place], [place] [address]   Urine Culture     Status: None   Collection Time: 06/03/19  6:20 PM   Specimen: Urine, Catheterized  Result Value Ref Range Status   Specimen Description   Final    URINE, CATHETERIZED Performed at [facility], [address] [place], [place] [address]    Special Requests   Final    Immunocompromised Performed at [facility], [address] [place], [place] [address]    Culture   Final    NO GROWTH Performed at [facility], [place] [address], [place], [place] [address]    Report Status 06/05/2019 FINAL  Final  Cath Tip Culture     Status: None (Preliminary result)   Collection Time: 05/23/2019  4:32 PM   Specimen: Catheter Tip; Other  Result Value Ref Range Status   Specimen Description   Final    CATH TIP Performed at [facility], [address], [place], [place] [address]    Special Requests   Final    NONE Performed at [facility], [address] [place], [place] [address]    Culture   Final    NO GROWTH < 12 HOURS Performed at [facility], [place]  [address] [place], [place] [address]    Report Status PENDING  Incomplete    Coagulation Studies: Recent Labs    06/06/19 0740  LABPROT 13.2  INR 1.0    Urinalysis: No results for input(s): COLORURINE, LABSPEC, PHURINE, GLUCOSEU, HGBUR,  BILIRUBINUR, KETONESUR, PROTEINUR, UROBILINOGEN, NITRITE, LEUKOCYTESUR in the last 72 hours.  Invalid input(s): APPERANCEUR    Imaging: EEG  Result Date: 06/06/2019 Lora Havens, MD     06/06/2019  9:42 AM Patient Name: Jake Gomez MRN: [id number] Epilepsy Attending: Lora Havens Referring Physician/Provider: Dr. [name] Date: 06/05/2019 Duration: 22.30 minutes Patient history: 48 year old male who presented with an episode of syncope and seizure-like activity.  EEG to evaluate for seizures. Level of alertness: Comatose/sedated AEDs during EEG study: None Technical aspects: This EEG study was done with scalp electrodes positioned according to the 10-20 International system of electrode placement. Electrical activity was acquired at a sampling rate of _0  and reviewed with a high frequency filter of _1  and a low frequency filter of _2 . EEG data were recorded continuously and digitally stored. Description: EEG showed continuous generalized 3 to 6 Hz theta-delta slowing.  EEG was reactive to tactile stimulation.  Hyperventilation photic stimulation were not performed. Abnormality -Continuous slow, generalized IMPRESSION: This study is suggestive of moderate to severe diffuse encephalopathy, nonspecific etiology.  No seizures or epileptiform discharges were seen throughout the recording.   DG Abd 1 View  Result Date: 05/15/2019 CLINICAL DATA:  OG tube placement EXAM: ABDOMEN - 1 VIEW COMPARISON:  None. FINDINGS: OG tube tip is at the GE junction with the side port in the distal esophagus. Nonobstructive bowel gas pattern. IMPRESSION: OG tube tip at the GE junction. This could be advanced several cm into the stomach. Electronically Signed   By: Rolm Baptise M.D.   On: 05/20/2019 12:19   PERIPHERAL VASCULAR CATHETERIZATION  Result Date: 06/12/2019 See op note  DG CHEST PORT 1 VIEW  Result Date: 06/07/2019 CLINICAL DATA:  OG tube placement.  Intubation. EXAM: PORTABLE CHEST 1  VIEW COMPARISON:  05/16/2019. FINDINGS: Endotracheal tube noted with its tip 4 cm above the carina. NG tube noted with tip below left hemidiaphragm. Dual-lumen right IJ catheter with tip over SVC. Mild infiltrates noted over the left lung and over the right lung base. No significant interim change. Small left pleural effusion again noted. No pneumothorax. IMPRESSION: 1. Endotracheal tube noted with tip 4 cm above the carina. NG tube noted with tip below left hemidiaphragm. Dual-lumen right IJ catheter with tip over SVC. 2. Mild infiltrates noted over the left lung and over the right lung base. No significant interim change. Small left pleural effusion again noted. Electronically Signed   By: Marcello Moores  Register   On: 05/28/2019 12:20     Medications:   . sodium chloride Stopped (06/06/19 1059)  . amiodarone 30 mg/hr (06/06/19 1100)  . ampicillin-sulbactam (UNASYN) IV    . dexmedetomidine (PRECEDEX) IV infusion    . fentaNYL infusion INTRAVENOUS 100 mcg/hr (06/06/19 1100)  . norepinephrine (LEVOPHED) Adult infusion 3 mcg/min (06/06/19 0019)  . prismasol BGK 2/2.5 dialysis solution 2,000 mL/hr at 06/06/19 1042  . prismasol BGK 2/2.5 replacement solution 300 mL/hr at 06/06/19 0244  . prismasol BGK 2/2.5 replacement solution 500 mL/hr at 06/06/19 1042   .  stroke: mapping our early stages of recovery book   Does not apply Once  . aspirin  324 mg Per Tube Daily  . atorvastatin  80 mg Per Tube q morning - 10a  .  B-complex with vitamin C  1 tablet Per Tube Daily  . chlorhexidine gluconate (MEDLINE KIT)  15 mL Mouth Rinse BID  . Chlorhexidine Gluconate Cloth  6 each Topical Q0600  . docusate  100 mg Per Tube BID  . feeding supplement (PRO-STAT SUGAR FREE 64)  60 mL Per Tube TID  . feeding supplement (VITAL HIGH PROTEIN)  1,000 mL Per Tube Q24H  . insulin aspart  0-9 Units Subcutaneous TID WC  . mouth rinse  15 mL Mouth Rinse 10 times per day  . pantoprazole sodium  40 mg Per Tube Daily  .  polyethylene glycol  17 g Per Tube Daily  . sodium chloride flush  10-40 mL Intracatheter Q12H   acetaminophen **OR** acetaminophen, ipratropium-albuterol, midazolam, ondansetron **OR** ondansetron (ZOFRAN) IV, sodium chloride, sodium chloride flush, vecuronium  Assessment/ Plan:  Mr. Jeric Slagel is a 48 y.o. Hispanic male with end stage renal disease, diabetes mellitus type I, hypertension, sleep apnea who was admitted to [facility] on 05/27/2019 for 05/20/2019  [facility] M[facility] [address] RIJ 120kg  1. End stage renal disease on hemodialysis with hyperkalemia: currently not hemodynamically stable for intermittent hemodialysis.  Complication of dialysis device, concern for tunneled catheter infection, now removed and tip sent for culture.  Currently weaning vasopressors: norepinephrine - Continue CRRT. If hemodynamically stable, will transition to intermittent hemodialysis tomorrow.   2. Hypotension: with sepsis and pneumonia - broad spectrum antibiotics: unasyn - norepinephrine .  3. Pneumonia: with acute respiratory failure requiring intubation and mechanical ventilation. Secondary to pneumonia - Appreciate critical care input.   4. Anemia of chronic kidney disease: macrocytic. Hemoglobin 7.5 - holding EPO  5. Secondary Hyperparathyroidism with hypocalcemia and hyperphosphatemia.  - discontinued calcium acetate - calcium gluconate IV   LOS: 4 Johntay Doolen 12/23/202012:06 PM

## 2019-06-06 NOTE — Plan of Care (Signed)

## 2019-06-06 NOTE — Procedures (Signed)
Patient Name: Jake Gomez  MRN: [id number]  Epilepsy Attending: Lora Havens  Referring Physician/Provider: Dr. [name] Date: 06/05/2019 Duration: 22.30 minutes  Patient history: 48 year old male who presented with an episode of syncope and seizure-like activity.  EEG to evaluate for seizures.  Level of alertness: Comatose/sedated  AEDs during EEG study: None  Technical aspects: This EEG study was done with scalp electrodes positioned according to the 10-20 International system of electrode placement. Electrical activity was acquired at a sampling rate of 500Hz  and reviewed with a high frequency filter of 70Hz  and a low frequency filter of 1Hz . EEG data were recorded continuously and digitally stored.   Description: EEG showed continuous generalized 3 to 6 Hz theta-delta slowing.  EEG was reactive to tactile stimulation.  Hyperventilation photic stimulation were not performed.  Abnormality -Continuous slow, generalized  IMPRESSION: This study is suggestive of moderate to severe diffuse encephalopathy, nonspecific etiology.  No seizures or epileptiform discharges were seen throughout the recording.

## 2019-06-06 NOTE — Consult Note (Signed)
Subjective: No further twitching noted on examination. S/p intubation for hypoxia.   Past Medical History:  Diagnosis Date  . Diabetes mellitus without complication ([place])   . Hypertension   . Renal disorder   . Sleep apnea    can't afford CPAP    Past Surgical History:  Procedure Laterality Date  . AV FISTULA PLACEMENT Left 01/31/2019   Procedure: ARTERIOVENOUS (AV) FISTULA CREATION ( BRACHIOCEPHALIC );  Surgeon: Algernon Huxley, MD;  Location: [facility];  Service: Vascular;  Laterality: Left;  . DIALYSIS/PERMA CATHETER INSERTION N/A 07/19/2018   Procedure: DIALYSIS/PERMA CATHETER INSERTION;  Surgeon: Algernon Huxley, MD;  Location: [place] [facility];  Service: Cardiovascular;  Laterality: N/A;  . DIALYSIS/PERMA CATHETER INSERTION N/A 09/13/2018   Procedure: DIALYSIS/PERMA CATHETER INSERTION;  Surgeon: Katha Cabal, MD;  Location: [place] [facility];  Service: Cardiovascular;  Laterality: N/A;  . DIALYSIS/PERMA CATHETER REMOVAL N/A 06/03/2019   Procedure: DIALYSIS/PERMA CATHETER REMOVAL, temporary dialysis catheter placement;  Surgeon: Algernon Huxley, MD;  Location: [place] [facility];  Service: Cardiovascular;  Laterality: N/A;  . peritonal dialysis    . REMOVAL OF A DIALYSIS CATHETER N/A 07/17/2018   Procedure: REMOVAL OF A DIALYSIS CATHETER;  Surgeon: Algernon Huxley, MD;  Location: [facility];  Service: Vascular;  Laterality: N/A;    Family History  Problem Relation Age of Onset  . Thyroid disease Mother   . Stroke Father   . Cancer Father   . Diabetes Father    Social History:  reports that he quit smoking about 9 years ago. He quit smokeless tobacco use about 9 years ago. He reports previous drug use. He reports that he does not drink alcohol.  Allergies: No Known Allergies  Medications:  I have reviewed the patient's current medications. Prior to Admission:  Prior to Admission medications   Medication Sig Start Date End Date Taking? Authorizing Provider  acetaminophen  (TYLENOL) 325 MG tablet Take 1,000 mg by mouth every 6 (six) hours as needed for mild pain.   Yes [provider]  amLODipine (NORVASC) 10 MG tablet Take 1 tablet (10 mg total) by mouth daily. Patient taking differently: Take 10 mg by mouth daily. '10mg'$  at night and '5mg'$  in the am- nephrology 07/23/18  Yes Stark Jock, Jude, MD  atorvastatin (LIPITOR) 80 MG tablet Take 1 tablet (80 mg total) by mouth every morning. 10/17/18  Yes Juline Patch, MD  carvedilol (COREG) 12.5 MG tablet Take 1 tablet (12.5 mg total) by mouth 2 (two) times daily with a meal. 10/17/18  Yes Juline Patch, MD  folic acid (FOLVITE) 1 MG tablet Take 1 tablet (1 mg total) by mouth daily. 05/06/18  Yes Vaughan Basta, MD  gabapentin (NEURONTIN) 300 MG capsule Take 300 mg by mouth daily.   Yes [provider]  glipiZIDE (GLUCOTROL) 5 MG tablet Take 1 tablet (5 mg total) by mouth 2 (two) times daily before a meal. 10/17/18  Yes Juline Patch, MD  irbesartan (AVAPRO) 300 MG tablet Take 300 mg by mouth at bedtime.  05/14/17  Yes [provider]  torsemide (DEMADEX) 100 MG tablet Take 100 mg by mouth daily.   Yes [provider]     Scheduled: .  stroke: mapping our early stages of recovery book   Does not apply Once  . aspirin  324 mg Per Tube Daily  . atorvastatin  80 mg Per Tube q morning - 10a  . B-complex with vitamin C  1  tablet Per Tube Daily  . chlorhexidine gluconate (MEDLINE KIT)  15 mL Mouth Rinse BID  . Chlorhexidine Gluconate Cloth  6 each Topical Q0600  . docusate  100 mg Per Tube BID  . feeding supplement (PRO-STAT SUGAR FREE 64)  60 mL Per Tube TID  . feeding supplement (VITAL HIGH PROTEIN)  1,000 mL Per Tube Q24H  . insulin aspart  0-9 Units Subcutaneous TID WC  . mouth rinse  15 mL Mouth Rinse 10 times per day  . pantoprazole sodium  40 mg Per Tube Daily  . polyethylene glycol  17 g Per Tube Daily  . sodium chloride flush  10-40 mL Intracatheter Q12H    ROS: History  obtained from the patient  General ROS: negative for - chills, fatigue, fever, night sweats, weight gain or weight loss Psychological ROS: negative for - behavioral disorder, hallucinations, memory difficulties, mood swings or suicidal ideation Ophthalmic ROS: negative for - blurry vision, double vision, eye pain or loss of vision ENT ROS: negative for - epistaxis, nasal discharge, oral lesions, sore throat, tinnitus or vertigo Allergy and Immunology ROS: negative for - hives or itchy/watery eyes Hematological and Lymphatic ROS: negative for - bleeding problems, bruising or swollen lymph nodes Endocrine ROS: negative for - galactorrhea, hair pattern changes, polydipsia/polyuria or temperature intolerance Respiratory ROS: shortness of breath Cardiovascular ROS: negative for - chest pain, dyspnea on exertion, edema or irregular heartbeat Gastrointestinal ROS: negative for - abdominal pain, diarrhea, hematemesis, nausea/vomiting or stool incontinence Genito-Urinary ROS: negative for - dysuria, hematuria, incontinence or urinary frequency/urgency Musculoskeletal ROS: weakness Neurological ROS: as noted in HPI Dermatological ROS: negative for rash and skin lesion changes  Physical Examination: Blood pressure (!) 158/57, pulse 81, temperature 100.1 F (37.8 C), temperature source Axillary, resp. rate 15, height 5' 7.99" (1.727 m), weight 123.7 kg, SpO2 98 %.  HEENT-  Normocephalic, no lesions, without obvious abnormality.  Normal external eye and conjunctiva.  Normal TM's bilaterally.  Normal auditory canals and external ears. Normal external nose, mucus membranes and septum.  Normal pharynx. Cardiovascular- S1, S2 normal, pulses palpable throughout   Abdomen- soft, non-tender; bowel sounds normal; no masses,  no organomegaly Extremities- LE edema Lymph-no adenopathy palpable Musculoskeletal-no joint tenderness, deformity or swelling Skin-warm and dry, no hyperpigmentation, vitiligo, or  suspicious lesions  Neurological Examination   Mental Status: Alert, oriented, thought content appropriate.  Speech minimal but fluent without evidence of aphasia.  Able to follow 3 step commands without difficulty. Cranial Nerves: II: Discs flat bilaterally; Visual fields grossly normal, pupils equal, round, reactive to light and accommodation III,IV, VI: ptosis not present, extra-ocular motions intact bilaterally V,VII: smile symmetric, facial light touch sensation normal bilaterally VIII: hearing normal bilaterally IX,X: gag reflex present XI: bilateral shoulder shrug XII: midline tongue extension Motor: Lifts all extremities against gravity but does not overcome resistance.   Sensory: Pinprick and light touch intact throughout, bilaterally Deep Tendon Reflexes: Symmetric throughout Plantars: Right: mute   Left: mute Cerebellar: Normal finger-to-nose testing bilaterally Gait: not tested due to safety concerns    Laboratory Studies:  Basic Metabolic Panel: Recent Labs  Lab 05/20/2019 0456 05/28/2019 1744 05/26/2019 1744 06/05/19 0455 06/05/19 1018 06/05/19 1703 06/05/19 2251 06/06/19 0446 06/06/19 1022  NA 143 136  --  137 137 134* 134* 135 133*  K 6.4* 5.5*  --  4.5 4.1 3.8 3.9 3.8 3.7  CL 101 95*  --  98 100 99 98 99 98  CO2 21* 21*  --  23 25  21* '25 26 23  '$ GLUCOSE 227* 113*  --  91 79 116* 161* 157* 194*  BUN 123* 100*  --  69* 55* 44* 40* 38* 36*  CREATININE 15.46* 11.70*  --  7.31* 6.30* 5.07* 4.17* 3.63* 3.51*  CALCIUM 7.2* 6.5*  --  7.7* 8.0* 8.0* 8.2* 8.4* 8.7*  MG  --   --    < > 2.1 1.9 1.9 2.0 2.0 1.9  PHOS 7.9* 8.1*  --  6.0* 5.0* 4.6  --   --   --    < > = values in this interval not displayed.    Liver Function Tests: Recent Labs  Lab 06/01/2019 0048 05/20/2019 1941 05/27/2019 1744 06/05/19 0455 06/05/19 1018 06/05/19 1703 06/06/19 1022  AST 27 109*  --   --   --   --  97*  ALT 28 71*  --   --   --   --  59*  ALKPHOS 190* 318*  --   --   --   --  278*   BILITOT 0.8 2.3*  --   --   --   --  3.2*  PROT 8.0 7.4  --   --   --   --  6.6  ALBUMIN 4.0 3.7 2.9* 2.8* 2.7* 2.6* 2.5*   No results for input(s): LIPASE, AMYLASE in the last 168 hours. No results for input(s): AMMONIA in the last 168 hours.  CBC: Recent Labs  Lab 06/08/2019 0048 05/17/2019 1941 06/03/19 1136 05/17/2019 0343 06/05/19 0449 06/06/19 0446  WBC 7.6 1.8* 19.6* 33.0* 31.3* 19.1*  NEUTROABS 4.9 1.3*  --   --   --   --   HGB 9.3* 8.6* 9.3* 8.6* 7.8* 7.5*  HCT 29.3* 26.8* 28.2* 27.3* 24.1* 24.2*  MCV 102.4* 101.5* 99.3 102.2* 103.0* 104.3*  PLT 118* 66* 47* 58* 83* 89*    Cardiac Enzymes: No results for input(s): CKTOTAL, CKMB, CKMBINDEX, TROPONINI in the last 168 hours.  BNP: Invalid input(s): POCBNP  CBG: Recent Labs  Lab 06/05/19 1910 06/05/19 2317 06/06/19 0317 06/06/19 0734 06/06/19 1125  GLUCAP 119* 137* 134* 165* 180*    Microbiology: Results for orders placed or performed during the hospital encounter of 05/18/2019  Respiratory Panel by RT PCR (Flu A&B, Covid) - Nasopharyngeal Swab     Status: None   Collection Time: 06/08/2019  2:35 AM   Specimen: Nasopharyngeal Swab  Result Value Ref Range Status   SARS Coronavirus 2 by RT PCR NEGATIVE NEGATIVE Final    Comment: (NOTE) SARS-CoV-2 target nucleic acids are NOT DETECTED. The SARS-CoV-2 RNA is generally detectable in upper respiratoy specimens during the acute phase of infection. The lowest concentration of SARS-CoV-2 viral copies this assay can detect is 131 copies/mL. A negative result does not preclude SARS-Cov-2 infection and should not be used as the sole basis for treatment or other patient management decisions. A negative result may occur with  improper specimen collection/handling, submission of specimen other than nasopharyngeal swab, presence of viral mutation(s) within the areas targeted by this assay, and inadequate number of viral copies (<131 copies/mL). A negative result must be  combined with clinical observations, patient history, and epidemiological information. The expected result is Negative. Fact Sheet for Patients:  PinkCheek.be Fact Sheet for Healthcare Providers:  GravelBags.it This test is not yet ap proved or cleared by the [place] FDA and  has been authorized for detection and/or diagnosis of SARS-CoV-2 by FDA under an Emergency Use Authorization (EUA). This EUA  will remain  in effect (meaning this test can be used) for the duration of the COVID-19 declaration under Section 564(b)(1) of the Act, 21 U.S.C. section 360bbb-3(b)(1), unless the authorization is terminated or revoked sooner.    Influenza A by PCR NEGATIVE NEGATIVE Final   Influenza B by PCR NEGATIVE NEGATIVE Final    Comment: (NOTE) The Xpert Xpress SARS-CoV-2/FLU/RSV assay is intended as an aid in  the diagnosis of influenza from Nasopharyngeal swab specimens and  should not be used as a sole basis for treatment. Nasal washings and  aspirates are unacceptable for Xpert Xpress SARS-CoV-2/FLU/RSV  testing. Fact Sheet for Patients: PinkCheek.be Fact Sheet for Healthcare Providers: GravelBags.it This test is not yet approved or cleared by the [place] FDA and  has been authorized for detection and/or diagnosis of SARS-CoV-2 by  FDA under an Emergency Use Authorization (EUA). This EUA will remain  in effect (meaning this test can be used) for the duration of the  Covid-19 declaration under Section 564(b)(1) of the Act, 21  U.S.C. section 360bbb-3(b)(1), unless the authorization is  terminated or revoked. Performed at [facility], [address] [place], [place] [address]   Culture, blood (routine x 2)     Status: None (Preliminary result)   Collection Time: 05/27/2019  2:36 AM   Specimen: BLOOD  Result Value Ref Range Status   Specimen Description  BLOOD LEFT ANTECUBITAL  Final   Special Requests   Final    BOTTLES DRAWN AEROBIC AND ANAEROBIC Blood Culture adequate volume   Culture   Final    NO GROWTH 4 DAYS Performed at [facility], [address] [place], [place] [address]    Report Status PENDING  Incomplete  Culture, blood (routine x 2)     Status: None (Preliminary result)   Collection Time: 06/07/2019  2:36 AM   Specimen: BLOOD  Result Value Ref Range Status   Specimen Description BLOOD RIGHT FOREARM  Final   Special Requests   Final    BOTTLES DRAWN AEROBIC AND ANAEROBIC Blood Culture adequate volume   Culture   Final    NO GROWTH 4 DAYS Performed at [facility], [address], [place], [place] [address]    Report Status PENDING  Incomplete  Culture, blood (routine x 2)     Status: None (Preliminary result)   Collection Time: 06/01/2019  3:10 PM   Specimen: BLOOD  Result Value Ref Range Status   Specimen Description BLOOD A-LINE  Final   Special Requests   Final    BOTTLES DRAWN AEROBIC AND ANAEROBIC Blood Culture results may not be optimal due to an excessive volume of blood received in culture bottles   Culture   Final    NO GROWTH 4 DAYS Performed at [facility], [address] [place], [place] [address]    Report Status PENDING  Incomplete  Culture, blood (routine x 2)     Status: None (Preliminary result)   Collection Time: 05/28/2019  3:10 PM   Specimen: BLOOD  Result Value Ref Range Status   Specimen Description BLOOD A-LINE  Final   Special Requests   Final    BOTTLES DRAWN AEROBIC AND ANAEROBIC Blood Culture results may not be optimal due to an excessive volume of blood received in culture bottles   Culture   Final    NO GROWTH 4 DAYS Performed at [facility], [address] [place], [place] [address]    Report Status PENDING  Incomplete  MRSA PCR Screening     Status: None   Collection Time: 06/03/19  5:45 PM   Specimen: Nasopharyngeal  Result Value Ref  Range Status   MRSA by PCR NEGATIVE NEGATIVE Final    Comment:        The GeneXpert MRSA Assay (FDA approved for NASAL specimens only), is one component of a comprehensive MRSA colonization surveillance program. It is not intended to diagnose MRSA infection nor to guide or monitor treatment for MRSA infections. Performed at [facility], [address], [place], [place] [address]   Urine Culture     Status: None   Collection Time: 06/03/19  6:20 PM   Specimen: Urine, Catheterized  Result Value Ref Range Status   Specimen Description   Final    URINE, CATHETERIZED Performed at [facility], [address], [place], [place] [address]    Special Requests   Final    Immunocompromised Performed at [facility], [address] [place], [place] [address]    Culture   Final    NO GROWTH Performed at [facility], [place] [address] [place], [place] [address]    Report Status 06/05/2019 FINAL  Final  Cath Tip Culture     Status: None (Preliminary result)   Collection Time: 05/16/2019  4:32 PM   Specimen: Catheter Tip; Other  Result Value Ref Range Status   Specimen Description   Final    CATH TIP Performed at [facility], [address] [place], [place] [address]    Special Requests   Final    NONE Performed at [facility], [address] [place], [place] [address]    Culture   Final    NO GROWTH < 12 HOURS Performed at [facility], [place] [address] [place], [place] [address]    Report Status PENDING  Incomplete    Coagulation Studies: Recent Labs    06/06/19 0740  LABPROT 13.2  INR 1.0    Urinalysis: No results for input(s): COLORURINE, LABSPEC, PHURINE, GLUCOSEU, HGBUR, BILIRUBINUR, KETONESUR, PROTEINUR, UROBILINOGEN, NITRITE, LEUKOCYTESUR in the last 168 hours.  Invalid input(s): APPERANCEUR  Lipid Panel:    Component Value Date/Time   CHOL 101 05/21/2019 0343   TRIG 715 (H) 05/16/2019 2210    HDL <10 (L) 05/18/2019 0343   CHOLHDL UNABLE TO CALCULATE DUE TO NON NUMERIC VALUE  06/13/2019 0343   VLDL UNABLE TO CALCULATE IF TRIGLYCERIDE OVER 400 mg/dL 05/24/2019 0343   LDLCALC UNABLE TO CALCULATE IF TRIGLYCERIDE OVER 400 mg/dL 05/28/2019 0343    HgbA1C:  Lab Results  Component Value Date   HGBA1C 6.4 (H) 05/25/2019    Urine Drug Screen:  No results found for: LABOPIA, COCAINSCRNUR, LABBENZ, AMPHETMU, THCU, LABBARB  Alcohol Level: No results for input(s): ETH in the last 168 hours.  Imaging: EEG  Result Date: 06/06/2019 Lora Havens, MD     06/06/2019  9:42 AM Patient Name: Jake Gomez MRN: [id number] Epilepsy Attending: Lora Havens Referring Physician/Provider: Dr. [name] Date: 06/05/2019 Duration: 22.30 minutes Patient history: 48 year old male who presented with an episode of syncope and seizure-like activity.  EEG to evaluate for seizures. Level of alertness: Comatose/sedated AEDs during EEG study: None Technical aspects: This EEG study was done with scalp electrodes positioned according to the 10-20 International system of electrode placement. Electrical activity was acquired at a sampling rate of '500Hz'$  and reviewed with a high frequency filter of '70Hz'$  and a low frequency  filter of '1Hz'$ . EEG data were recorded continuously and digitally stored. Description: EEG showed continuous generalized 3 to 6 Hz theta-delta slowing.  EEG was reactive to tactile stimulation.  Hyperventilation photic stimulation were not performed. Abnormality -Continuous slow, generalized IMPRESSION: This study is suggestive of moderate to severe diffuse encephalopathy, nonspecific etiology.  No seizures or epileptiform discharges were seen throughout the recording.   DG Abd 1 View  Result Date: 05/27/2019 CLINICAL DATA:  OG tube placement EXAM: ABDOMEN - 1 VIEW COMPARISON:  None. FINDINGS: OG tube tip is at the GE junction with the side port in the distal esophagus. Nonobstructive bowel gas  pattern. IMPRESSION: OG tube tip at the GE junction. This could be advanced several cm into the stomach. Electronically Signed   By: Rolm Baptise M.D.   On: 06/08/2019 12:19   PERIPHERAL VASCULAR CATHETERIZATION  Result Date: 05/26/2019 See op note  DG CHEST PORT 1 VIEW  Result Date: 05/29/2019 CLINICAL DATA:  OG tube placement.  Intubation. EXAM: PORTABLE CHEST 1 VIEW COMPARISON:  05/23/2019. FINDINGS: Endotracheal tube noted with its tip 4 cm above the carina. NG tube noted with tip below left hemidiaphragm. Dual-lumen right IJ catheter with tip over SVC. Mild infiltrates noted over the left lung and over the right lung base. No significant interim change. Small left pleural effusion again noted. No pneumothorax. IMPRESSION: 1. Endotracheal tube noted with tip 4 cm above the carina. NG tube noted with tip below left hemidiaphragm. Dual-lumen right IJ catheter with tip over SVC. 2. Mild infiltrates noted over the left lung and over the right lung base. No significant interim change. Small left pleural effusion again noted. Electronically Signed   By: Marcello Moores  Register   On: 05/29/2019 12:20    Assessment: 48 y.o. male with a history of DM, HTN and ESRD who overnight had an episode of syncope and seizure like activity.  Patient with multiple metabolic abnormalities/infection at presentation that may have provoked seizure like activity but MRI imaging performed as well and on review shows multiple acute, small infarcts bilaterally.  MRA unremarkable. Embolic etiology likely.  Can not rule out septic emboli.  Patient on no antiplatelet therapy prior to admission.   Carotid dopplers show no evidence of hemodynamically significant stenosis.  Echocardiogram pending.    Stroke Risk Factors - diabetes mellitus and hypertension   - Con't ASA and Plavix therapy - Echo reviewed no clear signs of thrombus - Could be covid related hypercoagulable state - EEG no seizure activity  - TEE to make sure this is  not endocarditis related even though blood cx are negative.

## 2019-06-07 ENCOUNTER — Inpatient Hospital Stay: Payer: Medicare Other

## 2019-06-07 LAB — CATH TIP CULTURE: Culture: NO GROWTH

## 2019-06-07 LAB — CBC
HCT: 25.2 % — ABNORMAL LOW (ref 39.0–52.0)
Hemoglobin: 8.3 g/dL — ABNORMAL LOW (ref 13.0–17.0)
MCH: 32.3 pg (ref 26.0–34.0)
MCHC: 32.9 g/dL (ref 30.0–36.0)
MCV: 98.1 fL (ref 80.0–100.0)
Platelets: 106 10*3/uL — ABNORMAL LOW (ref 150–400)
RBC: 2.57 MIL/uL — ABNORMAL LOW (ref 4.22–5.81)
RDW: 14.6 % (ref 11.5–15.5)
WBC: 11.7 10*3/uL — ABNORMAL HIGH (ref 4.0–10.5)
nRBC: 0.9 % — ABNORMAL HIGH (ref 0.0–0.2)

## 2019-06-07 LAB — BASIC METABOLIC PANEL
Anion gap: 14 (ref 5–15)
BUN: 32 mg/dL — ABNORMAL HIGH (ref 6–20)
CO2: 23 mmol/L (ref 22–32)
Calcium: 8.5 mg/dL — ABNORMAL LOW (ref 8.9–10.3)
Chloride: 99 mmol/L (ref 98–111)
Creatinine, Ser: 2.73 mg/dL — ABNORMAL HIGH (ref 0.61–1.24)
GFR calc Af Amer: 31 mL/min — ABNORMAL LOW (ref >60)
GFR calc non Af Amer: 27 mL/min — ABNORMAL LOW (ref >60)
Glucose, Bld: 188 mg/dL — ABNORMAL HIGH (ref 70–99)
Potassium: 3.5 mmol/L (ref 3.5–5.1)
Sodium: 136 mmol/L (ref 135–145)

## 2019-06-07 LAB — GLUCOSE, CAPILLARY
Glucose-Capillary: 173 mg/dL — ABNORMAL HIGH (ref 70–99)
Glucose-Capillary: 158 mg/dL — ABNORMAL HIGH (ref 70–99)
Glucose-Capillary: 197 mg/dL — ABNORMAL HIGH (ref 70–99)
Glucose-Capillary: 185 mg/dL — ABNORMAL HIGH (ref 70–99)
Glucose-Capillary: 162 mg/dL — ABNORMAL HIGH (ref 70–99)

## 2019-06-07 LAB — CULTURE, BLOOD (ROUTINE X 2)
Culture: NO GROWTH
Culture: NO GROWTH
Culture: NO GROWTH
Culture: NO GROWTH
Special Requests: ADEQUATE
Special Requests: ADEQUATE

## 2019-06-07 LAB — PTT FACTOR INHIBITOR (MIXING STUDY): aPTT: 26.2 s (ref 22.9–30.2)

## 2019-06-07 LAB — MAGNESIUM: Magnesium: 2.1 mg/dL (ref 1.7–2.4)

## 2019-06-07 MED ORDER — EPOETIN ALFA 10000 UNIT/ML IJ SOLN
10000.0000 [IU] | INTRAMUSCULAR | Status: DC
  Administered 2019-06-09: 10000 [IU] via INTRAVENOUS
  Filled 2019-06-07: qty 1

## 2019-06-07 MED ORDER — SODIUM CHLORIDE 0.9 % IV SOLN
3.0000 g | INTRAVENOUS | Status: DC
  Administered 2019-06-08: 3 g via INTRAVENOUS
  Filled 2019-06-07: qty 8
  Filled 2019-06-07: qty 3

## 2019-06-07 MED ORDER — PUREFLOW DIALYSIS SOLUTION: INTRAVENOUS | Status: DC

## 2019-06-07 MED ORDER — SENNOSIDES-DOCUSATE SODIUM 8.6-50 MG PO TABS: 1.0000 | ORAL_TABLET | Freq: Two times a day (BID) | ORAL | Status: DC

## 2019-06-07 NOTE — Progress Notes (Signed)
CRITICAL CARE NOTE 48 yo Hispanic male with acute and severe hypoxic  resp failure from acute pneumonia with previous COVID-19 infection with acute CVA based on MRI    CC  follow up respiratory failure  SUBJECTIVE Patient remains critically ill Prognosis is guarded CRRT stopped this morning Remains on ventilator Prognosis is poor Post Covid pneumonitis and CVA  BP (!) 129/56   Pulse 83   Temp 98.6 F (37 C) (Axillary)   Resp 14   Ht 5' 7.99" (1.727 m)   Wt 122.7 kg   SpO2 100%   BMI 41.14 kg/m    I/O last 3 completed shifts: In: 3560.2 [I.V.:1197; Other:15; NG/GT:1740; IV Piggyback:608.2] Out: 3335 [Other:3335] No intake/output data recorded.  SpO2: 100 % O2 Flow Rate (L/min): 3 L/min FiO2 (%): 30 %   SIGNIFICANT EVENTS 12/21 Intubated, resp failure, acute CVA Perm cath removed, vasc cath LEFT groin, Triple lumen RT groin placed 12/22 on CRRT, septic shock, severe hypoxia, on vent, unable to wean due to hemodynamic instability 12/23 attemt neuro assessment today, follow up NEURO recs 12/24 remains on vent, try HD today, wean off CRRT  REVIEW OF SYSTEMS  PATIENT IS UNABLE TO PROVIDE COMPLETE REVIEW OF SYSTEMS DUE TO SEVERE CRITICAL ILLNESS   PHYSICAL EXAMINATION:  GENERAL:critically ill appearing, +resp distress HEAD: Normocephalic, atraumatic.  EYES: Pupils equal, round, reactive to light.  No scleral icterus.  MOUTH: Moist mucosal membrane. NECK: Supple.  PULMONARY: +rhonchi, +wheezing CARDIOVASCULAR: S1 and S2. Regular rate and rhythm. No murmurs, rubs, or gallops.  GASTROINTESTINAL: Soft, nontender, -distended. No masses. Positive bowel sounds. No hepatosplenomegaly.  MUSCULOSKELETAL: No swelling, clubbing, or edema.  NEUROLOGIC: obtunded, GCS<8 SKIN:intact,warm,dry  MEDICATIONS: I have reviewed all medications and confirmed regimen as documented   CULTURE RESULTS   Recent Results (from the past 240 hour(s))  Respiratory Panel by RT PCR (Flu  A&B, Covid) - Nasopharyngeal Swab     Status: None   Collection Time: 06/13/2019  2:35 AM   Specimen: Nasopharyngeal Swab  Result Value Ref Range Status   SARS Coronavirus 2 by RT PCR NEGATIVE NEGATIVE Final    Comment: (NOTE) SARS-CoV-2 target nucleic acids are NOT DETECTED. The SARS-CoV-2 RNA is generally detectable in upper respiratoy specimens during the acute phase of infection. The lowest concentration of SARS-CoV-2 viral copies this assay can detect is 131 copies/mL. A negative result does not preclude SARS-Cov-2 infection and should not be used as the sole basis for treatment or other patient management decisions. A negative result may occur with  improper specimen collection/handling, submission of specimen other than nasopharyngeal swab, presence of viral mutation(s) within the areas targeted by this assay, and inadequate number of viral copies (<131 copies/mL). A negative result must be combined with clinical observations, patient history, and epidemiological information. The expected result is Negative. Fact Sheet for Patients:  PinkCheek.be Fact Sheet for Healthcare Providers:  GravelBags.it This test is not yet ap proved or cleared by the [place] FDA and  has been authorized for detection and/or diagnosis of SARS-CoV-2 by FDA under an Emergency Use Authorization (EUA). This EUA will remain  in effect (meaning this test can be used) for the duration of the COVID-19 declaration under Section 564(b)(1) of the Act, 21 U.S.C. section 360bbb-3(b)(1), unless the authorization is terminated or revoked sooner.    Influenza A by PCR NEGATIVE NEGATIVE Final   Influenza B by PCR NEGATIVE NEGATIVE Final    Comment: (NOTE) The Xpert Xpress SARS-CoV-2/FLU/RSV assay is intended as an aid  in  the diagnosis of influenza from Nasopharyngeal swab specimens and  should not be used as a sole basis for treatment. Nasal washings  and  aspirates are unacceptable for Xpert Xpress SARS-CoV-2/FLU/RSV  testing. Fact Sheet for Patients: PinkCheek.be Fact Sheet for Healthcare Providers: GravelBags.it This test is not yet approved or cleared by the [place] FDA and  has been authorized for detection and/or diagnosis of SARS-CoV-2 by  FDA under an Emergency Use Authorization (EUA). This EUA will remain  in effect (meaning this test can be used) for the duration of the  Covid-19 declaration under Section 564(b)(1) of the Act, 21  U.S.C. section 360bbb-3(b)(1), unless the authorization is  terminated or revoked. Performed at [facility], [address] [place], [place] [address]   Culture, blood (routine x 2)     Status: None (Preliminary result)   Collection Time: 06/09/2019  2:36 AM   Specimen: BLOOD  Result Value Ref Range Status   Specimen Description BLOOD LEFT ANTECUBITAL  Final   Special Requests   Final    BOTTLES DRAWN AEROBIC AND ANAEROBIC Blood Culture adequate volume   Culture   Final    NO GROWTH 4 DAYS Performed at [facility], [address] [place], [place] [address]    Report Status PENDING  Incomplete  Culture, blood (routine x 2)     Status: None (Preliminary result)   Collection Time: 05/25/2019  2:36 AM   Specimen: BLOOD  Result Value Ref Range Status   Specimen Description BLOOD RIGHT FOREARM  Final   Special Requests   Final    BOTTLES DRAWN AEROBIC AND ANAEROBIC Blood Culture adequate volume   Culture   Final    NO GROWTH 4 DAYS Performed at [facility], [address] [place], [place] [address]    Report Status PENDING  Incomplete  Culture, blood (routine x 2)     Status: None (Preliminary result)   Collection Time: 06/13/2019  3:10 PM   Specimen: BLOOD  Result Value Ref Range Status   Specimen Description BLOOD A-LINE  Final   Special Requests   Final    BOTTLES DRAWN AEROBIC AND ANAEROBIC  Blood Culture results may not be optimal due to an excessive volume of blood received in culture bottles   Culture   Final    NO GROWTH 4 DAYS Performed at [facility], [address], [place], [place] [address]    Report Status PENDING  Incomplete  Culture, blood (routine x 2)     Status: None (Preliminary result)   Collection Time: 05/22/2019  3:10 PM   Specimen: BLOOD  Result Value Ref Range Status   Specimen Description BLOOD A-LINE  Final   Special Requests   Final    BOTTLES DRAWN AEROBIC AND ANAEROBIC Blood Culture results may not be optimal due to an excessive volume of blood received in culture bottles   Culture   Final    NO GROWTH 4 DAYS Performed at [facility], [address] [place], [place] [address]    Report Status PENDING  Incomplete  MRSA PCR Screening     Status: None   Collection Time: 06/03/19  5:45 PM   Specimen: Nasopharyngeal  Result Value Ref Range Status   MRSA by PCR NEGATIVE NEGATIVE Final    Comment:        The GeneXpert MRSA Assay (FDA approved for NASAL specimens only), is one component of a comprehensive MRSA colonization surveillance program. It  is not intended to diagnose MRSA infection nor to guide or monitor treatment for MRSA infections. Performed at [facility], [address] [place], [place] [address]   Urine Culture     Status: None   Collection Time: 06/03/19  6:20 PM   Specimen: Urine, Catheterized  Result Value Ref Range Status   Specimen Description   Final    URINE, CATHETERIZED Performed at [facility], [address] [place], [place] [address]    Special Requests   Final    Immunocompromised Performed at [facility], [address] [place], [place] [address]    Culture   Final    NO GROWTH Performed at [facility], [place] [address], [place], [place] [address]    Report Status 06/05/2019 FINAL  Final  Cath Tip Culture     Status: None (Preliminary result)    Collection Time: 05/25/2019  4:32 PM   Specimen: Catheter Tip; Other  Result Value Ref Range Status   Specimen Description   Final    CATH TIP Performed at [facility], [address], [place], [place] [address]    Special Requests   Final    NONE Performed at [facility], [address], [place], [place] [address]    Culture   Final    NO GROWTH 2 DAYS Performed at [facility], [place] [address] [place], [place] [address]    Report Status PENDING  Incomplete  Culture, respiratory (non-expectorated)     Status: None (Preliminary result)   Collection Time: 06/06/19 10:22 AM   Specimen: Tracheal Aspirate; Respiratory  Result Value Ref Range Status   Specimen Description   Final    TRACHEAL ASPIRATE Performed at [facility], [address], [place], [place] [address]    Special Requests   Final    NONE Performed at [facility], [address] [place], [place] [address]    Gram Stain   Final    RARE WBC PRESENT, PREDOMINANTLY PMN NO ORGANISMS SEEN Performed at [facility], [place] [address], [place], [place] [address]    Culture PENDING  Incomplete   Report Status PENDING  Incomplete          IMAGING    EEG  Result Date: 06/06/2019 Lora Havens, MD     06/06/2019  9:42 AM Patient Name: Jake Gomez MRN: [id number] Epilepsy Attending: Lora Havens Referring Physician/Provider: Dr. [name] Date: 06/05/2019 Duration: 22.30 minutes Patient history: 48 year old male who presented with an episode of syncope and seizure-like activity.  EEG to evaluate for seizures. Level of alertness: Comatose/sedated AEDs during EEG study: None Technical aspects: This EEG study was done with scalp electrodes positioned according to the 10-20 International system of electrode placement. Electrical activity was acquired at a sampling rate of '500Hz'$  and reviewed with a high frequency filter of '70Hz'$  and a low frequency filter of  '1Hz'$ . EEG data were recorded continuously and digitally stored. Description: EEG showed continuous generalized 3 to 6 Hz theta-delta slowing.  EEG was reactive to tactile stimulation.  Hyperventilation photic stimulation were not performed. Abnormality -Continuous slow, generalized IMPRESSION: This study is suggestive of moderate to severe diffuse encephalopathy, nonspecific etiology.  No seizures or epileptiform discharges were seen throughout the recording.   ECHOCARDIOGRAM COMPLETE  Result Date: 06/06/2019   ECHOCARDIOGRAM REPORT   Patient Name:   NIKOLAUS PIENTA Date of Exam: 06/06/2019 Medical Rec #:  [id number]  Height:       68.0 in Accession #:    [id number]   Weight:       272.7 lb Date of Birth:  [birth year]    BSA:          2.33 m Patient Age:    89 years     BP:           124/49 mmHg Patient Gender: M            HR:           84 bpm. Exam Location:  [facility] Procedure: 2D Echo, Color Doppler, Cardiac Doppler and Intracardiac            Opacification Agent Indications:     R06.89 Acute Respiratory Insufficiency  History:         Patient has prior history of Echocardiogram examinations, most                  recent 06/03/2019. Renal disease; Risk Factors:Hypertension and                  Diabetes.  Sonographer:     Charmayne Sheer RDCS (AE) Referring Phys:  [id number] Flora Lipps Diagnosing Phys: Ida Rogue MD  Sonographer Comments: Echo performed with patient supine and on artificial respirator and Technically difficult study due to poor echo windows. Image acquisition challenging due to patient body habitus. IMPRESSIONS  1. Left ventricular ejection fraction, by visual estimation, is 60 to 65%. The left ventricle has normal function. There is no left ventricular hypertrophy.  2. Left ventricular diastolic parameters are consistent with Grade I diastolic dysfunction (impaired relaxation).  3. The left ventricle has no regional wall motion abnormalities.  4. Global right ventricle has normal systolic function.The  right ventricular size is normal. No increase in right ventricular wall thickness.  5. Left atrial size was moderately dilated.  6. TR signal is inadequate for assessing pulmonary artery systolic pressure.  7. Definity contrast agent was given IV to delineate the left ventricular endocardial borders. FINDINGS  Left Ventricle: Left ventricular ejection fraction, by visual estimation, is 60 to 65%. The left ventricle has normal function. Definity contrast agent was given IV to delineate the left ventricular endocardial borders. The left ventricle has no regional wall motion abnormalities. There is no left ventricular hypertrophy. Left ventricular diastolic parameters are consistent with Grade I diastolic dysfunction (impaired relaxation). Normal left atrial pressure. Right Ventricle: The right ventricular size is normal. No increase in right ventricular wall thickness. Global RV systolic function is has normal systolic function. Left Atrium: Left atrial size was moderately dilated. Right Atrium: Right atrial size was normal in size Pericardium: There is no evidence of pericardial effusion. Mitral Valve: The mitral valve is normal in structure. Moderate mitral annular calcification. Mild mitral valve regurgitation. No evidence of mitral valve stenosis by observation. MV peak gradient, 10.1 mmHg. Tricuspid Valve: The tricuspid valve is normal in structure. Tricuspid valve regurgitation is mild. Aortic Valve: The aortic valve is normal in structure. Aortic valve regurgitation is not visualized. The aortic valve is structurally normal, with no evidence of sclerosis or stenosis. Aortic valve mean gradient measures 8.0 mmHg. Aortic valve peak gradient measures 14.0 mmHg. Aortic valve area, by VTI measures 2.51 cm. Pulmonic Valve: The pulmonic valve was normal in structure. Pulmonic valve regurgitation is not visualized. Pulmonic regurgitation is not visualized. Aorta: The aortic root, ascending aorta and aortic arch are  all structurally normal, with no evidence of dilitation or  obstruction. Venous: The inferior vena cava is normal in size with greater than 50% respiratory variability, suggesting right atrial pressure of 3 mmHg. IAS/Shunts: No atrial level shunt detected by color flow Doppler. There is no evidence of a patent foramen ovale. No ventricular septal defect is seen or detected. There is no evidence of an atrial septal defect.  LEFT VENTRICLE PLAX 2D LVIDd:         4.19 cm  Diastology LVIDs:         2.55 cm  LV e' lateral:   6.09 cm/s LV PW:         1.07 cm  LV E/e' lateral: 19.9 LV IVS:        1.12 cm  LV e' medial:    7.29 cm/s LVOT diam:     1.90 cm  LV E/e' medial:  16.6 LV SV:         55 ml LV SV Index:   21.92 LVOT Area:     2.84 cm  LEFT ATRIUM              Index LA diam:        5.10 cm  2.19 cm/m LA Vol (A2C):   79.5 ml  34.10 ml/m LA Vol (A4C):   105.0 ml 45.03 ml/m LA Biplane Vol: 90.3 ml  38.73 ml/m  AORTIC VALVE                    PULMONIC VALVE AV Area (Vmax):    2.32 cm     PV Vmax:       1.36 m/s AV Area (Vmean):   2.62 cm     PV Vmean:      89.500 cm/s AV Area (VTI):     2.51 cm     PV VTI:        0.248 m AV Vmax:           187.00 cm/s  PV Peak grad:  7.4 mmHg AV Vmean:          130.000 cm/s PV Mean grad:  4.0 mmHg AV VTI:            0.342 m AV Peak Grad:      14.0 mmHg AV Mean Grad:      8.0 mmHg LVOT Vmax:         153.00 cm/s LVOT Vmean:        120.000 cm/s LVOT VTI:          0.303 m LVOT/AV VTI ratio: 0.89  AORTA Ao Root diam: 3.00 cm MITRAL VALVE MV Area (PHT): 3.03 cm              SHUNTS MV Peak grad:  10.1 mmHg             Systemic VTI:  0.30 m MV Mean grad:  6.0 mmHg              Systemic Diam: 1.90 cm MV Vmax:       1.59 m/s MV Vmean:      115.0 cm/s MV VTI:        0.47 m MV PHT:        72.50 msec MV Decel Time: 250 msec MV E velocity: 121.00 cm/s 103 cm/s MV A velocity: 133.00 cm/s 70.3 cm/s MV E/A ratio:  0.91        1.5  Ida Rogue MD Electronically signed by Ida Rogue MD  Signature Date/Time: 06/06/2019/5:52:43 PM  Final        Indwelling Urinary Catheter continued, requirement due to   Reason to continue Indwelling Urinary Catheter strict Intake/Output monitoring for hemodynamic instability   Central Line/ continued, requirement due to  Reason to continue [place] of central venous pressure or other hemodynamic parameters and poor IV access   Ventilator continued, requirement due to severe respiratory failure   Ventilator Sedation RASS 0 to -2      ASSESSMENT AND PLAN SYNOPSIS  Severe ACUTE Hypoxic and Hypercapnic Respiratory Failurefrom pneumonia and acute aspirtaion pneumonia with inability to protect airway due to CVA, septic shock with end-stage renal disease on dialysis with morbid obesity Post Covid pneumonitis and pneumonia with hypercoagulable state with acute CVA    Severe ACUTE Hypoxic and Hypercapnic Respiratory Failure -continue Full MV support -continue Bronchodilator Therapy -Wean Fio2 and PEEP as tolerated -will perform SAT/SBT when respiratory parameters are met    KIDNEY INJURY/Renal Failure -follow chem 7 -follow UO -continue Foley Catheter-assess need -Avoid nephrotoxic agents -Recheck creatinine  Wean off CRRT Try HD   NEUROLOGY - intubated and sedated - minimal sedation to achieve a RASS goal: -1 Wake up assessment pending Acute CVA-consistent with embolic phenomenon Follow-up neurology recs  SHOCK-SEPSIS/HYPOVOLUMIC/CARDIOGENIC -use vasopressors to keep MAP>65 as needed Empiric antibiotics for aspiration pneumonia  CARDIAC ICU monitoring Cardiology consulted for TEE-assess for vegetations   ID -continue IV abx as prescibed -follow up cultures  GI GI PROPHYLAXIS as indicated  NUTRITIONAL STATUS DIET-->TF's as tolerated Constipation protocol as indicated  ENDO - will use ICU hypoglycemic\Hyperglycemia protocol if indicated   ELECTROLYTES -follow labs as needed -replace  as needed -pharmacy consultation and following   DVT/GI PRX ordered TRANSFUSIONS AS NEEDED MONITOR FSBS ASSESS the need for LABS as needed   Critical Care Time devoted to patient care services described in this note is 37 minutes.   Overall, patient is critically ill, prognosis is guarded.  Patient with Multiorgan failure and at high risk for cardiac arrest and death.   I anticipate prolonged ICU length of stay and ventilator support With multiple strokes patient may end up with tracheostomy and feeding tube Patient with post Covid pneumonitis pneumonia along with hypercoagulable state with acute bilateral infarcts  Corrin Parker, M.D.  [name]  Medical Director [place] Director [facility] Cardio-Pulmonary Department

## 2019-06-07 NOTE — Progress Notes (Signed)
CRRT discontinued this morning per Dr. Juleen China.   Cameron Ali, RN

## 2019-06-07 NOTE — Progress Notes (Addendum)
Shift summary:  - Remains intubated, sedated. Off CRRT. - Plan for HD today.  - TFs held pending TEE. Consent obtained from Daughter for TEE.

## 2019-06-07 NOTE — Plan of Care (Signed)

## 2019-06-07 NOTE — Progress Notes (Signed)
The BMP drawn for this patient at 2300 on 06/06/2019 showed a potassium of 3.4. The patient is currently on CRRT on a 2K bath. Messaged Dr. Juleen China regarding potassium level. Dr. Juleen China gave this nurse verbal orders to switch the patient to a 4K bath. Patient is currently stable, resting comfortably, and vital signs are within normal limits.Will continue to monitor.  Cameron Ali, RN

## 2019-06-07 NOTE — Progress Notes (Signed)
Pharmacy Antibiotic Note  Jake Gomez is a 48 y.o. male admitted on 05/15/2019 with hyperkalemia after missing multiple dialysis sessions. Patient transitioned off CRRT on 12/24 with next dialysis session scheduled for 12/24. Patient with acute CVA; TEE planned for 12/28. Pharmacy has been consulted for Unasyn dosing for probable aspiration PNA.   Plan: Will transition patient to Unasyn 3g IV Q24hr for remainder of therapy. Last dose will be 12/25.   Height: 5' 7.99" (172.7 cm) Weight: 270 lb 8.1 oz (122.7 kg) IBW/kg (Calculated) : 68.38  Temp (24hrs), Avg:99 F (37.2 C), Min:98.4 F (36.9 C), Max:99.4 F (37.4 C)  Recent Labs  Lab 06/10/2019 0235 06/09/2019 0456 06/03/19 1136 06/03/19 1820 05/15/2019 0343 06/05/19 0449 06/06/19 0446 06/06/19 0727 06/06/19 1022 06/06/19 1649 06/06/19 2246 06/07/19 0446  WBC  --  6.4 19.6*  --  33.0* 31.3* 19.1*  --   --   --   --  11.7*  CREATININE  --  15.46* 10.34*  --  12.14*  --  3.63*  --  3.51* 3.14* 2.91* 2.73*  LATICACIDVEN 1.0 1.1  --   --   --   --   --  1.1  --   --   --   --   VANCORANDOM  --   --   --  5  --   --   --   --   --   --   --   --     Estimated Creatinine Clearance: 43.1 mL/min (A) (by C-G formula based on SCr of 2.73 mg/dL (H)).    No Known Allergies  Antimicrobials this admission: Zosyn 12/19 >> 12/23 Vancomycin 12/19  >> 12/22 Unasyn 12/23 >> 12/25   Dose adjustments this admission: 12/22 Zosyn and Vancomycin transitioned to CRRT dosing  12/24 Unasyn transitioned to HD dosing   Microbiology results: 12/19 Influenza A & B: negative  12/19 COVID 19: negative  12/20 MRSA PCR: negative  12/20 Urine Culture: no growth  12/19 Blood Culture: no growth x 5 days 12/21 Cath Tip: no growth x 2 days  12/23 Tracheal Aspirate: culture reincubated for better growth.   Thank you for allowing pharmacy to be a part of this patient's care.  Tywan Siever L 06/07/2019 12:24 PM

## 2019-06-07 NOTE — Progress Notes (Signed)
[facility]  ROUNDING NOTE   Subjective:   Taken off CRRT this morning.  Tmax 100.3  Objective:  Vital signs in last 24 hours:  Temp:  [98.4 F (36.9 C)-100.1 F (37.8 C)] 98.6 F (37 C) (12/24 0715) Pulse Rate:  [73-92] 79 (12/24 0900) Resp:  [6-23] 15 (12/24 0900) BP: (115-181)/(53-72) 137/60 (12/24 0900) SpO2:  [97 %-100 %] 100 % (12/24 0900) FiO2 (%):  [30 %] 30 % (12/24 0815) Weight:  [122.7 kg] 122.7 kg (12/24 0248)  Weight change: -1 kg Filed Weights   06/05/19 0448 06/06/19 0424 06/07/19 0248  Weight: 121.9 kg 123.7 kg 122.7 kg    Intake/Output: I/O last 3 completed shifts: In: 3120.2 [I.V.:1197; Other:15; NG/GT:1300; IV Piggyback:608.2] Out: 3335 [Other:3335]   Intake/Output this shift:  Total I/O In: 850 [I.V.:80; NG/GT:770] Out: -   Physical Exam: General: Critically ill   Head: ETT   Eyes: Anicteric, PERRL  Neck:  trachea midline  Lungs:  PRVC FiO2 30%  Heart: regular  Abdomen:  Soft, nontender, obese  Extremities: ++ peripheral edema.  Neurologic: Intubated and sedated  Skin: No lesions  Access: Left femoral temp HD catheter 12/21 Dr. Lucky Cowboy Left Arm AVF Dr. Lucky Cowboy 0/97/35    Basic Metabolic Panel: Recent Labs  Lab 05/25/2019 0456 06/13/2019 1744 06/11/2019 1744 06/05/19 0455 06/05/19 1018 06/05/19 1703 06/06/19 0446 06/06/19 1022 06/06/19 1649 06/06/19 2246 06/07/19 0446  NA 143 136  --  137 137 134* 135 133* 133* 134* 136  K 6.4* 5.5*  --  4.5 4.1 3.8 3.8 3.7 3.5 3.4* 3.5  CL 101 95*  --  98 100 99 99 98 98 99 99  CO2 21* 21*  --  23 25 21* _0 GLUCOSE 227* 113*  --  91 79 116* 157* 194* 198* 185* 188*  BUN 123* 100*  --  69* 55* 44* 38* 36* 33* 32* 32*  CREATININE 15.46* 11.70*  --  7.31* 6.30* 5.07* 3.63* 3.51* 3.14* 2.91* 2.73*  CALCIUM 7.2* 6.5*  --  7.7* 8.0* 8.0* 8.4* 8.7* 8.6* 8.6* 8.5*  MG  --   --    < > 2.1 1.9 1.9 2.0 1.9 2.0 1.9 2.1  PHOS 7.9* 8.1*  --  6.0* 5.0* 4.6  --   --   --   --   --    < > =  values in this interval not displayed.    Liver Function Tests: Recent Labs  Lab 05/28/2019 0048 06/14/2019 1941 06/07/2019 1744 06/05/19 0455 06/05/19 1018 06/05/19 1703 06/06/19 1022  AST 27 109*  --   --   --   --  97*  ALT 28 71*  --   --   --   --  59*  ALKPHOS 190* 318*  --   --   --   --  278*  BILITOT 0.8 2.3*  --   --   --   --  3.2*  PROT 8.0 7.4  --   --   --   --  6.6  ALBUMIN 4.0 3.7 2.9* 2.8* 2.7* 2.6* 2.5*   No results for input(s): LIPASE, AMYLASE in the last 168 hours. No results for input(s): AMMONIA in the last 168 hours.  CBC: Recent Labs  Lab 06/14/2019 0048 06/01/2019 1941 06/03/19 1136 05/27/2019 0343 06/05/19 0449 06/06/19 0446 06/07/19 0446  WBC 7.6 1.8* 19.6* 33.0* 31.3* 19.1* 11.7*  NEUTROABS 4.9 1.3*  --   --   --   --   --  HGB 9.3* 8.6* 9.3* 8.6* 7.8* 7.5* 8.3*  HCT 29.3* 26.8* 28.2* 27.3* 24.1* 24.2* 25.2*  MCV 102.4* 101.5* 99.3 102.2* 103.0* 104.3* 98.1  PLT 118* 66* 47* 58* 83* 89* 106*    Cardiac Enzymes: No results for input(s): CKTOTAL, CKMB, CKMBINDEX, TROPONINI in the last 168 hours.  BNP: Invalid input(s): POCBNP  CBG: Recent Labs  Lab 06/06/19 1908 06/06/19 2020 06/06/19 2313 06/07/19 0322 06/07/19 0747  GLUCAP 161* 155* 181* 173* 158*    Microbiology: Results for orders placed or performed during the hospital encounter of 05/23/2019  Respiratory Panel by RT PCR (Flu A&B, Covid) - Nasopharyngeal Swab     Status: None   Collection Time: 06/11/2019  2:35 AM   Specimen: Nasopharyngeal Swab  Result Value Ref Range Status   SARS Coronavirus 2 by RT PCR NEGATIVE NEGATIVE Final    Comment: (NOTE) SARS-CoV-2 target nucleic acids are NOT DETECTED. The SARS-CoV-2 RNA is generally detectable in upper respiratoy specimens during the acute phase of infection. The lowest concentration of SARS-CoV-2 viral copies this assay can detect is 131 copies/mL. A negative result does not preclude SARS-Cov-2 infection and should not be used as  the sole basis for treatment or other patient management decisions. A negative result may occur with  improper specimen collection/handling, submission of specimen other than nasopharyngeal swab, presence of viral mutation(s) within the areas targeted by this assay, and inadequate number of viral copies (<131 copies/mL). A negative result must be combined with clinical observations, patient history, and epidemiological information. The expected result is Negative. Fact Sheet for Patients:  PinkCheek.be Fact Sheet for Healthcare Providers:  GravelBags.it This test is not yet ap proved or cleared by the [place] FDA and  has been authorized for detection and/or diagnosis of SARS-CoV-2 by FDA under an Emergency Use Authorization (EUA). This EUA will remain  in effect (meaning this test can be used) for the duration of the COVID-19 declaration under Section 564(b)(1) of the Act, 21 U.S.C. section 360bbb-3(b)(1), unless the authorization is terminated or revoked sooner.    Influenza A by PCR NEGATIVE NEGATIVE Final   Influenza B by PCR NEGATIVE NEGATIVE Final    Comment: (NOTE) The Xpert Xpress SARS-CoV-2/FLU/RSV assay is intended as an aid in  the diagnosis of influenza from Nasopharyngeal swab specimens and  should not be used as a sole basis for treatment. Nasal washings and  aspirates are unacceptable for Xpert Xpress SARS-CoV-2/FLU/RSV  testing. Fact Sheet for Patients: PinkCheek.be Fact Sheet for Healthcare Providers: GravelBags.it This test is not yet approved or cleared by the [place] FDA and  has been authorized for detection and/or diagnosis of SARS-CoV-2 by  FDA under an Emergency Use Authorization (EUA). This EUA will remain  in effect (meaning this test can be used) for the duration of the  Covid-19 declaration under Section 564(b)(1) of the Act, 21   U.S.C. section 360bbb-3(b)(1), unless the authorization is  terminated or revoked. Performed at [facility], [address] [place], [place] [address]   Culture, blood (routine x 2)     Status: None   Collection Time: 06/07/2019  2:36 AM   Specimen: BLOOD  Result Value Ref Range Status   Specimen Description BLOOD LEFT ANTECUBITAL  Final   Special Requests   Final    BOTTLES DRAWN AEROBIC AND ANAEROBIC Blood Culture adequate volume   Culture   Final    NO GROWTH 5 DAYS Performed at [facility], [place], [place]  [address]    Report Status 06/07/2019 FINAL  Final  Culture, blood (routine x 2)     Status: None   Collection Time: 06/08/2019  2:36 AM   Specimen: BLOOD  Result Value Ref Range Status   Specimen Description BLOOD RIGHT FOREARM  Final   Special Requests   Final    BOTTLES DRAWN AEROBIC AND ANAEROBIC Blood Culture adequate volume   Culture   Final    NO GROWTH 5 DAYS Performed at [facility], [address] [place], [place] [address]    Report Status 06/07/2019 FINAL  Final  Culture, blood (routine x 2)     Status: None   Collection Time: 06/10/2019  3:10 PM   Specimen: BLOOD  Result Value Ref Range Status   Specimen Description BLOOD A-LINE  Final   Special Requests   Final    BOTTLES DRAWN AEROBIC AND ANAEROBIC Blood Culture results may not be optimal due to an excessive volume of blood received in culture bottles   Culture   Final    NO GROWTH 5 DAYS Performed at [facility], [address] [place], [place] [address]    Report Status 06/07/2019 FINAL  Final  Culture, blood (routine x 2)     Status: None   Collection Time: 05/16/2019  3:10 PM   Specimen: BLOOD  Result Value Ref Range Status   Specimen Description BLOOD A-LINE  Final   Special Requests   Final    BOTTLES DRAWN AEROBIC AND ANAEROBIC Blood Culture results may not be optimal due to an excessive volume of blood received in culture bottles    Culture   Final    NO GROWTH 5 DAYS Performed at [facility], [address] [place], [place] [address]    Report Status 06/07/2019 FINAL  Final  MRSA PCR Screening     Status: None   Collection Time: 06/03/19  5:45 PM   Specimen: Nasopharyngeal  Result Value Ref Range Status   MRSA by PCR NEGATIVE NEGATIVE Final    Comment:        The GeneXpert MRSA Assay (FDA approved for NASAL specimens only), is one component of a comprehensive MRSA colonization surveillance program. It is not intended to diagnose MRSA infection nor to guide or monitor treatment for MRSA infections. Performed at [facility], [address], [place], [place] [address]   Urine Culture     Status: None   Collection Time: 06/03/19  6:20 PM   Specimen: Urine, Catheterized  Result Value Ref Range Status   Specimen Description   Final    URINE, CATHETERIZED Performed at [facility], [address], [place], [place] [address]    Special Requests   Final    Immunocompromised Performed at [facility], [address], [place], [place] [address]    Culture   Final    NO GROWTH Performed at [facility], [place] [address], [place], [place] [address]    Report Status 06/05/2019 FINAL  Final  Cath Tip Culture     Status: None   Collection Time: 05/19/2019  4:32 PM   Specimen: Catheter Tip; Other  Result Value Ref Range Status   Specimen Description   Final    CATH TIP Performed at [facility], [address], [place], [place] [address]    Special Requests   Final    NONE Performed at [facility], [address] [place],  [address]  Culture   Final    NO GROWTH 2 DAYS Performed at [facility], [place] [address] [place], [place] [address]    Report Status 06/07/2019 FINAL  Final  Culture, respiratory (non-expectorated)     Status: None (Preliminary result)   Collection Time: 06/06/19 10:22 AM   Specimen: Tracheal Aspirate;  Respiratory  Result Value Ref Range Status   Specimen Description   Final    TRACHEAL ASPIRATE Performed at [facility], [address] [place], [place] [address]    Special Requests   Final    NONE Performed at [facility], [address] [place], [place] [address]    Gram Stain   Final    RARE WBC PRESENT, PREDOMINANTLY PMN NO ORGANISMS SEEN    Culture   Final    CULTURE REINCUBATED FOR BETTER GROWTH Performed at [facility], [place] [address], [place], [place] [address]    Report Status PENDING  Incomplete    Coagulation Studies: Recent Labs    06/06/19 0740  LABPROT 13.2  INR 1.0    Urinalysis: No results for input(s): COLORURINE, LABSPEC, PHURINE, GLUCOSEU, HGBUR, BILIRUBINUR, KETONESUR, PROTEINUR, UROBILINOGEN, NITRITE, LEUKOCYTESUR in the last 72 hours.  Invalid input(s): APPERANCEUR    Imaging: EEG  Result Date: 06/06/2019 Lora Havens, MD     06/06/2019  9:42 AM Patient Name: Jake Gomez MRN: [id number] Epilepsy Attending: Lora Havens Referring Physician/Provider: Dr. [name] Date: 06/05/2019 Duration: 22.30 minutes Patient history: 48 year old male who presented with an episode of syncope and seizure-like activity.  EEG to evaluate for seizures. Level of alertness: Comatose/sedated AEDs during EEG study: None Technical aspects: This EEG study was done with scalp electrodes positioned according to the 10-20 International system of electrode placement. Electrical activity was acquired at a sampling rate of _0  and reviewed with a high frequency filter of _1  and a low frequency filter of _2 . EEG data were recorded continuously and digitally stored. Description: EEG showed continuous generalized 3 to 6 Hz theta-delta slowing.  EEG was reactive to tactile stimulation.  Hyperventilation photic stimulation were not performed. Abnormality -Continuous slow, generalized IMPRESSION: This study is suggestive of moderate to severe  diffuse encephalopathy, nonspecific etiology.  No seizures or epileptiform discharges were seen throughout the recording.   DG Chest Port 1 View  Result Date: 06/07/2019 CLINICAL DATA:  Acute respiratory failure. EXAM: PORTABLE CHEST 1 VIEW COMPARISON:  06/10/2019 FINDINGS: Endotracheal tube unchanged. Nasogastric tube is coiled once over the stomach with tip in the gastric fundus. Lungs are hypoinflated with stable opacification over the left mid to lower lung likely small effusion with atelectasis. Right lung is clear. Cardiomediastinal silhouette and remainder of the exam is unchanged. IMPRESSION: Hypoinflation with stable mild opacification over the left mid to lower lung likely atelectasis and small amount of left pleural fluid. Tubes and lines as described. Electronically Signed   By: Marin Olp M.D.   On: 06/07/2019 07:40   ECHOCARDIOGRAM COMPLETE  Result Date: 06/06/2019   ECHOCARDIOGRAM REPORT   Patient Name:   Jake Gomez Date of Exam: 06/06/2019 Medical Rec #:  [id number]    Height:       68.0 in Accession #:    [id number]   Weight:       272.7 lb Date of Birth:  [birth year]    BSA:          2.33 m Patient Age:    48 years     BP:  124/49 mmHg Patient Gender: M            HR:           84 bpm. Exam Location:  [facility] Procedure: 2D Echo, Color Doppler, Cardiac Doppler and Intracardiac            Opacification Agent Indications:     R06.89 Acute Respiratory Insufficiency  History:         Patient has prior history of Echocardiogram examinations, most                  recent 06/03/2019. Renal disease; Risk Factors:Hypertension and                  Diabetes.  Sonographer:     Charmayne Sheer RDCS (AE) Referring Phys:  [id number] Flora Lipps Diagnosing Phys: Ida Rogue MD  Sonographer Comments: Echo performed with patient supine and on artificial respirator and Technically difficult study due to poor echo windows. Image acquisition challenging due to patient body habitus. IMPRESSIONS  1. Left  ventricular ejection fraction, by visual estimation, is 60 to 65%. The left ventricle has normal function. There is no left ventricular hypertrophy.  2. Left ventricular diastolic parameters are consistent with Grade I diastolic dysfunction (impaired relaxation).  3. The left ventricle has no regional wall motion abnormalities.  4. Global right ventricle has normal systolic function.The right ventricular size is normal. No increase in right ventricular wall thickness.  5. Left atrial size was moderately dilated.  6. TR signal is inadequate for assessing pulmonary artery systolic pressure.  7. Definity contrast agent was given IV to delineate the left ventricular endocardial borders. FINDINGS  Left Ventricle: Left ventricular ejection fraction, by visual estimation, is 60 to 65%. The left ventricle has normal function. Definity contrast agent was given IV to delineate the left ventricular endocardial borders. The left ventricle has no regional wall motion abnormalities. There is no left ventricular hypertrophy. Left ventricular diastolic parameters are consistent with Grade I diastolic dysfunction (impaired relaxation). Normal left atrial pressure. Right Ventricle: The right ventricular size is normal. No increase in right ventricular wall thickness. Global RV systolic function is has normal systolic function. Left Atrium: Left atrial size was moderately dilated. Right Atrium: Right atrial size was normal in size Pericardium: There is no evidence of pericardial effusion. Mitral Valve: The mitral valve is normal in structure. Moderate mitral annular calcification. Mild mitral valve regurgitation. No evidence of mitral valve stenosis by observation. MV peak gradient, 10.1 mmHg. Tricuspid Valve: The tricuspid valve is normal in structure. Tricuspid valve regurgitation is mild. Aortic Valve: The aortic valve is normal in structure. Aortic valve regurgitation is not visualized. The aortic valve is structurally normal,  with no evidence of sclerosis or stenosis. Aortic valve mean gradient measures 8.0 mmHg. Aortic valve peak gradient measures 14.0 mmHg. Aortic valve area, by VTI measures 2.51 cm. Pulmonic Valve: The pulmonic valve was normal in structure. Pulmonic valve regurgitation is not visualized. Pulmonic regurgitation is not visualized. Aorta: The aortic root, ascending aorta and aortic arch are all structurally normal, with no evidence of dilitation or obstruction. Venous: The inferior vena cava is normal in size with greater than 50% respiratory variability, suggesting right atrial pressure of 3 mmHg. IAS/Shunts: No atrial level shunt detected by color flow Doppler. There is no evidence of a patent foramen ovale. No ventricular septal defect is seen or detected. There is no evidence of an atrial septal defect.  LEFT VENTRICLE PLAX 2D LVIDd:  4.19 cm  Diastology LVIDs:         2.55 cm  LV e' lateral:   6.09 cm/s LV PW:         1.07 cm  LV E/e' lateral: 19.9 LV IVS:        1.12 cm  LV e' medial:    7.29 cm/s LVOT diam:     1.90 cm  LV E/e' medial:  16.6 LV SV:         55 ml LV SV Index:   21.92 LVOT Area:     2.84 cm  LEFT ATRIUM              Index LA diam:        5.10 cm  2.19 cm/m LA Vol (A2C):   79.5 ml  34.10 ml/m LA Vol (A4C):   105.0 ml 45.03 ml/m LA Biplane Vol: 90.3 ml  38.73 ml/m  AORTIC VALVE                    PULMONIC VALVE AV Area (Vmax):    2.32 cm     PV Vmax:       1.36 m/s AV Area (Vmean):   2.62 cm     PV Vmean:      89.500 cm/s AV Area (VTI):     2.51 cm     PV VTI:        0.248 m AV Vmax:           187.00 cm/s  PV Peak grad:  7.4 mmHg AV Vmean:          130.000 cm/s PV Mean grad:  4.0 mmHg AV VTI:            0.342 m AV Peak Grad:      14.0 mmHg AV Mean Grad:      8.0 mmHg LVOT Vmax:         153.00 cm/s LVOT Vmean:        120.000 cm/s LVOT VTI:          0.303 m LVOT/AV VTI ratio: 0.89  AORTA Ao Root diam: 3.00 cm MITRAL VALVE MV Area (PHT): 3.03 cm              SHUNTS MV Peak grad:  10.1  mmHg             Systemic VTI:  0.30 m MV Mean grad:  6.0 mmHg              Systemic Diam: 1.90 cm MV Vmax:       1.59 m/s MV Vmean:      115.0 cm/s MV VTI:        0.47 m MV PHT:        72.50 msec MV Decel Time: 250 msec MV E velocity: 121.00 cm/s 103 cm/s MV A velocity: 133.00 cm/s 70.3 cm/s MV E/A ratio:  0.91        1.5  Ida Rogue MD Electronically signed by Ida Rogue MD Signature Date/Time: 06/06/2019/5:52:43 PM    Final      Medications:   . sodium chloride Stopped (06/07/19 0405)  . amiodarone 30 mg/hr (06/07/19 0900)  . ampicillin-sulbactam (UNASYN) IV Stopped (06/07/19 0356)  . dexmedetomidine (PRECEDEX) IV infusion    . fentaNYL infusion INTRAVENOUS 100 mcg/hr (06/07/19 0955)  . norepinephrine (LEVOPHED) Adult infusion 3 mcg/min (06/06/19 0019)   .  stroke: mapping our early stages of recovery book   Does  not apply Once  . aspirin  324 mg Per Tube Daily  . atorvastatin  80 mg Per Tube q morning - 10a  . B-complex with vitamin C  1 tablet Per Tube Daily  . chlorhexidine gluconate (MEDLINE KIT)  15 mL Mouth Rinse BID  . Chlorhexidine Gluconate Cloth  6 each Topical Q0600  . docusate  100 mg Per Tube BID  . epoetin (EPOGEN/PROCRIT) injection  10,000 Units Intravenous Q T,Th,Sa-HD  . feeding supplement (PRO-STAT SUGAR FREE 64)  60 mL Per Tube TID  . feeding supplement (VITAL HIGH PROTEIN)  1,000 mL Per Tube Q24H  . insulin aspart  0-9 Units Subcutaneous Q4H  . mouth rinse  15 mL Mouth Rinse 10 times per day  . pantoprazole sodium  40 mg Per Tube Daily  . polyethylene glycol  17 g Per Tube Daily  . sodium chloride flush  10-40 mL Intracatheter Q12H   acetaminophen **OR** acetaminophen, heparin, ipratropium-albuterol, midazolam, ondansetron **OR** ondansetron (ZOFRAN) IV, sodium chloride flush, vecuronium  Assessment/ Plan:  Jake Gomez is a 48 y.o. Hispanic male with end stage renal disease, diabetes mellitus type I, hypertension, sleep apnea who was admitted to  [facility] on 06/09/2019 for 05/19/2019  [facility] MWF [address] RIJ 120kg  1. End stage renal disease on hemodialysis  - hemodialysis treatment for today. Orders prepared.  Complication of dialysis device: permcath removed. Currently with temp HD catheter. Will attempt to use AVF today.   2. Hypotension: with sepsis and pneumonia - broad spectrum antibiotics: unasyn - weaned off norepinephrine .  3. Pneumonia: with acute respiratory failure requiring intubation and mechanical ventilation. Secondary to pneumonia - Appreciate critical care input.   4. Anemia of chronic kidney disease: macrocytic. Hemoglobin 8.3 - EPO with HD treatment.   5. Secondary Hyperparathyroidism with hypocalcemia and hyperphosphatemia.  - discontinued calcium acetate   LOS: 5 Thelma Lorenzetti 12/24/202010:19 AM

## 2019-06-08 ENCOUNTER — Inpatient Hospital Stay: Payer: Medicare Other

## 2019-06-08 DIAGNOSIS — U071 COVID-19: Secondary | ICD-10-CM

## 2019-06-08 LAB — BASIC METABOLIC PANEL
Anion gap: 15 (ref 5–15)
BUN: 62 mg/dL — ABNORMAL HIGH (ref 6–20)
CO2: 22 mmol/L (ref 22–32)
Calcium: 8 mg/dL — ABNORMAL LOW (ref 8.9–10.3)
Chloride: 100 mmol/L (ref 98–111)
Creatinine, Ser: 5.26 mg/dL — ABNORMAL HIGH (ref 0.61–1.24)
GFR calc Af Amer: 14 mL/min — ABNORMAL LOW (ref >60)
GFR calc non Af Amer: 12 mL/min — ABNORMAL LOW (ref >60)
Glucose, Bld: 152 mg/dL — ABNORMAL HIGH (ref 70–99)
Potassium: 3.7 mmol/L (ref 3.5–5.1)
Sodium: 137 mmol/L (ref 135–145)

## 2019-06-08 LAB — CBC
HCT: 24.1 % — ABNORMAL LOW (ref 39.0–52.0)
Hemoglobin: 7.8 g/dL — ABNORMAL LOW (ref 13.0–17.0)
MCH: 32.2 pg (ref 26.0–34.0)
MCHC: 32.4 g/dL (ref 30.0–36.0)
MCV: 99.6 fL (ref 80.0–100.0)
Platelets: 129 10*3/uL — ABNORMAL LOW (ref 150–400)
RBC: 2.42 MIL/uL — ABNORMAL LOW (ref 4.22–5.81)
RDW: 15.3 % (ref 11.5–15.5)
WBC: 8.2 10*3/uL (ref 4.0–10.5)
nRBC: 0.7 % — ABNORMAL HIGH (ref 0.0–0.2)

## 2019-06-08 LAB — GLUCOSE, CAPILLARY
Glucose-Capillary: 134 mg/dL — ABNORMAL HIGH (ref 70–99)
Glucose-Capillary: 142 mg/dL — ABNORMAL HIGH (ref 70–99)
Glucose-Capillary: 151 mg/dL — ABNORMAL HIGH (ref 70–99)
Glucose-Capillary: 157 mg/dL — ABNORMAL HIGH (ref 70–99)
Glucose-Capillary: 172 mg/dL — ABNORMAL HIGH (ref 70–99)
Glucose-Capillary: 172 mg/dL — ABNORMAL HIGH (ref 70–99)
Glucose-Capillary: 190 mg/dL — ABNORMAL HIGH (ref 70–99)

## 2019-06-08 MED ORDER — SENNOSIDES-DOCUSATE SODIUM 8.6-50 MG PO TABS
1.0000 | ORAL_TABLET | Freq: Two times a day (BID) | ORAL | Status: DC
  Administered 2019-06-09 – 2019-06-17 (×15): 1
  Filled 2019-06-08 (×15): qty 1

## 2019-06-08 MED ORDER — IPRATROPIUM-ALBUTEROL 0.5-2.5 (3) MG/3ML IN SOLN
3.0000 mL | Freq: Three times a day (TID) | RESPIRATORY_TRACT | Status: DC
  Administered 2019-06-08 – 2019-06-24 (×48): 3 mL via RESPIRATORY_TRACT
  Filled 2019-06-08 (×48): qty 3

## 2019-06-08 MED ORDER — FENTANYL CITRATE (PF) 100 MCG/2ML IJ SOLN
100.0000 ug | Freq: Once | INTRAMUSCULAR | Status: AC
  Administered 2019-06-08: 100 ug via INTRAVENOUS

## 2019-06-08 MED ORDER — FENTANYL CITRATE (PF) 100 MCG/2ML IJ SOLN
INTRAMUSCULAR | Status: AC
  Filled 2019-06-08: qty 2

## 2019-06-08 MED ORDER — MIDAZOLAM HCL 2 MG/2ML IJ SOLN
4.0000 mg | Freq: Once | INTRAMUSCULAR | Status: AC
  Administered 2019-06-08: 4 mg via INTRAVENOUS

## 2019-06-08 NOTE — Procedures (Signed)
Endotracheal Intubation: Patient required placement of an artificial airway secondary to Respiratory Failure  Consent: Emergent.   THERE WAS A CUFF LEAK NOTED PATIENT WAS EXTUBATED AND RE-INTUBATED  Hand washing performed prior to starting the procedure.   Medications administered for sedation prior to procedure:  Midazolam 4 mg IV,  Vecuronium 10 mg IV, Fentanyl 100 mcg IV.    A time out procedure was called and correct patient, name, & ID confirmed. Needed supplies and equipment were assembled and checked to include ETT, 10 ml syringe, Glidescope, Mac and Miller blades, suction, oxygen and bag mask valve, end tidal CO2 monitor.   Patient was positioned to align the mouth and pharynx to facilitate visualization of the glottis.   Heart rate, SpO2 and blood pressure was continuously monitored during the procedure. Pre-oxygenation was conducted prior to intubation and endotracheal tube was placed through the vocal cords into the trachea.     The artificial airway was placed under direct visualization via glidescope route using a 8.0  ETT on the first attempt.  ETT was secured at 25 cm mark.  Placement was confirmed by auscuitation of lungs with good breath sounds bilaterally and no stomach sounds.  Condensation was noted on endotracheal tube.   Pulse ox 98%.  CO2 detector in place with appropriate color change.   Complications: None .   Operator: Deunte Bledsoe.   Chest radiograph ordered and pending.   Comments:  glidescope.  Corrin Parker, M.D.  [name]  Medical Director [place] Director [facility] Cardio-Pulmonary Department

## 2019-06-08 NOTE — Progress Notes (Signed)
Uneventful day.

## 2019-06-08 NOTE — Progress Notes (Signed)
CRITICAL CARE NOTE  48 yo Hispanic male with acute and severehypoxicresp failure from acute pneumonia with previous COVID-19 infection with acute CVA     CC  follow up respiratory failure  SUBJECTIVE Patient remains critically ill Prognosis is guarded Remains on vent Prognosis is poor Plan for TEE at some point POST COVID pneumonitis and CVA  BP (!) 117/54   Pulse 77   Temp 99 F (37.2 C) (Axillary)   Resp 16   Ht 5' 7.99" (1.727 m)   Wt 122.7 kg   SpO2 98%   BMI 41.14 kg/m    I/O last 3 completed shifts: In: 1944.6 [I.V.:908.5; NG/GT:836; IV Piggyback:200.1] Out: 1591 [Other:1191; Stool:400] No intake/output data recorded.  SpO2: 98 % O2 Flow Rate (L/min): 3 L/min FiO2 (%): 30 %   SIGNIFICANT EVENTS 12/21 Intubated, resp failure, acute CVA Perm cath removed, vasc cath LEFT groin, Triple lumen RT groin placed 12/22 on CRRT, septic shock, severe hypoxia, on vent, unable to wean due to hemodynamic instability 12/23 attemt neuro assessment today, follow up NEURO recs 12/24 remains on vent, try HD today, wean off CRRT 12/25 extubated for cuff leak and re-intubated   REVIEW OF SYSTEMS  PATIENT IS UNABLE TO PROVIDE COMPLETE REVIEW OF SYSTEMS DUE TO SEVERE CRITICAL ILLNESS   PHYSICAL EXAMINATION:  GENERAL:critically ill appearing, +resp distress HEAD: Normocephalic, atraumatic.  EYES: Pupils equal, round, reactive to light.  No scleral icterus.  MOUTH: Moist mucosal membrane. NECK: Supple.  PULMONARY: +rhonchi, +wheezing CARDIOVASCULAR: S1 and S2. Regular rate and rhythm. No murmurs, rubs, or gallops.  GASTROINTESTINAL: Soft, nontender, -distended. No masses. Positive bowel sounds. No hepatosplenomegaly.  MUSCULOSKELETAL: No swelling, clubbing, or edema.  NEUROLOGIC: obtunded, GCS<8 SKIN:intact,warm,dry  MEDICATIONS: I have reviewed all medications and confirmed regimen as documented   CULTURE RESULTS   Recent Results (from the past 240 hour(s))   Respiratory Panel by RT PCR (Flu A&B, Covid) - Nasopharyngeal Swab     Status: None   Collection Time: 05/19/2019  2:35 AM   Specimen: Nasopharyngeal Swab  Result Value Ref Range Status   SARS Coronavirus 2 by RT PCR NEGATIVE NEGATIVE Final    Comment: (NOTE) SARS-CoV-2 target nucleic acids are NOT DETECTED. The SARS-CoV-2 RNA is generally detectable in upper respiratoy specimens during the acute phase of infection. The lowest concentration of SARS-CoV-2 viral copies this assay can detect is 131 copies/mL. A negative result does not preclude SARS-Cov-2 infection and should not be used as the sole basis for treatment or other patient management decisions. A negative result may occur with  improper specimen collection/handling, submission of specimen other than nasopharyngeal swab, presence of viral mutation(s) within the areas targeted by this assay, and inadequate number of viral copies (<131 copies/mL). A negative result must be combined with clinical observations, patient history, and epidemiological information. The expected result is Negative. Fact Sheet for Patients:  PinkCheek.be Fact Sheet for Healthcare Providers:  GravelBags.it This test is not yet ap proved or cleared by the [place] FDA and  has been authorized for detection and/or diagnosis of SARS-CoV-2 by FDA under an Emergency Use Authorization (EUA). This EUA will remain  in effect (meaning this test can be used) for the duration of the COVID-19 declaration under Section 564(b)(1) of the Act, 21 U.S.C. section 360bbb-3(b)(1), unless the authorization is terminated or revoked sooner.    Influenza A by PCR NEGATIVE NEGATIVE Final   Influenza B by PCR NEGATIVE NEGATIVE Final    Comment: (NOTE) The Xpert Xpress SARS-CoV-2/FLU/RSV  assay is intended as an aid in  the diagnosis of influenza from Nasopharyngeal swab specimens and  should not be used as a sole  basis for treatment. Nasal washings and  aspirates are unacceptable for Xpert Xpress SARS-CoV-2/FLU/RSV  testing. Fact Sheet for Patients: PinkCheek.be Fact Sheet for Healthcare Providers: GravelBags.it This test is not yet approved or cleared by the [place] FDA and  has been authorized for detection and/or diagnosis of SARS-CoV-2 by  FDA under an Emergency Use Authorization (EUA). This EUA will remain  in effect (meaning this test can be used) for the duration of the  Covid-19 declaration under Section 564(b)(1) of the Act, 21  U.S.C. section 360bbb-3(b)(1), unless the authorization is  terminated or revoked. Performed at [facility], [address] [place], [place] [address]   Culture, blood (routine x 2)     Status: None   Collection Time: 06/08/2019  2:36 AM   Specimen: BLOOD  Result Value Ref Range Status   Specimen Description BLOOD LEFT ANTECUBITAL  Final   Special Requests   Final    BOTTLES DRAWN AEROBIC AND ANAEROBIC Blood Culture adequate volume   Culture   Final    NO GROWTH 5 DAYS Performed at [facility], [address], [place], [place] [address]    Report Status 06/07/2019 FINAL  Final  Culture, blood (routine x 2)     Status: None   Collection Time: 05/23/2019  2:36 AM   Specimen: BLOOD  Result Value Ref Range Status   Specimen Description BLOOD RIGHT FOREARM  Final   Special Requests   Final    BOTTLES DRAWN AEROBIC AND ANAEROBIC Blood Culture adequate volume   Culture   Final    NO GROWTH 5 DAYS Performed at [facility], [address] [place], [place] [address]    Report Status 06/07/2019 FINAL  Final  Culture, blood (routine x 2)     Status: None   Collection Time: 06/12/2019  3:10 PM   Specimen: BLOOD  Result Value Ref Range Status   Specimen Description BLOOD A-LINE  Final   Special Requests   Final    BOTTLES DRAWN AEROBIC AND ANAEROBIC Blood Culture  results may not be optimal due to an excessive volume of blood received in culture bottles   Culture   Final    NO GROWTH 5 DAYS Performed at [facility], [address] [place], [place] [address]    Report Status 06/07/2019 FINAL  Final  Culture, blood (routine x 2)     Status: None   Collection Time: 06/07/2019  3:10 PM   Specimen: BLOOD  Result Value Ref Range Status   Specimen Description BLOOD A-LINE  Final   Special Requests   Final    BOTTLES DRAWN AEROBIC AND ANAEROBIC Blood Culture results may not be optimal due to an excessive volume of blood received in culture bottles   Culture   Final    NO GROWTH 5 DAYS Performed at [facility], [address] [place], [place] [address]    Report Status 06/07/2019 FINAL  Final  MRSA PCR Screening     Status: None   Collection Time: 06/03/19  5:45 PM   Specimen: Nasopharyngeal  Result Value Ref Range Status   MRSA by PCR NEGATIVE NEGATIVE Final    Comment:        The GeneXpert MRSA Assay (FDA approved for NASAL specimens only), is one component of a comprehensive MRSA colonization surveillance  program. It is not intended to diagnose MRSA infection nor to guide or monitor treatment for MRSA infections. Performed at [facility], [address], [place], [place] [address]   Urine Culture     Status: None   Collection Time: 06/03/19  6:20 PM   Specimen: Urine, Catheterized  Result Value Ref Range Status   Specimen Description   Final    URINE, CATHETERIZED Performed at [facility], [address] [place], [place] [address]    Special Requests   Final    Immunocompromised Performed at [facility], [address] [place], [place] [address]    Culture   Final    NO GROWTH Performed at [facility], [place] [address], [place], [place] [address]    Report Status 06/05/2019 FINAL  Final  Cath Tip Culture     Status: None   Collection Time: 05/24/2019  4:32 PM    Specimen: Catheter Tip; Other  Result Value Ref Range Status   Specimen Description   Final    CATH TIP Performed at [facility], [address], [place], [place] [address]    Special Requests   Final    NONE Performed at [facility], [address] [place], [place] [address]    Culture   Final    NO GROWTH 2 DAYS Performed at [facility], [place] [address], [place], [place] [address]    Report Status 06/07/2019 FINAL  Final  Culture, respiratory (non-expectorated)     Status: None (Preliminary result)   Collection Time: 06/06/19 10:22 AM   Specimen: Tracheal Aspirate; Respiratory  Result Value Ref Range Status   Specimen Description   Final    TRACHEAL ASPIRATE Performed at [facility], [address] [place], [place] [address]    Special Requests   Final    NONE Performed at [facility], [address] [place], [place] [address]    Gram Stain   Final    RARE WBC PRESENT, PREDOMINANTLY PMN NO ORGANISMS SEEN    Culture   Final    CULTURE REINCUBATED FOR BETTER GROWTH Performed at [facility], [place] [address] [place], [place] [address]    Report Status PENDING  Incomplete          IMAGING    DG Chest Port 1 View  Result Date: 06/08/2019 CLINICAL DATA:  Intubation EXAM: PORTABLE CHEST 1 VIEW COMPARISON:  06/07/2019 at 5:41 a.m. FINDINGS: Support Apparatus: --Endotracheal tube: Tip at the level of the clavicular heads. --Enteric tube:Tip and sideport project over the stomach. --Catheter(s):None --Other: None Unchanged small left pleural effusion with basilar atelectasis. IMPRESSION: 1. Unchanged small left pleural effusion and basilar atelectasis. 2. Unchanged position of endotracheal tube. Electronically Signed   By: Ulyses Jarred M.D.   On: 06/08/2019 01:17       Indwelling Urinary Catheter continued, requirement due to   Reason to continue Indwelling Urinary Catheter strict Intake/Output monitoring for  hemodynamic instability   Central Line/ continued, requirement due to  Reason to continue [place] of central venous pressure or other hemodynamic parameters and poor IV access   Ventilator continued, requirement due to severe respiratory failure   Ventilator Sedation RASS 0 to -2      ASSESSMENT AND PLAN SYNOPSIS  Severe ACUTE Hypoxic and Hypercapnic Respiratory Failurefrom pneumonia and acute aspirtaion pneumonia with inability to protect airway due to CVA, septic shock with end-stage renal disease on dialysis  with morbid obesity Post Covid pneumonitis and pneumonia with hypercoagulable state with acute CVA  Severe ACUTE Hypoxic and Hypercapnic Respiratory Failure -continue Full MV support -continue Bronchodilator Therapy -Wean Fio2 and PEEP as tolerated Patient will likely need a trach for survival   KIDNEY INJURY/Renal Failure -follow chem 7 -follow UO -continue Foley Catheter-assess need -Avoid nephrotoxic agents -Recheck creatinine    NEUROLOGY - intubated and sedated - minimal sedation to achieve a RASS goal: -1 Acute CVA-consistent with embolic phenomenon Follow-up neurology recs   CARDIAC ICU monitoring  ID -continue IV abx as prescibed -follow up cultures  GI GI PROPHYLAXIS as indicated  NUTRITIONAL STATUS DIET-->TF's as tolerated Constipation protocol as indicated  ENDO - will use ICU hypoglycemic\Hyperglycemia protocol if indicated   ELECTROLYTES -follow labs as needed -replace as needed -pharmacy consultation and following   DVT/GI PRX ordered TRANSFUSIONS AS NEEDED MONITOR FSBS ASSESS the need for LABS as needed   Critical Care Time devoted to patient care services described in this note is 34 minutes.   Overall, patient is critically ill, prognosis is guarded.  Patient with Multiorgan failure and at high risk for cardiac arrest and death.    I anticipate prolonged ICU length of stay and ventilator support With  multiple strokes patient may end up with tracheostomy and feeding tube Patient with post Covid pneumonitis pneumonia along with hypercoagulable state with acute bilateral infarcts  Corrin Parker, M.D.  [name]  Medical Director [place] Director [facility] Cardio-Pulmonary Department

## 2019-06-08 NOTE — Progress Notes (Signed)
[facility]  ROUNDING NOTE   Subjective:   Taken off CRRT this yesterday.  Afebrile  Objective:  Vital signs in last 24 hours:  Temp:  [98.4 F (36.9 C)-99.1 F (37.3 C)] 99 F (37.2 C) (12/25 0400) Pulse Rate:  [75-90] 77 (12/25 0600) Resp:  [12-31] 16 (12/25 0600) BP: (103-166)/(53-80) 117/54 (12/25 0600) SpO2:  [94 %-100 %] 98 % (12/25 0757) FiO2 (%):  [30 %] 30 % (12/25 0757)  Weight change:  Filed Weights   06/05/19 0448 06/06/19 0424 06/07/19 0248  Weight: 121.9 kg 123.7 kg 122.7 kg    Intake/Output: I/O last 3 completed shifts: In: 1944.6 [I.V.:908.5; NG/GT:836; IV Piggyback:200.1] Out: 7829 [Other:1191; Stool:400]   Intake/Output this shift:  No intake/output data recorded.  Physical Exam: General: Critically ill   Head: ETT   Eyes: Anicteric, PERRL  Neck:  trachea midline  Lungs:  PRVC FiO2 30%  Heart: regular  Abdomen:  Soft, nontender, obese  Extremities:  peripheral edema.  Neurologic: Intubated and sedated  Skin: No lesions  Access: Left femoral temp HD catheter 12/21 Dr. Lucky Cowboy Left Arm AVF Dr. Lucky Cowboy 5/62/13    Basic Metabolic Panel: Recent Labs  Lab 05/19/2019 0456 06/11/2019 1744 05/30/2019 1744 06/05/19 0455 06/05/19 1018 06/05/19 1703 06/06/19 0446 06/06/19 1022 06/06/19 1649 06/06/19 2246 06/07/19 0446 06/08/19 0417  NA 143 136  --  137 137 134* 135 133* 133* 134* 136 137  K 6.4* 5.5*  --  4.5 4.1 3.8 3.8 3.7 3.5 3.4* 3.5 3.7  CL 101 95*  --  98 100 99 99 98 98 99 99 100  CO2 21* 21*  --  23 25 21* '26 23 25 27 23 22  '$ GLUCOSE 227* 113*  --  91 79 116* 157* 194* 198* 185* 188* 152*  BUN 123* 100*  --  69* 55* 44* 38* 36* 33* 32* 32* 62*  CREATININE 15.46* 11.70*  --  7.31* 6.30* 5.07* 3.63* 3.51* 3.14* 2.91* 2.73* 5.26*  CALCIUM 7.2* 6.5*  --  7.7* 8.0* 8.0* 8.4* 8.7* 8.6* 8.6* 8.5* 8.0*  MG  --   --    < > 2.1 1.9 1.9 2.0 1.9 2.0 1.9 2.1  --   PHOS 7.9* 8.1*  --  6.0* 5.0* 4.6  --   --   --   --   --   --    < > = values  in this interval not displayed.    Liver Function Tests: Recent Labs  Lab 05/16/2019 0048 05/16/2019 1941 05/21/2019 1744 06/05/19 0455 06/05/19 1018 06/05/19 1703 06/06/19 1022  AST 27 109*  --   --   --   --  97*  ALT 28 71*  --   --   --   --  59*  ALKPHOS 190* 318*  --   --   --   --  278*  BILITOT 0.8 2.3*  --   --   --   --  3.2*  PROT 8.0 7.4  --   --   --   --  6.6  ALBUMIN 4.0 3.7 2.9* 2.8* 2.7* 2.6* 2.5*   No results for input(s): LIPASE, AMYLASE in the last 168 hours. No results for input(s): AMMONIA in the last 168 hours.  CBC: Recent Labs  Lab 06/03/2019 0048 06/06/2019 1941 05/19/2019 0343 06/05/19 0449 06/06/19 0446 06/07/19 0446 06/08/19 0417  WBC 7.6 1.8* 33.0* 31.3* 19.1* 11.7* 8.2  NEUTROABS 4.9 1.3*  --   --   --   --   --  HGB 9.3* 8.6* 8.6* 7.8* 7.5* 8.3* 7.8*  HCT 29.3* 26.8* 27.3* 24.1* 24.2* 25.2* 24.1*  MCV 102.4* 101.5* 102.2* 103.0* 104.3* 98.1 99.6  PLT 118* 66* 58* 83* 89* 106* 129*    Cardiac Enzymes: No results for input(s): CKTOTAL, CKMB, CKMBINDEX, TROPONINI in the last 168 hours.  BNP: Invalid input(s): POCBNP  CBG: Recent Labs  Lab 06/07/19 1553 06/07/19 1906 06/07/19 2319 06/08/19 0316 06/08/19 0802  GLUCAP 185* 172* 162* 142* 151*    Microbiology: Results for orders placed or performed during the hospital encounter of 06/13/2019  Respiratory Panel by RT PCR (Flu A&B, Covid) - Nasopharyngeal Swab     Status: None   Collection Time: 05/29/2019  2:35 AM   Specimen: Nasopharyngeal Swab  Result Value Ref Range Status   SARS Coronavirus 2 by RT PCR NEGATIVE NEGATIVE Final    Comment: (NOTE) SARS-CoV-2 target nucleic acids are NOT DETECTED. The SARS-CoV-2 RNA is generally detectable in upper respiratoy specimens during the acute phase of infection. The lowest concentration of SARS-CoV-2 viral copies this assay can detect is 131 copies/mL. A negative result does not preclude SARS-Cov-2 infection and should not be used as the sole  basis for treatment or other patient management decisions. A negative result may occur with  improper specimen collection/handling, submission of specimen other than nasopharyngeal swab, presence of viral mutation(s) within the areas targeted by this assay, and inadequate number of viral copies (<131 copies/mL). A negative result must be combined with clinical observations, patient history, and epidemiological information. The expected result is Negative. Fact Sheet for Patients:  PinkCheek.be Fact Sheet for Healthcare Providers:  GravelBags.it This test is not yet ap proved or cleared by the [place] FDA and  has been authorized for detection and/or diagnosis of SARS-CoV-2 by FDA under an Emergency Use Authorization (EUA). This EUA will remain  in effect (meaning this test can be used) for the duration of the COVID-19 declaration under Section 564(b)(1) of the Act, 21 U.S.C. section 360bbb-3(b)(1), unless the authorization is terminated or revoked sooner.    Influenza A by PCR NEGATIVE NEGATIVE Final   Influenza B by PCR NEGATIVE NEGATIVE Final    Comment: (NOTE) The Xpert Xpress SARS-CoV-2/FLU/RSV assay is intended as an aid in  the diagnosis of influenza from Nasopharyngeal swab specimens and  should not be used as a sole basis for treatment. Nasal washings and  aspirates are unacceptable for Xpert Xpress SARS-CoV-2/FLU/RSV  testing. Fact Sheet for Patients: PinkCheek.be Fact Sheet for Healthcare Providers: GravelBags.it This test is not yet approved or cleared by the [place] FDA and  has been authorized for detection and/or diagnosis of SARS-CoV-2 by  FDA under an Emergency Use Authorization (EUA). This EUA will remain  in effect (meaning this test can be used) for the duration of the  Covid-19 declaration under Section 564(b)(1) of the Act, 21  U.S.C.  section 360bbb-3(b)(1), unless the authorization is  terminated or revoked. Performed at [facility], [address] [place], [place] [address]   Culture, blood (routine x 2)     Status: None   Collection Time: 05/24/2019  2:36 AM   Specimen: BLOOD  Result Value Ref Range Status   Specimen Description BLOOD LEFT ANTECUBITAL  Final   Special Requests   Final    BOTTLES DRAWN AEROBIC AND ANAEROBIC Blood Culture adequate volume   Culture   Final    NO GROWTH 5 DAYS Performed at [facility], [place], [place]  [address]    Report Status 06/07/2019 FINAL  Final  Culture, blood (routine x 2)     Status: None   Collection Time: 06/13/2019  2:36 AM   Specimen: BLOOD  Result Value Ref Range Status   Specimen Description BLOOD RIGHT FOREARM  Final   Special Requests   Final    BOTTLES DRAWN AEROBIC AND ANAEROBIC Blood Culture adequate volume   Culture   Final    NO GROWTH 5 DAYS Performed at [facility], [address] [place], [place] [address]    Report Status 06/07/2019 FINAL  Final  Culture, blood (routine x 2)     Status: None   Collection Time: 06/14/2019  3:10 PM   Specimen: BLOOD  Result Value Ref Range Status   Specimen Description BLOOD A-LINE  Final   Special Requests   Final    BOTTLES DRAWN AEROBIC AND ANAEROBIC Blood Culture results may not be optimal due to an excessive volume of blood received in culture bottles   Culture   Final    NO GROWTH 5 DAYS Performed at [facility], [address] [place], [place] [address]    Report Status 06/07/2019 FINAL  Final  Culture, blood (routine x 2)     Status: None   Collection Time: 05/31/2019  3:10 PM   Specimen: BLOOD  Result Value Ref Range Status   Specimen Description BLOOD A-LINE  Final   Special Requests   Final    BOTTLES DRAWN AEROBIC AND ANAEROBIC Blood Culture results may not be optimal due to an excessive volume of blood received in culture bottles   Culture    Final    NO GROWTH 5 DAYS Performed at [facility], [address] [place], [place] [address]    Report Status 06/07/2019 FINAL  Final  MRSA PCR Screening     Status: None   Collection Time: 06/03/19  5:45 PM   Specimen: Nasopharyngeal  Result Value Ref Range Status   MRSA by PCR NEGATIVE NEGATIVE Final    Comment:        The GeneXpert MRSA Assay (FDA approved for NASAL specimens only), is one component of a comprehensive MRSA colonization surveillance program. It is not intended to diagnose MRSA infection nor to guide or monitor treatment for MRSA infections. Performed at [facility], [address] [place], [place] [address]   Urine Culture     Status: None   Collection Time: 06/03/19  6:20 PM   Specimen: Urine, Catheterized  Result Value Ref Range Status   Specimen Description   Final    URINE, CATHETERIZED Performed at [facility], [address] [place], [place] [address]    Special Requests   Final    Immunocompromised Performed at [facility], [address], [place], [place] [address]    Culture   Final    NO GROWTH Performed at [facility], [place] [address] [place], [place] [address]    Report Status 06/05/2019 FINAL  Final  Cath Tip Culture     Status: None   Collection Time: 05/22/2019  4:32 PM   Specimen: Catheter Tip; Other  Result Value Ref Range Status   Specimen Description   Final    CATH TIP Performed at [facility], [address], [place], [place] [address]    Special Requests   Final    NONE Performed at [facility], [address] [place],  [address]  Culture   Final    NO GROWTH 2 DAYS Performed at [facility], [place] [address], [place], [place] [address]    Report Status 06/07/2019 FINAL  Final  Culture, respiratory (non-expectorated)     Status: None (Preliminary result)   Collection Time: 06/06/19 10:22 AM   Specimen: Tracheal Aspirate;  Respiratory  Result Value Ref Range Status   Specimen Description   Final    TRACHEAL ASPIRATE Performed at [facility], [address] [place], [place] [address]    Special Requests   Final    NONE Performed at [facility], [address] [place], [place] [address]    Gram Stain   Final    RARE WBC PRESENT, PREDOMINANTLY PMN NO ORGANISMS SEEN    Culture   Final    CULTURE REINCUBATED FOR BETTER GROWTH Performed at [facility], [place] [address], [place],  [address]    Report Status PENDING  Incomplete    Coagulation Studies: Recent Labs    06/06/19 0740  LABPROT 13.2  INR 1.0    Urinalysis: No results for input(s): COLORURINE, LABSPEC, PHURINE, GLUCOSEU, HGBUR, BILIRUBINUR, KETONESUR, PROTEINUR, UROBILINOGEN, NITRITE, LEUKOCYTESUR in the last 72 hours.  Invalid input(s): APPERANCEUR    Imaging: DG Chest Port 1 View  Result Date: 06/08/2019 CLINICAL DATA:  Intubation EXAM: PORTABLE CHEST 1 VIEW COMPARISON:  06/07/2019 at 5:41 a.m. FINDINGS: Support Apparatus: --Endotracheal tube: Tip at the level of the clavicular heads. --Enteric tube:Tip and sideport project over the stomach. --Catheter(s):None --Other: None Unchanged small left pleural effusion with basilar atelectasis. IMPRESSION: 1. Unchanged small left pleural effusion and basilar atelectasis. 2. Unchanged position of endotracheal tube. Electronically Signed   By: Ulyses Jarred M.D.   On: 06/08/2019 01:17   DG Chest Port 1 View  Result Date: 06/07/2019 CLINICAL DATA:  Acute respiratory failure. EXAM: PORTABLE CHEST 1 VIEW COMPARISON:  06/07/2019 FINDINGS: Endotracheal tube unchanged. Nasogastric tube is coiled once over the stomach with tip in the gastric fundus. Lungs are hypoinflated with stable opacification over the left mid to lower lung likely small effusion with atelectasis. Right lung is clear. Cardiomediastinal silhouette and remainder of the exam is unchanged. IMPRESSION:  Hypoinflation with stable mild opacification over the left mid to lower lung likely atelectasis and small amount of left pleural fluid. Tubes and lines as described. Electronically Signed   By: Marin Olp M.D.   On: 06/07/2019 07:40   ECHOCARDIOGRAM COMPLETE  Result Date: 06/06/2019   ECHOCARDIOGRAM REPORT   Patient Name:   SUKHRAJ ESQUIVIAS Date of Exam: 06/06/2019 Medical Rec #:  [id number]    Height:       68.0 in Accession #:    [id number]   Weight:       272.7 lb Date of Birth:  26-Nov-1970    BSA:          2.33 m Patient Age:    65 years     BP:           124/49 mmHg Patient Gender: M            HR:           84 bpm. Exam Location:  [facility] Procedure: 2D Echo, Color Doppler, Cardiac Doppler and Intracardiac            Opacification Agent Indications:     R06.89 Acute Respiratory Insufficiency  History:         Patient has prior history of Echocardiogram  examinations, most                  recent 06/03/2019. Renal disease; Risk Factors:Hypertension and                  Diabetes.  Sonographer:     Charmayne Sheer RDCS (AE) Referring Phys:  [id number] Flora Lipps Diagnosing Phys: Ida Rogue MD  Sonographer Comments: Echo performed with patient supine and on artificial respirator and Technically difficult study due to poor echo windows. Image acquisition challenging due to patient body habitus. IMPRESSIONS  1. Left ventricular ejection fraction, by visual estimation, is 60 to 65%. The left ventricle has normal function. There is no left ventricular hypertrophy.  2. Left ventricular diastolic parameters are consistent with Grade I diastolic dysfunction (impaired relaxation).  3. The left ventricle has no regional wall motion abnormalities.  4. Global right ventricle has normal systolic function.The right ventricular size is normal. No increase in right ventricular wall thickness.  5. Left atrial size was moderately dilated.  6. TR signal is inadequate for assessing pulmonary artery systolic pressure.  7. Definity contrast  agent was given IV to delineate the left ventricular endocardial borders. FINDINGS  Left Ventricle: Left ventricular ejection fraction, by visual estimation, is 60 to 65%. The left ventricle has normal function. Definity contrast agent was given IV to delineate the left ventricular endocardial borders. The left ventricle has no regional wall motion abnormalities. There is no left ventricular hypertrophy. Left ventricular diastolic parameters are consistent with Grade I diastolic dysfunction (impaired relaxation). Normal left atrial pressure. Right Ventricle: The right ventricular size is normal. No increase in right ventricular wall thickness. Global RV systolic function is has normal systolic function. Left Atrium: Left atrial size was moderately dilated. Right Atrium: Right atrial size was normal in size Pericardium: There is no evidence of pericardial effusion. Mitral Valve: The mitral valve is normal in structure. Moderate mitral annular calcification. Mild mitral valve regurgitation. No evidence of mitral valve stenosis by observation. MV peak gradient, 10.1 mmHg. Tricuspid Valve: The tricuspid valve is normal in structure. Tricuspid valve regurgitation is mild. Aortic Valve: The aortic valve is normal in structure. Aortic valve regurgitation is not visualized. The aortic valve is structurally normal, with no evidence of sclerosis or stenosis. Aortic valve mean gradient measures 8.0 mmHg. Aortic valve peak gradient measures 14.0 mmHg. Aortic valve area, by VTI measures 2.51 cm. Pulmonic Valve: The pulmonic valve was normal in structure. Pulmonic valve regurgitation is not visualized. Pulmonic regurgitation is not visualized. Aorta: The aortic root, ascending aorta and aortic arch are all structurally normal, with no evidence of dilitation or obstruction. Venous: The inferior vena cava is normal in size with greater than 50% respiratory variability, suggesting right atrial pressure of 3 mmHg. IAS/Shunts: No  atrial level shunt detected by color flow Doppler. There is no evidence of a patent foramen ovale. No ventricular septal defect is seen or detected. There is no evidence of an atrial septal defect.  LEFT VENTRICLE PLAX 2D LVIDd:         4.19 cm  Diastology LVIDs:         2.55 cm  LV e' lateral:   6.09 cm/s LV PW:         1.07 cm  LV E/e' lateral: 19.9 LV IVS:        1.12 cm  LV e' medial:    7.29 cm/s LVOT diam:     1.90 cm  LV E/e' medial:  16.6 LV  SV:         55 ml LV SV Index:   21.92 LVOT Area:     2.84 cm  LEFT ATRIUM              Index LA diam:        5.10 cm  2.19 cm/m LA Vol (A2C):   79.5 ml  34.10 ml/m LA Vol (A4C):   105.0 ml 45.03 ml/m LA Biplane Vol: 90.3 ml  38.73 ml/m  AORTIC VALVE                    PULMONIC VALVE AV Area (Vmax):    2.32 cm     PV Vmax:       1.36 m/s AV Area (Vmean):   2.62 cm     PV Vmean:      89.500 cm/s AV Area (VTI):     2.51 cm     PV VTI:        0.248 m AV Vmax:           187.00 cm/s  PV Peak grad:  7.4 mmHg AV Vmean:          130.000 cm/s PV Mean grad:  4.0 mmHg AV VTI:            0.342 m AV Peak Grad:      14.0 mmHg AV Mean Grad:      8.0 mmHg LVOT Vmax:         153.00 cm/s LVOT Vmean:        120.000 cm/s LVOT VTI:          0.303 m LVOT/AV VTI ratio: 0.89  AORTA Ao Root diam: 3.00 cm MITRAL VALVE MV Area (PHT): 3.03 cm              SHUNTS MV Peak grad:  10.1 mmHg             Systemic VTI:  0.30 m MV Mean grad:  6.0 mmHg              Systemic Diam: 1.90 cm MV Vmax:       1.59 m/s MV Vmean:      115.0 cm/s MV VTI:        0.47 m MV PHT:        72.50 msec MV Decel Time: 250 msec MV E velocity: 121.00 cm/s 103 cm/s MV A velocity: 133.00 cm/s 70.3 cm/s MV E/A ratio:  0.91        1.5  Ida Rogue MD Electronically signed by Ida Rogue MD Signature Date/Time: 06/06/2019/5:52:43 PM    Final      Medications:   . sodium chloride Stopped (06/07/19 0405)  . amiodarone 30 mg/hr (06/08/19 0400)  . ampicillin-sulbactam (UNASYN) IV    . dexmedetomidine (PRECEDEX) IV  infusion    . fentaNYL infusion INTRAVENOUS 175 mcg/hr (06/08/19 0544)  . norepinephrine (LEVOPHED) Adult infusion 3 mcg/min (06/06/19 0019)   .  stroke: mapping our early stages of recovery book   Does not apply Once  . aspirin  324 mg Per Tube Daily  . atorvastatin  80 mg Per Tube q morning - 10a  . B-complex with vitamin C  1 tablet Per Tube Daily  . chlorhexidine gluconate (MEDLINE KIT)  15 mL Mouth Rinse BID  . Chlorhexidine Gluconate Cloth  6 each Topical Q0600  . epoetin (EPOGEN/PROCRIT) injection  10,000 Units Intravenous Q T,Th,Sa-HD  . feeding supplement (PRO-STAT SUGAR FREE  64)  60 mL Per Tube TID  . feeding supplement (VITAL HIGH PROTEIN)  1,000 mL Per Tube Q24H  . insulin aspart  0-9 Units Subcutaneous Q4H  . ipratropium-albuterol  3 mL Nebulization TID  . mouth rinse  15 mL Mouth Rinse 10 times per day  . pantoprazole sodium  40 mg Per Tube Daily  . polyethylene glycol  17 g Per Tube Daily  . senna-docusate  1 tablet Per Tube BID  . sodium chloride flush  10-40 mL Intracatheter Q12H   acetaminophen **OR** acetaminophen, heparin, ipratropium-albuterol, midazolam, ondansetron **OR** ondansetron (ZOFRAN) IV, sodium chloride flush, vecuronium  Assessment/ Plan:  Mr. Henning Ehle is a 48 y.o. Hispanic male with end stage renal disease, diabetes mellitus type I, hypertension, sleep apnea who was [facility] 05/21/2019 for 05/26/2019  [facility] M[facility] [address] RIJ 120kg  1. End stage renal disease on hemodialysis  Complication of dialysis device: permcath removed. Currently with temp HD catheter. Will attempt to use AVF on next treatment scheduled for tomorrow.   2. Hypotension: with sepsis and pneumonia - broad spectrum antibiotics: unasyn - weaned off norepinephrine .  3. Pneumonia: with acute respiratory failure requiring intubation and mechanical ventilation. Secondary to pneumonia - Appreciate critical care input.   4. Anemia of chronic kidney disease:  macrocytic. Hemoglobin 7.8 - EPO with HD treatment.   5. Secondary Hyperparathyroidism with hypocalcemia and hyperphosphatemia.  - discontinued calcium acetate   LOS: 6 Lulu Hirschmann 12/25/20209:49 AM

## 2019-06-09 DIAGNOSIS — L8990 Pressure ulcer of unspecified site, unspecified stage: Secondary | ICD-10-CM | POA: Insufficient documentation

## 2019-06-09 LAB — BASIC METABOLIC PANEL
Anion gap: 16 — ABNORMAL HIGH (ref 5–15)
BUN: 87 mg/dL — ABNORMAL HIGH (ref 6–20)
CO2: 20 mmol/L — ABNORMAL LOW (ref 22–32)
Calcium: 7.4 mg/dL — ABNORMAL LOW (ref 8.9–10.3)
Chloride: 99 mmol/L (ref 98–111)
Creatinine, Ser: 7.3 mg/dL — ABNORMAL HIGH (ref 0.61–1.24)
GFR calc Af Amer: 9 mL/min — ABNORMAL LOW (ref >60)
GFR calc non Af Amer: 8 mL/min — ABNORMAL LOW (ref >60)
Glucose, Bld: 181 mg/dL — ABNORMAL HIGH (ref 70–99)
Potassium: 4 mmol/L (ref 3.5–5.1)
Sodium: 135 mmol/L (ref 135–145)

## 2019-06-09 LAB — CBC WITH DIFFERENTIAL/PLATELET
Abs Immature Granulocytes: 1.47 10*3/uL — ABNORMAL HIGH (ref 0.00–0.07)
Basophils Absolute: 0.1 10*3/uL (ref 0.0–0.1)
Basophils Relative: 1 %
Eosinophils Absolute: 0.3 10*3/uL (ref 0.0–0.5)
Eosinophils Relative: 3 %
HCT: 24.4 % — ABNORMAL LOW (ref 39.0–52.0)
Hemoglobin: 7.9 g/dL — ABNORMAL LOW (ref 13.0–17.0)
Immature Granulocytes: 17 %
Lymphocytes Relative: 14 %
Lymphs Abs: 1.2 10*3/uL (ref 0.7–4.0)
MCH: 32.1 pg (ref 26.0–34.0)
MCHC: 32.4 g/dL (ref 30.0–36.0)
MCV: 99.2 fL (ref 80.0–100.0)
Monocytes Absolute: 1.2 10*3/uL — ABNORMAL HIGH (ref 0.1–1.0)
Monocytes Relative: 13 %
Neutro Abs: 4.5 10*3/uL (ref 1.7–7.7)
Neutrophils Relative %: 52 %
Platelets: 140 10*3/uL — ABNORMAL LOW (ref 150–400)
RBC: 2.46 MIL/uL — ABNORMAL LOW (ref 4.22–5.81)
RDW: 15.8 % — ABNORMAL HIGH (ref 11.5–15.5)
WBC Morphology: INCREASED
WBC: 8.7 10*3/uL (ref 4.0–10.5)
nRBC: 0.9 % — ABNORMAL HIGH (ref 0.0–0.2)

## 2019-06-09 LAB — GLUCOSE, CAPILLARY
Glucose-Capillary: 161 mg/dL — ABNORMAL HIGH (ref 70–99)
Glucose-Capillary: 179 mg/dL — ABNORMAL HIGH (ref 70–99)
Glucose-Capillary: 179 mg/dL — ABNORMAL HIGH (ref 70–99)
Glucose-Capillary: 202 mg/dL — ABNORMAL HIGH (ref 70–99)
Glucose-Capillary: 171 mg/dL — ABNORMAL HIGH (ref 70–99)
Glucose-Capillary: 192 mg/dL — ABNORMAL HIGH (ref 70–99)

## 2019-06-09 LAB — CULTURE, RESPIRATORY W GRAM STAIN

## 2019-06-09 LAB — CBC
HCT: 26.4 % — ABNORMAL LOW (ref 39.0–52.0)
Hemoglobin: 8.4 g/dL — ABNORMAL LOW (ref 13.0–17.0)
MCH: 32.7 pg (ref 26.0–34.0)
MCHC: 31.8 g/dL (ref 30.0–36.0)
MCV: 102.7 fL — ABNORMAL HIGH (ref 80.0–100.0)
Platelets: 163 10*3/uL (ref 150–400)
RBC: 2.57 MIL/uL — ABNORMAL LOW (ref 4.22–5.81)
RDW: 15.9 % — ABNORMAL HIGH (ref 11.5–15.5)
WBC: 8.4 10*3/uL (ref 4.0–10.5)
nRBC: 1.1 % — ABNORMAL HIGH (ref 0.0–0.2)

## 2019-06-09 MED ORDER — SODIUM CHLORIDE 0.9 % IV SOLN
3.0000 g | INTRAVENOUS | Status: DC
  Administered 2019-06-09 – 2019-06-10 (×2): 3 g via INTRAVENOUS
  Filled 2019-06-09: qty 3
  Filled 2019-06-09 (×2): qty 8

## 2019-06-09 MED ORDER — SODIUM CHLORIDE 0.9 % IV SOLN
3.0000 g | Freq: Two times a day (BID) | INTRAVENOUS | Status: DC
  Filled 2019-06-09 (×2): qty 8

## 2019-06-09 NOTE — Progress Notes (Signed)
This note also relates to the following rows which could not be included: Pulse Rate - Cannot attach notes to unvalidated device data Resp - Cannot attach notes to unvalidated device data BP - Cannot attach notes to unvalidated device data SpO2 - Cannot attach notes to unvalidated device data End Tidal CO2 (EtCO2) - Cannot attach notes to unvalidated device data    06/09/19 1300  Vital Signs  Pulse Rate Source Monitor  HD COMPLETE TOLERADTED WELL CVC WDL UFG ACHIEVED 2L

## 2019-06-09 NOTE — Progress Notes (Signed)
[facility]  ROUNDING NOTE   Subjective:   Seen and examined on hemodialysis treatment. Tolerating treatment well.    HEMODIALYSIS FLOWSHEET:  Blood Flow Rate (mL/min): 350 mL/min Arterial Pressure (mmHg): -160 mmHg Venous Pressure (mmHg): 120 mmHg Transmembrane Pressure (mmHg): 50 mmHg Ultrafiltration Rate (mL/min): 550 mL/min Dialysate Flow Rate (mL/min): 800 ml/min Conductivity: Machine : 13.9 Conductivity: Machine : 13.9 Dialysis Fluid Bolus: Meds Bolus Amount (mL): 400 mL    Objective:  Vital signs in last 24 hours:  Temp:  [98.2 F (36.8 C)-99 F (37.2 C)] 99 F (37.2 C) (12/26 0800) Pulse Rate:  [66-89] 86 (12/26 0900) Resp:  [12-22] 14 (12/26 0900) BP: (104-193)/(42-64) 179/61 (12/26 0900) SpO2:  [95 %-100 %] 96 % (12/26 0934) FiO2 (%):  [21 %-24 %] 21 % (12/26 0934)  Weight change:  Filed Weights   06/05/19 0448 06/06/19 0424 06/07/19 0248  Weight: 121.9 kg 123.7 kg 122.7 kg    Intake/Output: I/O last 3 completed shifts: In: 2907.7 [I.V.:1801.7; NG/GT:906; IV Piggyback:200] Out: 1500 [Stool:1500]   Intake/Output this shift:  No intake/output data recorded.  Physical Exam: General: Critically ill   Head: ETT   Eyes: Anicteric, PERRL  Neck:  trachea midline  Lungs:  PRVC FiO2 21%  Heart: regular  Abdomen:  Soft, nontender, obese  Extremities:  peripheral edema.  Neurologic: Intubated and sedated  Skin: No lesions  Access: Left femoral temp HD catheter 12/21 Dr. Lucky Cowboy Left Arm AVF Dr. Lucky Cowboy 2/35/36    Basic Metabolic Panel: Recent Labs  Lab 06/13/2019 1744 05/24/2019 1744 06/05/19 0455 06/05/19 1018 06/05/19 1703 06/06/19 0446 06/06/19 1022 06/06/19 1649 06/06/19 2246 06/07/19 0446 06/08/19 0417 06/09/19 0430  NA 136  --  137 137 134* 135 133* 133* 134* 136 137 135  K 5.5*  --  4.5 4.1 3.8 3.8 3.7 3.5 3.4* 3.5 3.7 4.0  CL 95*  --  '98 100 99 99 98 98 99 99 100 99 '$  CO2 21*  --  23 25 21* '26 23 25 27 23 22 '$ 20*  GLUCOSE 113*  --   91 79 116* 157* 194* 198* 185* 188* 152* 181*  BUN 100*  --  69* 55* 44* 38* 36* 33* 32* 32* 62* 87*  CREATININE 11.70*  --  7.31* 6.30* 5.07* 3.63* 3.51* 3.14* 2.91* 2.73* 5.26* 7.30*  CALCIUM 6.5*  --  7.7* 8.0* 8.0* 8.4* 8.7* 8.6* 8.6* 8.5* 8.0* 7.4*  MG  --    < > 2.1 1.9 1.9 2.0 1.9 2.0 1.9 2.1  --   --   PHOS 8.1*  --  6.0* 5.0* 4.6  --   --   --   --   --   --   --    < > = values in this interval not displayed.    Liver Function Tests: Recent Labs  Lab 06/07/2019 1941 06/13/2019 1744 06/05/19 0455 06/05/19 1018 06/05/19 1703 06/06/19 1022  AST 109*  --   --   --   --  97*  ALT 71*  --   --   --   --  59*  ALKPHOS 318*  --   --   --   --  278*  BILITOT 2.3*  --   --   --   --  3.2*  PROT 7.4  --   --   --   --  6.6  ALBUMIN 3.7 2.9* 2.8* 2.7* 2.6* 2.5*   No results for input(s): LIPASE, AMYLASE in  the last 168 hours. No results for input(s): AMMONIA in the last 168 hours.  CBC: Recent Labs  Lab 05/21/2019 1941 06/05/19 0449 06/06/19 0446 06/07/19 0446 06/08/19 0417 06/09/19 0430  WBC 1.8* 31.3* 19.1* 11.7* 8.2 8.7  NEUTROABS 1.3*  --   --   --   --  4.5  HGB 8.6* 7.8* 7.5* 8.3* 7.8* 7.9*  HCT 26.8* 24.1* 24.2* 25.2* 24.1* 24.4*  MCV 101.5* 103.0* 104.3* 98.1 99.6 99.2  PLT 66* 83* 89* 106* 129* 140*    Cardiac Enzymes: No results for input(s): CKTOTAL, CKMB, CKMBINDEX, TROPONINI in the last 168 hours.  BNP: Invalid input(s): POCBNP  CBG: Recent Labs  Lab 06/08/19 1523 06/08/19 1956 06/08/19 2332 06/09/19 0321 06/09/19 0731  GLUCAP 157* 172* 190* 161* 179*    Microbiology: Results for orders placed or performed during the hospital encounter of 06/09/2019  Respiratory Panel by RT PCR (Flu A&B, Covid) - Nasopharyngeal Swab     Status: None   Collection Time: 06/10/2019  2:35 AM   Specimen: Nasopharyngeal Swab  Result Value Ref Range Status   SARS Coronavirus 2 by RT PCR NEGATIVE NEGATIVE Final    Comment: (NOTE) SARS-CoV-2 target nucleic acids are NOT  DETECTED. The SARS-CoV-2 RNA is generally detectable in upper respiratoy specimens during the acute phase of infection. The lowest concentration of SARS-CoV-2 viral copies this assay can detect is 131 copies/mL. A negative result does not preclude SARS-Cov-2 infection and should not be used as the sole basis for treatment or other patient management decisions. A negative result may occur with  improper specimen collection/handling, submission of specimen other than nasopharyngeal swab, presence of viral mutation(s) within the areas targeted by this assay, and inadequate number of viral copies (<131 copies/mL). A negative result must be combined with clinical observations, patient history, and epidemiological information. The expected result is Negative. Fact Sheet for Patients:  PinkCheek.be Fact Sheet for Healthcare Providers:  GravelBags.it This test is not yet ap proved or cleared by the [place] FDA and  has been authorized for detection and/or diagnosis of SARS-CoV-2 by FDA under an Emergency Use Authorization (EUA). This EUA will remain  in effect (meaning this test can be used) for the duration of the COVID-19 declaration under Section 564(b)(1) of the Act, 21 U.S.C. section 360bbb-3(b)(1), unless the authorization is terminated or revoked sooner.    Influenza A by PCR NEGATIVE NEGATIVE Final   Influenza B by PCR NEGATIVE NEGATIVE Final    Comment: (NOTE) The Xpert Xpress SARS-CoV-2/FLU/RSV assay is intended as an aid in  the diagnosis of influenza from Nasopharyngeal swab specimens and  should not be used as a sole basis for treatment. Nasal washings and  aspirates are unacceptable for Xpert Xpress SARS-CoV-2/FLU/RSV  testing. Fact Sheet for Patients: PinkCheek.be Fact Sheet for Healthcare Providers: GravelBags.it This test is not yet approved or cleared  by the [place] FDA and  has been authorized for detection and/or diagnosis of SARS-CoV-2 by  FDA under an Emergency Use Authorization (EUA). This EUA will remain  in effect (meaning this test can be used) for the duration of the  Covid-19 declaration under Section 564(b)(1) of the Act, 21  U.S.C. section 360bbb-3(b)(1), unless the authorization is  terminated or revoked. Performed at [facility], [address] [place], [place] [address]   Culture, blood (routine x 2)     Status: None   Collection Time: 06/14/2019  2:36 AM   Specimen: BLOOD  Result Value Ref Range  Status   Specimen Description BLOOD LEFT ANTECUBITAL  Final   Special Requests   Final    BOTTLES DRAWN AEROBIC AND ANAEROBIC Blood Culture adequate volume   Culture   Final    NO GROWTH 5 DAYS Performed at [facility], [address] [place], [place] [address]    Report Status 06/07/2019 FINAL  Final  Culture, blood (routine x 2)     Status: None   Collection Time: 05/16/2019  2:36 AM   Specimen: BLOOD  Result Value Ref Range Status   Specimen Description BLOOD RIGHT FOREARM  Final   Special Requests   Final    BOTTLES DRAWN AEROBIC AND ANAEROBIC Blood Culture adequate volume   Culture   Final    NO GROWTH 5 DAYS Performed at [facility], [address] [place], [place] [address]    Report Status 06/07/2019 FINAL  Final  Culture, blood (routine x 2)     Status: None   Collection Time: 05/16/2019  3:10 PM   Specimen: BLOOD  Result Value Ref Range Status   Specimen Description BLOOD A-LINE  Final   Special Requests   Final    BOTTLES DRAWN AEROBIC AND ANAEROBIC Blood Culture results may not be optimal due to an excessive volume of blood received in culture bottles   Culture   Final    NO GROWTH 5 DAYS Performed at [facility], [address] [place], [place] [address]    Report Status 06/07/2019 FINAL  Final  Culture, blood (routine x 2)     Status: None    Collection Time: 06/10/2019  3:10 PM   Specimen: BLOOD  Result Value Ref Range Status   Specimen Description BLOOD A-LINE  Final   Special Requests   Final    BOTTLES DRAWN AEROBIC AND ANAEROBIC Blood Culture results may not be optimal due to an excessive volume of blood received in culture bottles   Culture   Final    NO GROWTH 5 DAYS Performed at [facility], [address] [place], [place] [address]    Report Status 06/07/2019 FINAL  Final  MRSA PCR Screening     Status: None   Collection Time: 06/03/19  5:45 PM   Specimen: Nasopharyngeal  Result Value Ref Range Status   MRSA by PCR NEGATIVE NEGATIVE Final    Comment:        The GeneXpert MRSA Assay (FDA approved for NASAL specimens only), is one component of a comprehensive MRSA colonization surveillance program. It is not intended to diagnose MRSA infection nor to guide or monitor treatment for MRSA infections. Performed at [facility], [address], [place], [place] [address]   Urine Culture     Status: None   Collection Time: 06/03/19  6:20 PM   Specimen: Urine, Catheterized  Result Value Ref Range Status   Specimen Description   Final    URINE, CATHETERIZED Performed at [facility], [address], [place], [place] [address]    Special Requests   Final    Immunocompromised Performed at [facility], [address] [place],  [address]    Culture   Final    NO GROWTH Performed at [facility], [place] [address] [place],  [address]    Report Status 06/05/2019 FINAL  Final  Cath Tip Culture     Status: None   Collection Time: 06/12/2019  4:32 PM   Specimen: Catheter Tip; Other  Result Value  Ref Range Status   Specimen Description   Final    CATH TIP Performed at [facility], [address] [place], [place] [address]    Special Requests   Final    NONE Performed at [facility], [address] [place], [place] [address]     Culture   Final    NO GROWTH 2 DAYS Performed at [facility], [place] [address] [place], [place] [address]    Report Status 06/07/2019 FINAL  Final  Culture, respiratory (non-expectorated)     Status: None   Collection Time: 06/06/19 10:22 AM   Specimen: Tracheal Aspirate; Respiratory  Result Value Ref Range Status   Specimen Description   Final    TRACHEAL ASPIRATE Performed at [facility], [address], [place], [place] [address]    Special Requests   Final    NONE Performed at [facility], [address] [place], [place] [address]    Gram Stain   Final    RARE WBC PRESENT, PREDOMINANTLY PMN NO ORGANISMS SEEN Performed at [facility], [place] [address], [place],  [address]    Culture   Final    RARE CANDIDA ALBICANS RARE FUNGUS (MOLD) ISOLATED, PROBABLE CONTAMINANT/COLONIZER (SAPROPHYTE). CONTACT MICROBIOLOGY IF FURTHER IDENTIFICATION REQUIRED [phone].    Report Status 06/09/2019 FINAL  Final    Coagulation Studies: No results for input(s): LABPROT, INR in the last 72 hours.  Urinalysis: No results for input(s): COLORURINE, LABSPEC, PHURINE, GLUCOSEU, HGBUR, BILIRUBINUR, KETONESUR, PROTEINUR, UROBILINOGEN, NITRITE, LEUKOCYTESUR in the last 72 hours.  Invalid input(s): APPERANCEUR    Imaging: DG Chest Port 1 View  Result Date: 06/08/2019 CLINICAL DATA:  Intubation EXAM: PORTABLE CHEST 1 VIEW COMPARISON:  06/07/2019 at 5:41 a.m. FINDINGS: Support Apparatus: --Endotracheal tube: Tip at the level of the clavicular heads. --Enteric tube:Tip and sideport project over the stomach. --Catheter(s):None --Other: None Unchanged small left pleural effusion with basilar atelectasis. IMPRESSION: 1. Unchanged small left pleural effusion and basilar atelectasis. 2. Unchanged position of endotracheal tube. Electronically Signed   By: Ulyses Jarred M.D.   On: 06/08/2019 01:17     Medications:   . sodium chloride 5 mL/hr at 06/09/19 0600  .  amiodarone 30 mg/hr (06/09/19 1102)  . ampicillin-sulbactam (UNASYN) IV    . dexmedetomidine (PRECEDEX) IV infusion    . fentaNYL infusion INTRAVENOUS 259 mcg/hr (06/09/19 0854)  . norepinephrine (LEVOPHED) Adult infusion 3 mcg/min (06/06/19 0019)   .  stroke: mapping our early stages of recovery book   Does not apply Once  . aspirin  324 mg Per Tube Daily  . atorvastatin  80 mg Per Tube q morning - 10a  . B-complex with vitamin C  1 tablet Per Tube Daily  . chlorhexidine gluconate (MEDLINE KIT)  15 mL Mouth Rinse BID  . Chlorhexidine Gluconate Cloth  6 each Topical Q0600  . epoetin (EPOGEN/PROCRIT) injection  10,000 Units Intravenous Q T,Th,Sa-HD  . feeding supplement (PRO-STAT SUGAR FREE 64)  60 mL Per Tube TID  . feeding supplement (VITAL HIGH PROTEIN)  1,000 mL Per Tube Q24H  . insulin aspart  0-9 Units Subcutaneous Q4H  . ipratropium-albuterol  3 mL Nebulization TID  . mouth rinse  15 mL Mouth Rinse 10 times per day  . pantoprazole sodium  40 mg Per Tube Daily  . senna-docusate  1 tablet Per Tube BID  . sodium chloride flush  10-40 mL Intracatheter Q12H   acetaminophen **OR** acetaminophen, heparin, ipratropium-albuterol, midazolam,  ondansetron **OR** ondansetron (ZOFRAN) IV, sodium chloride flush, vecuronium  Assessment/ Plan:  Jake Gomez is a 48 y.o. Hispanic male with end stage renal disease, diabetes mellitus type I, hypertension, sleep apnea who was admitted to [facility] on 05/27/2019 for 06/07/2019  [facility] [address] RIJ 120kg  1. End stage renal disease on hemodialysis  Complication of dialysis device: permcath removed. Currently with temp HD catheter.  AVF maturing. Intermittent hemodialysis treatment today. Evaluate daily dialysis for need.   2. Hypotension: with sepsis and pneumonia - broad spectrum antibiotics: unasyn - weaned off norepinephrine .  3. Pneumonia: with acute respiratory failure requiring intubation and mechanical ventilation. Secondary to  pneumonia - Appreciate critical care input.   4. Anemia of chronic kidney disease: macrocytic. Hemoglobin 7.9 - EPO with HD treatment.   5. Secondary Hyperparathyroidism with hypocalcemia and hyperphosphatemia. Off calcium acetate   LOS: 7 Hang Ammon 12/26/202011:45 AM

## 2019-06-09 NOTE — Progress Notes (Signed)
   06/09/19 0930  Neurological  Orientation Level Intubated/Tracheostomy - Unable to assess  Respiratory  Respiratory Pattern Regular  Bilateral Breath Sounds Diminished  PT SEDATED VITALS STABLE FOR HD TX CVC WDL UFG 2L

## 2019-06-09 NOTE — Progress Notes (Signed)
   06/09/19 1308  Neurological  Orientation Level Intubated/Tracheostomy - Unable to assess  Respiratory  Respiratory Pattern Regular  Bilateral Breath Sounds Diminished;Clear  PT STABLE REMAINS SEDATED VITALS STable

## 2019-06-09 NOTE — Progress Notes (Signed)
CRITICAL CARE NOTE  48 yo Hispanic male with acute and severehypoxicresp failure from acute pneumonia with previous COVID-19 infection with acute CVA    CC  follow up respiratory failure  SUBJECTIVE Patient remains critically ill Prognosis is guarded Prognosis is poor Plan for TEE Will need TRACH for survival Post covid pneumonitis and CVA Family updated     BP (!) 118/54   Pulse 80   Temp 98.9 F (37.2 C) (Axillary)   Resp 15   Ht 5' 7.99" (1.727 m)   Wt 122.7 kg   SpO2 96%   BMI 41.14 kg/m    I/O last 3 completed shifts: In: 2907.7 [I.V.:1801.7; NG/GT:906; IV Piggyback:200] Out: 1500 [Stool:1500] No intake/output data recorded.  SpO2: 96 % O2 Flow Rate (L/min): 3 L/min FiO2 (%): 21 %   SIGNIFICANT EVENTS 12/21 Intubated, resp failure, acute CVA Perm cath removed, vasc cath LEFT groin, Triple lumen RT groin placed 12/22 on CRRT, septic shock, severe hypoxia, on vent, unable to wean due to hemodynamic instability 12/23 attemt neuro assessment today, follow up NEURO recs 12/24 remains on vent, try HD today, wean off CRRT 12/25 extubated for cuff leak and re-intubated  REVIEW OF SYSTEMS  PATIENT IS UNABLE TO PROVIDE COMPLETE REVIEW OF SYSTEMS DUE TO SEVERE CRITICAL ILLNESS   PHYSICAL EXAMINATION:  GENERAL:critically ill appearing, +resp distress HEAD: Normocephalic, atraumatic.  EYES: Pupils equal, round, reactive to light.  No scleral icterus.  MOUTH: Moist mucosal membrane. NECK: Supple.  PULMONARY: +rhonchi, +wheezing CARDIOVASCULAR: S1 and S2. Regular rate and rhythm. No murmurs, rubs, or gallops.  GASTROINTESTINAL: Soft, nontender, -distended.  Positive bowel sounds.  MUSCULOSKELETAL: No swelling, clubbing, or edema.  NEUROLOGIC: obtunded, GCS<8 SKIN:intact,warm,dry  MEDICATIONS: I have reviewed all medications and confirmed regimen as documented   CULTURE RESULTS   Recent Results (from the past 240 hour(s))  Respiratory Panel by RT PCR  (Flu A&B, Covid) - Nasopharyngeal Swab     Status: None   Collection Time: 06/03/2019  2:35 AM   Specimen: Nasopharyngeal Swab  Result Value Ref Range Status   SARS Coronavirus 2 by RT PCR NEGATIVE NEGATIVE Final    Comment: (NOTE) SARS-CoV-2 target nucleic acids are NOT DETECTED. The SARS-CoV-2 RNA is generally detectable in upper respiratoy specimens during the acute phase of infection. The lowest concentration of SARS-CoV-2 viral copies this assay can detect is 131 copies/mL. A negative result does not preclude SARS-Cov-2 infection and should not be used as the sole basis for treatment or other patient management decisions. A negative result may occur with  improper specimen collection/handling, submission of specimen other than nasopharyngeal swab, presence of viral mutation(s) within the areas targeted by this assay, and inadequate number of viral copies (<131 copies/mL). A negative result must be combined with clinical observations, patient history, and epidemiological information. The expected result is Negative. Fact Sheet for Patients:  PinkCheek.be Fact Sheet for Healthcare Providers:  GravelBags.it This test is not yet ap proved or cleared by the [place] FDA and  has been authorized for detection and/or diagnosis of SARS-CoV-2 by FDA under an Emergency Use Authorization (EUA). This EUA will remain  in effect (meaning this test can be used) for the duration of the COVID-19 declaration under Section 564(b)(1) of the Act, 21 U.S.C. section 360bbb-3(b)(1), unless the authorization is terminated or revoked sooner.    Influenza A by PCR NEGATIVE NEGATIVE Final   Influenza B by PCR NEGATIVE NEGATIVE Final    Comment: (NOTE) The Xpert Xpress SARS-CoV-2/FLU/RSV assay is  intended as an aid in  the diagnosis of influenza from Nasopharyngeal swab specimens and  should not be used as a sole basis for treatment. Nasal  washings and  aspirates are unacceptable for Xpert Xpress SARS-CoV-2/FLU/RSV  testing. Fact Sheet for Patients: PinkCheek.be Fact Sheet for Healthcare Providers: GravelBags.it This test is not yet approved or cleared by the [place] FDA and  has been authorized for detection and/or diagnosis of SARS-CoV-2 by  FDA under an Emergency Use Authorization (EUA). This EUA will remain  in effect (meaning this test can be used) for the duration of the  Covid-19 declaration under Section 564(b)(1) of the Act, 21  U.S.C. section 360bbb-3(b)(1), unless the authorization is  terminated or revoked. Performed at [facility], [address] [place], [place] [address]   Culture, blood (routine x 2)     Status: None   Collection Time: 06/13/2019  2:36 AM   Specimen: BLOOD  Result Value Ref Range Status   Specimen Description BLOOD LEFT ANTECUBITAL  Final   Special Requests   Final    BOTTLES DRAWN AEROBIC AND ANAEROBIC Blood Culture adequate volume   Culture   Final    NO GROWTH 5 DAYS Performed at [facility], [address], [place], [place] [address]    Report Status 06/07/2019 FINAL  Final  Culture, blood (routine x 2)     Status: None   Collection Time: 05/27/2019  2:36 AM   Specimen: BLOOD  Result Value Ref Range Status   Specimen Description BLOOD RIGHT FOREARM  Final   Special Requests   Final    BOTTLES DRAWN AEROBIC AND ANAEROBIC Blood Culture adequate volume   Culture   Final    NO GROWTH 5 DAYS Performed at [facility], [address] [place], [place] [address]    Report Status 06/07/2019 FINAL  Final  Culture, blood (routine x 2)     Status: None   Collection Time: 05/27/2019  3:10 PM   Specimen: BLOOD  Result Value Ref Range Status   Specimen Description BLOOD A-LINE  Final   Special Requests   Final    BOTTLES DRAWN AEROBIC AND ANAEROBIC Blood Culture results may not be optimal due  to an excessive volume of blood received in culture bottles   Culture   Final    NO GROWTH 5 DAYS Performed at [facility], [address] [place], [place] [address]    Report Status 06/07/2019 FINAL  Final  Culture, blood (routine x 2)     Status: None   Collection Time: 06/11/2019  3:10 PM   Specimen: BLOOD  Result Value Ref Range Status   Specimen Description BLOOD A-LINE  Final   Special Requests   Final    BOTTLES DRAWN AEROBIC AND ANAEROBIC Blood Culture results may not be optimal due to an excessive volume of blood received in culture bottles   Culture   Final    NO GROWTH 5 DAYS Performed at [facility], [address] [place], [place] [address]    Report Status 06/07/2019 FINAL  Final  MRSA PCR Screening     Status: None   Collection Time: 06/03/19  5:45 PM   Specimen: Nasopharyngeal  Result Value Ref Range Status   MRSA by PCR NEGATIVE NEGATIVE Final    Comment:        The GeneXpert MRSA Assay (FDA approved for NASAL specimens only), is one component of a comprehensive MRSA colonization surveillance program. It  is not intended to diagnose MRSA infection nor to guide or monitor treatment for MRSA infections. Performed at [facility], [address] [place], [place] [address]   Urine Culture     Status: None   Collection Time: 06/03/19  6:20 PM   Specimen: Urine, Catheterized  Result Value Ref Range Status   Specimen Description   Final    URINE, CATHETERIZED Performed at [facility], [address] [place], [place] [address]    Special Requests   Final    Immunocompromised Performed at [facility], [address] [place], [place] [address]    Culture   Final    NO GROWTH Performed at [facility], [place] [address], [place], [place] [address]    Report Status 06/05/2019 FINAL  Final  Cath Tip Culture     Status: None   Collection Time: 06/12/2019  4:32 PM   Specimen: Catheter Tip; Other  Result  Value Ref Range Status   Specimen Description   Final    CATH TIP Performed at [facility], [address], [place], [place] [address]    Special Requests   Final    NONE Performed at [facility], [address], [place], [place] [address]    Culture   Final    NO GROWTH 2 DAYS Performed at [facility], [place] [address] [place], [place] [address]    Report Status 06/07/2019 FINAL  Final  Culture, respiratory (non-expectorated)     Status: None (Preliminary result)   Collection Time: 06/06/19 10:22 AM   Specimen: Tracheal Aspirate; Respiratory  Result Value Ref Range Status   Specimen Description   Final    TRACHEAL ASPIRATE Performed at [facility], [address] [place], [place] [address]    Special Requests   Final    NONE Performed at [facility], [address] [place], [place] [address]    Gram Stain   Final    RARE WBC PRESENT, PREDOMINANTLY PMN NO ORGANISMS SEEN    Culture   Final    CULTURE REINCUBATED FOR BETTER GROWTH Performed at [facility], [place] [address] [place],  [address]    Report Status PENDING  Incomplete             Indwelling Urinary Catheter continued, requirement due to   Reason to continue Indwelling Urinary Catheter strict Intake/Output monitoring for hemodynamic instability   Central Line/ continued, requirement due to  Reason to continue [place] of central venous pressure or other hemodynamic parameters and poor IV access   Ventilator continued, requirement due to severe respiratory failure   Ventilator Sedation RASS 0 to -2      ASSESSMENT AND PLAN SYNOPSIS  Severe ACUTE Hypoxic and Hypercapnic Respiratory Failurefrom pneumonia and acute aspirtaion pneumonia with inability to protect airway due to CVA, septic shockwith end-stage renal disease on dialysis with morbid obesity Post Covid pneumonitis and pneumonia with hypercoagulable state with  acute CVA     Severe ACUTE Hypoxic and Hypercapnic Respiratory Failure -continue Full MV support -continue Bronchodilator Therapy -Wean Fio2 and PEEP as tolerated Will need trach for survival  KIDNEY INJURY/Renal Failure -follow chem 7 -follow UO -continue Foley Catheter-assess need -Avoid nephrotoxic agents HD as needed   NEUROLOGY Acute CVA c/w embolic phenominon - intubated and sedated - minimal sedation to achieve a RASS goal: -1 Follow up NEURO RECS   SHOCK-SEPSIS/HYPOVOLUMIC/CARDIOGENIC -use vasopressors to keep MAP>65 as  needed  CARDIAC ICU monitoring  ID -continue IV abx as prescibed -follow up cultures  GI GI PROPHYLAXIS as indicated  NUTRITIONAL STATUS DIET-->TF's as tolerated Constipation protocol as indicated  ENDO - will use ICU hypoglycemic\Hyperglycemia protocol if indicated   ELECTROLYTES -follow labs as needed -replace as needed -pharmacy consultation and following   DVT/GI PRX ordered TRANSFUSIONS AS NEEDED MONITOR FSBS ASSESS the need for LABS as needed   Critical Care Time devoted to patient care services described in this note is 35 minutes.   Overall, patient is critically ill, prognosis is guarded.  Patient with Multiorgan failure and at high risk for cardiac arrest and death.   multiple strokes patient may end up with tracheostomy and feeding tube Patient with post Covid pneumonitis pneumonia along with hypercoagulable state with acute bilateral infarcts   Corrin Parker, M.D.  [name]  Medical Director [place] Director [facility] Cardio-Pulmonary Department

## 2019-06-09 NOTE — Consult Note (Addendum)
PHARMACY NOTE:  ANTIMICROBIAL RENAL DOSAGE ADJUSTMENT  Current antimicrobial regimen includes a mismatch between antimicrobial dosage and estimated renal function.  As per policy approved by the Pharmacy & Therapeutics and Medical Executive Committees, the antimicrobial dosage will be adjusted accordingly.  Current antimicrobial dosage:  Unasyn 3g q12H   Indication: Aspiration PNA  Renal Function:  Estimated Creatinine Clearance: 16.1 mL/min (A) (by C-G formula based on SCr of 7.3 mg/dL (H)). [x]      On intermittent HD, scheduled: 12/26 [x]      Was on CRRT - taken off 12/24.     Antimicrobial dosage has been changed to:  Unasyn 3g q24H   Additional comments:   Thank you for allowing pharmacy to be a part of this patient's care.  Oswald Hillock, [facility] 06/09/2019 8:03 AM

## 2019-06-10 LAB — BASIC METABOLIC PANEL
Anion gap: 15 (ref 5–15)
BUN: 84 mg/dL — ABNORMAL HIGH (ref 6–20)
CO2: 23 mmol/L (ref 22–32)
Calcium: 7.4 mg/dL — ABNORMAL LOW (ref 8.9–10.3)
Chloride: 97 mmol/L — ABNORMAL LOW (ref 98–111)
Creatinine, Ser: 6.34 mg/dL — ABNORMAL HIGH (ref 0.61–1.24)
GFR calc Af Amer: 11 mL/min — ABNORMAL LOW (ref >60)
GFR calc non Af Amer: 10 mL/min — ABNORMAL LOW (ref >60)
Glucose, Bld: 216 mg/dL — ABNORMAL HIGH (ref 70–99)
Potassium: 4 mmol/L (ref 3.5–5.1)
Sodium: 135 mmol/L (ref 135–145)

## 2019-06-10 LAB — GLUCOSE, CAPILLARY
Glucose-Capillary: 227 mg/dL — ABNORMAL HIGH (ref 70–99)
Glucose-Capillary: 243 mg/dL — ABNORMAL HIGH (ref 70–99)
Glucose-Capillary: 208 mg/dL — ABNORMAL HIGH (ref 70–99)
Glucose-Capillary: 231 mg/dL — ABNORMAL HIGH (ref 70–99)
Glucose-Capillary: 214 mg/dL — ABNORMAL HIGH (ref 70–99)
Glucose-Capillary: 211 mg/dL — ABNORMAL HIGH (ref 70–99)
Glucose-Capillary: 194 mg/dL — ABNORMAL HIGH (ref 70–99)
Glucose-Capillary: 215 mg/dL — ABNORMAL HIGH (ref 70–99)

## 2019-06-10 LAB — CBC
HCT: 26.7 % — ABNORMAL LOW (ref 39.0–52.0)
Hemoglobin: 8.4 g/dL — ABNORMAL LOW (ref 13.0–17.0)
MCH: 32.8 pg (ref 26.0–34.0)
MCHC: 31.5 g/dL (ref 30.0–36.0)
MCV: 104.3 fL — ABNORMAL HIGH (ref 80.0–100.0)
Platelets: 191 10*3/uL (ref 150–400)
RBC: 2.56 MIL/uL — ABNORMAL LOW (ref 4.22–5.81)
RDW: 16.2 % — ABNORMAL HIGH (ref 11.5–15.5)
WBC: 10.1 10*3/uL (ref 4.0–10.5)
nRBC: 2.1 % — ABNORMAL HIGH (ref 0.0–0.2)

## 2019-06-10 LAB — MAGNESIUM: Magnesium: 2.5 mg/dL — ABNORMAL HIGH (ref 1.7–2.4)

## 2019-06-10 LAB — PHOSPHORUS: Phosphorus: 7.9 mg/dL — ABNORMAL HIGH (ref 2.5–4.6)

## 2019-06-10 MED ORDER — CHLORHEXIDINE GLUCONATE 0.12 % MT SOLN
OROMUCOSAL | Status: AC
  Filled 2019-06-10: qty 15

## 2019-06-10 NOTE — Progress Notes (Signed)
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 with inability to protect airway   Vent Mode: PCV FiO2 (%):  [21 %] 21 % Set Rate:  [16 bmp] 16 bmp Vt Set:  [500 mL] 500 mL PEEP:  [5 cmH20] 5 cmH20 Pressure Support:  [10 cmH20-18 cmH20] 10 cmH20 Plateau Pressure:  [16 cmH20-17 cmH20] 17 cmH20   BP (!) 117/50 (BP Location: Right Arm)   Pulse 82   Temp 98 F (36.7 C) (Oral)   Resp 15   Ht 5' 7.99" (1.727 m)   Wt 122.7 kg   SpO2 96%   BMI 41.14 kg/m    I/O last 3 completed shifts: In: 4040.8 [I.V.:2129.8; Other:85; [id number]; IV [id number] Out: 3137 [Other:2012; [id number] Total I/O In: 911 [I.V.:411; NG/GT:500] Out: -   SpO2: 96 % O2 Flow Rate (L/min): 3 L/min FiO2 (%): 21 %   SIGNIFICANT EVENTS 12/21 Intubated, resp failure, acute CVA Perm cath removed, vasc cath LEFT groin, Triple lumen RT groin placed 12/22 on CRRT, septic shock, severe hypoxia, on vent, unable to wean due to hemodynamic instability 12/23 attemt neuro assessment today, follow up NEURO recs 12/24 remains on vent, try HD today, wean off CRRT 12/25 extubated for cuff leak and re-intubated 12/26 failed weaning trials due to encephalopathy from CVA and pneumonia  REVIEW OF SYSTEMS  PATIENT IS UNABLE TO PROVIDE COMPLETE REVIEW OF SYSTEM S DUE TO SEVERE CRITICAL ILLNESS AND ENCEPHALOPATHY   PHYSICAL EXAMINATION:  GENERAL:critically ill appearing, +resp distress HEAD: Normocephalic, atraumatic.  EYES: Pupils equal, round, reactive to light.  No scleral icterus.  MOUTH: Moist mucosal  membrane. NECK: Supple. No thyromegaly. No nodules. No JVD.  PULMONARY: +rhonchi, +wheezing CARDIOVASCULAR: S1 and S2. Regular rate and rhythm. No murmurs, rubs, or gallops.  GASTROINTESTINAL: Soft, nontender, -distended. Positive bowel sounds.  MUSCULOSKELETAL: No swelling, clubbing, or edema.  NEUROLOGIC: obtunded SKIN:intact,warm,dry   MEDICATIONS: I have reviewed all medications and confirmed regimen as documented   CULTURE RESULTS   Recent Results (from the past 240 hour(s))  Respiratory Panel by RT PCR (Flu A&B, Covid) - Nasopharyngeal Swab     Status: None   Collection Time: 05/27/2019  2:35 AM   Specimen: Nasopharyngeal Swab  Result Value Ref Range Status   SARS Coronavirus 2 by RT PCR NEGATIVE NEGATIVE Final    Comment: (NOTE) SARS-CoV-2 target nucleic acids are NOT DETECTED. The SARS-CoV-2 RNA is generally detectable in upper respiratoy specimens during the acute phase of infection. The lowest concentration of SARS-CoV-2 viral copies this assay can detect is 131 copies/mL. A negative result does not preclude SARS-Cov-2 infection and should not be used as the sole basis for treatment or other patient management decisions. A negative result may occur with  improper specimen collection/handling, submission of specimen other than nasopharyngeal swab, presence of viral mutation(s) within the areas targeted by this assay, and inadequate number of viral copies (<131 copies/mL). A negative result must be combined with clinical observations, patient history, and epidemiological information. The expected result is Negative. Fact Sheet for Patients:  PinkCheek.be Fact Sheet for Healthcare Providers:  GravelBags.it This test is not  yet ap proved or cleared by the [place] and  has been authorized for detection and/or diagnosis of SARS-CoV-2 by FDA under an Emergency Use Authorization (EUA). This EUA will remain   in effect (meaning this test can be used) for the duration of the COVID-19 declaration under Section 564(b)(1) of the Act, 21 U.S.C. section 360bbb-3(b)(1), unless the authorization is terminated or revoked sooner.    Influenza A by PCR NEGATIVE NEGATIVE Final   Influenza B by PCR NEGATIVE NEGATIVE Final    Comment: (NOTE) The Xpert Xpress SARS-CoV-2/FLU/RSV assay is intended as an aid in  the diagnosis of influenza from Nasopharyngeal swab specimens and  should not be used as a sole basis for treatment. Nasal washings and  aspirates are unacceptable for Xpert Xpress SARS-CoV-2/FLU/RSV  testing. Fact Sheet for Patients: PinkCheek.be Fact Sheet for Healthcare Providers: GravelBags.it This test is not yet approved or cleared by the [place] FDA and  has been authorized for detection and/or diagnosis of SARS-CoV-2 by  FDA under an Emergency Use Authorization (EUA). This EUA will remain  in effect (meaning this test can be used) for the duration of the  Covid-19 declaration under Section 564(b)(1) of the Act, 21  U.S.C. section 360bbb-3(b)(1), unless the authorization is  terminated or revoked. Performed at [facility], [address] [place], [place] [address]   Culture, blood (routine x 2)     Status: None   Collection Time: 05/18/2019  2:36 AM   Specimen: BLOOD  Result Value Ref Range Status   Specimen Description BLOOD LEFT ANTECUBITAL  Final   Special Requests   Final    BOTTLES DRAWN AEROBIC AND ANAEROBIC Blood Culture adequate volume   Culture   Final    NO GROWTH 5 DAYS Performed at [facility], [address], [place], [place] [address]    Report Status 06/07/2019 FINAL  Final  Culture, blood (routine x 2)     Status: None   Collection Time: 05/25/2019  2:36 AM   Specimen: BLOOD  Result Value Ref Range Status   Specimen Description BLOOD RIGHT FOREARM  Final   Special Requests   Final     BOTTLES DRAWN AEROBIC AND ANAEROBIC Blood Culture adequate volume   Culture   Final    NO GROWTH 5 DAYS Performed at [facility], [address] [place], [place] [address]    Report Status 06/07/2019 FINAL  Final  Culture, blood (routine x 2)     Status: None   Collection Time: 05/25/2019  3:10 PM   Specimen: BLOOD  Result Value Ref Range Status   Specimen Description BLOOD A-LINE  Final   Special Requests   Final    BOTTLES DRAWN AEROBIC AND ANAEROBIC Blood Culture results may not be optimal due to an excessive volume of blood received in culture bottles   Culture   Final    NO GROWTH 5 DAYS Performed at [facility], [address] [place], [place] [address]    Report Status 06/07/2019 FINAL  Final  Culture, blood (routine x 2)     Status: None   Collection Time: 05/29/2019  3:10 PM   Specimen: BLOOD  Result Value Ref Range Status   Specimen Description BLOOD A-LINE  Final   Special Requests   Final    BOTTLES DRAWN AEROBIC AND ANAEROBIC Blood Culture results may not be optimal due to an excessive volume of blood received in culture bottles   Culture  Final    NO GROWTH 5 DAYS Performed at [facility], [address] [place], [place] [address]    Report Status 06/07/2019 FINAL  Final  MRSA PCR Screening     Status: None   Collection Time: 06/03/19  5:45 PM   Specimen: Nasopharyngeal  Result Value Ref Range Status   MRSA by PCR NEGATIVE NEGATIVE Final    Comment:        The GeneXpert MRSA Assay (FDA approved for NASAL specimens only), is one component of a comprehensive MRSA colonization surveillance program. It is not intended to diagnose MRSA infection nor to guide or monitor treatment for MRSA infections. Performed at [facility], [address] [place], [place] [address]   Urine Culture     Status: None   Collection Time: 06/03/19  6:20 PM   Specimen: Urine, Catheterized  Result Value Ref Range Status   Specimen  Description   Final    URINE, CATHETERIZED Performed at [facility], [address], [place], [place] [address]    Special Requests   Final    Immunocompromised Performed at [facility], [address], [place], [place] [address]    Culture   Final    NO GROWTH Performed at [facility], [place] [address] [place], [place] [address]    Report Status 06/05/2019 FINAL  Final  Cath Tip Culture     Status: None   Collection Time: 06/03/2019  4:32 PM   Specimen: Catheter Tip; Other  Result Value Ref Range Status   Specimen Description   Final    CATH TIP Performed at [facility], [address], [place], [place] [address]    Special Requests   Final    NONE Performed at [facility], [address], [place], [place] [address]    Culture   Final    NO GROWTH 2 DAYS Performed at [facility], [place] [address] [place], [place] [address]    Report Status 06/07/2019 FINAL  Final  Culture, respiratory (non-expectorated)     Status: None   Collection Time: 06/06/19 10:22 AM   Specimen: Tracheal Aspirate; Respiratory  Result Value Ref Range Status   Specimen Description   Final    TRACHEAL ASPIRATE Performed at [facility], [address], [place],  [address]    Special Requests   Final    NONE Performed at [facility], [address] [place],  [address]    Gram Stain   Final    RARE WBC PRESENT, PREDOMINANTLY PMN NO ORGANISMS SEEN Performed at [facility], [place] [address] [place],  [address]    Culture   Final    RARE CANDIDA ALBICANS RARE FUNGUS (MOLD) ISOLATED, PROBABLE CONTAMINANT/COLONIZER (SAPROPHYTE). CONTACT MICROBIOLOGY IF FURTHER IDENTIFICATION REQUIRED [phone].    Report Status 06/09/2019 FINAL  Final           Indwelling Urinary Catheter continued, requirement due to   Reason to continue Indwelling Urinary Catheter strict Intake/Output monitoring for  hemodynamic instability   Central Line/ continued, requirement due to  Reason to continue [place] of central venous pressure or other hemodynamic parameters and poor IV access   Ventilator continued, requirement due to severe respiratory failure   Ventilator Sedation RASS 0 to -2      ASSESSMENT AND PLAN SYNOPSIS  Severe ACUTE Hypoxic and Hypercapnic Respiratory Failurefrom pneumonia and acute aspirtaion pneumonia with inability to protect airway  due to CVA, septic shockwith end-stage renal disease on dialysis with morbid obesity Post Covid pneumonitis and pneumonia with hypercoagulable state with acute CVA   Severe ACUTE Hypoxic and Hypercapnic Respiratory Failure -continue Mechanical Ventilator support -continue Bronchodilator Therapy -Wean Fio2 and PEEP as tolerated -VAP/VENT bundle implementation Will need trach for survival   KIDNEY INJURY/Renal Failure -follow chem 7 -follow UO HD as needed Follow-up nephrology recs     NEUROLOGY Acute CVA with embolic phenomenon Patient currently intubated and sedated Follow-up neurology recs TEE planned this week Unable to protect airway patient will need a trach for survival  Shock-sepsis cardiogenic Use pressors as needed to keep MAP greater than 65  Cardiac Plan for TEE Continue ICU monitoring   INFECTIOUS DISEASE-post Covid infection pneumonitis with aspiration pneumonia and left lower lobe pneumonia -continue antibiotics as prescribed -follow up cultures   GI GI PROPHYLAXIS as indicated  NUTRITIONAL STATUS DIET-->TF's as tolerated Constipation protocol as indicated  ELECTROLYTES -follow labs as needed -replace as needed -pharmacy consultation and following    DVT/GI PRX ordered TRANSFUSIONS AS NEEDED MONITOR FSBS ASSESS the need for LABS as needed     Critical Care Time devoted to patient care services described in this note is 34  minutes.   Overall, patient is critically  ill, prognosis is guarded.  Patient with Multiorgan failure and at high risk for cardiac arrest and death.   multiple strokes patient will need tracheostomy and feeding tube Patient with post Covid pneumonitis pneumonia along with hypercoagulable state with acute bilateral infarcts  Poor prognosis  Maretta Bees [name], M.D.  [name]  Medical Director [place] Director [facility] Cardio-Pulmonary Department

## 2019-06-10 NOTE — Progress Notes (Signed)
Clinical status relayed to family  Updated and notified of patients medical condition-  Progressive multiorgan failure with very low chance of meaningful recovery.  Family understands the situation.  Family are satisfied with Plan of action and management. All questions answered  They have agreed to [facility] [name], M.D.  [name]  Medical Director [place] Director [facility] Cardio-Pulmonary Department

## 2019-06-10 NOTE — Progress Notes (Signed)
[facility]  ROUNDING NOTE   Subjective:   Hemodialysis treatment yesterday. Tolerated treatment well. UF of 2032m.   Amiodarone gtt   Objective:  Vital signs in last 24 hours:  Temp:  [98 F (36.7 C)-98.9 F (37.2 C)] 98 F (36.7 C) (12/27 0400) Pulse Rate:  [77-87] 85 (12/27 0814) Resp:  [9-20] 18 (12/27 0814) BP: (101-163)/(45-60) 115/49 (12/27 0700) SpO2:  [92 %-100 %] 98 % (12/27 0814) FiO2 (%):  [21 %] 21 % (12/27 0814)  Weight change:  Filed Weights   06/05/19 0448 06/06/19 0424 06/07/19 0248  Weight: 121.9 kg 123.7 kg 122.7 kg    Intake/Output: I/O last 3 completed shifts: In: 3643.6 [I.V.:1618.6; Other:85; NG/GT:1740; IV [id number]:701]Out: 2187 [Other:2012; Stool:175]   Intake/Output this shift:  No intake/output data recorded.  Physical Exam: General: Critically ill   Head: ETT   Eyes: Anicteric, PERRL  Neck:  trachea midline  Lungs:  PRVC FiO2 21%  Heart: regular  Abdomen:  Soft, nontender, obese  Extremities:  peripheral edema.  Neurologic: Intubated and sedated  Skin: No lesions  Access: Left femoral temp HD catheter 12/21 Dr. DLucky CowboyLeft Arm AVF Dr. DLucky Cowboy87/79/39   Basic Metabolic Panel: Recent Labs  Lab 05/18/2019 1744 06/08/2019 1744 06/05/19 0455 06/05/19 1018 06/05/19 1703 06/06/19 1022 06/06/19 1649 06/06/19 2246 06/07/19 0446 06/08/19 0417 06/09/19 0430 06/10/19 0540  NA 136  --  137 137 134* 133* 133* 134* 136 137 135 135  K 5.5*  --  4.5 4.1 3.8 3.7 3.5 3.4* 3.5 3.7 4.0 4.0  CL 95*  --  98 100 99 98 98 99 99 100 99 97*  CO2 21*  --  23 25 21* _0 20* 23  GLUCOSE 113*  --  91 79 116* 194* 198* 185* 188* 152* 181* 216*  BUN 100*  --  69* 55* 44* 36* 33* 32* 32* 62* 87* 84*  CREATININE 11.70*  --  7.31* 6.30* 5.07* 3.51* 3.14* 2.91* 2.73* 5.26* 7.30* 6.34*  CALCIUM 6.5*  --  7.7* 8.0* 8.0* 8.7* 8.6* 8.6* 8.5* 8.0* 7.4* 7.4*  MG  --    < > 2.1 1.9 1.9 1.9 2.0 1.9 2.1  --   --  2.5*  PHOS 8.1*  --  6.0*  5.0* 4.6  --   --   --   --   --   --  7.9*   < > = values in this interval not displayed.    Liver Function Tests: Recent Labs  Lab 06/13/2019 1744 06/05/19 0455 06/05/19 1018 06/05/19 1703 06/06/19 1022  AST  --   --   --   --  97*  ALT  --   --   --   --  59*  ALKPHOS  --   --   --   --  278*  BILITOT  --   --   --   --  3.2*  PROT  --   --   --   --  6.6  ALBUMIN 2.9* 2.8* 2.7* 2.6* 2.5*   No results for input(s): LIPASE, AMYLASE in the last 168 hours. No results for input(s): AMMONIA in the last 168 hours.  CBC: Recent Labs  Lab 06/07/19 0446 06/08/19 0417 06/09/19 0430 06/09/19 1352 06/10/19 0540  WBC 11.7* 8.2 8.7 8.4 10.1  NEUTROABS  --   --  4.5  --   --   HGB 8.3* 7.8* 7.9* 8.4* 8.4*  HCT  25.2* 24.1* 24.4* 26.4* 26.7*  MCV 98.1 99.6 99.2 102.7* 104.3*  PLT 106* 129* 140* 163 191    Cardiac Enzymes: No results for input(s): CKTOTAL, CKMB, CKMBINDEX, TROPONINI in the last 168 hours.  BNP: Invalid input(s): POCBNP  CBG: Recent Labs  Lab 06/09/19 1601 06/09/19 1953 06/09/19 2323 06/10/19 0355 06/10/19 0357  GLUCAP 202* 171* 192* 243* 33*    Microbiology: Results for orders placed or performed during the hospital encounter of 05/15/2019  Respiratory Panel by RT PCR (Flu A&B, Covid) - Nasopharyngeal Swab     Status: None   Collection Time: 06/11/2019  2:35 AM   Specimen: Nasopharyngeal Swab  Result Value Ref Range Status   SARS Coronavirus 2 by RT PCR NEGATIVE NEGATIVE Final    Comment: (NOTE) SARS-CoV-2 target nucleic acids are NOT DETECTED. The SARS-CoV-2 RNA is generally detectable in upper respiratoy specimens during the acute phase of infection. The lowest concentration of SARS-CoV-2 viral copies this assay can detect is 131 copies/mL. A negative result does not preclude SARS-Cov-2 infection and should not be used as the sole basis for treatment or other patient management decisions. A negative result may occur with  improper specimen  collection/handling, submission of specimen other than nasopharyngeal swab, presence of viral mutation(s) within the areas targeted by this assay, and inadequate number of viral copies (<131 copies/mL). A negative result must be combined with clinical observations, patient history, and epidemiological information. The expected result is Negative. Fact Sheet for Patients:  PinkCheek.be Fact Sheet for Healthcare Providers:  GravelBags.it This test is not yet ap proved or cleared by the [place] FDA and  has been authorized for detection and/or diagnosis of SARS-CoV-2 by FDA under an Emergency Use Authorization (EUA). This EUA will remain  in effect (meaning this test can be used) for the duration of the COVID-19 declaration under Section 564(b)(1) of the Act, 21 U.S.C. section 360bbb-3(b)(1), unless the authorization is terminated or revoked sooner.    Influenza A by PCR NEGATIVE NEGATIVE Final   Influenza B by PCR NEGATIVE NEGATIVE Final    Comment: (NOTE) The Xpert Xpress SARS-CoV-2/FLU/RSV assay is intended as an aid in  the diagnosis of influenza from Nasopharyngeal swab specimens and  should not be used as a sole basis for treatment. Nasal washings and  aspirates are unacceptable for Xpert Xpress SARS-CoV-2/FLU/RSV  testing. Fact Sheet for Patients: PinkCheek.be Fact Sheet for Healthcare Providers: GravelBags.it This test is not yet approved or cleared by the [place] FDA and  has been authorized for detection and/or diagnosis of SARS-CoV-2 by  FDA under an Emergency Use Authorization (EUA). This EUA will remain  in effect (meaning this test can be used) for the duration of the  Covid-19 declaration under Section 564(b)(1) of the Act, 21  U.S.C. section 360bbb-3(b)(1), unless the authorization is  terminated or revoked. Performed at [facility],  [address] [place], [place] [address]   Culture, blood (routine x 2)     Status: None   Collection Time: 05/16/2019  2:36 AM   Specimen: BLOOD  Result Value Ref Range Status   Specimen Description BLOOD LEFT ANTECUBITAL  Final   Special Requests   Final    BOTTLES DRAWN AEROBIC AND ANAEROBIC Blood Culture adequate volume   Culture   Final    NO GROWTH 5 DAYS Performed at [facility], [address], [place], [place] [address]    Report Status 06/07/2019 FINAL  Final  Culture, blood (routine x 2)  Status: None   Collection Time: 05/17/2019  2:36 AM   Specimen: BLOOD  Result Value Ref Range Status   Specimen Description BLOOD RIGHT FOREARM  Final   Special Requests   Final    BOTTLES DRAWN AEROBIC AND ANAEROBIC Blood Culture adequate volume   Culture   Final    NO GROWTH 5 DAYS Performed at [facility], [address] [place], [place] [address]    Report Status 06/07/2019 FINAL  Final  Culture, blood (routine x 2)     Status: None   Collection Time: 06/13/2019  3:10 PM   Specimen: BLOOD  Result Value Ref Range Status   Specimen Description BLOOD A-LINE  Final   Special Requests   Final    BOTTLES DRAWN AEROBIC AND ANAEROBIC Blood Culture results may not be optimal due to an excessive volume of blood received in culture bottles   Culture   Final    NO GROWTH 5 DAYS Performed at [facility] [address] [place], [place] [address]    Report Status 06/07/2019 FINAL  Final  Culture, blood (routine x 2)     Status: None   Collection Time: 05/24/2019  3:10 PM   Specimen: BLOOD  Result Value Ref Range Status   Specimen Description BLOOD A-LINE  Final   Special Requests   Final    BOTTLES DRAWN AEROBIC AND ANAEROBIC Blood Culture results may not be optimal due to an excessive volume of blood received in culture bottles   Culture   Final    NO GROWTH 5 DAYS Performed at [facility], [address] [place], [place] [address]     Report Status 06/07/2019 FINAL  Final  MRSA PCR Screening     Status: None   Collection Time: 06/03/19  5:45 PM   Specimen: Nasopharyngeal  Result Value Ref Range Status   MRSA by PCR NEGATIVE NEGATIVE Final    Comment:        The GeneXpert MRSA Assay (FDA approved for NASAL specimens only), is one component of a comprehensive MRSA colonization surveillance program. It is not intended to diagnose MRSA infection nor to guide or monitor treatment for MRSA infections. Performed at [facility], [address] [place], [place] [address]   Urine Culture     Status: None   Collection Time: 06/03/19  6:20 PM   Specimen: Urine, Catheterized  Result Value Ref Range Status   Specimen Description   Final    URINE, CATHETERIZED Performed at [facility], [address], [place], [place] [address]    Special Requests   Final    Immunocompromised Performed at [facility], [address], [place], [place] [address]    Culture   Final    NO GROWTH Performed at [facility], [place] [address], [place], [place] [address]    Report Status 06/05/2019 FINAL  Final  Cath Tip Culture     Status: None   Collection Time: 06/13/2019  4:32 PM   Specimen: Catheter Tip; Other  Result Value Ref Range Status   Specimen Description   Final    CATH TIP Performed at [facility], [address] [place], [place] [address]    Special Requests   Final    NONE Performed at [facility], [address], [place],  [address]    Culture   Final    NO GROWTH 2 DAYS Performed at [facility], [place]  [address], [place], [place] [address]    Report Status 06/07/2019 FINAL  Final  Culture, respiratory (non-expectorated)     Status: None   Collection Time: 06/06/19 10:22 AM   Specimen: Tracheal Aspirate; Respiratory  Result Value Ref Range Status   Specimen Description   Final    TRACHEAL ASPIRATE Performed at [facility], [address] [place], [place] [address]    Special Requests   Final    NONE Performed at [facility], [address] [place], [place] [address]    Gram Stain   Final    RARE WBC PRESENT, PREDOMINANTLY PMN NO ORGANISMS SEEN Performed at [facility], [place] [address] [place], [place] [address]    Culture   Final    RARE CANDIDA ALBICANS RARE FUNGUS (MOLD) ISOLATED, PROBABLE CONTAMINANT/COLONIZER (SAPROPHYTE). CONTACT MICROBIOLOGY IF FURTHER IDENTIFICATION REQUIRED [phone].    Report Status 06/09/2019 FINAL  Final    Coagulation Studies: No results for input(s): LABPROT, INR in the last 72 hours.  Urinalysis: No results for input(s): COLORURINE, LABSPEC, PHURINE, GLUCOSEU, HGBUR, BILIRUBINUR, KETONESUR, PROTEINUR, UROBILINOGEN, NITRITE, LEUKOCYTESUR in the last 72 hours.  Invalid input(s): APPERANCEUR    Imaging: No results found.   Medications:   . sodium chloride 5 mL/hr at 06/10/19 0600  . amiodarone 30 mg/hr (06/10/19 0600)  . ampicillin-sulbactam (UNASYN) IV Stopped (06/09/19 1750)  . dexmedetomidine (PRECEDEX) IV infusion    . fentaNYL infusion INTRAVENOUS 250 mcg/hr (06/10/19 0600)  . norepinephrine (LEVOPHED) Adult infusion 3 mcg/min (06/06/19 0019)   .  stroke: mapping our early stages of recovery book   Does not apply Once  . aspirin  324 mg Per Tube Daily  . atorvastatin  80 mg Per Tube q morning - 10a  . B-complex with vitamin C  1 tablet Per Tube Daily  . chlorhexidine gluconate (MEDLINE KIT)  15 mL Mouth Rinse BID  . Chlorhexidine Gluconate Cloth  6 each Topical Q0600  . epoetin (EPOGEN/PROCRIT) injection  10,000 Units Intravenous Q T,Th,Sa-HD  . feeding supplement (PRO-STAT SUGAR FREE 64)  60 mL Per Tube TID  . feeding supplement (VITAL HIGH PROTEIN)  1,000 mL Per Tube Q24H  . insulin aspart  0-9 Units Subcutaneous Q4H  . ipratropium-albuterol  3 mL Nebulization TID  . mouth rinse  15 mL Mouth Rinse 10 times per day  . pantoprazole  sodium  40 mg Per Tube Daily  . senna-docusate  1 tablet Per Tube BID  . sodium chloride flush  10-40 mL Intracatheter Q12H   acetaminophen **OR** acetaminophen, heparin, ipratropium-albuterol, midazolam, ondansetron **OR** ondansetron (ZOFRAN) IV, sodium chloride flush, vecuronium  Assessment/ Plan:  Mr. Octavio Matheney is a 48 y.o. Hispanic male with end stage renal disease, diabetes mellitus type I, hypertension, sleep apnea who was admitted to [facility] on 05/30/2019 for Hyperkalemia [E87.5] CVA (cerebral vascular accident) ([place]) [I63.9] Stroke ([place]) [I63.9] ESRD (end stage renal disease) on dialysis ([place]) [N18.6, Z99.2] Chest pain [R07.9]  [facility] MWF [address] RIJ 120kg  1. End stage renal disease on hemodialysis. Last dialysis treatment was 12/26. Requiring CRRT from 12/21 to 12/24 Next treatment for tomorrow and then resume MWF schedule Complication of dialysis device: permcath removed. Currently with temp HD catheter.  AVF maturing. - Evaluate daily dialysis for need.   2. Hypotension: with sepsis and pneumonia. With amiodarone gtt. Has been sinus rhythm last several days.  - broad spectrum antibiotics: unasyn - weaned off norepinephrine .  3. Pneumonia: with acute respiratory  failure requiring intubation and mechanical ventilation. Secondary to pneumonia - Appreciate critical care input.  - ENT to be consulted for possible tracheostomy  4. Anemia of chronic kidney disease: macrocytic. Hemoglobin 8.4 - EPO with HD treatment.   5. Secondary Hyperparathyroidism with hypocalcemia and hyperphosphatemia. Off calcium acetate   LOS: 8 Jake Gomez 12/27/20209:39 AM

## 2019-06-11 LAB — CBC WITH DIFFERENTIAL/PLATELET
Abs Immature Granulocytes: 0.85 10*3/uL — ABNORMAL HIGH (ref 0.00–0.07)
Basophils Absolute: 0.1 10*3/uL (ref 0.0–0.1)
Basophils Relative: 1 %
Eosinophils Absolute: 0.3 10*3/uL (ref 0.0–0.5)
Eosinophils Relative: 4 %
HCT: 25.2 % — ABNORMAL LOW (ref 39.0–52.0)
Hemoglobin: 7.9 g/dL — ABNORMAL LOW (ref 13.0–17.0)
Immature Granulocytes: 10 %
Lymphocytes Relative: 16 %
Lymphs Abs: 1.4 10*3/uL (ref 0.7–4.0)
MCH: 31.3 pg (ref 26.0–34.0)
MCHC: 31.3 g/dL (ref 30.0–36.0)
MCV: 100 fL (ref 80.0–100.0)
Monocytes Absolute: 1.3 10*3/uL — ABNORMAL HIGH (ref 0.1–1.0)
Monocytes Relative: 14 %
Neutro Abs: 5.1 10*3/uL (ref 1.7–7.7)
Neutrophils Relative %: 55 %
Platelets: 234 10*3/uL (ref 150–400)
RBC: 2.52 MIL/uL — ABNORMAL LOW (ref 4.22–5.81)
RDW: 16.3 % — ABNORMAL HIGH (ref 11.5–15.5)
Smear Review: NORMAL
WBC: 8.9 10*3/uL (ref 4.0–10.5)
nRBC: 0.8 % — ABNORMAL HIGH (ref 0.0–0.2)

## 2019-06-11 LAB — BASIC METABOLIC PANEL
Anion gap: 19 — ABNORMAL HIGH (ref 5–15)
BUN: 118 mg/dL — ABNORMAL HIGH (ref 6–20)
CO2: 21 mmol/L — ABNORMAL LOW (ref 22–32)
Calcium: 7.2 mg/dL — ABNORMAL LOW (ref 8.9–10.3)
Chloride: 96 mmol/L — ABNORMAL LOW (ref 98–111)
Creatinine, Ser: 8.32 mg/dL — ABNORMAL HIGH (ref 0.61–1.24)
GFR calc Af Amer: 8 mL/min — ABNORMAL LOW (ref >60)
GFR calc non Af Amer: 7 mL/min — ABNORMAL LOW (ref >60)
Glucose, Bld: 220 mg/dL — ABNORMAL HIGH (ref 70–99)
Potassium: 4.2 mmol/L (ref 3.5–5.1)
Sodium: 136 mmol/L (ref 135–145)

## 2019-06-11 LAB — GLUCOSE, CAPILLARY
Glucose-Capillary: 197 mg/dL — ABNORMAL HIGH (ref 70–99)
Glucose-Capillary: 201 mg/dL — ABNORMAL HIGH (ref 70–99)
Glucose-Capillary: 217 mg/dL — ABNORMAL HIGH (ref 70–99)
Glucose-Capillary: 218 mg/dL — ABNORMAL HIGH (ref 70–99)
Glucose-Capillary: 233 mg/dL — ABNORMAL HIGH (ref 70–99)
Glucose-Capillary: 252 mg/dL — ABNORMAL HIGH (ref 70–99)

## 2019-06-11 LAB — PROCALCITONIN: Procalcitonin: 20.21 ng/mL

## 2019-06-11 LAB — SAR COV2 SEROLOGY (COVID19)AB(IGG),IA: SARS-CoV-2 Ab, IgG: REACTIVE — AB

## 2019-06-11 MED ORDER — EPOETIN ALFA 10000 UNIT/ML IJ SOLN
10000.0000 [IU] | INTRAMUSCULAR | Status: DC
  Administered 2019-06-11 – 2019-06-22 (×5): 10000 [IU] via INTRAVENOUS
  Filled 2019-06-11: qty 1

## 2019-06-11 MED ORDER — SEVELAMER CARBONATE 0.8 G PO PACK
0.8000 g | PACK | Freq: Three times a day (TID) | ORAL | Status: DC
  Administered 2019-06-11 – 2019-06-20 (×26): 0.8 g via ORAL
  Filled 2019-06-11 (×29): qty 1

## 2019-06-11 MED ORDER — CALCITRIOL 1 MCG/ML PO SOLN
0.2500 ug | Freq: Every day | ORAL | Status: DC
  Administered 2019-06-12 – 2019-06-24 (×13): 0.25 ug
  Filled 2019-06-11 (×16): qty 0.25

## 2019-06-11 MED ORDER — CLOPIDOGREL BISULFATE 75 MG PO TABS
75.0000 mg | ORAL_TABLET | Freq: Every day | ORAL | Status: DC
  Administered 2019-06-11 – 2019-06-25 (×14): 75 mg
  Filled 2019-06-11 (×14): qty 1

## 2019-06-11 NOTE — Progress Notes (Signed)
[facility]  ROUNDING NOTE   Subjective:    patient is sedated on fentanyl with an left upward gaze  vent depended on 21 % FIO2 He has tube feed at 40 per hour on vital AF Rectal tube producing brown liquid stool    Objective:  Vital signs in last 24 hours:  Temp:  [98 F (36.7 C)-98.9 F (37.2 C)] 98.8 F (37.1 C) (12/28 0553) Pulse Rate:  [75-102] 84 (12/28 0601) Resp:  [13-19] 15 (12/28 0601) BP: (98-143)/(44-58) 114/46 (12/28 0601) SpO2:  [92 %-100 %] 95 % (12/28 0601) FiO2 (%):  [21 %] 21 % (12/28 0344)  Weight change:  Filed Weights   06/05/19 0448 06/06/19 0424 06/07/19 0248  Weight: 121.9 kg 123.7 kg 122.7 kg    Intake/Output: I/O last 3 completed shifts: In: 2516.1 [I.V.:1366.1; NG/GT:1150] Out: 800 [Stool:800]   Intake/Output this shift:  Total I/O In: 10 [I.V.:10] Out: -   Physical Exam: General: Critically ill   HEENT: ETT   Lungs:  PRVC FiO2 21%, coarse breath sounds at bases  Heart: Regular, 3/6 systolic murmur  Abdomen:  Soft, nontender, obese  Extremities:  peripheral edema.  Neurologic: Intubated and sedated  Skin: No lesions  Access: Left femoral temp HD catheter 12/21 Dr. Dew Left Arm AVF Dr. Dew 01/31/19    Basic Metabolic Panel: Recent Labs  Lab 05/30/2019 1744 05/19/2019 1744 06/05/19 0455 06/05/19 1018 06/05/19 1703 06/06/19 1022 06/06/19 1649 06/06/19 2246 06/07/19 0446 06/08/19 0417 06/09/19 0430 06/10/19 0540 06/11/19 0551  NA 136  --  137 137 134* 133* 133* 134* 136 137 135 135 136  K 5.5*  --  4.5 4.1 3.8 3.7 3.5 3.4* 3.5 3.7 4.0 4.0 4.2  CL 95*  --  98 100 99 98 98 99 99 100 99 97* 96*  CO2 21*  --  23 25 21* 23 25 27 23 22 20* 23 21*  GLUCOSE 113*  --  91 79 116* 194* 198* 185* 188* 152* 181* 216* 220*  BUN 100*  --  69* 55* 44* 36* 33* 32* 32* 62* 87* 84* 118*  CREATININE 11.70*  --  7.31* 6.30* 5.07* 3.51* 3.14* 2.91* 2.73* 5.26* 7.30* 6.34* 8.32*  CALCIUM 6.5*  --  7.7* 8.0* 8.0* 8.7* 8.6* 8.6* 8.5*  8.0* 7.4* 7.4* 7.2*  MG  --    < > 2.1 1.9 1.9 1.9 2.0 1.9 2.1  --   --  2.5*  --   PHOS 8.1*  --  6.0* 5.0* 4.6  --   --   --   --   --   --  7.9*  --    < > = values in this interval not displayed.    Liver Function Tests: Recent Labs  Lab 05/15/2019 1744 06/05/19 0455 06/05/19 1018 06/05/19 1703 06/06/19 1022  AST  --   --   --   --  97*  ALT  --   --   --   --  59*  ALKPHOS  --   --   --   --  278*  BILITOT  --   --   --   --  3.2*  PROT  --   --   --   --  6.6  ALBUMIN 2.9* 2.8* 2.7* 2.6* 2.5*   No results for input(s): LIPASE, AMYLASE in the last 168 hours. No results for input(s): AMMONIA in the last 168 hours.  CBC: Recent Labs  Lab 06/08/19 0417 06/09/19   0430 06/09/19 1352 06/10/19 0540 06/11/19 0551  WBC 8.2 8.7 8.4 10.1 8.9  NEUTROABS  --  4.5  --   --  5.1  HGB 7.8* 7.9* 8.4* 8.4* 7.9*  HCT 24.1* 24.4* 26.4* 26.7* 25.2*  MCV 99.6 99.2 102.7* 104.3* 100.0  PLT 129* 140* 163 191 234    Cardiac Enzymes: No results for input(s): CKTOTAL, CKMB, CKMBINDEX, TROPONINI in the last 168 hours.  BNP: Invalid input(s): POCBNP  CBG: Recent Labs  Lab 06/10/19 1636 06/10/19 1923 06/10/19 2324 06/11/19 0356 06/11/19 0723  GLUCAP 211* 194* 215* 233* 217*    Microbiology: Results for orders placed or performed during the hospital encounter of 05/28/2019  Respiratory Panel by RT PCR (Flu A&B, Covid) - Nasopharyngeal Swab     Status: None   Collection Time: 06/09/2019  2:35 AM   Specimen: Nasopharyngeal Swab  Result Value Ref Range Status   SARS Coronavirus 2 by RT PCR NEGATIVE NEGATIVE Final    Comment: (NOTE) SARS-CoV-2 target nucleic acids are NOT DETECTED. The SARS-CoV-2 RNA is generally detectable in upper respiratoy specimens during the acute phase of infection. The lowest concentration of SARS-CoV-2 viral copies this assay can detect is 131 copies/mL. A negative result does not preclude SARS-Cov-2 infection and should not be used as the sole basis for  treatment or other patient management decisions. A negative result may occur with  improper specimen collection/handling, submission of specimen other than nasopharyngeal swab, presence of viral mutation(s) within the areas targeted by this assay, and inadequate number of viral copies (<131 copies/mL). A negative result must be combined with clinical observations, patient history, and epidemiological information. The expected result is Negative. Fact Sheet for Patients:  https://www.fda.gov/media/142436/download Fact Sheet for Healthcare Providers:  https://www.fda.gov/media/142435/download This test is not yet ap proved or cleared by the [place] FDA and  has been authorized for detection and/or diagnosis of SARS-CoV-2 by FDA under an Emergency Use Authorization (EUA). This EUA will remain  in effect (meaning this test can be used) for the duration of the COVID-19 declaration under Section 564(b)(1) of the Act, 21 U.S.C. section 360bbb-3(b)(1), unless the authorization is terminated or revoked sooner.    Influenza A by PCR NEGATIVE NEGATIVE Final   Influenza B by PCR NEGATIVE NEGATIVE Final    Comment: (NOTE) The Xpert Xpress SARS-CoV-2/FLU/RSV assay is intended as an aid in  the diagnosis of influenza from Nasopharyngeal swab specimens and  should not be used as a sole basis for treatment. Nasal washings and  aspirates are unacceptable for Xpert Xpress SARS-CoV-2/FLU/RSV  testing. Fact Sheet for Patients: https://www.fda.gov/media/142436/download Fact Sheet for Healthcare Providers: https://www.fda.gov/media/142435/download This test is not yet approved or cleared by the [place] FDA and  has been authorized for detection and/or diagnosis of SARS-CoV-2 by  FDA under an Emergency Use Authorization (EUA). This EUA will remain  in effect (meaning this test can be used) for the duration of the  Covid-19 declaration under Section 564(b)(1) of the Act, 21  U.S.C. section  360bbb-3(b)(1), unless the authorization is  terminated or revoked. Performed at [facility], [address] [place], [place] [address]   Culture, blood (routine x 2)     Status: None   Collection Time: 05/18/2019  2:36 AM   Specimen: BLOOD  Result Value Ref Range Status   Specimen Description BLOOD LEFT ANTECUBITAL  Final   Special Requests   Final    BOTTLES DRAWN AEROBIC AND ANAEROBIC Blood Culture adequate volume   Culture     Final    NO GROWTH 5 DAYS Performed at [facility], [address] [place], [place] [address]    Report Status 06/07/2019 FINAL  Final  Culture, blood (routine x 2)     Status: None   Collection Time: 06/10/2019  2:36 AM   Specimen: BLOOD  Result Value Ref Range Status   Specimen Description BLOOD RIGHT FOREARM  Final   Special Requests   Final    BOTTLES DRAWN AEROBIC AND ANAEROBIC Blood Culture adequate volume   Culture   Final    NO GROWTH 5 DAYS Performed at [facility], [address] [place], [place] [address]    Report Status 06/07/2019 FINAL  Final  Culture, blood (routine x 2)     Status: None   Collection Time: 05/30/2019  3:10 PM   Specimen: BLOOD  Result Value Ref Range Status   Specimen Description BLOOD A-LINE  Final   Special Requests   Final    BOTTLES DRAWN AEROBIC AND ANAEROBIC Blood Culture results may not be optimal due to an excessive volume of blood received in culture bottles   Culture   Final    NO GROWTH 5 DAYS Performed at [facility], [address] [place], [place] [address]    Report Status 06/07/2019 FINAL  Final  Culture, blood (routine x 2)     Status: None   Collection Time: 05/31/2019  3:10 PM   Specimen: BLOOD  Result Value Ref Range Status   Specimen Description BLOOD A-LINE  Final   Special Requests   Final    BOTTLES DRAWN AEROBIC AND ANAEROBIC Blood Culture results may not be optimal due to an excessive volume of blood received in culture bottles   Culture   Final     NO GROWTH 5 DAYS Performed at [facility], [address] [place], [place] [address]    Report Status 06/07/2019 FINAL  Final  MRSA PCR Screening     Status: None   Collection Time: 06/03/19  5:45 PM   Specimen: Nasopharyngeal  Result Value Ref Range Status   MRSA by PCR NEGATIVE NEGATIVE Final    Comment:        The GeneXpert MRSA Assay (FDA approved for NASAL specimens only), is one component of a comprehensive MRSA colonization surveillance program. It is not intended to diagnose MRSA infection nor to guide or monitor treatment for MRSA infections. Performed at [facility], [address] [place], [place] [address]   Urine Culture     Status: None   Collection Time: 06/03/19  6:20 PM   Specimen: Urine, Catheterized  Result Value Ref Range Status   Specimen Description   Final    URINE, CATHETERIZED Performed at [facility], [address] [place], [place] [address]    Special Requests   Final    Immunocompromised Performed at [facility], [address] [place], [place] [address]    Culture   Final    NO GROWTH Performed at [facility], [address] [place], [place] [address]    Report Status 06/05/2019 FINAL  Final  Cath Tip Culture     Status: None   Collection Time: 05/22/2019  4:32 PM   Specimen: Catheter Tip; Other  Result Value Ref Range Status   Specimen Description   Final    CATH TIP Performed at [facility], [address] , [place] [address]    Special Requests     Final    NONE Performed at [facility], [address] [place], [place] [address]    Culture   Final    NO GROWTH 2 DAYS Performed at [facility], [place] [address] [place], [place] [address]    Report Status 06/07/2019 FINAL  Final  Culture, respiratory (non-expectorated)     Status: None   Collection Time: 06/06/19 10:22 AM   Specimen: Tracheal Aspirate; Respiratory  Result Value Ref Range  Status   Specimen Description   Final    TRACHEAL ASPIRATE Performed at [facility], [address], [place], [place] [address]    Special Requests   Final    NONE Performed at [facility], [address] [place], [place] [address]    Gram Stain   Final    RARE WBC PRESENT, PREDOMINANTLY PMN NO ORGANISMS SEEN Performed at [facility], [place] [address] [place], [place] [address]    Culture   Final    RARE CANDIDA ALBICANS RARE FUNGUS (MOLD) ISOLATED, PROBABLE CONTAMINANT/COLONIZER (SAPROPHYTE). CONTACT MICROBIOLOGY IF FURTHER IDENTIFICATION REQUIRED [phone].    Report Status 06/09/2019 FINAL  Final    Coagulation Studies: No results for input(s): LABPROT, INR in the last 72 hours.  Urinalysis: No results for input(s): COLORURINE, LABSPEC, PHURINE, GLUCOSEU, HGBUR, BILIRUBINUR, KETONESUR, PROTEINUR, UROBILINOGEN, NITRITE, LEUKOCYTESUR in the last 72 hours.  Invalid input(s): APPERANCEUR    Imaging: No results found.   Medications:   . sodium chloride 5 mL/hr at 06/11/19 0000  . ampicillin-sulbactam (UNASYN) IV 3 g (06/10/19 1705)  . dexmedetomidine (PRECEDEX) IV infusion    . fentaNYL infusion INTRAVENOUS 250 mcg/hr (06/11/19 0000)  . norepinephrine (LEVOPHED) Adult infusion 3 mcg/min (06/06/19 0019)   .  stroke: mapping our early stages of recovery book   Does not apply Once  . aspirin  324 mg Per Tube Daily  . atorvastatin  80 mg Per Tube q morning - 10a  . B-complex with vitamin C  1 tablet Per Tube Daily  . chlorhexidine gluconate (MEDLINE KIT)  15 mL Mouth Rinse BID  . Chlorhexidine Gluconate Cloth  6 each Topical Q0600  . epoetin (EPOGEN/PROCRIT) injection  10,000 Units Intravenous Q M,W,F-HD  . feeding supplement (PRO-STAT SUGAR FREE 64)  60 mL Per Tube TID  . feeding supplement (VITAL HIGH PROTEIN)  1,000 mL Per Tube Q24H  . insulin aspart  0-9 Units Subcutaneous Q4H  . ipratropium-albuterol  3 mL Nebulization TID  . mouth  rinse  15 mL Mouth Rinse 10 times per day  . pantoprazole sodium  40 mg Per Tube Daily  . senna-docusate  1 tablet Per Tube BID  . sodium chloride flush  10-40 mL Intracatheter Q12H   acetaminophen **OR** acetaminophen, heparin, ipratropium-albuterol, midazolam, ondansetron **OR** ondansetron (ZOFRAN) IV, sodium chloride flush, vecuronium  Assessment/ Plan:  [name] is a 48 y.o. Hispanic male with end stage renal disease, diabetes mellitus type I, hypertension, sleep apnea who was admitted to [facility] on 05/19/2019 for Hyperkalemia [E87.5] CVA (cerebral vascular accident) ([place]) [I63.9] Stroke ([place]) [I63.9] ESRD (end stage renal disease) on dialysis ([place]) [N18.6, Z99.2] Chest pain [R07.9]  [facility] M[facility] [address] RIJ 120kg  1. End stage renal disease on hemodialysis. Requiring CRRT from 12/21 to 12/24  resume MWF schedule Complication of dialysis device: permcath removed. Currently with temp HD catheter.  AVF maturing. Next HD scheduled for today   2. Pneumonia: with acute respiratory failure requiring intubation and mechanical ventilation.    -  Abx : Unasyn - dose adjustment as per pharmacy -   possible tracheostomy in  Near future  3. Anemia of chronic kidney disease: macrocytic.  Lab Results  Component Value Date   HGB 7.9 (L) 06/11/2019   - EPO with HD treatment.   4. Secondary Hyperparathyroidism with hypocalcemia and hyperphosphatemia.  Off calcium acetate Lab Results  Component Value Date   CALCIUM 7.2 (L) 06/11/2019   PHOS 7.9 (H) 06/10/2019  current tube feeds Vital AF Will add renvela powder   LOS: 9 Harmeet Singh 12/28/20209:39 AM  

## 2019-06-11 NOTE — Progress Notes (Signed)
CRITICAL CARE PROGRESS NOTE    Name: Jake Gomez MRN: [id number] DOB: May 16, 1971     LOS: 15   SUBJECTIVE FINDINGS & SIGNIFICANT EVENTS   Patient description:   48 yo male w/history of 2 diabetes mellitus, hypertension and obstructive sleep apnea not on CPAP, as well as end-stage renal disease on hemodialysis on Monday Wednesday and Friday. The patient has been having chills during his hemodialysis session on Monday and he apparently stopped halfway through most recent session. He then went to [place] and missed hemodialysis on Wednesday and Friday while on vacation. He came to the ER today with generalized malaise.  Lines / Drains: Trilysis on left femoral vv Triple lumen TLC on left femoral Right chest subclavian port  Cultures / Sepsis markers: Blood cultures X2 NTD Tracheal aspirate NTD   Antibiotics: Unasyn   Protocols / Consultants: Cardiology Vascular surgery  Nephrology  Tests / Events: CTA-12/20    IMPRESSION: 1. No central or lobar pulmonary embolism. Suboptimal opacification of the segmental pulmonary arteries due to contrast bolus timing and motion artifact. 2. Left lower lobe pneumonia.  Trace left pleural effusion. 3.  Aortic atherosclerosis (ICD10-I70.0).   MPRESSION: MRI HEAD IMPRESSION:  1. Motion degraded exam. 2. Patchy multifocal acute ischemic nonhemorrhagic infarcts involving the periventricular white matter of the corona radiata and centrum semi ovale bilaterally. No significant mass effect. 3. Otherwise grossly normal brain MRI for age.      PAST MEDICAL HISTORY   Past Medical History:  Diagnosis Date  . Diabetes mellitus without complication ([place])   . Hypertension   . Renal disorder   . Sleep apnea    can't afford CPAP     SURGICAL HISTORY    Past Surgical History:  Procedure Laterality Date  . AV FISTULA PLACEMENT Left 01/31/2019   Procedure: ARTERIOVENOUS (AV) FISTULA CREATION ( BRACHIOCEPHALIC );  Surgeon: Algernon Huxley, MD;  Location: [facility];  Service: Vascular;  Laterality: Left;  . DIALYSIS/PERMA CATHETER INSERTION N/A 07/19/2018   Procedure: DIALYSIS/PERMA CATHETER INSERTION;  Surgeon: Algernon Huxley, MD;  Location: [facility];  Service: Cardiovascular;  Laterality: N/A;  . DIALYSIS/PERMA CATHETER INSERTION N/A 09/13/2018   Procedure: DIALYSIS/PERMA CATHETER INSERTION;  Surgeon: Katha Cabal, MD;  Location: [facility];  Service: Cardiovascular;  Laterality: N/A;  . DIALYSIS/PERMA CATHETER REMOVAL N/A 05/22/2019   Procedure: DIALYSIS/PERMA CATHETER REMOVAL, temporary dialysis catheter placement;  Surgeon: Algernon Huxley, MD;  Location: [facility];  Service: Cardiovascular;  Laterality: N/A;  . peritonal dialysis    . REMOVAL OF A DIALYSIS CATHETER N/A 07/17/2018   Procedure: REMOVAL OF A DIALYSIS CATHETER;  Surgeon: Algernon Huxley, MD;  Location: [facility];  Service: Vascular;  Laterality: N/A;     FAMILY HISTORY   Family History  Problem Relation Age of Onset  . Thyroid disease Mother   . Stroke Father   . Cancer Father   . Diabetes Father      SOCIAL HISTORY   Social History   Tobacco Use  . Smoking status: Former Smoker    Quit date: 11/14/2009    Years since quitting: 9.5  . Smokeless tobacco: Former Systems developer    Quit date: 07/16/2009  Substance Use Topics  . Alcohol use: No  . Drug use: Not Currently    Comment: history 10 years ago   No urine production     MEDICATIONS   Current Medication:  Current Facility-Administered Medications:  .   stroke: mapping  our early stages of recovery book, , Does not apply, Once, Sharion Settler, NP .  0.9 %  sodium chloride infusion, 250 mL, Intravenous, Continuous, Wyvonnia Dusky, MD, Last Rate: 5 mL/hr at 06/11/19 0000, Rate Verify at  06/11/19 0000 .  acetaminophen (TYLENOL) tablet 650 mg, 650 mg, Per Tube, Q6H PRN **OR** acetaminophen (TYLENOL) suppository 650 mg, 650 mg, Rectal, Q6H PRN, Gerald Dexter, RPH .  [COMPLETED] amiodarone (NEXTERONE) 1.8 mg/mL load via infusion 150 mg, 150 mg, Intravenous, Once, 150 mg at 06/05/19 1111 **FOLLOWED BY** [EXPIRED] amiodarone (NEXTERONE PREMIX) 360-4.14 MG/200ML-% (1.8 mg/mL) IV infusion, 60 mg/hr, Intravenous, Continuous, Stopped at 06/05/19 2029 **FOLLOWED BY** amiodarone (NEXTERONE PREMIX) 360-4.14 MG/200ML-% (1.8 mg/mL) IV infusion, 30 mg/hr, Intravenous, Continuous, Kasa, Kurian, MD, Last Rate: 16.67 mL/hr at 06/11/19 0400, 30 mg/hr at 06/11/19 0400 .  Ampicillin-Sulbactam (UNASYN) 3 g in sodium chloride 0.9 % 100 mL IVPB, 3 g, Intravenous, Q24H, Oswald Hillock, RPH, Last Rate: 200 mL/hr at 06/10/19 1705, 3 g at 06/10/19 1705 .  aspirin chewable tablet 324 mg, 324 mg, Per Tube, Daily, Kasa, Kurian, MD, 324 mg at 06/10/19 1026 .  atorvastatin (LIPITOR) tablet 80 mg, 80 mg, Per Tube, q morning - 10a, Gerald Dexter, RPH, 80 mg at 06/10/19 1702 .  B-complex with vitamin C tablet 1 tablet, 1 tablet, Per Tube, Daily, Flora Lipps, MD, 1 tablet at 06/10/19 1026 .  chlorhexidine gluconate (MEDLINE KIT) (PERIDEX) 0.12 % solution 15 mL, 15 mL, Mouth Rinse, BID, Kasa, Kurian, MD, 15 mL at 06/10/19 2004 .  Chlorhexidine Gluconate Cloth 2 % PADS 6 each, 6 each, Topical, Q0600, Liana Gerold, MD, 6 each at 06/10/19 1308 .  dexmedetomidine (PRECEDEX) 400 MCG/100ML (4 mcg/mL) infusion, 0.4-1.2 mcg/kg/hr, Intravenous, Titrated, Blakeney, Dreama Saa, NP .  epoetin alfa (EPOGEN) injection 10,000 Units, 10,000 Units, Intravenous, Q T,Th,Sa-HD, Kolluru, Sarath, MD, 10,000 Units at 06/09/19 1237 .  feeding supplement (PRO-STAT SUGAR FREE 64) liquid 60 mL, 60 mL, Per Tube, TID, Flora Lipps, MD, 60 mL at 06/10/19 2151 .  feeding supplement (VITAL HIGH PROTEIN) liquid 1,000 mL, 1,000 mL, Per  Tube, Q24H, Kasa, Kurian, MD, 1,000 mL at 06/10/19 1130 .  fentaNYL 2537mg in NS 2541m(1093mml) infusion-PREMIX, 0-400 mcg/hr, Intravenous, Continuous, Kasa, Kurian, MD, Last Rate: 25 mL/hr at 06/11/19 0000, 250 mcg/hr at 06/11/19 0000 .  heparin injection 1,000-6,000 Units, 1,000-6,000 Units, CRRT, PRN, Kasa, Kurian, MD .  insulin aspart (novoLOG) injection 0-9 Units, 0-9 Units, Subcutaneous, Q4H, KeeDarel Hong NP, 3 Units at 06/11/19 0401 .  ipratropium-albuterol (DUONEB) 0.5-2.5 (3) MG/3ML nebulizer solution 3 mL, 3 mL, Nebulization, Q6H PRN, WilWyvonnia DuskyD .  ipratropium-albuterol (DUONEB) 0.5-2.5 (3) MG/3ML nebulizer solution 3 mL, 3 mL, Nebulization, TID, KasFlora LippsD, 3 mL at 06/10/19 2032 .  MEDLINE mouth rinse, 15 mL, Mouth Rinse, 10 times per day, KasFlora LippsD, 15 mL at 06/11/19 0519 .  midazolam (VERSED) injection 2 mg, 2 mg, Intravenous, Q4H PRN, KasFlora LippsD, 2 mg at 06/08/19 2040 .  norepinephrine (LEVOPHED) 16 mg in 250m26memix infusion, 0-40 mcg/min, Intravenous, Titrated, Kasa, Kurian, MD, Last Rate: 2.81 mL/hr at 06/06/19 0019, 3 mcg/min at 06/06/19 0019 .  ondansetron (ZOFRAN) tablet 4 mg, 4 mg, Per Tube, Q6H PRN **OR** ondansetron (ZOFRAN) injection 4 mg, 4 mg, Intravenous, Q6H PRN, MoraGerald DexterH .  pantoprazole sodium (PROTONIX) 40 mg/20 mL oral suspension 40 mg, 40 mg, Per Tube, Daily, KasaFlora Lipps, 40  mg at 06/10/19 1026 .  senna-docusate (Senokot-S) tablet 1 tablet, 1 tablet, Per Tube, BID, Flora Lipps, MD, 1 tablet at 06/10/19 2151 .  sodium chloride flush (NS) 0.9 % injection 10-40 mL, 10-40 mL, Intracatheter, Q12H, Kasa, Kurian, MD, 10 mL at 06/10/19 2151 .  sodium chloride flush (NS) 0.9 % injection 10-40 mL, 10-40 mL, Intracatheter, PRN, Flora Lipps, MD .  vecuronium (NORCURON) injection 10 mg, 10 mg, Intravenous, Q1H PRN, Flora Lipps, MD, 10 mg at 06/08/19 0045    ALLERGIES   Patient has no known  allergies.    REVIEW OF SYSTEMS     Unable to obtain as patient is on MV with sedation  PHYSICAL EXAMINATION   Vital Signs: Temp:  [98 F (36.7 C)-98.9 F (37.2 C)] 98.8 F (37.1 C) (12/28 0553) Pulse Rate:  [75-102] 84 (12/28 0601) Resp:  [13-19] 15 (12/28 0601) BP: (98-143)/(44-58) 114/46 (12/28 0601) SpO2:  [92 %-100 %] 95 % (12/28 0601) FiO2 (%):  [21 %] 21 % (12/28 0344)  GENERAL:chronicaly ill appearing HEAD: Normocephalic, atraumatic.  EYES: Pupils equal, round, reactive to light.  No scleral icterus.  MOUTH: Moist mucosal membrane. NECK: Supple. No thyromegaly. No nodules. No JVD.  PULMONARY: decreased bs at left base CARDIOVASCULAR: S1 and S2. Regular rate and rhythm. No murmurs, rubs, or gallops.  GASTROINTESTINAL: Soft, nontender, non-distended. No masses. Positive bowel sounds. No hepatosplenomegaly.  MUSCULOSKELETAL: No swelling, clubbing, or edema.  NEUROLOGIC: GCS4T SKIN:intact,warm,dry   PERTINENT DATA     Infusions: . sodium chloride 5 mL/hr at 06/11/19 0000  . amiodarone 30 mg/hr (06/11/19 0400)  . ampicillin-sulbactam (UNASYN) IV 3 g (06/10/19 1705)  . dexmedetomidine (PRECEDEX) IV infusion    . fentaNYL infusion INTRAVENOUS 250 mcg/hr (06/11/19 0000)  . norepinephrine (LEVOPHED) Adult infusion 3 mcg/min (06/06/19 0019)   Scheduled Medications: .  stroke: mapping our early stages of recovery book   Does not apply Once  . aspirin  324 mg Per Tube Daily  . atorvastatin  80 mg Per Tube q morning - 10a  . B-complex with vitamin C  1 tablet Per Tube Daily  . chlorhexidine gluconate (MEDLINE KIT)  15 mL Mouth Rinse BID  . Chlorhexidine Gluconate Cloth  6 each Topical Q0600  . epoetin (EPOGEN/PROCRIT) injection  10,000 Units Intravenous Q T,Th,Sa-HD  . feeding supplement (PRO-STAT SUGAR FREE 64)  60 mL Per Tube TID  . feeding supplement (VITAL HIGH PROTEIN)  1,000 mL Per Tube Q24H  . insulin aspart  0-9 Units Subcutaneous Q4H  .  ipratropium-albuterol  3 mL Nebulization TID  . mouth rinse  15 mL Mouth Rinse 10 times per day  . pantoprazole sodium  40 mg Per Tube Daily  . senna-docusate  1 tablet Per Tube BID  . sodium chloride flush  10-40 mL Intracatheter Q12H   PRN Medications: acetaminophen **OR** acetaminophen, heparin, ipratropium-albuterol, midazolam, ondansetron **OR** ondansetron (ZOFRAN) IV, sodium chloride flush, vecuronium Hemodynamic parameters:   Intake/Output: 12/27 0701 - 12/28 0700 In: 1416.2 [I.V.:846.2; NG/GT:570] Out: 650 [Stool:650]  Ventilator  Settings: Vent Mode: PCV FiO2 (%):  [21 %] 21 % Set Rate:  [16 bmp] 16 bmp PEEP:  [5 cmH20] 5 cmH20 Plateau Pressure:  [17 cmH20-18 cmH20] 17 cmH20     LAB RESULTS:  Basic Metabolic Panel: Recent Labs  Lab 06/06/2019 1744 05/25/2019 1744 06/05/19 0455 06/05/19 1018 06/05/19 1703 06/06/19 1022 06/06/19 1649 06/06/19 2246 06/07/19 0446 06/08/19 0417 06/09/19 0430 06/10/19 0540 06/11/19 0551  NA 136  --  137 137 134* 133* 133* 134* 136 137 135 135 136  K 5.5*  --  4.5 4.1 3.8 3.7 3.5 3.4* 3.5 3.7 4.0 4.0 4.2  CL 95*  --  98 100 99 98 98 99 99 100 99 97* 96*  CO2 21*  --  23 25 21* '23 25 27 23 22 '$ 20* 23 21*  GLUCOSE 113*  --  91 79 116* 194* 198* 185* 188* 152* 181* 216* 220*  BUN 100*  --  69* 55* 44* 36* 33* 32* 32* 62* 87* 84* 118*  CREATININE 11.70*  --  7.31* 6.30* 5.07* 3.51* 3.14* 2.91* 2.73* 5.26* 7.30* 6.34* 8.32*  CALCIUM 6.5*  --  7.7* 8.0* 8.0* 8.7* 8.6* 8.6* 8.5* 8.0* 7.4* 7.4* 7.2*  MG  --    < > 2.1 1.9 1.9 1.9 2.0 1.9 2.1  --   --  2.5*  --   PHOS 8.1*  --  6.0* 5.0* 4.6  --   --   --   --   --   --  7.9*  --    < > = values in this interval not displayed.   Liver Function Tests: Recent Labs  Lab 06/13/2019 1744 06/05/19 0455 06/05/19 1018 06/05/19 1703 06/06/19 1022  AST  --   --   --   --  97*  ALT  --   --   --   --  59*  ALKPHOS  --   --   --   --  278*  BILITOT  --   --   --   --  3.2*  PROT  --   --   --    --  6.6  ALBUMIN 2.9* 2.8* 2.7* 2.6* 2.5*   No results for input(s): LIPASE, AMYLASE in the last 168 hours. No results for input(s): AMMONIA in the last 168 hours. CBC: Recent Labs  Lab 06/08/19 0417 06/09/19 0430 06/09/19 1352 06/10/19 0540 06/11/19 0551  WBC 8.2 8.7 8.4 10.1 8.9  NEUTROABS  --  4.5  --   --  5.1  HGB 7.8* 7.9* 8.4* 8.4* 7.9*  HCT 24.1* 24.4* 26.4* 26.7* 25.2*  MCV 99.6 99.2 102.7* 104.3* 100.0  PLT 129* 140* 163 191 234   Cardiac Enzymes: No results for input(s): CKTOTAL, CKMB, CKMBINDEX, TROPONINI in the last 168 hours. BNP: Invalid input(s): POCBNP CBG: Recent Labs  Lab 06/10/19 1636 06/10/19 1923 06/10/19 2324 06/11/19 0356 06/11/19 0723  GLUCAP 211* 194* 215* 233* 217*     IMAGING RESULTS:     ASSESSMENT AND PLAN    -Multidisciplinary rounds held today  Acute Hypoxic Respiratory Failure -left lower lobe pneumonia as per CT chest - continue with Unasyn for possible aspiration.  - patient had previously been + for Psudomonas in 07/2018 on peritoneal catheter tip.  -continue Full MV support -continue Bronchodilator Therapy -Wean Fio2 and PEEP as tolerated -will perform SAT/SBT when respiratory parameters are met -plan for trache as per family     Acute ischemic CVA   - neurology on case - appreciate recommendations  - s/p brain imaging - MRA negative , MRI with patchy acute nonhemoragic ischemicCVA - ASA/Plavix recommended - platelets have been on lower end with trending up overnight, will need to monitor closely while patient on heparin.   - s/p EEG - no seizures, diffuse encephalopathy - carotid US - mild occulsion non clinically significant ICU telemonitoring    Renal Failure-ESRD   - nephrology on case appreciate input -  follow chem 7 -follow UO -continue Foley Catheter-assess need daily    ID -continue IV abx as prescibed -follow up cultures  GI/Nutrition GI PROPHYLAXIS as indicated DIET-->TF's as  tolerated Constipation protocol as indicated  ENDO - ICU hypoglycemic\Hyperglycemia protocol -check FSBS per protocol   ELECTROLYTES -follow labs as needed -replace as needed -pharmacy consultation   DVT/GI PRX ordered -SCDs  TRANSFUSIONS AS NEEDED MONITOR FSBS ASSESS the need for LABS as needed   Critical care provider statement:    Critical care time (minutes):  33   Critical care time was exclusive of:  Separately billable procedures and treating other patients   Critical care was necessary to treat or prevent imminent or life-threatening deterioration of the following conditions:  Acute CVA, left lower lobe pneumonia, circulatory shock , ESRD, multiple comorbid conditions.   Critical care was time spent personally by me on the following activities:  Development of treatment plan with patient or surrogate, discussions with consultants, evaluation of patient's response to treatment, examination of patient, obtaining history from patient or surrogate, ordering and performing treatments and interventions, ordering and review of laboratory studies and re-evaluation of patient's condition.  I assumed direction of critical care for this patient from another provider in my specialty: no    This document was prepared using Dragon voice recognition software and may include unintentional dictation errors.    Ottie Glazier, M.D.  Division of [place]

## 2019-06-11 NOTE — Progress Notes (Signed)
PRE HD   

## 2019-06-11 NOTE — Progress Notes (Signed)
HD ended 

## 2019-06-11 NOTE — Care Management (Signed)
Jake Gomez with [facility] (dialysis coordinator) updated on patient status. Current dialysis schedule- MWF [address]

## 2019-06-11 NOTE — Progress Notes (Signed)
HD started. 

## 2019-06-11 NOTE — Progress Notes (Signed)
Post HD  

## 2019-06-12 LAB — BASIC METABOLIC PANEL
Anion gap: 18 — ABNORMAL HIGH (ref 5–15)
BUN: 96 mg/dL — ABNORMAL HIGH (ref 6–20)
CO2: 26 mmol/L (ref 22–32)
Calcium: 7.6 mg/dL — ABNORMAL LOW (ref 8.9–10.3)
Chloride: 97 mmol/L — ABNORMAL LOW (ref 98–111)
Creatinine, Ser: 7.17 mg/dL — ABNORMAL HIGH (ref 0.61–1.24)
GFR calc Af Amer: 10 mL/min — ABNORMAL LOW (ref >60)
GFR calc non Af Amer: 8 mL/min — ABNORMAL LOW (ref >60)
Glucose, Bld: 217 mg/dL — ABNORMAL HIGH (ref 70–99)
Potassium: 3.8 mmol/L (ref 3.5–5.1)
Sodium: 141 mmol/L (ref 135–145)

## 2019-06-12 LAB — CBC WITH DIFFERENTIAL/PLATELET
Abs Immature Granulocytes: 0.45 10*3/uL — ABNORMAL HIGH (ref 0.00–0.07)
Basophils Absolute: 0 10*3/uL (ref 0.0–0.1)
Basophils Relative: 0 %
Eosinophils Absolute: 0.2 10*3/uL (ref 0.0–0.5)
Eosinophils Relative: 2 %
HCT: 28.6 % — ABNORMAL LOW (ref 39.0–52.0)
Hemoglobin: 8.8 g/dL — ABNORMAL LOW (ref 13.0–17.0)
Immature Granulocytes: 5 %
Lymphocytes Relative: 13 %
Lymphs Abs: 1.2 10*3/uL (ref 0.7–4.0)
MCH: 32.1 pg (ref 26.0–34.0)
MCHC: 30.8 g/dL (ref 30.0–36.0)
MCV: 104.4 fL — ABNORMAL HIGH (ref 80.0–100.0)
Monocytes Absolute: 1.3 10*3/uL — ABNORMAL HIGH (ref 0.1–1.0)
Monocytes Relative: 14 %
Neutro Abs: 6 10*3/uL (ref 1.7–7.7)
Neutrophils Relative %: 66 %
Platelets: 303 10*3/uL (ref 150–400)
RBC: 2.74 MIL/uL — ABNORMAL LOW (ref 4.22–5.81)
RDW: 16.7 % — ABNORMAL HIGH (ref 11.5–15.5)
WBC: 9.2 10*3/uL (ref 4.0–10.5)
nRBC: 0.4 % — ABNORMAL HIGH (ref 0.0–0.2)

## 2019-06-12 LAB — GLUCOSE, CAPILLARY
Glucose-Capillary: 190 mg/dL — ABNORMAL HIGH (ref 70–99)
Glucose-Capillary: 224 mg/dL — ABNORMAL HIGH (ref 70–99)
Glucose-Capillary: 230 mg/dL — ABNORMAL HIGH (ref 70–99)
Glucose-Capillary: 242 mg/dL — ABNORMAL HIGH (ref 70–99)
Glucose-Capillary: 267 mg/dL — ABNORMAL HIGH (ref 70–99)

## 2019-06-12 LAB — PROCALCITONIN: Procalcitonin: 15.81 ng/mL

## 2019-06-12 MED ORDER — MIDAZOLAM HCL 2 MG/2ML IJ SOLN
2.0000 mg | Freq: Once | INTRAMUSCULAR | Status: AC
  Administered 2019-06-12: 13:00:00 2 mg via INTRAVENOUS
  Filled 2019-06-12: qty 2

## 2019-06-12 MED ORDER — LABETALOL HCL 5 MG/ML IV SOLN
10.0000 mg | Freq: Four times a day (QID) | INTRAVENOUS | Status: DC | PRN
  Administered 2019-06-17 – 2019-06-23 (×3): 10 mg via INTRAVENOUS
  Filled 2019-06-12 (×5): qty 4

## 2019-06-12 NOTE — Progress Notes (Signed)
Patients sedation titrated off this am. Patient became diaphoretic and clammy. Elevated heart rate and respirations. Patient dyssynchrony with vent. Versed and vecronium given. Patient placed back on sedation. Dr Delilah Shan had family meeting. Plan to have meeting with neurology. Patient tolerating tube feeds. Rectal tube output 100 ml for shift. Continue to monitor.

## 2019-06-12 NOTE — Progress Notes (Signed)
Nutrition Follow-up  RD working remotely.  DOCUMENTATION CODES:   Morbid obesity  INTERVENTION:  Continue Vital High Protein at 40 mL/hr (960 mL goal daily volume) + Pro-Stat 60 mL TID per tube. Provides 1560 kcal, 174 grams of protein, 806 mL H2O daily.  Continue B-complex with C daily per tube.  Provide minimum free water flush of 20-30 mL Q4hrs to maintain tube patency.  NUTRITION DIAGNOSIS:   Inadequate oral intake related to inability to eat as evidenced by NPO status.  Ongoing - addressing with TF regimen.  GOAL:   Provide needs based on ASPEN/SCCM guidelines  Met with TF regimen.  MONITOR:   Vent status, Labs, Weight trends, TF tolerance, I & O's  REASON FOR ASSESSMENT:   Ventilator    ASSESSMENT:   48 year old male with PMHx of HTN, DM, sleep apnea, ESRD on HD, previous COVID-19 infection admitted with acute CVA, acute respiratory failure requiring intubation on 12/21.  Patient is currently intubated on ventilator support MV: 12.5 L/min Temp (24hrs), Avg:99.3 F (37.4 C), Min:98.7 F (37.1 C), Max:99.9 F (37.7 C)  Propofol: N/A  Medications reviewed and include: B-complex with C daily per tube, Epogen 10000 units during HD, Noolog 0-9 units Q4hrs, Protonix, senna-docusate 1 tablet BID, Renvela 0.8 grams TID, Precedex gtt, fentanyl gtt.  Labs reviewed: CBG 190-224, Chloride 97, BUN 96, Creatinine 7.17.  Enteral Access: OGT  Patient is off CRRT and has resumed M/W/F HD schedule. Plan is for possible tracheostomy tube per note on 12/27.  Diet Order:   Diet Order    None     EDUCATION NEEDS:   No education needs have been identified at this time  Skin:  Skin Assessment: Skin Integrity Issues:(stg II sacrum; unstageable back; stg II back)  Last BM:  06/12/2019 - medium type 7 per rectal tube  Height:   Ht Readings from Last 1 Encounters:  06/06/19 5' 7.99" (1.727 m)   Weight:   Wt Readings from Last 1 Encounters:  06/07/19 122.7 kg    Ideal Body Weight:  70 kg  BMI:  Body mass index is 41.14 kg/m.  Estimated Nutritional Needs:   Kcal:  1320-1680 (11-14 kcal/kg)  Protein:  175 grams (2.5 grams/kg IBW)  Fluid:  UOP + 1 L  Jacklynn Barnacle, MS, RD, LDN Office: [phone] Pager: [phone] After Hours/Weekend Pager: [phone]

## 2019-06-12 NOTE — Progress Notes (Signed)
Dr Delilah Shan at patients bedside. He is diaphoretic, clammy, increased heart rate, and respiration. Blood pressure elevated as well. MD spoke with family, there will be a family meeting. At this time no additional orders.

## 2019-06-12 NOTE — Progress Notes (Addendum)
CRITICAL CARE PROGRESS NOTE    Name: Jake Gomez MRN: [id number] DOB: [birth year]     LOS: 31   SUBJECTIVE FINDINGS & SIGNIFICANT EVENTS   Patient description:  Jake Gomez is a 48 yo male with a history of T2DM, HTN, OSA, recent COVID-19 infection and ESRD on dialysis M/W/F.  He presented to the ED on 12/19 for generalized malaise after missing dialysis on Wednesday and Friday for a trip to [place].  He then had a CVA.    Currently experiencing acute hypoxic respiratory failure.     Lines / Drains: Hemodialysis catheter on Right IJ Hemodialysis catheter left femoral NG tube 8 mm ET tube airway CVC triple lumen  Cultures / Sepsis markers: 12/28: Tracheal aspirate demonstrates moderate WBC with PMNs and few yeast.  Reincubated for better growth.    Antibiotics: Unasyn 3 g at 200 mL/hr   Protocols / Consultants: Neurology Cardiology Nephrology  Tests / Events: 12/19: presented to the ED for malaise after missing 2 dialysis sessions.  Potassium 6.4, Creatinine 15.4.  Acute ischemic CVA, with negative MR angiogram.   12/20: CT angio chest demonstrates consolidation in the left lower lobe.  No evidence of PE.     12/29 - had 54 minute family conference in person with patients mother, both daughters and  Sister. There is plan for additional meeting with neurology tommorow.    PAST MEDICAL HISTORY   Past Medical History:  Diagnosis Date  . Diabetes mellitus without complication ([place])   . Hypertension   . Renal disorder   . Sleep apnea    can't afford CPAP     SURGICAL HISTORY   Past Surgical History:  Procedure Laterality Date  . AV FISTULA PLACEMENT Left 01/31/2019   Procedure: ARTERIOVENOUS (AV) FISTULA CREATION ( BRACHIOCEPHALIC );  Surgeon: Algernon Huxley, MD;  Location: [facility];  Service:  Vascular;  Laterality: Left;  . DIALYSIS/PERMA CATHETER INSERTION N/A 07/19/2018   Procedure: DIALYSIS/PERMA CATHETER INSERTION;  Surgeon: Algernon Huxley, MD;  Location: [facility];  Service: Cardiovascular;  Laterality: N/A;  . DIALYSIS/PERMA CATHETER INSERTION N/A 09/13/2018   Procedure: DIALYSIS/PERMA CATHETER INSERTION;  Surgeon: Katha Cabal, MD;  Location: [facility];  Service: Cardiovascular;  Laterality: N/A;  . DIALYSIS/PERMA CATHETER REMOVAL N/A 05/18/2019   Procedure: DIALYSIS/PERMA CATHETER REMOVAL, temporary dialysis catheter placement;  Surgeon: Algernon Huxley, MD;  Location: [facility];  Service: Cardiovascular;  Laterality: N/A;  . peritonal dialysis    . REMOVAL OF A DIALYSIS CATHETER N/A 07/17/2018   Procedure: REMOVAL OF A DIALYSIS CATHETER;  Surgeon: Algernon Huxley, MD;  Location: [facility];  Service: Vascular;  Laterality: N/A;     FAMILY HISTORY   Family History  Problem Relation Age of Onset  . Thyroid disease Mother   . Stroke Father   . Cancer Father   . Diabetes Father      SOCIAL HISTORY   Social History   Tobacco Use  . Smoking status: Former Smoker    Quit date: 11/14/2009    Years since quitting: 9.5  . Smokeless tobacco: Former Systems developer    Quit date: 07/16/2009  Substance Use Topics  . Alcohol use: No  . Drug use: Not Currently    Comment: history 10 years ago   No urine production     MEDICATIONS   Current Medication:  Current Facility-Administered Medications:  .   stroke: mapping our early stages of recovery book, , Does not apply,  Once, Sharion Settler, NP .  0.9 %  sodium chloride infusion, 250 mL, Intravenous, Continuous, Wyvonnia Dusky, MD, Last Rate: 5 mL/hr at 06/11/19 0000, Rate Verify at 06/11/19 0000 .  acetaminophen (TYLENOL) tablet 650 mg, 650 mg, Per Tube, Q6H PRN **OR** acetaminophen (TYLENOL) suppository 650 mg, 650 mg, Rectal, Q6H PRN, Gerald Dexter, RPH .  aspirin chewable tablet 324 mg, 324  mg, Per Tube, Daily, Flora Lipps, MD, 324 mg at 06/11/19 0826 .  atorvastatin (LIPITOR) tablet 80 mg, 80 mg, Per Tube, q morning - 10a, Gerald Dexter, RPH, 80 mg at 06/11/19 1712 .  B-complex with vitamin C tablet 1 tablet, 1 tablet, Per Tube, Daily, Flora Lipps, MD, 1 tablet at 06/11/19 0825 .  calcitRIOL (ROCALTROL) 1 MCG/ML solution 0.25 mcg, 0.25 mcg, Per Tube, Daily, Singh, Harmeet, MD .  chlorhexidine gluconate (MEDLINE KIT) (PERIDEX) 0.12 % solution 15 mL, 15 mL, Mouth Rinse, BID, Kasa, Kurian, MD, 15 mL at 06/12/19 0821 .  Chlorhexidine Gluconate Cloth 2 % PADS 6 each, 6 each, Topical, Q0600, Liana Gerold, MD, 6 each at 06/11/19 [phone] .  clopidogrel (PLAVIX) tablet 75 mg, 75 mg, Per Tube, Daily, Ottie Glazier, MD, 75 mg at 06/11/19 1702 .  dexmedetomidine (PRECEDEX) 400 MCG/100ML (4 mcg/mL) infusion, 0.4-1.2 mcg/kg/hr, Intravenous, Titrated, Blakeney, Dana G, NP .  epoetin alfa (EPOGEN) injection 10,000 Units, 10,000 Units, Intravenous, Q M,W,F-HD, Murlean Iba, MD, 10,000 Units at 06/11/19 1413 .  feeding supplement (PRO-STAT SUGAR FREE 64) liquid 60 mL, 60 mL, Per Tube, TID, Flora Lipps, MD, 60 mL at 06/11/19 2142 .  feeding supplement (VITAL HIGH PROTEIN) liquid 1,000 mL, 1,000 mL, Per Tube, Q24H, Kasa, Kurian, MD, 1,000 mL at 06/11/19 2141 .  fentaNYL 2576mg in NS 2574m(1023mml) infusion-PREMIX, 0-400 mcg/hr, Intravenous, Continuous, Kasa, Kurian, MD, Last Rate: 20 mL/hr at 06/12/19 0600, 200 mcg/hr at 06/12/19 0600 .  heparin injection 1,000-6,000 Units, 1,000-6,000 Units, CRRT, PRN, Kasa, Kurian, MD .  insulin aspart (novoLOG) injection 0-9 Units, 0-9 Units, Subcutaneous, Q4H, KeeDarel Hong NP, 3 Units at 06/12/19 0820 .  ipratropium-albuterol (DUONEB) 0.5-2.5 (3) MG/3ML nebulizer solution 3 mL, 3 mL, Nebulization, Q6H PRN, WilWyvonnia DuskyD .  ipratropium-albuterol (DUONEB) 0.5-2.5 (3) MG/3ML nebulizer solution 3 mL, 3 mL, Nebulization, TID, KasFlora LippsD, 3 mL at 06/12/19 0746 .  MEDLINE mouth rinse, 15 mL, Mouth Rinse, 10 times per day, KasFlora LippsD, 15 mL at 06/12/19 0610 .  midazolam (VERSED) injection 2 mg, 2 mg, Intravenous, Q4H PRN, KasFlora LippsD, 2 mg at 06/12/19 0530 .  norepinephrine (LEVOPHED) 16 mg in 250m65memix infusion, 0-40 mcg/min, Intravenous, Titrated, Kasa, Kurian, MD, Last Rate: 2.81 mL/hr at 06/06/19 0019, 3 mcg/min at 06/06/19 0019 .  ondansetron (ZOFRAN) tablet 4 mg, 4 mg, Per Tube, Q6H PRN **OR** ondansetron (ZOFRAN) injection 4 mg, 4 mg, Intravenous, Q6H PRN, MoraGerald DexterH .  pantoprazole sodium (PROTONIX) 40 mg/20 mL oral suspension 40 mg, 40 mg, Per Tube, Daily, Kasa, Kurian, MD, 40 mg at 06/11/19 0825 .  senna-docusate (Senokot-S) tablet 1 tablet, 1 tablet, Per Tube, BID, KasaFlora Lipps, 1 tablet at 06/11/19 2137 .  sevelamer carbonate (RENVELA) packet 0.8 g, 0.8 g, Oral, TID WC, SingCandiss Norsermeet, MD, 0.8 g at 06/12/19 0820 .  sodium chloride flush (NS) 0.9 % injection 10-40 mL, 10-40 mL, Intracatheter, Q12H, Kasa, Kurian, MD, 10 mL at 06/11/19 2140 .  sodium chloride flush (NS) 0.9 % injection 10-40  mL, 10-40 mL, Intracatheter, PRN, Flora Lipps, MD .  vecuronium (NORCURON) injection 10 mg, 10 mg, Intravenous, Q1H PRN, Flora Lipps, MD, 10 mg at 06/08/19 0045    ALLERGIES   Patient has no known allergies.    REVIEW OF SYSTEMS   Unable to complete due to severity of patient illness  PHYSICAL EXAMINATION   Vital Signs: Temp:  [98.7 F (37.1 C)-99.9 F (37.7 C)] 98.7 F (37.1 C) (12/29 0700) Pulse Rate:  [81-98] 87 (12/29 0700) Resp:  [9-18] 9 (12/29 0700) BP: (98-153)/(42-65) 111/54 (12/29 0700) SpO2:  [89 %-100 %] 97 % (12/29 0746) FiO2 (%):  [21 %] 21 % (12/29 0746)  GENERAL: Patient sedated, intubated and on vent.  He opens eyes in response to verbal stimulation.   HEAD: Normocephalic, atraumatic.  EYES: Pupils equal, round, reactive to light.  Eyes deviated to up and  to the left.  No scleral icterus.  MOUTH: Moist mucosal membrane. NECK: Supple. No thyromegaly. No nodules. No JVD.  PULMONARY: Decreased breath sounds in Left lower lobe.  Patient exhibiting episodes of tachypnea overriding the vent.     CARDIOVASCULAR: S1 and S2. Regular rate and rhythm. No murmurs, rubs, or gallops.  GASTROINTESTINAL: Soft, nontender, non-distended. No masses. Positive bowel sounds. No hepatosplenomegaly.  MUSCULOSKELETAL: No swelling, clubbing, or edema.  NEUROLOGIC: Mild distress due to acute illness. Non purposeful movement of bilateral arms, minimal nonpurposeful movements of bilateral legs.  Does not respond to commands.    SKIN:intact,warm,diaphoretic   PERTINENT DATA     Infusions: . sodium chloride 5 mL/hr at 06/11/19 0000  . dexmedetomidine (PRECEDEX) IV infusion    . fentaNYL infusion INTRAVENOUS 200 mcg/hr (06/12/19 0600)  . norepinephrine (LEVOPHED) Adult infusion 3 mcg/min (06/06/19 0019)   Scheduled Medications: .  stroke: mapping our early stages of recovery book   Does not apply Once  . aspirin  324 mg Per Tube Daily  . atorvastatin  80 mg Per Tube q morning - 10a  . B-complex with vitamin C  1 tablet Per Tube Daily  . calcitRIOL  0.25 mcg Per Tube Daily  . chlorhexidine gluconate (MEDLINE KIT)  15 mL Mouth Rinse BID  . Chlorhexidine Gluconate Cloth  6 each Topical Q0600  . clopidogrel  75 mg Per Tube Daily  . epoetin (EPOGEN/PROCRIT) injection  10,000 Units Intravenous Q M,W,F-HD  . feeding supplement (PRO-STAT SUGAR FREE 64)  60 mL Per Tube TID  . feeding supplement (VITAL HIGH PROTEIN)  1,000 mL Per Tube Q24H  . insulin aspart  0-9 Units Subcutaneous Q4H  . ipratropium-albuterol  3 mL Nebulization TID  . mouth rinse  15 mL Mouth Rinse 10 times per day  . pantoprazole sodium  40 mg Per Tube Daily  . senna-docusate  1 tablet Per Tube BID  . sevelamer carbonate  0.8 g Oral TID WC  . sodium chloride flush  10-40 mL Intracatheter Q12H   PRN  Medications: acetaminophen **OR** acetaminophen, heparin, ipratropium-albuterol, midazolam, ondansetron **OR** ondansetron (ZOFRAN) IV, sodium chloride flush, vecuronium Hemodynamic parameters:   Intake/Output: 12/28 0701 - 12/29 0700 In: 2312.3 [I.V.:672.3; NG/GT:1640] Out: 1800 [Stool:800]  Ventilator  Settings: Vent Mode: PCV FiO2 (%):  [21 %] 21 % Set Rate:  [16 bmp] 16 bmp PEEP:  [5 cmH20] 5 cmH20 Plateau Pressure:  [22 cmH20] 22 cmH20   Other Labs:  LAB RESULTS:  Basic Metabolic Panel: Recent Labs  Lab 06/05/19 1018 06/05/19 1703 06/06/19 1022 06/06/19 1649 06/06/19 2246 06/07/19 0446 06/08/19 0417 06/09/19  0430 06/10/19 0540 06/11/19 0551 06/12/19 0513  NA 137 134* 133* 133* 134* 136 137 135 135 136 141  K 4.1 3.8 3.7 3.5 3.4* 3.5 3.7 4.0 4.0 4.2 3.8  CL 100 99 98 98 99 99 100 99 97* 96* 97*  CO2 25 21* '23 25 27 23 22 '$ 20* 23 21* 26  GLUCOSE 79 116* 194* 198* 185* 188* 152* 181* 216* 220* 217*  BUN 55* 44* 36* 33* 32* 32* 62* 87* 84* 118* 96*  CREATININE 6.30* 5.07* 3.51* 3.14* 2.91* 2.73* 5.26* 7.30* 6.34* 8.32* 7.17*  CALCIUM 8.0* 8.0* 8.7* 8.6* 8.6* 8.5* 8.0* 7.4* 7.4* 7.2* 7.6*  MG 1.9 1.9 1.9 2.0 1.9 2.1  --   --  2.5*  --   --   PHOS 5.0* 4.6  --   --   --   --   --   --  7.9*  --   --    Liver Function Tests: Recent Labs  Lab 06/05/19 1018 06/05/19 1703 06/06/19 1022  AST  --   --  97*  ALT  --   --  59*  ALKPHOS  --   --  278*  BILITOT  --   --  3.2*  PROT  --   --  6.6  ALBUMIN 2.7* 2.6* 2.5*   No results for input(s): LIPASE, AMYLASE in the last 168 hours. No results for input(s): AMMONIA in the last 168 hours. CBC: Recent Labs  Lab 06/09/19 0430 06/09/19 1352 06/10/19 0540 06/11/19 0551 06/12/19 0513  WBC 8.7 8.4 10.1 8.9 9.2  NEUTROABS 4.5  --   --  5.1 6.0  HGB 7.9* 8.4* 8.4* 7.9* 8.8*  HCT 24.4* 26.4* 26.7* 25.2* 28.6*  MCV 99.2 102.7* 104.3* 100.0 104.4*  PLT 140* 163 191 234 303   Cardiac Enzymes: No results for  input(s): CKTOTAL, CKMB, CKMBINDEX, TROPONINI in the last 168 hours. BNP: Invalid input(s): POCBNP CBG: Recent Labs  Lab 06/11/19 1520 06/11/19 1954 06/11/19 2350 06/12/19 0352 06/12/19 0756  GLUCAP 197* 218* 201* 190* 224*     IMAGING RESULTS:     ASSESSMENT AND PLAN    -Multidisciplinary rounds held today  Acute Hypoxic Respiratory Failure with Left Lower Lobe Pneumonia - Left lower lobe pneumonia demonstrated on CT chest 12/20- continue treatment with unasyn - patient previously positive for pseudomonas in 07/2018 on peritoneal catheter.   -continue Full MV support -continue Bronchodilator Therapy -Wean Fio2 and PEEP as tolerated -will perform SAT/SBT when respiratory parameters are met  Acute ischemic CVA - consulting with neurology - MRI head on 12/20 demonstrated patchy multifocal acute ischemic infarcts. MRA head on 12/20 demonstrated no large vessel occlusion, stenosis or acute vascular abnormality.   - EEG consistent with no seizures, diffuse encephalopathy - Carotid ultrasound demonstrates mild occlusion, not clinically significant - ICU telemonitoring - Tachypnea overriding vent, fixed gaze up and to left, diaphoretic with weaning off fentanyl - Spoke with patient's sister, plan for family meeting to discuss goals of care   Renal Failure-ESRD -follow chem 7 -follow UO -continue Foley Catheter-assess need daily - last hemodialysis yesterday -Nephrology consulting on case  ID -continue IV abx as prescibed: unasyn -follow up cultures  GI/Nutrition GI PROPHYLAXIS as indicated DIET-->TF's as tolerated  Constipation protocol as indicated  ENDO - ICU hypoglycemic\Hyperglycemia protocol -check FSBS per protocol   ELECTROLYTES -follow labs as needed -replace as needed -pharmacy consultation   DVT/GI PRX ordered -SCDs  TRANSFUSIONS AS NEEDED MONITOR FSBS ASSESS the need  for LABS as needed   Critical care provider statement:    Critical care  time (minutes):  35   Critical care time was exclusive of:  Separately billable procedures and treating other patients   Critical care was necessary to treat or prevent imminent or life-threatening deterioration of the following conditions:  acute respiratory failure, acute CVA,    Critical care was time spent personally by me on the following activities:  Development of treatment plan with patient or surrogate, discussions with consultants, evaluation of patient's response to treatment, examination of patient, obtaining history from patient or surrogate, ordering and performing treatments and interventions, ordering and review of laboratory studies and re-evaluation of patient's condition.  I assumed direction of critical care for this patient from another provider in my specialty: no    This document was prepared using Dragon voice recognition software and may include unintentional dictation errors.    Ottie Glazier, M.D.  Division of [facility]

## 2019-06-12 NOTE — Progress Notes (Signed)
[facility]  ROUNDING NOTE   Subjective:    patient is sedated on fentanyl   vent depended on 21 % FIO2 He has tube feed at 40 mL per hour ( vital AF) Rectal tube producing brown liquid stool    Objective:  Vital signs in last 24 hours:  Temp:  [98.7 F (37.1 C)-99.9 F (37.7 C)] 98.7 F (37.1 C) (12/29 0700) Pulse Rate:  [81-98] 87 (12/29 0700) Resp:  [9-18] 9 (12/29 0700) BP: (98-153)/(42-65) 111/54 (12/29 0700) SpO2:  [89 %-100 %] 97 % (12/29 0746) FiO2 (%):  [21 %] 21 % (12/29 0746)  Weight change:  Filed Weights   06/05/19 0448 06/06/19 0424 06/07/19 0248  Weight: 121.9 kg 123.7 kg 122.7 kg    Intake/Output: I/O last 3 completed shifts: In: 2658.7 [I.V.:1018.7; NG/GT:1640] Out: 2400 [Other:1000; EOFHQ:1975]   Intake/Output this shift:  No intake/output data recorded.  Physical Exam: General: Critically ill   HEENT: ETT   Lungs:  Vent assisted, coarse breath sounds at bases  Heart: Regular, 3/6 systolic murmur  Abdomen:  Soft, nontender, obese  Extremities: trace peripheral edema.  Neurologic: Intubated and sedated  Skin: No lesions  Access: Left femoral temp HD catheter 12/21 Dr. Lucky Cowboy Left Arm AVF Dr. Lucky Cowboy 8/83/25    Basic Metabolic Panel: Recent Labs  Lab 06/05/19 1018 06/05/19 1703 06/06/19 1022 06/06/19 1649 06/06/19 2246 06/07/19 0446 06/08/19 0417 06/09/19 0430 06/10/19 0540 06/11/19 0551 06/12/19 0513  NA 137 134* 133* 133* 134* 136 137 135 135 136 141  K 4.1 3.8 3.7 3.5 3.4* 3.5 3.7 4.0 4.0 4.2 3.8  CL 100 99 98 98 99 99 100 99 97* 96* 97*  CO2 25 21* _0 20* 23 21* 26  GLUCOSE 79 116* 194* 198* 185* 188* 152* 181* 216* 220* 217*  BUN 55* 44* 36* 33* 32* 32* 62* 87* 84* 118* 96*  CREATININE 6.30* 5.07* 3.51* 3.14* 2.91* 2.73* 5.26* 7.30* 6.34* 8.32* 7.17*  CALCIUM 8.0* 8.0* 8.7* 8.6* 8.6* 8.5* 8.0* 7.4* 7.4* 7.2* 7.6*  MG 1.9 1.9 1.9 2.0 1.9 2.1  --   --  2.5*  --   --   PHOS 5.0* 4.6  --   --   --   --   --    --  7.9*  --   --     Liver Function Tests: Recent Labs  Lab 06/05/19 1018 06/05/19 1703 06/06/19 1022  AST  --   --  97*  ALT  --   --  59*  ALKPHOS  --   --  278*  BILITOT  --   --  3.2*  PROT  --   --  6.6  ALBUMIN 2.7* 2.6* 2.5*   No results for input(s): LIPASE, AMYLASE in the last 168 hours. No results for input(s): AMMONIA in the last 168 hours.  CBC: Recent Labs  Lab 06/09/19 0430 06/09/19 1352 06/10/19 0540 06/11/19 0551 06/12/19 0513  WBC 8.7 8.4 10.1 8.9 9.2  NEUTROABS 4.5  --   --  5.1 6.0  HGB 7.9* 8.4* 8.4* 7.9* 8.8*  HCT 24.4* 26.4* 26.7* 25.2* 28.6*  MCV 99.2 102.7* 104.3* 100.0 104.4*  PLT 140* 163 191 234 303    Cardiac Enzymes: No results for input(s): CKTOTAL, CKMB, CKMBINDEX, TROPONINI in the last 168 hours.  BNP: Invalid input(s): POCBNP  CBG: Recent Labs  Lab 06/11/19 1520 06/11/19 1954 06/11/19 2350 06/12/19 0352 06/12/19 0756  GLUCAP 197* 218* 201*  190* 224*    Microbiology: Results for orders placed or performed during the hospital encounter of 06/06/2019  Respiratory Panel by RT PCR (Flu A&B, Covid) - Nasopharyngeal Swab     Status: None   Collection Time: 05/15/2019  2:35 AM   Specimen: Nasopharyngeal Swab  Result Value Ref Range Status   SARS Coronavirus 2 by RT PCR NEGATIVE NEGATIVE Final    Comment: (NOTE) SARS-CoV-2 target nucleic acids are NOT DETECTED. The SARS-CoV-2 RNA is generally detectable in upper respiratoy specimens during the acute phase of infection. The lowest concentration of SARS-CoV-2 viral copies this assay can detect is 131 copies/mL. A negative result does not preclude SARS-Cov-2 infection and should not be used as the sole basis for treatment or other patient management decisions. A negative result may occur with  improper specimen collection/handling, submission of specimen other than nasopharyngeal swab, presence of viral mutation(s) within the areas targeted by this assay, and inadequate number of  viral copies (<131 copies/mL). A negative result must be combined with clinical observations, patient history, and epidemiological information. The expected result is Negative. Fact Sheet for Patients:  PinkCheek.be Fact Sheet for Healthcare Providers:  GravelBags.it This test is not yet ap proved or cleared by the [place] FDA and  has been authorized for detection and/or diagnosis of SARS-CoV-2 by FDA under an Emergency Use Authorization (EUA). This EUA will remain  in effect (meaning this test can be used) for the duration of the COVID-19 declaration under Section 564(b)(1) of the Act, 21 U.S.C. section 360bbb-3(b)(1), unless the authorization is terminated or revoked sooner.    Influenza A by PCR NEGATIVE NEGATIVE Final   Influenza B by PCR NEGATIVE NEGATIVE Final    Comment: (NOTE) The Xpert Xpress SARS-CoV-2/FLU/RSV assay is intended as an aid in  the diagnosis of influenza from Nasopharyngeal swab specimens and  should not be used as a sole basis for treatment. Nasal washings and  aspirates are unacceptable for Xpert Xpress SARS-CoV-2/FLU/RSV  testing. Fact Sheet for Patients: PinkCheek.be Fact Sheet for Healthcare Providers: GravelBags.it This test is not yet approved or cleared by the [place] FDA and  has been authorized for detection and/or diagnosis of SARS-CoV-2 by  FDA under an Emergency Use Authorization (EUA). This EUA will remain  in effect (meaning this test can be used) for the duration of the  Covid-19 declaration under Section 564(b)(1) of the Act, 21  U.S.C. section 360bbb-3(b)(1), unless the authorization is  terminated or revoked. Performed at [facility], [address] [place], [place] [address]   Culture, blood (routine x 2)     Status: None   Collection Time: 06/07/2019  2:36 AM   Specimen: BLOOD  Result Value Ref  Range Status   Specimen Description BLOOD LEFT ANTECUBITAL  Final   Special Requests   Final    BOTTLES DRAWN AEROBIC AND ANAEROBIC Blood Culture adequate volume   Culture   Final    NO GROWTH 5 DAYS Performed at [facility], [address] [place],  [address]    Report Status 06/07/2019 FINAL  Final  Culture, blood (routine x 2)     Status: None   Collection Time: 06/07/2019  2:36 AM   Specimen: BLOOD  Result Value Ref Range Status   Specimen Description BLOOD RIGHT FOREARM  Final   Special Requests   Final    BOTTLES DRAWN AEROBIC AND ANAEROBIC Blood Culture adequate volume   Culture   Final    NO GROWTH  5 DAYS Performed at [facility], [address] [place], [place] [address]    Report Status 06/07/2019 FINAL  Final  Culture, blood (routine x 2)     Status: None   Collection Time: 06/12/2019  3:10 PM   Specimen: BLOOD  Result Value Ref Range Status   Specimen Description BLOOD A-LINE  Final   Special Requests   Final    BOTTLES DRAWN AEROBIC AND ANAEROBIC Blood Culture results may not be optimal due to an excessive volume of blood received in culture bottles   Culture   Final    NO GROWTH 5 DAYS Performed at [facility], [address] [place], [place] [address]    Report Status 06/07/2019 FINAL  Final  Culture, blood (routine x 2)     Status: None   Collection Time: 05/20/2019  3:10 PM   Specimen: BLOOD  Result Value Ref Range Status   Specimen Description BLOOD A-LINE  Final   Special Requests   Final    BOTTLES DRAWN AEROBIC AND ANAEROBIC Blood Culture results may not be optimal due to an excessive volume of blood received in culture bottles   Culture   Final    NO GROWTH 5 DAYS Performed at [facility], [address] [place], [place] [address]    Report Status 06/07/2019 FINAL  Final  MRSA PCR Screening     Status: None   Collection Time: 06/03/19  5:45 PM   Specimen: Nasopharyngeal  Result Value Ref Range  Status   MRSA by PCR NEGATIVE NEGATIVE Final    Comment:        The GeneXpert MRSA Assay (FDA approved for NASAL specimens only), is one component of a comprehensive MRSA colonization surveillance program. It is not intended to diagnose MRSA infection nor to guide or monitor treatment for MRSA infections. Performed at [facility], [address], [place], [place] [address]   Urine Culture     Status: None   Collection Time: 06/03/19  6:20 PM   Specimen: Urine, Catheterized  Result Value Ref Range Status   Specimen Description   Final    URINE, CATHETERIZED Performed at [facility], [address] [place], [place] [address]    Special Requests   Final    Immunocompromised Performed at [facility], [address], [place], [place] [address]    Culture   Final    NO GROWTH Performed at [facility], [place] [address], [place], [place] [address]    Report Status 06/05/2019 FINAL  Final  Cath Tip Culture     Status: None   Collection Time: 06/14/2019  4:32 PM   Specimen: Catheter Tip; Other  Result Value Ref Range Status   Specimen Description   Final    CATH TIP Performed at [facility], [address], [place], [place] [address]    Special Requests   Final    NONE Performed at [facility], [address], [place], [place] [address]    Culture   Final    NO GROWTH 2 DAYS Performed at [facility], [place] [address], [place], [place] [address]    Report Status 06/07/2019 FINAL  Final  Culture, respiratory (non-expectorated)     Status: None   Collection Time: 06/06/19 10:22 AM   Specimen: Tracheal Aspirate; Respiratory  Result Value Ref Range Status   Specimen Description   Final    TRACHEAL ASPIRATE Performed at [facility], [address] [place], [place] [address]    Special Requests   Final    NONE Performed at [facility], [address] [place], [place] [address]    Gram  Stain   Final    RARE WBC PRESENT, PREDOMINANTLY PMN NO ORGANISMS SEEN Performed at [facility], [place] [address] [place], [place] [address]    Culture   Final    RARE CANDIDA ALBICANS RARE FUNGUS (MOLD) ISOLATED, PROBABLE CONTAMINANT/COLONIZER (SAPROPHYTE). CONTACT MICROBIOLOGY IF FURTHER IDENTIFICATION REQUIRED [phone].    Report Status 06/09/2019 FINAL  Final  CULTURE, BLOOD (ROUTINE X 2) w Reflex to ID Panel     Status: None (Preliminary result)   Collection Time: 06/11/19  1:09 PM   Specimen: BLOOD  Result Value Ref Range Status   Specimen Description BLOOD A-LINE DRAW  Final   Special Requests   Final    BOTTLES DRAWN AEROBIC AND ANAEROBIC Blood Culture adequate volume   Culture   Final    NO GROWTH < 24 HOURS Performed at [facility], [address], [place], [place] [address]    Report Status PENDING  Incomplete  CULTURE, BLOOD (ROUTINE X 2) w Reflex to ID Panel     Status: None (Preliminary result)   Collection Time: 06/11/19  1:10 PM   Specimen: BLOOD  Result Value Ref Range Status   Specimen Description BLOOD A-LINE DRAW  Final   Special Requests   Final    BOTTLES DRAWN AEROBIC AND ANAEROBIC Blood Culture adequate volume   Culture   Final    NO GROWTH < 24 HOURS Performed at [facility], [address], [place], [place] [address]    Report Status PENDING  Incomplete  Culture, respiratory (non-expectorated)     Status: None (Preliminary result)   Collection Time: 06/11/19  6:04 PM   Specimen: Tracheal Aspirate; Respiratory  Result Value Ref Range Status   Specimen Description   Final    TRACHEAL ASPIRATE Performed at [facility], [address] [place], [place] [address]    Special Requests   Final    NONE Performed at [facility], [address] [place], [place] [address]    Gram Stain   Final    MODERATE WBC PRESENT, PREDOMINANTLY PMN FEW YEAST    Culture   Final    CULTURE REINCUBATED FOR BETTER  GROWTH Performed at [facility], [place] [address], [place], [place] [address]    Report Status PENDING  Incomplete    Coagulation Studies: No results for input(s): LABPROT, INR in the last 72 hours.  Urinalysis: No results for input(s): COLORURINE, LABSPEC, PHURINE, GLUCOSEU, HGBUR, BILIRUBINUR, KETONESUR, PROTEINUR, UROBILINOGEN, NITRITE, LEUKOCYTESUR in the last 72 hours.  Invalid input(s): APPERANCEUR    Imaging: No results found.   Medications:   . sodium chloride 5 mL/hr at 06/11/19 0000  . dexmedetomidine (PRECEDEX) IV infusion    . fentaNYL infusion INTRAVENOUS 200 mcg/hr (06/12/19 0600)  . norepinephrine (LEVOPHED) Adult infusion 3 mcg/min (06/06/19 0019)   .  stroke: mapping our early stages of recovery book   Does not apply Once  . aspirin  324 mg Per Tube Daily  . atorvastatin  80 mg Per Tube q morning - 10a  . B-complex with vitamin C  1 tablet Per Tube Daily  . calcitRIOL  0.25 mcg Per Tube Daily  . chlorhexidine gluconate (MEDLINE KIT)  15 mL Mouth Rinse BID  . Chlorhexidine Gluconate Cloth  6 each Topical Q0600  . clopidogrel  75 mg Per Tube Daily  . epoetin (EPOGEN/PROCRIT) injection  10,000 Units Intravenous Q M,W,F-HD  . feeding supplement (PRO-STAT SUGAR FREE 64)  60 mL Per Tube TID  . feeding supplement (VITAL HIGH PROTEIN)  1,000 mL Per Tube Q24H  . insulin aspart  0-9 Units Subcutaneous Q4H  . ipratropium-albuterol  3 mL Nebulization TID  . mouth rinse  15 mL Mouth Rinse 10 times per day  . pantoprazole sodium  40 mg Per Tube Daily  . senna-docusate  1 tablet Per Tube BID  . sevelamer carbonate  0.8 g Oral TID WC  . sodium chloride flush  10-40 mL Intracatheter Q12H   acetaminophen **OR** acetaminophen, heparin, ipratropium-albuterol, midazolam, ondansetron **OR** ondansetron (ZOFRAN) IV, sodium chloride flush, vecuronium  Assessment/ Plan:  [name] is a 48 y.o. Hispanic male with end stage renal disease, diabetes mellitus type I,  hypertension, sleep apnea who was admitted to [facility] on 06/12/2019 for Hyperkalemia [E87.5] CVA (cerebral vascular accident) ([place]) [I63.9] Stroke ([place]) [I63.9] ESRD (end stage renal disease) on dialysis ([place]) [N18.6, Z99.2] Chest pain [R07.9]  [facility] MWF [address] RIJ 120kg  1. End stage renal disease on hemodialysis. Requiring CRRT from 12/21 to 12/24  resume MWF schedule Complication of dialysis device: permcath removed. Currently with temp HD catheter.  AVF maturing. 1000 cc removed with HD yesterday Next HD scheduled for wednesday  2. Pneumonia: with acute respiratory failure requiring intubation and mechanical ventilation.    - Abx completed -   possible tracheostomy in  Near future  3. Anemia of chronic kidney disease: macrocytic.  Lab Results  Component Value Date   HGB 8.8 (L) 06/12/2019   - EPO with HD treatment.   4. Secondary Hyperparathyroidism with hypocalcemia and hyperphosphatemia.  Off calcium acetate Lab Results  Component Value Date   CALCIUM 7.6 (L) 06/12/2019   PHOS 7.9 (H) 06/10/2019  current tube feeds Vital AF Continue renvela powder and calcitriol   LOS: 10 Jake Gomez 12/29/20209:09 AM

## 2019-06-13 ENCOUNTER — Inpatient Hospital Stay: Payer: Medicare Other

## 2019-06-13 LAB — BASIC METABOLIC PANEL
Anion gap: 19 — ABNORMAL HIGH (ref 5–15)
BUN: 109 mg/dL — ABNORMAL HIGH (ref 6–20)
CO2: 25 mmol/L (ref 22–32)
Calcium: 7.3 mg/dL — ABNORMAL LOW (ref 8.9–10.3)
Chloride: 96 mmol/L — ABNORMAL LOW (ref 98–111)
Creatinine, Ser: 8.94 mg/dL — ABNORMAL HIGH (ref 0.61–1.24)
GFR calc Af Amer: 7 mL/min — ABNORMAL LOW (ref >60)
GFR calc non Af Amer: 6 mL/min — ABNORMAL LOW (ref >60)
Glucose, Bld: 232 mg/dL — ABNORMAL HIGH (ref 70–99)
Potassium: 4.2 mmol/L (ref 3.5–5.1)
Sodium: 140 mmol/L (ref 135–145)

## 2019-06-13 LAB — GLUCOSE, CAPILLARY
Glucose-Capillary: 164 mg/dL — ABNORMAL HIGH (ref 70–99)
Glucose-Capillary: 195 mg/dL — ABNORMAL HIGH (ref 70–99)
Glucose-Capillary: 204 mg/dL — ABNORMAL HIGH (ref 70–99)
Glucose-Capillary: 207 mg/dL — ABNORMAL HIGH (ref 70–99)
Glucose-Capillary: 239 mg/dL — ABNORMAL HIGH (ref 70–99)
Glucose-Capillary: 239 mg/dL — ABNORMAL HIGH (ref 70–99)

## 2019-06-13 LAB — CBC
HCT: 26.2 % — ABNORMAL LOW (ref 39.0–52.0)
Hemoglobin: 8.2 g/dL — ABNORMAL LOW (ref 13.0–17.0)
MCH: 31.4 pg (ref 26.0–34.0)
MCHC: 31.3 g/dL (ref 30.0–36.0)
MCV: 100.4 fL — ABNORMAL HIGH (ref 80.0–100.0)
Platelets: 291 10*3/uL (ref 150–400)
RBC: 2.61 MIL/uL — ABNORMAL LOW (ref 4.22–5.81)
RDW: 16.3 % — ABNORMAL HIGH (ref 11.5–15.5)
WBC: 11.2 10*3/uL — ABNORMAL HIGH (ref 4.0–10.5)
nRBC: 0 % (ref 0.0–0.2)

## 2019-06-13 LAB — PROCALCITONIN: Procalcitonin: 13.55 ng/mL

## 2019-06-13 LAB — MAGNESIUM: Magnesium: 2.6 mg/dL — ABNORMAL HIGH (ref 1.7–2.4)

## 2019-06-13 MED ORDER — MIDAZOLAM HCL 2 MG/2ML IJ SOLN
2.0000 mg | Freq: Once | INTRAMUSCULAR | Status: AC
  Administered 2019-06-13: 01:00:00 2 mg via INTRAVENOUS

## 2019-06-13 NOTE — Progress Notes (Signed)
[facility]  ROUNDING NOTE   Subjective:    patient is sedated on fentanyl  When titrated off sedation, patient got agitated as per nursing staff  vent depended on 21 % FIO2 He has tube feed at 40 mL per hour ( vital AF) Rectal tube producing brown liquid stool  Nurse reports bleeding through penis   Objective:  Vital signs in last 24 hours:  Temp:  [97.6 F (36.4 C)-99.1 F (37.3 C)] 98.5 F (36.9 C) (12/30 0500) Pulse Rate:  [80-111] 87 (12/30 0600) Resp:  [11-19] 16 (12/30 0600) BP: (78-195)/(38-67) 95/50 (12/30 0600) SpO2:  [94 %-100 %] 98 % (12/30 0820) FiO2 (%):  [21 %-28 %] 24 % (12/30 0820)  Weight change:  Filed Weights   06/05/19 0448 06/06/19 0424 06/07/19 0248  Weight: 121.9 kg 123.7 kg 122.7 kg    Intake/Output: I/O last 3 completed shifts: In: 2421.1 [I.V.:1021.1; NG/GT:1400] Out: 550 [Stool:550]   Intake/Output this shift:  No intake/output data recorded.  Physical Exam: General: Critically ill   HEENT: ETT   Lungs:  Vent assisted, coarse breath sounds at bases  Heart: Regular, 3/6 systolic murmur  Abdomen:  Soft, nontender, obese  Extremities: trace peripheral edema.  Neurologic: Intubated and sedated, eyes open and has left lateral gaze at times  Skin: No lesions  Access: Left femoral temp HD catheter 12/21 Dr. Lucky Cowboy Left Arm AVF Dr. Lucky Cowboy 9/62/22    Basic Metabolic Panel: Recent Labs  Lab 06/06/19 1649 06/06/19 2246 06/07/19 0446 06/09/19 0430 06/10/19 0540 06/11/19 0551 06/12/19 0513 06/13/19 0453  NA 133* 134* 136 135 135 136 141 140  K 3.5 3.4* 3.5 4.0 4.0 4.2 3.8 4.2  CL 98 99 99 99 97* 96* 97* 96*  CO2 '25 27 23 '$ 20* 23 21* 26 25  GLUCOSE 198* 185* 188* 181* 216* 220* 217* 232*  BUN 33* 32* 32* 87* 84* 118* 96* 109*  CREATININE 3.14* 2.91* 2.73* 7.30* 6.34* 8.32* 7.17* 8.94*  CALCIUM 8.6* 8.6* 8.5* 7.4* 7.4* 7.2* 7.6* 7.3*  MG 2.0 1.9 2.1  --  2.5*  --   --  2.6*  PHOS  --   --   --   --  7.9*  --   --   --      Liver Function Tests: Recent Labs  Lab 06/06/19 1022  AST 97*  ALT 59*  ALKPHOS 278*  BILITOT 3.2*  PROT 6.6  ALBUMIN 2.5*   No results for input(s): LIPASE, AMYLASE in the last 168 hours. No results for input(s): AMMONIA in the last 168 hours.  CBC: Recent Labs  Lab 06/09/19 0430 06/09/19 1352 06/10/19 0540 06/11/19 0551 06/12/19 0513 06/13/19 0453  WBC 8.7 8.4 10.1 8.9 9.2 11.2*  NEUTROABS 4.5  --   --  5.1 6.0  --   HGB 7.9* 8.4* 8.4* 7.9* 8.8* 8.2*  HCT 24.4* 26.4* 26.7* 25.2* 28.6* 26.2*  MCV 99.2 102.7* 104.3* 100.0 104.4* 100.4*  PLT 140* 163 191 234 303 291    Cardiac Enzymes: No results for input(s): CKTOTAL, CKMB, CKMBINDEX, TROPONINI in the last 168 hours.  BNP: Invalid input(s): POCBNP  CBG: Recent Labs  Lab 06/12/19 1627 06/12/19 1938 06/12/19 2332 06/13/19 0346 06/13/19 0747  GLUCAP 242* 267* 239* 204* 195*    Microbiology: Results for orders placed or performed during the hospital encounter of 06/05/2019  Respiratory Panel by RT PCR (Flu A&B, Covid) - Nasopharyngeal Swab     Status: None   Collection Time: 05/31/2019  2:35 AM   Specimen: Nasopharyngeal Swab  Result Value Ref Range Status   SARS Coronavirus 2 by RT PCR NEGATIVE NEGATIVE Final    Comment: (NOTE) SARS-CoV-2 target nucleic acids are NOT DETECTED. The SARS-CoV-2 RNA is generally detectable in upper respiratoy specimens during the acute phase of infection. The lowest concentration of SARS-CoV-2 viral copies this assay can detect is 131 copies/mL. A negative result does not preclude SARS-Cov-2 infection and should not be used as the sole basis for treatment or other patient management decisions. A negative result may occur with  improper specimen collection/handling, submission of specimen other than nasopharyngeal swab, presence of viral mutation(s) within the areas targeted by this assay, and inadequate number of viral copies (<131 copies/mL). A negative result must be  combined with clinical observations, patient history, and epidemiological information. The expected result is Negative. Fact Sheet for Patients:  PinkCheek.be Fact Sheet for Healthcare Providers:  GravelBags.it This test is not yet ap proved or cleared by the [place] FDA and  has been authorized for detection and/or diagnosis of SARS-CoV-2 by FDA under an Emergency Use Authorization (EUA). This EUA will remain  in effect (meaning this test can be used) for the duration of the COVID-19 declaration under Section 564(b)(1) of the Act, 21 U.S.C. section 360bbb-3(b)(1), unless the authorization is terminated or revoked sooner.    Influenza A by PCR NEGATIVE NEGATIVE Final   Influenza B by PCR NEGATIVE NEGATIVE Final    Comment: (NOTE) The Xpert Xpress SARS-CoV-2/FLU/RSV assay is intended as an aid in  the diagnosis of influenza from Nasopharyngeal swab specimens and  should not be used as a sole basis for treatment. Nasal washings and  aspirates are unacceptable for Xpert Xpress SARS-CoV-2/FLU/RSV  testing. Fact Sheet for Patients: PinkCheek.be Fact Sheet for Healthcare Providers: GravelBags.it This test is not yet approved or cleared by the [place] FDA and  has been authorized for detection and/or diagnosis of SARS-CoV-2 by  FDA under an Emergency Use Authorization (EUA). This EUA will remain  in effect (meaning this test can be used) for the duration of the  Covid-19 declaration under Section 564(b)(1) of the Act, 21  U.S.C. section 360bbb-3(b)(1), unless the authorization is  terminated or revoked. Performed at [facility], [address] [place], [place] [address]   Culture, blood (routine x 2)     Status: None   Collection Time: 05/15/2019  2:36 AM   Specimen: BLOOD  Result Value Ref Range Status   Specimen Description BLOOD LEFT ANTECUBITAL   Final   Special Requests   Final    BOTTLES DRAWN AEROBIC AND ANAEROBIC Blood Culture adequate volume   Culture   Final    NO GROWTH 5 DAYS Performed at [facility], [address] [place], [place] [address]    Report Status 06/07/2019 FINAL  Final  Culture, blood (routine x 2)     Status: None   Collection Time: 05/31/2019  2:36 AM   Specimen: BLOOD  Result Value Ref Range Status   Specimen Description BLOOD RIGHT FOREARM  Final   Special Requests   Final    BOTTLES DRAWN AEROBIC AND ANAEROBIC Blood Culture adequate volume   Culture   Final    NO GROWTH 5 DAYS Performed at [facility], [address] [place],  [address]    Report Status 06/07/2019 FINAL  Final  Culture, blood (routine x 2)     Status: None   Collection Time: 06/10/2019  3:10  PM   Specimen: BLOOD  Result Value Ref Range Status   Specimen Description BLOOD A-LINE  Final   Special Requests   Final    BOTTLES DRAWN AEROBIC AND ANAEROBIC Blood Culture results may not be optimal due to an excessive volume of blood received in culture bottles   Culture   Final    NO GROWTH 5 DAYS Performed at [facility], [address] [place], [place] [address]    Report Status 06/07/2019 FINAL  Final  Culture, blood (routine x 2)     Status: None   Collection Time: 05/26/2019  3:10 PM   Specimen: BLOOD  Result Value Ref Range Status   Specimen Description BLOOD A-LINE  Final   Special Requests   Final    BOTTLES DRAWN AEROBIC AND ANAEROBIC Blood Culture results may not be optimal due to an excessive volume of blood received in culture bottles   Culture   Final    NO GROWTH 5 DAYS Performed at [facility], [address] [place], [place] [address]    Report Status 06/07/2019 FINAL  Final  MRSA PCR Screening     Status: None   Collection Time: 06/03/19  5:45 PM   Specimen: Nasopharyngeal  Result Value Ref Range Status   MRSA by PCR NEGATIVE NEGATIVE Final    Comment:         The GeneXpert MRSA Assay (FDA approved for NASAL specimens only), is one component of a comprehensive MRSA colonization surveillance program. It is not intended to diagnose MRSA infection nor to guide or monitor treatment for MRSA infections. Performed at [facility], [address] [place], [place] [address]   Urine Culture     Status: None   Collection Time: 06/03/19  6:20 PM   Specimen: Urine, Catheterized  Result Value Ref Range Status   Specimen Description   Final    URINE, CATHETERIZED Performed at [facility], [address] [place], [place] [address]    Special Requests   Final    Immunocompromised Performed at [facility], [address] [place], [place] [address]    Culture   Final    NO GROWTH Performed at [facility], [place] [address] [place], [place] [address]    Report Status 06/05/2019 FINAL  Final  Cath Tip Culture     Status: None   Collection Time: 05/15/2019  4:32 PM   Specimen: Catheter Tip; Other  Result Value Ref Range Status   Specimen Description   Final    CATH TIP Performed at [facility], [address] [place], [place] [address]    Special Requests   Final    NONE Performed at [facility], [address] [place], [place] [address]    Culture   Final    NO GROWTH 2 DAYS Performed at [facility], [place] [address], [place], [place] [address]    Report Status 06/07/2019 FINAL  Final  Culture, respiratory (non-expectorated)     Status: None   Collection Time: 06/06/19 10:22 AM   Specimen: Tracheal Aspirate; Respiratory  Result Value Ref Range Status   Specimen Description   Final    TRACHEAL ASPIRATE Performed at [facility], [address], [place], [place] [address]    Special Requests   Final    NONE Performed at [facility], [address] [place], [place] [address]    Gram Stain   Final  RARE WBC PRESENT, PREDOMINANTLY PMN NO ORGANISMS  SEEN Performed at [place] [address] [place], [place] [address]    Culture   Final    RARE CANDIDA ALBICANS RARE FUNGUS (MOLD) ISOLATED, PROBABLE CONTAMINANT/COLONIZER (SAPROPHYTE). CONTACT MICROBIOLOGY IF FURTHER IDENTIFICATION REQUIRED [phone].    Report Status 06/09/2019 FINAL  Final  CULTURE, BLOOD (ROUTINE X 2) w Reflex to ID Panel     Status: None (Preliminary result)   Collection Time: 06/11/19  1:09 PM   Specimen: BLOOD  Result Value Ref Range Status   Specimen Description BLOOD A-LINE DRAW  Final   Special Requests   Final    BOTTLES DRAWN AEROBIC AND ANAEROBIC Blood Culture adequate volume   Culture   Final    NO GROWTH < 24 HOURS Performed at [facility], [address] [place], [place] [address]    Report Status PENDING  Incomplete  CULTURE, BLOOD (ROUTINE X 2) w Reflex to ID Panel     Status: None (Preliminary result)   Collection Time: 06/11/19  1:10 PM   Specimen: BLOOD  Result Value Ref Range Status   Specimen Description BLOOD A-LINE DRAW  Final   Special Requests   Final    BOTTLES DRAWN AEROBIC AND ANAEROBIC Blood Culture adequate volume   Culture   Final    NO GROWTH < 24 HOURS Performed at [facility], [address] [place], [place] [address]    Report Status PENDING  Incomplete  Culture, respiratory (non-expectorated)     Status: None (Preliminary result)   Collection Time: 06/11/19  6:04 PM   Specimen: Tracheal Aspirate; Respiratory  Result Value Ref Range Status   Specimen Description   Final    TRACHEAL ASPIRATE Performed at [facility], [address] [place], [place] [address]    Special Requests   Final    NONE Performed at [facility], [address] [place], [place] [address]    Gram Stain   Final    MODERATE WBC PRESENT, PREDOMINANTLY PMN FEW YEAST    Culture   Final    FEW YEAST IDENTIFICATION TO FOLLOW Performed at [facility], [place] [address],  [place], [place] [address]    Report Status PENDING  Incomplete    Coagulation Studies: No results for input(s): LABPROT, INR in the last 72 hours.  Urinalysis: No results for input(s): COLORURINE, LABSPEC, PHURINE, GLUCOSEU, HGBUR, BILIRUBINUR, KETONESUR, PROTEINUR, UROBILINOGEN, NITRITE, LEUKOCYTESUR in the last 72 hours.  Invalid input(s): APPERANCEUR    Imaging: DG Chest Port 1 View  Result Date: 06/13/2019 CLINICAL DATA:  Acute respiratory failure. EXAM: PORTABLE CHEST 1 VIEW COMPARISON:  June 08, 2019. FINDINGS: Stable cardiomediastinal silhouette. Endotracheal and nasogastric tubes are unchanged in position. No pneumothorax is noted. Hypoinflation of the lungs is noted. Mildly increased bilateral lung opacities are noted concerning for atelectasis or possibly infiltrates. Bony thorax is unremarkable. Small left pleural effusion is noted. IMPRESSION: Stable support apparatus. Hypoinflation of the lungs. Mildly increased bilateral lung opacities are noted concerning for atelectasis or possibly infiltrates. Small left pleural effusion is noted. Electronically Signed   By: Marijo Conception M.D.   On: 06/13/2019 07:52     Medications:   . sodium chloride 5 mL/hr at 06/12/19 1900  . fentaNYL infusion INTRAVENOUS 275 mcg/hr (06/13/19 0600)   .  stroke: mapping our early stages of recovery book   Does not apply Once  . aspirin  324 mg Per Tube Daily  .  atorvastatin  80 mg Per Tube q morning - 10a  . B-complex with vitamin C  1 tablet Per Tube Daily  . calcitRIOL  0.25 mcg Per Tube Daily  . chlorhexidine gluconate (MEDLINE KIT)  15 mL Mouth Rinse BID  . Chlorhexidine Gluconate Cloth  6 each Topical Q0600  . clopidogrel  75 mg Per Tube Daily  . epoetin (EPOGEN/PROCRIT) injection  10,000 Units Intravenous Q M,W,F-HD  . feeding supplement (PRO-STAT SUGAR FREE 64)  60 mL Per Tube TID  . feeding supplement (VITAL HIGH PROTEIN)  1,000 mL Per Tube Q24H  . insulin aspart  0-9 Units  Subcutaneous Q4H  . ipratropium-albuterol  3 mL Nebulization TID  . mouth rinse  15 mL Mouth Rinse 10 times per day  . pantoprazole sodium  40 mg Per Tube Daily  . senna-docusate  1 tablet Per Tube BID  . sevelamer carbonate  0.8 g Oral TID WC  . sodium chloride flush  10-40 mL Intracatheter Q12H   acetaminophen **OR** acetaminophen, heparin, ipratropium-albuterol, labetalol, midazolam, ondansetron **OR** ondansetron (ZOFRAN) IV, sodium chloride flush, vecuronium  Assessment/ Plan:  [name] is a 48 y.o. Hispanic male with end stage renal disease, diabetes mellitus type I, hypertension, sleep apnea who was admitted to [facility] on 06/13/2019 for Hyperkalemia [E87.5] CVA (cerebral vascular accident) ([place]) [I63.9] Stroke ([place]) [I63.9] ESRD (end stage renal disease) on dialysis ([place]) [N18.6, Z99.2] Chest pain [R07.9]  [facility] MWF [address] [address] RIJ 120kg  1. End stage renal disease on hemodialysis. Requiring CRRT from 12/21 to 12/24 Continued on  MWF schedule Complication of dialysis device: permcath removed. Currently with temp HD catheter.  AVF maturing. 1000 cc removed with last HD  Next HD scheduled for later today  2. Pneumonia: with acute respiratory failure requiring intubation and mechanical ventilation.    - Abx completed -   possible tracheostomy in  Near future  3. Anemia of chronic kidney disease: macrocytic.  Lab Results  Component Value Date   HGB 8.2 (L) 06/13/2019   - EPO with HD treatment.   4. Secondary Hyperparathyroidism with hypocalcemia and hyperphosphatemia.  Off calcium acetate Lab Results  Component Value Date   CALCIUM 7.3 (L) 06/13/2019   PHOS 7.9 (H) 06/10/2019  current tube feeds Vital AF Continue renvela powder and calcitriol  5. Acute ischemic stroke With encephalopathy Repeat MRI today    LOS: 11 Jake Gomez 12/30/202010:13 AM

## 2019-06-13 NOTE — Progress Notes (Signed)
   06/13/19 1450  Neurological  Level of Consciousness Responds to Pain  Orientation Level Intubated/Tracheostomy - Unable to assess  Respiratory  Respiratory Pattern Irregular;Labored  Bilateral Breath Sounds Diminished  Cardiac  Pulse Regular  Heart Sounds S1, S2  ECG Monitor Yes  Cardiac Rhythm ST  PT STABLE FOR HD TX CVC WDL UFG 1L RN AT BS.

## 2019-06-13 NOTE — Progress Notes (Signed)
   06/13/19 1917  Neurological  Level of Consciousness Responds to Pain  Orientation Level Intubated/Tracheostomy - Unable to assess  Respiratory  Respiratory Pattern Irregular  Bilateral Breath Sounds Diminished  Cardiac  Pulse Regular  Heart Sounds S1, S2  ECG Monitor Yes  pt stable to.lerated hd well cvc wdl ufg 1L vitaLS STABLE

## 2019-06-13 NOTE — Progress Notes (Signed)
CRITICAL CARE PROGRESS NOTE    Name: Jake Gomez MRN: [id number] DOB: Jun 10, 1971     LOS: 44   SUBJECTIVE FINDINGS & SIGNIFICANT EVENTS   Patient description:  Jake Gomez is a 48 yo male with a history of T2DM, HTN, OSA, recent COVID-19 infection and ESRD on dialysis M/W/F.  He presented to the ED on 12/19 for generalized malaise after missing dialysis on Wednesday and Friday for a trip to [place].  He then had a CVA.    Currently experiencing acute hypoxic respiratory failure.     Lines / Drains: Hemodialysis catheter on Right IJ Hemodialysis catheter left femoral NG tube 8 mm ET tube airway CVC triple lumen  Cultures / Sepsis markers: 12/28: Tracheal aspirate demonstrates moderate WBC with PMNs and few yeast.  Reincubated for better growth.    Antibiotics: Unasyn 3 g at 200 mL/hr   Protocols / Consultants: Neurology Cardiology Nephrology  Tests / Events: 12/19: presented to the ED for malaise after missing 2 dialysis sessions.  Potassium 6.4, Creatinine 15.4.  Acute ischemic CVA, with negative MR angiogram.   12/20: CT angio chest demonstrates consolidation in the left lower lobe.  No evidence of PE.    12/29  Has become diaphoretic this AM with weaning fentanyl, and episodes of tachypnea over-riding ventilator.  Meeting in person with family  12/30- relatively unchanged with hemineglect, s/p repeat MRI -increased size brain infarcts  PAST MEDICAL HISTORY   Past Medical History:  Diagnosis Date  . Diabetes mellitus without complication ([place])   . Hypertension   . Renal disorder   . Sleep apnea    can't afford CPAP     SURGICAL HISTORY   Past Surgical History:  Procedure Laterality Date  . AV FISTULA PLACEMENT Left 01/31/2019   Procedure: ARTERIOVENOUS (AV) FISTULA CREATION (  BRACHIOCEPHALIC );  Surgeon: Algernon Huxley, MD;  Location: [facility];  Service: Vascular;  Laterality: Left;  . DIALYSIS/PERMA CATHETER INSERTION N/A 07/19/2018   Procedure: DIALYSIS/PERMA CATHETER INSERTION;  Surgeon: Algernon Huxley, MD;  Location: [place] [facility];  Service: Cardiovascular;  Laterality: N/A;  . DIALYSIS/PERMA CATHETER INSERTION N/A 09/13/2018   Procedure: DIALYSIS/PERMA CATHETER INSERTION;  Surgeon: Katha Cabal, MD;  Location: [place] [facility];  Service: Cardiovascular;  Laterality: N/A;  . DIALYSIS/PERMA CATHETER REMOVAL N/A 05/27/2019   Procedure: DIALYSIS/PERMA CATHETER REMOVAL, temporary dialysis catheter placement;  Surgeon: Algernon Huxley, MD;  Location: [place] [facility];  Service: Cardiovascular;  Laterality: N/A;  . peritonal dialysis    . REMOVAL OF A DIALYSIS CATHETER N/A 07/17/2018   Procedure: REMOVAL OF A DIALYSIS CATHETER;  Surgeon: Algernon Huxley, MD;  Location: [facility];  Service: Vascular;  Laterality: N/A;     FAMILY HISTORY   Family History  Problem Relation Age of Onset  . Thyroid disease Mother   . Stroke Father   . Cancer Father   . Diabetes Father      SOCIAL HISTORY   Social History   Tobacco Use  . Smoking status: Former Smoker    Quit date: 11/14/2009    Years since quitting: 9.5  . Smokeless tobacco: Former Systems developer    Quit date: 07/16/2009  Substance Use Topics  . Alcohol use: No  . Drug use: Not Currently    Comment: history 10 years ago   No urine production     MEDICATIONS   Current Medication:  Current Facility-Administered Medications:  .   stroke: mapping our early stages of  recovery book, , Does not apply, Once, Morrison, Brenda, NP .  0.9 %  sodium chloride infusion, 250 mL, Intravenous, Continuous, Williams, Jamiese M, MD, Last Rate: 5 mL/hr at 06/13/19 1600, Rate Verify at 06/13/19 1600 .  acetaminophen (TYLENOL) tablet 650 mg, 650 mg, Per Tube, Q6H PRN **OR** acetaminophen (TYLENOL) suppository 650 mg, 650 mg,  Rectal, Q6H PRN, Moran, Christopher W, RPH .  aspirin chewable tablet 324 mg, 324 mg, Per Tube, Daily, Kasa, Kurian, MD, 324 mg at 06/12/19 1005 .  atorvastatin (LIPITOR) tablet 80 mg, 80 mg, Per Tube, q morning - 10a, Moran, Christopher W, RPH, 80 mg at 06/12/19 1716 .  B-complex with vitamin C tablet 1 tablet, 1 tablet, Per Tube, Daily, Kasa, Kurian, MD, 1 tablet at 06/13/19 0956 .  calcitRIOL (ROCALTROL) 1 MCG/ML solution 0.25 mcg, 0.25 mcg, Per Tube, Daily, Singh, Harmeet, MD, 0.25 mcg at 06/13/19 0956 .  chlorhexidine gluconate (MEDLINE KIT) (PERIDEX) 0.12 % solution 15 mL, 15 mL, Mouth Rinse, BID, Kasa, Kurian, MD, 15 mL at 06/13/19 0954 .  Chlorhexidine Gluconate Cloth 2 % PADS 6 each, 6 each, Topical, Q0600, Bhutani, Manpreet S, MD, 6 each at 06/13/19 0609 .  clopidogrel (PLAVIX) tablet 75 mg, 75 mg, Per Tube, Daily, Aleskerov, Fuad, MD, 75 mg at 06/12/19 1005 .  epoetin alfa (EPOGEN) injection 10,000 Units, 10,000 Units, Intravenous, Q M,W,F-HD, Singh, Harmeet, MD, 10,000 Units at 06/11/19 1413 .  feeding supplement (PRO-STAT SUGAR FREE 64) liquid 60 mL, 60 mL, Per Tube, TID, Kasa, Kurian, MD, 60 mL at 06/13/19 0955 .  feeding supplement (VITAL HIGH PROTEIN) liquid 1,000 mL, 1,000 mL, Per Tube, Q24H, Kasa, Kurian, MD, 1,000 mL at 06/13/19 0205 .  fentaNYL 2500mcg in NS 250mL (10mcg/ml) infusion-PREMIX, 0-400 mcg/hr, Intravenous, Continuous, Kasa, Kurian, MD, Last Rate: 27.5 mL/hr at 06/13/19 1600, 275 mcg/hr at 06/13/19 1600 .  heparin injection 1,000-6,000 Units, 1,000-6,000 Units, CRRT, PRN, Kasa, Kurian, MD .  insulin aspart (novoLOG) injection 0-9 Units, 0-9 Units, Subcutaneous, Q4H, Keene, Jeremiah D, NP, 3 Units at 06/13/19 1627 .  ipratropium-albuterol (DUONEB) 0.5-2.5 (3) MG/3ML nebulizer solution 3 mL, 3 mL, Nebulization, Q6H PRN, Williams, Jamiese M, MD .  ipratropium-albuterol (DUONEB) 0.5-2.5 (3) MG/3ML nebulizer solution 3 mL, 3 mL, Nebulization, TID, Kasa, Kurian, MD, 3 mL at  06/13/19 1306 .  labetalol (NORMODYNE) injection 10 mg, 10 mg, Intravenous, Q6H PRN, Aleskerov, Fuad, MD .  MEDLINE mouth rinse, 15 mL, Mouth Rinse, 10 times per day, Kasa, Kurian, MD, 15 mL at 06/13/19 1627 .  midazolam (VERSED) injection 2 mg, 2 mg, Intravenous, Q4H PRN, Kasa, Kurian, MD, 2 mg at 06/13/19 1545 .  ondansetron (ZOFRAN) tablet 4 mg, 4 mg, Per Tube, Q6H PRN **OR** ondansetron (ZOFRAN) injection 4 mg, 4 mg, Intravenous, Q6H PRN, Moran, Christopher W, RPH .  pantoprazole sodium (PROTONIX) 40 mg/20 mL oral suspension 40 mg, 40 mg, Per Tube, Daily, Kasa, Kurian, MD, 40 mg at 06/13/19 0955 .  senna-docusate (Senokot-S) tablet 1 tablet, 1 tablet, Per Tube, BID, Kasa, Kurian, MD, 1 tablet at 06/13/19 0955 .  sevelamer carbonate (RENVELA) packet 0.8 g, 0.8 g, Oral, TID WC, Singh, Harmeet, MD, 0.8 g at 06/13/19 1248 .  sodium chloride flush (NS) 0.9 % injection 10-40 mL, 10-40 mL, Intracatheter, Q12H, Kasa, Kurian, MD, 10 mL at 06/13/19 0956 .  sodium chloride flush (NS) 0.9 % injection 10-40 mL, 10-40 mL, Intracatheter, PRN, Kasa, Kurian, MD .  vecuronium (NORCURON) injection 10 mg, 10 mg, Intravenous, Q1H PRN,   Flora Lipps, MD, 10 mg at 06/12/19 1238    ALLERGIES   Patient has no known allergies.    REVIEW OF SYSTEMS   Unable to complete due to severity of patient illness  PHYSICAL EXAMINATION   Vital Signs: Temp:  [97.6 F (36.4 C)-99.1 F (37.3 C)] 98.9 F (37.2 C) (12/30 1600) Pulse Rate:  [84-119] 95 (12/30 1745) Resp:  [13-26] 23 (12/30 1745) BP: (78-235)/(38-69) 100/54 (12/30 1745) SpO2:  [86 %-100 %] 98 % (12/30 1745) FiO2 (%):  [24 %] 24 % (12/30 1600)  GENERAL: Patient sedated, intubated and on vent.  He opens eyes in response to verbal stimulation.   HEAD: Normocephalic, atraumatic.  EYES: Pupils equal, round, reactive to light.  Eyes deviated to up and to the left.  No scleral icterus.  MOUTH: Moist mucosal membrane. NECK: Supple. No thyromegaly. No  nodules. No JVD.  PULMONARY: Decreased breath sounds in Left lower lobe.  Patient exhibiting episodes of tachypnea overriding the vent.     CARDIOVASCULAR: S1 and S2. Regular rate and rhythm. No murmurs, rubs, or gallops.  GASTROINTESTINAL: Soft, nontender, non-distended. No masses. Positive bowel sounds. No hepatosplenomegaly.  MUSCULOSKELETAL: No swelling, clubbing, or edema.  NEUROLOGIC: Mild distress due to acute illness. Non purposeful movement of bilateral arms, minimal nonpurposeful movements of bilateral legs.  Does not respond to commands.    SKIN:intact,warm,diaphoretic   PERTINENT DATA     Infusions: . sodium chloride 5 mL/hr at 06/13/19 1600  . fentaNYL infusion INTRAVENOUS 275 mcg/hr (06/13/19 1600)   Scheduled Medications: .  stroke: mapping our early stages of recovery book   Does not apply Once  . aspirin  324 mg Per Tube Daily  . atorvastatin  80 mg Per Tube q morning - 10a  . B-complex with vitamin C  1 tablet Per Tube Daily  . calcitRIOL  0.25 mcg Per Tube Daily  . chlorhexidine gluconate (MEDLINE KIT)  15 mL Mouth Rinse BID  . Chlorhexidine Gluconate Cloth  6 each Topical Q0600  . clopidogrel  75 mg Per Tube Daily  . epoetin (EPOGEN/PROCRIT) injection  10,000 Units Intravenous Q M,W,F-HD  . feeding supplement (PRO-STAT SUGAR FREE 64)  60 mL Per Tube TID  . feeding supplement (VITAL HIGH PROTEIN)  1,000 mL Per Tube Q24H  . insulin aspart  0-9 Units Subcutaneous Q4H  . ipratropium-albuterol  3 mL Nebulization TID  . mouth rinse  15 mL Mouth Rinse 10 times per day  . pantoprazole sodium  40 mg Per Tube Daily  . senna-docusate  1 tablet Per Tube BID  . sevelamer carbonate  0.8 g Oral TID WC  . sodium chloride flush  10-40 mL Intracatheter Q12H   PRN Medications: acetaminophen **OR** acetaminophen, heparin, ipratropium-albuterol, labetalol, midazolam, ondansetron **OR** ondansetron (ZOFRAN) IV, sodium chloride flush, vecuronium Hemodynamic parameters:     Intake/Output: 12/29 0701 - 12/30 0700 In: 1890.3 [I.V.:890.3; NG/GT:1000] Out: 150 [Stool:150]  Ventilator  Settings: Vent Mode: PCV FiO2 (%):  [24 %] 24 % Set Rate:  [16 bmp] 16 bmp PEEP:  [5 cmH20] 5 cmH20   Other Labs:  LAB RESULTS:  Basic Metabolic Panel: Recent Labs  Lab 06/06/19 2246 06/07/19 0446 06/09/19 0430 06/10/19 0540 06/11/19 0551 06/12/19 0513 06/13/19 0453  NA 134* 136 135 135 136 141 140  K 3.4* 3.5 4.0 4.0 4.2 3.8 4.2  CL 99 99 99 97* 96* 97* 96*  CO2 27 23 20* 23 21* 26 25  GLUCOSE 185* 188* 181* 216* 220* 217*  232*  BUN 32* 32* 87* 84* 118* 96* 109*  CREATININE 2.91* 2.73* 7.30* 6.34* 8.32* 7.17* 8.94*  CALCIUM 8.6* 8.5* 7.4* 7.4* 7.2* 7.6* 7.3*  MG 1.9 2.1  --  2.5*  --   --  2.6*  PHOS  --   --   --  7.9*  --   --   --    Liver Function Tests: No results for input(s): AST, ALT, ALKPHOS, BILITOT, PROT, ALBUMIN in the last 168 hours. No results for input(s): LIPASE, AMYLASE in the last 168 hours. No results for input(s): AMMONIA in the last 168 hours. CBC: Recent Labs  Lab 06/09/19 0430 06/09/19 1352 06/10/19 0540 06/11/19 0551 06/12/19 0513 06/13/19 0453  WBC 8.7 8.4 10.1 8.9 9.2 11.2*  NEUTROABS 4.5  --   --  5.1 6.0  --   HGB 7.9* 8.4* 8.4* 7.9* 8.8* 8.2*  HCT 24.4* 26.4* 26.7* 25.2* 28.6* 26.2*  MCV 99.2 102.7* 104.3* 100.0 104.4* 100.4*  PLT 140* 163 191 234 303 291   Cardiac Enzymes: No results for input(s): CKTOTAL, CKMB, CKMBINDEX, TROPONINI in the last 168 hours. BNP: Invalid input(s): POCBNP CBG: Recent Labs  Lab 06/12/19 2332 06/13/19 0346 06/13/19 0747 06/13/19 1133 06/13/19 1546  GLUCAP 239* 204* 195* 239* 207*     IMAGING RESULTS:  Imaging: MR BRAIN WO CONTRAST  Result Date: 06/13/2019 CLINICAL DATA:  Focal neuro deficit, greater than 6 hours, stroke suspected. Additional history provided: Acute ischemic stroke within cephalopathy. EXAM: MRI HEAD WITHOUT CONTRAST TECHNIQUE: Multiplanar, multiecho pulse  sequences of the brain and surrounding structures were obtained without intravenous contrast. COMPARISON:  MRI/MRA head 05/15/2019 FINDINGS: Brain: Multiple sequences are mildly motion degraded. Again demonstrated are patchy acute/early subacute ischemic infarcts within the bilateral centrum semiovale, corona radiata and periventricular white matter. Infarction changes have increased in extent as compared to prior examination 05/16/2019. For instance, infarction changes in the bilateral centrum semiovale/corona radiata appear more confluent. Additionally, there are several small new infarcts within the periatrial white matter bilaterally. Corresponding T2/FLAIR hyperintensity at these sites. Redemonstrated small focus of chronic microhemorrhage within the right centrum semiovale. No midline shift or extra-axial fluid collection. Cerebral volume is normal for age. Vascular: Flow voids maintained within the proximal large arterial vessels. Skull and upper cervical spine: No focal marrow lesion Sinuses/Orbits: Visualized orbits demonstrate no acute abnormality. Mild scattered paranasal sinus mucosal thickening. Trace fluid within bilateral mastoid air cells (greater on the right). IMPRESSION: Again demonstrated are patchy acute/early subacute infarcts within the bilateral centrum semiovale, corona radiata and periventricular white matter. These infarcts have increased in extent since prior MRI 06/03/2019, as described. No significant mass effect or midline shift. Electronically Signed   By: Kyle  Golden DO   On: 06/13/2019 14:51   DG Chest Port 1 View  Result Date: 06/13/2019 CLINICAL DATA:  Acute respiratory failure. EXAM: PORTABLE CHEST 1 VIEW COMPARISON:  June 08, 2019. FINDINGS: Stable cardiomediastinal silhouette. Endotracheal and nasogastric tubes are unchanged in position. No pneumothorax is noted. Hypoinflation of the lungs is noted. Mildly increased bilateral lung opacities are noted concerning for  atelectasis or possibly infiltrates. Bony thorax is unremarkable. Small left pleural effusion is noted. IMPRESSION: Stable support apparatus. Hypoinflation of the lungs. Mildly increased bilateral lung opacities are noted concerning for atelectasis or possibly infiltrates. Small left pleural effusion is noted. Electronically Signed   By: James  Green Jr M.D.   On: 06/13/2019 07:52   @[email or web address]@   ASSESSMENT AND PLAN    -Multidisciplinary rounds   held today  Acute Hypoxic Respiratory Failure with Left Lower Lobe Pneumonia - Left lower lobe pneumonia demonstrated on CT chest 12/20- continue treatment with unasyn - patient previously positive for pseudomonas in 07/2018 on peritoneal catheter.   -continue Full MV support -continue Bronchodilator Therapy -Wean Fio2 and PEEP as tolerated -will perform SAT/SBT when respiratory parameters are met  Acute ischemic CVA - consulting with neurology - MRI head on 12/20 demonstrated patchy multifocal acute ischemic infarcts. MRA head on 12/20 demonstrated no large vessel occlusion, stenosis or acute vascular abnormality.   - EEG consistent with no seizures, diffuse encephalopathy - Carotid ultrasound demonstrates mild occlusion, not clinically significant - ICU telemonitoring - Tachypnea overriding vent, fixed gaze up and to left, diaphoretic with weaning off fentanyl - Spoke with patient's sister, plan for family meeting to discuss goals of care   Renal Failure-ESRD -follow chem 7 -follow UO -continue Foley Catheter-assess need daily - last hemodialysis yesterday -Nephrology consulting on case  ID -continue IV abx as prescibed: unasyn -follow up cultures  GI/Nutrition GI PROPHYLAXIS as indicated DIET-->TF's as tolerated  Constipation protocol as indicated  ENDO - ICU hypoglycemic\Hyperglycemia protocol -check FSBS per protocol   ELECTROLYTES -follow labs as needed -replace as needed -pharmacy consultation   DVT/GI PRX  ordered -SCDs  TRANSFUSIONS AS NEEDED MONITOR FSBS ASSESS the need for LABS as needed   Critical care provider statement:    Critical care time (minutes):  35   Critical care time was exclusive of:  Separately billable procedures and treating other patients   Critical care was necessary to treat or prevent imminent or life-threatening deterioration of the following conditions:  acute respiratory failure, acute CVA,    Critical care was time spent personally by me on the following activities:  Development of treatment plan with patient or surrogate, discussions with consultants, evaluation of patient's response to treatment, examination of patient, obtaining history from patient or surrogate, ordering and performing treatments and interventions, ordering and review of laboratory studies and re-evaluation of patient's condition.  I assumed direction of critical care for this patient from another provider in my specialty: no    This document was prepared using Dragon voice recognition software and may include unintentional dictation errors.    Ottie Glazier, M.D.  Division of [facility]

## 2019-06-13 NOTE — Progress Notes (Signed)
Ch f/u with the pt family via telephone. Pt is a 82 YOM that has suffered form a stroke and is currently intubated. Ch observed that pt is not responsive currently but vitals were good. Ch spoke w/ pt's sister who shared that the family would be meeting with members of the pt's care team regarding his POC. Ch shared that the pt may be able to help arrange for a priest to visit if staffing allows. Ch will communicate with the charge nurse to determine what is permissible.    06/13/19 1200  Clinical Encounter Type  Visited With Patient;Family;Health care provider  Visit Type Social support;Spiritual support;Critical Care  Spiritual Encounters  Spiritual Needs Emotional;Grief support  Stress Factors  Patient Stress Factors Health changes;Loss of control;Major life changes  Family Stress Factors Major life changes;Loss;Family relationships

## 2019-06-13 NOTE — Progress Notes (Signed)
Subjective: Patient intubated and sedated.  Less responsive than at encounter on admission.    Objective: Current vital signs: BP (!) 95/50   Pulse 87   Temp 98.5 F (36.9 C) (Oral)   Resp 16   Ht 5' 7.99" (1.727 m)   Wt 122.7 kg   SpO2 98%   BMI 41.14 kg/m  Vital signs in last 24 hours: Temp:  [97.6 F (36.4 C)-99.1 F (37.3 C)] 98.5 F (36.9 C) (12/30 0500) Pulse Rate:  [80-105] 87 (12/30 0600) Resp:  [11-19] 16 (12/30 0600) BP: (78-195)/(38-67) 95/50 (12/30 0600) SpO2:  [94 %-100 %] 98 % (12/30 0820) FiO2 (%):  [21 %-28 %] 24 % (12/30 0820)  Intake/Output from previous day: 12/29 0701 - 12/30 0700 In: 1762.9 [I.V.:802.9; NG/GT:960] Out: 150 [Stool:150] Intake/Output this shift: No intake/output data recorded. Nutritional status:  Diet Order    None      Neurologic Exam: Mental Status: Patient with eyes open.  Will squeeze left hand to command.  No attempts at speech.   Cranial Nerves: II: patient does not respond confrontation bilaterally, pupils right 2 mm, left 2 mm,and reactive bilaterally III,IV,VI: Oculocephalic response present bilaterally. Eyes upgoing and to the left at rest but at one point did seem to look to the right at examiner. V,VII: corneal reflex reduced bilaterally  VIII: patient does not respond to verbal stimuli IX,X: gag reflex reduced, XI: trapezius strength unable to test bilaterally XII: tongue strength unable to test Motor: Spontaneous movement and to command with the LUE.  No other extremity movement noted Sensory: Does not respond to noxious stimuli in any extremity. Deep Tendon Reflexes:  Absent throughout.  Lab Results: Basic Metabolic Panel: Recent Labs  Lab 06/06/19 1649 06/06/19 2246 06/07/19 0446 06/09/19 0430 06/10/19 0540 06/11/19 0551 06/12/19 0513 06/13/19 0453  NA 133* 134* 136 135 135 136 141 140  K 3.5 3.4* 3.5 4.0 4.0 4.2 3.8 4.2  CL 98 99 99 99 97* 96* 97* 96*  CO2 '25 27 23 '$ 20* 23 21* 26 25  GLUCOSE  198* 185* 188* 181* 216* 220* 217* 232*  BUN 33* 32* 32* 87* 84* 118* 96* 109*  CREATININE 3.14* 2.91* 2.73* 7.30* 6.34* 8.32* 7.17* 8.94*  CALCIUM 8.6* 8.6* 8.5* 7.4* 7.4* 7.2* 7.6* 7.3*  MG 2.0 1.9 2.1  --  2.5*  --   --  2.6*  PHOS  --   --   --   --  7.9*  --   --   --     Liver Function Tests: No results for input(s): AST, ALT, ALKPHOS, BILITOT, PROT, ALBUMIN in the last 168 hours. No results for input(s): LIPASE, AMYLASE in the last 168 hours. No results for input(s): AMMONIA in the last 168 hours.  CBC: Recent Labs  Lab 06/09/19 0430 06/09/19 1352 06/10/19 0540 06/11/19 0551 06/12/19 0513 06/13/19 0453  WBC 8.7 8.4 10.1 8.9 9.2 11.2*  NEUTROABS 4.5  --   --  5.1 6.0  --   HGB 7.9* 8.4* 8.4* 7.9* 8.8* 8.2*  HCT 24.4* 26.4* 26.7* 25.2* 28.6* 26.2*  MCV 99.2 102.7* 104.3* 100.0 104.4* 100.4*  PLT 140* 163 191 234 303 291    Cardiac Enzymes: No results for input(s): CKTOTAL, CKMB, CKMBINDEX, TROPONINI in the last 168 hours.  Lipid Panel: No results for input(s): CHOL, TRIG, HDL, CHOLHDL, VLDL, LDLCALC in the last 168 hours.  CBG: Recent Labs  Lab 06/12/19 1627 06/12/19 1938 06/12/19 2332 06/13/19 0346 06/13/19 0747  GLUCAP  242* 267* 239* 204* 195*    Microbiology: Results for orders placed or performed during the hospital encounter of 05/17/2019  Respiratory Panel by RT PCR (Flu A&B, Covid) - Nasopharyngeal Swab     Status: None   Collection Time: 05/16/2019  2:35 AM   Specimen: Nasopharyngeal Swab  Result Value Ref Range Status   SARS Coronavirus 2 by RT PCR NEGATIVE NEGATIVE Final    Comment: (NOTE) SARS-CoV-2 target nucleic acids are NOT DETECTED. The SARS-CoV-2 RNA is generally detectable in upper respiratoy specimens during the acute phase of infection. The lowest concentration of SARS-CoV-2 viral copies this assay can detect is 131 copies/mL. A negative result does not preclude SARS-Cov-2 infection and should not be used as the sole basis for treatment  or other patient management decisions. A negative result may occur with  improper specimen collection/handling, submission of specimen other than nasopharyngeal swab, presence of viral mutation(s) within the areas targeted by this assay, and inadequate number of viral copies (<131 copies/mL). A negative result must be combined with clinical observations, patient history, and epidemiological information. The expected result is Negative. Fact Sheet for Patients:  PinkCheek.be Fact Sheet for Healthcare Providers:  GravelBags.it This test is not yet ap proved or cleared by the [place] FDA and  has been authorized for detection and/or diagnosis of SARS-CoV-2 by FDA under an Emergency Use Authorization (EUA). This EUA will remain  in effect (meaning this test can be used) for the duration of the COVID-19 declaration under Section 564(b)(1) of the Act, 21 U.S.C. section 360bbb-3(b)(1), unless the authorization is terminated or revoked sooner.    Influenza A by PCR NEGATIVE NEGATIVE Final   Influenza B by PCR NEGATIVE NEGATIVE Final    Comment: (NOTE) The Xpert Xpress SARS-CoV-2/FLU/RSV assay is intended as an aid in  the diagnosis of influenza from Nasopharyngeal swab specimens and  should not be used as a sole basis for treatment. Nasal washings and  aspirates are unacceptable for Xpert Xpress SARS-CoV-2/FLU/RSV  testing. Fact Sheet for Patients: PinkCheek.be Fact Sheet for Healthcare Providers: GravelBags.it This test is not yet approved or cleared by the [place] FDA and  has been authorized for detection and/or diagnosis of SARS-CoV-2 by  FDA under an Emergency Use Authorization (EUA). This EUA will remain  in effect (meaning this test can be used) for the duration of the  Covid-19 declaration under Section 564(b)(1) of the Act, 21  U.S.C. section  360bbb-3(b)(1), unless the authorization is  terminated or revoked. Performed at [facility], [address] [place], [place] [address]   Culture, blood (routine x 2)     Status: None   Collection Time: 05/22/2019  2:36 AM   Specimen: BLOOD  Result Value Ref Range Status   Specimen Description BLOOD LEFT ANTECUBITAL  Final   Special Requests   Final    BOTTLES DRAWN AEROBIC AND ANAEROBIC Blood Culture adequate volume   Culture   Final    NO GROWTH 5 DAYS Performed at [facility], [address] [place], [place] [address]    Report Status 06/07/2019 FINAL  Final  Culture, blood (routine x 2)     Status: None   Collection Time: 05/20/2019  2:36 AM   Specimen: BLOOD  Result Value Ref Range Status   Specimen Description BLOOD RIGHT FOREARM  Final   Special Requests   Final    BOTTLES DRAWN AEROBIC AND ANAEROBIC Blood Culture adequate volume   Culture   Final  NO GROWTH 5 DAYS Performed at [facility], [address] [place], [place] [address]    Report Status 06/07/2019 FINAL  Final  Culture, blood (routine x 2)     Status: None   Collection Time: 06/08/2019  3:10 PM   Specimen: BLOOD  Result Value Ref Range Status   Specimen Description BLOOD A-LINE  Final   Special Requests   Final    BOTTLES DRAWN AEROBIC AND ANAEROBIC Blood Culture results may not be optimal due to an excessive volume of blood received in culture bottles   Culture   Final    NO GROWTH 5 DAYS Performed at [facility], [address] [place], [place] [address]    Report Status 06/07/2019 FINAL  Final  Culture, blood (routine x 2)     Status: None   Collection Time: 05/18/2019  3:10 PM   Specimen: BLOOD  Result Value Ref Range Status   Specimen Description BLOOD A-LINE  Final   Special Requests   Final    BOTTLES DRAWN AEROBIC AND ANAEROBIC Blood Culture results may not be optimal due to an excessive volume of blood received in culture bottles   Culture   Final     NO GROWTH 5 DAYS Performed at [facility], [address] [place], [place] [address]    Report Status 06/07/2019 FINAL  Final  MRSA PCR Screening     Status: None   Collection Time: 06/03/19  5:45 PM   Specimen: Nasopharyngeal  Result Value Ref Range Status   MRSA by PCR NEGATIVE NEGATIVE Final    Comment:        The GeneXpert MRSA Assay (FDA approved for NASAL specimens only), is one component of a comprehensive MRSA colonization surveillance program. It is not intended to diagnose MRSA infection nor to guide or monitor treatment for MRSA infections. Performed at [facility], [address] [place], [place] [address]   Urine Culture     Status: None   Collection Time: 06/03/19  6:20 PM   Specimen: Urine, Catheterized  Result Value Ref Range Status   Specimen Description   Final    URINE, CATHETERIZED Performed at [facility], [address] [place], [place] [address]    Special Requests   Final    Immunocompromised Performed at [facility], [address], [place], [place] [address]    Culture   Final    NO GROWTH Performed at [facility], [place] [address], [place], [place] [address]    Report Status 06/05/2019 FINAL  Final  Cath Tip Culture     Status: None   Collection Time: 05/18/2019  4:32 PM   Specimen: Catheter Tip; Other  Result Value Ref Range Status   Specimen Description   Final    CATH TIP Performed at [facility], [address] [place], [place] [address]    Special Requests   Final    NONE Performed at [facility], [address], [place], [place] [address]    Culture   Final    NO GROWTH 2 DAYS Performed at [facility], [place] [address], [place], [place] [address]    Report Status 06/07/2019 FINAL  Final  Culture, respiratory (non-expectorated)     Status: None   Collection Time: 06/06/19 10:22 AM   Specimen: Tracheal Aspirate; Respiratory  Result Value Ref Range  Status   Specimen Description   Final    TRACHEAL ASPIRATE Performed  at [facility], [address], [place], [place] [address]    Special Requests   Final    NONE Performed at [facility], [address] [place], [place] [address]    Gram Stain   Final    RARE WBC PRESENT, PREDOMINANTLY PMN NO ORGANISMS SEEN Performed at [facility], [place] [address], [place], [place] [address]    Culture   Final    RARE CANDIDA ALBICANS RARE FUNGUS (MOLD) ISOLATED, PROBABLE CONTAMINANT/COLONIZER (SAPROPHYTE). CONTACT MICROBIOLOGY IF FURTHER IDENTIFICATION REQUIRED [phone].    Report Status 06/09/2019 FINAL  Final  CULTURE, BLOOD (ROUTINE X 2) w Reflex to ID Panel     Status: None (Preliminary result)   Collection Time: 06/11/19  1:09 PM   Specimen: BLOOD  Result Value Ref Range Status   Specimen Description BLOOD A-LINE DRAW  Final   Special Requests   Final    BOTTLES DRAWN AEROBIC AND ANAEROBIC Blood Culture adequate volume   Culture   Final    NO GROWTH < 24 HOURS Performed at [facility], [address], [place], [place] [address]    Report Status PENDING  Incomplete  CULTURE, BLOOD (ROUTINE X 2) w Reflex to ID Panel     Status: None (Preliminary result)   Collection Time: 06/11/19  1:10 PM   Specimen: BLOOD  Result Value Ref Range Status   Specimen Description BLOOD A-LINE DRAW  Final   Special Requests   Final    BOTTLES DRAWN AEROBIC AND ANAEROBIC Blood Culture adequate volume   Culture   Final    NO GROWTH < 24 HOURS Performed at [facility], [address] [place], [place] [address]    Report Status PENDING  Incomplete  Culture, respiratory (non-expectorated)     Status: None (Preliminary result)   Collection Time: 06/11/19  6:04 PM   Specimen: Tracheal Aspirate; Respiratory  Result Value Ref Range Status   Specimen Description   Final    TRACHEAL ASPIRATE Performed at [facility], [address]  [place], [place] [address]    Special Requests   Final    NONE Performed at [facility], [address] [place], [place] [address]    Gram Stain   Final    MODERATE WBC PRESENT, PREDOMINANTLY PMN FEW YEAST    Culture   Final    FEW YEAST IDENTIFICATION TO FOLLOW Performed at [facility], [place] [address], [place], [place] [address]    Report Status PENDING  Incomplete    Coagulation Studies: No results for input(s): LABPROT, INR in the last 72 hours.  Imaging: DG Chest Port 1 View  Result Date: 06/13/2019 CLINICAL DATA:  Acute respiratory failure. EXAM: PORTABLE CHEST 1 VIEW COMPARISON:  June 08, 2019. FINDINGS: Stable cardiomediastinal silhouette. Endotracheal and nasogastric tubes are unchanged in position. No pneumothorax is noted. Hypoinflation of the lungs is noted. Mildly increased bilateral lung opacities are noted concerning for atelectasis or possibly infiltrates. Bony thorax is unremarkable. Small left pleural effusion is noted. IMPRESSION: Stable support apparatus. Hypoinflation of the lungs. Mildly increased bilateral lung opacities are noted concerning for atelectasis or possibly infiltrates. Small left pleural effusion is noted. Electronically Signed   By: Marijo Conception M.D.   On: 06/13/2019 07:52    Medications:  I have reviewed the patient's current medications. Scheduled: .  stroke: mapping our early stages of recovery book   Does not apply Once  . aspirin  324  mg Per Tube Daily  . atorvastatin  80 mg Per Tube q morning - 10a  . B-complex with vitamin C  1 tablet Per Tube Daily  . calcitRIOL  0.25 mcg Per Tube Daily  . chlorhexidine gluconate (MEDLINE KIT)  15 mL Mouth Rinse BID  . Chlorhexidine Gluconate Cloth  6 each Topical Q0600  . clopidogrel  75 mg Per Tube Daily  . epoetin (EPOGEN/PROCRIT) injection  10,000 Units Intravenous Q M,W,F-HD  . feeding supplement (PRO-STAT SUGAR FREE 64)  60 mL Per Tube TID  . feeding supplement (VITAL HIGH  PROTEIN)  1,000 mL Per Tube Q24H  . insulin aspart  0-9 Units Subcutaneous Q4H  . ipratropium-albuterol  3 mL Nebulization TID  . mouth rinse  15 mL Mouth Rinse 10 times per day  . pantoprazole sodium  40 mg Per Tube Daily  . senna-docusate  1 tablet Per Tube BID  . sevelamer carbonate  0.8 g Oral TID WC  . sodium chloride flush  10-40 mL Intracatheter Q12H    Assessment/Plan: 48 y.o. male with a history of DM, HTN and ESRD admitted after an episode of syncope and seizure like activity.  Patient with multiple metabolic abnormalities/infection at presentation that may have provoked seizure like activity but MRI imaging performed as well and on review shows multiple acute, small infarcts bilaterally.  MRA unremarkable. Embolic etiology likely.  EEG was only significant for slowing.  Patient further declined from a respiratory standpoint requiring intubation and has had difficulty weaning.  Neurological status has declined as well.  Unclear if this is related to continued multiple medical issues or if he has had further ischemic events.  Patient on Plavix.    Recommendations: 1. Repeat MRI of the brain without contrast   LOS: 11 days   [name], MD Neurology [phone] 06/13/2019  11:01 AM

## 2019-06-14 DIAGNOSIS — H5589 Other irregular eye movements: Secondary | ICD-10-CM

## 2019-06-14 LAB — CBC
HCT: 24.4 % — ABNORMAL LOW (ref 39.0–52.0)
Hemoglobin: 7.1 g/dL — ABNORMAL LOW (ref 13.0–17.0)
MCH: 30.6 pg (ref 26.0–34.0)
MCHC: 29.1 g/dL — ABNORMAL LOW (ref 30.0–36.0)
MCV: 105.2 fL — ABNORMAL HIGH (ref 80.0–100.0)
Platelets: 308 10*3/uL (ref 150–400)
RBC: 2.32 MIL/uL — ABNORMAL LOW (ref 4.22–5.81)
RDW: 15.9 % — ABNORMAL HIGH (ref 11.5–15.5)
WBC: 8.9 10*3/uL (ref 4.0–10.5)
nRBC: 0 % (ref 0.0–0.2)

## 2019-06-14 LAB — BASIC METABOLIC PANEL
Anion gap: 16 — ABNORMAL HIGH (ref 5–15)
BUN: 87 mg/dL — ABNORMAL HIGH (ref 6–20)
CO2: 27 mmol/L (ref 22–32)
Calcium: 7.4 mg/dL — ABNORMAL LOW (ref 8.9–10.3)
Chloride: 98 mmol/L (ref 98–111)
Creatinine, Ser: 7.07 mg/dL — ABNORMAL HIGH (ref 0.61–1.24)
GFR calc Af Amer: 10 mL/min — ABNORMAL LOW (ref >60)
GFR calc non Af Amer: 8 mL/min — ABNORMAL LOW (ref >60)
Glucose, Bld: 179 mg/dL — ABNORMAL HIGH (ref 70–99)
Potassium: 3.9 mmol/L (ref 3.5–5.1)
Sodium: 141 mmol/L (ref 135–145)

## 2019-06-14 LAB — GLUCOSE, CAPILLARY
Glucose-Capillary: 165 mg/dL — ABNORMAL HIGH (ref 70–99)
Glucose-Capillary: 172 mg/dL — ABNORMAL HIGH (ref 70–99)
Glucose-Capillary: 185 mg/dL — ABNORMAL HIGH (ref 70–99)
Glucose-Capillary: 194 mg/dL — ABNORMAL HIGH (ref 70–99)
Glucose-Capillary: 201 mg/dL — ABNORMAL HIGH (ref 70–99)
Glucose-Capillary: 229 mg/dL — ABNORMAL HIGH (ref 70–99)

## 2019-06-14 LAB — CULTURE, RESPIRATORY W GRAM STAIN

## 2019-06-14 MED ORDER — LORAZEPAM 2 MG/ML IJ SOLN
2.0000 mg | Freq: Once | INTRAMUSCULAR | Status: AC
  Administered 2019-06-14: 04:00:00 2 mg via INTRAVENOUS

## 2019-06-14 MED ORDER — NOREPINEPHRINE 16 MG/250ML-% IV SOLN
0.0000 ug/min | INTRAVENOUS | Status: DC
  Administered 2019-06-14: 2 ug/min via INTRAVENOUS
  Filled 2019-06-14 (×3): qty 250

## 2019-06-14 MED ORDER — LEVETIRACETAM IN NACL 500 MG/100ML IV SOLN
500.0000 mg | Freq: Two times a day (BID) | INTRAVENOUS | Status: DC
  Administered 2019-06-14 – 2019-06-25 (×23): 500 mg via INTRAVENOUS
  Filled 2019-06-14 (×24): qty 100

## 2019-06-14 MED ORDER — STERILE WATER FOR INJECTION IJ SOLN
INTRAMUSCULAR | Status: AC
  Administered 2019-06-14: 10 mL
  Filled 2019-06-14: qty 10

## 2019-06-14 MED ORDER — LORAZEPAM 2 MG/ML IJ SOLN
INTRAMUSCULAR | Status: AC
  Filled 2019-06-14: qty 1

## 2019-06-14 MED ORDER — DEXMEDETOMIDINE HCL IN NACL 400 MCG/100ML IV SOLN
0.4000 ug/kg/h | INTRAVENOUS | Status: DC
  Administered 2019-06-14: 0.4 ug/kg/h via INTRAVENOUS
  Administered 2019-06-15: 09:00:00 0.6 ug/kg/h via INTRAVENOUS
  Administered 2019-06-15 (×2): 0.4 ug/kg/h via INTRAVENOUS
  Administered 2019-06-16 (×2): 0.6 ug/kg/h via INTRAVENOUS
  Administered 2019-06-16: 0.4 ug/kg/h via INTRAVENOUS
  Administered 2019-06-16: 0.6 ug/kg/h via INTRAVENOUS
  Administered 2019-06-16: 06:00:00 0.4 ug/kg/h via INTRAVENOUS
  Administered 2019-06-17: 0.6 ug/kg/h via INTRAVENOUS
  Administered 2019-06-17: 1 ug/kg/h via INTRAVENOUS
  Administered 2019-06-17 (×2): 0.6 ug/kg/h via INTRAVENOUS
  Administered 2019-06-18 – 2019-06-19 (×12): 1 ug/kg/h via INTRAVENOUS
  Filled 2019-06-14 (×25): qty 100

## 2019-06-14 NOTE — Progress Notes (Signed)
[facility]  ROUNDING NOTE   Subjective:   Patient is sedated on fentanyl  When titrated off sedation, patient got agitated as per nursing staff  vent depended on 21 % FIO2, high PEEP 18    Objective:  Vital signs in last 24 hours:  Temp:  [98.2 F (36.8 C)-99.7 F (37.6 C)] 98.5 F (36.9 C) (12/31 0400) Pulse Rate:  [79-119] 85 (12/31 0600) Resp:  [13-26] 16 (12/31 0600) BP: (96-235)/(44-72) 121/55 (12/31 0600) SpO2:  [86 %-100 %] 99 % (12/31 0600) FiO2 (%):  [24 %] 24 % (12/31 0740)  Weight change:  Filed Weights   06/05/19 0448 06/06/19 0424 06/07/19 0248  Weight: 121.9 kg 123.7 kg 122.7 kg    Intake/Output: I/O last 3 completed shifts: In: 2242 [I.V.:1092; NG/GT:1050; IV Piggyback:100] Out: 1050 [Other:1000; Stool:50]   Intake/Output this shift:  No intake/output data recorded.  Physical Exam: General: Critically ill   HEENT: ETT   Lungs:  Vent assisted, coarse breath sounds at bases  Heart: Regular, 3/6 systolic murmur  Abdomen:  Soft, nontender, obese  Extremities: trace peripheral edema.  Neurologic: Intubated and sedated, eyes open and has left lateral gaze at times  Skin: No lesions  Access: Left femoral temp HD catheter 12/21 Dr. Lucky Cowboy Left Arm AVF Dr. Lucky Cowboy 9/39/03    Basic Metabolic Panel: Recent Labs  Lab 06/10/19 0540 06/11/19 0551 06/12/19 0513 06/13/19 0453 06/14/19 0454  NA 135 136 141 140 141  K 4.0 4.2 3.8 4.2 3.9  CL 97* 96* 97* 96* 98  CO2 23 21* '26 25 27  '$ GLUCOSE 216* 220* 217* 232* 179*  BUN 84* 118* 96* 109* 87*  CREATININE 6.34* 8.32* 7.17* 8.94* 7.07*  CALCIUM 7.4* 7.2* 7.6* 7.3* 7.4*  MG 2.5*  --   --  2.6*  --   PHOS 7.9*  --   --   --   --     Liver Function Tests: No results for input(s): AST, ALT, ALKPHOS, BILITOT, PROT, ALBUMIN in the last 168 hours. No results for input(s): LIPASE, AMYLASE in the last 168 hours. No results for input(s): AMMONIA in the last 168 hours.  CBC: Recent Labs  Lab  06/09/19 0430 06/10/19 0540 06/11/19 0551 06/12/19 0513 06/13/19 0453 06/14/19 0454  WBC 8.7 10.1 8.9 9.2 11.2* 8.9  NEUTROABS 4.5  --  5.1 6.0  --   --   HGB 7.9* 8.4* 7.9* 8.8* 8.2* 7.1*  HCT 24.4* 26.7* 25.2* 28.6* 26.2* 24.4*  MCV 99.2 104.3* 100.0 104.4* 100.4* 105.2*  PLT 140* 191 234 303 291 308    Cardiac Enzymes: No results for input(s): CKTOTAL, CKMB, CKMBINDEX, TROPONINI in the last 168 hours.  BNP: Invalid input(s): POCBNP  CBG: Recent Labs  Lab 06/13/19 1546 06/13/19 1942 06/14/19 0014 06/14/19 0359 06/14/19 0728  GLUCAP 207* 164* 185* 172* 194*    Microbiology: Results for orders placed or performed during the hospital encounter of 05/15/2019  Respiratory Panel by RT PCR (Flu A&B, Covid) - Nasopharyngeal Swab     Status: None   Collection Time: 06/08/2019  2:35 AM   Specimen: Nasopharyngeal Swab  Result Value Ref Range Status   SARS Coronavirus 2 by RT PCR NEGATIVE NEGATIVE Final    Comment: (NOTE) SARS-CoV-2 target nucleic acids are NOT DETECTED. The SARS-CoV-2 RNA is generally detectable in upper respiratoy specimens during the acute phase of infection. The lowest concentration of SARS-CoV-2 viral copies this assay can detect is 131 copies/mL. A negative result does not  preclude SARS-Cov-2 infection and should not be used as the sole basis for treatment or other patient management decisions. A negative result may occur with  improper specimen collection/handling, submission of specimen other than nasopharyngeal swab, presence of viral mutation(s) within the areas targeted by this assay, and inadequate number of viral copies (<131 copies/mL). A negative result must be combined with clinical observations, patient history, and epidemiological information. The expected result is Negative. Fact Sheet for Patients:  PinkCheek.be Fact Sheet for Healthcare Providers:  GravelBags.it This test is not  yet ap proved or cleared by the [place] FDA and  has been authorized for detection and/or diagnosis of SARS-CoV-2 by FDA under an Emergency Use Authorization (EUA). This EUA will remain  in effect (meaning this test can be used) for the duration of the COVID-19 declaration under Section 564(b)(1) of the Act, 21 U.S.C. section 360bbb-3(b)(1), unless the authorization is terminated or revoked sooner.    Influenza A by PCR NEGATIVE NEGATIVE Final   Influenza B by PCR NEGATIVE NEGATIVE Final    Comment: (NOTE) The Xpert Xpress SARS-CoV-2/FLU/RSV assay is intended as an aid in  the diagnosis of influenza from Nasopharyngeal swab specimens and  should not be used as a sole basis for treatment. Nasal washings and  aspirates are unacceptable for Xpert Xpress SARS-CoV-2/FLU/RSV  testing. Fact Sheet for Patients: PinkCheek.be Fact Sheet for Healthcare Providers: GravelBags.it This test is not yet approved or cleared by the [place] FDA and  has been authorized for detection and/or diagnosis of SARS-CoV-2 by  FDA under an Emergency Use Authorization (EUA). This EUA will remain  in effect (meaning this test can be used) for the duration of the  Covid-19 declaration under Section 564(b)(1) of the Act, 21  U.S.C. section 360bbb-3(b)(1), unless the authorization is  terminated or revoked. Performed at [facility], [address] [place], [place] [address]   Culture, blood (routine x 2)     Status: None   Collection Time: 05/16/2019  2:36 AM   Specimen: BLOOD  Result Value Ref Range Status   Specimen Description BLOOD LEFT ANTECUBITAL  Final   Special Requests   Final    BOTTLES DRAWN AEROBIC AND ANAEROBIC Blood Culture adequate volume   Culture   Final    NO GROWTH 5 DAYS Performed at [facility], [address], [place], [place] [address]    Report Status 06/07/2019 FINAL  Final  Culture, blood  (routine x 2)     Status: None   Collection Time: 05/22/2019  2:36 AM   Specimen: BLOOD  Result Value Ref Range Status   Specimen Description BLOOD RIGHT FOREARM  Final   Special Requests   Final    BOTTLES DRAWN AEROBIC AND ANAEROBIC Blood Culture adequate volume   Culture   Final    NO GROWTH 5 DAYS Performed at [facility], [address] [place],  [address]    Report Status 06/07/2019 FINAL  Final  Culture, blood (routine x 2)     Status: None   Collection Time: 05/18/2019  3:10 PM   Specimen: BLOOD  Result Value Ref Range Status   Specimen Description BLOOD A-LINE  Final   Special Requests   Final    BOTTLES DRAWN AEROBIC AND ANAEROBIC Blood Culture results may not be optimal due to an excessive volume of blood received in culture bottles   Culture   Final    NO GROWTH 5 DAYS Performed at [facility], [address] [place], [place] [address]    Report Status 06/07/2019 FINAL  Final  Culture, blood (routine x 2)     Status: None   Collection Time: 05/20/2019  3:10 PM   Specimen: BLOOD  Result Value Ref Range Status   Specimen Description BLOOD A-LINE  Final   Special Requests   Final    BOTTLES DRAWN AEROBIC AND ANAEROBIC Blood Culture results may not be optimal due to an excessive volume of blood received in culture bottles   Culture   Final    NO GROWTH 5 DAYS Performed at [facility], [address] [place], [place] [address]    Report Status 06/07/2019 FINAL  Final  MRSA PCR Screening     Status: None   Collection Time: 06/03/19  5:45 PM   Specimen: Nasopharyngeal  Result Value Ref Range Status   MRSA by PCR NEGATIVE NEGATIVE Final    Comment:        The GeneXpert MRSA Assay (FDA approved for NASAL specimens only), is one component of a comprehensive MRSA colonization surveillance program. It is not intended to diagnose MRSA infection nor to guide or monitor treatment for MRSA infections. Performed at [facility], [address] [place], [place] [address]   Urine Culture     Status: None   Collection Time: 06/03/19  6:20 PM   Specimen: Urine, Catheterized  Result Value Ref Range Status   Specimen Description   Final    URINE, CATHETERIZED Performed at [facility], [address] [place], [place] [address]    Special Requests   Final    Immunocompromised Performed at [facility], [address], [place], [place] [address]    Culture   Final    NO GROWTH Performed at [facility], [place] [address] [place], [place] [address]    Report Status 06/05/2019 FINAL  Final  Cath Tip Culture     Status: None   Collection Time: 05/31/2019  4:32 PM   Specimen: Catheter Tip; Other  Result Value Ref Range Status   Specimen Description   Final    CATH TIP Performed at [facility], [address], [place], [place] [address]    Special Requests   Final    NONE Performed at [facility], [address], [place], [place] [address]    Culture   Final    NO GROWTH 2 DAYS Performed at [facility], [place] [address] [place], [place] [address]    Report Status 06/07/2019 FINAL  Final  Culture, respiratory (non-expectorated)     Status: None   Collection Time: 06/06/19 10:22 AM   Specimen: Tracheal Aspirate; Respiratory  Result Value Ref Range Status   Specimen Description   Final    TRACHEAL ASPIRATE Performed at [facility], [address], [place], [place] [address]    Special Requests   Final    NONE Performed at [facility], [address] [place], [place] [address]    Gram Stain   Final    RARE WBC PRESENT, PREDOMINANTLY PMN NO ORGANISMS SEEN Performed at [facility], [place] [address] [place],  [address]    Culture   Final    RARE CANDIDA ALBICANS RARE FUNGUS (MOLD) ISOLATED, PROBABLE CONTAMINANT/COLONIZER (SAPROPHYTE). CONTACT MICROBIOLOGY IF FURTHER IDENTIFICATION REQUIRED [phone].     Report Status 06/09/2019 FINAL  Final  CULTURE, BLOOD (ROUTINE X 2) w Reflex to ID Panel  Status: None (Preliminary result)   Collection Time: 06/11/19  1:09 PM   Specimen: BLOOD  Result Value Ref Range Status   Specimen Description BLOOD A-LINE DRAW  Final   Special Requests   Final    BOTTLES DRAWN AEROBIC AND ANAEROBIC Blood Culture adequate volume   Culture   Final    NO GROWTH 3 DAYS Performed at [facility], [address] [place], [place] [address]    Report Status PENDING  Incomplete  CULTURE, BLOOD (ROUTINE X 2) w Reflex to ID Panel     Status: None (Preliminary result)   Collection Time: 06/11/19  1:10 PM   Specimen: BLOOD  Result Value Ref Range Status   Specimen Description BLOOD A-LINE DRAW  Final   Special Requests   Final    BOTTLES DRAWN AEROBIC AND ANAEROBIC Blood Culture adequate volume   Culture   Final    NO GROWTH 3 DAYS Performed at [facility], [address] [place], [place] [address]    Report Status PENDING  Incomplete  Culture, respiratory (non-expectorated)     Status: None (Preliminary result)   Collection Time: 06/11/19  6:04 PM   Specimen: Tracheal Aspirate; Respiratory  Result Value Ref Range Status   Specimen Description   Final    TRACHEAL ASPIRATE Performed at [facility], [address] [place], [place] [address]    Special Requests   Final    NONE Performed at [facility], [address] [place], [place] [address]    Gram Stain   Final    MODERATE WBC PRESENT, PREDOMINANTLY PMN FEW YEAST    Culture   Final    FEW YEAST IDENTIFICATION TO FOLLOW Performed at [facility], [place] [address] [place], [place] [address]    Report Status PENDING  Incomplete    Coagulation Studies: No results for input(s): LABPROT, INR in the last 72 hours.  Urinalysis: No results for input(s): COLORURINE, LABSPEC, PHURINE, GLUCOSEU, HGBUR, BILIRUBINUR, KETONESUR, PROTEINUR, UROBILINOGEN, NITRITE,  LEUKOCYTESUR in the last 72 hours.  Invalid input(s): APPERANCEUR    Imaging: MR BRAIN WO CONTRAST  Result Date: 06/13/2019 CLINICAL DATA:  Focal neuro deficit, greater than 6 hours, stroke suspected. Additional history provided: Acute ischemic stroke within cephalopathy. EXAM: MRI HEAD WITHOUT CONTRAST TECHNIQUE: Multiplanar, multiecho pulse sequences of the brain and surrounding structures were obtained without intravenous contrast. COMPARISON:  MRI/MRA head 06/05/2019 FINDINGS: Brain: Multiple sequences are mildly motion degraded. Again demonstrated are patchy acute/early subacute ischemic infarcts within the bilateral centrum semiovale, corona radiata and periventricular white matter. Infarction changes have increased in extent as compared to prior examination 05/19/2019. For instance, infarction changes in the bilateral centrum semiovale/corona radiata appear more confluent. Additionally, there are several small new infarcts within the periatrial white matter bilaterally. Corresponding T2/FLAIR hyperintensity at these sites. Redemonstrated small focus of chronic microhemorrhage within the right centrum semiovale. No midline shift or extra-axial fluid collection. Cerebral volume is normal for age. Vascular: Flow voids maintained within the proximal large arterial vessels. Skull and upper cervical spine: No focal marrow lesion Sinuses/Orbits: Visualized orbits demonstrate no acute abnormality. Mild scattered paranasal sinus mucosal thickening. Trace fluid within bilateral mastoid air cells (greater on the right). IMPRESSION: Again demonstrated are patchy acute/early subacute infarcts within the bilateral centrum semiovale, corona radiata and periventricular white matter. These infarcts have increased in extent since prior MRI 06/03/2019, as described. No significant mass effect or midline shift. Electronically Signed   By: Kellie Simmering  DO   On: 06/13/2019 14:51   DG Chest Port 1 View  Result Date:  06/13/2019 CLINICAL DATA:  Acute respiratory failure. EXAM: PORTABLE CHEST 1 VIEW COMPARISON:  June 08, 2019. FINDINGS: Stable cardiomediastinal silhouette. Endotracheal and nasogastric tubes are unchanged in position. No pneumothorax is noted. Hypoinflation of the lungs is noted. Mildly increased bilateral lung opacities are noted concerning for atelectasis or possibly infiltrates. Bony thorax is unremarkable. Small left pleural effusion is noted. IMPRESSION: Stable support apparatus. Hypoinflation of the lungs. Mildly increased bilateral lung opacities are noted concerning for atelectasis or possibly infiltrates. Small left pleural effusion is noted. Electronically Signed   By: Marijo Conception M.D.   On: 06/13/2019 07:52     Medications:   . sodium chloride 5 mL/hr at 06/14/19 0600  . fentaNYL infusion INTRAVENOUS 275 mcg/hr (06/14/19 0600)  . levETIRAcetam Stopped (06/14/19 0410)   .  stroke: mapping our early stages of recovery book   Does not apply Once  . aspirin  324 mg Per Tube Daily  . atorvastatin  80 mg Per Tube q morning - 10a  . B-complex with vitamin C  1 tablet Per Tube Daily  . calcitRIOL  0.25 mcg Per Tube Daily  . chlorhexidine gluconate (MEDLINE KIT)  15 mL Mouth Rinse BID  . Chlorhexidine Gluconate Cloth  6 each Topical Q0600  . clopidogrel  75 mg Per Tube Daily  . epoetin (EPOGEN/PROCRIT) injection  10,000 Units Intravenous Q M,W,F-HD  . feeding supplement (PRO-STAT SUGAR FREE 64)  60 mL Per Tube TID  . feeding supplement (VITAL HIGH PROTEIN)  1,000 mL Per Tube Q24H  . insulin aspart  0-9 Units Subcutaneous Q4H  . ipratropium-albuterol  3 mL Nebulization TID  . mouth rinse  15 mL Mouth Rinse 10 times per day  . pantoprazole sodium  40 mg Per Tube Daily  . senna-docusate  1 tablet Per Tube BID  . sevelamer carbonate  0.8 g Oral TID WC  . sodium chloride flush  10-40 mL Intracatheter Q12H   acetaminophen **OR** acetaminophen, heparin, ipratropium-albuterol,  labetalol, midazolam, ondansetron **OR** ondansetron (ZOFRAN) IV, sodium chloride flush, vecuronium  Assessment/ Plan:  Jake Gomez is a 48 y.o. Hispanic male with end stage renal disease, diabetes mellitus type I, hypertension, sleep apnea who was admitted to [facility] on 06/[facility]2019 for Hyperkalemia [E87.5] CVA (cerebral vascular accident) ([place]) [I63.9] Stroke ([place]) [I63.9] ESRD (end stage renal disease) on dialysis ([place]) [N18.6, Z99.2] Chest pain [R07.9]  [facility] MWF [address] RIJ 120kg  1. End stage renal disease on hemodialysis. Requiring CRRT from 12/21 to 12/24 Continued on MWF schedule Complication of dialysis device: permcath removed.  Currently with temp HD catheter.  AVF maturing. Next HD planned for Friday  2. Acute Resp failure - Pneumonia: with acute respiratory failure requiring intubation and mechanical ventilation.    - Abx completed -   possible tracheostomy in Near future  3. Anemia of chronic kidney disease: macrocytic.  Lab Results  Component Value Date   HGB 7.1 (L) 06/14/2019   - EPO with HD treatment.   4. Secondary Hyperparathyroidism with hypocalcemia and hyperphosphatemia.  Off calcium acetate Lab Results  Component Value Date   CALCIUM 7.4 (L) 06/14/2019   PHOS 7.9 (H) 06/10/2019  current tube feeds Vital AF Continue renvela powder and calcitriol  5. Acute ischemic stroke With encephalopathy Repeat MRI done yesterday Neurologist following    LOS: 12 Jake Gomez 12/31/20209:25 AM

## 2019-06-14 NOTE — Progress Notes (Signed)
Subjective: Patient remains intubated and sedated.  Has not tolerated trials off sedation.    Objective: Current vital signs: BP (!) 97/45   Pulse 81   Temp 98 F (36.7 C) (Oral)   Resp 12   Ht 5' 7.99" (1.727 m)   Wt 122.7 kg   SpO2 95%   BMI 41.14 kg/m  Vital signs in last 24 hours: Temp:  [98 F (36.7 C)-99.7 F (37.6 C)] 98 F (36.7 C) (12/31 0800) Pulse Rate:  [79-119] 81 (12/31 1000) Resp:  [12-25] 12 (12/31 1000) BP: (96-235)/(44-72) 97/45 (12/31 1000) SpO2:  [88 %-100 %] 95 % (12/31 1000) FiO2 (%):  [24 %] 24 % (12/31 0800)  Intake/Output from previous day: 12/30 0701 - 12/31 0700 In: 2018.1 [I.V.:748.1; NG/GT:1170; IV Piggyback:100] Out: 1000  Intake/Output this shift: Total I/O In: 356.6 [I.V.:96.6; NG/GT:260] Out: 200 [Stool:200] Nutritional status:  Diet Order    None      Neurologic Exam: Mental Status: Moves LUE to sternal rub.  Does not follow commands.  No attempts at speech Cranial Nerves: II: patient does not respond confrontation bilaterally, pupils right 2 mm, left 2 mm,and reactive bilaterally III,IV,VI: Oculocephalic response present bilaterally. Eyes upgoing and to the left. V,VII: corneal reflex reduced bilaterally  VIII: patient does not respond to verbal stimuli IX,X: gag reflex reduced, XI: trapezius strength unable to test bilaterally XII: tongue strength unable to test Motor: Spontaneous movement of the LUE noted.  No other extremity movement noted Sensory: Does not respond to noxious stimuli in any extremity. Deep Tendon Reflexes:  Absent throughout.  Lab Results: Basic Metabolic Panel: Recent Labs  Lab 06/10/19 0540 06/11/19 0551 06/12/19 0513 06/13/19 0453 06/14/19 0454  NA 135 136 141 140 141  K 4.0 4.2 3.8 4.2 3.9  CL 97* 96* 97* 96* 98  CO2 23 21* _0 GLUCOSE 216* 220* 217* 232* 179*  BUN 84* 118* 96* 109* 87*  CREATININE 6.34* 8.32* 7.17* 8.94* 7.07*  CALCIUM 7.4* 7.2* 7.6* 7.3* 7.4*  MG 2.5*  --   --   2.6*  --   PHOS 7.9*  --   --   --   --     Liver Function Tests: No results for input(s): AST, ALT, ALKPHOS, BILITOT, PROT, ALBUMIN in the last 168 hours. No results for input(s): LIPASE, AMYLASE in the last 168 hours. No results for input(s): AMMONIA in the last 168 hours.  CBC: Recent Labs  Lab 06/09/19 0430 06/10/19 0540 06/11/19 0551 06/12/19 0513 06/13/19 0453 06/14/19 0454  WBC 8.7 10.1 8.9 9.2 11.2* 8.9  NEUTROABS 4.5  --  5.1 6.0  --   --   HGB 7.9* 8.4* 7.9* 8.8* 8.2* 7.1*  HCT 24.4* 26.7* 25.2* 28.6* 26.2* 24.4*  MCV 99.2 104.3* 100.0 104.4* 100.4* 105.2*  PLT 140* 191 234 303 291 308    Cardiac Enzymes: No results for input(s): CKTOTAL, CKMB, CKMBINDEX, TROPONINI in the last 168 hours.  Lipid Panel: No results for input(s): CHOL, TRIG, HDL, CHOLHDL, VLDL, LDLCALC in the last 168 hours.  CBG: Recent Labs  Lab 06/13/19 1546 06/13/19 1942 06/14/19 0014 06/14/19 0359 06/14/19 0728  GLUCAP 207* 164* 185* 172* 194*    Microbiology: Results for orders placed or performed during the hospital encounter of 05/25/2019  Respiratory Panel by RT PCR (Flu A&B, Covid) - Nasopharyngeal Swab     Status: None   Collection Time: 05/18/2019  2:35 AM   Specimen: Nasopharyngeal Swab  Result Value Ref  Range Status   SARS Coronavirus 2 by RT PCR NEGATIVE NEGATIVE Final    Comment: (NOTE) SARS-CoV-2 target nucleic acids are NOT DETECTED. The SARS-CoV-2 RNA is generally detectable in upper respiratoy specimens during the acute phase of infection. The lowest concentration of SARS-CoV-2 viral copies this assay can detect is 131 copies/mL. A negative result does not preclude SARS-Cov-2 infection and should not be used as the sole basis for treatment or other patient management decisions. A negative result may occur with  improper specimen collection/handling, submission of specimen other than nasopharyngeal swab, presence of viral mutation(s) within the areas targeted by this  assay, and inadequate number of viral copies (<131 copies/mL). A negative result must be combined with clinical observations, patient history, and epidemiological information. The expected result is Negative. Fact Sheet for Patients:  PinkCheek.be Fact Sheet for Healthcare Providers:  GravelBags.it This test is not yet ap proved or cleared by the [place] FDA and  has been authorized for detection and/or diagnosis of SARS-CoV-2 by FDA under an Emergency Use Authorization (EUA). This EUA will remain  in effect (meaning this test can be used) for the duration of the COVID-19 declaration under Section 564(b)(1) of the Act, 21 U.S.C. section 360bbb-3(b)(1), unless the authorization is terminated or revoked sooner.    Influenza A by PCR NEGATIVE NEGATIVE Final   Influenza B by PCR NEGATIVE NEGATIVE Final    Comment: (NOTE) The Xpert Xpress SARS-CoV-2/FLU/RSV assay is intended as an aid in  the diagnosis of influenza from Nasopharyngeal swab specimens and  should not be used as a sole basis for treatment. Nasal washings and  aspirates are unacceptable for Xpert Xpress SARS-CoV-2/FLU/RSV  testing. Fact Sheet for Patients: PinkCheek.be Fact Sheet for Healthcare Providers: GravelBags.it This test is not yet approved or cleared by the [place] FDA and  has been authorized for detection and/or diagnosis of SARS-CoV-2 by  FDA under an Emergency Use Authorization (EUA). This EUA will remain  in effect (meaning this test can be used) for the duration of the  Covid-19 declaration under Section 564(b)(1) of the Act, 21  U.S.C. section 360bbb-3(b)(1), unless the authorization is  terminated or revoked. Performed at [facility], [address] [place], [place] [address]   Culture, blood (routine x 2)     Status: None   Collection Time: 06/05/2019  2:36 AM    Specimen: BLOOD  Result Value Ref Range Status   Specimen Description BLOOD LEFT ANTECUBITAL  Final   Special Requests   Final    BOTTLES DRAWN AEROBIC AND ANAEROBIC Blood Culture adequate volume   Culture   Final    NO GROWTH 5 DAYS Performed at [facility], [address] [place], [place] [address]    Report Status 06/07/2019 FINAL  Final  Culture, blood (routine x 2)     Status: None   Collection Time: 06/14/2019  2:36 AM   Specimen: BLOOD  Result Value Ref Range Status   Specimen Description BLOOD RIGHT FOREARM  Final   Special Requests   Final    BOTTLES DRAWN AEROBIC AND ANAEROBIC Blood Culture adequate volume   Culture   Final    NO GROWTH 5 DAYS Performed at [facility], [address], [place], [place] [address]    Report Status 06/07/2019 FINAL  Final  Culture, blood (routine x 2)     Status: None   Collection Time: 06/10/2019  3:10 PM   Specimen: BLOOD  Result Value Ref Range Status  Specimen Description BLOOD A-LINE  Final   Special Requests   Final    BOTTLES DRAWN AEROBIC AND ANAEROBIC Blood Culture results may not be optimal due to an excessive volume of blood received in culture bottles   Culture   Final    NO GROWTH 5 DAYS Performed at [facility], [address] [place], [place] [address]    Report Status 06/07/2019 FINAL  Final  Culture, blood (routine x 2)     Status: None   Collection Time: 05/24/2019  3:10 PM   Specimen: BLOOD  Result Value Ref Range Status   Specimen Description BLOOD A-LINE  Final   Special Requests   Final    BOTTLES DRAWN AEROBIC AND ANAEROBIC Blood Culture results may not be optimal due to an excessive volume of blood received in culture bottles   Culture   Final    NO GROWTH 5 DAYS Performed at [facility], [address] [place], [place] [address]    Report Status 06/07/2019 FINAL  Final  MRSA PCR Screening     Status: None   Collection Time: 06/03/19  5:45 PM   Specimen:  Nasopharyngeal  Result Value Ref Range Status   MRSA by PCR NEGATIVE NEGATIVE Final    Comment:        The GeneXpert MRSA Assay (FDA approved for NASAL specimens only), is one component of a comprehensive MRSA colonization surveillance program. It is not intended to diagnose MRSA infection nor to guide or monitor treatment for MRSA infections. Performed at [facility], [address], [place], [place] [address]   Urine Culture     Status: None   Collection Time: 06/03/19  6:20 PM   Specimen: Urine, Catheterized  Result Value Ref Range Status   Specimen Description   Final    URINE, CATHETERIZED Performed at [facility], [address] [place], [place] [address]    Special Requests   Final    Immunocompromised Performed at [facility], [address], [place], [place] [address]    Culture   Final    NO GROWTH Performed at [facility], [place] [address] [place], [place] [address]    Report Status 06/05/2019 FINAL  Final  Cath Tip Culture     Status: None   Collection Time: 06/14/2019  4:32 PM   Specimen: Catheter Tip; Other  Result Value Ref Range Status   Specimen Description   Final    CATH TIP Performed at [facility], [address], [place], [place] [address]    Special Requests   Final    NONE Performed at [facility], [address], [place], [place] [address]    Culture   Final    NO GROWTH 2 DAYS Performed at [facility], [place] [address] [place], [place] [address]    Report Status 06/07/2019 FINAL  Final  Culture, respiratory (non-expectorated)     Status: None   Collection Time: 06/06/19 10:22 AM   Specimen: Tracheal Aspirate; Respiratory  Result Value Ref Range Status   Specimen Description   Final    TRACHEAL ASPIRATE Performed at [facility], [address] [place], [place] [address]    Special Requests   Final    NONE Performed at [facility], [address] [place],  [address]    Gram Stain   Final    RARE WBC PRESENT, PREDOMINANTLY PMN NO ORGANISMS SEEN Performed at [facility], [place] [address] [place], [place] [address]    Culture   Final    RARE CANDIDA ALBICANS RARE FUNGUS (MOLD) ISOLATED, PROBABLE CONTAMINANT/COLONIZER (SAPROPHYTE). CONTACT MICROBIOLOGY IF FURTHER IDENTIFICATION REQUIRED [phone].    Report Status 06/09/2019 FINAL  Final  CULTURE, BLOOD (ROUTINE X 2) w Reflex to ID Panel     Status: None (Preliminary result)   Collection Time: 06/11/19  1:09 PM   Specimen: BLOOD  Result Value Ref Range Status   Specimen Description BLOOD A-LINE DRAW  Final   Special Requests   Final    BOTTLES DRAWN AEROBIC AND ANAEROBIC Blood Culture adequate volume   Culture   Final    NO GROWTH 3 DAYS Performed at [facility], [address] [place], [place] [address]    Report Status PENDING  Incomplete  CULTURE, BLOOD (ROUTINE X 2) w Reflex to ID Panel     Status: None (Preliminary result)   Collection Time: 06/11/19  1:10 PM   Specimen: BLOOD  Result Value Ref Range Status   Specimen Description BLOOD A-LINE DRAW  Final   Special Requests   Final    BOTTLES DRAWN AEROBIC AND ANAEROBIC Blood Culture adequate volume   Culture   Final    NO GROWTH 3 DAYS Performed at [facility], [address], [place], [place] [address]    Report Status PENDING  Incomplete  Culture, respiratory (non-expectorated)     Status: None (Preliminary result)   Collection Time: 06/11/19  6:04 PM   Specimen: Tracheal Aspirate; Respiratory  Result Value Ref Range Status   Specimen Description   Final    TRACHEAL ASPIRATE Performed at [facility], [address] [place], [place] [address]    Special Requests   Final    NONE Performed at [facility], [address] [place], [place] [address]    Gram Stain   Final    MODERATE WBC PRESENT, PREDOMINANTLY PMN FEW YEAST    Culture   Final     FEW YEAST IDENTIFICATION TO FOLLOW Performed at [facility], [place] [address] [place], [place] [address]    Report Status PENDING  Incomplete    Coagulation Studies: No results for input(s): LABPROT, INR in the last 72 hours.  Imaging: MR BRAIN WO CONTRAST  Result Date: 06/13/2019 CLINICAL DATA:  Focal neuro deficit, greater than 6 hours, stroke suspected. Additional history provided: Acute ischemic stroke within cephalopathy. EXAM: MRI HEAD WITHOUT CONTRAST TECHNIQUE: Multiplanar, multiecho pulse sequences of the brain and surrounding structures were obtained without intravenous contrast. COMPARISON:  MRI/MRA head 05/30/2019 FINDINGS: Brain: Multiple sequences are mildly motion degraded. Again demonstrated are patchy acute/early subacute ischemic infarcts within the bilateral centrum semiovale, corona radiata and periventricular white matter. Infarction changes have increased in extent as compared to prior examination 05/25/2019. For instance, infarction changes in the bilateral centrum semiovale/corona radiata appear more confluent. Additionally, there are several small new infarcts within the periatrial white matter bilaterally. Corresponding T2/FLAIR hyperintensity at these sites. Redemonstrated small focus of chronic microhemorrhage within the right centrum semiovale. No midline shift or extra-axial fluid collection. Cerebral volume is normal for age. Vascular: Flow voids maintained within the proximal large arterial vessels. Skull and upper cervical spine: No focal marrow lesion Sinuses/Orbits: Visualized orbits demonstrate no acute abnormality. Mild scattered paranasal sinus mucosal thickening. Trace fluid within bilateral mastoid air cells (greater on the right). IMPRESSION: Again demonstrated are patchy acute/early subacute infarcts within the bilateral centrum semiovale,  corona radiata and periventricular white matter. These infarcts have increased in extent since prior MRI 05/18/2019,  as described. No significant mass effect or midline shift. Electronically Signed   By: Kellie Simmering DO   On: 06/13/2019 14:51   DG Chest Port 1 View  Result Date: 06/13/2019 CLINICAL DATA:  Acute respiratory failure. EXAM: PORTABLE CHEST 1 VIEW COMPARISON:  June 08, 2019. FINDINGS: Stable cardiomediastinal silhouette. Endotracheal and nasogastric tubes are unchanged in position. No pneumothorax is noted. Hypoinflation of the lungs is noted. Mildly increased bilateral lung opacities are noted concerning for atelectasis or possibly infiltrates. Bony thorax is unremarkable. Small left pleural effusion is noted. IMPRESSION: Stable support apparatus. Hypoinflation of the lungs. Mildly increased bilateral lung opacities are noted concerning for atelectasis or possibly infiltrates. Small left pleural effusion is noted. Electronically Signed   By: Marijo Conception M.D.   On: 06/13/2019 07:52    Medications:  I have reviewed the patient's current medications. Scheduled: .  stroke: mapping our early stages of recovery book   Does not apply Once  . aspirin  324 mg Per Tube Daily  . atorvastatin  80 mg Per Tube q morning - 10a  . B-complex with vitamin C  1 tablet Per Tube Daily  . calcitRIOL  0.25 mcg Per Tube Daily  . chlorhexidine gluconate (MEDLINE KIT)  15 mL Mouth Rinse BID  . Chlorhexidine Gluconate Cloth  6 each Topical Q0600  . clopidogrel  75 mg Per Tube Daily  . epoetin (EPOGEN/PROCRIT) injection  10,000 Units Intravenous Q M,W,F-HD  . feeding supplement (PRO-STAT SUGAR FREE 64)  60 mL Per Tube TID  . feeding supplement (VITAL HIGH PROTEIN)  1,000 mL Per Tube Q24H  . insulin aspart  0-9 Units Subcutaneous Q4H  . ipratropium-albuterol  3 mL Nebulization TID  . mouth rinse  15 mL Mouth Rinse 10 times per day  . pantoprazole sodium  40 mg Per Tube Daily  . senna-docusate  1 tablet Per Tube BID  . sevelamer carbonate  0.8 g Oral TID WC  . sodium chloride flush  10-40 mL Intracatheter Q12H     Assessment/Plan: 48 y.o.malewith a history of DM, HTN and ESRD admitted after an episode of syncope and seizure like activity. Patient with multiple metabolic abnormalities/infection at presentation that may have provoked seizure like activity but MRI imaging performed as well and on review shows multiple acute, small infarcts bilaterally. MRA unremarkable. Embolic etiology likely.  EEG was only significant for slowing.  Patient further declined from a respiratory standpoint requiring intubation and has had difficulty weaning.  Neurological status has declined as well.  Unclear if this is related to continued multiple medical issues or if he has had further ischemic events.  Patient on Plavix but has had some time off antiplatelet therapy due to bleeding.  MRI of the brain repeated on yesterday and reviewed.  MRI shows increase in acute infarcts in the same distribution as previous imaging of 12/19.   Family conference had with daughters/sister lasting 35 minutes and with attending concerning current condition and prognosis.  Recommendations: 1. EEG        LOS: 12 days   [name], MD Neurology [phone] 06/14/2019  11:01 AM

## 2019-06-14 NOTE — Procedures (Signed)
ELECTROENCEPHALOGRAM REPORT   Patient: Jake Gomez       Room #: IC16A-AA EEG No. ID: [id number] Age: 48 y.o.        Sex: male Referring Physician: Kasa Report Date:  06/14/2019        Interpreting Physician: [name]  History: Jake Gomez is an 48 y.o. male with altered mental status and eye deviation  Medications:  ASA, Lipitor, Plavix, Epogen, Insulin, Keppra, Renvela, Fentanyl  Conditions of Recording:  This is a 21 channel routine scalp EEG performed with bipolar and monopolar montages arranged in accordance to the international 10/20 system of electrode placement. One channel was dedicated to EKG recording.  The patient is in the intubated and sedated state.  Description:  Artifact is prominent during the recording producing a fast beta activity that is superimposed on a slow background that consists of a very low voltage polymorphic delta activity.  This is diffusely distributed and persistent throughout the recording.   No epileptiform activity is noted.  Hyperventilation and intermittent photic stimulation were not performed.   IMPRESSION: This is an abnormal EEG secondary to general background slowing.  This finding may be seen with a diffuse disturbance that is etiologically nonspecific, but may include a metabolic encephalopathy or medication effect, among other possibilities.  No epileptiform activity is noted.     [name], MD Neurology [phone] 06/14/2019, 2:32 PM

## 2019-06-14 NOTE — Plan of Care (Signed)
Pt minimally responsive to voice, appears to try to open eyes when name is called, does not follow motor commands exc flicker from left hand if it is squeezed, he does not respond to painful stimuli in any limb.  Sister to bedside this AM.

## 2019-06-14 NOTE — Progress Notes (Signed)
CRITICAL CARE PROGRESS NOTE    Name: Jake Gomez MRN: [id number] DOB: [birth year]     LOS: 38   SUBJECTIVE FINDINGS & SIGNIFICANT EVENTS   Patient description:  Mr. Celona is a 48 yo male with a history of T2DM, HTN, OSA, recent COVID-19 infection and ESRD on dialysis M/W/F.  He presented to the ED on 12/19 for generalized malaise after missing dialysis on Wednesday and Friday for a trip to [place].  He then had a CVA.    Currently experiencing acute hypoxic respiratory failure.     Lines / Drains: Hemodialysis catheter on Right IJ Hemodialysis catheter left femoral NG tube 8 mm ET tube airway CVC triple lumen  Cultures / Sepsis markers: 12/28: Tracheal aspirate demonstrates moderate WBC with PMNs and few yeast.  Reincubated for better growth.    Antibiotics: Unasyn 3 g at 200 mL/hr   Protocols / Consultants: Neurology Cardiology Nephrology  Tests / Events: 12/19: presented to the ED for malaise after missing 2 dialysis sessions.  Potassium 6.4, Creatinine 15.4.  Acute ischemic CVA, with negative MR angiogram.   12/20: CT angio chest demonstrates consolidation in the left lower lobe.  No evidence of PE.    12/29  Has become diaphoretic this AM with weaning fentanyl, and episodes of tachypnea over-riding ventilator.  Meeting in person with family  12/30- relatively unchanged with hemineglect, s/p repeat MRI -increased size brain infarcts 12/31- family meeting this am with Neurology, plan for repeat EEG.    PAST MEDICAL HISTORY   Past Medical History:  Diagnosis Date  . Diabetes mellitus without complication ([place])   . Hypertension   . Renal disorder   . Sleep apnea    can't afford CPAP     SURGICAL HISTORY   Past Surgical History:  Procedure Laterality Date  . AV FISTULA PLACEMENT Left  01/31/2019   Procedure: ARTERIOVENOUS (AV) FISTULA CREATION ( BRACHIOCEPHALIC );  Surgeon: Algernon Huxley, MD;  Location: [facility];  Service: Vascular;  Laterality: Left;  . DIALYSIS/PERMA CATHETER INSERTION N/A 07/19/2018   Procedure: DIALYSIS/PERMA CATHETER INSERTION;  Surgeon: Algernon Huxley, MD;  Location: [facility];  Service: Cardiovascular;  Laterality: N/A;  . DIALYSIS/PERMA CATHETER INSERTION N/A 09/13/2018   Procedure: DIALYSIS/PERMA CATHETER INSERTION;  Surgeon: Katha Cabal, MD;  Location: [facility];  Service: Cardiovascular;  Laterality: N/A;  . DIALYSIS/PERMA CATHETER REMOVAL N/A 06/13/2019   Procedure: DIALYSIS/PERMA CATHETER REMOVAL, temporary dialysis catheter placement;  Surgeon: Algernon Huxley, MD;  Location: [facility];  Service: Cardiovascular;  Laterality: N/A;  . peritonal dialysis    . REMOVAL OF A DIALYSIS CATHETER N/A 07/17/2018   Procedure: REMOVAL OF A DIALYSIS CATHETER;  Surgeon: Algernon Huxley, MD;  Location: [facility];  Service: Vascular;  Laterality: N/A;     FAMILY HISTORY   Family History  Problem Relation Age of Onset  . Thyroid disease Mother   . Stroke Father   . Cancer Father   . Diabetes Father      SOCIAL HISTORY   Social History   Tobacco Use  . Smoking status: Former Smoker    Quit date: 11/14/2009    Years since quitting: 9.5  . Smokeless tobacco: Former Systems developer    Quit date: 07/16/2009  Substance Use Topics  . Alcohol use: No  . Drug use: Not Currently    Comment: history 10 years ago   No urine production     MEDICATIONS   Current Medication:  Current Facility-Administered Medications:  .   stroke: mapping our early stages of recovery book, , Does not apply, Once, Sharion Settler, NP .  0.9 %  sodium chloride infusion, 250 mL, Intravenous, Continuous, Wyvonnia Dusky, MD, Last Rate: 5 mL/hr at 06/14/19 0600, Rate Verify at 06/14/19 0600 .  acetaminophen (TYLENOL) tablet 650 mg, 650 mg, Per Tube, Q6H PRN  **OR** acetaminophen (TYLENOL) suppository 650 mg, 650 mg, Rectal, Q6H PRN, Gerald Dexter, RPH .  aspirin chewable tablet 324 mg, 324 mg, Per Tube, Daily, Kasa, Kurian, MD, 324 mg at 06/12/19 1005 .  atorvastatin (LIPITOR) tablet 80 mg, 80 mg, Per Tube, q morning - 10a, Gerald Dexter, RPH, 80 mg at 06/12/19 1716 .  B-complex with vitamin C tablet 1 tablet, 1 tablet, Per Tube, Daily, Flora Lipps, MD, 1 tablet at 06/13/19 0956 .  calcitRIOL (ROCALTROL) 1 MCG/ML solution 0.25 mcg, 0.25 mcg, Per Tube, Daily, Murlean Iba, MD, 0.25 mcg at 06/13/19 0956 .  chlorhexidine gluconate (MEDLINE KIT) (PERIDEX) 0.12 % solution 15 mL, 15 mL, Mouth Rinse, BID, Kasa, Kurian, MD, 15 mL at 06/13/19 1953 .  Chlorhexidine Gluconate Cloth 2 % PADS 6 each, 6 each, Topical, Q0600, Liana Gerold, MD, 6 each at 06/14/19 [phone] .  clopidogrel (PLAVIX) tablet 75 mg, 75 mg, Per Tube, Daily, Ottie Glazier, MD, 75 mg at 06/12/19 1005 .  epoetin alfa (EPOGEN) injection 10,000 Units, 10,000 Units, Intravenous, Q M,W,F-HD, Murlean Iba, MD, 10,000 Units at 06/11/19 1413 .  feeding supplement (PRO-STAT SUGAR FREE 64) liquid 60 mL, 60 mL, Per Tube, TID, Flora Lipps, MD, 60 mL at 06/13/19 2145 .  feeding supplement (VITAL HIGH PROTEIN) liquid 1,000 mL, 1,000 mL, Per Tube, Q24H, Kasa, Kurian, MD, 1,000 mL at 06/13/19 0205 .  fentaNYL 2536mg in NS 2512m(1010mml) infusion-PREMIX, 0-400 mcg/hr, Intravenous, Continuous, Kasa, Kurian, MD, Last Rate: 27.5 mL/hr at 06/14/19 0600, 275 mcg/hr at 06/14/19 0600 .  heparin injection 1,000-6,000 Units, 1,000-6,000 Units, CRRT, PRN, Kasa, Kurian, MD .  insulin aspart (novoLOG) injection 0-9 Units, 0-9 Units, Subcutaneous, Q4H, KeeDarel Hong NP, 2 Units at 06/14/19 [phone] ipratropium-albuterol (DUONEB) 0.5-2.5 (3) MG/3ML nebulizer solution 3 mL, 3 mL, Nebulization, Q6H PRN, WilWyvonnia DuskyD .  ipratropium-albuterol (DUONEB) 0.5-2.5 (3) MG/3ML nebulizer solution  3 mL, 3 mL, Nebulization, TID, KasFlora LippsD, 3 mL at 06/14/19 0734 .  labetalol (NORMODYNE) injection 10 mg, 10 mg, Intravenous, Q6H PRN, AleLanney Ginsuad, MD .  levETIRAcetam (KEPPRA) IVPB 500 mg/100 mL premix, 500 mg, Intravenous, Q12H, KeeBradly BienenstockP, Stopped at 06/14/19 0410 .  MEDLINE mouth rinse, 15 mL, Mouth Rinse, 10 times per day, KasFlora LippsD, 15 mL at 06/14/19 [id number] midazolam (VERSED) injection 2 mg, 2 mg, Intravenous, Q4H PRN, KasFlora LippsD, 2 mg at 06/14/19 [id number] ondansetron (ZOFRAN) tablet 4 mg, 4 mg, Per Tube, Q6H PRN **OR** ondansetron (ZOFRAN) injection 4 mg, 4 mg, Intravenous, Q6H PRN, MorGerald DexterPH .  pantoprazole sodium (PROTONIX) 40 mg/20 mL oral suspension 40 mg, 40 mg, Per Tube, Daily, KasFlora LippsD, 40 mg at 06/13/19 0955 .  senna-docusate (Senokot-S) tablet 1 tablet, 1 tablet, Per Tube, BID, KasFlora LippsD, 1 tablet at 06/13/19 2145 .  sevelamer carbonate (RENVELA) packet 0.8 g, 0.8 g, Oral, TID WC, Singh, Harmeet, MD, 0.8 g at 06/13/19 1248 .  sodium chloride flush (NS) 0.9 % injection 10-40 mL, 10-40 mL, Intracatheter, Q12H, KasFlora LippsD, 10  mL at 06/13/19 2145 .  sodium chloride flush (NS) 0.9 % injection 10-40 mL, 10-40 mL, Intracatheter, PRN, Flora Lipps, MD .  vecuronium (NORCURON) injection 10 mg, 10 mg, Intravenous, Q1H PRN, Flora Lipps, MD, 10 mg at 06/12/19 1238    ALLERGIES   Patient has no known allergies.    REVIEW OF SYSTEMS   Unable to complete due to severity of patient illness  PHYSICAL EXAMINATION   Vital Signs: Temp:  [98.2 F (36.8 C)-99.7 F (37.6 C)] 98.5 F (36.9 C) (12/31 0400) Pulse Rate:  [79-119] 85 (12/31 0600) Resp:  [13-26] 16 (12/31 0600) BP: (96-235)/(44-72) 121/55 (12/31 0600) SpO2:  [86 %-100 %] 99 % (12/31 0600) FiO2 (%):  [24 %] 24 % (12/31 0740)  GENERAL: Patient sedated, intubated and on vent.  He opens eyes in response to verbal stimulation.   HEAD: Normocephalic,  atraumatic.  EYES: Pupils equal, round, reactive to light.  Eyes deviated to up and to the left.  No scleral icterus.  MOUTH: Moist mucosal membrane. NECK: Supple. No thyromegaly. No nodules. No JVD.  PULMONARY: Decreased breath sounds in Left lower lobe.  Patient exhibiting episodes of tachypnea overriding the vent.     CARDIOVASCULAR: S1 and S2. Regular rate and rhythm. No murmurs, rubs, or gallops.  GASTROINTESTINAL: Soft, nontender, non-distended. No masses. Positive bowel sounds. No hepatosplenomegaly.  MUSCULOSKELETAL: No swelling, clubbing, or edema.  NEUROLOGIC: Mild distress due to acute illness. Non purposeful movement of bilateral arms, minimal nonpurposeful movements of bilateral legs.  Does not respond to commands.    SKIN:intact,warm,diaphoretic   PERTINENT DATA     Infusions: . sodium chloride 5 mL/hr at 06/14/19 0600  . fentaNYL infusion INTRAVENOUS 275 mcg/hr (06/14/19 0600)  . levETIRAcetam Stopped (06/14/19 0410)   Scheduled Medications: .  stroke: mapping our early stages of recovery book   Does not apply Once  . aspirin  324 mg Per Tube Daily  . atorvastatin  80 mg Per Tube q morning - 10a  . B-complex with vitamin C  1 tablet Per Tube Daily  . calcitRIOL  0.25 mcg Per Tube Daily  . chlorhexidine gluconate (MEDLINE KIT)  15 mL Mouth Rinse BID  . Chlorhexidine Gluconate Cloth  6 each Topical Q0600  . clopidogrel  75 mg Per Tube Daily  . epoetin (EPOGEN/PROCRIT) injection  10,000 Units Intravenous Q M,W,F-HD  . feeding supplement (PRO-STAT SUGAR FREE 64)  60 mL Per Tube TID  . feeding supplement (VITAL HIGH PROTEIN)  1,000 mL Per Tube Q24H  . insulin aspart  0-9 Units Subcutaneous Q4H  . ipratropium-albuterol  3 mL Nebulization TID  . mouth rinse  15 mL Mouth Rinse 10 times per day  . pantoprazole sodium  40 mg Per Tube Daily  . senna-docusate  1 tablet Per Tube BID  . sevelamer carbonate  0.8 g Oral TID WC  . sodium chloride flush  10-40 mL Intracatheter  Q12H   PRN Medications: acetaminophen **OR** acetaminophen, heparin, ipratropium-albuterol, labetalol, midazolam, ondansetron **OR** ondansetron (ZOFRAN) IV, sodium chloride flush, vecuronium Hemodynamic parameters:   Intake/Output: 12/30 0701 - 12/31 0700 In: 1385.5 [I.V.:715.5; NG/GT:570; IV Piggyback:100] Out: 1000   Ventilator  Settings: Vent Mode: PCV FiO2 (%):  [24 %] 24 % Set Rate:  [16 bmp] 16 bmp PEEP:  [5 cmH20] [place] Pressure:  [14 cmH20] 14 cmH20   Other Labs:  LAB RESULTS:  Basic Metabolic Panel: Recent Labs  Lab 06/10/19 0540 06/11/19 0551 06/12/19 0513 06/13/19 0453 06/14/19 0454  NA 135 136 141 140 141  K 4.0 4.2 3.8 4.2 3.9  CL 97* 96* 97* 96* 98  CO2 23 21* _0 GLUCOSE 216* 220* 217* 232* 179*  BUN 84* 118* 96* 109* 87*  CREATININE 6.34* 8.32* 7.17* 8.94* 7.07*  CALCIUM 7.4* 7.2* 7.6* 7.3* 7.4*  MG 2.5*  --   --  2.6*  --   PHOS 7.9*  --   --   --   --    Liver Function Tests: No results for input(s): AST, ALT, ALKPHOS, BILITOT, PROT, ALBUMIN in the last 168 hours. No results for input(s): LIPASE, AMYLASE in the last 168 hours. No results for input(s): AMMONIA in the last 168 hours. CBC: Recent Labs  Lab 06/09/19 0430 06/10/19 0540 06/11/19 0551 06/12/19 0513 06/13/19 0453 06/14/19 0454  WBC 8.7 10.1 8.9 9.2 11.2* 8.9  NEUTROABS 4.5  --  5.1 6.0  --   --   HGB 7.9* 8.4* 7.9* 8.8* 8.2* 7.1*  HCT 24.4* 26.7* 25.2* 28.6* 26.2* 24.4*  MCV 99.2 104.3* 100.0 104.4* 100.4* 105.2*  PLT 140* 191 234 303 291 308   Cardiac Enzymes: No results for input(s): CKTOTAL, CKMB, CKMBINDEX, TROPONINI in the last 168 hours. BNP: Invalid input(s): POCBNP CBG: Recent Labs  Lab 06/13/19 1546 06/13/19 1942 06/14/19 0014 06/14/19 0359 06/14/19 0728  GLUCAP 207* 164* 185* 172* 194*     IMAGING RESULTS:  Imaging: MR BRAIN WO CONTRAST  Result Date: 06/13/2019 CLINICAL DATA:  Focal neuro deficit, greater than 6 hours, stroke  suspected. Additional history provided: Acute ischemic stroke within cephalopathy. EXAM: MRI HEAD WITHOUT CONTRAST TECHNIQUE: Multiplanar, multiecho pulse sequences of the brain and surrounding structures were obtained without intravenous contrast. COMPARISON:  MRI/MRA head 06/06/2019 FINDINGS: Brain: Multiple sequences are mildly motion degraded. Again demonstrated are patchy acute/early subacute ischemic infarcts within the bilateral centrum semiovale, corona radiata and periventricular white matter. Infarction changes have increased in extent as compared to prior examination 05/15/2019. For instance, infarction changes in the bilateral centrum semiovale/corona radiata appear more confluent. Additionally, there are several small new infarcts within the periatrial white matter bilaterally. Corresponding T2/FLAIR hyperintensity at these sites. Redemonstrated small focus of chronic microhemorrhage within the right centrum semiovale. No midline shift or extra-axial fluid collection. Cerebral volume is normal for age. Vascular: Flow voids maintained within the proximal large arterial vessels. Skull and upper cervical spine: No focal marrow lesion Sinuses/Orbits: Visualized orbits demonstrate no acute abnormality. Mild scattered paranasal sinus mucosal thickening. Trace fluid within bilateral mastoid air cells (greater on the right). IMPRESSION: Again demonstrated are patchy acute/early subacute infarcts within the bilateral centrum semiovale, corona radiata and periventricular white matter. These infarcts have increased in extent since prior MRI 05/28/2019, as described. No significant mass effect or midline shift. Electronically Signed   By: Kellie Simmering DO   On: 06/13/2019 14:51   DG Chest Port 1 View  Result Date: 06/13/2019 CLINICAL DATA:  Acute respiratory failure. EXAM: PORTABLE CHEST 1 VIEW COMPARISON:  June 08, 2019. FINDINGS: Stable cardiomediastinal silhouette. Endotracheal and nasogastric tubes are  unchanged in position. No pneumothorax is noted. Hypoinflation of the lungs is noted. Mildly increased bilateral lung opacities are noted concerning for atelectasis or possibly infiltrates. Bony thorax is unremarkable. Small left pleural effusion is noted. IMPRESSION: Stable support apparatus. Hypoinflation of the lungs. Mildly increased bilateral lung opacities are noted concerning for atelectasis or possibly infiltrates. Small left pleural effusion is noted. Electronically Signed   By: Marijo Conception  M.D.   On: 06/13/2019 07:52   _0 @   ASSESSMENT AND PLAN    -Multidisciplinary rounds held today  Acute Hypoxic Respiratory Failure with Left Lower Lobe Pneumonia - Left lower lobe pneumonia demonstrated on CT chest 12/20- continue treatment with unasyn - patient previously positive for pseudomonas in 07/2018 on peritoneal catheter.   -continue Full MV support -continue Bronchodilator Therapy -Wean Fio2 and PEEP as tolerated -will perform SAT/SBT when respiratory parameters are met  Acute ischemic CVA - consulting with neurology - MRI head on 12/20 demonstrated patchy multifocal acute ischemic infarcts. MRA head on 12/20 demonstrated no large vessel occlusion, stenosis or acute vascular abnormality.   - EEG consistent with no seizures, diffuse encephalopathy - Carotid ultrasound demonstrates mild occlusion, not clinically significant - ICU telemonitoring - Tachypnea overriding vent, fixed gaze up and to left, diaphoretic with weaning off fentanyl - Spoke with patient's sister, plan for family meeting to discuss goals of care   Renal Failure-ESRD -follow chem 7 -follow UO -continue Foley Catheter-assess need daily - last hemodialysis yesterday -Nephrology consulting on case  ID -continue IV abx as prescibed: unasyn -follow up cultures  GI/Nutrition GI PROPHYLAXIS as indicated DIET-->TF's as tolerated  Constipation protocol as indicated  ENDO - ICU  hypoglycemic\Hyperglycemia protocol -check FSBS per protocol   ELECTROLYTES -follow labs as needed -replace as needed -pharmacy consultation   DVT/GI PRX ordered -SCDs  TRANSFUSIONS AS NEEDED MONITOR FSBS ASSESS the need for LABS as needed   Critical care provider statement:    Critical care time (minutes):  35   Critical care time was exclusive of:  Separately billable procedures and treating other patients   Critical care was necessary to treat or prevent imminent or life-threatening deterioration of the following conditions:  acute respiratory failure, acute CVA,    Critical care was time spent personally by me on the following activities:  Development of treatment plan with patient or surrogate, discussions with consultants, evaluation of patient's response to treatment, examination of patient, obtaining history from patient or surrogate, ordering and performing treatments and interventions, ordering and review of laboratory studies and re-evaluation of patient's condition.  I assumed direction of critical care for this patient from another provider in my specialty: no    This document was prepared using Dragon voice recognition software and may include unintentional dictation errors.    Ottie Glazier, M.D.  Division of [place]

## 2019-06-14 NOTE — Progress Notes (Signed)
eeg ompleted

## 2019-06-14 NOTE — Progress Notes (Signed)
Pt started on precedex due to increased agitation, HR and BP. Precedex seems to be helping with increased agitation also had to start on levo because of soft bp's MAPs in the 50's, Darlyn Chamber NP aware.

## 2019-06-14 NOTE — Progress Notes (Signed)
Went in to pt's room in response to vent alarm. Pt's eyes were open and had upward left deviated gaze, L arm raised and trembling, was diaphoretic and became hypertensive and tachycardic. NP informed and orders for ativan and keppra given. Pt's symptoms improved post medication .

## 2019-06-15 ENCOUNTER — Inpatient Hospital Stay: Payer: Medicare Other

## 2019-06-15 LAB — CBC WITH DIFFERENTIAL/PLATELET
Abs Immature Granulocytes: 0.12 10*3/uL — ABNORMAL HIGH (ref 0.00–0.07)
Basophils Absolute: 0 10*3/uL (ref 0.0–0.1)
Basophils Relative: 0 %
Eosinophils Absolute: 0.3 10*3/uL (ref 0.0–0.5)
Eosinophils Relative: 3 %
HCT: 25.8 % — ABNORMAL LOW (ref 39.0–52.0)
Hemoglobin: 8 g/dL — ABNORMAL LOW (ref 13.0–17.0)
Immature Granulocytes: 1 %
Lymphocytes Relative: 10 %
Lymphs Abs: 1 10*3/uL (ref 0.7–4.0)
MCH: 31 pg (ref 26.0–34.0)
MCHC: 31 g/dL (ref 30.0–36.0)
MCV: 100 fL (ref 80.0–100.0)
Monocytes Absolute: 0.7 10*3/uL (ref 0.1–1.0)
Monocytes Relative: 7 %
Neutro Abs: 7.9 10*3/uL — ABNORMAL HIGH (ref 1.7–7.7)
Neutrophils Relative %: 79 %
Platelets: 336 10*3/uL (ref 150–400)
RBC: 2.58 MIL/uL — ABNORMAL LOW (ref 4.22–5.81)
RDW: 14.6 % (ref 11.5–15.5)
WBC: 10 10*3/uL (ref 4.0–10.5)
nRBC: 0 % (ref 0.0–0.2)

## 2019-06-15 LAB — GLUCOSE, CAPILLARY
Glucose-Capillary: 147 mg/dL — ABNORMAL HIGH (ref 70–99)
Glucose-Capillary: 160 mg/dL — ABNORMAL HIGH (ref 70–99)
Glucose-Capillary: 182 mg/dL — ABNORMAL HIGH (ref 70–99)
Glucose-Capillary: 194 mg/dL — ABNORMAL HIGH (ref 70–99)
Glucose-Capillary: 210 mg/dL — ABNORMAL HIGH (ref 70–99)
Glucose-Capillary: 214 mg/dL — ABNORMAL HIGH (ref 70–99)
Glucose-Capillary: 241 mg/dL — ABNORMAL HIGH (ref 70–99)

## 2019-06-15 LAB — BASIC METABOLIC PANEL
Anion gap: 11 (ref 5–15)
BUN: 61 mg/dL — ABNORMAL HIGH (ref 6–20)
CO2: 29 mmol/L (ref 22–32)
Calcium: 8 mg/dL — ABNORMAL LOW (ref 8.9–10.3)
Chloride: 97 mmol/L — ABNORMAL LOW (ref 98–111)
Creatinine, Ser: 4.94 mg/dL — ABNORMAL HIGH (ref 0.61–1.24)
GFR calc Af Amer: 15 mL/min — ABNORMAL LOW (ref >60)
GFR calc non Af Amer: 13 mL/min — ABNORMAL LOW (ref >60)
Glucose, Bld: 149 mg/dL — ABNORMAL HIGH (ref 70–99)
Potassium: 3.2 mmol/L — ABNORMAL LOW (ref 3.5–5.1)
Sodium: 137 mmol/L (ref 135–145)

## 2019-06-15 MED ORDER — STERILE WATER FOR INJECTION IJ SOLN
INTRAMUSCULAR | Status: AC
  Filled 2019-06-15: qty 10

## 2019-06-15 MED ORDER — FENTANYL BOLUS VIA INFUSION
50.0000 ug | INTRAVENOUS | Status: DC | PRN
  Administered 2019-06-22: 50 ug via INTRAVENOUS
  Filled 2019-06-15: qty 50

## 2019-06-15 MED ORDER — MIDAZOLAM HCL 2 MG/2ML IJ SOLN
2.0000 mg | INTRAMUSCULAR | Status: DC | PRN
  Administered 2019-06-15 – 2019-06-23 (×6): 2 mg via INTRAVENOUS
  Filled 2019-06-15 (×6): qty 2

## 2019-06-15 NOTE — Plan of Care (Signed)
  Problem: Clinical Measurements: Goal: Ability to maintain clinical measurements within normal limits will improve Outcome: Progressing Goal: Will remain free from infection Outcome: Progressing Goal: Diagnostic test results will improve Outcome: Progressing Goal: Respiratory complications will improve Outcome: Progressing Goal: Cardiovascular complication will be avoided Outcome: Progressing  Pt is currently receiving HD Tx Pt is intubated and continues to receive 0.3mcg/kg/hr dexmedetomidine, 231mcg/hr Fentanyl, Noreepinephrine 4mcg/min and 5% Dextrose 55ml/hr as PO. Pt's BP continues to be WDL throughout the Tx. Will keep monitoring

## 2019-06-15 NOTE — Plan of Care (Signed)
  Problem: Health Behavior/Discharge Planning: Goal: Ability to manage health-related needs will improve Outcome: Progressing   Problem: Clinical Measurements: Goal: Ability to maintain clinical measurements within normal limits will improve Outcome: Progressing Goal: Will remain free from infection Outcome: Progressing Goal: Diagnostic test results will improve Outcome: Progressing Goal: Respiratory complications will improve Outcome: Progressing Goal: Cardiovascular complication will be avoided Outcome: Progressing   Problem: Activity: Goal: Risk for activity intolerance will decrease Outcome: Progressing   Problem: Coping: Goal: Level of anxiety will decrease Outcome: Progressing   Problem: Elimination: Goal: Will not experience complications related to bowel motility Outcome: Progressing Goal: Will not experience complications related to urinary retention Outcome: Progressing   Problem: Pain Managment: Goal: General experience of comfort will improve Outcome: Progressing   Problem: Safety: Goal: Ability to remain free from injury will improve Outcome: Progressing   Problem: Skin Integrity: Goal: Risk for impaired skin integrity will decrease Outcome: Progressing   Problem: Education: Goal: Knowledge of disease or condition will improve Outcome: Progressing Goal: Knowledge of secondary prevention will improve Outcome: Progressing Goal: Knowledge of patient specific risk factors addressed and post discharge goals established will improve Outcome: Progressing   Problem: Coping: Goal: Will identify appropriate support needs Outcome: Progressing   Problem: Health Behavior/Discharge Planning: Goal: Ability to manage health-related needs will improve Outcome: Progressing

## 2019-06-15 NOTE — Progress Notes (Signed)
Attempted sedation decrease. Mews increase based on LOC presented during that sedation holiday. Patient unable to follow commands or respond outside of Painful stimulus. MD already aware as this is patient's base line due to injury. Restarted sedation and gave PRN dose and patient's MEWS reduced back to Green. No new orders. Will continue to monitor.

## 2019-06-15 NOTE — Progress Notes (Signed)
Subjective: Remains intubated.  On Precedex due to agitation.    Objective: Current vital signs: BP (!) 126/45 (BP Location: Right Arm)   Pulse 74   Temp 99 F (37.2 C) (Oral)   Resp 17   Ht 5' 7.99" (1.727 m)   Wt 122.7 kg   SpO2 100%   BMI 41.14 kg/m  Vital signs in last 24 hours: Temp:  [98.1 F (36.7 C)-99.4 F (37.4 C)] 99 F (37.2 C) (01/01 0600) Pulse Rate:  [74-116] 74 (01/01 0600) Resp:  [12-19] 17 (01/01 0600) BP: (92-202)/(37-121) 126/45 (01/01 0600) SpO2:  [92 %-100 %] 100 % (01/01 0600) FiO2 (%):  [24 %-40 %] 40 % (01/01 0305)  Intake/Output from previous day: 12/31 0701 - 01/01 0700 In: 1713 [I.V.:833; NG/GT:680; IV Piggyback:200] Out: 275 [Stool:275] Intake/Output this shift: No intake/output data recorded. Nutritional status:  Diet Order    None      Neurologic Exam: Mental Status: Intubated and sedated.  Does not respond to sternal rub.  Does not follow commands.   Cranial Nerves: II: Pupils 38m and reactive.  Does not blink to bilateral confrontation III,IV, VI: No gaze deviation V,VII: Corneals intact bilaterally VIII: unable to test IX,X: unable to test XI: unable to test XII: unable to test Motor: No spontaneous limb movement noted Sensory: Does not respond to noxious stimuli in any extremity   Lab Results: Basic Metabolic Panel: Recent Labs  Lab 06/10/19 0540 06/11/19 0551 06/12/19 0513 06/13/19 0453 06/14/19 0454  NA 135 136 141 140 141  K 4.0 4.2 3.8 4.2 3.9  CL 97* 96* 97* 96* 98  CO2 23 21* '26 25 27  '$ GLUCOSE 216* 220* 217* 232* 179*  BUN 84* 118* 96* 109* 87*  CREATININE 6.34* 8.32* 7.17* 8.94* 7.07*  CALCIUM 7.4* 7.2* 7.6* 7.3* 7.4*  MG 2.5*  --   --  2.6*  --   PHOS 7.9*  --   --   --   --     Liver Function Tests: No results for input(s): AST, ALT, ALKPHOS, BILITOT, PROT, ALBUMIN in the last 168 hours. No results for input(s): LIPASE, AMYLASE in the last 168 hours. No results for input(s): AMMONIA in the last  168 hours.  CBC: Recent Labs  Lab 06/09/19 0430 06/10/19 0540 06/11/19 0551 06/12/19 0513 06/13/19 0453 06/14/19 0454  WBC 8.7 10.1 8.9 9.2 11.2* 8.9  NEUTROABS 4.5  --  5.1 6.0  --   --   HGB 7.9* 8.4* 7.9* 8.8* 8.2* 7.1*  HCT 24.4* 26.7* 25.2* 28.6* 26.2* 24.4*  MCV 99.2 104.3* 100.0 104.4* 100.4* 105.2*  PLT 140* 191 234 303 291 308    Cardiac Enzymes: No results for input(s): CKTOTAL, CKMB, CKMBINDEX, TROPONINI in the last 168 hours.  Lipid Panel: No results for input(s): CHOL, TRIG, HDL, CHOLHDL, VLDL, LDLCALC in the last 168 hours.  CBG: Recent Labs  Lab 06/14/19 1644 06/14/19 1937 06/15/19 0023 06/15/19 0327 06/15/19 0753  GLUCAP 165* 201* 214* 241* 210*    Microbiology: Results for orders placed or performed during the hospital encounter of 06/11/2019  Respiratory Panel by RT PCR (Flu A&B, Covid) - Nasopharyngeal Swab     Status: None   Collection Time: 05/28/2019  2:35 AM   Specimen: Nasopharyngeal Swab  Result Value Ref Range Status   SARS Coronavirus 2 by RT PCR NEGATIVE NEGATIVE Final    Comment: (NOTE) SARS-CoV-2 target nucleic acids are NOT DETECTED. The SARS-CoV-2 RNA is generally detectable in upper respiratoy specimens  during the acute phase of infection. The lowest concentration of SARS-CoV-2 viral copies this assay can detect is 131 copies/mL. A negative result does not preclude SARS-Cov-2 infection and should not be used as the sole basis for treatment or other patient management decisions. A negative result may occur with  improper specimen collection/handling, submission of specimen other than nasopharyngeal swab, presence of viral mutation(s) within the areas targeted by this assay, and inadequate number of viral copies (<131 copies/mL). A negative result must be combined with clinical observations, patient history, and epidemiological information. The expected result is Negative. Fact Sheet for Patients:   PinkCheek.be Fact Sheet for Healthcare Providers:  GravelBags.it This test is not yet ap proved or cleared by the [place] FDA and  has been authorized for detection and/or diagnosis of SARS-CoV-2 by FDA under an Emergency Use Authorization (EUA). This EUA will remain  in effect (meaning this test can be used) for the duration of the COVID-19 declaration under Section 564(b)(1) of the Act, 21 U.S.C. section 360bbb-3(b)(1), unless the authorization is terminated or revoked sooner.    Influenza A by PCR NEGATIVE NEGATIVE Final   Influenza B by PCR NEGATIVE NEGATIVE Final    Comment: (NOTE) The Xpert Xpress SARS-CoV-2/FLU/RSV assay is intended as an aid in  the diagnosis of influenza from Nasopharyngeal swab specimens and  should not be used as a sole basis for treatment. Nasal washings and  aspirates are unacceptable for Xpert Xpress SARS-CoV-2/FLU/RSV  testing. Fact Sheet for Patients: PinkCheek.be Fact Sheet for Healthcare Providers: GravelBags.it This test is not yet approved or cleared by the [place] FDA and  has been authorized for detection and/or diagnosis of SARS-CoV-2 by  FDA under an Emergency Use Authorization (EUA). This EUA will remain  in effect (meaning this test can be used) for the duration of the  Covid-19 declaration under Section 564(b)(1) of the Act, 21  U.S.C. section 360bbb-3(b)(1), unless the authorization is  terminated or revoked. Performed at [facility], [address] [place], [place] [address]   Culture, blood (routine x 2)     Status: None   Collection Time: 05/19/2019  2:36 AM   Specimen: BLOOD  Result Value Ref Range Status   Specimen Description BLOOD LEFT ANTECUBITAL  Final   Special Requests   Final    BOTTLES DRAWN AEROBIC AND ANAEROBIC Blood Culture adequate volume   Culture   Final    NO GROWTH 5  DAYS Performed at [facility], [address] [place], [place] [address]    Report Status 06/07/2019 FINAL  Final  Culture, blood (routine x 2)     Status: None   Collection Time: 06/13/2019  2:36 AM   Specimen: BLOOD  Result Value Ref Range Status   Specimen Description BLOOD RIGHT FOREARM  Final   Special Requests   Final    BOTTLES DRAWN AEROBIC AND ANAEROBIC Blood Culture adequate volume   Culture   Final    NO GROWTH 5 DAYS Performed at [facility], [address], [place], [place] [address]    Report Status 06/07/2019 FINAL  Final  Culture, blood (routine x 2)     Status: None   Collection Time: 06/14/2019  3:10 PM   Specimen: BLOOD  Result Value Ref Range Status   Specimen Description BLOOD A-LINE  Final   Special Requests   Final    BOTTLES DRAWN AEROBIC AND ANAEROBIC Blood Culture results may not be optimal due to an excessive volume  of blood received in culture bottles   Culture   Final    NO GROWTH 5 DAYS Performed at [facility] [place] [facility], [address] [place], [place] [address]    Report Status 06/07/2019 FINAL  Final  Culture, blood (routine x 2)     Status: None   Collection Time: 05/31/2019  3:10 PM   Specimen: BLOOD  Result Value Ref Range Status   Specimen Description BLOOD A-LINE  Final   Special Requests   Final    BOTTLES DRAWN AEROBIC AND ANAEROBIC Blood Culture results may not be optimal due to an excessive volume of blood received in culture bottles   Culture   Final    NO GROWTH 5 DAYS Performed at [facility], [address] [place], [place] [address]    Report Status 06/07/2019 FINAL  Final  MRSA PCR Screening     Status: None   Collection Time: 06/03/19  5:45 PM   Specimen: Nasopharyngeal  Result Value Ref Range Status   MRSA by PCR NEGATIVE NEGATIVE Final    Comment:        The GeneXpert MRSA Assay (FDA approved for NASAL specimens only), is one component of a comprehensive MRSA colonization surveillance  program. It is not intended to diagnose MRSA infection nor to guide or monitor treatment for MRSA infections. Performed at [facility], [address], [place], [place] [address]   Urine Culture     Status: None   Collection Time: 06/03/19  6:20 PM   Specimen: Urine, Catheterized  Result Value Ref Range Status   Specimen Description   Final    URINE, CATHETERIZED Performed at [facility], [address] [place], [place] [address]    Special Requests   Final    Immunocompromised Performed at [facility], [address], [place], [place] [address]    Culture   Final    NO GROWTH Performed at [facility], [place] [address], [place], [place] [address]    Report Status 06/05/2019 FINAL  Final  Cath Tip Culture     Status: None   Collection Time: 05/26/2019  4:32 PM   Specimen: Catheter Tip; Other  Result Value Ref Range Status   Specimen Description   Final    CATH TIP Performed at [facility], [address] [place], [place] [address]    Special Requests   Final    NONE Performed at [facility], [address], [place], [place] [address]    Culture   Final    NO GROWTH 2 DAYS Performed at [facility], [place] [address] [place], [place] [address]    Report Status 06/07/2019 FINAL  Final  Culture, respiratory (non-expectorated)     Status: None   Collection Time: 06/06/19 10:22 AM   Specimen: Tracheal Aspirate; Respiratory  Result Value Ref Range Status   Specimen Description   Final    TRACHEAL ASPIRATE Performed at [facility], [address], [place], [place] [address]    Special Requests   Final    NONE Performed at [facility], [address] [place], [place] [address]    Gram Stain   Final    RARE WBC PRESENT, PREDOMINANTLY PMN NO ORGANISMS SEEN Performed at [facility], [place] [address], [place],  [address]    Culture   Final    RARE CANDIDA ALBICANS RARE  FUNGUS (MOLD) ISOLATED, PROBABLE CONTAMINANT/COLONIZER (SAPROPHYTE). CONTACT MICROBIOLOGY IF  FURTHER IDENTIFICATION REQUIRED [phone].    Report Status 06/09/2019 FINAL  Final  CULTURE, BLOOD (ROUTINE X 2) w Reflex to ID Panel     Status: None (Preliminary result)   Collection Time: 06/11/19  1:09 PM   Specimen: BLOOD  Result Value Ref Range Status   Specimen Description BLOOD A-LINE DRAW  Final   Special Requests   Final    BOTTLES DRAWN AEROBIC AND ANAEROBIC Blood Culture adequate volume   Culture   Final    NO GROWTH 4 DAYS Performed at [facility], [address] [place], [place] [address]    Report Status PENDING  Incomplete  CULTURE, BLOOD (ROUTINE X 2) w Reflex to ID Panel     Status: None (Preliminary result)   Collection Time: 06/11/19  1:10 PM   Specimen: BLOOD  Result Value Ref Range Status   Specimen Description BLOOD A-LINE DRAW  Final   Special Requests   Final    BOTTLES DRAWN AEROBIC AND ANAEROBIC Blood Culture adequate volume   Culture   Final    NO GROWTH 4 DAYS Performed at [facility], [address] [place], [place] [address]    Report Status PENDING  Incomplete  Culture, respiratory (non-expectorated)     Status: None   Collection Time: 06/11/19  6:04 PM   Specimen: Tracheal Aspirate; Respiratory  Result Value Ref Range Status   Specimen Description   Final    TRACHEAL ASPIRATE Performed at [facility], [address], [place], [place] [address]    Special Requests   Final    NONE Performed at [facility], [address] [place], [place] [address]    Gram Stain   Final    MODERATE WBC PRESENT, PREDOMINANTLY PMN FEW YEAST Performed at [facility], [place] [address], [place], [place] [address]    Culture FEW CANDIDA ALBICANS  Final   Report Status 06/14/2019 FINAL  Final    Coagulation Studies: No results for input(s): LABPROT, INR in the last 72 hours.  Imaging: EEG  Result Date:  06/14/2019 [name], MD     06/14/2019  2:46 PM ELECTROENCEPHALOGRAM REPORT Patient: Jake Gomez       Room #: IC16A-AA EEG No. ID: [id number] Age: 49 y.o.        Sex: male Referring Physician: Kasa Report Date:  06/14/2019       Interpreting Physician: [name] History: Jake Gomez is an 49 y.o. male with altered mental status and eye deviation Medications: ASA, Lipitor, Plavix, Epogen, Insulin, Keppra, Renvela, Fentanyl Conditions of Recording:  This is a 21 channel routine scalp EEG performed with bipolar and monopolar montages arranged in accordance to the international 10/20 system of electrode placement. One channel was dedicated to EKG recording. The patient is in the intubated and sedated state. Description:  Artifact is prominent during the recording producing a fast beta activity that is superimposed on a slow background that consists of a very low voltage polymorphic delta activity.  This is diffusely distributed and persistent throughout the recording.  No epileptiform activity is noted. Hyperventilation and intermittent photic stimulation were not performed. IMPRESSION: This is an abnormal EEG secondary to general background slowing.  This finding may be seen with a diffuse disturbance that is etiologically nonspecific, but may include a metabolic encephalopathy or medication effect, among other possibilities.  No epileptiform activity is noted.  [name], MD Neurology [phone] 06/14/2019, 2:32 PM   MR BRAIN WO CONTRAST  Result  Date: 06/13/2019 CLINICAL DATA:  Focal neuro deficit, greater than 6 hours, stroke suspected. Additional history provided: Acute ischemic stroke within cephalopathy. EXAM: MRI HEAD WITHOUT CONTRAST TECHNIQUE: Multiplanar, multiecho pulse sequences of the brain and surrounding structures were obtained without intravenous contrast. COMPARISON:  MRI/MRA head 05/17/2019 FINDINGS: Brain: Multiple sequences are mildly motion degraded. Again demonstrated  are patchy acute/early subacute ischemic infarcts within the bilateral centrum semiovale, corona radiata and periventricular white matter. Infarction changes have increased in extent as compared to prior examination 05/16/2019. For instance, infarction changes in the bilateral centrum semiovale/corona radiata appear more confluent. Additionally, there are several small new infarcts within the periatrial white matter bilaterally. Corresponding T2/FLAIR hyperintensity at these sites. Redemonstrated small focus of chronic microhemorrhage within the right centrum semiovale. No midline shift or extra-axial fluid collection. Cerebral volume is normal for age. Vascular: Flow voids maintained within the proximal large arterial vessels. Skull and upper cervical spine: No focal marrow lesion Sinuses/Orbits: Visualized orbits demonstrate no acute abnormality. Mild scattered paranasal sinus mucosal thickening. Trace fluid within bilateral mastoid air cells (greater on the right). IMPRESSION: Again demonstrated are patchy acute/early subacute infarcts within the bilateral centrum semiovale, corona radiata and periventricular white matter. These infarcts have increased in extent since prior MRI 05/19/2019, as described. No significant mass effect or midline shift. Electronically Signed   By: Kellie Simmering DO   On: 06/13/2019 14:51    Medications:  I have reviewed the patient's current medications. Scheduled: .  stroke: mapping our early stages of recovery book   Does not apply Once  . aspirin  324 mg Per Tube Daily  . atorvastatin  80 mg Per Tube q morning - 10a  . B-complex with vitamin C  1 tablet Per Tube Daily  . calcitRIOL  0.25 mcg Per Tube Daily  . chlorhexidine gluconate (MEDLINE KIT)  15 mL Mouth Rinse BID  . Chlorhexidine Gluconate Cloth  6 each Topical Q0600  . clopidogrel  75 mg Per Tube Daily  . epoetin (EPOGEN/PROCRIT) injection  10,000 Units Intravenous Q M,W,F-HD  . feeding supplement (PRO-STAT SUGAR  FREE 64)  60 mL Per Tube TID  . feeding supplement (VITAL HIGH PROTEIN)  1,000 mL Per Tube Q24H  . insulin aspart  0-9 Units Subcutaneous Q4H  . ipratropium-albuterol  3 mL Nebulization TID  . mouth rinse  15 mL Mouth Rinse 10 times per day  . pantoprazole sodium  40 mg Per Tube Daily  . senna-docusate  1 tablet Per Tube BID  . sevelamer carbonate  0.8 g Oral TID WC  . sodium chloride flush  10-40 mL Intracatheter Q12H  . sterile water (preservative free)        Assessment/Plan: 49 y.o.malewith a history of DM, HTN and ESRDadmitted afteran episode of syncope and seizure like activity. Patient with multiple metabolic abnormalities/infection at presentation that may have provoked seizure like activity but MRI imaging performed as well and on review shows multiple acute, small infarcts bilaterally. MRA unremarkable. Embolic etiology likely.EEG was only significant for slowing. Patient further declined from a respiratory standpoint requiring intubation and has had difficulty weaning. Neurological status has declined as well. Unclear if this is related to continued multiple medical issues or if he has had further ischemic events. Patient on Plavix but has had some time off antiplatelet therapy due to bleeding.  MRI of the brain repeated on yesterday and reviewed.  MRI shows increase in acute infarcts in the same distribution as previous imaging of 12/19.  Repeat EEG  only significant for slowing.    Recommendations: 1. Will continue to follow with you   LOS: 13 days   [name], MD Neurology [phone] 06/15/2019  8:51 AM

## 2019-06-15 NOTE — Progress Notes (Signed)
Informed treatment team that no am lab work was ordered or preformed. No new orders at this time.

## 2019-06-15 NOTE — Progress Notes (Signed)
CRITICAL CARE PROGRESS NOTE    Name: Jake Gomez MRN: [id number] DOB: 1971-05-02     LOS: 55   SUBJECTIVE FINDINGS & SIGNIFICANT EVENTS   Patient description:  Jake Gomez is a 49 yo male with a history of T2DM, HTN, OSA, recent COVID-19 infection and ESRD on dialysis M/W/F.  He presented to the ED on 12/19 for generalized malaise after missing dialysis on Wednesday and Friday for a trip to [place].  He then had a CVA.    Currently experiencing acute hypoxic respiratory failure.     Lines / Drains: Hemodialysis catheter on Right IJ Hemodialysis catheter left femoral NG tube 8 mm ET tube airway CVC triple lumen  Cultures / Sepsis markers: 12/28: Tracheal aspirate demonstrates moderate WBC with PMNs and few yeast.  Reincubated for better growth.    Antibiotics: Unasyn 3 g at 200 mL/hr   Protocols / Consultants: Neurology Cardiology Nephrology  Tests / Events: 12/19: presented to the ED for malaise after missing 2 dialysis sessions.  Potassium 6.4, Creatinine 15.4.  Acute ischemic CVA, with negative MR angiogram.   12/20: CT angio chest demonstrates consolidation in the left lower lobe.  No evidence of PE.    12/29  Has become diaphoretic this AM with weaning fentanyl, and episodes of tachypnea over-riding ventilator.  Meeting in person with family  12/30- relatively unchanged with hemineglect, s/p repeat MRI -increased size brain infarcts 12/31- family meeting this am with Neurology, plan for repeat EEG.  1/1 - no impromevent in mentation, continuing plan for end of life care decisions with family. Repeat CXR today to eval interval changes post tx for asp pna. Repeat MRI discussed with family.      PAST MEDICAL HISTORY   Past Medical History:  Diagnosis Date  . Diabetes mellitus without  complication ([place])   . Hypertension   . Renal disorder   . Sleep apnea    can't afford CPAP     SURGICAL HISTORY   Past Surgical History:  Procedure Laterality Date  . AV FISTULA PLACEMENT Left 01/31/2019   Procedure: ARTERIOVENOUS (AV) FISTULA CREATION ( BRACHIOCEPHALIC );  Surgeon: Algernon Huxley, MD;  Location: [facility];  Service: Vascular;  Laterality: Left;  . DIALYSIS/PERMA CATHETER INSERTION N/A 07/19/2018   Procedure: DIALYSIS/PERMA CATHETER INSERTION;  Surgeon: Algernon Huxley, MD;  Location: [place] [facility];  Service: Cardiovascular;  Laterality: N/A;  . DIALYSIS/PERMA CATHETER INSERTION N/A 09/13/2018   Procedure: DIALYSIS/PERMA CATHETER INSERTION;  Surgeon: Katha Cabal, MD;  Location: [place] [facility];  Service: Cardiovascular;  Laterality: N/A;  . DIALYSIS/PERMA CATHETER REMOVAL N/A 05/23/2019   Procedure: DIALYSIS/PERMA CATHETER REMOVAL, temporary dialysis catheter placement;  Surgeon: Algernon Huxley, MD;  Location: [facility];  Service: Cardiovascular;  Laterality: N/A;  . peritonal dialysis    . REMOVAL OF A DIALYSIS CATHETER N/A 07/17/2018   Procedure: REMOVAL OF A DIALYSIS CATHETER;  Surgeon: Algernon Huxley, MD;  Location: [facility];  Service: Vascular;  Laterality: N/A;     FAMILY HISTORY   Family History  Problem Relation Age of Onset  . Thyroid disease Mother   . Stroke Father   . Cancer Father   . Diabetes Father      SOCIAL HISTORY   Social History   Tobacco Use  . Smoking status: Former Smoker    Quit date: 11/14/2009    Years since quitting: 9.5  . Smokeless tobacco: Former Systems developer    Quit date: 07/16/2009  Substance  Use Topics  . Alcohol use: No  . Drug use: Not Currently    Comment: history 10 years ago   No urine production     MEDICATIONS   Current Medication:  Current Facility-Administered Medications:  .   stroke: mapping our early stages of recovery book, , Does not apply, Once, Sharion Settler, NP .  0.9 %  sodium  chloride infusion, 250 mL, Intravenous, Continuous, Wyvonnia Dusky, MD, Last Rate: 5 mL/hr at 06/15/19 1400, Rate Verify at 06/15/19 1400 .  acetaminophen (TYLENOL) tablet 650 mg, 650 mg, Per Tube, Q6H PRN **OR** acetaminophen (TYLENOL) suppository 650 mg, 650 mg, Rectal, Q6H PRN, Gerald Dexter, RPH .  aspirin chewable tablet 324 mg, 324 mg, Per Tube, Daily, Flora Lipps, MD, 324 mg at 06/15/19 0929 .  atorvastatin (LIPITOR) tablet 80 mg, 80 mg, Per Tube, q morning - 10a, Gerald Dexter, RPH, 80 mg at 06/14/19 1728 .  B-complex with vitamin C tablet 1 tablet, 1 tablet, Per Tube, Daily, Flora Lipps, MD, 1 tablet at 06/15/19 0930 .  calcitRIOL (ROCALTROL) 1 MCG/ML solution 0.25 mcg, 0.25 mcg, Per Tube, Daily, Murlean Iba, MD, 0.25 mcg at 06/15/19 0929 .  chlorhexidine gluconate (MEDLINE KIT) (PERIDEX) 0.12 % solution 15 mL, 15 mL, Mouth Rinse, BID, Kasa, Kurian, MD, 15 mL at 06/15/19 0749 .  Chlorhexidine Gluconate Cloth 2 % PADS 6 each, 6 each, Topical, Q0600, Liana Gerold, MD, 6 each at 06/15/19 0934 .  clopidogrel (PLAVIX) tablet 75 mg, 75 mg, Per Tube, Daily, Ottie Glazier, MD, 75 mg at 06/15/19 0929 .  dexmedetomidine (PRECEDEX) 400 MCG/100ML (4 mcg/mL) infusion, 0.4-1.2 mcg/kg/hr, Intravenous, Titrated, Darel Hong D, NP, Last Rate: 12.27 mL/hr at 06/15/19 1400, 0.4 mcg/kg/hr at 06/15/19 1400 .  epoetin alfa (EPOGEN) injection 10,000 Units, 10,000 Units, Intravenous, Q M,W,F-HD, Murlean Iba, MD, 10,000 Units at 06/11/19 1413 .  feeding supplement (PRO-STAT SUGAR FREE 64) liquid 60 mL, 60 mL, Per Tube, TID, Flora Lipps, MD, 60 mL at 06/15/19 0934 .  feeding supplement (VITAL HIGH PROTEIN) liquid 1,000 mL, 1,000 mL, Per Tube, Q24H, Kasa, Kurian, MD, 1,000 mL at 06/14/19 0905 .  fentaNYL (SUBLIMAZE) bolus via infusion 50 mcg, 50 mcg, Intravenous, Q15 min PRN, Darel Hong D, NP .  fentaNYL 2558mg in NS 2510m(101mml) infusion-PREMIX, 0-400 mcg/hr,  Intravenous, Continuous, Kasa, Kurian, MD, Last Rate: 25 mL/hr at 06/15/19 1400, 250 mcg/hr at 06/15/19 1400 .  heparin injection 1,000-6,000 Units, 1,000-6,000 Units, CRRT, PRN, Kasa, Kurian, MD .  insulin aspart (novoLOG) injection 0-9 Units, 0-9 Units, Subcutaneous, Q4H, KeeDarel Hong NP, 2 Units at 06/15/19 1157 .  ipratropium-albuterol (DUONEB) 0.5-2.5 (3) MG/3ML nebulizer solution 3 mL, 3 mL, Nebulization, Q6H PRN, WilWyvonnia DuskyD .  ipratropium-albuterol (DUONEB) 0.5-2.5 (3) MG/3ML nebulizer solution 3 mL, 3 mL, Nebulization, TID, KasMortimer Friesurian, MD, 3 mL at 06/15/19 1320 .  labetalol (NORMODYNE) injection 10 mg, 10 mg, Intravenous, Q6H PRN, AleLanney Ginsuad, MD .  levETIRAcetam (KEPPRA) IVPB 500 mg/100 mL premix, 500 mg, Intravenous, Q12H, KeeBradly BienenstockP, Stopped at 06/15/19 0253 .  MEDLINE mouth rinse, 15 mL, Mouth Rinse, 10 times per day, KasFlora LippsD, 15 mL at 06/15/19 1347 .  midazolam (VERSED) injection 2 mg, 2 mg, Intravenous, Q2H PRN, KeeDarel Hong NP, 2 mg at 06/15/19 0929 .  norepinephrine (LEVOPHED) 16 mg in 250m23memix infusion, 0-40 mcg/min, Intravenous, Titrated, KeenDarel HongNP, Last Rate: 7.5 mL/hr at 06/15/19 1400, 8 mcg/min at  06/15/19 1400 .  ondansetron (ZOFRAN) tablet 4 mg, 4 mg, Per Tube, Q6H PRN **OR** ondansetron (ZOFRAN) injection 4 mg, 4 mg, Intravenous, Q6H PRN, Gerald Dexter, RPH .  pantoprazole sodium (PROTONIX) 40 mg/20 mL oral suspension 40 mg, 40 mg, Per Tube, Daily, Kasa, Kurian, MD, 40 mg at 06/15/19 0930 .  senna-docusate (Senokot-S) tablet 1 tablet, 1 tablet, Per Tube, BID, Flora Lipps, MD, 1 tablet at 06/15/19 0929 .  sevelamer carbonate (RENVELA) packet 0.8 g, 0.8 g, Oral, TID WC, Singh, Harmeet, MD, 0.8 g at 06/15/19 1157 .  sodium chloride flush (NS) 0.9 % injection 10-40 mL, 10-40 mL, Intracatheter, Q12H, Kasa, Kurian, MD, 10 mL at 06/15/19 0934 .  sodium chloride flush (NS) 0.9 % injection 10-40 mL, 10-40 mL,  Intracatheter, PRN, Flora Lipps, MD .  sterile water (preservative free) injection, , , ,  .  vecuronium (NORCURON) injection 10 mg, 10 mg, Intravenous, Q1H PRN, Flora Lipps, MD, 10 mg at 06/14/19 1820    ALLERGIES   Patient has no known allergies.    REVIEW OF SYSTEMS   Unable to complete due to severity of patient illness  PHYSICAL EXAMINATION   Vital Signs: Temp:  [97.7 F (36.5 C)-99.4 F (37.4 C)] 97.7 F (36.5 C) (01/01 1200) Pulse Rate:  [73-116] 78 (01/01 1400) Resp:  [13-19] 14 (01/01 1400) BP: (92-202)/(37-121) 172/52 (01/01 1400) SpO2:  [92 %-100 %] 100 % (01/01 1400) FiO2 (%):  [24 %-40 %] 40 % (01/01 1200)  GENERAL: Patient sedated, intubated and on vent.  He opens eyes in response to verbal stimulation.   HEAD: Normocephalic, atraumatic.  EYES: Pupils equal, round, reactive to light.  Eyes deviated to up and to the left.  No scleral icterus.  MOUTH: Moist mucosal membrane. NECK: Supple. No thyromegaly. No nodules. No JVD.  PULMONARY: Decreased breath sounds in Left lower lobe.  Patient exhibiting episodes of tachypnea overriding the vent.     CARDIOVASCULAR: S1 and S2. Regular rate and rhythm. No murmurs, rubs, or gallops.  GASTROINTESTINAL: Soft, nontender, non-distended. No masses. Positive bowel sounds. No hepatosplenomegaly.  MUSCULOSKELETAL: No swelling, clubbing, or edema.  NEUROLOGIC: Mild distress due to acute illness. Non purposeful movement of bilateral arms, minimal nonpurposeful movements of bilateral legs.  Does not respond to commands.    SKIN:intact,warm,diaphoretic   PERTINENT DATA     Infusions: . sodium chloride 5 mL/hr at 06/15/19 1400  . dexmedetomidine (PRECEDEX) IV infusion 0.4 mcg/kg/hr (06/15/19 1400)  . fentaNYL infusion INTRAVENOUS 250 mcg/hr (06/15/19 1400)  . levETIRAcetam Stopped (06/15/19 0253)  . norepinephrine (LEVOPHED) Adult infusion 8 mcg/min (06/15/19 1400)   Scheduled Medications: .  stroke: mapping our early  stages of recovery book   Does not apply Once  . aspirin  324 mg Per Tube Daily  . atorvastatin  80 mg Per Tube q morning - 10a  . B-complex with vitamin C  1 tablet Per Tube Daily  . calcitRIOL  0.25 mcg Per Tube Daily  . chlorhexidine gluconate (MEDLINE KIT)  15 mL Mouth Rinse BID  . Chlorhexidine Gluconate Cloth  6 each Topical Q0600  . clopidogrel  75 mg Per Tube Daily  . epoetin (EPOGEN/PROCRIT) injection  10,000 Units Intravenous Q M,W,F-HD  . feeding supplement (PRO-STAT SUGAR FREE 64)  60 mL Per Tube TID  . feeding supplement (VITAL HIGH PROTEIN)  1,000 mL Per Tube Q24H  . insulin aspart  0-9 Units Subcutaneous Q4H  . ipratropium-albuterol  3 mL Nebulization TID  . mouth  rinse  15 mL Mouth Rinse 10 times per day  . pantoprazole sodium  40 mg Per Tube Daily  . senna-docusate  1 tablet Per Tube BID  . sevelamer carbonate  0.8 g Oral TID WC  . sodium chloride flush  10-40 mL Intracatheter Q12H  . sterile water (preservative free)       PRN Medications: acetaminophen **OR** acetaminophen, fentaNYL, heparin, ipratropium-albuterol, labetalol, midazolam, ondansetron **OR** ondansetron (ZOFRAN) IV, sodium chloride flush, vecuronium Hemodynamic parameters:   Intake/Output: 12/31 0701 - 01/01 0700 In: 4008 [I.V.:833; NG/GT:680; IV Piggyback:200] Out: 275 [Stool:275]  Ventilator  Settings: Vent Mode: PCV FiO2 (%):  [24 %-40 %] 40 % Set Rate:  [16 bmp] 16 bmp PEEP:  [5 cmH20] 5 cmH20 Plateau Pressure:  [15 cmH20-19 cmH20] 19 cmH20   Other Labs:  LAB RESULTS:  Basic Metabolic Panel: Recent Labs  Lab 06/10/19 0540 06/11/19 0551 06/12/19 0513 06/13/19 0453 06/14/19 0454  NA 135 136 141 140 141  K 4.0 4.2 3.8 4.2 3.9  CL 97* 96* 97* 96* 98  CO2 23 21* _0 GLUCOSE 216* 220* 217* 232* 179*  BUN 84* 118* 96* 109* 87*  CREATININE 6.34* 8.32* 7.17* 8.94* 7.07*  CALCIUM 7.4* 7.2* 7.6* 7.3* 7.4*  MG 2.5*  --   --  2.6*  --   PHOS 7.9*  --   --   --   --    Liver  Function Tests: No results for input(s): AST, ALT, ALKPHOS, BILITOT, PROT, ALBUMIN in the last 168 hours. No results for input(s): LIPASE, AMYLASE in the last 168 hours. No results for input(s): AMMONIA in the last 168 hours. CBC: Recent Labs  Lab 06/09/19 0430 06/10/19 0540 06/11/19 0551 06/12/19 0513 06/13/19 0453 06/14/19 0454  WBC 8.7 10.1 8.9 9.2 11.2* 8.9  NEUTROABS 4.5  --  5.1 6.0  --   --   HGB 7.9* 8.4* 7.9* 8.8* 8.2* 7.1*  HCT 24.4* 26.7* 25.2* 28.6* 26.2* 24.4*  MCV 99.2 104.3* 100.0 104.4* 100.4* 105.2*  PLT 140* 191 234 303 291 308   Cardiac Enzymes: No results for input(s): CKTOTAL, CKMB, CKMBINDEX, TROPONINI in the last 168 hours. BNP: Invalid input(s): POCBNP CBG: Recent Labs  Lab 06/14/19 1937 06/15/19 0023 06/15/19 0327 06/15/19 0753 06/15/19 1124  GLUCAP 201* 214* 241* 210* 182*     IMAGING RESULTS:  Imaging: EEG  Result Date: 06/14/2019 [name], MD     06/14/2019  2:46 PM ELECTROENCEPHALOGRAM REPORT Patient: [name]       Room #: IC16A-AA EEG No. ID: [id number] Age: 49 y.o.        Sex: male Referring Physician: Kasa Report Date:  06/14/2019       Interpreting Physician: [name] History: Tamotsu Wiederholt is an 49 y.o. male with altered mental status and eye deviation Medications: ASA, Lipitor, Plavix, Epogen, Insulin, Keppra, Renvela, Fentanyl Conditions of Recording:  This is a 21 channel routine scalp EEG performed with bipolar and monopolar montages arranged in accordance to the international 10/20 system of electrode placement. One channel was dedicated to EKG recording. The patient is in the intubated and sedated state. Description:  Artifact is prominent during the recording producing a fast beta activity that is superimposed on a slow background that consists of a very low voltage polymorphic delta activity.  This is diffusely distributed and persistent throughout the recording.  No epileptiform activity is noted. Hyperventilation  and intermittent photic stimulation were not performed. IMPRESSION: This is an abnormal  EEG secondary to general background slowing.  This finding may be seen with a diffuse disturbance that is etiologically nonspecific, but may include a metabolic encephalopathy or medication effect, among other possibilities.  No epileptiform activity is noted.  [name], MD Neurology [phone] 06/14/2019, 2:32 PM   MR BRAIN WO CONTRAST  Result Date: 06/13/2019 CLINICAL DATA:  Focal neuro deficit, greater than 6 hours, stroke suspected. Additional history provided: Acute ischemic stroke within cephalopathy. EXAM: MRI HEAD WITHOUT CONTRAST TECHNIQUE: Multiplanar, multiecho pulse sequences of the brain and surrounding structures were obtained without intravenous contrast. COMPARISON:  MRI/MRA head 06/12/2019 FINDINGS: Brain: Multiple sequences are mildly motion degraded. Again demonstrated are patchy acute/early subacute ischemic infarcts within the bilateral centrum semiovale, corona radiata and periventricular white matter. Infarction changes have increased in extent as compared to prior examination 06/06/2019. For instance, infarction changes in the bilateral centrum semiovale/corona radiata appear more confluent. Additionally, there are several small new infarcts within the periatrial white matter bilaterally. Corresponding T2/FLAIR hyperintensity at these sites. Redemonstrated small focus of chronic microhemorrhage within the right centrum semiovale. No midline shift or extra-axial fluid collection. Cerebral volume is normal for age. Vascular: Flow voids maintained within the proximal large arterial vessels. Skull and upper cervical spine: No focal marrow lesion Sinuses/Orbits: Visualized orbits demonstrate no acute abnormality. Mild scattered paranasal sinus mucosal thickening. Trace fluid within bilateral mastoid air cells (greater on the right). IMPRESSION: Again demonstrated are patchy acute/early subacute  infarcts within the bilateral centrum semiovale, corona radiata and periventricular white matter. These infarcts have increased in extent since prior MRI 06/06/2019, as described. No significant mass effect or midline shift. Electronically Signed   By: Kellie Simmering DO   On: 06/13/2019 14:51      ASSESSMENT AND PLAN    -Multidisciplinary rounds held today  Acute Hypoxic Respiratory Failure with Left Lower Lobe Pneumonia - Left lower lobe pneumonia demonstrated on CT chest 12/20- continue treatment with unasyn - patient previously positive for pseudomonas in 07/2018 on peritoneal catheter.   -continue Full MV support -continue Bronchodilator Therapy -Wean Fio2 and PEEP as tolerated -will perform SAT/SBT when respiratory parameters are met  Acute ischemic CVA - consulting with neurology - MRI head on 12/20 demonstrated patchy multifocal acute ischemic infarcts. MRA head on 12/20 demonstrated no large vessel occlusion, stenosis or acute vascular abnormality.   - EEG consistent with no seizures, diffuse encephalopathy - Carotid ultrasound demonstrates mild occlusion, not clinically significant - ICU telemonitoring - Tachypnea overriding vent, fixed gaze up and to left, diaphoretic with weaning off fentanyl - Multiple family meetings have already took place - Dr Doy Mince met with family separately as well. They are very appreicative of care and we are forming plans for poss trache/peg vs compassionate weaning from MV   Renal Failure-ESRD -follow chem 7 -follow UO -continue Foley Catheter-assess need daily - last hemodialysis yesterday -Nephrology consulting on case  ID -continue IV abx as prescibed: unasyn -follow up cultures  GI/Nutrition GI PROPHYLAXIS as indicated DIET-->TF's as tolerated  Constipation protocol as indicated  ENDO - ICU hypoglycemic\Hyperglycemia protocol -check FSBS per protocol   ELECTROLYTES -follow labs as needed -replace as needed -pharmacy  consultation   DVT/GI PRX ordered -SCDs  TRANSFUSIONS AS NEEDED MONITOR FSBS ASSESS the need for LABS as needed   Critical care provider statement:    Critical care time (minutes):  35   Critical care time was exclusive of:  Separately billable procedures and treating other patients   Critical care was necessary to  treat or prevent imminent or life-threatening deterioration of the following conditions:  acute respiratory failure, acute CVA,    Critical care was time spent personally by me on the following activities:  Development of treatment plan with patient or surrogate, discussions with consultants, evaluation of patient's response to treatment, examination of patient, obtaining history from patient or surrogate, ordering and performing treatments and interventions, ordering and review of laboratory studies and re-evaluation of patient's condition.  I assumed direction of critical care for this patient from another provider in my specialty: no    This document was prepared using Dragon voice recognition software and may include unintentional dictation errors.    Ottie Glazier, M.D.  Division of [place]

## 2019-06-15 NOTE — Progress Notes (Signed)
HD Tx Completed   06/15/19 1715  Vital Signs  Pulse Rate 75  Resp 15  BP (!) 151/56  Oxygen Therapy  SpO2 100 %  End [place] CO2 (EtCO2) 39  Pain Assessment  Pain Scale Faces  Pain Score 0  During Hemodialysis Assessment  Blood Flow Rate (mL/min) 350 mL/min  Arterial Pressure (mmHg) -200 mmHg  Venous Pressure (mmHg) 150 mmHg  Transmembrane Pressure (mmHg) 60 mmHg  Ultrafiltration Rate (mL/min) 340 mL/min  Dialysate Flow Rate (mL/min) 600 ml/min  Conductivity: Machine  14  HD Safety Checks Performed Yes  Intra-Hemodialysis Comments Tx completed;Tolerated well

## 2019-06-15 NOTE — Progress Notes (Signed)
Pre HD ASSESSMENT   06/15/19 1345  Neurological  Level of Consciousness Unresponsive  Orientation Level Intubated/Tracheostomy - Unable to assess  Respiratory  Respiratory Pattern Regular;Unlabored  Chest Assessment Chest expansion symmetrical  Bilateral Breath Sounds Diminished  Cardiac  Pulse Regular  Heart Sounds S1, S2  Jugular Venous Distention (JVD) No  Cardiac Rhythm NSR  Antiarrhythmic device No  Vascular  R Radial Pulse +2  L Radial Pulse +2  R Dorsalis Pedis Pulse +2  L Dorsalis Pedis Pulse +2  Edema Generalized  Generalized Edema +1  Integumentary  Integumentary (WDL) X  Skin Color Appropriate for ethnicity  Skin Condition Dry  Skin Integrity Excoriated (scratch marks);Ecchymosis;Abrasion  Ecchymosis Location Arm;Leg  Ecchymosis Location Orientation Right;Left  Musculoskeletal  Musculoskeletal (WDL) X  Generalized Weakness Yes  Gastrointestinal  Bowel Sounds Assessment Active  Last BM Date 06/15/19  Psychosocial  Psychosocial (WDL) X  Patient Behaviors Withdrawn  Needs Expressed Physical  Emotional support given Given to patient

## 2019-06-15 NOTE — Progress Notes (Signed)
[facility]  ROUNDING NOTE   Subjective:   Patient is sedated on fentanyl and precedex and dose is being adjusted  vent depended FIO2,  40 % TF @ 40 cc/hr    Objective:  Vital signs in last 24 hours:  Temp:  [98.1 F (36.7 C)-99.4 F (37.4 C)] 99.1 F (37.3 C) (01/01 0800) Pulse Rate:  [73-116] 79 (01/01 0900) Resp:  [13-19] 16 (01/01 0900) BP: (92-202)/(37-121) 107/39 (01/01 0900) SpO2:  [92 %-100 %] 100 % (01/01 0900) FiO2 (%):  [24 %-40 %] 40 % (01/01 0833)  Weight change:  Filed Weights   06/05/19 0448 06/06/19 0424 06/07/19 0248  Weight: 121.9 kg 123.7 kg 122.7 kg    Intake/Output: I/O last 3 completed shifts: In: 2914.1 [I.V.:1334.1; NG/GT:1280; IV Piggyback:300] Out: 5993 [Other:1000; Stool:275]   Intake/Output this shift:  Total I/O In: 322.8 [I.V.:322.8] Out: 0   Physical Exam: General: Critically ill   HEENT: ETT   Lungs:  Vent assisted, coarse breath sounds at bases  Heart: Regular, 3/6 systolic murmur  Abdomen:  Soft, nontender, obese  Extremities: trace peripheral edema.  Neurologic: Intubated and sedated, eyes open and has left lateral gaze at times  Skin: No lesions  Access: Left femoral temp HD catheter 12/21 Dr. Lucky Cowboy Left Arm AVF Dr. Lucky Cowboy 5/70/17    Basic Metabolic Panel: Recent Labs  Lab 06/10/19 0540 06/11/19 0551 06/12/19 0513 06/13/19 0453 06/14/19 0454  NA 135 136 141 140 141  K 4.0 4.2 3.8 4.2 3.9  CL 97* 96* 97* 96* 98  CO2 23 21* '26 25 27  '$ GLUCOSE 216* 220* 217* 232* 179*  BUN 84* 118* 96* 109* 87*  CREATININE 6.34* 8.32* 7.17* 8.94* 7.07*  CALCIUM 7.4* 7.2* 7.6* 7.3* 7.4*  MG 2.5*  --   --  2.6*  --   PHOS 7.9*  --   --   --   --     Liver Function Tests: No results for input(s): AST, ALT, ALKPHOS, BILITOT, PROT, ALBUMIN in the last 168 hours. No results for input(s): LIPASE, AMYLASE in the last 168 hours. No results for input(s): AMMONIA in the last 168 hours.  CBC: Recent Labs  Lab 06/09/19 0430  06/10/19 0540 06/11/19 0551 06/12/19 0513 06/13/19 0453 06/14/19 0454  WBC 8.7 10.1 8.9 9.2 11.2* 8.9  NEUTROABS 4.5  --  5.1 6.0  --   --   HGB 7.9* 8.4* 7.9* 8.8* 8.2* 7.1*  HCT 24.4* 26.7* 25.2* 28.6* 26.2* 24.4*  MCV 99.2 104.3* 100.0 104.4* 100.4* 105.2*  PLT 140* 191 234 303 291 308    Cardiac Enzymes: No results for input(s): CKTOTAL, CKMB, CKMBINDEX, TROPONINI in the last 168 hours.  BNP: Invalid input(s): POCBNP  CBG: Recent Labs  Lab 06/14/19 1937 06/15/19 0023 06/15/19 0327 06/15/19 0753 06/15/19 1124  GLUCAP 201* 214* 241* 210* 182*    Microbiology: Results for orders placed or performed during the hospital encounter of 05/15/2019  Respiratory Panel by RT PCR (Flu A&B, Covid) - Nasopharyngeal Swab     Status: None   Collection Time: 05/19/2019  2:35 AM   Specimen: Nasopharyngeal Swab  Result Value Ref Range Status   SARS Coronavirus 2 by RT PCR NEGATIVE NEGATIVE Final    Comment: (NOTE) SARS-CoV-2 target nucleic acids are NOT DETECTED. The SARS-CoV-2 RNA is generally detectable in upper respiratoy specimens during the acute phase of infection. The lowest concentration of SARS-CoV-2 viral copies this assay can detect is 131 copies/mL. A negative result does  not preclude SARS-Cov-2 infection and should not be used as the sole basis for treatment or other patient management decisions. A negative result may occur with  improper specimen collection/handling, submission of specimen other than nasopharyngeal swab, presence of viral mutation(s) within the areas targeted by this assay, and inadequate number of viral copies (<131 copies/mL). A negative result must be combined with clinical observations, patient history, and epidemiological information. The expected result is Negative. Fact Sheet for Patients:  PinkCheek.be Fact Sheet for Healthcare Providers:  GravelBags.it This test is not yet ap proved or  cleared by the [place] FDA and  has been authorized for detection and/or diagnosis of SARS-CoV-2 by FDA under an Emergency Use Authorization (EUA). This EUA will remain  in effect (meaning this test can be used) for the duration of the COVID-19 declaration under Section 564(b)(1) of the Act, 21 U.S.C. section 360bbb-3(b)(1), unless the authorization is terminated or revoked sooner.    Influenza A by PCR NEGATIVE NEGATIVE Final   Influenza B by PCR NEGATIVE NEGATIVE Final    Comment: (NOTE) The Xpert Xpress SARS-CoV-2/FLU/RSV assay is intended as an aid in  the diagnosis of influenza from Nasopharyngeal swab specimens and  should not be used as a sole basis for treatment. Nasal washings and  aspirates are unacceptable for Xpert Xpress SARS-CoV-2/FLU/RSV  testing. Fact Sheet for Patients: PinkCheek.be Fact Sheet for Healthcare Providers: GravelBags.it This test is not yet approved or cleared by the [place] FDA and  has been authorized for detection and/or diagnosis of SARS-CoV-2 by  FDA under an Emergency Use Authorization (EUA). This EUA will remain  in effect (meaning this test can be used) for the duration of the  Covid-19 declaration under Section 564(b)(1) of the Act, 21  U.S.C. section 360bbb-3(b)(1), unless the authorization is  terminated or revoked. Performed at [facility], [address] [place], [place] [address]   Culture, blood (routine x 2)     Status: None   Collection Time: 06/06/2019  2:36 AM   Specimen: BLOOD  Result Value Ref Range Status   Specimen Description BLOOD LEFT ANTECUBITAL  Final   Special Requests   Final    BOTTLES DRAWN AEROBIC AND ANAEROBIC Blood Culture adequate volume   Culture   Final    NO GROWTH 5 DAYS Performed at [facility], [address], [place], [place] [address]    Report Status 06/07/2019 FINAL  Final  Culture, blood (routine x 2)      Status: None   Collection Time: 05/27/2019  2:36 AM   Specimen: BLOOD  Result Value Ref Range Status   Specimen Description BLOOD RIGHT FOREARM  Final   Special Requests   Final    BOTTLES DRAWN AEROBIC AND ANAEROBIC Blood Culture adequate volume   Culture   Final    NO GROWTH 5 DAYS Performed at [facility], [address] [place], [place] [address]    Report Status 06/07/2019 FINAL  Final  Culture, blood (routine x 2)     Status: None   Collection Time: 06/10/2019  3:10 PM   Specimen: BLOOD  Result Value Ref Range Status   Specimen Description BLOOD A-LINE  Final   Special Requests   Final    BOTTLES DRAWN AEROBIC AND ANAEROBIC Blood Culture results may not be optimal due to an excessive volume of blood received in culture bottles   Culture   Final    NO GROWTH 5 DAYS Performed at [facility] [place] [facility],  [place], [place] [address]    Report Status 06/07/2019 FINAL  Final  Culture, blood (routine x 2)     Status: None   Collection Time: 06/09/2019  3:10 PM   Specimen: BLOOD  Result Value Ref Range Status   Specimen Description BLOOD A-LINE  Final   Special Requests   Final    BOTTLES DRAWN AEROBIC AND ANAEROBIC Blood Culture results may not be optimal due to an excessive volume of blood received in culture bottles   Culture   Final    NO GROWTH 5 DAYS Performed at [facility], [address] [place], [place] [address]    Report Status 06/07/2019 FINAL  Final  MRSA PCR Screening     Status: None   Collection Time: 06/03/19  5:45 PM   Specimen: Nasopharyngeal  Result Value Ref Range Status   MRSA by PCR NEGATIVE NEGATIVE Final    Comment:        The GeneXpert MRSA Assay (FDA approved for NASAL specimens only), is one component of a comprehensive MRSA colonization surveillance program. It is not intended to diagnose MRSA infection nor to guide or monitor treatment for MRSA infections. Performed at [facility], [address] [place], [place] [address]   Urine Culture     Status: None   Collection Time: 06/03/19  6:20 PM   Specimen: Urine, Catheterized  Result Value Ref Range Status   Specimen Description   Final    URINE, CATHETERIZED Performed at [facility], [address], [place], [place] [address]    Special Requests   Final    Immunocompromised Performed at [facility], [address], [place], [place] [address]    Culture   Final    NO GROWTH Performed at [facility], [place] [address] [place], [place] [address]    Report Status 06/05/2019 FINAL  Final  Cath Tip Culture     Status: None   Collection Time: 05/28/2019  4:32 PM   Specimen: Catheter Tip; Other  Result Value Ref Range Status   Specimen Description   Final    CATH TIP Performed at [facility], [address] [place], [place] [address]    Special Requests   Final    NONE Performed at [facility] [address] [place], [place] [address]    Culture   Final    NO GROWTH 2 DAYS Performed at [facility], [place] [address] [place], [place] [address]    Report Status 06/07/2019 FINAL  Final  Culture, respiratory (non-expectorated)     Status: None   Collection Time: 06/06/19 10:22 AM   Specimen: Tracheal Aspirate; Respiratory  Result Value Ref Range Status   Specimen Description   Final    TRACHEAL ASPIRATE Performed at [facility], [address], [place], [place] [address]    Special Requests   Final    NONE Performed at [facility], [address] [place], [place] [address]    Gram Stain   Final    RARE WBC PRESENT, PREDOMINANTLY PMN NO ORGANISMS SEEN Performed at [facility], [place] [address], [place], [place] [address]    Culture   Final    RARE CANDIDA ALBICANS RARE FUNGUS (MOLD) ISOLATED, PROBABLE CONTAMINANT/COLONIZER (SAPROPHYTE). CONTACT MICROBIOLOGY IF FURTHER IDENTIFICATION REQUIRED [phone].    Report Status  06/09/2019 FINAL  Final  CULTURE, BLOOD (ROUTINE X 2) w Reflex to ID Panel  Status: None (Preliminary result)   Collection Time: 06/11/19  1:09 PM   Specimen: BLOOD  Result Value Ref Range Status   Specimen Description BLOOD A-LINE DRAW  Final   Special Requests   Final    BOTTLES DRAWN AEROBIC AND ANAEROBIC Blood Culture adequate volume   Culture   Final    NO GROWTH 4 DAYS Performed at [facility], [address], [place], [place] [address]    Report Status PENDING  Incomplete  CULTURE, BLOOD (ROUTINE X 2) w Reflex to ID Panel     Status: None (Preliminary result)   Collection Time: 06/11/19  1:10 PM   Specimen: BLOOD  Result Value Ref Range Status   Specimen Description BLOOD A-LINE DRAW  Final   Special Requests   Final    BOTTLES DRAWN AEROBIC AND ANAEROBIC Blood Culture adequate volume   Culture   Final    NO GROWTH 4 DAYS Performed at [facility], [address] [place], [place] [address]    Report Status PENDING  Incomplete  Culture, respiratory (non-expectorated)     Status: None   Collection Time: 06/11/19  6:04 PM   Specimen: Tracheal Aspirate; Respiratory  Result Value Ref Range Status   Specimen Description   Final    TRACHEAL ASPIRATE Performed at [facility], [address], [place], [place] [address]    Special Requests   Final    NONE Performed at [facility], [address] [place], [place] [address]    Gram Stain   Final    MODERATE WBC PRESENT, PREDOMINANTLY PMN FEW YEAST Performed at [facility], [place] [address], [place], [place] [address]    Culture FEW CANDIDA ALBICANS  Final   Report Status 06/14/2019 FINAL  Final    Coagulation Studies: No results for input(s): LABPROT, INR in the last 72 hours.  Urinalysis: No results for input(s): COLORURINE, LABSPEC, PHURINE, GLUCOSEU, HGBUR, BILIRUBINUR, KETONESUR, PROTEINUR, UROBILINOGEN, NITRITE, LEUKOCYTESUR in the last 72 hours.  Invalid  input(s): APPERANCEUR    Imaging: EEG  Result Date: 06/14/2019 [name], MD     06/14/2019  2:46 PM ELECTROENCEPHALOGRAM REPORT Patient: [name]       Room #: IC16A-AA EEG No. ID: [id number] Age: 49 y.o.        Sex: male Referring Physician: Kasa Report Date:  06/14/2019       Interpreting Physician: [name] History: Sathvik Tiedt is an 49 y.o. male with altered mental status and eye deviation Medications: ASA, Lipitor, Plavix, Epogen, Insulin, Keppra, Renvela, Fentanyl Conditions of Recording:  This is a 21 channel routine scalp EEG performed with bipolar and monopolar montages arranged in accordance to the international 10/20 system of electrode placement. One channel was dedicated to EKG recording. The patient is in the intubated and sedated state. Description:  Artifact is prominent during the recording producing a fast beta activity that is superimposed on a slow background that consists of a very low voltage polymorphic delta activity.  This is diffusely distributed and persistent throughout the recording.  No epileptiform activity is noted. Hyperventilation and intermittent photic stimulation were not performed. IMPRESSION: This is an abnormal EEG secondary to general background slowing.  This finding may be seen with a diffuse disturbance that is etiologically nonspecific, but may include a metabolic encephalopathy or medication effect, among other possibilities.  No epileptiform activity is noted.  [name], MD Neurology [phone] 06/14/2019, 2:32 PM   MR BRAIN WO CONTRAST  Result  Date: 06/13/2019 CLINICAL DATA:  Focal neuro deficit, greater than 6 hours, stroke suspected. Additional history provided: Acute ischemic stroke within cephalopathy. EXAM: MRI HEAD WITHOUT CONTRAST TECHNIQUE: Multiplanar, multiecho pulse sequences of the brain and surrounding structures were obtained without intravenous contrast. COMPARISON:  MRI/MRA head 06/06/2019 FINDINGS: Brain: Multiple  sequences are mildly motion degraded. Again demonstrated are patchy acute/early subacute ischemic infarcts within the bilateral centrum semiovale, corona radiata and periventricular white matter. Infarction changes have increased in extent as compared to prior examination 06/13/2019. For instance, infarction changes in the bilateral centrum semiovale/corona radiata appear more confluent. Additionally, there are several small new infarcts within the periatrial white matter bilaterally. Corresponding T2/FLAIR hyperintensity at these sites. Redemonstrated small focus of chronic microhemorrhage within the right centrum semiovale. No midline shift or extra-axial fluid collection. Cerebral volume is normal for age. Vascular: Flow voids maintained within the proximal large arterial vessels. Skull and upper cervical spine: No focal marrow lesion Sinuses/Orbits: Visualized orbits demonstrate no acute abnormality. Mild scattered paranasal sinus mucosal thickening. Trace fluid within bilateral mastoid air cells (greater on the right). IMPRESSION: Again demonstrated are patchy acute/early subacute infarcts within the bilateral centrum semiovale, corona radiata and periventricular white matter. These infarcts have increased in extent since prior MRI 05/29/2019, as described. No significant mass effect or midline shift. Electronically Signed   By: Kellie Simmering DO   On: 06/13/2019 14:51     Medications:   . sodium chloride 5 mL/hr at 06/15/19 0929  . dexmedetomidine (PRECEDEX) IV infusion 0.6 mcg/kg/hr (06/15/19 0929)  . fentaNYL infusion INTRAVENOUS 270 mcg/hr (06/15/19 0929)  . levETIRAcetam Stopped (06/15/19 0253)  . norepinephrine (LEVOPHED) Adult infusion 10 mcg/min (06/15/19 0929)   .  stroke: mapping our early stages of recovery book   Does not apply Once  . aspirin  324 mg Per Tube Daily  . atorvastatin  80 mg Per Tube q morning - 10a  . B-complex with vitamin C  1 tablet Per Tube Daily  . calcitRIOL  0.25  mcg Per Tube Daily  . chlorhexidine gluconate (MEDLINE KIT)  15 mL Mouth Rinse BID  . Chlorhexidine Gluconate Cloth  6 each Topical Q0600  . clopidogrel  75 mg Per Tube Daily  . epoetin (EPOGEN/PROCRIT) injection  10,000 Units Intravenous Q M,W,F-HD  . feeding supplement (PRO-STAT SUGAR FREE 64)  60 mL Per Tube TID  . feeding supplement (VITAL HIGH PROTEIN)  1,000 mL Per Tube Q24H  . insulin aspart  0-9 Units Subcutaneous Q4H  . ipratropium-albuterol  3 mL Nebulization TID  . mouth rinse  15 mL Mouth Rinse 10 times per day  . pantoprazole sodium  40 mg Per Tube Daily  . senna-docusate  1 tablet Per Tube BID  . sevelamer carbonate  0.8 g Oral TID WC  . sodium chloride flush  10-40 mL Intracatheter Q12H  . sterile water (preservative free)       acetaminophen **OR** acetaminophen, fentaNYL, heparin, ipratropium-albuterol, labetalol, midazolam, ondansetron **OR** ondansetron (ZOFRAN) IV, sodium chloride flush, vecuronium  Assessment/ Plan:  Mr. Tayvian Holycross is a 49 y.o. Hispanic male with end stage renal disease, diabetes mellitus type I, hypertension, sleep apnea who was admitted to [facility] on 05/28/2019 for Hyperkalemia [E87.5] CVA (cerebral vascular accident) ([place]) [I63.9] Stroke ([place]) [I63.9] ESRD (end stage renal disease) on dialysis ([place]) [N18.6, Z99.2] Chest pain [R07.9]  [facility] MWF [address] [address] RIJ 120kg  1. End stage renal disease on hemodialysis. Requiring CRRT from 12/21 to 12/24 Continued on MWF schedule  Complication of dialysis device: permcath removed.  Currently with temp HD catheter.  AVF maturing. Next HD planned for later today  2. Acute Resp failure - Pneumonia: with acute respiratory failure requiring intubation and mechanical ventilation.    - Abx completed -   possible tracheostomy in Near future  3. Anemia of chronic kidney disease: macrocytic.  Lab Results  Component Value Date   HGB 7.1 (L) 06/14/2019   - EPO with HD treatment.   4. Secondary  Hyperparathyroidism with hypocalcemia and hyperphosphatemia.  Off calcium acetate Lab Results  Component Value Date   CALCIUM 7.4 (L) 06/14/2019   PHOS 7.9 (H) 06/10/2019  current tube feeds Vital AF Continue renvela powder and calcitriol  5. Acute ischemic stroke Repeat MRI done Remains encephalopathic Neurologist following    LOS: 13 Siri Buege 1/1/202112:42 PM

## 2019-06-15 NOTE — Progress Notes (Signed)
Post HD Tx Note  Pt continued to receive  to receive 0.43mcg/kg/hr dexmedetomidine, 235mcg/hr Fentanyl, Noreepinephrine 61mcg/min and 5% Dextrose 9ml/hr as PO. Pt's BP continued to be WDL throughout the Tx. Pt UF was set to zero. Only 211ml of fluids removal. CVC Heparin locked closed and clamped. Pt tx run for 3hours as prescribed on 2K2.5Ca then when Potassium result came back 3.2 Nephrologist was notified and ordered 4K2.5Ca dialysate    06/15/19 1730  Hand-Off documentation  Report given to (Full Name) Vita Erm RN   Report received from (Full Name) Newt Minion RN   Vital Signs  Temp 98.8 F (37.1 C)  Temp Source Oral  Pulse Rate 76  Resp 14  BP (!) 156/61  Oxygen Therapy  SpO2 100 %  O2 Device Ventilator  O2 Flow Rate (L/min) 40 L/min  End [place] CO2 (EtCO2) 40  Pain Assessment  Pain Scale Faces  Pain Score 0  Post-Hemodialysis Assessment  Rinseback Volume (mL) 250 mL  KECN 54.8 V  Dialyzer Clearance Lightly streaked  Duration of HD Treatment -hour(s) 3 hour(s)  Hemodialysis Intake (mL) 750 mL  UF Total -Machine (mL) 1000 mL  Net UF (mL) 250 mL  Tolerated HD Treatment Yes  Hemodialysis Catheter Left Femoral vein Triple lumen Temporary (Non-Tunneled)  Placement Date/Time: 06/03/2019 1414   Time Out: Correct patient;Correct site;Correct procedure  Maximum sterile barrier precautions: Hand hygiene;Cap;Mask;Sterile gown;Sterile gloves;Large sterile sheet  Site Prep: Chlorhexidine (preferred)  Local Anes...  Site Condition No complications  Blue Lumen Status Heparin locked  Red Lumen Status Heparin locked  Purple Lumen Status Capped (Central line)  Catheter fill solution Heparin 1000 units/ml  Catheter fill volume (Arterial) 1.8 cc  Catheter fill volume (Venous) 1.8  Dressing Type Biopatch  Dressing Status Dry;Intact;Clean  Interventions Dressing reinforced  Drainage Description None  Dressing Change Due 06/18/19  Post treatment catheter status Capped and Clamped

## 2019-06-15 DEATH — deceased

## 2019-06-16 LAB — GLUCOSE, CAPILLARY
Glucose-Capillary: 186 mg/dL — ABNORMAL HIGH (ref 70–99)
Glucose-Capillary: 151 mg/dL — ABNORMAL HIGH (ref 70–99)
Glucose-Capillary: 179 mg/dL — ABNORMAL HIGH (ref 70–99)
Glucose-Capillary: 201 mg/dL — ABNORMAL HIGH (ref 70–99)
Glucose-Capillary: 203 mg/dL — ABNORMAL HIGH (ref 70–99)

## 2019-06-16 LAB — CBC WITH DIFFERENTIAL/PLATELET
Abs Immature Granulocytes: 0.05 10*3/uL (ref 0.00–0.07)
Basophils Absolute: 0 10*3/uL (ref 0.0–0.1)
Basophils Relative: 1 %
Eosinophils Absolute: 0.2 10*3/uL (ref 0.0–0.5)
Eosinophils Relative: 3 %
HCT: 24.1 % — ABNORMAL LOW (ref 39.0–52.0)
Hemoglobin: 7.3 g/dL — ABNORMAL LOW (ref 13.0–17.0)
Immature Granulocytes: 1 %
Lymphocytes Relative: 11 %
Lymphs Abs: 0.9 10*3/uL (ref 0.7–4.0)
MCH: 30.8 pg (ref 26.0–34.0)
MCHC: 30.3 g/dL (ref 30.0–36.0)
MCV: 101.7 fL — ABNORMAL HIGH (ref 80.0–100.0)
Monocytes Absolute: 1 10*3/uL (ref 0.1–1.0)
Monocytes Relative: 12 %
Neutro Abs: 6.3 10*3/uL (ref 1.7–7.7)
Neutrophils Relative %: 72 %
Platelets: 327 10*3/uL (ref 150–400)
RBC: 2.37 MIL/uL — ABNORMAL LOW (ref 4.22–5.81)
RDW: 14.6 % (ref 11.5–15.5)
WBC: 8.4 10*3/uL (ref 4.0–10.5)
nRBC: 0 % (ref 0.0–0.2)

## 2019-06-16 LAB — CULTURE, BLOOD (ROUTINE X 2)
Culture: NO GROWTH
Culture: NO GROWTH
Special Requests: ADEQUATE
Special Requests: ADEQUATE

## 2019-06-16 LAB — BASIC METABOLIC PANEL
Anion gap: 12 (ref 5–15)
BUN: 93 mg/dL — ABNORMAL HIGH (ref 6–20)
CO2: 25 mmol/L (ref 22–32)
Calcium: 7.4 mg/dL — ABNORMAL LOW (ref 8.9–10.3)
Chloride: 98 mmol/L (ref 98–111)
Creatinine, Ser: 7.34 mg/dL — ABNORMAL HIGH (ref 0.61–1.24)
GFR calc Af Amer: 9 mL/min — ABNORMAL LOW (ref >60)
GFR calc non Af Amer: 8 mL/min — ABNORMAL LOW (ref >60)
Glucose, Bld: 208 mg/dL — ABNORMAL HIGH (ref 70–99)
Potassium: 4.5 mmol/L (ref 3.5–5.1)
Sodium: 135 mmol/L (ref 135–145)

## 2019-06-16 MED ORDER — CHLORHEXIDINE GLUCONATE 0.12 % MT SOLN
OROMUCOSAL | Status: AC
  Filled 2019-06-16: qty 15

## 2019-06-16 MED ORDER — NOREPINEPHRINE 16 MG/250ML-% IV SOLN
0.0000 ug/min | INTRAVENOUS | Status: DC
  Filled 2019-06-16: qty 250

## 2019-06-16 NOTE — Progress Notes (Signed)
Subjective: Remains intubated and sedated.    Objective: Current vital signs: BP (!) 138/50   Pulse 88   Temp 99.1 F (37.3 C) (Oral)   Resp (!) 21   Ht 5' 7.99" (1.727 m)   Wt 122.7 kg   SpO2 96%   BMI 41.14 kg/m  Vital signs in last 24 hours: Temp:  [97.7 F (36.5 C)-99.2 F (37.3 C)] 99.1 F (37.3 C) (01/02 0400) Pulse Rate:  [73-92] 88 (01/02 0700) Resp:  [10-21] 21 (01/02 0700) BP: (99-197)/(39-87) 138/50 (01/02 0700) SpO2:  [96 %-100 %] 96 % (01/02 0812) FiO2 (%):  [28 %-40 %] 28 % (01/02 0812)  Intake/Output from previous day: 01/01 0701 - 01/02 0700 In: 1340.4 [I.V.:1140.4; IV Piggyback:200] Out: 250  Intake/Output this shift: No intake/output data recorded. Nutritional status:  Diet Order    None      Neurologic Exam: Mental Status: Intubated and sedated.  Does not respond to sternal rub.  Questionably squeezes left hand to command.     Cranial Nerves: II: Pupils 73m and reactive.  Does not blink to bilateral confrontation III,IV, VI: Eyes deviated upward and to the left.  Intact oculocephalic maneuver V,VII: Corneals intact bilaterally VIII: unable to test IX,X: unable to test XI: unable to test XII: unable to test Motor: Some movement noted in both upper extremities although minimal.  No movement noted in the lower extremities.   Sensory: Does not respond to noxious stimuli in any extremity  Lab Results: Basic Metabolic Panel: Recent Labs  Lab 06/10/19 0540 06/12/19 0513 06/13/19 0453 06/14/19 0454 06/15/19 1454 06/16/19 0347  NA 135 141 140 141 137 135  K 4.0 3.8 4.2 3.9 3.2* 4.5  CL 97* 97* 96* 98 97* 98  CO2 '23 26 25 27 29 25  '$ GLUCOSE 216* 217* 232* 179* 149* 208*  BUN 84* 96* 109* 87* 61* 93*  CREATININE 6.34* 7.17* 8.94* 7.07* 4.94* 7.34*  CALCIUM 7.4* 7.6* 7.3* 7.4* 8.0* 7.4*  MG 2.5*  --  2.6*  --   --   --   PHOS 7.9*  --   --   --   --   --     Liver Function Tests: No results for input(s): AST, ALT, ALKPHOS, BILITOT,  PROT, ALBUMIN in the last 168 hours. No results for input(s): LIPASE, AMYLASE in the last 168 hours. No results for input(s): AMMONIA in the last 168 hours.  CBC: Recent Labs  Lab 06/11/19 0551 06/12/19 0513 06/13/19 0453 06/14/19 0454 06/15/19 1454 06/16/19 0347  WBC 8.9 9.2 11.2* 8.9 10.0 8.4  NEUTROABS 5.1 6.0  --   --  7.9* 6.3  HGB 7.9* 8.8* 8.2* 7.1* 8.0* 7.3*  HCT 25.2* 28.6* 26.2* 24.4* 25.8* 24.1*  MCV 100.0 104.4* 100.4* 105.2* 100.0 101.7*  PLT 234 303 291 308 336 327    Cardiac Enzymes: No results for input(s): CKTOTAL, CKMB, CKMBINDEX, TROPONINI in the last 168 hours.  Lipid Panel: No results for input(s): CHOL, TRIG, HDL, CHOLHDL, VLDL, LDLCALC in the last 168 hours.  CBG: Recent Labs  Lab 06/15/19 1606 06/15/19 2041 06/15/19 2356 06/16/19 0323 06/16/19 0803  GLUCAP 147* 160* 194* 186* 151*    Microbiology: Results for orders placed or performed during the hospital encounter of 05/23/2019  Respiratory Panel by RT PCR (Flu A&B, Covid) - Nasopharyngeal Swab     Status: None   Collection Time: 06/08/2019  2:35 AM   Specimen: Nasopharyngeal Swab  Result Value Ref Range Status  SARS Coronavirus 2 by RT PCR NEGATIVE NEGATIVE Final    Comment: (NOTE) SARS-CoV-2 target nucleic acids are NOT DETECTED. The SARS-CoV-2 RNA is generally detectable in upper respiratoy specimens during the acute phase of infection. The lowest concentration of SARS-CoV-2 viral copies this assay can detect is 131 copies/mL. A negative result does not preclude SARS-Cov-2 infection and should not be used as the sole basis for treatment or other patient management decisions. A negative result may occur with  improper specimen collection/handling, submission of specimen other than nasopharyngeal swab, presence of viral mutation(s) within the areas targeted by this assay, and inadequate number of viral copies (<131 copies/mL). A negative result must be combined with  clinical observations, patient history, and epidemiological information. The expected result is Negative. Fact Sheet for Patients:  PinkCheek.be Fact Sheet for Healthcare Providers:  GravelBags.it This test is not yet ap proved or cleared by the [place] FDA and  has been authorized for detection and/or diagnosis of SARS-CoV-2 by FDA under an Emergency Use Authorization (EUA). This EUA will remain  in effect (meaning this test can be used) for the duration of the COVID-19 declaration under Section 564(b)(1) of the Act, 21 U.S.C. section 360bbb-3(b)(1), unless the authorization is terminated or revoked sooner.    Influenza A by PCR NEGATIVE NEGATIVE Final   Influenza B by PCR NEGATIVE NEGATIVE Final    Comment: (NOTE) The Xpert Xpress SARS-CoV-2/FLU/RSV assay is intended as an aid in  the diagnosis of influenza from Nasopharyngeal swab specimens and  should not be used as a sole basis for treatment. Nasal washings and  aspirates are unacceptable for Xpert Xpress SARS-CoV-2/FLU/RSV  testing. Fact Sheet for Patients: PinkCheek.be Fact Sheet for Healthcare Providers: GravelBags.it This test is not yet approved or cleared by the [place] FDA and  has been authorized for detection and/or diagnosis of SARS-CoV-2 by  FDA under an Emergency Use Authorization (EUA). This EUA will remain  in effect (meaning this test can be used) for the duration of the  Covid-19 declaration under Section 564(b)(1) of the Act, 21  U.S.C. section 360bbb-3(b)(1), unless the authorization is  terminated or revoked. Performed at [facility], [address] [place], [place] [address]   Culture, blood (routine x 2)     Status: None   Collection Time: 06/10/2019  2:36 AM   Specimen: BLOOD  Result Value Ref Range Status   Specimen Description BLOOD LEFT ANTECUBITAL  Final    Special Requests   Final    BOTTLES DRAWN AEROBIC AND ANAEROBIC Blood Culture adequate volume   Culture   Final    NO GROWTH 5 DAYS Performed at [facility], [address] [place], [place] [address]    Report Status 06/07/2019 FINAL  Final  Culture, blood (routine x 2)     Status: None   Collection Time: 06/07/2019  2:36 AM   Specimen: BLOOD  Result Value Ref Range Status   Specimen Description BLOOD RIGHT FOREARM  Final   Special Requests   Final    BOTTLES DRAWN AEROBIC AND ANAEROBIC Blood Culture adequate volume   Culture   Final    NO GROWTH 5 DAYS Performed at [facility], [address], [place], [place] [address]    Report Status 06/07/2019 FINAL  Final  Culture, blood (routine x 2)     Status: None   Collection Time: 05/17/2019  3:10 PM   Specimen: BLOOD  Result Value Ref Range Status   Specimen Description  BLOOD A-LINE  Final   Special Requests   Final    BOTTLES DRAWN AEROBIC AND ANAEROBIC Blood Culture results may not be optimal due to an excessive volume of blood received in culture bottles   Culture   Final    NO GROWTH 5 DAYS Performed at [facility], [address] [place], [place] [address]    Report Status 06/07/2019 FINAL  Final  Culture, blood (routine x 2)     Status: None   Collection Time: 05/27/2019  3:10 PM   Specimen: BLOOD  Result Value Ref Range Status   Specimen Description BLOOD A-LINE  Final   Special Requests   Final    BOTTLES DRAWN AEROBIC AND ANAEROBIC Blood Culture results may not be optimal due to an excessive volume of blood received in culture bottles   Culture   Final    NO GROWTH 5 DAYS Performed at [facility], [address] [place], [place] [address]    Report Status 06/07/2019 FINAL  Final  MRSA PCR Screening     Status: None   Collection Time: 06/03/19  5:45 PM   Specimen: Nasopharyngeal  Result Value Ref Range Status   MRSA by PCR NEGATIVE NEGATIVE Final    Comment:        The  GeneXpert MRSA Assay (FDA approved for NASAL specimens only), is one component of a comprehensive MRSA colonization surveillance program. It is not intended to diagnose MRSA infection nor to guide or monitor treatment for MRSA infections. Performed at [facility], [address] [place], [place] [address]   Urine Culture     Status: None   Collection Time: 06/03/19  6:20 PM   Specimen: Urine, Catheterized  Result Value Ref Range Status   Specimen Description   Final    URINE, CATHETERIZED Performed at [facility], [address] [place], [place] [address]    Special Requests   Final    Immunocompromised Performed at [facility], [address] [place], [place] [address]    Culture   Final    NO GROWTH Performed at [facility], [place] [address] [place], [place] [address]    Report Status 06/05/2019 FINAL  Final  Cath Tip Culture     Status: None   Collection Time: 05/28/2019  4:32 PM   Specimen: Catheter Tip; Other  Result Value Ref Range Status   Specimen Description   Final    CATH TIP Performed at [facility], [address], [place], [place] [address]    Special Requests   Final    NONE Performed at [facility], [address] [place], [place] [address]    Culture   Final    NO GROWTH 2 DAYS Performed at [facility], [place] [address], [place], [place] [address]    Report Status 06/07/2019 FINAL  Final  Culture, respiratory (non-expectorated)     Status: None   Collection Time: 06/06/19 10:22 AM   Specimen: Tracheal Aspirate; Respiratory  Result Value Ref Range Status   Specimen Description   Final    TRACHEAL ASPIRATE Performed at [facility], [address], [place], [place] [address]    Special Requests   Final    NONE Performed at [facility], [address] [place],  [address]    Gram Stain   Final    RARE WBC PRESENT, PREDOMINANTLY PMN NO ORGANISMS  SEEN Performed at [facility], [address] [place], [place] [address]    Culture   Final    RARE CANDIDA ALBICANS RARE FUNGUS (MOLD) ISOLATED, PROBABLE CONTAMINANT/COLONIZER (SAPROPHYTE). CONTACT MICROBIOLOGY IF FURTHER IDENTIFICATION REQUIRED [phone].    Report Status 06/09/2019 FINAL  Final  CULTURE, BLOOD (ROUTINE X 2) w Reflex to ID Panel     Status: None   Collection Time: 06/11/19  1:09 PM   Specimen: BLOOD  Result Value Ref Range Status   Specimen Description BLOOD A-LINE DRAW  Final   Special Requests   Final    BOTTLES DRAWN AEROBIC AND ANAEROBIC Blood Culture adequate volume   Culture   Final    NO GROWTH 5 DAYS Performed at [facility], [address] [place], [place] [address]    Report Status 06/16/2019 FINAL  Final  CULTURE, BLOOD (ROUTINE X 2) w Reflex to ID Panel     Status: None   Collection Time: 06/11/19  1:10 PM   Specimen: BLOOD  Result Value Ref Range Status   Specimen Description BLOOD A-LINE DRAW  Final   Special Requests   Final    BOTTLES DRAWN AEROBIC AND ANAEROBIC Blood Culture adequate volume   Culture   Final    NO GROWTH 5 DAYS Performed at [facility], [address], [place], [place] [address]    Report Status 06/16/2019 FINAL  Final  Culture, respiratory (non-expectorated)     Status: None   Collection Time: 06/11/19  6:04 PM   Specimen: Tracheal Aspirate; Respiratory  Result Value Ref Range Status   Specimen Description   Final    TRACHEAL ASPIRATE Performed at [facility], [address], [place], [place] [address]    Special Requests   Final    NONE Performed at [facility], [address] [place], [place] [address]    Gram Stain   Final    MODERATE WBC PRESENT, PREDOMINANTLY PMN FEW YEAST Performed at [facility], [place] [address] [place], [place] [address]    Culture FEW CANDIDA ALBICANS  Final   Report Status 06/14/2019 FINAL  Final    Coagulation  Studies: No results for input(s): LABPROT, INR in the last 72 hours.  Imaging: EEG  Result Date: 06/14/2019 [name], MD     06/14/2019  2:46 PM ELECTROENCEPHALOGRAM REPORT Patient: [name]       Room #: IC16A-AA EEG No. ID: [id number] Age: 49 y.o.        Sex: male Referring Physician: Kasa Report Date:  06/14/2019       Interpreting Physician: [name] History: Keaden Gunnoe is an 49 y.o. male with altered mental status and eye deviation Medications: ASA, Lipitor, Plavix, Epogen, Insulin, Keppra, Renvela, Fentanyl Conditions of Recording:  This is a 21 channel routine scalp EEG performed with bipolar and monopolar montages arranged in accordance to the international 10/20 system of electrode placement. One channel was dedicated to EKG recording. The patient is in the intubated and sedated state. Description:  Artifact is prominent during the recording producing a fast beta activity that is superimposed on a slow background that consists of a very low voltage polymorphic delta activity.  This is diffusely distributed and persistent throughout the recording.  No epileptiform activity is noted. Hyperventilation and intermittent photic stimulation were not performed. IMPRESSION: This is an abnormal EEG secondary to general background slowing.  This finding may be seen with a diffuse disturbance that is etiologically nonspecific, but may include a metabolic encephalopathy  or medication effect, among other possibilities.  No epileptiform activity is noted.  [name], MD Neurology [phone] 06/14/2019, 2:32 PM   DG Chest Port 1 View  Result Date: 06/15/2019 CLINICAL DATA:  Pulmonary disease. EXAM: PORTABLE CHEST 1 VIEW COMPARISON:  June 13, 2019. FINDINGS: Stable cardiomediastinal silhouette. Endotracheal and nasogastric tubes are unchanged in position. No pneumothorax is noted. Hypoinflation of the lungs is noted. Mild left basilar atelectasis or infiltrate is noted with probable  small pleural effusion. Minimal right basilar subsegmental atelectasis is noted. Bony thorax is unremarkable. IMPRESSION: Stable support apparatus. Mild left basilar atelectasis or infiltrate is noted with probable small pleural effusion. Minimal right basilar subsegmental atelectasis is noted. Electronically Signed   By: Marijo Conception M.D.   On: 06/15/2019 14:48    Medications:  I have reviewed the patient's current medications. Scheduled: .  stroke: mapping our early stages of recovery book   Does not apply Once  . aspirin  324 mg Per Tube Daily  . atorvastatin  80 mg Per Tube q morning - 10a  . B-complex with vitamin C  1 tablet Per Tube Daily  . calcitRIOL  0.25 mcg Per Tube Daily  . chlorhexidine gluconate (MEDLINE KIT)  15 mL Mouth Rinse BID  . Chlorhexidine Gluconate Cloth  6 each Topical Q0600  . clopidogrel  75 mg Per Tube Daily  . epoetin (EPOGEN/PROCRIT) injection  10,000 Units Intravenous Q M,W,F-HD  . feeding supplement (PRO-STAT SUGAR FREE 64)  60 mL Per Tube TID  . feeding supplement (VITAL HIGH PROTEIN)  1,000 mL Per Tube Q24H  . insulin aspart  0-9 Units Subcutaneous Q4H  . ipratropium-albuterol  3 mL Nebulization TID  . mouth rinse  15 mL Mouth Rinse 10 times per day  . pantoprazole sodium  40 mg Per Tube Daily  . senna-docusate  1 tablet Per Tube BID  . sevelamer carbonate  0.8 g Oral TID WC  . sodium chloride flush  10-40 mL Intracatheter Q12H    Assessment/Plan: 49 y.o.malewith a history of DM, HTN and ESRDadmitted afteran episode of syncope and seizure like activity. Patient with multiple metabolic abnormalities/infection at presentation that may have provoked seizure like activity but MRI imaging performed as well and on review shows multiple acute, small infarcts bilaterally. MRA unremarkable. Embolic etiology likely.EEG was only significant for slowing. Patient further declined from a respiratory standpoint requiring intubation and has had difficulty  weaning. Neurological status has declined as well. Unclear if this is related to continued multiple medical issues or if he has had further ischemic events. Patient on ASA and Plavixbut has had some time off antiplatelet therapy due to bleeding. MRI of the brain repeated on 12/31 and shows increase in acute infarcts in the same distribution as previous imaging of 12/19. Repeat EEG only significant for slowing.  Questionably following some commands today.  Family making decisions about long term management.    Recommendations: 1. Will continue to follow with you    LOS: 14 days   [name], MD Neurology [phone] 06/16/2019  8:55 AM

## 2019-06-16 NOTE — Progress Notes (Signed)
CRITICAL CARE PROGRESS NOTE    Name: Jake Gomez MRN: [id number] DOB: 02/15/1971     LOS: 34   SUBJECTIVE FINDINGS & SIGNIFICANT EVENTS   Patient description:  [name] is a 49 yo male with a history of T2DM, HTN, OSA, recent COVID-19 infection and ESRD on dialysis M/W/F.  He presented to the ED on 12/19 for generalized malaise after missing dialysis on Wednesday and Friday for a trip to [place].  He then had a CVA.    Currently experiencing acute hypoxic respiratory failure.     Lines / Drains: Hemodialysis catheter on Right IJ Hemodialysis catheter left femoral NG tube 8 mm ET tube airway CVC triple lumen  Cultures / Sepsis markers: 12/28: Tracheal aspirate demonstrates moderate WBC with PMNs and few yeast.  Reincubated for better growth.    Antibiotics: Unasyn 3 g at 200 mL/hr   Protocols / Consultants: Neurology Cardiology Nephrology  Tests / Events: 12/19: presented to the ED for malaise after missing 2 dialysis sessions.  Potassium 6.4, Creatinine 15.4.  Acute ischemic CVA, with negative MR angiogram.   12/20: CT angio chest demonstrates consolidation in the left lower lobe.  No evidence of PE.    12/29  Has become diaphoretic this AM with weaning fentanyl, and episodes of tachypnea over-riding ventilator.  Meeting in person with family  12/30- relatively unchanged with hemineglect, s/p repeat MRI -increased size brain infarcts 12/31- family meeting this am with Neurology, plan for repeat EEG.  1/2 - no impromevent in mentation, continuing plan for end of life care decisions with family. Repeat CXR today to eval interval changes post tx for asp pna. Repeat MRI discussed with family.      PAST MEDICAL HISTORY   Past Medical History:  Diagnosis Date  . Diabetes mellitus without  complication ([place])   . Hypertension   . Renal disorder   . Sleep apnea    can't afford CPAP     SURGICAL HISTORY   Past Surgical History:  Procedure Laterality Date  . AV FISTULA PLACEMENT Left 01/31/2019   Procedure: ARTERIOVENOUS (AV) FISTULA CREATION ( BRACHIOCEPHALIC );  Surgeon: Algernon Huxley, MD;  Location: [facility];  Service: Vascular;  Laterality: Left;  . DIALYSIS/PERMA CATHETER INSERTION N/A 07/19/2018   Procedure: DIALYSIS/PERMA CATHETER INSERTION;  Surgeon: Algernon Huxley, MD;  Location: [facility];  Service: Cardiovascular;  Laterality: N/A;  . DIALYSIS/PERMA CATHETER INSERTION N/A 09/13/2018   Procedure: DIALYSIS/PERMA CATHETER INSERTION;  Surgeon: Katha Cabal, MD;  Location: [facility];  Service: Cardiovascular;  Laterality: N/A;  . DIALYSIS/PERMA CATHETER REMOVAL N/A 06/03/2019   Procedure: DIALYSIS/PERMA CATHETER REMOVAL, temporary dialysis catheter placement;  Surgeon: Algernon Huxley, MD;  Location: [facility];  Service: Cardiovascular;  Laterality: N/A;  . peritonal dialysis    . REMOVAL OF A DIALYSIS CATHETER N/A 07/17/2018   Procedure: REMOVAL OF A DIALYSIS CATHETER;  Surgeon: Algernon Huxley, MD;  Location: [facility];  Service: Vascular;  Laterality: N/A;     FAMILY HISTORY   Family History  Problem Relation Age of Onset  . Thyroid disease Mother   . Stroke Father   . Cancer Father   . Diabetes Father      SOCIAL HISTORY   Social History   Tobacco Use  . Smoking status: Former Smoker    Quit date: 11/14/2009    Years since quitting: 9.5  . Smokeless tobacco: Former Systems developer    Quit date: 07/16/2009  Substance  Use Topics  . Alcohol use: No  . Drug use: Not Currently    Comment: history 10 years ago   No urine production     MEDICATIONS   Current Medication:  Current Facility-Administered Medications:  .   stroke: mapping our early stages of recovery book, , Does not apply, Once, Sharion Settler, NP .  0.9 %  sodium  chloride infusion, 250 mL, Intravenous, Continuous, Wyvonnia Dusky, MD, Last Rate: 5 mL/hr at 06/16/19 0400, Rate Verify at 06/16/19 0400 .  acetaminophen (TYLENOL) tablet 650 mg, 650 mg, Per Tube, Q6H PRN **OR** acetaminophen (TYLENOL) suppository 650 mg, 650 mg, Rectal, Q6H PRN, Gerald Dexter, RPH .  aspirin chewable tablet 324 mg, 324 mg, Per Tube, Daily, Flora Lipps, MD, 324 mg at 06/15/19 0929 .  atorvastatin (LIPITOR) tablet 80 mg, 80 mg, Per Tube, q morning - 10a, Gerald Dexter, RPH, 80 mg at 06/15/19 1809 .  B-complex with vitamin C tablet 1 tablet, 1 tablet, Per Tube, Daily, Flora Lipps, MD, 1 tablet at 06/15/19 0930 .  calcitRIOL (ROCALTROL) 1 MCG/ML solution 0.25 mcg, 0.25 mcg, Per Tube, Daily, Murlean Iba, MD, 0.25 mcg at 06/15/19 0929 .  chlorhexidine gluconate (MEDLINE KIT) (PERIDEX) 0.12 % solution 15 mL, 15 mL, Mouth Rinse, BID, Kasa, Kurian, MD, 15 mL at 06/16/19 0856 .  Chlorhexidine Gluconate Cloth 2 % PADS 6 each, 6 each, Topical, Q0600, Liana Gerold, MD, 6 each at 06/15/19 0934 .  clopidogrel (PLAVIX) tablet 75 mg, 75 mg, Per Tube, Daily, Ottie Glazier, MD, 75 mg at 06/15/19 0929 .  dexmedetomidine (PRECEDEX) 400 MCG/100ML (4 mcg/mL) infusion, 0.4-1.2 mcg/kg/hr, Intravenous, Titrated, Darel Hong D, NP, Last Rate: 12.27 mL/hr at 06/16/19 0615, 0.4 mcg/kg/hr at 06/16/19 0615 .  epoetin alfa (EPOGEN) injection 10,000 Units, 10,000 Units, Intravenous, Q M,W,F-HD, Murlean Iba, MD, 10,000 Units at 06/15/19 1620 .  feeding supplement (PRO-STAT SUGAR FREE 64) liquid 60 mL, 60 mL, Per Tube, TID, Flora Lipps, MD, 60 mL at 06/15/19 2214 .  feeding supplement (VITAL HIGH PROTEIN) liquid 1,000 mL, 1,000 mL, Per Tube, Q24H, Kasa, Kurian, MD, 1,000 mL at 06/15/19 1809 .  fentaNYL (SUBLIMAZE) bolus via infusion 50 mcg, 50 mcg, Intravenous, Q15 min PRN, Darel Hong D, NP .  fentaNYL 2554mg in NS 2556m(1071mml) infusion-PREMIX, 0-400 mcg/hr,  Intravenous, Continuous, Kasa, Kurian, MD, Last Rate: 25 mL/hr at 06/16/19 0400, 250 mcg/hr at 06/16/19 0400 .  heparin injection 1,000-6,000 Units, 1,000-6,000 Units, CRRT, PRN, KasMortimer Friesurian, MD .  insulin aspart (novoLOG) injection 0-9 Units, 0-9 Units, Subcutaneous, Q4H, KeeDarel Hong NP, 2 Units at 06/16/19 0848 .  ipratropium-albuterol (DUONEB) 0.5-2.5 (3) MG/3ML nebulizer solution 3 mL, 3 mL, Nebulization, Q6H PRN, WilWyvonnia DuskyD .  ipratropium-albuterol (DUONEB) 0.5-2.5 (3) MG/3ML nebulizer solution 3 mL, 3 mL, Nebulization, TID, KasMortimer Friesurian, MD, 3 mL at 06/16/19 0812 .  labetalol (NORMODYNE) injection 10 mg, 10 mg, Intravenous, Q6H PRN, AleOttie GlazierD .  levETIRAcetam (KEPPRA) IVPB 500 mg/100 mL premix, 500 mg, Intravenous, Q12H, KeeBradly BienenstockP, Stopped at 06/16/19 [id number] MEDLINE mouth rinse, 15 mL, Mouth Rinse, 10 times per day, KasFlora LippsD, 15 mL at 06/16/19 0535 .  midazolam (VERSED) injection 2 mg, 2 mg, Intravenous, Q2H PRN, KeeDarel Hong NP, 2 mg at 06/15/19 2314 .  norepinephrine (LEVOPHED) 16 mg in 250m57memix infusion, 0-40 mcg/min, Intravenous, Titrated, KeenBradly Bienenstock, Stopped at 06/15/19 1754 .  ondansetron (ZOFRAN) tablet 4  mg, 4 mg, Per Tube, Q6H PRN **OR** ondansetron (ZOFRAN) injection 4 mg, 4 mg, Intravenous, Q6H PRN, Gerald Dexter, RPH .  pantoprazole sodium (PROTONIX) 40 mg/20 mL oral suspension 40 mg, 40 mg, Per Tube, Daily, Kasa, Kurian, MD, 40 mg at 06/15/19 0930 .  senna-docusate (Senokot-S) tablet 1 tablet, 1 tablet, Per Tube, BID, Flora Lipps, MD, 1 tablet at 06/15/19 2214 .  sevelamer carbonate (RENVELA) packet 0.8 g, 0.8 g, Oral, TID WC, Candiss Norse, [place], MD, 0.8 g at 06/16/19 0849 .  sodium chloride flush (NS) 0.9 % injection 10-40 mL, 10-40 mL, Intracatheter, Q12H, Kasa, Kurian, MD, 10 mL at 06/15/19 2215 .  sodium chloride flush (NS) 0.9 % injection 10-40 mL, 10-40 mL, Intracatheter, PRN, Flora Lipps, MD .   vecuronium (NORCURON) injection 10 mg, 10 mg, Intravenous, Q1H PRN, Flora Lipps, MD, 10 mg at 06/14/19 1820    ALLERGIES   Patient has no known allergies.    REVIEW OF SYSTEMS   Unable to complete due to severity of patient illness  PHYSICAL EXAMINATION   Vital Signs: Temp:  [97.9 F (36.6 C)-99.2 F (37.3 C)] 99 F (37.2 C) (01/02 0900) Pulse Rate:  [73-92] 74 (01/02 1000) Resp:  [10-21] 16 (01/02 1000) BP: (99-197)/(38-115) 99/38 (01/02 1000) SpO2:  [96 %-100 %] 100 % (01/02 1000) FiO2 (%):  [28 %-40 %] 28 % (01/02 1158)  GENERAL: Patient sedated, intubated and on vent.  He opens eyes in response to verbal stimulation.   HEAD: Normocephalic, atraumatic.  EYES: Pupils equal, round, reactive to light.  Eyes deviated to up and to the left.  No scleral icterus.  MOUTH: Moist mucosal membrane. NECK: Supple. No thyromegaly. No nodules. No JVD.  PULMONARY: Decreased breath sounds in Left lower lobe.  Patient exhibiting episodes of tachypnea overriding the vent.     CARDIOVASCULAR: S1 and S2. Regular rate and rhythm. No murmurs, rubs, or gallops.  GASTROINTESTINAL: Soft, nontender, non-distended. No masses. Positive bowel sounds. No hepatosplenomegaly.  MUSCULOSKELETAL: No swelling, clubbing, or edema.  NEUROLOGIC: Mild distress due to acute illness. Non purposeful movement of bilateral arms, minimal nonpurposeful movements of bilateral legs.  Does not respond to commands.    SKIN:intact,warm,diaphoretic   PERTINENT DATA     Infusions: . sodium chloride 5 mL/hr at 06/16/19 0400  . dexmedetomidine (PRECEDEX) IV infusion 0.4 mcg/kg/hr (06/16/19 0615)  . fentaNYL infusion INTRAVENOUS 250 mcg/hr (06/16/19 0400)  . levETIRAcetam Stopped (06/16/19 0317)  . norepinephrine (LEVOPHED) Adult infusion Stopped (06/15/19 1754)   Scheduled Medications: .  stroke: mapping our early stages of recovery book   Does not apply Once  . aspirin  324 mg Per Tube Daily  . atorvastatin  80 mg  Per Tube q morning - 10a  . B-complex with vitamin C  1 tablet Per Tube Daily  . calcitRIOL  0.25 mcg Per Tube Daily  . chlorhexidine gluconate (MEDLINE KIT)  15 mL Mouth Rinse BID  . Chlorhexidine Gluconate Cloth  6 each Topical Q0600  . clopidogrel  75 mg Per Tube Daily  . epoetin (EPOGEN/PROCRIT) injection  10,000 Units Intravenous Q M,W,F-HD  . feeding supplement (PRO-STAT SUGAR FREE 64)  60 mL Per Tube TID  . feeding supplement (VITAL HIGH PROTEIN)  1,000 mL Per Tube Q24H  . insulin aspart  0-9 Units Subcutaneous Q4H  . ipratropium-albuterol  3 mL Nebulization TID  . mouth rinse  15 mL Mouth Rinse 10 times per day  . pantoprazole sodium  40 mg Per Tube Daily  .  senna-docusate  1 tablet Per Tube BID  . sevelamer carbonate  0.8 g Oral TID WC  . sodium chloride flush  10-40 mL Intracatheter Q12H   PRN Medications: acetaminophen **OR** acetaminophen, fentaNYL, heparin, ipratropium-albuterol, labetalol, midazolam, ondansetron **OR** ondansetron (ZOFRAN) IV, sodium chloride flush, vecuronium Hemodynamic parameters:   Intake/Output: 01/01 0701 - 01/02 0700 In: 1340.4 [I.V.:1140.4; IV Piggyback:200] Out: 250   Ventilator  Settings: Vent Mode: PCV FiO2 (%):  [28 %-40 %] 28 % Set Rate:  [16 bmp] 16 bmp PEEP:  [5 cmH20] 5 cmH20 Plateau Pressure:  [26 cmH20] 26 cmH20   Other Labs:  LAB RESULTS:  Basic Metabolic Panel: Recent Labs  Lab 06/10/19 0540 06/12/19 0513 06/13/19 0453 06/14/19 0454 06/15/19 1454 06/16/19 0347  NA 135 141 140 141 137 135  K 4.0 3.8 4.2 3.9 3.2* 4.5  CL 97* 97* 96* 98 97* 98  CO2 _0 GLUCOSE 216* 217* 232* 179* 149* 208*  BUN 84* 96* 109* 87* 61* 93*  CREATININE 6.34* 7.17* 8.94* 7.07* 4.94* 7.34*  CALCIUM 7.4* 7.6* 7.3* 7.4* 8.0* 7.4*  MG 2.5*  --  2.6*  --   --   --   PHOS 7.9*  --   --   --   --   --    Liver Function Tests: No results for input(s): AST, ALT, ALKPHOS, BILITOT, PROT, ALBUMIN in the last 168 hours. No results  for input(s): LIPASE, AMYLASE in the last 168 hours. No results for input(s): AMMONIA in the last 168 hours. CBC: Recent Labs  Lab 06/11/19 0551 06/12/19 0513 06/13/19 0453 06/14/19 0454 06/15/19 1454 06/16/19 0347  WBC 8.9 9.2 11.2* 8.9 10.0 8.4  NEUTROABS 5.1 6.0  --   --  7.9* 6.3  HGB 7.9* 8.8* 8.2* 7.1* 8.0* 7.3*  HCT 25.2* 28.6* 26.2* 24.4* 25.8* 24.1*  MCV 100.0 104.4* 100.4* 105.2* 100.0 101.7*  PLT 234 303 291 308 336 327   Cardiac Enzymes: No results for input(s): CKTOTAL, CKMB, CKMBINDEX, TROPONINI in the last 168 hours. BNP: Invalid input(s): POCBNP CBG: Recent Labs  Lab 06/15/19 2041 06/15/19 2356 06/16/19 0323 06/16/19 0803 06/16/19 1155  GLUCAP 160* 194* 186* 151* 179*     IMAGING RESULTS:  Imaging: EEG  Result Date: 06/14/2019 [name], MD     06/14/2019  2:46 PM ELECTROENCEPHALOGRAM REPORT Patient: [name]       Room #: IC16A-AA EEG No. ID: [id number] Age: 49 y.o.        Sex: male Referring Physician: Kasa Report Date:  06/14/2019       Interpreting Physician: [name] History: Moritz Lever is an 49 y.o. male with altered mental status and eye deviation Medications: ASA, Lipitor, Plavix, Epogen, Insulin, Keppra, Renvela, Fentanyl Conditions of Recording:  This is a 21 channel routine scalp EEG performed with bipolar and monopolar montages arranged in accordance to the international 10/20 system of electrode placement. One channel was dedicated to EKG recording. The patient is in the intubated and sedated state. Description:  Artifact is prominent during the recording producing a fast beta activity that is superimposed on a slow background that consists of a very low voltage polymorphic delta activity.  This is diffusely distributed and persistent throughout the recording.  No epileptiform activity is noted. Hyperventilation and intermittent photic stimulation were not performed. IMPRESSION: This is an abnormal EEG secondary to general  background slowing.  This finding may be seen with a diffuse disturbance that is etiologically nonspecific,  but may include a metabolic encephalopathy or medication effect, among other possibilities.  No epileptiform activity is noted.  [name], MD Neurology [phone] 06/14/2019, 2:32 PM   DG Chest Port 1 View  Result Date: 06/15/2019 CLINICAL DATA:  Pulmonary disease. EXAM: PORTABLE CHEST 1 VIEW COMPARISON:  June 13, 2019. FINDINGS: Stable cardiomediastinal silhouette. Endotracheal and nasogastric tubes are unchanged in position. No pneumothorax is noted. Hypoinflation of the lungs is noted. Mild left basilar atelectasis or infiltrate is noted with probable small pleural effusion. Minimal right basilar subsegmental atelectasis is noted. Bony thorax is unremarkable. IMPRESSION: Stable support apparatus. Mild left basilar atelectasis or infiltrate is noted with probable small pleural effusion. Minimal right basilar subsegmental atelectasis is noted. Electronically Signed   By: Marijo Conception M.D.   On: 06/15/2019 14:48      ASSESSMENT AND PLAN    -Multidisciplinary rounds held today  Acute Hypoxic Respiratory Failure with Left Lower Lobe Pneumonia - Left lower lobe pneumonia demonstrated on CT chest 12/20- continue treatment with unasyn - patient previously positive for pseudomonas in 07/2018 on peritoneal catheter.   -continue Full MV support -continue Bronchodilator Therapy -Wean Fio2 and PEEP as tolerated -will perform SAT/SBT when respiratory parameters are met  Acute ischemic CVA - consulting with neurology - MRI head on 12/20 demonstrated patchy multifocal acute ischemic infarcts. MRA head on 12/20 demonstrated no large vessel occlusion, stenosis or acute vascular abnormality.   - EEG consistent with no seizures, diffuse encephalopathy - Carotid ultrasound demonstrates mild occlusion, not clinically significant - ICU telemonitoring - Tachypnea overriding vent, fixed gaze  up and to left, diaphoretic with weaning off fentanyl - Multiple family meetings have already took place - Dr Doy Mince met with family separately as well. They are very appreicative of care and we are forming plans for poss trache/peg vs compassionate weaning from MV   Renal Failure-ESRD -follow chem 7 -follow UO -continue Foley Catheter-assess need daily - last hemodialysis yesterday -Nephrology consulting on case  ID -continue IV abx as prescibed: unasyn -follow up cultures  GI/Nutrition GI PROPHYLAXIS as indicated DIET-->TF's as tolerated  Constipation protocol as indicated  ENDO - ICU hypoglycemic\Hyperglycemia protocol -check FSBS per protocol   ELECTROLYTES -follow labs as needed -replace as needed -pharmacy consultation   DVT/GI PRX ordered -SCDs  TRANSFUSIONS AS NEEDED MONITOR FSBS ASSESS the need for LABS as needed   Critical care provider statement:    Critical care time (minutes):  35   Critical care time was exclusive of:  Separately billable procedures and treating other patients   Critical care was necessary to treat or prevent imminent or life-threatening deterioration of the following conditions:  acute respiratory failure, acute CVA,    Critical care was time spent personally by me on the following activities:  Development of treatment plan with patient or surrogate, discussions with consultants, evaluation of patient's response to treatment, examination of patient, obtaining history from patient or surrogate, ordering and performing treatments and interventions, ordering and review of laboratory studies and re-evaluation of patient's condition.  I assumed direction of critical care for this patient from another provider in my specialty: no    This document was prepared using Dragon voice recognition software and may include unintentional dictation errors.    Ottie Glazier, M.D.  Division of [facility]

## 2019-06-16 NOTE — Progress Notes (Signed)
Jake Gomez  MRN: [id number]  DOB/AGE: 10/21/1970 49 y.o.  Primary Care Physician:Jones, Iven Finn, MD  Admit date: 05/29/2019  Chief Complaint:  Chief Complaint  Patient presents with  . "Not Feeling Right"    S-Pt presented on  06/14/2019 with  Chief Complaint  Patient presents with  . "Not Feeling Right"  . Patient is intubated, sedated, unable to offers any complaints.    Meds .  stroke: mapping our early stages of recovery book   Does not apply Once  . aspirin  324 mg Per Tube Daily  . atorvastatin  80 mg Per Tube q morning - 10a  . B-complex with vitamin C  1 tablet Per Tube Daily  . calcitRIOL  0.25 mcg Per Tube Daily  . chlorhexidine gluconate (MEDLINE KIT)  15 mL Mouth Rinse BID  . Chlorhexidine Gluconate Cloth  6 each Topical Q0600  . clopidogrel  75 mg Per Tube Daily  . epoetin (EPOGEN/PROCRIT) injection  10,000 Units Intravenous Q M,W,F-HD  . feeding supplement (PRO-STAT SUGAR FREE 64)  60 mL Per Tube TID  . feeding supplement (VITAL HIGH PROTEIN)  1,000 mL Per Tube Q24H  . insulin aspart  0-9 Units Subcutaneous Q4H  . ipratropium-albuterol  3 mL Nebulization TID  . mouth rinse  15 mL Mouth Rinse 10 times per day  . pantoprazole sodium  40 mg Per Tube Daily  . senna-docusate  1 tablet Per Tube BID  . sevelamer carbonate  0.8 g Oral TID WC  . sodium chloride flush  10-40 mL Intracatheter Q12H         ROS: Unable to get any data as patient is intubated    Physical Exam: Vital signs in last 24 hours: Temp:  [97.7 F (36.5 C)-99.2 F (37.3 C)] 99 F (37.2 C) (01/02 0900) Pulse Rate:  [73-92] 91 (01/02 0900) Resp:  [10-21] 16 (01/02 0900) BP: (99-197)/(41-87) 138/50 (01/02 0700) SpO2:  [96 %-100 %] 100 % (01/02 0900) FiO2 (%):  [28 %-40 %] 28 % (01/02 0812) Weight change:  Last BM Date: 06/15/19  Intake/Output from previous day: 01/01 0701 - 01/02 0700 In: 1340.4 [I.V.:1140.4; IV Piggyback:200] Out: 250  No intake/output data  recorded.   Physical Exam: General- pt is intubated, sedated Resp- ET tube in situ, Rhonchi present CVS- S1S2 regular in rate and rhythm GIT- BS+, soft, NT, ND EXT- 1+ LE Edema, Cyanosis Access-temporary catheter, left femoral   and AVF Patient has left AV fistula which is not mature enough for use yet  Lab Results: CBC Recent Labs    06/15/19 1454 06/16/19 0347  WBC 10.0 8.4  HGB 8.0* 7.3*  HCT 25.8* 24.1*  PLT 336 327    BMET Recent Labs    06/15/19 1454 06/16/19 0347  NA 137 135  K 3.2* 4.5  CL 97* 98  CO2 29 25  GLUCOSE 149* 208*  BUN 61* 93*  CREATININE 4.94* 7.34*  CALCIUM 8.0* 7.4*    MICRO Recent Results (from the past 240 hour(s))  CULTURE, BLOOD (ROUTINE X 2) w Reflex to ID Panel     Status: None   Collection Time: 06/11/19  1:09 PM   Specimen: BLOOD  Result Value Ref Range Status   Specimen Description BLOOD A-LINE DRAW  Final   Special Requests   Final    BOTTLES DRAWN AEROBIC AND ANAEROBIC Blood Culture adequate volume   Culture   Final    NO GROWTH 5 DAYS Performed at [facility], [address] [place], [place] [address]    Report Status 06/16/2019 FINAL  Final  CULTURE, BLOOD (ROUTINE X 2) w Reflex to ID Panel     Status: None   Collection Time: 06/11/19  1:10 PM   Specimen: BLOOD  Result Value Ref Range Status   Specimen Description BLOOD A-LINE DRAW  Final   Special Requests   Final    BOTTLES DRAWN AEROBIC AND ANAEROBIC Blood Culture adequate volume   Culture   Final    NO GROWTH 5 DAYS Performed at [facility], [address], [place], [place] [address]    Report Status 06/16/2019 FINAL  Final  Culture, respiratory (non-expectorated)     Status: None   Collection Time: 06/11/19  6:04 PM   Specimen: Tracheal Aspirate; Respiratory  Result Value Ref Range Status   Specimen Description   Final    TRACHEAL ASPIRATE Performed at [facility], [address], [place], [place] [address]    Special  Requests   Final    NONE Performed at [facility], [address] [place],  [address]    Gram Stain   Final    MODERATE WBC PRESENT, PREDOMINANTLY PMN FEW YEAST Performed at [facility], [place] [address], [place],  [address]    Culture FEW CANDIDA ALBICANS  Final   Report Status 06/14/2019 FINAL  Final      Lab Results  Component Value Date   PTH 19 07/19/2018   CALCIUM 7.4 (L) 06/16/2019   PHOS 7.9 (H) 06/10/2019               Impression: 1)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  During the current admission patient required CRRT from December 21 to the December 24 secondary to hypotension Patient is currently on hemodialysis through temporary sis catheter  Patient was last dialyzed yesterday on Friday     2) hypotension Pt required pressor support Now BP stable  3)Anemia of chronic disease  HGb is not  goal (9--11) Patient is on Epogen during dialysis   4) secondary hyperparathyroidism CKD Mineral-Bone Disorder  Secondary Hyperparathyroidism  present  Phosphorus is not at goal. Patient is on binders  5) hypokalemia Now better   6)Acid base Co2 at goal  7)  acute respiratory failure Patient is currently admitted with the acute respiratory failure requiring intubation and mechanical ventilation Patient will possibly need tracheostomy Pulmonary following   8) altered mental status Most likely multifactorial CVA and  metabolic encephalopathy. Patient had repeat MRI brain done on 12/31 which showed increasing acute  acute ischemic nonhemorrhagic infarcts Diagnosed on MRI done on December 19    Plan:   No acute need for dialysis today Will most likely dialyze on Monday.    Jake Gomez s  Jake Gomez 06/16/2019, 10:23 AM

## 2019-06-17 DIAGNOSIS — Z978 Presence of other specified devices: Secondary | ICD-10-CM

## 2019-06-17 DIAGNOSIS — J9600 Acute respiratory failure, unspecified whether with hypoxia or hypercapnia: Secondary | ICD-10-CM

## 2019-06-17 LAB — BASIC METABOLIC PANEL
Anion gap: 16 — ABNORMAL HIGH (ref 5–15)
BUN: 114 mg/dL — ABNORMAL HIGH (ref 6–20)
CO2: 24 mmol/L (ref 22–32)
Calcium: 7.6 mg/dL — ABNORMAL LOW (ref 8.9–10.3)
Chloride: 98 mmol/L (ref 98–111)
Creatinine, Ser: 8.85 mg/dL — ABNORMAL HIGH (ref 0.61–1.24)
GFR calc Af Amer: 7 mL/min — ABNORMAL LOW (ref >60)
GFR calc non Af Amer: 6 mL/min — ABNORMAL LOW (ref >60)
Glucose, Bld: 190 mg/dL — ABNORMAL HIGH (ref 70–99)
Potassium: 5 mmol/L (ref 3.5–5.1)
Sodium: 138 mmol/L (ref 135–145)

## 2019-06-17 LAB — CBC WITH DIFFERENTIAL/PLATELET
Abs Immature Granulocytes: 0.06 10*3/uL (ref 0.00–0.07)
Basophils Absolute: 0 10*3/uL (ref 0.0–0.1)
Basophils Relative: 0 %
Eosinophils Absolute: 0.3 10*3/uL (ref 0.0–0.5)
Eosinophils Relative: 4 %
HCT: 24.8 % — ABNORMAL LOW (ref 39.0–52.0)
Hemoglobin: 7.4 g/dL — ABNORMAL LOW (ref 13.0–17.0)
Immature Granulocytes: 1 %
Lymphocytes Relative: 12 %
Lymphs Abs: 1.1 10*3/uL (ref 0.7–4.0)
MCH: 30.1 pg (ref 26.0–34.0)
MCHC: 29.8 g/dL — ABNORMAL LOW (ref 30.0–36.0)
MCV: 100.8 fL — ABNORMAL HIGH (ref 80.0–100.0)
Monocytes Absolute: 1 10*3/uL (ref 0.1–1.0)
Monocytes Relative: 10 %
Neutro Abs: 7.1 10*3/uL (ref 1.7–7.7)
Neutrophils Relative %: 73 %
Platelets: 355 10*3/uL (ref 150–400)
RBC: 2.46 MIL/uL — ABNORMAL LOW (ref 4.22–5.81)
RDW: 14.4 % (ref 11.5–15.5)
WBC: 9.6 10*3/uL (ref 4.0–10.5)
nRBC: 0 % (ref 0.0–0.2)

## 2019-06-17 LAB — GLUCOSE, CAPILLARY
Glucose-Capillary: 177 mg/dL — ABNORMAL HIGH (ref 70–99)
Glucose-Capillary: 179 mg/dL — ABNORMAL HIGH (ref 70–99)
Glucose-Capillary: 182 mg/dL — ABNORMAL HIGH (ref 70–99)
Glucose-Capillary: 187 mg/dL — ABNORMAL HIGH (ref 70–99)
Glucose-Capillary: 192 mg/dL — ABNORMAL HIGH (ref 70–99)
Glucose-Capillary: 201 mg/dL — ABNORMAL HIGH (ref 70–99)
Glucose-Capillary: 216 mg/dL — ABNORMAL HIGH (ref 70–99)

## 2019-06-17 MED ORDER — CHLORHEXIDINE GLUCONATE 0.12 % MT SOLN
OROMUCOSAL | Status: AC
  Filled 2019-06-17: qty 15

## 2019-06-17 NOTE — Progress Notes (Signed)
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 and CVA Unable to wean due to severe brain damage and injury from strokes and inability to protect airway   Vent Mode: PCV FiO2 (%):  [28 %] 28 % Set Rate:  [16 bmp] 16 bmp PEEP:  [5 cmH20] 5 cmH20 Plateau Pressure:  [17 cmH20-20 cmH20] 17 cmH20   BP (!) 112/47 (BP Location: Right Arm)   Pulse 69   Temp 99.1 F (37.3 C) (Axillary)   Resp 15   Ht 5' 7.99" (1.727 m)   Wt 122.7 kg   SpO2 100%   BMI 41.14 kg/m    I/O last 3 completed shifts: In: 4461.4 [I.V.:1722.5; NG/GT:2439; IV Piggyback:299.8] Out: 0  Total I/O In: 91.7 [I.V.:50.8; NG/GT:41] Out: -   SpO2: 100 % O2 Flow Rate (L/min): 40 L/min FiO2 (%): 28 %   SIGNIFICANT EVENTS 12/21 Intubated, resp failure, acute CVA Perm cath removed, vasc cath LEFT groin, Triple lumen RT groin placed 12/22 on CRRT, septic shock, severe hypoxia, on vent, unable to wean due to hemodynamic instability 12/23 attemt neuro assessment today, follow up NEURO recs 12/24 remains on vent, try HD today, wean off CRRT 12/25 extubated for cuff leak and re-intubated 12/26 failed weaning trials due to encephalopathy from CVA and pneumonia 12/27-1/3 failure to wean, severe brain damage, severe resp failure   REVIEW OF SYSTEMS  PATIENT IS UNABLE TO PROVIDE COMPLETE REVIEW OF SYSTEM S DUE TO SEVERE CRITICAL ILLNESS AND ENCEPHALOPATHY   PHYSICAL EXAMINATION:  GENERAL:critically ill appearing, +resp distress HEAD: Normocephalic, atraumatic.  EYES: Pupils equal, round, reactive to light.  No scleral icterus.  MOUTH: Moist mucosal membrane. NECK: Supple. No thyromegaly. No  nodules. No JVD.  PULMONARY: +rhonchi, +wheezing CARDIOVASCULAR: S1 and S2. Regular rate and rhythm. No murmurs, rubs, or gallops.  GASTROINTESTINAL: Soft, nontender, -distended. Positive bowel sounds.  MUSCULOSKELETAL: No swelling, clubbing, or edema.  NEUROLOGIC: obtunded SKIN:intact,warm,dry   MEDICATIONS: I have reviewed all medications and confirmed regimen as documented   CULTURE RESULTS   Recent Results (from the past 240 hour(s))  CULTURE, BLOOD (ROUTINE X 2) w Reflex to ID Panel     Status: None   Collection Time: 06/11/19  1:09 PM   Specimen: BLOOD  Result Value Ref Range Status   Specimen Description BLOOD A-LINE DRAW  Final   Special Requests   Final    BOTTLES DRAWN AEROBIC AND ANAEROBIC Blood Culture adequate volume   Culture   Final    NO GROWTH 5 DAYS Performed at [facility], [address] [place], [place] [address]    Report Status 06/16/2019 FINAL  Final  CULTURE, BLOOD (ROUTINE X 2) w Reflex to ID Panel     Status: None   Collection Time: 06/11/19  1:10 PM   Specimen: BLOOD  Result Value Ref Range Status   Specimen Description BLOOD A-LINE DRAW  Final   Special Requests   Final    BOTTLES DRAWN AEROBIC AND ANAEROBIC Blood Culture adequate volume   Culture   Final    NO GROWTH 5 DAYS Performed at [facility], [address], [place], [place] [address]    Report Status  06/16/2019 FINAL  Final  Culture, respiratory (non-expectorated)     Status: None   Collection Time: 06/11/19  6:04 PM   Specimen: Tracheal Aspirate; Respiratory  Result Value Ref Range Status   Specimen Description   Final    TRACHEAL ASPIRATE Performed at [facility], [address] [place], [place] [address]    Special Requests   Final    NONE Performed at [facility], [address] [place], [place] [address]    Gram Stain   Final    MODERATE WBC PRESENT, PREDOMINANTLY PMN FEW YEAST Performed at [facility], [place]  [address], [place], [place] [address]    Culture FEW CANDIDA ALBICANS  Final   Report Status 06/14/2019 FINAL  Final            Indwelling Urinary Catheter continued, requirement due to   Reason to continue Indwelling Urinary Catheter strict Intake/Output monitoring for hemodynamic instability   Central Line/ continued, requirement due to  Reason to continue [place] of central venous pressure or other hemodynamic parameters and poor IV access   Ventilator continued, requirement due to severe respiratory failure   Ventilator Sedation RASS 0 to -2        ASSESSMENT AND PLAN SYNOPSIS  Severe ACUTE Hypoxic and Hypercapnic Respiratory Failurefrom pneumonia and acute aspirtaion pneumonia with inability to protect airway due to CVA, septic shockwith end-stage renal disease on dialysis with morbid obesity Post Covid pneumonitis and pneumonia with hypercoagulable state with acute CVA   Severe ACUTE Hypoxic and Hypercapnic Respiratory Failure -continue Mechanical Ventilator support -continue Bronchodilator Therapy -Wean Fio2 and PEEP as tolerated -VAP/VENT bundle implementation Will need trach for survival   ent FORT  KIDNEY INJURY/Renal Failure ESRD -follow chem 7 -follow UO -continue Foley Catheter-assess need -Avoid nephrotoxic agents HD AS NEEDED    NEUROLOGY ACUTE CVA AND BRAIN DAMAGE ?TEE IF COMPLETED OR NOT ?ENT CONSULTED OR NOT UNABLE TO SURVIVE WITHOUT TRACH - intubated and sedated - minimal sedation to achieve a RASS goal: -1    INFECTIOUS DISEASE POST COVID 19 INFECTION PNEUMONITIS  WITH ASPIRATION PNEUMONIA AND LLL PNEUMONIA   GI GI PROPHYLAXIS as indicated  NUTRITIONAL STATUS DIET-->TF's as tolerated Constipation protocol as indicated   NEUROLOGY - intubated and sedated - minimal sedation to achieve a RASS goal: -1   ELECTROLYTES -follow labs as needed -replace as needed -pharmacy consultation and following    Critical  Care Time devoted to patient care services described in this note is 35 minutes.   Overall, patient is critically ill, prognosis is guarded.  Patient with Multiorgan failure and at high risk for cardiac arrest and death.   SEVERE BRAIN DAMAGE FROM CVA'S POST COVID PNEUMONITIS WITH HYPERCOAGULABLE STATE WITH SEVERE CVA'S VERY POOR PROGNOSIS  PALLIATIVE CARE DISCUSSION WITH NEURO AND FAMILY  Byrne Capek [name], M.D.  [name]  Medical Director [place] Director [facility] Cardio-Pulmonary Department

## 2019-06-17 NOTE — Progress Notes (Signed)
Subjective: Patient remains intubated and sedated.    Objective: Current vital signs: BP (!) 112/47 (BP Location: Right Arm)   Pulse 69   Temp 99.1 F (37.3 C) (Axillary)   Resp 15   Ht 5' 7.99" (1.727 m)   Wt 122.7 kg   SpO2 100%   BMI 41.14 kg/m  Vital signs in last 24 hours: Temp:  [98.6 F (37 C)-99.1 F (37.3 C)] 99.1 F (37.3 C) (01/03 0800) Pulse Rate:  [66-79] 69 (01/03 0800) Resp:  [15-17] 15 (01/03 0800) BP: (96-175)/(38-63) 112/47 (01/03 0800) SpO2:  [98 %-100 %] 100 % (01/03 0800) FiO2 (%):  [28 %] 28 % (01/03 0330)  Intake/Output from previous day: 01/02 0701 - 01/03 0700 In: 3978.4 [I.V.:1339.5; QZ/ES:9233; IV Piggyback:199.8] Out: 0  Intake/Output this shift: Total I/O In: 91.7 [I.V.:50.8; NG/GT:41] Out: -  Nutritional status:  Diet Order    None      Neurologic Exam: Mental Status: Intubated and sedated. Does not respond to sternal rub. Questionably squeezes right hand to command.    Cranial Nerves: AQ:TMAUQJ 36m and reactive. Does not blink to bilateral confrontation III,IV, VFH:LKTGmidposition.  Intact oculocephalic maneuver V,VII:Corneals intact bilaterally VIII:unable to test IX,X:unable to test XYB:WLSLHTto test XDSK:AJGOTLto test Motor: Some movement noted in both upper extremities although minimal.  No movement noted in the lower extremities.   Sensory:Does not respond to noxious stimuli in any extremity  Lab Results: Basic Metabolic Panel: Recent Labs  Lab 06/13/19 0453 06/14/19 0454 06/15/19 1454 06/16/19 0347 06/17/19 0359  NA 140 141 137 135 138  K 4.2 3.9 3.2* 4.5 5.0  CL 96* 98 97* 98 98  CO2 '25 27 29 25 24  '$ GLUCOSE 232* 179* 149* 208* 190*  BUN 109* 87* 61* 93* 114*  CREATININE 8.94* 7.07* 4.94* 7.34* 8.85*  CALCIUM 7.3* 7.4* 8.0* 7.4* 7.6*  MG 2.6*  --   --   --   --     Liver Function Tests: No results for input(s): AST, ALT, ALKPHOS, BILITOT, PROT, ALBUMIN in the last 168 hours. No results for  input(s): LIPASE, AMYLASE in the last 168 hours. No results for input(s): AMMONIA in the last 168 hours.  CBC: Recent Labs  Lab 06/11/19 0551 06/12/19 0513 06/13/19 0453 06/14/19 0454 06/15/19 1454 06/16/19 0347 06/17/19 0359  WBC 8.9 9.2 11.2* 8.9 10.0 8.4 9.6  NEUTROABS 5.1 6.0  --   --  7.9* 6.3 7.1  HGB 7.9* 8.8* 8.2* 7.1* 8.0* 7.3* 7.4*  HCT 25.2* 28.6* 26.2* 24.4* 25.8* 24.1* 24.8*  MCV 100.0 104.4* 100.4* 105.2* 100.0 101.7* 100.8*  PLT 234 303 291 308 336 327 355    Cardiac Enzymes: No results for input(s): CKTOTAL, CKMB, CKMBINDEX, TROPONINI in the last 168 hours.  Lipid Panel: No results for input(s): CHOL, TRIG, HDL, CHOLHDL, VLDL, LDLCALC in the last 168 hours.  CBG: Recent Labs  Lab 06/16/19 1603 06/16/19 1959 06/16/19 2355 06/17/19 0337 06/17/19 0757  GLUCAP 201* 203* 187* 182* 177*    Microbiology: Results for orders placed or performed during the hospital encounter of 05/26/2019  Respiratory Panel by RT PCR (Flu A&B, Covid) - Nasopharyngeal Swab     Status: None   Collection Time: 05/28/2019  2:35 AM   Specimen: Nasopharyngeal Swab  Result Value Ref Range Status   SARS Coronavirus 2 by RT PCR NEGATIVE NEGATIVE Final    Comment: (NOTE) SARS-CoV-2 target nucleic acids are NOT DETECTED. The SARS-CoV-2 RNA is generally detectable in upper  respiratoy specimens during the acute phase of infection. The lowest concentration of SARS-CoV-2 viral copies this assay can detect is 131 copies/mL. A negative result does not preclude SARS-Cov-2 infection and should not be used as the sole basis for treatment or other patient management decisions. A negative result may occur with  improper specimen collection/handling, submission of specimen other than nasopharyngeal swab, presence of viral mutation(s) within the areas targeted by this assay, and inadequate number of viral copies (<131 copies/mL). A negative result must be combined with clinical observations,  patient history, and epidemiological information. The expected result is Negative. Fact Sheet for Patients:  PinkCheek.be Fact Sheet for Healthcare Providers:  GravelBags.it This test is not yet ap proved or cleared by the [place] FDA and  has been authorized for detection and/or diagnosis of SARS-CoV-2 by FDA under an Emergency Use Authorization (EUA). This EUA will remain  in effect (meaning this test can be used) for the duration of the COVID-19 declaration under Section 564(b)(1) of the Act, 21 U.S.C. section 360bbb-3(b)(1), unless the authorization is terminated or revoked sooner.    Influenza A by PCR NEGATIVE NEGATIVE Final   Influenza B by PCR NEGATIVE NEGATIVE Final    Comment: (NOTE) The Xpert Xpress SARS-CoV-2/FLU/RSV assay is intended as an aid in  the diagnosis of influenza from Nasopharyngeal swab specimens and  should not be used as a sole basis for treatment. Nasal washings and  aspirates are unacceptable for Xpert Xpress SARS-CoV-2/FLU/RSV  testing. Fact Sheet for Patients: PinkCheek.be Fact Sheet for Healthcare Providers: GravelBags.it This test is not yet approved or cleared by the [place] FDA and  has been authorized for detection and/or diagnosis of SARS-CoV-2 by  FDA under an Emergency Use Authorization (EUA). This EUA will remain  in effect (meaning this test can be used) for the duration of the  Covid-19 declaration under Section 564(b)(1) of the Act, 21  U.S.C. section 360bbb-3(b)(1), unless the authorization is  terminated or revoked. Performed at [facility] [address] [place],  [place] [address]   Culture, blood (routine x 2)     Status: None   Collection Time: 06/14/2019  2:36 AM   Specimen: BLOOD  Result Value Ref Range Status   Specimen Description BLOOD LEFT ANTECUBITAL  Final   Special Requests   Final     BOTTLES DRAWN AEROBIC AND ANAEROBIC Blood Culture adequate volume   Culture   Final    NO GROWTH 5 DAYS Performed at [facility], [address], [place], [place] [address]    Report Status 06/07/2019 FINAL  Final  Culture, blood (routine x 2)     Status: None   Collection Time: 05/18/2019  2:36 AM   Specimen: BLOOD  Result Value Ref Range Status   Specimen Description BLOOD RIGHT FOREARM  Final   Special Requests   Final    BOTTLES DRAWN AEROBIC AND ANAEROBIC Blood Culture adequate volume   Culture   Final    NO GROWTH 5 DAYS Performed at [facility], [address], [place], [place] [address]    Report Status 06/07/2019 FINAL  Final  Culture, blood (routine x 2)     Status: None   Collection Time: 05/30/2019  3:10 PM   Specimen: BLOOD  Result Value Ref Range Status   Specimen Description BLOOD A-LINE  Final   Special Requests   Final    BOTTLES DRAWN AEROBIC AND ANAEROBIC Blood Culture results may not be optimal due to an  excessive volume of blood received in culture bottles   Culture   Final    NO GROWTH 5 DAYS Performed at [facility], [address] [place], [place] [address]    Report Status 06/07/2019 FINAL  Final  Culture, blood (routine x 2)     Status: None   Collection Time: 05/19/2019  3:10 PM   Specimen: BLOOD  Result Value Ref Range Status   Specimen Description BLOOD A-LINE  Final   Special Requests   Final    BOTTLES DRAWN AEROBIC AND ANAEROBIC Blood Culture results may not be optimal due to an excessive volume of blood received in culture bottles   Culture   Final    NO GROWTH 5 DAYS Performed at [facility], [address] [place], [place] [address]    Report Status 06/07/2019 FINAL  Final  MRSA PCR Screening     Status: None   Collection Time: 06/03/19  5:45 PM   Specimen: Nasopharyngeal  Result Value Ref Range Status   MRSA by PCR NEGATIVE NEGATIVE Final    Comment:        The GeneXpert MRSA Assay  (FDA approved for NASAL specimens only), is one component of a comprehensive MRSA colonization surveillance program. It is not intended to diagnose MRSA infection nor to guide or monitor treatment for MRSA infections. Performed at [facility], [address] [place], [place] [address]   Urine Culture     Status: None   Collection Time: 06/03/19  6:20 PM   Specimen: Urine, Catheterized  Result Value Ref Range Status   Specimen Description   Final    URINE, CATHETERIZED Performed at [facility], [address], [place], [place] [address]    Special Requests   Final    Immunocompromised Performed at [facility], [address] [place], [place] [address]    Culture   Final    NO GROWTH Performed at [facility], [place] [address], [place], [place] [address]    Report Status 06/05/2019 FINAL  Final  Cath Tip Culture     Status: None   Collection Time: 06/08/2019  4:32 PM   Specimen: Catheter Tip; Other  Result Value Ref Range Status   Specimen Description   Final    CATH TIP Performed at [facility], [address], [place], [place] [address]    Special Requests   Final    NONE Performed at [facility], [address], [place], [place] [address]    Culture   Final    NO GROWTH 2 DAYS Performed at [facility], [place] [address] [place], [place] [address]    Report Status 06/07/2019 FINAL  Final  Culture, respiratory (non-expectorated)     Status: None   Collection Time: 06/06/19 10:22 AM   Specimen: Tracheal Aspirate; Respiratory  Result Value Ref Range Status   Specimen Description   Final    TRACHEAL ASPIRATE Performed at [facility], [address] [place], [place] [address]    Special Requests   Final    NONE Performed at [facility], [address] [place], [place] [address]    Gram Stain   Final    RARE WBC PRESENT, PREDOMINANTLY PMN NO ORGANISMS SEEN Performed at [facility], [place] [address], [place], [place] [address]    Culture   Final    RARE CANDIDA ALBICANS RARE FUNGUS (MOLD) ISOLATED, PROBABLE CONTAMINANT/COLONIZER (SAPROPHYTE). CONTACT  MICROBIOLOGY IF FURTHER IDENTIFICATION REQUIRED [phone].    Report Status 06/09/2019 FINAL  Final  CULTURE, BLOOD (ROUTINE X 2) w Reflex to ID Panel     Status: None   Collection Time: 06/11/19  1:09 PM   Specimen: BLOOD  Result Value Ref Range Status   Specimen Description BLOOD A-LINE DRAW  Final   Special Requests   Final    BOTTLES DRAWN AEROBIC AND ANAEROBIC Blood Culture adequate volume   Culture   Final    NO GROWTH 5 DAYS Performed at [facility], [address] [place], [place] [address]    Report Status 06/16/2019 FINAL  Final  CULTURE, BLOOD (ROUTINE X 2) w Reflex to ID Panel     Status: None   Collection Time: 06/11/19  1:10 PM   Specimen: BLOOD  Result Value Ref Range Status   Specimen Description BLOOD A-LINE DRAW  Final   Special Requests   Final    BOTTLES DRAWN AEROBIC AND ANAEROBIC Blood Culture adequate volume   Culture   Final    NO GROWTH 5 DAYS Performed at [facility], [address] [place] [address], [place], [place] [address]    Report Status 06/16/2019 FINAL  Final  Culture, respiratory (non-expectorated)     Status: None   Collection Time: 06/11/19  6:04 PM   Specimen: Tracheal Aspirate; Respiratory  Result Value Ref Range Status   Specimen Description   Final    TRACHEAL ASPIRATE Performed at [facility], [address] [place], [place] [address]    Special Requests   Final    NONE Performed at [facility], [address] [place], [place] [address]    Gram Stain   Final    MODERATE WBC PRESENT, PREDOMINANTLY PMN FEW YEAST Performed at [facility], [place] [address] [place], [place] [address]    Culture FEW CANDIDA ALBICANS  Final   Report Status 06/14/2019 FINAL  Final    Coagulation Studies: No results for input(s):  LABPROT, INR in the last 72 hours.  Imaging: DG Chest Port 1 View  Result Date: 06/15/2019 CLINICAL DATA:  Pulmonary disease. EXAM: PORTABLE CHEST 1 VIEW COMPARISON:  June 13, 2019. FINDINGS: Stable cardiomediastinal silhouette. Endotracheal and nasogastric tubes are unchanged in position. No pneumothorax is noted. Hypoinflation of the lungs is noted. Mild left basilar atelectasis or infiltrate is noted with probable small pleural effusion. Minimal right basilar subsegmental atelectasis is noted. Bony thorax is unremarkable. IMPRESSION: Stable support apparatus. Mild left basilar atelectasis or infiltrate is noted with probable small pleural effusion. Minimal right basilar subsegmental atelectasis is noted. Electronically Signed   By: Marijo Conception M.D.   On: 06/15/2019 14:48    Medications:  I have reviewed the patient's current medications. Scheduled: .  stroke: mapping our early stages of recovery book   Does not apply Once  . aspirin  324 mg Per Tube Daily  . atorvastatin  80 mg Per Tube q morning - 10a  . B-complex with vitamin C  1 tablet Per Tube Daily  . calcitRIOL  0.25 mcg Per Tube Daily  . chlorhexidine gluconate (MEDLINE KIT)  15 mL Mouth Rinse BID  . Chlorhexidine Gluconate Cloth  6 each Topical Q0600  . clopidogrel  75 mg Per Tube Daily  . epoetin (EPOGEN/PROCRIT) injection  10,000 Units Intravenous Q M,W,F-HD  . feeding supplement (PRO-STAT SUGAR FREE 64)  60 mL Per Tube TID  . feeding supplement (VITAL HIGH PROTEIN)  1,000 mL  Per Tube Q24H  . insulin aspart  0-9 Units Subcutaneous Q4H  . ipratropium-albuterol  3 mL Nebulization TID  . mouth rinse  15 mL Mouth Rinse 10 times per day  . pantoprazole sodium  40 mg Per Tube Daily  . senna-docusate  1 tablet Per Tube BID  . sevelamer carbonate  0.8 g Oral TID WC  . sodium chloride flush  10-40 mL Intracatheter Q12H    Assessment/Plan: 49 y.o.malewith a history of DM, HTN and ESRDadmitted afteran episode of syncope  and seizure like activity. Patient with multiple metabolic abnormalities/infection at presentation that may have provoked seizure like activity but MRI imaging performed as well and on review shows multiple acute, small infarcts bilaterally. MRA unremarkable. Embolic etiology likely.EEG was only significant for slowing. Patient further declined from a respiratory standpoint requiring intubation and has had difficulty weaning. Neurological status has declined as well. Unclear if this is related to continued multiple medical issues or if he has had further ischemic events. Patient on ASA and Plavixbut has had some time off antiplatelet therapy due to bleeding. MRI of the brain repeated on 12/31 and shows increase in acute infarcts in the same distribution as previous imaging of 12/19.Repeat EEG only significant for slowing.  Questionably following some commands today.  Family making decisions about long term management.   Recommendations: 1. Will continue to follow with you   LOS: 15 days   [name], MD Neurology [phone] 06/17/2019  9:23 AM

## 2019-06-17 NOTE — [place] Notes (Signed)
Jake Gomez  MRN: [id number]  DOB/AGE: 09-04-1970 49 y.o.  Primary Care Physician:Jones, Iven Finn, MD  Admit date: 05/18/2019  Chief Complaint:  Chief Complaint  Patient presents with  . "Not Feeling Right"    S-Pt presented on  05/25/2019 with  Chief Complaint  Patient presents with  . "Not Feeling Right"  . Patient is intubated, sedated, unable to offers any complaints.    Meds .  stroke: mapping our early stages of recovery book   Does not apply Once  . aspirin  324 mg Per Tube Daily  . atorvastatin  80 mg Per Tube q morning - 10a  . B-complex with vitamin C  1 tablet Per Tube Daily  . calcitRIOL  0.25 mcg Per Tube Daily  . chlorhexidine gluconate (MEDLINE KIT)  15 mL Mouth Rinse BID  . Chlorhexidine Gluconate Cloth  6 each Topical Q0600  . clopidogrel  75 mg Per Tube Daily  . epoetin (EPOGEN/PROCRIT) injection  10,000 Units Intravenous Q M,W,F-HD  . feeding supplement (PRO-STAT SUGAR FREE 64)  60 mL Per Tube TID  . feeding supplement (VITAL HIGH PROTEIN)  1,000 mL Per Tube Q24H  . insulin aspart  0-9 Units Subcutaneous Q4H  . ipratropium-albuterol  3 mL Nebulization TID  . mouth rinse  15 mL Mouth Rinse 10 times per day  . pantoprazole sodium  40 mg Per Tube Daily  . senna-docusate  1 tablet Per Tube BID  . sevelamer carbonate  0.8 g Oral TID WC  . sodium chloride flush  10-40 mL Intracatheter Q12H         ROS: Unable to get any data as patient is intubated    Physical Exam: Vital signs in last 24 hours: Temp:  [98.6 F (37 C)-99.1 F (37.3 C)] 99.1 F (37.3 C) (01/03 0800) Pulse Rate:  [66-79] 69 (01/03 0800) Resp:  [15-17] 15 (01/03 0800) BP: (96-175)/(38-63) 112/47 (01/03 0800) SpO2:  [98 %-100 %] 100 % (01/03 0800) FiO2 (%):  [28 %] 28 % (01/03 0330) Weight change:  Last BM Date: 06/16/19  Intake/Output from previous day: 01/02 0701 - 01/03 0700 In: 3978.4 [I.V.:1339.5; WU/JW:1191; IV Piggyback:199.8] Out: 0  Total I/O In: 91.7  [I.V.:50.8; NG/GT:41] Out: -    Physical Exam: General- pt is intubated, sedated Resp- ET tube in situ, Rhonchi present CVS- S1S2 regular in rate and rhythm GIT- BS+, soft, NT, ND EXT- 1+ LE Edema, Cyanosis Access-temporary catheter, left femoral   and AVF Patient has left AV fistula which is not mature enough for use yet  Lab Results: CBC Recent Labs    06/16/19 0347 06/17/19 0359  WBC 8.4 9.6  HGB 7.3* 7.4*  HCT 24.1* 24.8*  PLT 327 355    BMET Recent Labs    06/16/19 0347 06/17/19 0359  NA 135 138  K 4.5 5.0  CL 98 98  CO2 25 24  GLUCOSE 208* 190*  BUN 93* 114*  CREATININE 7.34* 8.85*  CALCIUM 7.4* 7.6*    MICRO Recent Results (from the past 240 hour(s))  CULTURE, BLOOD (ROUTINE X 2) w Reflex to ID Panel     Status: None   Collection Time: 06/11/19  1:09 PM   Specimen: BLOOD  Result Value Ref Range Status   Specimen Description BLOOD A-LINE DRAW  Final   Special Requests   Final    BOTTLES DRAWN AEROBIC AND ANAEROBIC Blood Culture adequate volume   Culture   Final    NO GROWTH 5 DAYS Performed  at [facility], [address] [place], [place] [address]    Report Status 06/16/2019 FINAL  Final  CULTURE, BLOOD (ROUTINE X 2) w Reflex to ID Panel     Status: None   Collection Time: 06/11/19  1:10 PM   Specimen: BLOOD  Result Value Ref Range Status   Specimen Description BLOOD A-LINE DRAW  Final   Special Requests   Final    BOTTLES DRAWN AEROBIC AND ANAEROBIC Blood Culture adequate volume   Culture   Final    NO GROWTH 5 DAYS Performed at [facility], [address], [place], [place] [address]    Report Status 06/16/2019 FINAL  Final  Culture, respiratory (non-expectorated)     Status: None   Collection Time: 06/11/19  6:04 PM   Specimen: Tracheal Aspirate; Respiratory  Result Value Ref Range Status   Specimen Description   Final    TRACHEAL ASPIRATE Performed at [facility], [address] [place], [place]  [address]    Special Requests   Final    NONE Performed at [facility], [address] [place], [place] [address]    Gram Stain   Final    MODERATE WBC PRESENT, PREDOMINANTLY PMN FEW YEAST Performed at [facility], [place] [address] [place],  [address]    Culture FEW CANDIDA ALBICANS  Final   Report Status 06/14/2019 FINAL  Final      Lab Results  Component Value Date   PTH 19 07/19/2018   CALCIUM 7.6 (L) 06/17/2019   PHOS 7.9 (H) 06/10/2019               Impression: 1)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  During the current admission patient required CRRT from December 21 to the December 24 secondary to hypotension Patient is currently on hemodialysis through temporary sis catheter Patient was last dialyzed  on Friday We will dialyze in the morning    2) hypotension Pt required pressor support Now BP stable  3)Anemia of chronic disease  HGb is not  goal (9--11) Patient is on Epogen during dialysis   4) secondary hyperparathyroidism CKD Mineral-Bone Disorder  Secondary Hyperparathyroidism  present  Phosphorus is not at goal. Patient is on binders  5) hypokalemia Now better   6)Acid base Co2 at goal  7)  acute respiratory failure Patient is currently admitted with the acute respiratory failure requiring intubation and mechanical ventilation Patient will possibly need tracheostomy Pulmonary following   8) altered mental status Most likely multifactorial CVA and  metabolic encephalopathy. Patient had repeat MRI brain done on 12/31 which showed increasing acute  acute ischemic nonhemorrhagic infarcts Diagnosed on MRI done on December 19    Plan:   No acute need for dialysis today Will most likely  dialyze on Monday.    Alizzon Dioguardi s Theador Hawthorne 06/17/2019, 9:08 AM

## 2019-06-18 DIAGNOSIS — J9602 Acute respiratory failure with hypercapnia: Secondary | ICD-10-CM

## 2019-06-18 LAB — BASIC METABOLIC PANEL
Anion gap: 16 — ABNORMAL HIGH (ref 5–15)
BUN: 149 mg/dL — ABNORMAL HIGH (ref 6–20)
CO2: 22 mmol/L (ref 22–32)
Calcium: 7.7 mg/dL — ABNORMAL LOW (ref 8.9–10.3)
Chloride: 98 mmol/L (ref 98–111)
Creatinine, Ser: 10.03 mg/dL — ABNORMAL HIGH (ref 0.61–1.24)
GFR calc Af Amer: 6 mL/min — ABNORMAL LOW (ref >60)
GFR calc non Af Amer: 6 mL/min — ABNORMAL LOW (ref >60)
Glucose, Bld: 201 mg/dL — ABNORMAL HIGH (ref 70–99)
Potassium: 5.5 mmol/L — ABNORMAL HIGH (ref 3.5–5.1)
Sodium: 136 mmol/L (ref 135–145)

## 2019-06-18 LAB — GLUCOSE, CAPILLARY
Glucose-Capillary: 159 mg/dL — ABNORMAL HIGH (ref 70–99)
Glucose-Capillary: 169 mg/dL — ABNORMAL HIGH (ref 70–99)
Glucose-Capillary: 185 mg/dL — ABNORMAL HIGH (ref 70–99)
Glucose-Capillary: 197 mg/dL — ABNORMAL HIGH (ref 70–99)
Glucose-Capillary: 206 mg/dL — ABNORMAL HIGH (ref 70–99)
Glucose-Capillary: 212 mg/dL — ABNORMAL HIGH (ref 70–99)

## 2019-06-18 LAB — CBC WITH DIFFERENTIAL/PLATELET
Abs Immature Granulocytes: 0.04 10*3/uL (ref 0.00–0.07)
Basophils Absolute: 0 10*3/uL (ref 0.0–0.1)
Basophils Relative: 1 %
Eosinophils Absolute: 0.4 10*3/uL (ref 0.0–0.5)
Eosinophils Relative: 5 %
HCT: 22.7 % — ABNORMAL LOW (ref 39.0–52.0)
Hemoglobin: 7.4 g/dL — ABNORMAL LOW (ref 13.0–17.0)
Immature Granulocytes: 1 %
Lymphocytes Relative: 14 %
Lymphs Abs: 1.1 10*3/uL (ref 0.7–4.0)
MCH: 31.1 pg (ref 26.0–34.0)
MCHC: 32.6 g/dL (ref 30.0–36.0)
MCV: 95.4 fL (ref 80.0–100.0)
Monocytes Absolute: 0.7 10*3/uL (ref 0.1–1.0)
Monocytes Relative: 9 %
Neutro Abs: 5.5 10*3/uL (ref 1.7–7.7)
Neutrophils Relative %: 70 %
Platelets: 317 10*3/uL (ref 150–400)
RBC: 2.38 MIL/uL — ABNORMAL LOW (ref 4.22–5.81)
RDW: 14.6 % (ref 11.5–15.5)
WBC: 7.8 10*3/uL (ref 4.0–10.5)
nRBC: 0 % (ref 0.0–0.2)

## 2019-06-18 MED ORDER — CHOLESTYRAMINE 4 G PO PACK
4.0000 g | PACK | Freq: Two times a day (BID) | ORAL | Status: DC
  Administered 2019-06-18 – 2019-06-25 (×14): 4 g via ORAL
  Filled 2019-06-18 (×15): qty 1

## 2019-06-18 NOTE — [place] Notes (Signed)
Peri care completed. Rectal tube irrigated with no noted leaking from rectum.  Rectal tube draining brown liquid stool.

## 2019-06-18 NOTE — [place] Notes (Signed)
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 and multifocal CVA Unable to wean due to severe brain damage and injury from strokes and inability to protect airway   Vent Mode: PCV FiO2 (%):  [28 %] 28 % Set Rate:  [16 bmp] 16 bmp PEEP:  [5 cmH20] 5 cmH20 Plateau Pressure:  [17 cmH20-19 cmH20] 18 cmH20   BP 121/61 (BP Location: Right Arm)   Pulse 70   Temp 98.2 F (36.8 C) (Oral)   Resp 16   Ht 5' 7.99" (1.727 m)   Wt 122.7 kg   SpO2 100%   BMI 41.14 kg/m    I/O last 3 completed shifts: In: 3585.2 [I.V.:1977.5; NG/GT:1401; IV Piggyback:206.7] Out: 100 [Stool:100] Total I/O In: 125.9 [I.V.:125.9] Out: 3000 [Other:3000]  SpO2: 100 % O2 Flow Rate (L/min): 40 L/min FiO2 (%): 28 %   SIGNIFICANT EVENTS 12/21 Intubated, resp failure, acute CVA Perm cath removed, vasc cath LEFT groin, Triple lumen RT groin placed 12/22 on CRRT, septic shock, severe hypoxia, on vent, unable to wean due to hemodynamic instability 12/23 attemt neuro assessment today, follow up NEURO recs 12/24 remains on vent, try HD today, wean off CRRT 12/25 extubated for cuff leak and re-intubated 12/26 failed weaning trials due to encephalopathy from CVA and pneumonia 12/27-1/3 failure to wean, severe brain damage, severe resp failure 1/04 remains unable to wean, neurogenic breathing pattern noted   REVIEW OF SYSTEMS  PATIENT IS UNABLE TO PROVIDE COMPLETE REVIEW OF SYSTEM S DUE TO SEVERE CRITICAL ILLNESS AND ENCEPHALOPATHY   PHYSICAL EXAMINATION:  GENERAL:critically ill appearing, neurogenic breathing pattern noted  HEAD: Normocephalic, atraumatic.  EYES: Pupils equal,  round, reactive to light.  No scleral icterus.  MOUTH: Orotracheally intubated, OG in place. NECK: Supple. No thyromegaly. No nodules. No JVD.  Trachea midline PULMONARY: Coarse breath sounds, no other adventitious sounds. CARDIOVASCULAR: S1 and S2. Regular rate and rhythm. No murmurs, rubs, or gallops.  GASTROINTESTINAL: Soft, non-distended. Positive bowel sounds.  MUSCULOSKELETAL: No joint swelling, no clubbing or edema.  NEUROLOGIC: Unresponsive, opens eyes but does not track, neurogenic breathing pattern noted on Precedex SKIN:intact,warm,dry   MEDICATIONS: I have reviewed all medications and confirmed regimen as documented   CULTURE RESULTS   Recent Results (from the past 240 hour(s))  CULTURE, BLOOD (ROUTINE X 2) w Reflex to ID Panel     Status: None   Collection Time: 06/11/19  1:09 PM   Specimen: BLOOD  Result Value Ref Range Status   Specimen Description BLOOD A-LINE DRAW  Final   Special Requests   Final    BOTTLES DRAWN AEROBIC AND ANAEROBIC Blood Culture adequate volume   Culture   Final    NO GROWTH 5 DAYS Performed at [facility], [address] [place],  [address]    Report Status 06/16/2019 FINAL  Final  CULTURE, BLOOD (ROUTINE X 2) w Reflex to ID Panel     Status: None   Collection Time: 06/11/19  1:10 PM   Specimen: BLOOD  Result Value Ref Range Status   Specimen Description BLOOD A-LINE DRAW  Final   Special Requests   Final    BOTTLES DRAWN AEROBIC AND ANAEROBIC Blood  Culture adequate volume   Culture   Final    NO GROWTH 5 DAYS Performed at [facility], [address] [place], [place] [address]    Report Status 06/16/2019 FINAL  Final  Culture, respiratory (non-expectorated)     Status: None   Collection Time: 06/11/19  6:04 PM   Specimen: Tracheal Aspirate; Respiratory  Result Value Ref Range Status   Specimen Description   Final    TRACHEAL ASPIRATE Performed at [facility], [address],  [place], [place] [address]    Special Requests   Final    NONE Performed at [facility], [address] [place], [place] [address]    Gram Stain   Final    MODERATE WBC PRESENT, PREDOMINANTLY PMN FEW YEAST Performed at [facility], [place] [address] [place], [place] [address]    Culture FEW CANDIDA ALBICANS  Final   Report Status 06/14/2019 FINAL  Final            Indwelling Urinary Catheter continued, requirement due to   Reason to continue Indwelling Urinary Catheter strict Intake/Output monitoring for hemodynamic instability   Central Line/ continued, requirement due to  Reason to continue [place] of central venous pressure or other hemodynamic parameters and poor IV access   Ventilator continued, requirement due to severe respiratory failure   Ventilator Sedation RASS 0 to -2        ASSESSMENT AND PLAN SYNOPSIS  Severe ACUTE Hypoxic and Hypercapnic Respiratory Failurefrom pneumonia and acute aspirtaion pneumonia with inability to protect airway due to CVA, septic shockwith end-stage renal disease on dialysis with morbid obesity Post Covid pneumonitis and pneumonia with hypercoagulable state with acute CVA   Severe ACUTE Hypoxic and Hypercapnic Respiratory Failure -continue Mechanical Ventilator support -continue Bronchodilator Therapy -Wean Fio2 and PEEP as tolerated -VAP/VENT bundle implementation Will need trach for survival if not transition to comfort care Long-term prognosis poor  ent FORT  KIDNEY INJURY/Renal Failure ESRD -follow chem 7 -follow UO -continue Foley Catheter-assess need -Avoid nephrotoxic agents HD AS NEEDED    NEUROLOGY ACUTE MULTIFOCAL CVA WITH SEVERE BRAIN DAMAGE UNABLE TO SURVIVE WITHOUT TRACH - intubated and sedated - minimal sedation to achieve a RASS goal: -1    INFECTIOUS DISEASE POST COVID 19 INFECTION PNEUMONITIS  WITH ASPIRATION PNEUMONIA AND LLL PNEUMONIA   GI GI PROPHYLAXIS as  indicated  NUTRITIONAL STATUS DIET-->TF's as tolerated Constipation protocol as indicated   NEUROLOGY - intubated and sedated - minimal sedation to achieve a RASS goal: -1 Neurogenic breathing pattern, has been on Precedex and fentanyl   ELECTROLYTES -follow labs as needed -replace as needed -pharmacy consultation and following    Critical Care Time devoted to patient care services described in this note is 35 minutes.   Overall, patient is critically ill, prognosis is guarded.  Patient with multiorgan failure and at high risk for cardiac arrest and death.   SEVERE BRAIN DAMAGE FROM CVA'S POST COVID PNEUMONITIS WITH HYPERCOAGULABLE STATE WITH SEVERE CVA'S VERY POOR PROGNOSIS  PALLIATIVE CARE DISCUSSION WITH NEURO AND FAMILY.  Family has been out of the country, have not been able to reach.   Renold Don, MD [facility]   This note was dictated using voice recognition software/Dragon.  Despite best efforts to proofread, errors can occur which can change the meaning.  Any change was purely unintentional.

## 2019-06-18 NOTE — [place] Notes (Signed)
Central line removed from right groin per policy and procedure blue tip intact after placement of two right arm peripheral IV's.  Patient tolerated well.

## 2019-06-18 NOTE — Progress Notes (Signed)
[facility]  ROUNDING NOTE   Subjective:  Patient still intubated, sedated. His eyes are open and he has an upward gaze. Due for hemodialysis today.  Objective:  Vital signs in last 24 hours:  Temp:  [98.3 F (36.8 C)-98.6 F (37 C)] 98.6 F (37 C) (01/04 0800) Pulse Rate:  [63-94] 64 (01/04 0800) Resp:  [13-22] 13 (01/04 0900) BP: (100-167)/(49-116) 123/58 (01/04 0900) SpO2:  [98 %-100 %] 100 % (01/04 1133) FiO2 (%):  [28 %] 28 % (01/04 1133)  Weight change:  Filed Weights   06/05/19 0448 06/06/19 0424 06/07/19 0248  Weight: 121.9 kg 123.7 kg 122.7 kg    Intake/Output: I/O last 3 completed shifts: In: 2650 [I.V.:1803.4; NG/GT:640; IV Piggyback:206.7] Out: 100 [Stool:100]   Intake/Output this shift:  Total I/O In: 1302 [I.V.:342; NG/GT:960] Out: -   Physical Exam: General: Critically ill   HEENT: ETT, upward gaze  Lungs:  Vent assisted, coarse breath sounds at bases  Heart: Regular, 3/6 systolic murmur  Abdomen:  Soft, nontender, obese  Extremities: trace peripheral edema.  Neurologic: Intubated and sedated, eyes open and has upward gaze to the left  Skin: No lesions  Access: Left femoral temp HD catheter 12/21 Dr. Lucky Cowboy Left Arm AVF Dr. Lucky Cowboy 6/38/93    Basic Metabolic Panel: Recent Labs  Lab 06/13/19 0453 06/14/19 0454 06/15/19 1454 06/16/19 0347 06/17/19 0359 06/18/19 0455  NA 140 141 137 135 138 136  K 4.2 3.9 3.2* 4.5 5.0 5.5*  CL 96* 98 97* 98 98 98  CO2 '25 27 29 25 24 22  '$ GLUCOSE 232* 179* 149* 208* 190* 201*  BUN 109* 87* 61* 93* 114* 149*  CREATININE 8.94* 7.07* 4.94* 7.34* 8.85* 10.03*  CALCIUM 7.3* 7.4* 8.0* 7.4* 7.6* 7.7*  MG 2.6*  --   --   --   --   --     Liver Function Tests: No results for input(s): AST, ALT, ALKPHOS, BILITOT, PROT, ALBUMIN in the last 168 hours. No results for input(s): LIPASE, AMYLASE in the last 168 hours. No results for input(s): AMMONIA in the last 168 hours.  CBC: Recent Labs  Lab  06/12/19 0513 06/14/19 0454 06/15/19 1454 06/16/19 0347 06/17/19 0359 06/18/19 0455  WBC 9.2 8.9 10.0 8.4 9.6 7.8  NEUTROABS 6.0  --  7.9* 6.3 7.1 5.5  HGB 8.8* 7.1* 8.0* 7.3* 7.4* 7.4*  HCT 28.6* 24.4* 25.8* 24.1* 24.8* 22.7*  MCV 104.4* 105.2* 100.0 101.7* 100.8* 95.4  PLT 303 308 336 327 355 317    Cardiac Enzymes: No results for input(s): CKTOTAL, CKMB, CKMBINDEX, TROPONINI in the last 168 hours.  BNP: Invalid input(s): POCBNP  CBG: Recent Labs  Lab 06/17/19 1536 06/17/19 1944 06/17/19 2333 06/18/19 0350 06/18/19 0749  GLUCAP 179* 192* 216* 212* 159*    Microbiology: Results for orders placed or performed during the hospital encounter of 05/17/2019  Respiratory Panel by RT PCR (Flu A&B, Covid) - Nasopharyngeal Swab     Status: None   Collection Time: 06/14/2019  2:35 AM   Specimen: Nasopharyngeal Swab  Result Value Ref Range Status   SARS Coronavirus 2 by RT PCR NEGATIVE NEGATIVE Final    Comment: (NOTE) SARS-CoV-2 target nucleic acids are NOT DETECTED. The SARS-CoV-2 RNA is generally detectable in upper respiratoy specimens during the acute phase of infection. The lowest concentration of SARS-CoV-2 viral copies this assay can detect is 131 copies/mL. A negative result does not preclude SARS-Cov-2 infection and should not be used as the sole  basis for treatment or other patient management decisions. A negative result may occur with  improper specimen collection/handling, submission of specimen other than nasopharyngeal swab, presence of viral mutation(s) within the areas targeted by this assay, and inadequate number of viral copies (<131 copies/mL). A negative result must be combined with clinical observations, patient history, and epidemiological information. The expected result is Negative. Fact Sheet for Patients:  PinkCheek.be Fact Sheet for Healthcare Providers:  GravelBags.it This test is not yet  ap proved or cleared by the [place] FDA and  has been authorized for detection and/or diagnosis of SARS-CoV-2 by FDA under an Emergency Use Authorization (EUA). This EUA will remain  in effect (meaning this test can be used) for the duration of the COVID-19 declaration under Section 564(b)(1) of the Act, 21 U.S.C. section 360bbb-3(b)(1), unless the authorization is terminated or revoked sooner.    Influenza A by PCR NEGATIVE NEGATIVE Final   Influenza B by PCR NEGATIVE NEGATIVE Final    Comment: (NOTE) The Xpert Xpress SARS-CoV-2/FLU/RSV assay is intended as an aid in  the diagnosis of influenza from Nasopharyngeal swab specimens and  should not be used as a sole basis for treatment. Nasal washings and  aspirates are unacceptable for Xpert Xpress SARS-CoV-2/FLU/RSV  testing. Fact Sheet for Patients: PinkCheek.be Fact Sheet for Healthcare Providers: GravelBags.it This test is not yet approved or cleared by the [place] FDA and  has been authorized for detection and/or diagnosis of SARS-CoV-2 by  FDA under an Emergency Use Authorization (EUA). This EUA will remain  in effect (meaning this test can be used) for the duration of the  Covid-19 declaration under Section 564(b)(1) of the Act, 21  U.S.C. section 360bbb-3(b)(1), unless the authorization is  terminated or revoked. Performed at [facility], [address] [place], [place] [address]   Culture, blood (routine x 2)     Status: None   Collection Time: 05/23/2019  2:36 AM   Specimen: BLOOD  Result Value Ref Range Status   Specimen Description BLOOD LEFT ANTECUBITAL  Final   Special Requests   Final    BOTTLES DRAWN AEROBIC AND ANAEROBIC Blood Culture adequate volume   Culture   Final    NO GROWTH 5 DAYS Performed at [facility], [address] [place], [place] [address]    Report Status 06/07/2019 FINAL  Final  Culture, blood (routine x  2)     Status: None   Collection Time: 06/05/2019  2:36 AM   Specimen: BLOOD  Result Value Ref Range Status   Specimen Description BLOOD RIGHT FOREARM  Final   Special Requests   Final    BOTTLES DRAWN AEROBIC AND ANAEROBIC Blood Culture adequate volume   Culture   Final    NO GROWTH 5 DAYS Performed at [facility], [address] [place], [place] [address]    Report Status 06/07/2019 FINAL  Final  Culture, blood (routine x 2)     Status: None   Collection Time: 05/29/2019  3:10 PM   Specimen: BLOOD  Result Value Ref Range Status   Specimen Description BLOOD A-LINE  Final   Special Requests   Final    BOTTLES DRAWN AEROBIC AND ANAEROBIC Blood Culture results may not be optimal due to an excessive volume of blood received in culture bottles   Culture   Final    NO GROWTH 5 DAYS Performed at [facility], [address], [place],  [address]    Report Status  06/07/2019 FINAL  Final  Culture, blood (routine x 2)     Status: None   Collection Time: 05/20/2019  3:10 PM   Specimen: BLOOD  Result Value Ref Range Status   Specimen Description BLOOD A-LINE  Final   Special Requests   Final    BOTTLES DRAWN AEROBIC AND ANAEROBIC Blood Culture results may not be optimal due to an excessive volume of blood received in culture bottles   Culture   Final    NO GROWTH 5 DAYS Performed at [facility], [address] [place], [place] [address]    Report Status 06/07/2019 FINAL  Final  MRSA PCR Screening     Status: None   Collection Time: 06/03/19  5:45 PM   Specimen: Nasopharyngeal  Result Value Ref Range Status   MRSA by PCR NEGATIVE NEGATIVE Final    Comment:        The GeneXpert MRSA Assay (FDA approved for NASAL specimens only), is one component of a comprehensive MRSA colonization surveillance program. It is not intended to diagnose MRSA infection nor to guide or monitor treatment for MRSA infections. Performed at [facility], [address], [place], [place] [address]   Urine Culture     Status: None   Collection Time: 06/03/19  6:20 PM   Specimen: Urine, Catheterized  Result Value Ref Range Status   Specimen Description   Final    URINE, CATHETERIZED Performed at [facility], [address] [place], [place] [address]    Special Requests   Final    Immunocompromised Performed at [facility], [address], [place], [place] [address]    Culture   Final    NO GROWTH Performed at [facility], [place] [address] [place], [place] [address]    Report Status 06/05/2019 FINAL  Final  Cath Tip Culture     Status: None   Collection Time: 05/19/2019  4:32 PM   Specimen: Catheter Tip; Other  Result Value Ref Range Status   Specimen Description   Final    CATH TIP Performed at [facility], [address], [place], [place] [address]    Special Requests   Final    NONE Performed at [facility], [address] [place], [place] [address]    Culture   Final    NO GROWTH 2 DAYS Performed at [facility], [place] [address] [place], [place] [address]    Report Status 06/07/2019 FINAL  Final  Culture, respiratory (non-expectorated)     Status: None   Collection Time: 06/06/19 10:22 AM   Specimen: Tracheal Aspirate; Respiratory  Result Value Ref Range Status   Specimen Description   Final    TRACHEAL ASPIRATE Performed at [facility], [address] [place], [place] [address]    Special Requests   Final    NONE Performed at [facility], [address] [place], [place] [address]    Gram Stain   Final    RARE WBC PRESENT, PREDOMINANTLY PMN NO ORGANISMS SEEN Performed at [facility], [place] [address], [place], [place] [address]    Culture   Final    RARE CANDIDA ALBICANS RARE FUNGUS (MOLD) ISOLATED, PROBABLE CONTAMINANT/COLONIZER (SAPROPHYTE). CONTACT MICROBIOLOGY IF FURTHER IDENTIFICATION REQUIRED [phone].    Report Status  06/09/2019 FINAL  Final  CULTURE, BLOOD (ROUTINE X 2) w Reflex to ID Panel     Status: None   Collection Time: 06/11/19  1:09 PM   Specimen: BLOOD  Result Value Ref Range Status   Specimen Description BLOOD A-LINE DRAW  Final   Special Requests   Final    BOTTLES DRAWN AEROBIC AND ANAEROBIC Blood Culture adequate volume   Culture   Final    NO GROWTH 5 DAYS Performed at [facility], [address] [place], [place] [address]    Report Status 06/16/2019 FINAL  Final  CULTURE, BLOOD (ROUTINE X 2) w Reflex to ID Panel     Status: None   Collection Time: 06/11/19  1:10 PM   Specimen: BLOOD  Result Value Ref Range Status   Specimen Description BLOOD A-LINE DRAW  Final   Special Requests   Final    BOTTLES DRAWN AEROBIC AND ANAEROBIC Blood Culture adequate volume   Culture   Final    NO GROWTH 5 DAYS Performed at [facility], [address], [place], [place] [address]    Report Status 06/16/2019 FINAL  Final  Culture, respiratory (non-expectorated)     Status: None   Collection Time: 06/11/19  6:04 PM   Specimen: Tracheal Aspirate; Respiratory  Result Value Ref Range Status   Specimen Description   Final    TRACHEAL ASPIRATE Performed at [facility], [address] [place], [place] [address]    Special Requests   Final    NONE Performed at [facility], [address] [place], [place] [address]    Gram Stain   Final    MODERATE WBC PRESENT, PREDOMINANTLY PMN FEW YEAST Performed at [facility], [place] [address] [place], [place] [address]    Culture FEW CANDIDA ALBICANS  Final   Report Status 06/14/2019 FINAL  Final    Coagulation Studies: No results for input(s): LABPROT, INR in the last 72 hours.  Urinalysis: No results for input(s): COLORURINE, LABSPEC, PHURINE, GLUCOSEU, HGBUR, BILIRUBINUR, KETONESUR, PROTEINUR, UROBILINOGEN, NITRITE, LEUKOCYTESUR in the last 72 hours.  Invalid input(s): APPERANCEUR    Imaging: No  results found.   Medications:   . sodium chloride Stopped (06/18/19 0336)  . dexmedetomidine (PRECEDEX) IV infusion 1 mcg/kg/hr (06/18/19 1000)  . fentaNYL infusion INTRAVENOUS 300 mcg/hr (06/18/19 1000)  . levETIRAcetam 500 mg (06/18/19 0337)  . norepinephrine (LEVOPHED) Adult infusion Stopped (06/16/19 2312)   .  stroke: mapping our early stages of recovery book   Does not apply Once  . aspirin  324 mg Per Tube Daily  . atorvastatin  80 mg Per Tube q morning - 10a  . B-complex with vitamin C  1 tablet Per Tube Daily  . calcitRIOL  0.25 mcg Per Tube Daily  . chlorhexidine gluconate (MEDLINE KIT)  15 mL Mouth Rinse BID  . Chlorhexidine Gluconate Cloth  6 each Topical Q0600  . clopidogrel  75 mg Per Tube Daily  . epoetin (EPOGEN/PROCRIT) injection  10,000 Units Intravenous Q M,W,F-HD  . feeding supplement (PRO-STAT SUGAR FREE 64)  60 mL Per Tube TID  . feeding supplement (VITAL HIGH PROTEIN)  1,000 mL Per Tube Q24H  . insulin aspart  0-9 Units Subcutaneous Q4H  . ipratropium-albuterol  3 mL Nebulization TID  . mouth rinse  15 mL Mouth Rinse 10 times per day  . pantoprazole sodium  40 mg Per Tube Daily  . senna-docusate  1 tablet Per Tube BID  . sevelamer carbonate  0.8 g Oral TID WC  . sodium chloride flush  10-40 mL Intracatheter Q12H   acetaminophen **OR** acetaminophen, fentaNYL, heparin, ipratropium-albuterol, labetalol,  midazolam, ondansetron **OR** ondansetron (ZOFRAN) IV, sodium chloride flush, vecuronium  Assessment/ Plan:  Jake Gomez is a 49 y.o. Hispanic male with end stage renal disease, diabetes mellitus type I, hypertension, sleep apnea who was admitted to [facility] on 05/17/2019 for Hyperkalemia [E87.5] CVA (cerebral vascular accident) ([place]) [I63.9] Stroke ([place]) [I63.9] ESRD (end stage renal disease) on dialysis ([place]) [N18.6, Z99.2] Chest pain [R07.9]  [facility] [address] RIJ 120kg  1. End stage renal disease on hemodialysis. Requiring CRRT from 12/21 to  12/24 Patient due for hemodialysis today.  Orders have been prepared.  Still using femoral dialysis catheter as his AV fistula is not yet fully mature.  2. Acute Resp failure - Pneumonia: with acute respiratory failure requiring intubation and mechanical ventilation.    -Patient maintained on ventilatory support.  Tracheostomy being considered.  3. Anemia of chronic kidney disease: macrocytic.  Lab Results  Component Value Date   HGB 7.4 (L) 06/18/2019   -Patient on Epogen 10,000 units IV with dialysis.  4. Secondary Hyperparathyroidism with hypocalcemia and hyperphosphatemia.  Off calcium acetate Lab Results  Component Value Date   CALCIUM 7.7 (L) 06/18/2019   PHOS 7.9 (H) 06/10/2019  Patient maintained on Renvela powder for now.  5. Acute ischemic stroke Repeat MRI done Remains encephalopathic Neurologist following    LOS: [address] 1/4/202112:54 PM

## 2019-06-18 NOTE — Progress Notes (Signed)
Hd started  

## 2019-06-18 NOTE — Progress Notes (Signed)
This note also relates to the following rows which could not be included: Pulse Rate - Cannot attach notes to unvalidated device data Resp - Cannot attach notes to unvalidated device data  Hd completed  

## 2019-06-19 LAB — CBC WITH DIFFERENTIAL/PLATELET
Abs Immature Granulocytes: 0.03 10*3/uL (ref 0.00–0.07)
Basophils Absolute: 0.1 10*3/uL (ref 0.0–0.1)
Basophils Relative: 1 %
Eosinophils Absolute: 0.3 10*3/uL (ref 0.0–0.5)
Eosinophils Relative: 5 %
HCT: 27.8 % — ABNORMAL LOW (ref 39.0–52.0)
Hemoglobin: 8.6 g/dL — ABNORMAL LOW (ref 13.0–17.0)
Immature Granulocytes: 1 %
Lymphocytes Relative: 16 %
Lymphs Abs: 1 10*3/uL (ref 0.7–4.0)
MCH: 30.8 pg (ref 26.0–34.0)
MCHC: 30.9 g/dL (ref 30.0–36.0)
MCV: 99.6 fL (ref 80.0–100.0)
Monocytes Absolute: 0.6 10*3/uL (ref 0.1–1.0)
Monocytes Relative: 10 %
Neutro Abs: 4.1 10*3/uL (ref 1.7–7.7)
Neutrophils Relative %: 67 %
Platelets: 301 10*3/uL (ref 150–400)
RBC: 2.79 MIL/uL — ABNORMAL LOW (ref 4.22–5.81)
RDW: 14.5 % (ref 11.5–15.5)
WBC: 6 10*3/uL (ref 4.0–10.5)
nRBC: 0 % (ref 0.0–0.2)

## 2019-06-19 LAB — BASIC METABOLIC PANEL
Anion gap: 16 — ABNORMAL HIGH (ref 5–15)
BUN: 108 mg/dL — ABNORMAL HIGH (ref 6–20)
CO2: 26 mmol/L (ref 22–32)
Calcium: 7.5 mg/dL — ABNORMAL LOW (ref 8.9–10.3)
Chloride: 94 mmol/L — ABNORMAL LOW (ref 98–111)
Creatinine, Ser: 7.32 mg/dL — ABNORMAL HIGH (ref 0.61–1.24)
GFR calc Af Amer: 9 mL/min — ABNORMAL LOW (ref >60)
GFR calc non Af Amer: 8 mL/min — ABNORMAL LOW (ref >60)
Glucose, Bld: 208 mg/dL — ABNORMAL HIGH (ref 70–99)
Potassium: 4.5 mmol/L (ref 3.5–5.1)
Sodium: 136 mmol/L (ref 135–145)

## 2019-06-19 LAB — GLUCOSE, CAPILLARY
Glucose-Capillary: 143 mg/dL — ABNORMAL HIGH (ref 70–99)
Glucose-Capillary: 143 mg/dL — ABNORMAL HIGH (ref 70–99)
Glucose-Capillary: 176 mg/dL — ABNORMAL HIGH (ref 70–99)
Glucose-Capillary: 180 mg/dL — ABNORMAL HIGH (ref 70–99)
Glucose-Capillary: 190 mg/dL — ABNORMAL HIGH (ref 70–99)
Glucose-Capillary: 213 mg/dL — ABNORMAL HIGH (ref 70–99)

## 2019-06-19 LAB — TRIGLYCERIDES: Triglycerides: 137 mg/dL (ref <150)

## 2019-06-19 MED ORDER — PROPOFOL 1000 MG/100ML IV EMUL
5.0000 ug/kg/min | INTRAVENOUS | Status: DC
  Administered 2019-06-19: 10 ug/kg/min via INTRAVENOUS
  Administered 2019-06-19: 20 ug/kg/min via INTRAVENOUS
  Administered 2019-06-19: 30 ug/kg/min via INTRAVENOUS
  Administered 2019-06-20: 25 ug/kg/min via INTRAVENOUS
  Administered 2019-06-20: 24.993 ug/kg/min via INTRAVENOUS
  Administered 2019-06-20 – 2019-06-21 (×5): 25 ug/kg/min via INTRAVENOUS
  Administered 2019-06-21 (×2): 30 ug/kg/min via INTRAVENOUS
  Administered 2019-06-22: 25 ug/kg/min via INTRAVENOUS
  Administered 2019-06-22: 10 ug/kg/min via INTRAVENOUS
  Administered 2019-06-22: 25 ug/kg/min via INTRAVENOUS
  Administered 2019-06-23 (×3): 30 ug/kg/min via INTRAVENOUS
  Administered 2019-06-23: 05:00:00 15 ug/kg/min via INTRAVENOUS
  Administered 2019-06-24: 19.967 ug/kg/min via INTRAVENOUS
  Administered 2019-06-24 (×2): 20 ug/kg/min via INTRAVENOUS
  Administered 2019-06-24: 25 ug/kg/min via INTRAVENOUS
  Administered 2019-06-24 – 2019-06-25 (×3): 20 ug/kg/min via INTRAVENOUS
  Filled 2019-06-19 (×25): qty 100

## 2019-06-19 NOTE — Progress Notes (Signed)
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 Unable to wean due to severe brain damage and injury from strokes and inability to protect airway   Vent Mode: PCV FiO2 (%):  [28 %-40 %] 28 % Set Rate:  [16 bmp] 16 bmp PEEP:  [5 cmH20] 5 cmH20 Plateau Pressure:  [7 cmH20-19 cmH20] 7 cmH20   BP (!) 126/42   Pulse 80   Temp 98.5 F (36.9 C) (Oral)   Resp 13   Ht 5' 7.99" (1.727 m)   Wt 122.7 kg   SpO2 100%   BMI 41.14 kg/m    I/O last 3 completed shifts: In: 4516.4 [I.V.:2156; NG/GT:2160; IV Piggyback:200.3] Out: 3000 [Other:3000] Total I/O In: 500.1 [I.V.:231.4; NG/GT:168.7; IV Piggyback:100] Out: -   SpO2: 100 % O2 Flow Rate (L/min): 40 L/min FiO2 (%): 28 %   SIGNIFICANT EVENTS 12/21 Intubated, resp failure, acute CVA Perm cath removed, vasc cath LEFT groin, Triple lumen RT groin placed 12/22 on CRRT, septic shock, severe hypoxia, on vent, unable to wean due to hemodynamic instability 12/23 attemt neuro assessment today, follow up NEURO recs 12/24 remains on vent, try HD today, wean off CRRT 12/25 extubated for cuff leak and re-intubated 12/26 failed weaning trials due to encephalopathy from CVA and pneumonia 12/27-1/3 failure to wean, severe brain damage, severe resp failure 01/04 - remains obtunded, neurogenic breathing pattern off sedatives 01/05- D/Cd Precedex, started Propofol   REVIEW OF SYSTEMS  PATIENT IS UNABLE TO PROVIDE COMPLETE REVIEW OF SYSTEM S DUE TO SEVERE CRITICAL ILLNESS AND ENCEPHALOPATHY   PHYSICAL EXAMINATION: GENERAL:critically ill appearing, neurogenic breathing pattern noted  HEAD: Normocephalic, atraumatic.  EYES: Pupils equal, round,  reactive to light.  No scleral icterus.  MOUTH: Orotracheally intubated, OG in place. NECK: Supple. No thyromegaly. No nodules. No JVD.  Trachea midline PULMONARY: Coarse breath sounds, no other adventitious sounds. CARDIOVASCULAR: S1 and S2. Regular rate and rhythm. No murmurs, rubs, or gallops.  GASTROINTESTINAL: Soft, non-distended. Positive bowel sounds.  MUSCULOSKELETAL: No joint swelling, no clubbing or edema.  NEUROLOGIC: Unresponsive, opens eyes but does not track, neurogenic breathing pattern noted on Precedex SKIN:intact,warm,dry   MEDICATIONS: I have reviewed all medications and confirmed regimen as documented   CULTURE RESULTS   Recent Results (from the past 240 hour(s))  CULTURE, BLOOD (ROUTINE X 2) w Reflex to ID Panel     Status: None   Collection Time: 06/11/19  1:09 PM   Specimen: BLOOD  Result Value Ref Range Status   Specimen Description BLOOD A-LINE DRAW  Final   Special Requests   Final    BOTTLES DRAWN AEROBIC AND ANAEROBIC Blood Culture adequate volume   Culture   Final    NO GROWTH 5 DAYS Performed at [facility], [address] [place], [place] [address]    Report Status 06/16/2019 FINAL  Final  CULTURE, BLOOD (ROUTINE X 2) w Reflex to ID Panel     Status: None   Collection Time: 06/11/19  1:10 PM   Specimen: BLOOD  Result Value Ref Range Status   Specimen Description BLOOD A-LINE DRAW  Final   Special Requests   Final    BOTTLES DRAWN AEROBIC AND ANAEROBIC Blood Culture adequate  volume   Culture   Final    NO GROWTH 5 DAYS Performed at [facility], [address] [place], [place] [address]    Report Status 06/16/2019 FINAL  Final  Culture, respiratory (non-expectorated)     Status: None   Collection Time: 06/11/19  6:04 PM   Specimen: Tracheal Aspirate; Respiratory  Result Value Ref Range Status   Specimen Description   Final    TRACHEAL ASPIRATE Performed at [facility], [address], [place], [place]  [address]    Special Requests   Final    NONE Performed at [facility], [address] [place], [place] [address]    Gram Stain   Final    MODERATE WBC PRESENT, PREDOMINANTLY PMN FEW YEAST Performed at [facility], [place] [address], [place], [place] [address]    Culture FEW CANDIDA ALBICANS  Final   Report Status 06/14/2019 FINAL  Final            Indwelling Urinary Catheter continued, requirement due to   Reason to continue Indwelling Urinary Catheter strict Intake/Output monitoring for hemodynamic instability   Central Line/ continued, requirement due to  Reason to continue [place] of central venous pressure or other hemodynamic parameters and poor IV access   Ventilator continued, requirement due to severe respiratory failure   Ventilator Sedation RASS 0 to -2        ASSESSMENT AND PLAN SYNOPSIS  Severe ACUTE Hypoxic and Hypercapnic Respiratory Failurefrom pneumonia and acute aspirtaion pneumonia with inability to protect airway due to CVA, septic shockwith end-stage renal disease on dialysis with morbid obesity Post Covid pneumonitis and pneumonia with hypercoagulable state with acute CVA   Severe ACUTE Hypoxic and Hypercapnic Respiratory Failure -continue Mechanical Ventilator support -continue Bronchodilator Therapy -Wean Fio2 and PEEP as tolerated -VAP/VENT bundle implementation Will need trach for survival if not transition to comfort care Long-term prognosis poor Placed on propofol for ventilator discomfort  ent FORT  KIDNEY INJURY/Renal Failure ESRD -follow chem 7 -follow UO -continue Foley Catheter-assess need -Avoid nephrotoxic agents HD AS NEEDED    NEUROLOGY ACUTE MULTIFOCAL CVA WITH SEVERE BRAIN DAMAGE UNABLE TO SURVIVE WITHOUT TRACH - intubated and sedated - minimal sedation to achieve a RASS goal: -1    INFECTIOUS DISEASE POST COVID 19 INFECTION PNEUMONITIS  WITH ASPIRATION PNEUMONIA AND  LLL PNEUMONIA   GI GI PROPHYLAXIS as indicated  NUTRITIONAL STATUS DIET-->TF's as tolerated Constipation protocol as indicated   NEUROLOGY - intubated and sedated - minimal sedation to achieve a RASS goal: -1 Neurogenic breathing pattern, has been on Precedex and fentanyl, transition to propofol for ventilator associated discomfort   ELECTROLYTES -follow labs as needed -replace as needed -pharmacy consultation and following    Critical Care Time devoted to patient care services described in this note is 40 minutes.   Overall, patient is critically ill, prognosis is guarded.  Patient with multiorgan failure and at high risk for cardiac arrest and death.   SEVERE BRAIN DAMAGE FROM CVA'S POST COVID PNEUMONITIS WITH HYPERCOAGULABLE STATE WITH SEVERE CVA'S VERY POOR PROGNOSIS  PALLIATIVE CARE DISCUSSION WITH NEURO AND FAMILY.  Family has been out of the country, have not been able to reach.   Renold Don, MD [place] [facility]   This note was dictated using voice recognition software/Dragon.  Despite best efforts to proofread, errors can occur which can change the meaning.  Any change was purely unintentional.

## 2019-06-19 NOTE — Progress Notes (Signed)
PALLIATIVE NOTE:  Referral received for goals of care discussion. Patient remains intubated and sedated. No family at the bedside. I attempted to call daughter, Martice Hellums to have further discussions. Voicemail left. I also attempted mother's documented number. I will await for family to return call or attempt to call at later time.   Updates received from RN and chart reviewed. It is possible per RN report that daughter may have traveled to [place] to discuss/gather other family members with plans to make further decision on Thursday. I will continue to make connections with family.   Detailed noted and recommendations to follow once our team has been able to speak with family.   Alda Lea, AGPCNP-BC Palliative Medicine Team   NO CHARGE

## 2019-06-19 NOTE — Progress Notes (Signed)
Nutrition Follow-up  DOCUMENTATION CODES:   Morbid obesity  INTERVENTION:  Continue Vital High Protein at 40 mL/hr (960 mL goal daily volume) + Pro-Stat 60 mL TID per tube. Provides 1560 kcal, 174 grams of protein, 806 mL H2O daily.  Continue B-complex with C daily per tube.  Provide minimum free water flush of 20-30 mL Q4hrs to maintain tube patency.  NUTRITION DIAGNOSIS:   Inadequate oral intake related to inability to eat as evidenced by NPO status.  Ongoing - addressing with TF regimen.  GOAL:   Provide needs based on ASPEN/SCCM guidelines  Met with TF regimen.  MONITOR:   Vent status, Labs, Weight trends, TF tolerance, I & O's  REASON FOR ASSESSMENT:   Ventilator    ASSESSMENT:   49 year old male with PMHx of HTN, DM, sleep apnea, ESRD on HD, previous COVID-19 infection admitted with acute CVA, acute respiratory failure requiring intubation on 12/21.  Patient is currently intubated on ventilator support MV: 6 L/min Temp (24hrs), Avg:98.6 F (37 C), Min:98.1 F (36.7 C), Max:99 F (37.2 C)  Propofol: N/A  Medications reviewed and include: B-complex with C daily, cholestyramine 4 grams BID, Epogen 10000 units during HD, Novolog 0-9 units Q4hrs, pantoprazole, Renvela 0.8 grams TID, Precedex gtt, fentanyl gtt, Keppra.  Labs reviewed: CBG 176-213, Chloride 94, BUN 108, Creatinine 7.32.  Enteral Access: OGT  Discussed on rounds. Family is deciding on goals of care.  Diet Order:   Diet Order    None     EDUCATION NEEDS:   No education needs have been identified at this time  Skin:  Skin Assessment: Reviewed RN Assessment  Last BM:  06/18/2019  Height:   Ht Readings from Last 1 Encounters:  06/14/19 5' 7.99" (1.727 m)   Weight:   Wt Readings from Last 1 Encounters:  06/07/19 122.7 kg   Ideal Body Weight:  70 kg  BMI:  Body mass index is 41.14 kg/m.  Estimated Nutritional Needs:   Kcal:  1320-1680 (11-14 kcal/kg)  Protein:  175 grams  (2.5 grams/kg IBW)  Fluid:  UOP + 1 L  Jacklynn Barnacle, MS, RD, LDN Office: [phone] Pager: [phone] After Hours/Weekend Pager: [phone]

## 2019-06-19 NOTE — Progress Notes (Signed)
[facility]  ROUNDING NOTE   Subjective:  Patient remains critically ill.  Eyes open this AM. Still with upward gaze. Also still on sedation.   Objective:  Vital signs in last 24 hours:  Temp:  [98.1 F (36.7 C)-99 F (37.2 C)] 98.8 F (37.1 C) (01/05 0742) Pulse Rate:  [57-83] 59 (01/05 0742) Resp:  [14-18] 14 (01/05 0742) BP: (96-174)/(57-80) 123/57 (01/05 0600) SpO2:  [99 %-100 %] 100 % (01/05 0742) FiO2 (%):  [28 %] 28 % (01/05 0755)  Weight change:  Filed Weights   06/05/19 0448 06/06/19 0424 06/07/19 0248  Weight: 121.9 kg 123.7 kg 122.7 kg    Intake/Output: I/O last 3 completed shifts: In: 3911.7 [I.V.:2378.3; NG/GT:1280; IV Piggyback:253.4] Out: 3100 [Other:3000; Stool:100]   Intake/Output this shift:  No intake/output data recorded.  Physical Exam: General: Critically ill   HEENT: ETT, upward gaze  Lungs:  Vent assisted, coarse breath sounds at bases  Heart: Regular, 3/6 systolic murmur  Abdomen:  Soft, nontender, obese  Extremities: trace peripheral edema.  Neurologic: Intubated and sedated, eyes open and has upward gaze to the left  Skin: No lesions  Access: Left femoral temp HD catheter 12/21 Dr. Lucky Cowboy Left Arm AVF Dr. Lucky Cowboy 7/62/83    Basic Metabolic Panel: Recent Labs  Lab 06/13/19 0453 06/15/19 1454 06/16/19 0347 06/17/19 0359 06/18/19 0455 06/19/19 0538  NA 140 137 135 138 136 136  K 4.2 3.2* 4.5 5.0 5.5* 4.5  CL 96* 97* 98 98 98 94*  CO2 _0 GLUCOSE 232* 149* 208* 190* 201* 208*  BUN 109* 61* 93* 114* 149* 108*  CREATININE 8.94* 4.94* 7.34* 8.85* 10.03* 7.32*  CALCIUM 7.3* 8.0* 7.4* 7.6* 7.7* 7.5*  MG 2.6*  --   --   --   --   --     Liver Function Tests: No results for input(s): AST, ALT, ALKPHOS, BILITOT, PROT, ALBUMIN in the last 168 hours. No results for input(s): LIPASE, AMYLASE in the last 168 hours. No results for input(s): AMMONIA in the last 168 hours.  CBC: Recent Labs  Lab 06/15/19 1454  06/16/19 0347 06/17/19 0359 06/18/19 0455 06/19/19 0538  WBC 10.0 8.4 9.6 7.8 6.0  NEUTROABS 7.9* 6.3 7.1 5.5 4.1  HGB 8.0* 7.3* 7.4* 7.4* 8.6*  HCT 25.8* 24.1* 24.8* 22.7* 27.8*  MCV 100.0 101.7* 100.8* 95.4 99.6  PLT 336 327 355 317 301    Cardiac Enzymes: No results for input(s): CKTOTAL, CKMB, CKMBINDEX, TROPONINI in the last 168 hours.  BNP: Invalid input(s): POCBNP  CBG: Recent Labs  Lab 06/18/19 1546 06/18/19 2007 06/18/19 2336 06/19/19 0408 06/19/19 0730  GLUCAP 197* 169* 206* 213* 180*    Microbiology: Results for orders placed or performed during the hospital encounter of 05/20/2019  Respiratory Panel by RT PCR (Flu A&B, Covid) - Nasopharyngeal Swab     Status: None   Collection Time: 05/26/2019  2:35 AM   Specimen: Nasopharyngeal Swab  Result Value Ref Range Status   SARS Coronavirus 2 by RT PCR NEGATIVE NEGATIVE Final    Comment: (NOTE) SARS-CoV-2 target nucleic acids are NOT DETECTED. The SARS-CoV-2 RNA is generally detectable in upper respiratoy specimens during the acute phase of infection. The lowest concentration of SARS-CoV-2 viral copies this assay can detect is 131 copies/mL. A negative result does not preclude SARS-Cov-2 infection and should not be used as the sole basis for treatment or other patient management decisions. A negative result may occur with  improper specimen collection/handling, submission of specimen other than nasopharyngeal swab, presence of viral mutation(s) within the areas targeted by this assay, and inadequate number of viral copies (<131 copies/mL). A negative result must be combined with clinical observations, patient history, and epidemiological information. The expected result is Negative. Fact Sheet for Patients:  PinkCheek.be Fact Sheet for Healthcare Providers:  GravelBags.it This test is not yet ap proved or cleared by the [place] FDA and  has been  authorized for detection and/or diagnosis of SARS-CoV-2 by FDA under an Emergency Use Authorization (EUA). This EUA will remain  in effect (meaning this test can be used) for the duration of the COVID-19 declaration under Section 564(b)(1) of the Act, 21 U.S.C. section 360bbb-3(b)(1), unless the authorization is terminated or revoked sooner.    Influenza A by PCR NEGATIVE NEGATIVE Final   Influenza B by PCR NEGATIVE NEGATIVE Final    Comment: (NOTE) The Xpert Xpress SARS-CoV-2/FLU/RSV assay is intended as an aid in  the diagnosis of influenza from Nasopharyngeal swab specimens and  should not be used as a sole basis for treatment. Nasal washings and  aspirates are unacceptable for Xpert Xpress SARS-CoV-2/FLU/RSV  testing. Fact Sheet for Patients: PinkCheek.be Fact Sheet for Healthcare Providers: GravelBags.it This test is not yet approved or cleared by the [place] FDA and  has been authorized for detection and/or diagnosis of SARS-CoV-2 by  FDA under an Emergency Use Authorization (EUA). This EUA will remain  in effect (meaning this test can be used) for the duration of the  Covid-19 declaration under Section 564(b)(1) of the Act, 21  U.S.C. section 360bbb-3(b)(1), unless the authorization is  terminated or revoked. Performed at [facility], [address] [place], [place] [address]   Culture, blood (routine x 2)     Status: None   Collection Time: 05/21/2019  2:36 AM   Specimen: BLOOD  Result Value Ref Range Status   Specimen Description BLOOD LEFT ANTECUBITAL  Final   Special Requests   Final    BOTTLES DRAWN AEROBIC AND ANAEROBIC Blood Culture adequate volume   Culture   Final    NO GROWTH 5 DAYS Performed at [facility], [address], [place], [place] [address]    Report Status 06/07/2019 FINAL  Final  Culture, blood (routine x 2)     Status: None   Collection Time: 06/14/2019  2:36 AM    Specimen: BLOOD  Result Value Ref Range Status   Specimen Description BLOOD RIGHT FOREARM  Final   Special Requests   Final    BOTTLES DRAWN AEROBIC AND ANAEROBIC Blood Culture adequate volume   Culture   Final    NO GROWTH 5 DAYS Performed at [facility], [address] [place], [place] [address]    Report Status 06/07/2019 FINAL  Final  Culture, blood (routine x 2)     Status: None   Collection Time: 05/15/2019  3:10 PM   Specimen: BLOOD  Result Value Ref Range Status   Specimen Description BLOOD A-LINE  Final   Special Requests   Final    BOTTLES DRAWN AEROBIC AND ANAEROBIC Blood Culture results may not be optimal due to an excessive volume of blood received in culture bottles   Culture   Final    NO GROWTH 5 DAYS Performed at [facility], [address] [place], [place] [address]    Report Status 06/07/2019 FINAL  Final  Culture, blood (routine x 2)     Status:  None   Collection Time: 06/14/2019  3:10 PM   Specimen: BLOOD  Result Value Ref Range Status   Specimen Description BLOOD A-LINE  Final   Special Requests   Final    BOTTLES DRAWN AEROBIC AND ANAEROBIC Blood Culture results may not be optimal due to an excessive volume of blood received in culture bottles   Culture   Final    NO GROWTH 5 DAYS Performed at [facility], [address] [place], [place] [address]    Report Status 06/07/2019 FINAL  Final  MRSA PCR Screening     Status: None   Collection Time: 06/03/19  5:45 PM   Specimen: Nasopharyngeal  Result Value Ref Range Status   MRSA by PCR NEGATIVE NEGATIVE Final    Comment:        The GeneXpert MRSA Assay (FDA approved for NASAL specimens only), is one component of a comprehensive MRSA colonization surveillance program. It is not intended to diagnose MRSA infection nor to guide or monitor treatment for MRSA infections. Performed at [facility], [address] [place], [place] [address]   Urine Culture      Status: None   Collection Time: 06/03/19  6:20 PM   Specimen: Urine, Catheterized  Result Value Ref Range Status   Specimen Description   Final    URINE, CATHETERIZED Performed at [facility], [address] [place], [place] [address]    Special Requests   Final    Immunocompromised Performed at [facility], [address], [place], [place] [address]    Culture   Final    NO GROWTH Performed at [facility], [place] [address], [place], [place] [address]    Report Status 06/05/2019 FINAL  Final  Cath Tip Culture     Status: None   Collection Time: 05/21/2019  4:32 PM   Specimen: Catheter Tip; Other  Result Value Ref Range Status   Specimen Description   Final    CATH TIP Performed at [facility], [address], [place], [place] [address]    Special Requests   Final    NONE Performed at [facility], [address] [place], [place] [address]    Culture   Final    NO GROWTH 2 DAYS Performed at [facility], [place] [address], [place], [place] [address]    Report Status 06/07/2019 FINAL  Final  Culture, respiratory (non-expectorated)     Status: None   Collection Time: 06/06/19 10:22 AM   Specimen: Tracheal Aspirate; Respiratory  Result Value Ref Range Status   Specimen Description   Final    TRACHEAL ASPIRATE Performed at [facility], [address] [place], [place] [address]    Special Requests   Final    NONE Performed at [facility], [address] [place], [place] [address]    Gram Stain   Final    RARE WBC PRESENT, PREDOMINANTLY PMN NO ORGANISMS SEEN Performed at [facility], [place] [address] [place] [address] [place], [place] [address]    Culture   Final    RARE CANDIDA ALBICANS RARE FUNGUS (MOLD) ISOLATED, PROBABLE CONTAMINANT/COLONIZER (SAPROPHYTE). CONTACT MICROBIOLOGY IF FURTHER IDENTIFICATION REQUIRED [phone].    Report Status 06/09/2019 FINAL  Final  CULTURE, BLOOD (ROUTINE X 2) w  Reflex to ID Panel     Status: None   Collection Time: 06/11/19  1:09 PM   Specimen: BLOOD  Result Value Ref Range Status   Specimen  Description BLOOD A-LINE DRAW  Final   Special Requests   Final    BOTTLES DRAWN AEROBIC AND ANAEROBIC Blood Culture adequate volume   Culture   Final    NO GROWTH 5 DAYS Performed at [facility], [address] [place], [place] [address]    Report Status 06/16/2019 FINAL  Final  CULTURE, BLOOD (ROUTINE X 2) w Reflex to ID Panel     Status: None   Collection Time: 06/11/19  1:10 PM   Specimen: BLOOD  Result Value Ref Range Status   Specimen Description BLOOD A-LINE DRAW  Final   Special Requests   Final    BOTTLES DRAWN AEROBIC AND ANAEROBIC Blood Culture adequate volume   Culture   Final    NO GROWTH 5 DAYS Performed at [facility], [address], [place], [place] [address]    Report Status 06/16/2019 FINAL  Final  Culture, respiratory (non-expectorated)     Status: None   Collection Time: 06/11/19  6:04 PM   Specimen: Tracheal Aspirate; Respiratory  Result Value Ref Range Status   Specimen Description   Final    TRACHEAL ASPIRATE Performed at [facility], [address] [place], [place] [address]    Special Requests   Final    NONE Performed at [facility], [address] [place], [place] [address]    Gram Stain   Final    MODERATE WBC PRESENT, PREDOMINANTLY PMN FEW YEAST Performed at [facility], [place] [address] [place], [place] [address]    Culture FEW CANDIDA ALBICANS  Final   Report Status 06/14/2019 FINAL  Final    Coagulation Studies: No results for input(s): LABPROT, INR in the last 72 hours.  Urinalysis: No results for input(s): COLORURINE, LABSPEC, PHURINE, GLUCOSEU, HGBUR, BILIRUBINUR, KETONESUR, PROTEINUR, UROBILINOGEN, NITRITE, LEUKOCYTESUR in the last 72 hours.  Invalid input(s): APPERANCEUR    Imaging: No results found.   Medications:   . sodium chloride  Stopped (06/19/19 0252)  . dexmedetomidine (PRECEDEX) IV infusion 1 mcg/kg/hr (06/19/19 1032)  . fentaNYL infusion INTRAVENOUS 150 mcg/hr (06/19/19 1032)  . levETIRAcetam 400 mL/hr at 06/19/19 0300  . norepinephrine (LEVOPHED) Adult infusion Stopped (06/16/19 2312)   .  stroke: mapping our early stages of recovery book   Does not apply Once  . aspirin  324 mg Per Tube Daily  . atorvastatin  80 mg Per Tube q morning - 10a  . B-complex with vitamin C  1 tablet Per Tube Daily  . calcitRIOL  0.25 mcg Per Tube Daily  . chlorhexidine gluconate (MEDLINE KIT)  15 mL Mouth Rinse BID  . Chlorhexidine Gluconate Cloth  6 each Topical Q0600  . cholestyramine  4 g Oral BID  . clopidogrel  75 mg Per Tube Daily  . epoetin (EPOGEN/PROCRIT) injection  10,000 Units Intravenous Q M,W,F-HD  . feeding supplement (PRO-STAT SUGAR FREE 64)  60 mL Per Tube TID  . feeding supplement (VITAL HIGH PROTEIN)  1,000 mL Per Tube Q24H  . insulin aspart  0-9 Units Subcutaneous Q4H  . ipratropium-albuterol  3 mL Nebulization TID  . mouth rinse  15 mL Mouth Rinse 10 times per day  . pantoprazole sodium  40 mg Per Tube Daily  . sevelamer carbonate  0.8 g Oral TID WC  . sodium chloride flush  10-40 mL Intracatheter Q12H   acetaminophen **OR** acetaminophen, fentaNYL, heparin, ipratropium-albuterol, labetalol, midazolam, ondansetron **OR** ondansetron (ZOFRAN) IV, sodium chloride flush, vecuronium  Assessment/ Plan:  Mr.  Jake Gomez is a 49 y.o. Hispanic male with end stage renal disease, diabetes mellitus type I, hypertension, sleep apnea who was admitted to [facility] on 05/17/2019 for Hyperkalemia [E87.5] CVA (cerebral vascular accident) ([place]) [I63.9] Stroke ([place]) [I63.9] ESRD (end stage renal disease) on dialysis ([place]) [N18.6, Z99.2] Chest pain [R07.9]  [facility] [address] [address] RIJ 120kg  1. End stage renal disease on hemodialysis. Requiring CRRT from 12/21 to 12/24 -  Pt had HD yesterday, no urgent indication for HD  today.  Will plan for HD again tomorrow.   2. Acute Resp failure - Pneumonia: with acute respiratory failure requiring intubation and mechanical ventilation.    -Pt continued on vent, tracheostomy being planned.   3. Anemia of chronic kidney disease: macrocytic.  Lab Results  Component Value Date   HGB 8.6 (L) 06/19/2019   -Continue epogen with dialysis.   4. Secondary Hyperparathyroidism with hypocalcemia and hyperphosphatemia.  Off calcium acetate Lab Results  Component Value Date   CALCIUM 7.5 (L) 06/19/2019   PHOS 7.9 (H) 06/10/2019  Repeat phos tomororw.   5. Acute ischemic stroke Repeat MRI done Remains encephalopathic -  Eyes open, but still has upward gaze.      LOS: 17 Dreya Buhrman 1/5/202110:49 AM

## 2019-06-20 LAB — PHOSPHORUS: Phosphorus: 9 mg/dL — ABNORMAL HIGH (ref 2.5–4.6)

## 2019-06-20 LAB — CBC WITH DIFFERENTIAL/PLATELET
Abs Immature Granulocytes: 0.05 10*3/uL (ref 0.00–0.07)
Basophils Absolute: 0.1 10*3/uL (ref 0.0–0.1)
Basophils Relative: 1 %
Eosinophils Absolute: 0.4 10*3/uL (ref 0.0–0.5)
Eosinophils Relative: 4 %
HCT: 24.4 % — ABNORMAL LOW (ref 39.0–52.0)
Hemoglobin: 7.5 g/dL — ABNORMAL LOW (ref 13.0–17.0)
Immature Granulocytes: 1 %
Lymphocytes Relative: 16 %
Lymphs Abs: 1.4 10*3/uL (ref 0.7–4.0)
MCH: 30.6 pg (ref 26.0–34.0)
MCHC: 30.7 g/dL (ref 30.0–36.0)
MCV: 99.6 fL (ref 80.0–100.0)
Monocytes Absolute: 0.9 10*3/uL (ref 0.1–1.0)
Monocytes Relative: 10 %
Neutro Abs: 5.9 10*3/uL (ref 1.7–7.7)
Neutrophils Relative %: 68 %
Platelets: 327 10*3/uL (ref 150–400)
RBC: 2.45 MIL/uL — ABNORMAL LOW (ref 4.22–5.81)
RDW: 14.6 % (ref 11.5–15.5)
WBC: 8.7 10*3/uL (ref 4.0–10.5)
nRBC: 0 % (ref 0.0–0.2)

## 2019-06-20 LAB — GLUCOSE, CAPILLARY
Glucose-Capillary: 118 mg/dL — ABNORMAL HIGH (ref 70–99)
Glucose-Capillary: 145 mg/dL — ABNORMAL HIGH (ref 70–99)
Glucose-Capillary: 161 mg/dL — ABNORMAL HIGH (ref 70–99)
Glucose-Capillary: 168 mg/dL — ABNORMAL HIGH (ref 70–99)
Glucose-Capillary: 173 mg/dL — ABNORMAL HIGH (ref 70–99)
Glucose-Capillary: 190 mg/dL — ABNORMAL HIGH (ref 70–99)

## 2019-06-20 LAB — BASIC METABOLIC PANEL
Anion gap: 15 (ref 5–15)
BUN: 138 mg/dL — ABNORMAL HIGH (ref 6–20)
CO2: 24 mmol/L (ref 22–32)
Calcium: 7.1 mg/dL — ABNORMAL LOW (ref 8.9–10.3)
Chloride: 93 mmol/L — ABNORMAL LOW (ref 98–111)
Creatinine, Ser: 8.52 mg/dL — ABNORMAL HIGH (ref 0.61–1.24)
GFR calc Af Amer: 8 mL/min — ABNORMAL LOW (ref >60)
GFR calc non Af Amer: 7 mL/min — ABNORMAL LOW (ref >60)
Glucose, Bld: 180 mg/dL — ABNORMAL HIGH (ref 70–99)
Potassium: 4.9 mmol/L (ref 3.5–5.1)
Sodium: 132 mmol/L — ABNORMAL LOW (ref 135–145)

## 2019-06-20 LAB — MAGNESIUM: Magnesium: 2.1 mg/dL (ref 1.7–2.4)

## 2019-06-20 MED ORDER — SEVELAMER CARBONATE 2.4 G PO PACK
2.4000 g | PACK | Freq: Three times a day (TID) | ORAL | Status: DC
  Administered 2019-06-20 – 2019-06-25 (×14): 2.4 g via ORAL
  Filled 2019-06-20 (×16): qty 1

## 2019-06-20 NOTE — Progress Notes (Signed)
HD TX completed, tolerated well.    06/20/19 1900  Vital Signs  Pulse Rate 78  Pulse Rate Source Monitor  Resp 15  BP (!) 115/48  BP Location Right Arm  BP Method Automatic  Patient Position (if appropriate) Lying  Oxygen Therapy  SpO2 100 %  O2 Device Ventilator  Pulse Oximetry Type Continuous  End [place] CO2 (EtCO2) 40  During Hemodialysis Assessment  HD Safety Checks Performed Yes  KECN 61.8 KECN  Dialysis Fluid Bolus Normal Saline  Bolus Amount (mL) 250 mL  Intra-Hemodialysis Comments Tx completed;Tolerated well

## 2019-06-20 NOTE — Progress Notes (Signed)
[facility]  ROUNDING NOTE   Subjective:  Patient seen and evaluated at bedside. Still on the ventilator. Did open eyes but was not following commands today.   Objective:  Vital signs in last 24 hours:  Temp:  [98.2 F (36.8 C)-98.9 F (37.2 C)] 98.3 F (36.8 C) (01/06 0800) Pulse Rate:  [67-92] 84 (01/06 1100) Resp:  [13-20] 14 (01/06 1100) BP: (95-163)/(35-56) 123/43 (01/06 1100) SpO2:  [98 %-100 %] 100 % (01/06 1100) FiO2 (%):  [28 %] 28 % (01/06 0800)  Weight change:  Filed Weights   06/05/19 0448 06/06/19 0424 06/07/19 0248  Weight: 121.9 kg 123.7 kg 122.7 kg    Intake/Output: I/O last 3 completed shifts: In: 3828.9 [I.V.:2076.8; NG/GT:1452; IV Piggyback:300.2] Out: 3000 [Other:3000]   Intake/Output this shift:  Total I/O In: 109.5 [I.V.:109.5] Out: 0   Physical Exam: General: Critically ill   HEENT: ETT, upward gaze  Lungs:  Vent assisted, coarse breath sounds at bases  Heart: Regular, 3/6 systolic murmur  Abdomen:  Soft, nontender, obese  Extremities: trace peripheral edema.  Neurologic: Intubated and sedated, eyes open, upward gaze noted  Skin: No lesions  Access: Left femoral temp HD catheter 12/21 Dr. Lucky Cowboy Left Arm AVF Dr. Lucky Cowboy 0/94/07    Basic Metabolic Panel: Recent Labs  Lab 06/16/19 0347 06/17/19 0359 06/18/19 0455 06/19/19 0538 06/20/19 0445  NA 135 138 136 136 132*  K 4.5 5.0 5.5* 4.5 4.9  CL 98 98 98 94* 93*  CO2 '25 24 22 26 24  '$ GLUCOSE 208* 190* 201* 208* 180*  BUN 93* 114* 149* 108* 138*  CREATININE 7.34* 8.85* 10.03* 7.32* 8.52*  CALCIUM 7.4* 7.6* 7.7* 7.5* 7.1*  MG  --   --   --   --  2.1  PHOS  --   --   --   --  9.0*    Liver Function Tests: No results for input(s): AST, ALT, ALKPHOS, BILITOT, PROT, ALBUMIN in the last 168 hours. No results for input(s): LIPASE, AMYLASE in the last 168 hours. No results for input(s): AMMONIA in the last 168 hours.  CBC: Recent Labs  Lab 06/16/19 0347 06/17/19 0359  06/18/19 0455 06/19/19 0538 06/20/19 0445  WBC 8.4 9.6 7.8 6.0 8.7  NEUTROABS 6.3 7.1 5.5 4.1 5.9  HGB 7.3* 7.4* 7.4* 8.6* 7.5*  HCT 24.1* 24.8* 22.7* 27.8* 24.4*  MCV 101.7* 100.8* 95.4 99.6 99.6  PLT 327 355 317 301 327    Cardiac Enzymes: No results for input(s): CKTOTAL, CKMB, CKMBINDEX, TROPONINI in the last 168 hours.  BNP: Invalid input(s): POCBNP  CBG: Recent Labs  Lab 06/19/19 1929 06/19/19 2352 06/20/19 0408 06/20/19 0734 06/20/19 1141  GLUCAP 143* 143* 168* 161* 173*    Microbiology: Results for orders placed or performed during the hospital encounter of 05/16/2019  Respiratory Panel by RT PCR (Flu A&B, Covid) - Nasopharyngeal Swab     Status: None   Collection Time: 05/27/2019  2:35 AM   Specimen: Nasopharyngeal Swab  Result Value Ref Range Status   SARS Coronavirus 2 by RT PCR NEGATIVE NEGATIVE Final    Comment: (NOTE) SARS-CoV-2 target nucleic acids are NOT DETECTED. The SARS-CoV-2 RNA is generally detectable in upper respiratoy specimens during the acute phase of infection. The lowest concentration of SARS-CoV-2 viral copies this assay can detect is 131 copies/mL. A negative result does not preclude SARS-Cov-2 infection and should not be used as the sole basis for treatment or other patient management decisions. A negative result  may occur with  improper specimen collection/handling, submission of specimen other than nasopharyngeal swab, presence of viral mutation(s) within the areas targeted by this assay, and inadequate number of viral copies (<131 copies/mL). A negative result must be combined with clinical observations, patient history, and epidemiological information. The expected result is Negative. Fact Sheet for Patients:  PinkCheek.be Fact Sheet for Healthcare Providers:  GravelBags.it This test is not yet ap proved or cleared by the [place] FDA and  has been authorized for  detection and/or diagnosis of SARS-CoV-2 by FDA under an Emergency Use Authorization (EUA). This EUA will remain  in effect (meaning this test can be used) for the duration of the COVID-19 declaration under Section 564(b)(1) of the Act, 21 U.S.C. section 360bbb-3(b)(1), unless the authorization is terminated or revoked sooner.    Influenza A by PCR NEGATIVE NEGATIVE Final   Influenza B by PCR NEGATIVE NEGATIVE Final    Comment: (NOTE) The Xpert Xpress SARS-CoV-2/FLU/RSV assay is intended as an aid in  the diagnosis of influenza from Nasopharyngeal swab specimens and  should not be used as a sole basis for treatment. Nasal washings and  aspirates are unacceptable for Xpert Xpress SARS-CoV-2/FLU/RSV  testing. Fact Sheet for Patients: PinkCheek.be Fact Sheet for Healthcare Providers: GravelBags.it This test is not yet approved or cleared by the [place] FDA and  has been authorized for detection and/or diagnosis of SARS-CoV-2 by  FDA under an Emergency Use Authorization (EUA). This EUA will remain  in effect (meaning this test can be used) for the duration of the  Covid-19 declaration under Section 564(b)(1) of the Act, 21  U.S.C. section 360bbb-3(b)(1), unless the authorization is  terminated or revoked. Performed at [facility], [address] [place], [place] [address]   Culture, blood (routine x 2)     Status: None   Collection Time: 05/19/2019  2:36 AM   Specimen: BLOOD  Result Value Ref Range Status   Specimen Description BLOOD LEFT ANTECUBITAL  Final   Special Requests   Final    BOTTLES DRAWN AEROBIC AND ANAEROBIC Blood Culture adequate volume   Culture   Final    NO GROWTH 5 DAYS Performed at [facility], [address], [place], [place] [address]    Report Status 06/07/2019 FINAL  Final  Culture, blood (routine x 2)     Status: None   Collection Time: 06/05/2019  2:36 AM   Specimen:  BLOOD  Result Value Ref Range Status   Specimen Description BLOOD RIGHT FOREARM  Final   Special Requests   Final    BOTTLES DRAWN AEROBIC AND ANAEROBIC Blood Culture adequate volume   Culture   Final    NO GROWTH 5 DAYS Performed at [facility], [address] [place], [place] [address]    Report Status 06/07/2019 FINAL  Final  Culture, blood (routine x 2)     Status: None   Collection Time: 05/22/2019  3:10 PM   Specimen: BLOOD  Result Value Ref Range Status   Specimen Description BLOOD A-LINE  Final   Special Requests   Final    BOTTLES DRAWN AEROBIC AND ANAEROBIC Blood Culture results may not be optimal due to an excessive volume of blood received in culture bottles   Culture   Final    NO GROWTH 5 DAYS Performed at [facility], [address] [place], [place] [address]    Report Status 06/07/2019 FINAL  Final  Culture, blood (routine x 2)  Status: None   Collection Time: 05/19/2019  3:10 PM   Specimen: BLOOD  Result Value Ref Range Status   Specimen Description BLOOD A-LINE  Final   Special Requests   Final    BOTTLES DRAWN AEROBIC AND ANAEROBIC Blood Culture results may not be optimal due to an excessive volume of blood received in culture bottles   Culture   Final    NO GROWTH 5 DAYS Performed at [facility], [address] [place], [place] [address]    Report Status 06/07/2019 FINAL  Final  MRSA PCR Screening     Status: None   Collection Time: 06/03/19  5:45 PM   Specimen: Nasopharyngeal  Result Value Ref Range Status   MRSA by PCR NEGATIVE NEGATIVE Final    Comment:        The GeneXpert MRSA Assay (FDA approved for NASAL specimens only), is one component of a comprehensive MRSA colonization surveillance program. It is not intended to diagnose MRSA infection nor to guide or monitor treatment for MRSA infections. Performed at [facility], [address] [place], [place] [address]   Urine Culture     Status: None    Collection Time: 06/03/19  6:20 PM   Specimen: Urine, Catheterized  Result Value Ref Range Status   Specimen Description   Final    URINE, CATHETERIZED Performed at [facility], [address], [place], [place] [address]    Special Requests   Final    Immunocompromised Performed at [facility], [address], [place], [place] [address]    Culture   Final    NO GROWTH Performed at [facility], [place] [address] [place], [place] [address]    Report Status 06/05/2019 FINAL  Final  Cath Tip Culture     Status: None   Collection Time: 05/16/2019  4:32 PM   Specimen: Catheter Tip; Other  Result Value Ref Range Status   Specimen Description   Final    CATH TIP Performed at [facility], [address], [place], [place] [address]    Special Requests   Final    NONE Performed at [facility], [address] [place], [place] [address]    Culture   Final    NO GROWTH 2 DAYS Performed at [facility], [place] [address] [place], [place] [address]    Report Status 06/07/2019 FINAL  Final  Culture, respiratory (non-expectorated)     Status: None   Collection Time: 06/06/19 10:22 AM   Specimen: Tracheal Aspirate; Respiratory  Result Value Ref Range Status   Specimen Description   Final    TRACHEAL ASPIRATE Performed at [facility], [address] [place], [place] [address]    Special Requests   Final    NONE Performed at [facility], [address] [place],  [address]    Gram Stain   Final    RARE WBC PRESENT, PREDOMINANTLY PMN NO ORGANISMS SEEN Performed at [facility], [place] [address], [place],  [address]    Culture   Final    RARE CANDIDA ALBICANS RARE FUNGUS (MOLD) ISOLATED, PROBABLE CONTAMINANT/COLONIZER (SAPROPHYTE). CONTACT MICROBIOLOGY IF FURTHER IDENTIFICATION REQUIRED [phone].    Report Status 06/09/2019 FINAL  Final  CULTURE, BLOOD (ROUTINE X 2) w Reflex to ID Panel      Status: None   Collection Time: 06/11/19  1:09 PM   Specimen: BLOOD  Result Value Ref Range Status  Specimen Description BLOOD A-LINE DRAW  Final   Special Requests   Final    BOTTLES DRAWN AEROBIC AND ANAEROBIC Blood Culture adequate volume   Culture   Final    NO GROWTH 5 DAYS Performed at [facility], [address] [place], [place] [address]    Report Status 06/16/2019 FINAL  Final  CULTURE, BLOOD (ROUTINE X 2) w Reflex to ID Panel     Status: None   Collection Time: 06/11/19  1:10 PM   Specimen: BLOOD  Result Value Ref Range Status   Specimen Description BLOOD A-LINE DRAW  Final   Special Requests   Final    BOTTLES DRAWN AEROBIC AND ANAEROBIC Blood Culture adequate volume   Culture   Final    NO GROWTH 5 DAYS Performed at [facility], [address], [place], [place] [address]    Report Status 06/16/2019 FINAL  Final  Culture, respiratory (non-expectorated)     Status: None   Collection Time: 06/11/19  6:04 PM   Specimen: Tracheal Aspirate; Respiratory  Result Value Ref Range Status   Specimen Description   Final    TRACHEAL ASPIRATE Performed at [facility], [address], [place], [place] [address]    Special Requests   Final    NONE Performed at [facility], [address] [place], [place] [address]    Gram Stain   Final    MODERATE WBC PRESENT, PREDOMINANTLY PMN FEW YEAST Performed at [facility], [place] [address], [place], [place] [address]    Culture FEW CANDIDA ALBICANS  Final   Report Status 06/14/2019 FINAL  Final    Coagulation Studies: No results for input(s): LABPROT, INR in the last 72 hours.  Urinalysis: No results for input(s): COLORURINE, LABSPEC, PHURINE, GLUCOSEU, HGBUR, BILIRUBINUR, KETONESUR, PROTEINUR, UROBILINOGEN, NITRITE, LEUKOCYTESUR in the last 72 hours.  Invalid input(s): APPERANCEUR    Imaging: No results found.   Medications:   . sodium chloride 5 mL/hr at 06/20/19  0821  . dexmedetomidine (PRECEDEX) IV infusion Stopped (06/19/19 1244)  . fentaNYL infusion INTRAVENOUS 300 mcg/hr (06/20/19 0854)  . levETIRAcetam Stopped (06/20/19 0454)  . norepinephrine (LEVOPHED) Adult infusion Stopped (06/16/19 2312)  . propofol (DIPRIVAN) infusion 24.993 mcg/kg/min (06/20/19 0852)   .  stroke: mapping our early stages of recovery book   Does not apply Once  . aspirin  324 mg Per Tube Daily  . atorvastatin  80 mg Per Tube q morning - 10a  . B-complex with vitamin C  1 tablet Per Tube Daily  . calcitRIOL  0.25 mcg Per Tube Daily  . chlorhexidine gluconate (MEDLINE KIT)  15 mL Mouth Rinse BID  . Chlorhexidine Gluconate Cloth  6 each Topical Q0600  . cholestyramine  4 g Oral BID  . clopidogrel  75 mg Per Tube Daily  . epoetin (EPOGEN/PROCRIT) injection  10,000 Units Intravenous Q M,W,F-HD  . feeding supplement (PRO-STAT SUGAR FREE 64)  60 mL Per Tube TID  . feeding supplement (VITAL HIGH PROTEIN)  1,000 mL Per Tube Q24H  . insulin aspart  0-9 Units Subcutaneous Q4H  . ipratropium-albuterol  3 mL Nebulization TID  . mouth rinse  15 mL Mouth Rinse 10 times per day  . pantoprazole sodium  40 mg Per Tube Daily  . sevelamer carbonate  0.8 g Oral TID WC  . sodium chloride flush  10-40 mL Intracatheter Q12H   acetaminophen **OR** acetaminophen, fentaNYL, heparin, ipratropium-albuterol, labetalol, midazolam, ondansetron **OR** ondansetron (ZOFRAN) IV,  sodium chloride flush, vecuronium  Assessment/ Plan:  [name] is a 49 y.o. Hispanic male with end stage renal disease, diabetes mellitus type I, hypertension, sleep apnea who was admitted to [facility] on 05/16/2019 for Hyperkalemia [E87.5] CVA (cerebral vascular accident) ([place]) [I63.9] Stroke ([place]) [I63.9] ESRD (end stage renal disease) on dialysis ([place]) [N18.6, Z99.2] Chest pain [R07.9]  [facility] [address] RIJ 120kg  1. End stage renal disease on hemodialysis. Requiring CRRT from 12/21 to 12/24 -Patient due  for hemodialysis treatment today.  Orders have been prepared.  2. Acute Resp failure - Pneumonia: with acute respiratory failure requiring intubation and mechanical ventilation.    -Patient maintained on ventilatory support at this time.  Palliative care following.  3. Anemia of chronic kidney disease: macrocytic.  Lab Results  Component Value Date   HGB 7.5 (L) 06/20/2019   -Hemoglobin currently 7.5.  Consider transfusion for hemoglobin of 7 or less.  4. Secondary Hyperparathyroidism with hypocalcemia and hyperphosphatemia.  Off calcium acetate Lab Results  Component Value Date   CALCIUM 7.1 (L) 06/20/2019   PHOS 9.0 (H) 06/20/2019  Phosphorus remains high at 9.0.  Increase Renvela powder to 2.4 g 3 times daily.  5. Acute ischemic stroke Repeat MRI done Remains encephalopathic -  Eyes open, but still has upward gaze.      LOS: 18 Korina Tretter 1/6/202112:07 PM

## 2019-06-20 NOTE — Progress Notes (Signed)
HD Tx started w/o complication   [id number]  Hand-Off documentation  Report given to (Full Name) Beatris Ship, RN   Report received from (Full Name) Virgel Paling   Vital Signs  Temp 98.2 F (36.8 C)  Temp Source Axillary  Pulse Rate 82  Pulse Rate Source Monitor  Resp 15  BP (!) 143/53  BP Location Right Arm  BP Method Automatic  Patient Position (if appropriate) Lying  Oxygen Therapy  SpO2 100 %  O2 Device Ventilator  FiO2 (%) 28 %  Patient Activity (if Appropriate) In bed  Pulse Oximetry Type Continuous  End Tidal CO2 (EtCO2) 40  Pain Assessment  Pain Scale CPOT  Critical Care Pain Observation Tool (CPOT)  Facial Expression 0  Body Movements 0  Muscle Tension 0  Compliance with ventilator (intubated pts.) 0  Vocalization (extubated pts.) N/A  CPOT Total 0  Dialysis Weight  Weight (!) 136.2 kg  Type of Weight Pre-Dialysis  Time-Out for Hemodialysis  What Procedure? HD  Pt Identifiers(min of two) First/Last Name;MRN/Account#  Correct Site? Yes  Correct Side? Yes  Correct Procedure? Yes  Consents Verified? Yes  Rad Studies Available? N/A  Safety Precautions Reviewed? Yes  Engineer, civil (consulting) Number 5  Station Number  (Bedside ICU 16)  UF/Alarm Test Passed  Conductivity: Meter 13.6  Conductivity: Machine  13.6  pH 7.2  Reverse Osmosis WRO 3  Normal Saline Lot Number [id number]  Dialyzer Lot Number [id number]  Disposable Set Lot Number [id number]  Machine Temperature 98.6 F (37 C)  Musician and Audible Yes  Blood Lines Intact and Secured Yes  Pre Treatment Patient Checks  Vascular access used during treatment Fistula  Hepatitis B Surface Antigen Results Negative  Date Hepatitis B Surface Antigen Drawn 05/08/19  Hepatitis B Surface Antibody  (>10)  Date Hepatitis B Surface Antibody Drawn 05/08/19  Hemodialysis Consent Verified Yes  Hemodialysis Standing Orders Initiated Yes  ECG (Telemetry) Monitor On Yes  Prime Ordered Normal Saline   Length of  DialysisTreatment -hour(s) 3 Hour(s) (Verbal order change by MD )  Dialysis Treatment Comments Na 140  Dialyzer Elisio 17H NR  Dialysate 2K;2.5 Ca  Dialysis Anticoagulant None  Dialysate Flow Ordered 800  Blood Flow Rate Ordered 400 mL/min  Ultrafiltration Goal 1.5 Liters  Pre Treatment Labs Phosphorus  Dialysis Blood Pressure Support Ordered Normal Saline  During Hemodialysis Assessment  Blood Flow Rate (mL/min) 400 mL/min  Arterial Pressure (mmHg) -180 mmHg  Venous Pressure (mmHg) 200 mmHg  Transmembrane Pressure (mmHg) 60 mmHg  Ultrafiltration Rate (mL/min) 500 mL/min  Dialysate Flow Rate (mL/min) 800 ml/min  Conductivity: Machine  13.6  HD Safety Checks Performed Yes  Dialysis Fluid Bolus Normal Saline  Bolus Amount (mL) 250 mL  Intra-Hemodialysis Comments Tx initiated  Education / Care Plan  Dialysis Education Provided No (Comment)  Documented Education in Care Plan  (Pt non interactive )  Fistula / Graft Left Upper arm Arteriovenous fistula  No Placement Date or Time found.   Placed prior to admission: Yes  Orientation: Left  Access Location: Upper arm  Access Type: Arteriovenous fistula  Site Condition No complications  Fistula / Graft Assessment Present;Thrill;Bruit  Status Patent;Accessed  Needle Size 15g  Drainage Description None

## 2019-06-20 NOTE — Progress Notes (Signed)
Post HD [facility]    06/20/19 1915  Vital Signs  Temp 98.1 F (36.7 C)  Temp Source Oral  Pulse Rate 79  Pulse Rate Source Monitor  Resp 16  BP (!) 115/50  BP Location Right Arm  BP Method Automatic  Patient Position (if appropriate) Lying  Oxygen Therapy  SpO2 100 %  O2 Device Ventilator  Pulse Oximetry Type Continuous  End Tidal CO2 (EtCO2) 41  Pain Assessment  Pain Scale 0-10  Pain Score 0  Dialysis Weight  Weight 134 kg  Type of Weight Post-Dialysis  Post-Hemodialysis Assessment  Rinseback Volume (mL) 250 mL  KECN 61.8 V  Dialyzer Clearance Lightly streaked  Duration of HD Treatment -hour(s) 3 hour(s)  Hemodialysis Intake (mL) 500 mL  UF Total -Machine (mL) 2559 mL  Net UF (mL) 2059 mL  Tolerated HD Treatment Yes  AVG/AVF Arterial Site Held (minutes) 5 minutes  AVG/AVF Venous Site Held (minutes) 5 minutes  Fistula / Graft Left Upper arm Arteriovenous fistula  No Placement Date or Time found.   Placed prior to admission: Yes  Orientation: Left  Access Location: Upper arm  Access Type: Arteriovenous fistula  Site Condition No complications  Fistula / Graft Assessment Present;Thrill;Bruit  Status Deaccessed  Drainage Description None

## 2019-06-20 NOTE — Progress Notes (Signed)
Pre HD Assessment    06/20/19 1600  Neurological  Level of Consciousness Responds to Voice  Orientation Level Intubated/Tracheostomy - Unable to assess  R Pupil Size (mm) 2  R Pupil Reaction Sluggish  L Pupil Size (mm) 2  L Pupil Reaction Sluggish  Respiratory  Respiratory Pattern Regular;Unlabored;Symmetrical  Chest Assessment Chest expansion symmetrical  Bilateral Breath Sounds Diminished;Rhonchi  Cough None  Airway 8 mm  Placement Date/Time: 06/08/19 0100   Placed By: ICU physician  Airway Device: Endotracheal Tube  ETT Types: Endobronchial  Size (mm): 8 mm  Cuffed: Cuffed  Insertion attempts: 1  Placement Confirmation: CXR Confirmed  Secured at (cm): 25 cm  Secured at (cm) 25 cm  Measured From Lips  Secured Location Left  Secured By Charity fundraiser  Site Condition Dry  Cardiac  Pulse Regular  Heart Sounds S1, S2  Jugular Venous Distention (JVD) No  Cardiac Rhythm NSR  Antiarrhythmic device No  Vascular  R Radial Pulse +2  L Radial Pulse +2  R Dorsalis Pedis Pulse +2  L Dorsalis Pedis Pulse +2  Edema Generalized;Right upper extremity;Left upper extremity;Right lower extremity;Left lower extremity  Generalized Edema +2  RUE Edema Non-pitting  LUE Edema +1  RLE Edema +1  LLE Edema +1  Integumentary  Integumentary (WDL) X  Skin Color Appropriate for ethnicity  Skin Condition Dry  Skin Integrity Ecchymosis;Excoriated (scratch marks);Skin tear  Ecchymosis Location Other (Comment) (scattered)  Ecchymosis Location Orientation Other (Comment) (scattered)  Musculoskeletal  Musculoskeletal (WDL) X  Generalized Weakness Yes  Assistive Device None  Gastrointestinal  Bowel Sounds Assessment Active  Last BM Date 06/20/19  NG/OG Tube Orogastric 14 Fr. Right mouth Documented cm marking at nare/ corner of mouth 44 cm  Placement Date/Time: 05/18/2019 1155   Tube Type: Orogastric  Tube Size (Fr.): 14 Fr.  Tube Location: Right mouth  Technique Used to Measure Tube  Placement: Documented cm marking at nare/ corner of mouth  Initial cm Marking at Borders Group of Mouth (if ...  Cm Marking at Nare/Corner of Mouth (if applicable) 60 cm  Site Assessment Clean;Dry;Intact  Ongoing Placement Verification No change in cm markings or external length of tube from initial placement;No change in respiratory status;No acute changes, not attributed to clinical condition  Status Infusing tube feed  Drainage Appearance None  GU Assessment  Genitourinary (WDL) X  Genitourinary Symptoms Anuria  Psychosocial  Psychosocial (WDL) X  Patient Behaviors Not interactive  Emotional support given Given to patient

## 2019-06-20 NOTE — Progress Notes (Signed)
Post HD Assessment    06/20/19 1915  Neurological  Level of Consciousness Responds to Voice  Orientation Level Intubated/Tracheostomy - Unable to assess  Respiratory  Respiratory Pattern Regular;Accessory muscle use  Chest Assessment Chest expansion symmetrical  Bilateral Breath Sounds Clear;Diminished  Cough None  Airway 8 mm  Placement Date/Time: 06/08/19 0100   Placed By: ICU physician  Airway Device: Endotracheal Tube  ETT Types: Endobronchial  Size (mm): 8 mm  Cuffed: Cuffed  Insertion attempts: 1  Placement Confirmation: CXR Confirmed  Secured at (cm): 25 cm  Measured From Lips  Secured Location Left  Secured By Charity fundraiser  Site Condition Dry  Cardiac  Pulse Regular  Heart Sounds S1, S2  Jugular Venous Distention (JVD) No  Cardiac Rhythm NSR  Antiarrhythmic device No  Vascular  R Radial Pulse +2  L Radial Pulse +2  R Dorsalis Pedis Pulse +2  L Dorsalis Pedis Pulse +2  Edema Generalized;Right upper extremity;Left upper extremity;Right lower extremity;Left lower extremity  Generalized Edema +2  RUE Edema Non-pitting  LUE Edema +1  RLE Edema +1  LLE Edema +1  Integumentary  Integumentary (WDL) X  Skin Color Appropriate for ethnicity  Skin Condition Dry  Skin Integrity Ecchymosis;Excoriated (scratch marks);Skin tear  Ecchymosis Location Other (Comment) (scattered)  Ecchymosis Location Orientation Other (Comment) (scattered)  Musculoskeletal  Musculoskeletal (WDL) X  Generalized Weakness Yes  Gastrointestinal  Bowel Sounds Assessment Active  NG/OG Tube Orogastric 14 Fr. Right mouth Documented cm marking at nare/ corner of mouth 44 cm  Placement Date/Time: 06/07/2019 1155   Tube Type: Orogastric  Tube Size (Fr.): 14 Fr.  Tube Location: Right mouth  Technique Used to Measure Tube Placement: Documented cm marking at nare/ corner of mouth  Initial cm Marking at Borders Group of Mouth (if ...  Site Assessment Clean;Dry;Intact  Ongoing Placement Verification No  change in cm markings or external length of tube from initial placement;No change in respiratory status;No acute changes, not attributed to clinical condition  Status Infusing tube feed  Drainage Appearance None  GU Assessment  Genitourinary (WDL) X  Genitourinary Symptoms Anuria  Psychosocial  Psychosocial (WDL) X  Patient Behaviors Not interactive  Emotional support given Given to patient

## 2019-06-20 NOTE — Progress Notes (Addendum)
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 Unable to wean due to severe brain damage and injury from strokes and inability to protect airway    Vent Mode: PCV FiO2 (%):  [28 %] 28 % Set Rate:  [16 bmp] 16 bmp PEEP:  [5 cmH20] 5 cmH20 Plateau Pressure:  [14 cmH20] 14 cmH20   BP (!) 102/38   Pulse 76   Temp 98.3 F (36.8 C) (Axillary)   Resp 13   Ht 5' 7.99" (1.727 m)   Wt 134 kg   SpO2 100%   BMI 44.93 kg/m    I/O last 3 completed shifts: In: 3872.3 [I.V.:2066.8; NG/GT:1452; IV Piggyback:353.5] Out: 0  Total I/O In: 931.7 [I.V.:303.7; NG/GT:628] Out: 2059 [Other:2059]  SpO2: 100 % O2 Flow Rate (L/min): 40 L/min FiO2 (%): 28 %   SIGNIFICANT EVENTS 12/21 Intubated, resp failure, acute CVA Perm cath removed, vasc cath LEFT groin, Triple lumen RT groin placed 12/22 on CRRT, septic shock, severe hypoxia, on vent, unable to wean due to hemodynamic instability 12/23 attemt neuro assessment today, follow up NEURO recs 12/24 remains on vent, try HD today, wean off CRRT 12/25 extubated for cuff leak and re-intubated 12/26 failed weaning trials due to encephalopathy from CVA and pneumonia 12/27-1/3 failure to wean, severe brain damage, severe resp failure 01/04 - remains obtunded, neurogenic breathing pattern off sedatives 01/05- D/Cd Precedex, started Propofol 01/06-neurogenic breathing pattern has resolved on propofol patient looks more comfortable   REVIEW OF SYSTEMS  PATIENT IS UNABLE TO PROVIDE COMPLETE REVIEW OF SYSTEM S DUE TO SEVERE CRITICAL ILLNESS AND ENCEPHALOPATHY   PHYSICAL EXAMINATION:  GENERAL:critically ill appearing, neurogenic breathing pattern  has ceased, patient appears comfortable on the ventilator  HEAD: Normocephalic, atraumatic.  EYES: Pupils equal, round, reactive to light. No scleral icterus.  MOUTH:Orotracheally intubated, OG in place. NECK: Supple. No thyromegaly. No nodules. No JVD.Trachea midline PULMONARY:Coarse breath sounds, no other adventitious sounds. CARDIOVASCULAR: S1 and S2. Regular rate and rhythm. No murmurs, rubs, or gallops.  GASTROINTESTINAL: Soft, non-distended. Positive bowel sounds.  MUSCULOSKELETAL: Nojointswelling,noclubbing or edema.  NEUROLOGIC:Unresponsive, opens eyes but does not track, neurogenic breathing resolved on propofol SKIN:intact,warm,dry  MEDICATIONS: I have reviewed all medications and confirmed regimen as documented   CULTURE RESULTS   Recent Results (from the past 240 hour(s))  CULTURE, BLOOD (ROUTINE X 2) w Reflex to ID Panel     Status: None   Collection Time: 06/11/19  1:09 PM   Specimen: BLOOD  Result Value Ref Range Status   Specimen Description BLOOD A-LINE DRAW  Final   Special Requests   Final    BOTTLES DRAWN AEROBIC AND ANAEROBIC Blood Culture adequate volume   Culture   Final    NO GROWTH 5 DAYS Performed at [facility], [address] [place], [place] [address]    Report Status 06/16/2019 FINAL  Final  CULTURE, BLOOD (ROUTINE X 2) w Reflex to ID Panel     Status: None   Collection Time: 06/11/19  1:10 PM   Specimen: BLOOD  Result Value Ref Range Status   Specimen Description BLOOD A-LINE DRAW  Final   Special Requests   Final    BOTTLES DRAWN AEROBIC AND  ANAEROBIC Blood Culture adequate volume   Culture   Final    NO GROWTH 5 DAYS Performed at [facility], [address] [place], [place] [address]    Report Status 06/16/2019 FINAL  Final  Culture, respiratory (non-expectorated)     Status: None   Collection Time: 06/11/19  6:04 PM   Specimen: Tracheal Aspirate; Respiratory  Result Value Ref Range Status   Specimen  Description   Final    TRACHEAL ASPIRATE Performed at [facility], [address] [place], [place] [address]    Special Requests   Final    NONE Performed at [facility], [address] [place], [place] [address]    Gram Stain   Final    MODERATE WBC PRESENT, PREDOMINANTLY PMN FEW YEAST Performed at [facility], [place] [address] [place], [place] [address]    Culture FEW CANDIDA ALBICANS  Final   Report Status 06/14/2019 FINAL  Final            Indwelling Urinary Catheter continued, requirement due to   Reason to continue Indwelling Urinary Catheter strict Intake/Output monitoring for hemodynamic instability   Central Line/ continued, requirement due to  Reason to continue [place] of central venous pressure or other hemodynamic parameters and poor IV access   Ventilator continued, requirement due to severe respiratory failure   Ventilator Sedation RASS 0 to -2        ASSESSMENT AND PLAN SYNOPSIS Severe ACUTE Hypoxic and Hypercapnic Respiratory Failurefrom pneumonia and acute aspirtaion pneumonia with inability to protect airway due to CVA, septic shockwith end-stage renal disease on dialysis with morbid obesity Post Covid pneumonitis and pneumonia with hypercoagulable state with acute CVA   Severe ACUTE Hypoxic and Hypercapnic Respiratory Failure -continue Mechanical Ventilator support -continue Bronchodilator Therapy -Wean Fio2 and PEEP as tolerated -VAP/VENT bundle implementation Will need trach for survivalif not transition to comfort care Long-term prognosis poor Placed on propofol yesterday for ventilator discomfort, appears very comfortable today  ent [place]  KIDNEY INJURY/Renal Failure ESRD -follow chem 7 -follow UO -continue Foley Catheter-assess need -Avoid nephrotoxic agents HD AS NEEDED    NEUROLOGY ACUTEMULTIFOCALCVA WITH SEVEREBRAIN DAMAGE UNABLE TO SURVIVE WITHOUT TRACH - intubated  and sedated - minimal sedation to achieve a RASS goal: -1    INFECTIOUS DISEASE POST COVID 19 INFECTION PNEUMONITIS WITH ASPIRATION PNEUMONIA AND LLL PNEUMONIA   GI GI PROPHYLAXIS as indicated  NUTRITIONAL STATUS DIET-->TF's as tolerated Constipation protocol as indicated   NEUROLOGY - intubated and sedated - minimal sedation to achieve a RASS goal: -1 Neurogenic breathing pattern, has been on Precedex and fentanyl, transition to propofol for ventilator associated discomfort   ELECTROLYTES -follow labs as needed -replace as needed -pharmacy consultation and following    Critical Care Time devoted to patient care services described in this note is 35 minutes.   Overall, patient is critically ill, prognosis is guarded.  Patient with multiorgan failure and at high risk for cardiac arrest and death.   SEVERE BRAIN DAMAGE FROM CVA'S POST COVID PNEUMONITIS WITH HYPERCOAGULABLE STATE WITH SEVERE CVA'S VERY POOR PROGNOSIS  PALLIATIVE CARE DISCUSSION WITH NEURO AND FAMILY, family still out of the country.  Discussed during multidisciplinary rounds.  Renold Don, MD [facility]   This note was dictated using voice recognition software/Dragon.  Despite best efforts to proofread, errors can occur which can change the meaning.  Any change was purely unintentional.

## 2019-06-21 ENCOUNTER — Inpatient Hospital Stay: Payer: Medicare Other

## 2019-06-21 LAB — BASIC METABOLIC PANEL
Anion gap: 13 (ref 5–15)
BUN: 112 mg/dL — ABNORMAL HIGH (ref 6–20)
CO2: 25 mmol/L (ref 22–32)
Calcium: 7.4 mg/dL — ABNORMAL LOW (ref 8.9–10.3)
Chloride: 96 mmol/L — ABNORMAL LOW (ref 98–111)
Creatinine, Ser: 6.73 mg/dL — ABNORMAL HIGH (ref 0.61–1.24)
GFR calc Af Amer: 10 mL/min — ABNORMAL LOW (ref >60)
GFR calc non Af Amer: 9 mL/min — ABNORMAL LOW (ref >60)
Glucose, Bld: 173 mg/dL — ABNORMAL HIGH (ref 70–99)
Potassium: 4.5 mmol/L (ref 3.5–5.1)
Sodium: 134 mmol/L — ABNORMAL LOW (ref 135–145)

## 2019-06-21 LAB — CBC WITH DIFFERENTIAL/PLATELET
Abs Immature Granulocytes: 0.05 10*3/uL (ref 0.00–0.07)
Basophils Absolute: 0.1 10*3/uL (ref 0.0–0.1)
Basophils Relative: 1 %
Eosinophils Absolute: 0.3 10*3/uL (ref 0.0–0.5)
Eosinophils Relative: 5 %
HCT: 23.8 % — ABNORMAL LOW (ref 39.0–52.0)
Hemoglobin: 7.4 g/dL — ABNORMAL LOW (ref 13.0–17.0)
Immature Granulocytes: 1 %
Lymphocytes Relative: 12 %
Lymphs Abs: 0.9 10*3/uL (ref 0.7–4.0)
MCH: 30.8 pg (ref 26.0–34.0)
MCHC: 31.1 g/dL (ref 30.0–36.0)
MCV: 99.2 fL (ref 80.0–100.0)
Monocytes Absolute: 0.8 10*3/uL (ref 0.1–1.0)
Monocytes Relative: 11 %
Neutro Abs: 5.2 10*3/uL (ref 1.7–7.7)
Neutrophils Relative %: 70 %
Platelets: 286 10*3/uL (ref 150–400)
RBC: 2.4 MIL/uL — ABNORMAL LOW (ref 4.22–5.81)
RDW: 14.4 % (ref 11.5–15.5)
WBC: 7.3 10*3/uL (ref 4.0–10.5)
nRBC: 0 % (ref 0.0–0.2)

## 2019-06-21 LAB — GLUCOSE, CAPILLARY
Glucose-Capillary: 171 mg/dL — ABNORMAL HIGH (ref 70–99)
Glucose-Capillary: 151 mg/dL — ABNORMAL HIGH (ref 70–99)
Glucose-Capillary: 107 mg/dL — ABNORMAL HIGH (ref 70–99)
Glucose-Capillary: 139 mg/dL — ABNORMAL HIGH (ref 70–99)
Glucose-Capillary: 107 mg/dL — ABNORMAL HIGH (ref 70–99)

## 2019-06-21 LAB — HEMOGLOBIN AND HEMATOCRIT, BLOOD
HCT: 24.3 % — ABNORMAL LOW (ref 39.0–52.0)
Hemoglobin: 7.5 g/dL — ABNORMAL LOW (ref 13.0–17.0)

## 2019-06-21 LAB — PHOSPHORUS
Phosphorus: 7.4 mg/dL — ABNORMAL HIGH (ref 2.5–4.6)
Phosphorus: 7.1 mg/dL — ABNORMAL HIGH (ref 2.5–4.6)

## 2019-06-21 NOTE — Progress Notes (Signed)
Blood noted on floor and underneath pt after trialysis catheter removed. Pressure held and PAD applied. Bleeding has stopped. Charge nurse and MD aware.

## 2019-06-21 NOTE — Progress Notes (Signed)
PALLIATIVE NOTE:  Attempted to contact daughter and patient's mother on listed numbers at 0930 and most recently at 48. Unsuccessful both times. Will continue to contact family for goals of care.   Chart reviewed and updates received per RN.   Pending goals of care and recommendations once Palliative is able to speak or meet with appropriate family members.   Alda Lea, AGPCNP-BC Palliative Medicine Team   NO CHARGE

## 2019-06-21 NOTE — Progress Notes (Signed)
[facility]  ROUNDING NOTE   Subjective:  Patient seen and evaluated at bedside. Still on the ventilator. Due for dialysis again tomorrow.  Objective:  Vital signs in last 24 hours:  Temp:  [98.1 F (36.7 C)-98.7 F (37.1 C)] 98.4 F (36.9 C) (01/07 0800) Pulse Rate:  [73-86] 74 (01/07 1100) Resp:  [13-23] 15 (01/07 1100) BP: (87-158)/(34-62) 87/34 (01/07 1100) SpO2:  [98 %-100 %] 100 % (01/07 1138) FiO2 (%):  [28 %] 28 % (01/07 1138) Weight:  [134 kg-136.2 kg] 134 kg (01/06 1915)  Weight change:  Filed Weights   06/07/19 0248 06/20/19 1600 06/20/19 1915  Weight: 122.7 kg (!) 136.2 kg 134 kg    Intake/Output: I/O last 3 completed shifts: In: 3937.5 [I.V.:2017.8; NG/GT:1520; IV Piggyback:399.7] Out: 2109 [Other:2059; Stool:50]   Intake/Output this shift:  Total I/O In: 131.7 [I.V.:131.7] Out: 0   Physical Exam: General: Critically ill   HEENT: ETT  Lungs:  Vent assisted, bilateral rhonchi  Heart: Regular, 3/6 systolic murmur  Abdomen:  Soft, nontender, obese  Extremities: trace peripheral edema.  Neurologic: Intubated and sedated  Skin: No lesions  Access: Left femoral temp HD catheter 12/21 Dr. Lucky Cowboy Left Arm AVF Dr. Lucky Cowboy 4/97/02    Basic Metabolic Panel: Recent Labs  Lab 06/17/19 0359 06/18/19 0455 06/19/19 6378 06/20/19 0445 06/21/19 0014 06/21/19 0432  NA 138 136 136 132*  --  134*  K 5.0 5.5* 4.5 4.9  --  4.5  CL 98 98 94* 93*  --  96*  CO2 '24 22 26 24  '$ --  25  GLUCOSE 190* 201* 208* 180*  --  173*  BUN 114* 149* 108* 138*  --  112*  CREATININE 8.85* 10.03* 7.32* 8.52*  --  6.73*  CALCIUM 7.6* 7.7* 7.5* 7.1*  --  7.4*  MG  --   --   --  2.1  --   --   PHOS  --   --   --  9.0* 7.4* 7.1*    Liver Function Tests: No results for input(s): AST, ALT, ALKPHOS, BILITOT, PROT, ALBUMIN in the last 168 hours. No results for input(s): LIPASE, AMYLASE in the last 168 hours. No results for input(s): AMMONIA in the last 168  hours.  CBC: Recent Labs  Lab 06/17/19 0359 06/18/19 0455 06/19/19 0538 06/20/19 0445 06/21/19 0432  WBC 9.6 7.8 6.0 8.7 7.3  NEUTROABS 7.1 5.5 4.1 5.9 5.2  HGB 7.4* 7.4* 8.6* 7.5* 7.4*  HCT 24.8* 22.7* 27.8* 24.4* 23.8*  MCV 100.8* 95.4 99.6 99.6 99.2  PLT 355 317 301 327 286    Cardiac Enzymes: No results for input(s): CKTOTAL, CKMB, CKMBINDEX, TROPONINI in the last 168 hours.  BNP: Invalid input(s): POCBNP  CBG: Recent Labs  Lab 06/20/19 2013 06/20/19 2351 06/21/19 0342 06/21/19 0823 06/21/19 1154  GLUCAP 118* 190* 171* 151* 107*    Microbiology: Results for orders placed or performed during the hospital encounter of 06/06/2019  Respiratory Panel by RT PCR (Flu A&B, Covid) - Nasopharyngeal Swab     Status: None   Collection Time: 05/30/2019  2:35 AM   Specimen: Nasopharyngeal Swab  Result Value Ref Range Status   SARS Coronavirus 2 by RT PCR NEGATIVE NEGATIVE Final    Comment: (NOTE) SARS-CoV-2 target nucleic acids are NOT DETECTED. The SARS-CoV-2 RNA is generally detectable in upper respiratoy specimens during the acute phase of infection. The lowest concentration of SARS-CoV-2 viral copies this assay can detect is 131 copies/mL. A negative result  does not preclude SARS-Cov-2 infection and should not be used as the sole basis for treatment or other patient management decisions. A negative result may occur with  improper specimen collection/handling, submission of specimen other than nasopharyngeal swab, presence of viral mutation(s) within the areas targeted by this assay, and inadequate number of viral copies (<131 copies/mL). A negative result must be combined with clinical observations, patient history, and epidemiological information. The expected result is Negative. Fact Sheet for Patients:  PinkCheek.be Fact Sheet for Healthcare Providers:  GravelBags.it This test is not yet ap proved or cleared  by the [place] FDA and  has been authorized for detection and/or diagnosis of SARS-CoV-2 by FDA under an Emergency Use Authorization (EUA). This EUA will remain  in effect (meaning this test can be used) for the duration of the COVID-19 declaration under Section 564(b)(1) of the Act, 21 U.S.C. section 360bbb-3(b)(1), unless the authorization is terminated or revoked sooner.    Influenza A by PCR NEGATIVE NEGATIVE Final   Influenza B by PCR NEGATIVE NEGATIVE Final    Comment: (NOTE) The Xpert Xpress SARS-CoV-2/FLU/RSV assay is intended as an aid in  the diagnosis of influenza from Nasopharyngeal swab specimens and  should not be used as a sole basis for treatment. Nasal washings and  aspirates are unacceptable for Xpert Xpress SARS-CoV-2/FLU/RSV  testing. Fact Sheet for Patients: PinkCheek.be Fact Sheet for Healthcare Providers: GravelBags.it This test is not yet approved or cleared by the [place] FDA and  has been authorized for detection and/or diagnosis of SARS-CoV-2 by  FDA under an Emergency Use Authorization (EUA). This EUA will remain  in effect (meaning this test can be used) for the duration of the  Covid-19 declaration under Section 564(b)(1) of the Act, 21  U.S.C. section 360bbb-3(b)(1), unless the authorization is  terminated or revoked. Performed at [facility], [address] [place], [place] [address]   Culture, blood (routine x 2)     Status: None   Collection Time: 05/16/2019  2:36 AM   Specimen: BLOOD  Result Value Ref Range Status   Specimen Description BLOOD LEFT ANTECUBITAL  Final   Special Requests   Final    BOTTLES DRAWN AEROBIC AND ANAEROBIC Blood Culture adequate volume   Culture   Final    NO GROWTH 5 DAYS Performed at [facility], [address], [place], [place] [address]    Report Status 06/07/2019 FINAL  Final  Culture, blood (routine x 2)     Status: None    Collection Time: 05/26/2019  2:36 AM   Specimen: BLOOD  Result Value Ref Range Status   Specimen Description BLOOD RIGHT FOREARM  Final   Special Requests   Final    BOTTLES DRAWN AEROBIC AND ANAEROBIC Blood Culture adequate volume   Culture   Final    NO GROWTH 5 DAYS Performed at [facility], [address] [place], [place] [address]    Report Status 06/07/2019 FINAL  Final  Culture, blood (routine x 2)     Status: None   Collection Time: 05/26/2019  3:10 PM   Specimen: BLOOD  Result Value Ref Range Status   Specimen Description BLOOD A-LINE  Final   Special Requests   Final    BOTTLES DRAWN AEROBIC AND ANAEROBIC Blood Culture results may not be optimal due to an excessive volume of blood received in culture bottles   Culture   Final    NO GROWTH 5 DAYS Performed at [facility], [place], [place] [address]    Report Status 06/07/2019 FINAL  Final  Culture, blood (routine x 2)     Status: None   Collection Time: 06/07/2019  3:10 PM   Specimen: BLOOD  Result Value Ref Range Status   Specimen Description BLOOD A-LINE  Final   Special Requests   Final    BOTTLES DRAWN AEROBIC AND ANAEROBIC Blood Culture results may not be optimal due to an excessive volume of blood received in culture bottles   Culture   Final    NO GROWTH 5 DAYS Performed at [facility], [address] [place], [place] [address]    Report Status 06/07/2019 FINAL  Final  MRSA PCR Screening     Status: None   Collection Time: 06/03/19  5:45 PM   Specimen: Nasopharyngeal  Result Value Ref Range Status   MRSA by PCR NEGATIVE NEGATIVE Final    Comment:        The GeneXpert MRSA Assay (FDA approved for NASAL specimens only), is one component of a comprehensive MRSA colonization surveillance program. It is not intended to diagnose MRSA infection nor to guide or monitor treatment for MRSA infections. Performed at [facility], [address],  [place], [place] [address]   Urine Culture     Status: None   Collection Time: 06/03/19  6:20 PM   Specimen: Urine, Catheterized  Result Value Ref Range Status   Specimen Description   Final    URINE, CATHETERIZED Performed at [facility], [address] [place], [place] [address]    Special Requests   Final    Immunocompromised Performed at [facility], [address], [place], [place] [address]    Culture   Final    NO GROWTH Performed at [facility], [place] [address], [place], [place] [address]    Report Status 06/05/2019 FINAL  Final  Cath Tip Culture     Status: None   Collection Time: 06/03/2019  4:32 PM   Specimen: Catheter Tip; Other  Result Value Ref Range Status   Specimen Description   Final    CATH TIP Performed at [facility], [address], [place], [place] [address]    Special Requests   Final    NONE Performed at [facility], [address] [place], [place] [address]    Culture   Final    NO GROWTH 2 DAYS Performed at [facility], [place] [address] [place], [place] [address]    Report Status 06/07/2019 FINAL  Final  Culture, respiratory (non-expectorated)     Status: None   Collection Time: 06/06/19 10:22 AM   Specimen: Tracheal Aspirate; Respiratory  Result Value Ref Range Status   Specimen Description   Final    TRACHEAL ASPIRATE Performed at [facility], [address], [place], [place] [address]    Special Requests   Final    NONE Performed at [facility], [address] [place], [place] [address]    Gram Stain   Final    RARE WBC PRESENT, PREDOMINANTLY PMN NO ORGANISMS SEEN Performed at [facility], [place] [address], [place], [place] [address]    Culture   Final    RARE CANDIDA ALBICANS RARE FUNGUS (MOLD) ISOLATED, PROBABLE CONTAMINANT/COLONIZER (SAPROPHYTE). CONTACT MICROBIOLOGY IF FURTHER IDENTIFICATION REQUIRED [id number].    Report Status 06/09/2019 FINAL   Final  CULTURE, BLOOD (ROUTINE X 2) w Reflex to ID  Panel     Status: None   Collection Time: 06/11/19  1:09 PM   Specimen: BLOOD  Result Value Ref Range Status   Specimen Description BLOOD A-LINE DRAW  Final   Special Requests   Final    BOTTLES DRAWN AEROBIC AND ANAEROBIC Blood Culture adequate volume   Culture   Final    NO GROWTH 5 DAYS Performed at [facility], [address] [place], [place] [address]    Report Status 06/16/2019 FINAL  Final  CULTURE, BLOOD (ROUTINE X 2) w Reflex to ID Panel     Status: None   Collection Time: 06/11/19  1:10 PM   Specimen: BLOOD  Result Value Ref Range Status   Specimen Description BLOOD A-LINE DRAW  Final   Special Requests   Final    BOTTLES DRAWN AEROBIC AND ANAEROBIC Blood Culture adequate volume   Culture   Final    NO GROWTH 5 DAYS Performed at [facility], [address], [place], [place] [address]    Report Status 06/16/2019 FINAL  Final  Culture, respiratory (non-expectorated)     Status: None   Collection Time: 06/11/19  6:04 PM   Specimen: Tracheal Aspirate; Respiratory  Result Value Ref Range Status   Specimen Description   Final    TRACHEAL ASPIRATE Performed at [facility], [address], [place], [place] [address]    Special Requests   Final    NONE Performed at [facility], [address] [place], [place] [address]    Gram Stain   Final    MODERATE WBC PRESENT, PREDOMINANTLY PMN FEW YEAST Performed at [facility], [place] [address], [place], [place] [address]    Culture FEW CANDIDA ALBICANS  Final   Report Status 06/14/2019 FINAL  Final    Coagulation Studies: No results for input(s): LABPROT, INR in the last 72 hours.  Urinalysis: No results for input(s): COLORURINE, LABSPEC, PHURINE, GLUCOSEU, HGBUR, BILIRUBINUR, KETONESUR, PROTEINUR, UROBILINOGEN, NITRITE, LEUKOCYTESUR in the last 72 hours.  Invalid input(s): APPERANCEUR    Imaging: DG Abd 1  View  Result Date: 06/21/2019 CLINICAL DATA:  49 year old male enteric tube placement. EXAM: ABDOMEN - 1 VIEW COMPARISON:  Portable abdomen 05/22/2019 and earlier. FINDINGS: Portable AP semi upright view at 0954 hours. Enteric tube terminates in the distal gastric body, side hole the level of the gastric body. Stable visible lung bases. Negative visible bowel gas pattern. No acute osseous abnormality identified. IMPRESSION: Enteric tube in the stomach, side hole at the level of the gastric body. Electronically Signed   By: Genevie Ann M.D.   On: 06/21/2019 10:29     Medications:   . sodium chloride 5 mL/hr at 06/21/19 0828  . dexmedetomidine (PRECEDEX) IV infusion Stopped (06/19/19 1244)  . fentaNYL infusion INTRAVENOUS 300 mcg/hr (06/21/19 1054)  . levETIRAcetam Stopped (06/21/19 0407)  . norepinephrine (LEVOPHED) Adult infusion Stopped (06/16/19 2312)  . propofol (DIPRIVAN) infusion 25 mcg/kg/min (06/21/19 0828)   .  stroke: mapping our early stages of recovery book   Does not apply Once  . aspirin  324 mg Per Tube Daily  . atorvastatin  80 mg Per Tube q morning - 10a  . B-complex with vitamin C  1 tablet Per Tube Daily  . calcitRIOL  0.25 mcg Per Tube Daily  . chlorhexidine gluconate (MEDLINE KIT)  15 mL Mouth Rinse BID  . Chlorhexidine Gluconate Cloth  6 each Topical Q0600  . cholestyramine  4 g  Oral BID  . clopidogrel  75 mg Per Tube Daily  . epoetin (EPOGEN/PROCRIT) injection  10,000 Units Intravenous Q M,W,F-HD  . feeding supplement (PRO-STAT SUGAR FREE 64)  60 mL Per Tube TID  . feeding supplement (VITAL HIGH PROTEIN)  1,000 mL Per Tube Q24H  . insulin aspart  0-9 Units Subcutaneous Q4H  . ipratropium-albuterol  3 mL Nebulization TID  . mouth rinse  15 mL Mouth Rinse 10 times per day  . pantoprazole sodium  40 mg Per Tube Daily  . sevelamer carbonate  2.4 g Oral TID WC  . sodium chloride flush  10-40 mL Intracatheter Q12H   acetaminophen **OR** acetaminophen, fentaNYL, heparin,  ipratropium-albuterol, labetalol, midazolam, ondansetron **OR** ondansetron (ZOFRAN) IV, sodium chloride flush, vecuronium  Assessment/ Plan:  Jake Gomez is a 49 y.o. Hispanic male with end stage renal disease, diabetes mellitus type I, hypertension, sleep apnea who was admitted to [facility] on 05/30/2019 for Hyperkalemia [E87.5] CVA (cerebral vascular accident) ([place]) [I63.9] Stroke ([place]) [I63.9] ESRD (end stage renal disease) on dialysis ([place]) [N18.6, Z99.2] Chest pain [R07.9]  [facility] [address] RIJ 120kg  1. End stage renal disease on hemodialysis. Requiring CRRT from 12/21 to 12/24 -Patient completed dialysis treatment yesterday.  No acute indication for dialysis today.  We will plan for dialysis tomorrow.  2. Acute Resp failure - Pneumonia: with acute respiratory failure requiring intubation and mechanical ventilation.    -Patient remains on ventilatory support.  Primary team awaiting family to arrive to determine further disposition as to whether he will require tracheostomy versus a more palliative approach.  3. Anemia of chronic kidney disease: macrocytic.  Lab Results  Component Value Date   HGB 7.4 (L) 06/21/2019   -Maintain the patient on Epogen with dialysis.  Defer decisions regarding blood transfusion to primary team.  4. Secondary Hyperparathyroidism with hypocalcemia and hyperphosphatemia.   Lab Results  Component Value Date   CALCIUM 7.4 (L) 06/21/2019   PHOS 7.1 (H) 06/21/2019  Phosphorus down slightly to 7.1.  Maintain the patient on Renvela powder.  5. Acute ischemic stroke Repeat MRI done Remains encephalopathic     LOS: 19 Jake Gomez 1/7/202112:05 PM

## 2019-06-21 NOTE — Progress Notes (Signed)
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  SpO2: 100 % O2 Flow Rate (L/min): 40 L/min FiO2 (%): 28 %   SIGNIFICANT EVENTS 12/21 Intubated, resp failure, acute CVA Perm cath removed, vasc cath LEFT groin, Triple lumen RT groin placed 12/22 on CRRT, septic shock, severe hypoxia, on vent, unable to wean due to hemodynamic instability 12/23 attemt neuro assessment today, follow up NEURO recs 12/24 remains on vent, try HD today, wean off CRRT 12/25 extubated for cuff leak and re-intubated 12/26 failed weaning trials due to encephalopathy from CVA and pneumonia 12/27-1/3 failure to wean, severe brain damage, severe resp failure 01/04 - remains obtunded, neurogenic breathing pattern off sedatives 01/05- D/Cd Precedex, started Propofol 01/06 appears more comfortable on the ventilator   REVIEW OF SYSTEMS  PATIENT IS UNABLE TO PROVIDE COMPLETE REVIEW OF SYSTEM S DUE TO SEVERE CRITICAL ILLNESS AND ENCEPHALOPATHY   PHYSICAL EXAMINATION:  GENERAL:critically ill appearing, neurogenic breathing pattern has ceased, patient appears comfortable on the ventilator   HEAD: Normocephalic, atraumatic.  EYES: Pupils equal, round, reactive to light. No scleral icterus.  MOUTH:Orotracheally intubated, OG in place. NECK: Supple. No thyromegaly. No nodules. No JVD.Trachea midline PULMONARY:Coarse breath sounds, no other adventitious sounds. CARDIOVASCULAR: S1 and S2. Regular rate and rhythm. No murmurs, rubs, or gallops.  GASTROINTESTINAL: Soft, non-distended. Positive bowel sounds.  MUSCULOSKELETAL: Nojointswelling,noclubbing or edema.  NEUROLOGIC:Unresponsive, opens eyes but does not track, neurogenic breathing pattern has resolved on propofol  SKIN:intact,warm,dry   MEDICATIONS: I have reviewed all medications and confirmed regimen as documented   CULTURE RESULTS   No results found for this or any previous visit (from the past 240 hour(s)).          Indwelling Urinary Catheter continued, requirement due to   Reason to continue Indwelling Urinary Catheter strict Intake/Output monitoring for hemodynamic instability   Central Line/ continued, requirement due to  Reason to continue [place] of central venous pressure or other hemodynamic parameters and poor IV access   Ventilator continued, requirement due to severe respiratory failure   Ventilator Sedation RASS 0 to -2        ASSESSMENT AND PLAN SYNOPSIS Severe ACUTE Hypoxic and Hypercapnic Respiratory Failurefrom pneumonia and acute aspirtaion pneumonia with inability to protect airway due to CVA, septic shockwith end-stage renal disease on dialysis with morbid obesity Post Covid pneumonitis and pneumonia with hypercoagulable state with acute CVA   Severe ACUTE Hypoxic and Hypercapnic Respiratory Failure -continue Mechanical Ventilator support -continue Bronchodilator Therapy -Wean Fio2 and PEEP as tolerated -VAP/VENT bundle implementation Will need trach  for survivalif not transition to comfort care Long-term prognosis poor On propofol for ventilator  discomfort, continues to be very comfortable Cannot perform SBT/SAT duecannot perform SBT/SAT cannot perform SBT SAT ent FORT  KIDNEY INJURY/Renal Failure ESRD -follow chem 7 -follow UO -continue Foley Catheter-assess need -Avoid nephrotoxic agents HD AS NEEDED   NEUROLOGY ACUTEMULTIFOCALCVA WITH SEVEREBRAIN DAMAGE UNABLE TO SURVIVE WITHOUT TRACH - intubated and sedated - minimal sedation to achieve a RASS goal: -1    INFECTIOUS DISEASE POST COVID 19 INFECTION PNEUMONITIS WITH ASPIRATION PNEUMONIA AND LLL PNEUMONIA   GI GI PROPHYLAXIS as indicated  NUTRITIONAL STATUS DIET-->TF's as tolerated Constipation protocol as indicated   NEUROLOGY - intubated and sedated - minimal sedation to achieve a RASS goal: -1 Neurogenic breathing pattern, has been on Precedex and fentanyl, transition to propofol for ventilator associated discomfort   ELECTROLYTES -follow labs as needed -replace as needed -pharmacy consultation and following    Critical Care Time devoted to patient care services described in this note is 35 minutes.   Overall, patient is critically ill, prognosis is guarded.  Patient with Multiorgan failure and at high risk for cardiac arrest and death.   SEVERE BRAIN DAMAGE FROM CVA'S POST COVID PNEUMONITIS WITH HYPERCOAGULABLE STATE WITH SEVERE CVA'S VERY POOR PROGNOSIS  PALLIATIVE CARE DISCUSSION WITH NEURO AND FAMILY when family available  C. Derrill Kay, MD [facility]   This note was dictated using voice recognition software/Dragon.  Despite best efforts to proofread, errors can occur which can change the meaning.  Any change was purely unintentional.

## 2019-06-22 ENCOUNTER — Inpatient Hospital Stay: Payer: Medicare Other

## 2019-06-22 LAB — CBC
HCT: 21.6 % — ABNORMAL LOW (ref 39.0–52.0)
Hemoglobin: 6.8 g/dL — ABNORMAL LOW (ref 13.0–17.0)
MCH: 30.9 pg (ref 26.0–34.0)
MCHC: 31.5 g/dL (ref 30.0–36.0)
MCV: 98.2 fL (ref 80.0–100.0)
Platelets: 273 10*3/uL (ref 150–400)
RBC: 2.2 MIL/uL — ABNORMAL LOW (ref 4.22–5.81)
RDW: 14.6 % (ref 11.5–15.5)
WBC: 7.7 10*3/uL (ref 4.0–10.5)
nRBC: 0 % (ref 0.0–0.2)

## 2019-06-22 LAB — GLUCOSE, CAPILLARY
Glucose-Capillary: 141 mg/dL — ABNORMAL HIGH (ref 70–99)
Glucose-Capillary: 144 mg/dL — ABNORMAL HIGH (ref 70–99)
Glucose-Capillary: 159 mg/dL — ABNORMAL HIGH (ref 70–99)
Glucose-Capillary: 162 mg/dL — ABNORMAL HIGH (ref 70–99)
Glucose-Capillary: 163 mg/dL — ABNORMAL HIGH (ref 70–99)
Glucose-Capillary: 170 mg/dL — ABNORMAL HIGH (ref 70–99)
Glucose-Capillary: 180 mg/dL — ABNORMAL HIGH (ref 70–99)

## 2019-06-22 LAB — RENAL FUNCTION PANEL
Albumin: 1.9 g/dL — ABNORMAL LOW (ref 3.5–5.0)
Anion gap: 14 (ref 5–15)
BUN: 132 mg/dL — ABNORMAL HIGH (ref 6–20)
CO2: 23 mmol/L (ref 22–32)
Calcium: 7.1 mg/dL — ABNORMAL LOW (ref 8.9–10.3)
Chloride: 94 mmol/L — ABNORMAL LOW (ref 98–111)
Creatinine, Ser: 7.93 mg/dL — ABNORMAL HIGH (ref 0.61–1.24)
GFR calc Af Amer: 9 mL/min — ABNORMAL LOW (ref >60)
GFR calc non Af Amer: 7 mL/min — ABNORMAL LOW (ref >60)
Glucose, Bld: 170 mg/dL — ABNORMAL HIGH (ref 70–99)
Phosphorus: 8 mg/dL — ABNORMAL HIGH (ref 2.5–4.6)
Potassium: 5.4 mmol/L — ABNORMAL HIGH (ref 3.5–5.1)
Sodium: 131 mmol/L — ABNORMAL LOW (ref 135–145)

## 2019-06-22 LAB — TRIGLYCERIDES: Triglycerides: 200 mg/dL — ABNORMAL HIGH (ref <150)

## 2019-06-22 NOTE — Plan of Care (Signed)
  Problem: Clinical Measurements: Goal: Ability to maintain clinical measurements within normal limits will improve Outcome: Progressing Goal: Will remain free from infection Outcome: Progressing Goal: Diagnostic test results will improve Outcome: Progressing Goal: Respiratory complications will improve Outcome: Progressing Goal: Cardiovascular complication will be avoided Outcome: Progressing  Pt tolerated well the HD Tx. Pt continues to be intubated and sedated, BP stable throughout the tx. Tx runn for 3hrs93mn on 2K2.5Ca prescribed UF Goal of [id number] met and EPO 10K was given

## 2019-06-22 NOTE — Progress Notes (Signed)
Pre HD Treatment  Pt is on continuous propofol 32mcg /kg/min, fentanyl 200 mcg/hr, and maintenance IV fluid 0.9%NS.  Pt is sedated but responds to pain and voice, BP WDL  To start treatment pt is currently intubated on FIO2 28.   06/22/19 1411  Hand-Off documentation  Report given to (Full Name) Newt Minion RN   Report received from (Full Name) Adalberto Ill RN  Vital Signs  Temp 98.7 F (37.1 C)  Temp Source Oral  Pulse Rate 93  Pulse Rate Source Monitor  Resp 18  BP (!) 163/50  BP Location Right Leg  BP Method Automatic  Patient Position (if appropriate) Lying  Oxygen Therapy  O2 Device Ventilator  FiO2 (%) 28 %  Pain Assessment  Pain Scale Faces  Pain Score 0  Time-Out for Hemodialysis  What Procedure? HD  Pt Identifiers(min of two) First/Last Name;MRN/Account#  Correct Site? Yes  Correct Side? Yes  Correct Procedure? Yes  Consents Verified? Yes  Safety Precautions Reviewed? Yes  Engineer, civil (consulting) Number 5  Station Number  [id number])  UF/Alarm Test Passed  Conductivity: Meter 14  Conductivity: Machine  14  pH 7.6  Reverse Osmosis  (Portable RO 2/21)  Normal Saline Lot Number [id number]  Dialyzer Lot Number [id number]  Disposable Set Lot Number [id number]  Machine Temperature 98.6 F (37 C)  Musician and Audible Yes  Blood Lines Intact and Secured Yes  Pre Treatment Patient Checks  Vascular access used during treatment Fistula  HD catheter dressing before treatment WDL  Patient is receiving dialysis in a chair  (IN BED)  Hepatitis B Surface Antigen Results Negative  Date Hepatitis B Surface Antigen Drawn 05/08/19  Hepatitis B Surface Antibody  (>10)  Date Hepatitis B Surface Antibody Drawn 05/08/19  Hemodialysis Consent Verified Yes  Hemodialysis Standing Orders Initiated Yes  ECG (Telemetry) Monitor On Yes  Prime Ordered Normal Saline  Length of  DialysisTreatment -hour(s) 3.5 Hour(s)  Dialysis Treatment Comments  (NA140)  Dialyzer Elisio 17H NR   Dialysate 2K;2.5 Ca  Dialysis Anticoagulant None  Dialysate Flow Ordered 600  Blood Flow Rate Ordered 400 mL/min  Ultrafiltration Goal 1.5 Liters  Pre Treatment Labs Phosphorus  Dialysis Blood Pressure Support Ordered Albumin  Education / Care Plan  Dialysis Education Provided Yes  Documented Education in Care Plan Yes  Fistula / Graft Left Upper arm Arteriovenous fistula  No Placement Date or Time found.   Placed prior to admission: Yes  Orientation: Left  Access Location: Upper arm  Access Type: Arteriovenous fistula  Site Condition No complications  Fistula / Graft Assessment Present;Thrill;Bruit  Status Accessed  Needle Size 15  Drainage Description None

## 2019-06-22 NOTE — Progress Notes (Addendum)
CRITICAL CARE PROGRESS NOTE    Name: Ariel Wingrove MRN: [id number] DOB: [birth year]     LOS: 72   SUBJECTIVE FINDINGS & SIGNIFICANT EVENTS   Patient description: Mr. Rakes is a 49 yo M with a history of ESRD, OSA, and DMT2.  He presented to the ED on 12/19 with hyperkalemia after missing hemodialysis.  On workup, he was found to have an acute ischemic stroke, and pneumonia.  He was admitted to the ICU with acute respiratory distress.  COVID +   Lines / Drains: ET tube x 14 days on vent FiO2 28% PEEP 5.   Fistula Left upper arm Hemodialysis catheter right IJ PIV x2 right forearm, right upper arm Rectal tube NG/OG tube  Cultures / Sepsis markers: WBC stable at 7.3.  Patient afebrile for past 24 hours.    Tracheal aspirate culture on 12/28 demonstrated few candida albicans.  Blood cultures 12/28 showed no growth.    Protocols / Consultants: Nephrology Neurology  Tests / Events: 12/19: presented to the ED for malaise after missing 2 dialysis sessions.  Potassium 6.4, Creatinine 15.4.  Acute ischemic CVA, with negative MR angiogram.   12/20: CT angio chest demonstrates consolidation in the left lower lobe.  No evidence of PE.   12/29  Has become diaphoretic this AM with weaning fentanyl, and episodes of tachypnea over-riding ventilator.  Meeting in person with family  12/30- relatively unchanged with hemineglect, s/p repeat MRI -increased size brain infarcts 1/1 - no impromevent in mentation, continuing plan for end of life care decisions with family. Repeat CXR today to eval interval changes post tx for asp pna. Repeat MRI discussed with family.    1/2-1/8: failure to wean off vent, severe brain damage, severe respiratory failure.  Unable to contact patient's family.  Told they are in [place].   1/8 unable to  wean from Vent  CC Follow up severe acute respiratory failure  HPI - Severe hypoxia with left sided pneumonia on vent - S/P CVA - Severe neurologic deficit - Continues to require sedation  PAST MEDICAL HISTORY   Past Medical History:  Diagnosis Date  . Diabetes mellitus without complication ([place])   . Hypertension   . Renal disorder   . Sleep apnea    can't afford CPAP     SURGICAL HISTORY   Past Surgical History:  Procedure Laterality Date  . AV FISTULA PLACEMENT Left 01/31/2019   Procedure: ARTERIOVENOUS (AV) FISTULA CREATION ( BRACHIOCEPHALIC );  Surgeon: Algernon Huxley, MD;  Location: [facility];  Service: Vascular;  Laterality: Left;  . DIALYSIS/PERMA CATHETER INSERTION N/A 07/19/2018   Procedure: DIALYSIS/PERMA CATHETER INSERTION;  Surgeon: Algernon Huxley, MD;  Location: [facility];  Service: Cardiovascular;  Laterality: N/A;  . DIALYSIS/PERMA CATHETER INSERTION N/A 09/13/2018   Procedure: DIALYSIS/PERMA CATHETER INSERTION;  Surgeon: Katha Cabal, MD;  Location: [facility];  Service: Cardiovascular;  Laterality: N/A;  . DIALYSIS/PERMA CATHETER REMOVAL N/A 06/03/2019   Procedure: DIALYSIS/PERMA CATHETER REMOVAL, temporary dialysis catheter placement;  Surgeon: Algernon Huxley, MD;  Location: [facility];  Service: Cardiovascular;  Laterality: N/A;  . peritonal dialysis    . REMOVAL OF A DIALYSIS CATHETER N/A 07/17/2018   Procedure: REMOVAL OF A DIALYSIS CATHETER;  Surgeon: Algernon Huxley, MD;  Location: [facility];  Service: Vascular;  Laterality: N/A;     FAMILY HISTORY   Family History  Problem Relation Age of Onset  . Thyroid disease Mother   . Stroke  Father   . Cancer Father   . Diabetes Father      SOCIAL HISTORY   Social History   Tobacco Use  . Smoking status: Former Smoker    Quit date: 11/14/2009    Years since quitting: 9.6  . Smokeless tobacco: Former Systems developer    Quit date: 07/16/2009  Substance Use Topics  . Alcohol use: No  .  Drug use: Not Currently    Comment: history 10 years ago   No urine production     MEDICATIONS   Current Medication:  Current Facility-Administered Medications:  .   stroke: mapping our early stages of recovery book, , Does not apply, Once, Sharion Settler, NP .  0.9 %  sodium chloride infusion, 250 mL, Intravenous, Continuous, Wyvonnia Dusky, MD, Last Rate: 5 mL/hr at 06/22/19 0600, Rate Verify at 06/22/19 0600 .  acetaminophen (TYLENOL) tablet 650 mg, 650 mg, Per Tube, Q6H PRN **OR** acetaminophen (TYLENOL) suppository 650 mg, 650 mg, Rectal, Q6H PRN, Gerald Dexter, RPH .  aspirin chewable tablet 324 mg, 324 mg, Per Tube, Daily, Flora Lipps, MD, 324 mg at 06/21/19 0939 .  atorvastatin (LIPITOR) tablet 80 mg, 80 mg, Per Tube, q morning - 10a, Gerald Dexter, RPH, 80 mg at 06/21/19 1751 .  B-complex with vitamin C tablet 1 tablet, 1 tablet, Per Tube, Daily, Flora Lipps, MD, 1 tablet at 06/21/19 0939 .  calcitRIOL (ROCALTROL) 1 MCG/ML solution 0.25 mcg, 0.25 mcg, Per Tube, Daily, Murlean Iba, MD, 0.25 mcg at 06/21/19 0938 .  chlorhexidine gluconate (MEDLINE KIT) (PERIDEX) 0.12 % solution 15 mL, 15 mL, Mouth Rinse, BID, Alphonzo Devera, MD, 15 mL at 06/21/19 2047 .  Chlorhexidine Gluconate Cloth 2 % PADS 6 each, 6 each, Topical, Q0600, Bhutani, Manpreet S, MD, 6 each at 06/22/19 0500 .  cholestyramine (QUESTRAN) packet 4 g, 4 g, Oral, BID, Tyler Pita, MD, 4 g at 06/21/19 2159 .  clopidogrel (PLAVIX) tablet 75 mg, 75 mg, Per Tube, Daily, Ottie Glazier, MD, 75 mg at 06/21/19 0939 .  epoetin alfa (EPOGEN) injection 10,000 Units, 10,000 Units, Intravenous, Q M,W,F-HD, Murlean Iba, MD, 10,000 Units at 06/20/19 1718 .  feeding supplement (PRO-STAT SUGAR FREE 64) liquid 60 mL, 60 mL, Per Tube, TID, Flora Lipps, MD, 60 mL at 06/21/19 2157 .  feeding supplement (VITAL HIGH PROTEIN) liquid 1,000 mL, 1,000 mL, Per Tube, Q24H, Bryer Cozzolino, MD, 1,000 mL at 06/21/19 1131 .   fentaNYL (SUBLIMAZE) bolus via infusion 50 mcg, 50 mcg, Intravenous, Q15 min PRN, Darel Hong D, NP .  fentaNYL 2522mg in NS 2542m(1057mml) infusion-PREMIX, 0-400 mcg/hr, Intravenous, Continuous, Deliana Avalos, MD, Last Rate: 20 mL/hr at 06/22/19 0715, 200 mcg/hr at 06/22/19 0715 .  heparin injection 1,000-6,000 Units, 1,000-6,000 Units, CRRT, PRN, Naviah Belfield, MD .  insulin aspart (novoLOG) injection 0-9 Units, 0-9 Units, Subcutaneous, Q4H, KeeDarel Hong NP, 2 Units at 06/22/19 0450 .  ipratropium-albuterol (DUONEB) 0.5-2.5 (3) MG/3ML nebulizer solution 3 mL, 3 mL, Nebulization, Q6H PRN, WilWyvonnia DuskyD .  ipratropium-albuterol (DUONEB) 0.5-2.5 (3) MG/3ML nebulizer solution 3 mL, 3 mL, Nebulization, TID, KasFlora LippsD, 3 mL at 06/22/19 0745 .  labetalol (NORMODYNE) injection 10 mg, 10 mg, Intravenous, Q6H PRN, AleOttie GlazierD, 10 mg at 06/19/19 1133 .  levETIRAcetam (KEPPRA) IVPB 500 mg/100 mL premix, 500 mg, Intravenous, Q12H, KeeBradly BienenstockP, Stopped at 06/22/19 0540 .  MEDLINE mouth rinse, 15 mL, Mouth Rinse, 10 times per day, May Ozment,  MD, 15 mL at 06/22/19 0500 .  midazolam (VERSED) injection 2 mg, 2 mg, Intravenous, Q2H PRN, Darel Hong D, NP, 2 mg at 06/19/19 1042 .  norepinephrine (LEVOPHED) 16 mg in 226m premix infusion, 0-40 mcg/min, Intravenous, Titrated, BAwilda Bill NP, Stopped at 06/16/19 2312 .  ondansetron (ZOFRAN) tablet 4 mg, 4 mg, Per Tube, Q6H PRN **OR** ondansetron (ZOFRAN) injection 4 mg, 4 mg, Intravenous, Q6H PRN, MGerald Dexter RPH .  pantoprazole sodium (PROTONIX) 40 mg/20 mL oral suspension 40 mg, 40 mg, Per Tube, Daily, KFlora Lipps MD, 40 mg at 06/21/19 0939 .  propofol (DIPRIVAN) 1000 MG/100ML infusion, 5-80 mcg/kg/min, Intravenous, Titrated, GTyler Pita MD, Last Rate: 18.41 mL/hr at 06/22/19 0600, 25 mcg/kg/min at 06/22/19 0600 .  sevelamer carbonate (RENVELA) powder PACK 2.4 g, 2.4 g, Oral, TID WC, Lateef,  Munsoor, MD, 2.4 g at 06/21/19 1751 .  sodium chloride flush (NS) 0.9 % injection 10-40 mL, 10-40 mL, Intracatheter, Q12H, Kerry Chisolm, MD, 10 mL at 06/21/19 2200 .  sodium chloride flush (NS) 0.9 % injection 10-40 mL, 10-40 mL, Intracatheter, PRN, KMortimer Fries Shaynah Hund, MD .  vecuronium (NORCURON) injection 10 mg, 10 mg, Intravenous, Q1H PRN, KFlora Lipps MD, 10 mg at 06/19/19 1042    ALLERGIES   Patient has no known allergies.    REVIEW OF SYSTEMS    Unable to obtain due to severity of patient illness  PHYSICAL EXAMINATION   Vital Signs: Temp:  [98.2 F (36.8 C)-98.9 F (37.2 C)] 98.2 F (36.8 C) (01/08 0400) Pulse Rate:  [74-94] 84 (01/08 0600) Resp:  [14-19] 16 (01/08 0600) BP: (87-173)/(34-65) 111/37 (01/08 0600) SpO2:  [99 %-100 %] 99 % (01/08 0600) FiO2 (%):  [28 %] 28 % (01/08 0314)  GENERAL:Patient is an ill-appearing, sedated male. HEAD: Normocephalic, atraumatic.  EYES: Pupils equal, round, reactive to light.  No scleral icterus.  MOUTH: Moist mucosal membrane. NECK: Supple. No thyromegaly. No nodules. No JVD.  PULMONARY: Rhonchi on left, right clear to auscultation.  Patient maintaining SpO2 100% on vent with FiO2 28% and PEEP 5.   CARDIOVASCULAR: S1 and S2. Regular rate and rhythm. No murmurs, rubs, or gallops.  GASTROINTESTINAL: Soft, nontender, non-distended. No masses. Positive bowel sounds. No hepatosplenomegaly.  MUSCULOSKELETAL: No swelling, clubbing, or edema.  NEUROLOGIC: Patient exhibits delayed tracking with eyes.  Does not follow commands.  PERRL, corneal reflex intact bilaterally.  Cough reflex intact.   SKIN:intact,warm,dry   PERTINENT DATA    Infusions: . sodium chloride 5 mL/hr at 06/22/19 0600  . fentaNYL infusion INTRAVENOUS 200 mcg/hr (06/22/19 0715)  . levETIRAcetam Stopped (06/22/19 0540)  . norepinephrine (LEVOPHED) Adult infusion Stopped (06/16/19 2312)  . propofol (DIPRIVAN) infusion 25 mcg/kg/min (06/22/19 0600)   Scheduled  Medications: .  stroke: mapping our early stages of recovery book   Does not apply Once  . aspirin  324 mg Per Tube Daily  . atorvastatin  80 mg Per Tube q morning - 10a  . B-complex with vitamin C  1 tablet Per Tube Daily  . calcitRIOL  0.25 mcg Per Tube Daily  . chlorhexidine gluconate (MEDLINE KIT)  15 mL Mouth Rinse BID  . Chlorhexidine Gluconate Cloth  6 each Topical Q0600  . cholestyramine  4 g Oral BID  . clopidogrel  75 mg Per Tube Daily  . epoetin (EPOGEN/PROCRIT) injection  10,000 Units Intravenous Q M,W,F-HD  . feeding supplement (PRO-STAT SUGAR FREE 64)  60 mL Per Tube TID  . feeding supplement (VITAL HIGH PROTEIN)  1,000 mL Per Tube Q24H  . insulin aspart  0-9 Units Subcutaneous Q4H  . ipratropium-albuterol  3 mL Nebulization TID  . mouth rinse  15 mL Mouth Rinse 10 times per day  . pantoprazole sodium  40 mg Per Tube Daily  . sevelamer carbonate  2.4 g Oral TID WC  . sodium chloride flush  10-40 mL Intracatheter Q12H   PRN Medications: acetaminophen **OR** acetaminophen, fentaNYL, heparin, ipratropium-albuterol, labetalol, midazolam, ondansetron **OR** ondansetron (ZOFRAN) IV, sodium chloride flush, vecuronium Hemodynamic parameters:   Intake/Output: 01/07 0701 - 01/08 0700 In: 1575.7 [I.V.:1275.7; NG/GT:60; IV Piggyback:200] Out: 175 [Stool:175]  Ventilator  Settings: Vent Mode: PCV FiO2 (%):  [28 %] 28 % Set Rate:  [16 bmp] 16 bmp PEEP:  [5 cmH20] 5 cmH20 Plateau Pressure:  [14 cmH20-15 cmH20] 15 cmH20   Other Labs:  LAB RESULTS:  Basic Metabolic Panel: Recent Labs  Lab 06/17/19 0359 06/18/19 0455 06/19/19 0538 06/20/19 0445 06/21/19 0014 06/21/19 0432  NA 138 136 136 132*  --  134*  K 5.0 5.5* 4.5 4.9  --  4.5  CL 98 98 94* 93*  --  96*  CO2 '24 22 26 24  '$ --  25  GLUCOSE 190* 201* 208* 180*  --  173*  BUN 114* 149* 108* 138*  --  112*  CREATININE 8.85* 10.03* 7.32* 8.52*  --  6.73*  CALCIUM 7.6* 7.7* 7.5* 7.1*  --  7.4*  MG  --   --   --  2.1   --   --   PHOS  --   --   --  9.0* 7.4* 7.1*   Liver Function Tests: No results for input(s): AST, ALT, ALKPHOS, BILITOT, PROT, ALBUMIN in the last 168 hours. No results for input(s): LIPASE, AMYLASE in the last 168 hours. No results for input(s): AMMONIA in the last 168 hours. CBC: Recent Labs  Lab 06/17/19 0359 06/18/19 0455 06/19/19 0538 06/20/19 0445 06/21/19 0432 06/21/19 1856  WBC 9.6 7.8 6.0 8.7 7.3  --   NEUTROABS 7.1 5.5 4.1 5.9 5.2  --   HGB 7.4* 7.4* 8.6* 7.5* 7.4* 7.5*  HCT 24.8* 22.7* 27.8* 24.4* 23.8* 24.3*  MCV 100.8* 95.4 99.6 99.6 99.2  --   PLT 355 317 301 327 286  --    Cardiac Enzymes: No results for input(s): CKTOTAL, CKMB, CKMBINDEX, TROPONINI in the last 168 hours. BNP: Invalid input(s): POCBNP CBG: Recent Labs  Lab 06/21/19 1628 06/21/19 1943 06/22/19 0032 06/22/19 0103 06/22/19 0323  GLUCAP 139* 107* 144* 159* 163*     IMAGING RESULTS:  Imaging: DG Abd 1 View  Result Date: 06/21/2019 CLINICAL DATA:  49 year old male enteric tube placement. EXAM: ABDOMEN - 1 VIEW COMPARISON:  Portable abdomen 06/01/2019 and earlier. FINDINGS: Portable AP semi upright view at 0954 hours. Enteric tube terminates in the distal gastric body, side hole the level of the gastric body. Stable visible lung bases. Negative visible bowel gas pattern. No acute osseous abnormality identified. IMPRESSION: Enteric tube in the stomach, side hole at the level of the gastric body. Electronically Signed   By: Genevie Ann M.D.   On: 06/21/2019 10:29   '@PROBHOSP'$ @   ASSESSMENT AND PLAN    -Multidisciplinary rounds held today  Acute Hypoxic Respiratory Failure due to acute left lobe pneumonia and respiratory depression following CVA -continue Full MV support- requiring FiO2 28%, PEEP 5 -continue Bronchodilator Therapy with duoneb -Wean Fio2 and PEEP as tolerated -will perform SAT/SBT when respiratory parameters  are met  S/P CVA - Neurologic deficit.  Patient is exhibiting  delayed tracking with eyes, but continues to not follow commands. - MRI on 12/30 consistent with multiple infarcts in centrum semiovale, corona radiata and periventricular white matter. - Continue aspirin, atorvastatin and clopidogrel - Patient exhibits diaphoresis and over-riding vent when sedation decreased.  Continue fentanyl and propofol.   ESRD - Under care of nephrology- appreciate their input - Potassium 5.4 today- will undergo hemodialysis - Patient does not make urine  Goals of Care - Multiple attempts to contact family within the past week.  Felipa Evener they are in [place], but unable to get in touch. - Will attempt to reach family again today. - Need to discuss goals of care including Trach placement, PEG placement and comfort care  Anemia - Hgb 7.4 today.   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 - Continue epoetin alfa - No transfusion  Necessary at this point.   GI/Nutrition GI PROPHYLAXIS as indicated DIET-->Continue TF's as tolerated Constipation protocol as indicated  Diabetes Mellitus Type 2 - ICU hypoglycemic\Hyperglycemia protocol -check FSBS per protocol - Continue Insulin Aspart sliding scale   ELECTROLYTES -follow labs as needed -replace as needed -pharmacy consultation   DVT/GI PRX ordered -SCDs  - Heparin TRANSFUSIONS AS NEEDED ASSESS the need for LABS as needed   PALLIATIVE CARE CONSULTED FAMILY NOT AVAILABLE TO TALK  Patient remains critically ill.  Prognosis is guarded.   Critical Care Time devoted to patient care services described in this note is 34 minutes.   Overall, patient is critically ill, prognosis is guarded.  Patient with Multiorgan failure and at high risk for cardiac arrest and death.    Corrin Parker, M.D.  [name]  Medical Director [facility] Director [facility] Cardio-Pulmonary Department

## 2019-06-22 NOTE — Progress Notes (Signed)
Post HD Tx Note Pt continues to receive propofol 41mcg/kg/min, Fentanyl 2110mcg/hr and 0.9% NS IV Maintaince fluids . Pt responds too pain and voice. BP stable throughout dialysis. Pt continues to be intubated on FIO2 28. AVF WDL post dialysis    06/22/19 1815  Hand-Off documentation  Report given to (Full Name) Loman Chroman RN   Report received from (Full Name) Newt Minion RN   Vital Signs  Temp 98 F (36.7 C)  Temp Source Axillary  Pulse Rate 96  Pulse Rate Source Monitor  Resp 15  BP (!) 167/45  BP Location Right Leg  BP Method Automatic  Patient Position (if appropriate) Lying  Oxygen Therapy  SpO2 100 %  O2 Device Ventilator  FiO2 (%) 28 %  End Tidal CO2 (EtCO2) 39  Pain Assessment  Pain Scale Faces  Pain Score 0  Post-Hemodialysis Assessment  Rinseback Volume (mL) 250 mL  KECN 77 V  Dialyzer Clearance Lightly streaked  Duration of HD Treatment -hour(s) 3.5 hour(s)  Hemodialysis Intake (mL) 500 mL  UF Total -Machine (mL) 2003 mL  Net UF (mL) 1503 mL  Tolerated HD Treatment Yes  AVG/AVF Arterial Site Held (minutes) 7 minutes  AVG/AVF Venous Site Held (minutes) 6 minutes  Fistula / Graft Left Upper arm Arteriovenous fistula  No Placement Date or Time found.   Placed prior to admission: Yes  Orientation: Left  Access Location: Upper arm  Access Type: Arteriovenous fistula  Site Condition No complications  Fistula / Graft Assessment Present;Thrill;Bruit  Status Deaccessed  Needle Size 15  Drainage Description None

## 2019-06-22 NOTE — Progress Notes (Signed)
Post HD Tx Assessment   06/22/19 1755  Neurological  Level of Consciousness Responds to Voice  Orientation Level Intubated/Tracheostomy - Unable to assess  Respiratory  Respiratory Pattern Regular;Unlabored  Chest Assessment Chest expansion symmetrical  Bilateral Breath Sounds Diminished  Cardiac  Pulse Regular  Heart Sounds S1, S2  Jugular Venous Distention (JVD) No  ECG Monitor Yes  Cardiac Rhythm NSR  Antiarrhythmic device No  Vascular  R Radial Pulse +2  L Radial Pulse +2  R Dorsalis Pedis Pulse +2  L Dorsalis Pedis Pulse +2  Generalized Edema Other (Comment) (non pitting)  Integumentary  Integumentary (WDL) X  Skin Color Appropriate for ethnicity  Skin Condition Dry  Skin Integrity Ecchymosis;Excoriated (scratch marks);Skin tear  Ecchymosis Location Other (Comment) (Generalized)  Ecchymosis Location Orientation Circumferential  Musculoskeletal  Musculoskeletal (WDL) X  Generalized Weakness Yes  Gastrointestinal  Bowel Sounds Assessment Active  Psychosocial  Psychosocial (WDL) X  Patient Behaviors Not interactive  Needs Expressed  (unable to assess)

## 2019-06-22 NOTE — Progress Notes (Signed)
PALLIATIVE NOTE:  Patient remains intubated with poor prognosis s/p CVA and respiratory failure. He is unable to wean from Vent. Being followed by Nephrology as well.  I have made multiple attempts daily to reach family on all listed numbers (mother, daughter, and sister). All attempts have been unsuccessful.   It has been reported family is in [place] but was expected to return on yesterday (06/20/18) but this has been unable to confirm if they are back or not.   No weekend Palliative coverage. We will continue to engage and follow up with family next week for crucial goals of care discussion regarding patient's poor prognosis, comfort care, or continued aggressive care (trach/peg).   Alda Lea, AGPCNP-BC Palliative Medicine Team   NO CHARGE

## 2019-06-22 NOTE — Progress Notes (Signed)
[facility]  ROUNDING NOTE   Subjective:  Patient seen at bedside in the CCU. Apparently did have some blood loss after removal of temporary catheter in groin. Patient noted to be diaphoretic today but was off sedation.  Objective:  Vital signs in last 24 hours:  Temp:  [98.2 F (36.8 C)-98.9 F (37.2 C)] 98.9 F (37.2 C) (01/08 0810) Pulse Rate:  [74-94] 84 (01/08 0600) Resp:  [15-19] 16 (01/08 0600) BP: (92-173)/(36-65) 111/37 (01/08 0600) SpO2:  [99 %-100 %] 99 % (01/08 0600) FiO2 (%):  [28 %] 28 % (01/08 0810)  Weight change:  Filed Weights   06/07/19 0248 06/20/19 1600 06/20/19 1915  Weight: 122.7 kg (!) 136.2 kg 134 kg    Intake/Output: I/O last 3 completed shifts: In: 3352.2 [I.V.:2004.4; Other:40; NG/GT:1008; IV Piggyback:299.8] Out: 2284 [Other:2059; Stool:225]   Intake/Output this shift:  No intake/output data recorded.  Physical Exam: General: Critically ill   HEENT: ETT  Lungs:  Vent assisted, bilateral rhonchi  Heart: Regular, 3/6 systolic murmur  Abdomen:  Soft, nontender, obese  Extremities: trace peripheral edema.  Neurologic: Intubated, currently off sedation  Skin: Diaphoresis noted  Access: Left Arm AVF Dr. Lucky Cowboy 6/56/81    Basic Metabolic Panel: Recent Labs  Lab 06/18/19 0455 06/19/19 2751 06/20/19 0445 06/21/19 0014 06/21/19 0432 06/22/19 0801  NA 136 136 132*  --  134* 131*  K 5.5* 4.5 4.9  --  4.5 5.4*  CL 98 94* 93*  --  96* 94*  CO2 '22 26 24  '$ --  25 23  GLUCOSE 201* 208* 180*  --  173* 170*  BUN 149* 108* 138*  --  112* 132*  CREATININE 10.03* 7.32* 8.52*  --  6.73* 7.93*  CALCIUM 7.7* 7.5* 7.1*  --  7.4* 7.1*  MG  --   --  2.1  --   --   --   PHOS  --   --  9.0* 7.4* 7.1* 8.0*    Liver Function Tests: Recent Labs  Lab 06/22/19 0801  ALBUMIN 1.9*   No results for input(s): LIPASE, AMYLASE in the last 168 hours. No results for input(s): AMMONIA in the last 168 hours.  CBC: Recent Labs  Lab 06/17/19 0359  06/18/19 0455 06/19/19 0538 06/20/19 0445 06/21/19 0432 06/21/19 1856 06/22/19 0801  WBC 9.6 7.8 6.0 8.7 7.3  --  7.7  NEUTROABS 7.1 5.5 4.1 5.9 5.2  --   --   HGB 7.4* 7.4* 8.6* 7.5* 7.4* 7.5* 6.8*  HCT 24.8* 22.7* 27.8* 24.4* 23.8* 24.3* 21.6*  MCV 100.8* 95.4 99.6 99.6 99.2  --  98.2  PLT 355 317 301 327 286  --  273    Cardiac Enzymes: No results for input(s): CKTOTAL, CKMB, CKMBINDEX, TROPONINI in the last 168 hours.  BNP: Invalid input(s): POCBNP  CBG: Recent Labs  Lab 06/21/19 1628 06/21/19 1943 06/22/19 0032 06/22/19 0103 06/22/19 0323  GLUCAP 139* 107* 144* 159* 163*    Microbiology: Results for orders placed or performed during the hospital encounter of 05/22/2019  Respiratory Panel by RT PCR (Flu A&B, Covid) - Nasopharyngeal Swab     Status: None   Collection Time: 06/13/2019  2:35 AM   Specimen: Nasopharyngeal Swab  Result Value Ref Range Status   SARS Coronavirus 2 by RT PCR NEGATIVE NEGATIVE Final    Comment: (NOTE) SARS-CoV-2 target nucleic acids are NOT DETECTED. The SARS-CoV-2 RNA is generally detectable in upper respiratoy specimens during the acute phase of infection. The  lowest concentration of SARS-CoV-2 viral copies this assay can detect is 131 copies/mL. A negative result does not preclude SARS-Cov-2 infection and should not be used as the sole basis for treatment or other patient management decisions. A negative result may occur with  improper specimen collection/handling, submission of specimen other than nasopharyngeal swab, presence of viral mutation(s) within the areas targeted by this assay, and inadequate number of viral copies (<131 copies/mL). A negative result must be combined with clinical observations, patient history, and epidemiological information. The expected result is Negative. Fact Sheet for Patients:  PinkCheek.be Fact Sheet for Healthcare Providers:   GravelBags.it This test is not yet ap proved or cleared by the [place] FDA and  has been authorized for detection and/or diagnosis of SARS-CoV-2 by FDA under an Emergency Use Authorization (EUA). This EUA will remain  in effect (meaning this test can be used) for the duration of the COVID-19 declaration under Section 564(b)(1) of the Act, 21 U.S.C. section 360bbb-3(b)(1), unless the authorization is terminated or revoked sooner.    Influenza A by PCR NEGATIVE NEGATIVE Final   Influenza B by PCR NEGATIVE NEGATIVE Final    Comment: (NOTE) The Xpert Xpress SARS-CoV-2/FLU/RSV assay is intended as an aid in  the diagnosis of influenza from Nasopharyngeal swab specimens and  should not be used as a sole basis for treatment. Nasal washings and  aspirates are unacceptable for Xpert Xpress SARS-CoV-2/FLU/RSV  testing. Fact Sheet for Patients: PinkCheek.be Fact Sheet for Healthcare Providers: GravelBags.it This test is not yet approved or cleared by the [place] FDA and  has been authorized for detection and/or diagnosis of SARS-CoV-2 by  FDA under an Emergency Use Authorization (EUA). This EUA will remain  in effect (meaning this test can be used) for the duration of the  Covid-19 declaration under Section 564(b)(1) of the Act, 21  U.S.C. section 360bbb-3(b)(1), unless the authorization is  terminated or revoked. Performed at [facility], [address] [place], [place] [address]   Culture, blood (routine x 2)     Status: None   Collection Time: 05/15/2019  2:36 AM   Specimen: BLOOD  Result Value Ref Range Status   Specimen Description BLOOD LEFT ANTECUBITAL  Final   Special Requests   Final    BOTTLES DRAWN AEROBIC AND ANAEROBIC Blood Culture adequate volume   Culture   Final    NO GROWTH 5 DAYS Performed at [facility], [address], [place], [place] [address]     Report Status 06/07/2019 FINAL  Final  Culture, blood (routine x 2)     Status: None   Collection Time: 05/31/2019  2:36 AM   Specimen: BLOOD  Result Value Ref Range Status   Specimen Description BLOOD RIGHT FOREARM  Final   Special Requests   Final    BOTTLES DRAWN AEROBIC AND ANAEROBIC Blood Culture adequate volume   Culture   Final    NO GROWTH 5 DAYS Performed at [facility], [address] [place],  [address]    Report Status 06/07/2019 FINAL  Final  Culture, blood (routine x 2)     Status: None   Collection Time: 05/21/2019  3:10 PM   Specimen: BLOOD  Result Value Ref Range Status   Specimen Description BLOOD A-LINE  Final   Special Requests   Final    BOTTLES DRAWN AEROBIC AND ANAEROBIC Blood Culture results may not be optimal due to an excessive volume of blood received in culture bottles  Culture   Final    NO GROWTH 5 DAYS Performed at [facility], [address] [place], [place] [address]    Report Status 06/07/2019 FINAL  Final  Culture, blood (routine x 2)     Status: None   Collection Time: 06/08/2019  3:10 PM   Specimen: BLOOD  Result Value Ref Range Status   Specimen Description BLOOD A-LINE  Final   Special Requests   Final    BOTTLES DRAWN AEROBIC AND ANAEROBIC Blood Culture results may not be optimal due to an excessive volume of blood received in culture bottles   Culture   Final    NO GROWTH 5 DAYS Performed at [facility], [address] [place], [place] [address]    Report Status 06/07/2019 FINAL  Final  MRSA PCR Screening     Status: None   Collection Time: 06/03/19  5:45 PM   Specimen: Nasopharyngeal  Result Value Ref Range Status   MRSA by PCR NEGATIVE NEGATIVE Final    Comment:        The GeneXpert MRSA Assay (FDA approved for NASAL specimens only), is one component of a comprehensive MRSA colonization surveillance program. It is not intended to diagnose MRSA infection nor to guide or monitor treatment  for MRSA infections. Performed at [facility], [address], [place], [place] [address]   Urine Culture     Status: None   Collection Time: 06/03/19  6:20 PM   Specimen: Urine, Catheterized  Result Value Ref Range Status   Specimen Description   Final    URINE, CATHETERIZED Performed at [facility], [address] [place], [place] [address]    Special Requests   Final    Immunocompromised Performed at [facility], [address] [place], [place] [address]    Culture   Final    NO GROWTH Performed at [facility], [place] [address] [place], [place] [address]    Report Status 06/05/2019 FINAL  Final  Cath Tip Culture     Status: None   Collection Time: 06/06/2019  4:32 PM   Specimen: Catheter Tip; Other  Result Value Ref Range Status   Specimen Description   Final    CATH TIP Performed at [facility], [address], [place], [place] [address]    Special Requests   Final    NONE Performed at [facility], [address], [place], [place] [address]    Culture   Final    NO GROWTH 2 DAYS Performed at [facility], [place] [address], [place], [place] [address]    Report Status 06/07/2019 FINAL  Final  Culture, respiratory (non-expectorated)     Status: None   Collection Time: 06/06/19 10:22 AM   Specimen: Tracheal Aspirate; Respiratory  Result Value Ref Range Status   Specimen Description   Final    TRACHEAL ASPIRATE Performed at [facility], [address] [place], [place] [address]    Special Requests   Final    NONE Performed at [facility], [address] [place], [place] [address]    Gram Stain   Final    RARE WBC PRESENT, PREDOMINANTLY PMN NO ORGANISMS SEEN Performed at [facility], [place] [address] [place],  [address]    Culture   Final    RARE CANDIDA ALBICANS RARE FUNGUS (MOLD) ISOLATED, PROBABLE CONTAMINANT/COLONIZER (SAPROPHYTE). CONTACT MICROBIOLOGY IF  FURTHER IDENTIFICATION REQUIRED [phone].    Report  Status 06/09/2019 FINAL  Final  CULTURE, BLOOD (ROUTINE X 2) w Reflex to ID Panel     Status: None   Collection Time: 06/11/19  1:09 PM   Specimen: BLOOD  Result Value Ref Range Status   Specimen Description BLOOD A-LINE DRAW  Final   Special Requests   Final    BOTTLES DRAWN AEROBIC AND ANAEROBIC Blood Culture adequate volume   Culture   Final    NO GROWTH 5 DAYS Performed at [facility], [address] [place], [place] [address]    Report Status 06/16/2019 FINAL  Final  CULTURE, BLOOD (ROUTINE X 2) w Reflex to ID Panel     Status: None   Collection Time: 06/11/19  1:10 PM   Specimen: BLOOD  Result Value Ref Range Status   Specimen Description BLOOD A-LINE DRAW  Final   Special Requests   Final    BOTTLES DRAWN AEROBIC AND ANAEROBIC Blood Culture adequate volume   Culture   Final    NO GROWTH 5 DAYS Performed at [facility], [address] [place], [place] [address]    Report Status 06/16/2019 FINAL  Final  Culture, respiratory (non-expectorated)     Status: None   Collection Time: 06/11/19  6:04 PM   Specimen: Tracheal Aspirate; Respiratory  Result Value Ref Range Status   Specimen Description   Final    TRACHEAL ASPIRATE Performed at [facility], [address], [place], [place] [address]    Special Requests   Final    NONE Performed at [facility], [address] [place], [place] [address]    Gram Stain   Final    MODERATE WBC PRESENT, PREDOMINANTLY PMN FEW YEAST Performed at [facility], [place] [address], [place], [place] [address]    Culture FEW CANDIDA ALBICANS  Final   Report Status 06/14/2019 FINAL  Final    Coagulation Studies: No results for input(s): LABPROT, INR in the last 72 hours.  Urinalysis: No results for input(s): COLORURINE, LABSPEC, PHURINE, GLUCOSEU, HGBUR, BILIRUBINUR, KETONESUR, PROTEINUR, UROBILINOGEN, NITRITE, LEUKOCYTESUR in the  last 72 hours.  Invalid input(s): APPERANCEUR    Imaging: DG Abd 1 View  Result Date: 06/21/2019 CLINICAL DATA:  49 year old male enteric tube placement. EXAM: ABDOMEN - 1 VIEW COMPARISON:  Portable abdomen 06/07/2019 and earlier. FINDINGS: Portable AP semi upright view at 0954 hours. Enteric tube terminates in the distal gastric body, side hole the level of the gastric body. Stable visible lung bases. Negative visible bowel gas pattern. No acute osseous abnormality identified. IMPRESSION: Enteric tube in the stomach, side hole at the level of the gastric body. Electronically Signed   By: Genevie Ann M.D.   On: 06/21/2019 10:29     Medications:   . sodium chloride 5 mL/hr at 06/22/19 0600  . fentaNYL infusion INTRAVENOUS 200 mcg/hr (06/22/19 0715)  . levETIRAcetam Stopped (06/22/19 0540)  . norepinephrine (LEVOPHED) Adult infusion Stopped (06/16/19 2312)  . propofol (DIPRIVAN) infusion 25 mcg/kg/min (06/22/19 0932)   .  stroke: mapping our early stages of recovery book   Does not apply Once  . aspirin  324 mg Per Tube Daily  . atorvastatin  80 mg Per Tube q morning - 10a  . B-complex with vitamin C  1 tablet Per Tube Daily  . calcitRIOL  0.25 mcg Per Tube Daily  . chlorhexidine gluconate (MEDLINE KIT)  15 mL Mouth Rinse BID  . Chlorhexidine Gluconate Cloth  6 each Topical Q0600  .  cholestyramine  4 g Oral BID  . clopidogrel  75 mg Per Tube Daily  . epoetin (EPOGEN/PROCRIT) injection  10,000 Units Intravenous Q M,W,F-HD  . feeding supplement (PRO-STAT SUGAR FREE 64)  60 mL Per Tube TID  . feeding supplement (VITAL HIGH PROTEIN)  1,000 mL Per Tube Q24H  . insulin aspart  0-9 Units Subcutaneous Q4H  . ipratropium-albuterol  3 mL Nebulization TID  . mouth rinse  15 mL Mouth Rinse 10 times per day  . pantoprazole sodium  40 mg Per Tube Daily  . sevelamer carbonate  2.4 g Oral TID WC   acetaminophen **OR** acetaminophen, fentaNYL, heparin, ipratropium-albuterol, labetalol, midazolam,  ondansetron **OR** ondansetron (ZOFRAN) IV, vecuronium  Assessment/ Plan:  Jake Gomez is a 49 y.o. Hispanic male with end stage renal disease, diabetes mellitus type I, hypertension, sleep apnea who was admitted to [facility] on 05/28/2019 for Hyperkalemia [E87.5] CVA (cerebral vascular accident) ([place]) [I63.9] Stroke ([place]) [I63.9] ESRD (end stage renal disease) on dialysis ([place]) [N18.6, Z99.2] Chest pain [R07.9]  [facility] [address] RIJ 120kg  1. End stage renal disease on hemodialysis. Requiring CRRT from 12/21 to 12/24 -Patient seen at bedside.  Due for hemodialysis later today.  Orders have been prepared.  2. Acute Resp failure - Pneumonia: with acute respiratory failure requiring intubation and mechanical ventilation.    -Patient still requiring significant ventilatory support.  Palliative care team awaiting family discussion regarding tracheostomy placement.  3. Anemia of chronic kidney disease: macrocytic.  Lab Results  Component Value Date   HGB 6.8 (L) 06/22/2019   -Hemoglobin down to 6.8.  Recommend blood transfusion but defer to primary team.  Maintain the patient on Epogen otherwise.  4. Secondary Hyperparathyroidism with hypocalcemia and hyperphosphatemia.   Lab Results  Component Value Date   CALCIUM 7.1 (L) 06/22/2019   PHOS 8.0 (H) 06/22/2019  Phosphorus increased to 8.  Should come down a bit with dialysis.  He is maintained on Renvela powder as well.  Dose was increased earlier in the week.  5. Acute ischemic stroke Repeat MRI done, remains encephalopathic     LOS: 20 Taviana Westergren 1/8/202111:28 AM

## 2019-06-22 NOTE — Progress Notes (Signed)
HD Tx Completed    06/22/19 1800  Vital Signs  Pulse Rate 93  Resp 15  BP (!) 175/56  Oxygen Therapy  SpO2 100 %  O2 Device Ventilator  FiO2 (%) 28 %  End Tidal CO2 (EtCO2) 39  Pain Assessment  Pain Scale Faces  Pain Score 0  During Hemodialysis Assessment  Blood Flow Rate (mL/min) 400 mL/min  Arterial Pressure (mmHg) -180 mmHg  Venous Pressure (mmHg) 180 mmHg  Transmembrane Pressure (mmHg) 50 mmHg  Ultrafiltration Rate (mL/min) 570 mL/min  Dialysate Flow Rate (mL/min) 800 ml/min  Conductivity: Machine  13.9  HD Safety Checks Performed Yes  Intra-Hemodialysis Comments Tx completed;Tolerated well

## 2019-06-22 NOTE — Progress Notes (Signed)
HD Tx Started    06/22/19 1415  Vital Signs  Temp 98.7 F (37.1 C)  Temp Source Oral  Pulse Rate 93  Pulse Rate Source Monitor  Resp 18  BP (!) 163/50  BP Location Right Leg  BP Method Automatic  Patient Position (if appropriate) Lying  Oxygen Therapy  SpO2 100 %  O2 Device Ventilator  FiO2 (%) 28 %  End Tidal CO2 (EtCO2) 36  Pain Assessment  Pain Scale Faces  Pain Score 0  During Hemodialysis Assessment  Blood Flow Rate (mL/min) 400 mL/min  Arterial Pressure (mmHg) -180 mmHg  Venous Pressure (mmHg) 180 mmHg  Transmembrane Pressure (mmHg) 50 mmHg  Ultrafiltration Rate (mL/min) 570 mL/min  Dialysate Flow Rate (mL/min) 600 ml/min  Conductivity: Machine  14  HD Safety Checks Performed Yes  Dialysis Fluid Bolus Normal Saline  Bolus Amount (mL) 250 mL  Intra-Hemodialysis Comments Tx initiated

## 2019-06-22 NOTE — Progress Notes (Signed)
Pre-HD Assessment   06/22/19 1410  Neurological  Level of Consciousness Responds to Voice  Orientation Level Intubated/Tracheostomy - Unable to assess  Respiratory  Respiratory Pattern Regular;Unlabored  Chest Assessment Chest expansion symmetrical  Bilateral Breath Sounds Diminished  Cardiac  Pulse Regular  Heart Sounds S1, S2  Jugular Venous Distention (JVD) No  ECG Monitor Yes  Cardiac Rhythm NSR  Antiarrhythmic device No  Vascular  R Radial Pulse +2  L Radial Pulse +2  R Dorsalis Pedis Pulse +2  L Dorsalis Pedis Pulse +2  Generalized Edema Other (Comment) (non pitting)  Integumentary  Integumentary (WDL) X  Skin Color Appropriate for ethnicity  Skin Condition Dry  Skin Integrity Ecchymosis;Excoriated (scratch marks);Skin tear  Ecchymosis Location Other (Comment) (Generalized)  Ecchymosis Location Orientation Circumferential  Musculoskeletal  Musculoskeletal (WDL) X  Generalized Weakness Yes  Gastrointestinal  Bowel Sounds Assessment Active  Last BM Date 06/22/19  Psychosocial  Psychosocial (WDL) X  Patient Behaviors Not interactive  Needs Expressed  (unable to assess)

## 2019-06-23 LAB — RENAL FUNCTION PANEL
Albumin: 2.1 g/dL — ABNORMAL LOW (ref 3.5–5.0)
Anion gap: 13 (ref 5–15)
BUN: 76 mg/dL — ABNORMAL HIGH (ref 6–20)
CO2: 26 mmol/L (ref 22–32)
Calcium: 7.8 mg/dL — ABNORMAL LOW (ref 8.9–10.3)
Chloride: 94 mmol/L — ABNORMAL LOW (ref 98–111)
Creatinine, Ser: 5.19 mg/dL — ABNORMAL HIGH (ref 0.61–1.24)
GFR calc Af Amer: 14 mL/min — ABNORMAL LOW (ref >60)
GFR calc non Af Amer: 12 mL/min — ABNORMAL LOW (ref >60)
Glucose, Bld: 143 mg/dL — ABNORMAL HIGH (ref 70–99)
Phosphorus: 5.3 mg/dL — ABNORMAL HIGH (ref 2.5–4.6)
Potassium: 4.3 mmol/L (ref 3.5–5.1)
Sodium: 133 mmol/L — ABNORMAL LOW (ref 135–145)

## 2019-06-23 LAB — CBC
HCT: 22 % — ABNORMAL LOW (ref 39.0–52.0)
Hemoglobin: 6.8 g/dL — ABNORMAL LOW (ref 13.0–17.0)
MCH: 30.8 pg (ref 26.0–34.0)
MCHC: 30.9 g/dL (ref 30.0–36.0)
MCV: 99.5 fL (ref 80.0–100.0)
Platelets: 278 10*3/uL (ref 150–400)
RBC: 2.21 MIL/uL — ABNORMAL LOW (ref 4.22–5.81)
RDW: 14.6 % (ref 11.5–15.5)
WBC: 8 10*3/uL (ref 4.0–10.5)
nRBC: 0 % (ref 0.0–0.2)

## 2019-06-23 LAB — PREPARE RBC (CROSSMATCH)

## 2019-06-23 LAB — GLUCOSE, CAPILLARY
Glucose-Capillary: 117 mg/dL — ABNORMAL HIGH (ref 70–99)
Glucose-Capillary: 118 mg/dL — ABNORMAL HIGH (ref 70–99)
Glucose-Capillary: 119 mg/dL — ABNORMAL HIGH (ref 70–99)
Glucose-Capillary: 130 mg/dL — ABNORMAL HIGH (ref 70–99)
Glucose-Capillary: 134 mg/dL — ABNORMAL HIGH (ref 70–99)
Glucose-Capillary: 135 mg/dL — ABNORMAL HIGH (ref 70–99)
Glucose-Capillary: 149 mg/dL — ABNORMAL HIGH (ref 70–99)

## 2019-06-23 MED ORDER — CHLORHEXIDINE GLUCONATE CLOTH 2 % EX PADS
6.0000 | MEDICATED_PAD | Freq: Two times a day (BID) | CUTANEOUS | Status: DC
  Administered 2019-06-23 – 2019-06-24 (×3): 6 via TOPICAL

## 2019-06-23 MED ORDER — SODIUM CHLORIDE 0.9% IV SOLUTION: Freq: Once | INTRAVENOUS | Status: AC

## 2019-06-23 NOTE — Progress Notes (Signed)
[facility]  ROUNDING NOTE   Subjective:   Hemodialysis treatment yesterday. UF of 1.5 liters.   Objective:  Vital signs in last 24 hours:  Temp:  [98 F (36.7 C)-99.2 F (37.3 C)] 98.5 F (36.9 C) (01/09 0800) Pulse Rate:  [78-110] 79 (01/09 1000) Resp:  [14-20] 16 (01/09 1000) BP: (100-196)/(29-62) 106/55 (01/09 1000) SpO2:  [98 %-100 %] 100 % (01/09 1141) FiO2 (%):  [28 %] 28 % (01/09 1141) Weight:  [133.2 kg] 133.2 kg (01/09 0445)  Weight change:  Filed Weights   06/20/19 1600 06/20/19 1915 06/23/19 0445  Weight: (!) 136.2 kg 134 kg 133.2 kg    Intake/Output: I/O last 3 completed shifts: In: 3595.2 [I.V.:1565.2; Other:40; NG/GT:1690; IV Piggyback:300] Out: 1678 [Other:1503; Stool:175]   Intake/Output this shift:  Total I/O In: 132.1 [I.V.:132.1] Out: 0   Physical Exam: General: Critically ill   HEENT: ETT  Lungs:  Pressure control FiO2 28%  bilateral rhonchi  Heart: Regular, 3/6 systolic murmur  Abdomen:  Soft, nontender, obese  Extremities: trace peripheral edema.  Neurologic: Intubated, currently off sedation  Skin: No lesions  Access: Left Arm AVF Dr. Lucky Cowboy 2/29/79    Basic Metabolic Panel: Recent Labs  Lab 06/19/19 8921 06/20/19 0445 06/21/19 0014 06/21/19 0432 06/22/19 0801 06/23/19 0535  NA 136 132*  --  134* 131* 133*  K 4.5 4.9  --  4.5 5.4* 4.3  CL 94* 93*  --  96* 94* 94*  CO2 26 24  --  '25 23 26  '$ GLUCOSE 208* 180*  --  173* 170* 143*  BUN 108* 138*  --  112* 132* 76*  CREATININE 7.32* 8.52*  --  6.73* 7.93* 5.19*  CALCIUM 7.5* 7.1*  --  7.4* 7.1* 7.8*  MG  --  2.1  --   --   --   --   PHOS  --  9.0* 7.4* 7.1* 8.0* 5.3*    Liver Function Tests: Recent Labs  Lab 06/22/19 0801 06/23/19 0535  ALBUMIN 1.9* 2.1*   No results for input(s): LIPASE, AMYLASE in the last 168 hours. No results for input(s): AMMONIA in the last 168 hours.  CBC: Recent Labs  Lab 06/17/19 0359 06/18/19 0455 06/19/19 0538 06/20/19 0445  06/21/19 0432 06/21/19 1856 06/22/19 0801 06/23/19 0535  WBC 9.6 7.8 6.0 8.7 7.3  --  7.7 8.0  NEUTROABS 7.1 5.5 4.1 5.9 5.2  --   --   --   HGB 7.4* 7.4* 8.6* 7.5* 7.4* 7.5* 6.8* 6.8*  HCT 24.8* 22.7* 27.8* 24.4* 23.8* 24.3* 21.6* 22.0*  MCV 100.8* 95.4 99.6 99.6 99.2  --  98.2 99.5  PLT 355 317 301 327 286  --  273 278    Cardiac Enzymes: No results for input(s): CKTOTAL, CKMB, CKMBINDEX, TROPONINI in the last 168 hours.  BNP: Invalid input(s): POCBNP  CBG: Recent Labs  Lab 06/22/19 1950 06/22/19 2326 06/23/19 0324 06/23/19 0745 06/23/19 1146  GLUCAP 141* 170* 130* 149* 134*    Microbiology: Results for orders placed or performed during the hospital encounter of 05/21/2019  Respiratory Panel by RT PCR (Flu A&B, Covid) - Nasopharyngeal Swab     Status: None   Collection Time: 05/21/2019  2:35 AM   Specimen: Nasopharyngeal Swab  Result Value Ref Range Status   SARS Coronavirus 2 by RT PCR NEGATIVE NEGATIVE Final    Comment: (NOTE) SARS-CoV-2 target nucleic acids are NOT DETECTED. The SARS-CoV-2 RNA is generally detectable in upper respiratoy specimens during the  acute phase of infection. The lowest concentration of SARS-CoV-2 viral copies this assay can detect is 131 copies/mL. A negative result does not preclude SARS-Cov-2 infection and should not be used as the sole basis for treatment or other patient management decisions. A negative result may occur with  improper specimen collection/handling, submission of specimen other than nasopharyngeal swab, presence of viral mutation(s) within the areas targeted by this assay, and inadequate number of viral copies (<131 copies/mL). A negative result must be combined with clinical observations, patient history, and epidemiological information. The expected result is Negative. Fact Sheet for Patients:  PinkCheek.be Fact Sheet for Healthcare Providers:   GravelBags.it This test is not yet ap proved or cleared by the [place] FDA and  has been authorized for detection and/or diagnosis of SARS-CoV-2 by FDA under an Emergency Use Authorization (EUA). This EUA will remain  in effect (meaning this test can be used) for the duration of the COVID-19 declaration under Section 564(b)(1) of the Act, 21 U.S.C. section 360bbb-3(b)(1), unless the authorization is terminated or revoked sooner.    Influenza A by PCR NEGATIVE NEGATIVE Final   Influenza B by PCR NEGATIVE NEGATIVE Final    Comment: (NOTE) The Xpert Xpress SARS-CoV-2/FLU/RSV assay is intended as an aid in  the diagnosis of influenza from Nasopharyngeal swab specimens and  should not be used as a sole basis for treatment. Nasal washings and  aspirates are unacceptable for Xpert Xpress SARS-CoV-2/FLU/RSV  testing. Fact Sheet for Patients: PinkCheek.be Fact Sheet for Healthcare Providers: GravelBags.it This test is not yet approved or cleared by the [place] FDA and  has been authorized for detection and/or diagnosis of SARS-CoV-2 by  FDA under an Emergency Use Authorization (EUA). This EUA will remain  in effect (meaning this test can be used) for the duration of the  Covid-19 declaration under Section 564(b)(1) of the Act, 21  U.S.C. section 360bbb-3(b)(1), unless the authorization is  terminated or revoked. Performed at [facility], [address] [place], [place] [address]   Culture, blood (routine x 2)     Status: None   Collection Time: 06/09/2019  2:36 AM   Specimen: BLOOD  Result Value Ref Range Status   Specimen Description BLOOD LEFT ANTECUBITAL  Final   Special Requests   Final    BOTTLES DRAWN AEROBIC AND ANAEROBIC Blood Culture adequate volume   Culture   Final    NO GROWTH 5 DAYS Performed at [facility], [address] [place], [place] [address]     Report Status 06/07/2019 FINAL  Final  Culture, blood (routine x 2)     Status: None   Collection Time: 06/01/2019  2:36 AM   Specimen: BLOOD  Result Value Ref Range Status   Specimen Description BLOOD RIGHT FOREARM  Final   Special Requests   Final    BOTTLES DRAWN AEROBIC AND ANAEROBIC Blood Culture adequate volume   Culture   Final    NO GROWTH 5 DAYS Performed at [facility], [address] [place], [place] [address]    Report Status 06/07/2019 FINAL  Final  Culture, blood (routine x 2)     Status: None   Collection Time: 06/11/2019  3:10 PM   Specimen: BLOOD  Result Value Ref Range Status   Specimen Description BLOOD A-LINE  Final   Special Requests   Final    BOTTLES DRAWN AEROBIC AND ANAEROBIC Blood Culture results may not be optimal due to an excessive volume of blood  received in culture bottles   Culture   Final    NO GROWTH 5 DAYS Performed at [facility], [address] [place], [place] [address]    Report Status 06/07/2019 FINAL  Final  Culture, blood (routine x 2)     Status: None   Collection Time: 05/23/2019  3:10 PM   Specimen: BLOOD  Result Value Ref Range Status   Specimen Description BLOOD A-LINE  Final   Special Requests   Final    BOTTLES DRAWN AEROBIC AND ANAEROBIC Blood Culture results may not be optimal due to an excessive volume of blood received in culture bottles   Culture   Final    NO GROWTH 5 DAYS Performed at [facility], [address] [place], [place] [address]    Report Status 06/07/2019 FINAL  Final  MRSA PCR Screening     Status: None   Collection Time: 06/03/19  5:45 PM   Specimen: Nasopharyngeal  Result Value Ref Range Status   MRSA by PCR NEGATIVE NEGATIVE Final    Comment:        The GeneXpert MRSA Assay (FDA approved for NASAL specimens only), is one component of a comprehensive MRSA colonization surveillance program. It is not intended to diagnose MRSA infection nor to guide or monitor treatment  for MRSA infections. Performed at [facility], [address] [place], [place] [address]   Urine Culture     Status: None   Collection Time: 06/03/19  6:20 PM   Specimen: Urine, Catheterized  Result Value Ref Range Status   Specimen Description   Final    URINE, CATHETERIZED Performed at [facility], [address] [place], [place] [address]    Special Requests   Final    Immunocompromised Performed at [facility], [address] [place], [place] [address]    Culture   Final    NO GROWTH Performed at [facility], [place] [address] [place], [place] [address]    Report Status 06/05/2019 FINAL  Final  Cath Tip Culture     Status: None   Collection Time: 05/18/2019  4:32 PM   Specimen: Catheter Tip; Other  Result Value Ref Range Status   Specimen Description   Final    CATH TIP Performed at [facility], [address], [place], [place] [address]    Special Requests   Final    NONE Performed at [facility] [address], [place], [place] [address]    Culture   Final    NO GROWTH 2 DAYS Performed at [facility], [place] [address] [place], [place] [address]    Report Status 06/07/2019 FINAL  Final  Culture, respiratory (non-expectorated)     Status: None   Collection Time: 06/06/19 10:22 AM   Specimen: Tracheal Aspirate; Respiratory  Result Value Ref Range Status   Specimen Description   Final    TRACHEAL ASPIRATE Performed at [facility], [address], [place], [place] [address]    Special Requests   Final    NONE Performed at [facility], [address] [place], [place] [address]    Gram Stain   Final    RARE WBC PRESENT, PREDOMINANTLY PMN NO ORGANISMS SEEN Performed at [facility], [place] [address] [place], [place] [address]    Culture   Final    RARE CANDIDA ALBICANS RARE FUNGUS (MOLD) ISOLATED, PROBABLE CONTAMINANT/COLONIZER (SAPROPHYTE). CONTACT MICROBIOLOGY IF  FURTHER IDENTIFICATION  REQUIRED [phone].    Report Status 06/09/2019 FINAL  Final  CULTURE, BLOOD (ROUTINE X 2) w Reflex to ID Panel     Status: None   Collection Time: 06/11/19  1:09 PM   Specimen: BLOOD  Result Value Ref Range Status   Specimen Description BLOOD A-LINE DRAW  Final   Special Requests   Final    BOTTLES DRAWN AEROBIC AND ANAEROBIC Blood Culture adequate volume   Culture   Final    NO GROWTH 5 DAYS Performed at [facility], [address] [place], [place] [address]    Report Status 06/16/2019 FINAL  Final  CULTURE, BLOOD (ROUTINE X 2) w Reflex to ID Panel     Status: None   Collection Time: 06/11/19  1:10 PM   Specimen: BLOOD  Result Value Ref Range Status   Specimen Description BLOOD A-LINE DRAW  Final   Special Requests   Final    BOTTLES DRAWN AEROBIC AND ANAEROBIC Blood Culture adequate volume   Culture   Final    NO GROWTH 5 DAYS Performed at [facility], [address] [place], [place] [address]    Report Status 06/16/2019 FINAL  Final  Culture, respiratory (non-expectorated)     Status: None   Collection Time: 06/11/19  6:04 PM   Specimen: Tracheal Aspirate; Respiratory  Result Value Ref Range Status   Specimen Description   Final    TRACHEAL ASPIRATE Performed at [facility], [address], [place], [place] [address]    Special Requests   Final    NONE Performed at [facility], [address] [place], [place] [address]    Gram Stain   Final    MODERATE WBC PRESENT, PREDOMINANTLY PMN FEW YEAST Performed at [facility], [place] [address], [place], [place] [address]    Culture FEW CANDIDA ALBICANS  Final   Report Status 06/14/2019 FINAL  Final    Coagulation Studies: No results for input(s): LABPROT, INR in the last 72 hours.  Urinalysis: No results for input(s): COLORURINE, LABSPEC, PHURINE, GLUCOSEU, HGBUR, BILIRUBINUR, KETONESUR, PROTEINUR, UROBILINOGEN, NITRITE, LEUKOCYTESUR in the  last 72 hours.  Invalid input(s): APPERANCEUR    Imaging: DG Chest Port 1 View  Result Date: 06/22/2019 CLINICAL DATA:  49 year old male with respiratory failure EXAM: PORTABLE CHEST 1 VIEW COMPARISON:  06/15/2019 FINDINGS: Cardiomediastinal silhouette unchanged in size and contour with cardiomegaly. Endotracheal tube terminates at the carina. The apical lordotic positioning may exaggerate the position, however, withdrawal of approximately 3.7 cm may position this better at the clavicular heads. Gastric tube terminates out of the field of view. Low lung volumes with patchy opacities bilaterally worst on the left, similar to the comparison. No pneumothorax. Blunting of the left costophrenic angle. IMPRESSION: Endotracheal tube terminates at the carina, and may be better positioned with withdrawing 3-4 cm. Gastric tube terminates out of the field of view. Persistently low lung volumes with bilateral mixed interstitial and airspace opacities and likely small left pleural effusion. Electronically Signed   By: Corrie Mckusick D.O.   On: 06/22/2019 14:17     Medications:   . sodium chloride 5 mL/hr at 06/23/19 1000  . fentaNYL infusion INTRAVENOUS 225 mcg/hr (06/23/19 1000)  . levETIRAcetam Stopped (06/23/19 0325)  . norepinephrine (LEVOPHED) Adult infusion Stopped (06/16/19 2312)  . propofol (DIPRIVAN) infusion 30 mcg/kg/min (06/23/19 1000)   .  stroke: mapping our early stages of recovery book   Does not apply Once  . aspirin  324  mg Per Tube Daily  . atorvastatin  80 mg Per Tube q morning - 10a  . B-complex with vitamin C  1 tablet Per Tube Daily  . calcitRIOL  0.25 mcg Per Tube Daily  . chlorhexidine gluconate (MEDLINE KIT)  15 mL Mouth Rinse BID  . Chlorhexidine Gluconate Cloth  6 each Topical BID  . cholestyramine  4 g Oral BID  . clopidogrel  75 mg Per Tube Daily  . epoetin (EPOGEN/PROCRIT) injection  10,000 Units Intravenous Q M,W,F-HD  . feeding supplement (PRO-STAT SUGAR FREE 64)  60  mL Per Tube TID  . feeding supplement (VITAL HIGH PROTEIN)  1,000 mL Per Tube Q24H  . insulin aspart  0-9 Units Subcutaneous Q4H  . ipratropium-albuterol  3 mL Nebulization TID  . mouth rinse  15 mL Mouth Rinse 10 times per day  . pantoprazole sodium  40 mg Per Tube Daily  . sevelamer carbonate  2.4 g Oral TID WC   acetaminophen **OR** acetaminophen, fentaNYL, heparin, ipratropium-albuterol, labetalol, midazolam, ondansetron **OR** ondansetron (ZOFRAN) IV, vecuronium  Assessment/ Plan:  [name] is a 49 y.o. Hispanic male with end stage renal disease, diabetes mellitus type I, hypertension, sleep apnea who was admitted to [facility] on 05/27/2019 for Hyperkalemia [E87.5] CVA (cerebral vascular accident) ([place]) [I63.9] Stroke ([place]) [I63.9] ESRD (end stage renal disease) on dialysis ([place]) [N18.6, Z99.2] Chest pain [R07.9]  [facility] MWF [address] RIJ 120kg  1. End stage renal disease on hemodialysis. Requiring CRRT from 12/21 to 12/24  Hemodialysis treatment yesterday. Tolerated treatment well.  Continue MWF schedule.  2. Acute Resp failure: requiring mechanical ventilation secondary to pneumonia.  - plan for tracheostomy later this week.   3. Anemia of chronic kidney disease: Hemoglobin 6.8 - PRBC transfusion as per primary team - EPO with HD treatment  4. Secondary Hyperparathyroidism with hypocalcemia and hyperphosphatemia.  - sevelamer    LOS: 21 Freddi Schrager 1/9/202112:12 PM

## 2019-06-23 NOTE — Progress Notes (Signed)
Subjective: Patient remains difficult to wean.  Intubated and sedated.    Objective: Current vital signs: BP (!) 138/43   Pulse 89   Temp 98.7 F (37.1 C) (Oral)   Resp 14   Ht 5' 7.99" (1.727 m)   Wt 133.2 kg   SpO2 98%   BMI 44.66 kg/m  Vital signs in last 24 hours: Temp:  [98 F (36.7 C)-99.2 F (37.3 C)] 98.7 F (37.1 C) (01/09 0400) Pulse Rate:  [87-110] 89 (01/09 0600) Resp:  [14-20] 14 (01/09 0600) BP: (116-196)/(29-62) 138/43 (01/09 0600) SpO2:  [98 %-100 %] 98 % (01/09 0802) FiO2 (%):  [28 %] 28 % (01/09 0802) Weight:  [133.2 kg] 133.2 kg (01/09 0445)  Intake/Output from previous day: 01/08 0701 - 01/09 0700 In: 895.6 [I.V.:265.6; NG/GT:630] Out: 1503  Intake/Output this shift: No intake/output data recorded. Nutritional status:  Diet Order    None      Neurologic Exam: Mental Status: Patient does not respond to verbal stimuli.  Does not respond to deep sternal rub.  Does not follow commands.  No verbalizations noted.  Cranial Nerves: II: patient does not respond confrontation bilaterally, pupils right 3 mm, left 3 mm,and reactive bilaterally III,IV,VI: Oculocephalic response present bilaterally.  V,VII: corneal reflex present bilaterally  VIII: patient does not respond to verbal stimuli IX,X: gag reflex unable to be tested, XI: trapezius strength unable to test bilaterally XII: tongue strength unable to test Motor: Extremities flaccid throughout.  No spontaneous movement noted.  No purposeful movements noted. Sensory: Does not respond to noxious stimuli in any extremity.  Lab Results: Basic Metabolic Panel: Recent Labs  Lab 06/19/19 0538 06/20/19 0445 06/21/19 0014 06/21/19 0432 06/22/19 0801 06/23/19 0535  NA 136 132*  --  134* 131* 133*  K 4.5 4.9  --  4.5 5.4* 4.3  CL 94* 93*  --  96* 94* 94*  CO2 26 24  --  _0 GLUCOSE 208* 180*  --  173* 170* 143*  BUN 108* 138*  --  112* 132* 76*  CREATININE 7.32* 8.52*  --  6.73* 7.93* 5.19*   CALCIUM 7.5* 7.1*  --  7.4* 7.1* 7.8*  MG  --  2.1  --   --   --   --   PHOS  --  9.0* 7.4* 7.1* 8.0* 5.3*    Liver Function Tests: Recent Labs  Lab 06/22/19 0801 06/23/19 0535  ALBUMIN 1.9* 2.1*   No results for input(s): LIPASE, AMYLASE in the last 168 hours. No results for input(s): AMMONIA in the last 168 hours.  CBC: Recent Labs  Lab 06/17/19 0359 06/18/19 0455 06/19/19 0538 06/20/19 0445 06/21/19 0432 06/21/19 1856 06/22/19 0801 06/23/19 0535  WBC 9.6 7.8 6.0 8.7 7.3  --  7.7 8.0  NEUTROABS 7.1 5.5 4.1 5.9 5.2  --   --   --   HGB 7.4* 7.4* 8.6* 7.5* 7.4* 7.5* 6.8* 6.8*  HCT 24.8* 22.7* 27.8* 24.4* 23.8* 24.3* 21.6* 22.0*  MCV 100.8* 95.4 99.6 99.6 99.2  --  98.2 99.5  PLT 355 317 301 327 286  --  273 278    Cardiac Enzymes: No results for input(s): CKTOTAL, CKMB, CKMBINDEX, TROPONINI in the last 168 hours.  Lipid Panel: Recent Labs  Lab 06/19/19 1239 06/22/19 0801  TRIG 137 200*    CBG: Recent Labs  Lab 06/22/19 1223 06/22/19 1950 06/22/19 2326 06/23/19 0324 06/23/19 0745  GLUCAP 180* 141* 170* 130* 149*    Microbiology:  Results for orders placed or performed during the hospital encounter of 06/07/2019  Respiratory Panel by RT PCR (Flu A&B, Covid) - Nasopharyngeal Swab     Status: None   Collection Time: 06/06/2019  2:35 AM   Specimen: Nasopharyngeal Swab  Result Value Ref Range Status   SARS Coronavirus 2 by RT PCR NEGATIVE NEGATIVE Final    Comment: (NOTE) SARS-CoV-2 target nucleic acids are NOT DETECTED. The SARS-CoV-2 RNA is generally detectable in upper respiratoy specimens during the acute phase of infection. The lowest concentration of SARS-CoV-2 viral copies this assay can detect is 131 copies/mL. A negative result does not preclude SARS-Cov-2 infection and should not be used as the sole basis for treatment or other patient management decisions. A negative result may occur with  improper specimen collection/handling, submission of  specimen other than nasopharyngeal swab, presence of viral mutation(s) within the areas targeted by this assay, and inadequate number of viral copies (<131 copies/mL). A negative result must be combined with clinical observations, patient history, and epidemiological information. The expected result is Negative. Fact Sheet for Patients:  PinkCheek.be Fact Sheet for Healthcare Providers:  GravelBags.it This test is not yet ap proved or cleared by the [place] FDA and  has been authorized for detection and/or diagnosis of SARS-CoV-2 by FDA under an Emergency Use Authorization (EUA). This EUA will remain  in effect (meaning this test can be used) for the duration of the COVID-19 declaration under Section 564(b)(1) of the Act, 21 U.S.C. section 360bbb-3(b)(1), unless the authorization is terminated or revoked sooner.    Influenza A by PCR NEGATIVE NEGATIVE Final   Influenza B by PCR NEGATIVE NEGATIVE Final    Comment: (NOTE) The Xpert Xpress SARS-CoV-2/FLU/RSV assay is intended as an aid in  the diagnosis of influenza from Nasopharyngeal swab specimens and  should not be used as a sole basis for treatment. Nasal washings and  aspirates are unacceptable for Xpert Xpress SARS-CoV-2/FLU/RSV  testing. Fact Sheet for Patients: PinkCheek.be Fact Sheet for Healthcare Providers: GravelBags.it This test is not yet approved or cleared by the [place] FDA and  has been authorized for detection and/or diagnosis of SARS-CoV-2 by  FDA under an Emergency Use Authorization (EUA). This EUA will remain  in effect (meaning this test can be used) for the duration of the  Covid-19 declaration under Section 564(b)(1) of the Act, 21  U.S.C. section 360bbb-3(b)(1), unless the authorization is  terminated or revoked. Performed at [facility], [address]  [place], [place] [address]   Culture, blood (routine x 2)     Status: None   Collection Time: 06/05/2019  2:36 AM   Specimen: BLOOD  Result Value Ref Range Status   Specimen Description BLOOD LEFT ANTECUBITAL  Final   Special Requests   Final    BOTTLES DRAWN AEROBIC AND ANAEROBIC Blood Culture adequate volume   Culture   Final    NO GROWTH 5 DAYS Performed at [facility], [address] [place], [place] [address]    Report Status 06/07/2019 FINAL  Final  Culture, blood (routine x 2)     Status: None   Collection Time: 05/29/2019  2:36 AM   Specimen: BLOOD  Result Value Ref Range Status   Specimen Description BLOOD RIGHT FOREARM  Final   Special Requests   Final    BOTTLES DRAWN AEROBIC AND ANAEROBIC Blood Culture adequate volume   Culture   Final    NO GROWTH 5 DAYS Performed at [facility], [place], [place] [address]    Report Status 06/07/2019 FINAL  Final  Culture, blood (routine x 2)     Status: None   Collection Time: 06/11/2019  3:10 PM   Specimen: BLOOD  Result Value Ref Range Status   Specimen Description BLOOD A-LINE  Final   Special Requests   Final    BOTTLES DRAWN AEROBIC AND ANAEROBIC Blood Culture results may not be optimal due to an excessive volume of blood received in culture bottles   Culture   Final    NO GROWTH 5 DAYS Performed at [facility], [address] [place], [place] [address]    Report Status 06/07/2019 FINAL  Final  Culture, blood (routine x 2)     Status: None   Collection Time: 06/05/2019  3:10 PM   Specimen: BLOOD  Result Value Ref Range Status   Specimen Description BLOOD A-LINE  Final   Special Requests   Final    BOTTLES DRAWN AEROBIC AND ANAEROBIC Blood Culture results may not be optimal due to an excessive volume of blood received in culture bottles   Culture   Final    NO GROWTH 5 DAYS Performed at [facility], [address] [place], [place] [address]    Report Status 06/07/2019  FINAL  Final  MRSA PCR Screening     Status: None   Collection Time: 06/03/19  5:45 PM   Specimen: Nasopharyngeal  Result Value Ref Range Status   MRSA by PCR NEGATIVE NEGATIVE Final    Comment:        The GeneXpert MRSA Assay (FDA approved for NASAL specimens only), is one component of a comprehensive MRSA colonization surveillance program. It is not intended to diagnose MRSA infection nor to guide or monitor treatment for MRSA infections. Performed at [facility], [address], [place], [place] [address]   Urine Culture     Status: None   Collection Time: 06/03/19  6:20 PM   Specimen: Urine, Catheterized  Result Value Ref Range Status   Specimen Description   Final    URINE, CATHETERIZED Performed at [facility], [address] [place], [place] [address]    Special Requests   Final    Immunocompromised Performed at [facility], [address] [place], [place] [address]    Culture   Final    NO GROWTH Performed at [facility], [place] [address], [place], [place] [address]    Report Status 06/05/2019 FINAL  Final  Cath Tip Culture     Status: None   Collection Time: 05/29/2019  4:32 PM   Specimen: Catheter Tip; Other  Result Value Ref Range Status   Specimen Description   Final    CATH TIP Performed at [facility], [address], [place], [place] [address]    Special Requests   Final    NONE Performed at [facility], [address] [place], [place] [address]    Culture   Final    NO GROWTH 2 DAYS Performed at [facility], [place] [address], [place],  [address]    Report Status 06/07/2019 FINAL  Final  Culture, respiratory (non-expectorated)     Status: None   Collection Time: 06/06/19 10:22 AM   Specimen: Tracheal Aspirate; Respiratory  Result Value Ref Range Status   Specimen Description   Final    TRACHEAL ASPIRATE Performed at [facility], [place],  [place] [address]    Special Requests   Final    NONE Performed at [facility], [place], [place] [address]    Gram Stain   Final    RARE WBC PRESENT, PREDOMINANTLY PMN NO ORGANISMS SEEN Performed at [facility], [place] [address], [place], [place] [address]    Culture   Final    RARE CANDIDA ALBICANS RARE FUNGUS (MOLD) ISOLATED, PROBABLE CONTAMINANT/COLONIZER (SAPROPHYTE). CONTACT MICROBIOLOGY IF FURTHER IDENTIFICATION REQUIRED [phone].    Report Status 06/09/2019 FINAL  Final  CULTURE, BLOOD (ROUTINE X 2) w Reflex to ID Panel     Status: None   Collection Time: 06/11/19  1:09 PM   Specimen: BLOOD  Result Value Ref Range Status   Specimen Description BLOOD A-LINE DRAW  Final   Special Requests   Final    BOTTLES DRAWN AEROBIC AND ANAEROBIC Blood Culture adequate volume   Culture   Final    NO GROWTH 5 DAYS Performed at [facility], [address] [place], [place] [address]    Report Status 06/16/2019 FINAL  Final  CULTURE, BLOOD (ROUTINE X 2) w Reflex to ID Panel     Status: None   Collection Time: 06/11/19  1:10 PM   Specimen: BLOOD  Result Value Ref Range Status   Specimen Description BLOOD A-LINE DRAW  Final   Special Requests   Final    BOTTLES DRAWN AEROBIC AND ANAEROBIC Blood Culture adequate volume   Culture   Final    NO GROWTH 5 DAYS Performed at [facility], [address] [place], [place] [address]    Report Status 06/16/2019 FINAL  Final  Culture, respiratory (non-expectorated)     Status: None   Collection Time: 06/11/19  6:04 PM   Specimen: Tracheal Aspirate; Respiratory  Result Value Ref Range Status   Specimen Description   Final    TRACHEAL ASPIRATE Performed at [facility], [address] [place], [place] [address]    Special Requests   Final    NONE Performed at [facility], [address] [place], [place] [address]    Gram Stain   Final    MODERATE WBC PRESENT,  PREDOMINANTLY PMN FEW YEAST Performed at [facility], [place] [address] [place], [place] [address]    Culture FEW CANDIDA ALBICANS  Final   Report Status 06/14/2019 FINAL  Final    Coagulation Studies: No results for input(s): LABPROT, INR in the last 72 hours.  Imaging: DG Abd 1 View  Result Date: 06/21/2019 CLINICAL DATA:  49 year old male enteric tube placement. EXAM: ABDOMEN - 1 VIEW COMPARISON:  Portable abdomen 05/20/2019 and earlier. FINDINGS: Portable AP semi upright view at 0954 hours. Enteric tube terminates in the distal gastric body, side hole the level of the gastric body. Stable visible lung bases. Negative visible bowel gas pattern. No acute osseous abnormality identified. IMPRESSION: Enteric tube in the stomach, side hole at the level of the gastric body. Electronically Signed   By: Genevie Ann M.D.   On: 06/21/2019 10:29   DG Chest Port 1 View  Result Date: 06/22/2019 CLINICAL DATA:  49 year old male with respiratory failure EXAM: PORTABLE CHEST 1 VIEW COMPARISON:  06/15/2019 FINDINGS: Cardiomediastinal silhouette unchanged in size and contour with cardiomegaly. Endotracheal tube terminates at the carina. The apical lordotic positioning may exaggerate the position, however, withdrawal of approximately 3.7 cm may position this better at the clavicular heads. Gastric tube terminates out  of the field of view. Low lung volumes with patchy opacities bilaterally worst on the left, similar to the comparison. No pneumothorax. Blunting of the left costophrenic angle. IMPRESSION: Endotracheal tube terminates at the carina, and may be better positioned with withdrawing 3-4 cm. Gastric tube terminates out of the field of view. Persistently low lung volumes with bilateral mixed interstitial and airspace opacities and likely small left pleural effusion. Electronically Signed   By: Corrie Mckusick D.O.   On: 06/22/2019 14:17    Medications:  I have reviewed the patient's current  medications. Scheduled: .  stroke: mapping our early stages of recovery book   Does not apply Once  . aspirin  324 mg Per Tube Daily  . atorvastatin  80 mg Per Tube q morning - 10a  . B-complex with vitamin C  1 tablet Per Tube Daily  . calcitRIOL  0.25 mcg Per Tube Daily  . chlorhexidine gluconate (MEDLINE KIT)  15 mL Mouth Rinse BID  . Chlorhexidine Gluconate Cloth  6 each Topical BID  . cholestyramine  4 g Oral BID  . clopidogrel  75 mg Per Tube Daily  . epoetin (EPOGEN/PROCRIT) injection  10,000 Units Intravenous Q M,W,F-HD  . feeding supplement (PRO-STAT SUGAR FREE 64)  60 mL Per Tube TID  . feeding supplement (VITAL HIGH PROTEIN)  1,000 mL Per Tube Q24H  . insulin aspart  0-9 Units Subcutaneous Q4H  . ipratropium-albuterol  3 mL Nebulization TID  . mouth rinse  15 mL Mouth Rinse 10 times per day  . pantoprazole sodium  40 mg Per Tube Daily  . sevelamer carbonate  2.4 g Oral TID WC    Assessment/Plan: 48 y.o.malewith a history of DM, HTN and ESRDadmitted afteran episode of syncope and seizure like activity. Patient with multiple metabolic abnormalities/infection at presentation that may have provoked seizure like activity but MRI imaging performed as well and on review shows multiple acute, small infarcts bilaterally. MRA unremarkable. Embolic etiology likely.EEG was only significant for slowing. Patient further declined from a respiratory standpoint requiring intubation and has had difficulty weaning. Neurological status has declined as well. Repeat MRI on12/31 andshowed increase in acute infarcts in the same distribution as previous imaging of 12/19.Repeat EEG only significant for slowing. Neurological examination relatively unchanged today.  Family making decisions about long term management.   Recommendations: 1. Will continue to follow with you   LOS: 21 days   [name], MD Neurology [phone] 06/23/2019  9:02 AM

## 2019-06-23 NOTE — Progress Notes (Signed)
CRITICAL CARE NOTE  Jake Gomez is a 49 yo M with a history of ESRD, OSA, and DMT2.  He presented to the ED on 12/19 with hyperkalemia after missing hemodialysis.  On workup, he was found to have an acute ischemic stroke, and pneumonia.  He was admitted to the ICU with acute respiratory distress.  COVID +    Lines / Drains: ET tube x 14 days on vent FiO2 28% PEEP 5.   Fistula Left upper arm Hemodialysis catheter right IJ PIV x2 right forearm, right upper arm Rectal tube NG/OG tube  Cultures / Sepsis markers: WBC stable at 7.3.  Patient afebrile for past 24 hours.    Tracheal aspirate culture on 12/28 demonstrated few candida albicans.  Blood cultures 12/28 showed no growth.   CC  follow up respiratory failure  SUBJECTIVE Patient remains critically ill Prognosis is guarded Multiorgan failure Severe pneumonia, severe brain damage from cva's Needs trach and peg tube for survival    BP (!) 156/50   Pulse 97   Temp 98.6 F (37 C) (Oral)   Resp 17   Ht 5' 7.99" (1.727 m)   Wt 134 kg   SpO2 100%   BMI 44.93 kg/m    I/O last 3 completed shifts: In: 3514.7 [I.V.:1584.7; Other:40; NG/GT:1690; IV Piggyback:200] Out: 1678 [Other:1503; Stool:175] No intake/output data recorded.  SpO2: 100 % O2 Flow Rate (L/min): 40 L/min FiO2 (%): 28 %   SIGNIFICANT EVENTS 12/19: presented to the ED for malaise after missing 2 dialysis sessions. Potassium 6.4, Creatinine 15.4. Acute ischemic CVA, with negative MR angiogram.  12/20: CT angio chest demonstrates consolidation in the left lower lobe. No evidence of PE.  12/29Has become diaphoretic this AM with weaning fentanyl, and episodes of tachypnea over-riding ventilator. Meeting in person with family  12/30- relatively unchanged with hemineglect, s/p repeat MRI -increased size brain infarcts 1/1 - no impromevent in mentation, continuing plan for end of life care decisions with family. Repeat CXR today to eval interval changes  post tx for asp pna. Repeat MRI discussed with family. 1/2-1/8: failure to wean off vent, severe brain damage, severe respiratory failure.  Unable to contact patient's family.  Told they are in [place].   1/8 unable to wean from [place]  PATIENT IS UNABLE TO PROVIDE COMPLETE REVIEW OF SYSTEMS DUE TO SEVERE CRITICAL ILLNESS   PHYSICAL EXAMINATION:  GENERAL:critically ill appearing, +resp distress HEAD: Normocephalic, atraumatic.  EYES: Pupils equal, round, reactive to light.  No scleral icterus.  MOUTH: Moist mucosal membrane. NECK: Supple.  PULMONARY: +rhonchi, +wheezing CARDIOVASCULAR: S1 and S2. Regular rate and rhythm. No murmurs, rubs, or gallops.  GASTROINTESTINAL: Soft, nontender, -distended.  Positive bowel sounds.   MUSCULOSKELETAL: No swelling, clubbing, or edema.  NEUROLOGIC: obtunded, GCS<8 SKIN:intact,warm,dry  MEDICATIONS: I have reviewed all medications and confirmed regimen as documented   CULTURE RESULTS   No results found for this or any previous visit (from the past 240 hour(s)).        IMAGING    DG Chest Port 1 View  Result Date: 06/22/2019 CLINICAL DATA:  49 year old male with respiratory failure EXAM: PORTABLE CHEST 1 VIEW COMPARISON:  06/15/2019 FINDINGS: Cardiomediastinal silhouette unchanged in size and contour with cardiomegaly. Endotracheal tube terminates at the carina. The apical lordotic positioning may exaggerate the position, however, withdrawal of approximately 3.7 cm may position this better at the clavicular heads. Gastric tube terminates out of the field of view. Low lung volumes with patchy opacities bilaterally  worst on the left, similar to the comparison. No pneumothorax. Blunting of the left costophrenic angle. IMPRESSION: Endotracheal tube terminates at the carina, and may be better positioned with withdrawing 3-4 cm. Gastric tube terminates out of the field of view. Persistently low lung volumes with bilateral mixed  interstitial and airspace opacities and likely small left pleural effusion. Electronically Signed   By: Corrie Mckusick D.O.   On: 06/22/2019 14:17      Indwelling Urinary Catheter continued, requirement due to   Reason to continue Indwelling Urinary Catheter strict Intake/Output monitoring for hemodynamic instability   Central Line/ continued, requirement due to  Reason to continue [place] of central venous pressure or other hemodynamic parameters and poor IV access   Ventilator continued, requirement due to severe respiratory failure   Ventilator Sedation RASS 0 to -2      ASSESSMENT AND PLAN SYNOPSIS   Severe ACUTE Hypoxic and Hypercapnic Respiratory Failure from acute left lung pneumonia with severe acute CVA -continue Full MV support -continue Bronchodilator Therapy -Wean Fio2 and PEEP as tolerated Unable to wean from vent Family NOT available to give consent for trach Prognosis is very poor   KIDNEY INJURY/Renal Failure -follow chem 7 ESRD on HD   NEUROLOGY - intubated and sedated - minimal sedation to achieve a RASS goal: -1 MRI on 12/30 consistent with multiple infarcts in centrum semiovale, corona radiata and periventricular white matter. - Continue aspirin, atorvastatin and clopidogrel   CARDIAC ICU monitoring  ID Completed IV abx    GI GI PROPHYLAXIS as indicated  NUTRITIONAL STATUS DIET-->TF's as tolerated Constipation protocol as indicated  ENDO - will use ICU hypoglycemic\Hyperglycemia protocol if indicated   ELECTROLYTES -follow labs as needed -replace as needed -pharmacy consultation and following   DVT/GI PRX ordered TRANSFUSIONS AS NEEDED MONITOR FSBS ASSESS the need for LABS as needed   Critical Care Time devoted to patient care services described in this note is 35 minutes.   Overall, patient is critically ill, prognosis is guarded.  Patient with Multiorgan failure and at high risk for cardiac arrest and  death.  Need to discuss goals of care including Trach placement, PEG placement and comfort care   Recommend DNR status and COMFORT CARE BUT FAMILY IS NOT AVAILABLE, PALLIATIVE CARE TEAM FOLLOWING, HE WILL NEED TRACH AND PEG TUBE TO SURVIVE  Maretta Bees [name], M.D.  [name]  Medical Director [place] Director [facility] Cardio-Pulmonary Department

## 2019-06-24 LAB — BASIC METABOLIC PANEL
Anion gap: 14 (ref 5–15)
BUN: 96 mg/dL — ABNORMAL HIGH (ref 6–20)
CO2: 25 mmol/L (ref 22–32)
Calcium: 7.9 mg/dL — ABNORMAL LOW (ref 8.9–10.3)
Chloride: 93 mmol/L — ABNORMAL LOW (ref 98–111)
Creatinine, Ser: 6.46 mg/dL — ABNORMAL HIGH (ref 0.61–1.24)
GFR calc Af Amer: 11 mL/min — ABNORMAL LOW (ref >60)
GFR calc non Af Amer: 9 mL/min — ABNORMAL LOW (ref >60)
Glucose, Bld: 129 mg/dL — ABNORMAL HIGH (ref 70–99)
Potassium: 4.6 mmol/L (ref 3.5–5.1)
Sodium: 132 mmol/L — ABNORMAL LOW (ref 135–145)

## 2019-06-24 LAB — CBC
HCT: 23.8 % — ABNORMAL LOW (ref 39.0–52.0)
Hemoglobin: 7.4 g/dL — ABNORMAL LOW (ref 13.0–17.0)
MCH: 30.6 pg (ref 26.0–34.0)
MCHC: 31.1 g/dL (ref 30.0–36.0)
MCV: 98.3 fL (ref 80.0–100.0)
Platelets: 257 10*3/uL (ref 150–400)
RBC: 2.42 MIL/uL — ABNORMAL LOW (ref 4.22–5.81)
RDW: 15.7 % — ABNORMAL HIGH (ref 11.5–15.5)
WBC: 8.1 10*3/uL (ref 4.0–10.5)
nRBC: 0 % (ref 0.0–0.2)

## 2019-06-24 LAB — GLUCOSE, CAPILLARY
Glucose-Capillary: 103 mg/dL — ABNORMAL HIGH (ref 70–99)
Glucose-Capillary: 104 mg/dL — ABNORMAL HIGH (ref 70–99)
Glucose-Capillary: 107 mg/dL — ABNORMAL HIGH (ref 70–99)
Glucose-Capillary: 122 mg/dL — ABNORMAL HIGH (ref 70–99)
Glucose-Capillary: 128 mg/dL — ABNORMAL HIGH (ref 70–99)
Glucose-Capillary: 95 mg/dL (ref 70–99)

## 2019-06-24 LAB — MAGNESIUM: Magnesium: 2 mg/dL (ref 1.7–2.4)

## 2019-06-24 MED ORDER — VITAL HIGH PROTEIN PO LIQD
1000.0000 mL | ORAL | Status: DC
  Administered 2019-06-24 – 2019-06-25 (×2): 1000 mL

## 2019-06-24 MED ORDER — HYDRALAZINE HCL 20 MG/ML IJ SOLN: 20.0000 mg | INTRAMUSCULAR | Status: DC | PRN

## 2019-06-24 MED ORDER — IPRATROPIUM-ALBUTEROL 0.5-2.5 (3) MG/3ML IN SOLN
3.0000 mL | Freq: Two times a day (BID) | RESPIRATORY_TRACT | Status: DC
  Administered 2019-06-24 – 2019-06-25 (×2): 3 mL via RESPIRATORY_TRACT
  Filled 2019-06-24 (×2): qty 3

## 2019-06-24 MED ORDER — CHLORHEXIDINE GLUCONATE 0.12 % MT SOLN
OROMUCOSAL | Status: AC
  Filled 2019-06-24: qty 15

## 2019-06-24 NOTE — Progress Notes (Signed)
Patient's BP was above 210 three times, moved cuff location each time, got new cuff, increased fentanyl and propofol and his bp went down to 144/123, patient had tears in his eyes, RN believes he was in pain. MD added PRN BP meds if needed.  Phillis Knack, RN

## 2019-06-24 NOTE — Progress Notes (Signed)
Discussed goals of care with pts sister Shant Renew who is currently at bedside visiting Jake Gomez.  She stated along with additional family members they have decided they would not want to prolong Jake Gomez suffering and would NOT want to proceed with tracheostomy or PEG tube placement.  She did ask if there was a way we could possibly extubate the pt and see how he does without mechanical ventilation.  I discussed in detail the meaning of comfort care vs. one way extubation.  I informed her if we proceeded with either one it would mean the pts code status would need to be changed to DNR.  We also discussed if he failed one way extubation he would NOT be reintubated, life saving measures would NOT be performed, and pt would transition to comfort measures only.  Although she was tearful she was appreciative of the information provided (she feels more comfortable making decisions now with her family), and would discuss this with additional family members prior to making a decision.  She will arrive at bedside on 06/24/18 to speak with Dr. Mortimer Fries after making a decision regarding goals of treatment.  Will continue to monitor and assess pt.  Marda Stalker, [place] Pager [phone] (please enter 7 digits) PCCM Consult Pager [phone] (please enter 7 digits)

## 2019-06-24 NOTE — Progress Notes (Signed)
Titrating up and down with his ivs to keep his BP within normal limits and manage his pain.  RN has not witness any seizures this shift, but patient has opened his eyes multiple times.    Patient was unable to respond when asked to blink if he was in pain.  (Unable to follow commands).  RN has been monitoring for signs of pain and adjusting his fentanyl to keep him comfortable.  Phillis Knack, RN

## 2019-06-24 NOTE — Progress Notes (Signed)
[facility]  ROUNDING NOTE   Subjective:   PRBC 2 units transfusion yesterday 1/9. Hemoglobin 7.4 (6.8)  Objective:  Vital signs in last 24 hours:  Temp:  [98.2 F (36.8 C)-98.8 F (37.1 C)] 98.5 F (36.9 C) (01/10 0740) Pulse Rate:  [70-91] 74 (01/10 0740) Resp:  [13-21] 20 (01/10 0740) BP: (105-173)/(33-74) 121/43 (01/10 0740) SpO2:  [98 %-100 %] 100 % (01/10 0800) FiO2 (%):  [28 %] 28 % (01/10 0800)  Weight change:  Filed Weights   06/20/19 1600 06/20/19 1915 06/23/19 0445  Weight: (!) 136.2 kg 134 kg 133.2 kg    Intake/Output: I/O last 3 completed shifts: In: 3983.3 [I.V.:1903.3; Blood:680; NG/GT:1100; IV Piggyback:300] Out: 25 [Emesis/NG output:25]   Intake/Output this shift:  No intake/output data recorded.  Physical Exam: General: Critically ill   HEENT: ETT  Lungs:  Pressure control FiO2 28%  bilateral rhonchi  Heart: Regular, 3/6 systolic murmur  Abdomen:  Soft, nontender, obese  Extremities: trace peripheral edema.  Neurologic: Intubated, currently off sedation  Skin: No lesions  Access: Left Arm AVF Dr. Lucky Cowboy 9/32/67    Basic Metabolic Panel: Recent Labs  Lab 06/20/19 0445 06/21/19 0014 06/21/19 0432 06/22/19 0801 06/23/19 0535 06/24/19 0447  NA 132*  --  134* 131* 133* 132*  K 4.9  --  4.5 5.4* 4.3 4.6  CL 93*  --  96* 94* 94* 93*  CO2 24  --  '25 23 26 25  '$ GLUCOSE 180*  --  173* 170* 143* 129*  BUN 138*  --  112* 132* 76* 96*  CREATININE 8.52*  --  6.73* 7.93* 5.19* 6.46*  CALCIUM 7.1*  --  7.4* 7.1* 7.8* 7.9*  MG 2.1  --   --   --   --  2.0  PHOS 9.0* 7.4* 7.1* 8.0* 5.3*  --     Liver Function Tests: Recent Labs  Lab 06/22/19 0801 06/23/19 0535  ALBUMIN 1.9* 2.1*   No results for input(s): LIPASE, AMYLASE in the last 168 hours. No results for input(s): AMMONIA in the last 168 hours.  CBC: Recent Labs  Lab 06/18/19 0455 06/19/19 0538 06/20/19 0445 06/21/19 0432 06/21/19 1856 06/22/19 0801 06/23/19 0535  06/24/19 0447  WBC 7.8 6.0 8.7 7.3  --  7.7 8.0 8.1  NEUTROABS 5.5 4.1 5.9 5.2  --   --   --   --   HGB 7.4* 8.6* 7.5* 7.4* 7.5* 6.8* 6.8* 7.4*  HCT 22.7* 27.8* 24.4* 23.8* 24.3* 21.6* 22.0* 23.8*  MCV 95.4 99.6 99.6 99.2  --  98.2 99.5 98.3  PLT 317 301 327 286  --  273 278 257    Cardiac Enzymes: No results for input(s): CKTOTAL, CKMB, CKMBINDEX, TROPONINI in the last 168 hours.  BNP: Invalid input(s): POCBNP  CBG: Recent Labs  Lab 06/23/19 1654 06/23/19 1937 06/23/19 2350 06/24/19 0342 06/24/19 0731  GLUCAP 135* 118* 119* 122* 128*    Microbiology: Results for orders placed or performed during the hospital encounter of 05/26/2019  Respiratory Panel by RT PCR (Flu A&B, Covid) - Nasopharyngeal Swab     Status: None   Collection Time: 05/16/2019  2:35 AM   Specimen: Nasopharyngeal Swab  Result Value Ref Range Status   SARS Coronavirus 2 by RT PCR NEGATIVE NEGATIVE Final    Comment: (NOTE) SARS-CoV-2 target nucleic acids are NOT DETECTED. The SARS-CoV-2 RNA is generally detectable in upper respiratoy specimens during the acute phase of infection. The lowest concentration of SARS-CoV-2 viral copies  this assay can detect is 131 copies/mL. A negative result does not preclude SARS-Cov-2 infection and should not be used as the sole basis for treatment or other patient management decisions. A negative result may occur with  improper specimen collection/handling, submission of specimen other than nasopharyngeal swab, presence of viral mutation(s) within the areas targeted by this assay, and inadequate number of viral copies (<131 copies/mL). A negative result must be combined with clinical observations, patient history, and epidemiological information. The expected result is Negative. Fact Sheet for Patients:  PinkCheek.be Fact Sheet for Healthcare Providers:  GravelBags.it This test is not yet ap proved or cleared by  the [place] FDA and  has been authorized for detection and/or diagnosis of SARS-CoV-2 by FDA under an Emergency Use Authorization (EUA). This EUA will remain  in effect (meaning this test can be used) for the duration of the COVID-19 declaration under Section 564(b)(1) of the Act, 21 U.S.C. section 360bbb-3(b)(1), unless the authorization is terminated or revoked sooner.    Influenza A by PCR NEGATIVE NEGATIVE Final   Influenza B by PCR NEGATIVE NEGATIVE Final    Comment: (NOTE) The Xpert Xpress SARS-CoV-2/FLU/RSV assay is intended as an aid in  the diagnosis of influenza from Nasopharyngeal swab specimens and  should not be used as a sole basis for treatment. Nasal washings and  aspirates are unacceptable for Xpert Xpress SARS-CoV-2/FLU/RSV  testing. Fact Sheet for Patients: PinkCheek.be Fact Sheet for Healthcare Providers: GravelBags.it This test is not yet approved or cleared by the [place] FDA and  has been authorized for detection and/or diagnosis of SARS-CoV-2 by  FDA under an Emergency Use Authorization (EUA). This EUA will remain  in effect (meaning this test can be used) for the duration of the  Covid-19 declaration under Section 564(b)(1) of the Act, 21  U.S.C. section 360bbb-3(b)(1), unless the authorization is  terminated or revoked. Performed at [facility], [address] [place], [place] [address]   Culture, blood (routine x 2)     Status: None   Collection Time: 06/07/2019  2:36 AM   Specimen: BLOOD  Result Value Ref Range Status   Specimen Description BLOOD LEFT ANTECUBITAL  Final   Special Requests   Final    BOTTLES DRAWN AEROBIC AND ANAEROBIC Blood Culture adequate volume   Culture   Final    NO GROWTH 5 DAYS Performed at [facility], [address], [place], [place] [address]    Report Status 06/07/2019 FINAL  Final  Culture, blood (routine x 2)     Status: None    Collection Time: 06/03/2019  2:36 AM   Specimen: BLOOD  Result Value Ref Range Status   Specimen Description BLOOD RIGHT FOREARM  Final   Special Requests   Final    BOTTLES DRAWN AEROBIC AND ANAEROBIC Blood Culture adequate volume   Culture   Final    NO GROWTH 5 DAYS Performed at [facility], [address] [place], [place] [address]    Report Status 06/07/2019 FINAL  Final  Culture, blood (routine x 2)     Status: None   Collection Time: 06/11/2019  3:10 PM   Specimen: BLOOD  Result Value Ref Range Status   Specimen Description BLOOD A-LINE  Final   Special Requests   Final    BOTTLES DRAWN AEROBIC AND ANAEROBIC Blood Culture results may not be optimal due to an excessive volume of blood received in culture bottles   Culture   Final  NO GROWTH 5 DAYS Performed at [facility], [address] [place], [place] [address]    Report Status 06/07/2019 FINAL  Final  Culture, blood (routine x 2)     Status: None   Collection Time: 06/01/2019  3:10 PM   Specimen: BLOOD  Result Value Ref Range Status   Specimen Description BLOOD A-LINE  Final   Special Requests   Final    BOTTLES DRAWN AEROBIC AND ANAEROBIC Blood Culture results may not be optimal due to an excessive volume of blood received in culture bottles   Culture   Final    NO GROWTH 5 DAYS Performed at [facility], [address] [place], [place] [address]    Report Status 06/07/2019 FINAL  Final  MRSA PCR Screening     Status: None   Collection Time: 06/03/19  5:45 PM   Specimen: Nasopharyngeal  Result Value Ref Range Status   MRSA by PCR NEGATIVE NEGATIVE Final    Comment:        The GeneXpert MRSA Assay (FDA approved for NASAL specimens only), is one component of a comprehensive MRSA colonization surveillance program. It is not intended to diagnose MRSA infection nor to guide or monitor treatment for MRSA infections. Performed at [facility], [address],  [place], [place] [address]   Urine Culture     Status: None   Collection Time: 06/03/19  6:20 PM   Specimen: Urine, Catheterized  Result Value Ref Range Status   Specimen Description   Final    URINE, CATHETERIZED Performed at [facility], [address] [place], [place] [address]    Special Requests   Final    Immunocompromised Performed at [facility], [address], [place], [place] [address]    Culture   Final    NO GROWTH Performed at [facility], [place] [address], [place], [place] [address]    Report Status 06/05/2019 FINAL  Final  Cath Tip Culture     Status: None   Collection Time: 06/13/2019  4:32 PM   Specimen: Catheter Tip; Other  Result Value Ref Range Status   Specimen Description   Final    CATH TIP Performed at [facility], [address], [place], [place] [address]    Special Requests   Final    NONE Performed at [facility], [address] [place], [place] [address]    Culture   Final    NO GROWTH 2 DAYS Performed at [facility], [place] [address], [place], [place] [address]    Report Status 06/07/2019 FINAL  Final  Culture, respiratory (non-expectorated)     Status: None   Collection Time: 06/06/19 10:22 AM   Specimen: Tracheal Aspirate; Respiratory  Result Value Ref Range Status   Specimen Description   Final    TRACHEAL ASPIRATE Performed at [facility], [address] [place], [place] [address]    Special Requests   Final    NONE Performed at [facility], [address] [place], [place] [address]    Gram Stain   Final    RARE WBC PRESENT, PREDOMINANTLY PMN NO ORGANISMS SEEN Performed at [facility], [place] [address] [place],  [address]    Culture   Final    RARE CANDIDA ALBICANS RARE FUNGUS (MOLD) ISOLATED, PROBABLE CONTAMINANT/COLONIZER (SAPROPHYTE). CONTACT MICROBIOLOGY IF FURTHER IDENTIFICATION REQUIRED [phone].    Report Status 06/09/2019 FINAL   Final  CULTURE,  BLOOD (ROUTINE X 2) w Reflex to ID Panel     Status: None   Collection Time: 06/11/19  1:09 PM   Specimen: BLOOD  Result Value Ref Range Status   Specimen Description BLOOD A-LINE DRAW  Final   Special Requests   Final    BOTTLES DRAWN AEROBIC AND ANAEROBIC Blood Culture adequate volume   Culture   Final    NO GROWTH 5 DAYS Performed at [facility], [address] [place], [place] [address]    Report Status 06/16/2019 FINAL  Final  CULTURE, BLOOD (ROUTINE X 2) w Reflex to ID Panel     Status: None   Collection Time: 06/11/19  1:10 PM   Specimen: BLOOD  Result Value Ref Range Status   Specimen Description BLOOD A-LINE DRAW  Final   Special Requests   Final    BOTTLES DRAWN AEROBIC AND ANAEROBIC Blood Culture adequate volume   Culture   Final    NO GROWTH 5 DAYS Performed at [facility], [address], [place], [place] [address]    Report Status 06/16/2019 FINAL  Final  Culture, respiratory (non-expectorated)     Status: None   Collection Time: 06/11/19  6:04 PM   Specimen: Tracheal Aspirate; Respiratory  Result Value Ref Range Status   Specimen Description   Final    TRACHEAL ASPIRATE Performed at [facility], [address], [place], [place] [address]    Special Requests   Final    NONE Performed at [facility], [address] [place], [place] [address]    Gram Stain   Final    MODERATE WBC PRESENT, PREDOMINANTLY PMN FEW YEAST Performed at [facility], [place] [address] [place], [place] [address]    Culture FEW CANDIDA ALBICANS  Final   Report Status 06/14/2019 FINAL  Final    Coagulation Studies: No results for input(s): LABPROT, INR in the last 72 hours.  Urinalysis: No results for input(s): COLORURINE, LABSPEC, PHURINE, GLUCOSEU, HGBUR, BILIRUBINUR, KETONESUR, PROTEINUR, UROBILINOGEN, NITRITE, LEUKOCYTESUR in the last 72 hours.  Invalid input(s): APPERANCEUR    Imaging: DG Chest Port 1  View  Result Date: 06/22/2019 CLINICAL DATA:  49 year old male with respiratory failure EXAM: PORTABLE CHEST 1 VIEW COMPARISON:  06/15/2019 FINDINGS: Cardiomediastinal silhouette unchanged in size and contour with cardiomegaly. Endotracheal tube terminates at the carina. The apical lordotic positioning may exaggerate the position, however, withdrawal of approximately 3.7 cm may position this better at the clavicular heads. Gastric tube terminates out of the field of view. Low lung volumes with patchy opacities bilaterally worst on the left, similar to the comparison. No pneumothorax. Blunting of the left costophrenic angle. IMPRESSION: Endotracheal tube terminates at the carina, and may be better positioned with withdrawing 3-4 cm. Gastric tube terminates out of the field of view. Persistently low lung volumes with bilateral mixed interstitial and airspace opacities and likely small left pleural effusion. Electronically Signed   By: Corrie Mckusick D.O.   On: 06/22/2019 14:17     Medications:   . sodium chloride 5 mL/hr at 06/23/19 2010  . fentaNYL infusion INTRAVENOUS 245 mcg/hr (06/24/19 0552)  . levETIRAcetam 500 mg (06/24/19 0322)  . norepinephrine (LEVOPHED) Adult infusion Stopped (06/16/19 2312)  . propofol (DIPRIVAN) infusion 20 mcg/kg/min (06/24/19 2952)   .  stroke: mapping our early stages of recovery book   Does not apply Once  . aspirin  324 mg Per Tube Daily  . atorvastatin  80 mg Per Tube  q morning - 10a  . B-complex with vitamin C  1 tablet Per Tube Daily  . calcitRIOL  0.25 mcg Per Tube Daily  . chlorhexidine gluconate (MEDLINE KIT)  15 mL Mouth Rinse BID  . Chlorhexidine Gluconate Cloth  6 each Topical BID  . cholestyramine  4 g Oral BID  . clopidogrel  75 mg Per Tube Daily  . epoetin (EPOGEN/PROCRIT) injection  10,000 Units Intravenous Q M,W,F-HD  . feeding supplement (PRO-STAT SUGAR FREE 64)  60 mL Per Tube TID  . feeding supplement (VITAL HIGH PROTEIN)  1,000 mL Per Tube  Q24H  . insulin aspart  0-9 Units Subcutaneous Q4H  . ipratropium-albuterol  3 mL Nebulization TID  . mouth rinse  15 mL Mouth Rinse 10 times per day  . pantoprazole sodium  40 mg Per Tube Daily  . sevelamer carbonate  2.4 g Oral TID WC   acetaminophen **OR** acetaminophen, fentaNYL, heparin, ipratropium-albuterol, labetalol, midazolam, ondansetron **OR** ondansetron (ZOFRAN) IV, vecuronium  Assessment/ Plan:  [name] is a 49 y.o. Hispanic male with end stage renal disease, diabetes mellitus type I, hypertension, sleep apnea who was admitted to [facility] on 05/30/2019 for Hyperkalemia [E87.5] CVA (cerebral vascular accident) ([place]) [I63.9] Stroke ([place]) [I63.9] ESRD (end stage renal disease) on dialysis ([place]) [N18.6, Z99.2] Chest pain [R07.9]  [facility] MWF [address] RIJ 120kg  1. End stage renal disease on hemodialysis. Requiring CRRT from 12/21 to 12/24  Hemodialysis treatment 1/8. Tolerated treatment well.  Continue MWF schedule.   2. Acute Resp failure: requiring mechanical ventilation secondary to pneumonia.  - plan for tracheostomy as per family  3. Anemia of chronic kidney disease: Status post 2 units PRBC 1/9.  - EPO with HD treatment  4. Secondary Hyperparathyroidism with hypocalcemia and hyperphosphatemia.  - sevelamer  - calcitriol - Check PTH   LOS: 22 Moiz Ryant 1/10/20219:22 AM

## 2019-06-24 NOTE — Progress Notes (Signed)
CRITICAL CARE NOTE  Jake Gomez is a 49 yo M with a history of ESRD, OSA, and DMT2. He presented to the ED on 12/19 with hyperkalemia after missing hemodialysis. On workup, he was found to have an acute ischemic stroke, and pneumonia. He was admitted to the ICU with acute respiratory distress. COVID +    Lines / Drains: ET tube x 14 days on vent FiO2 28% PEEP 5.  Fistula Left upper arm Hemodialysis catheter right IJ PIV x2 right forearm, right upper arm Rectal tube NG/OG tube  Cultures / Sepsis markers: WBC stable at 7.3. Patient afebrile for past 24 hours.   Tracheal aspirate culture on 12/28 demonstrated few candida albicans. Blood cultures 12/28 showed no growth.     CC  follow up respiratory failure  SUBJECTIVE Patient remains critically ill Prognosis is guarded Severe resp failure Severe brain damage from CVA's   BP (!) 137/48 (BP Location: Right Leg)   Pulse 81   Temp 98.7 F (37.1 C) (Oral)   Resp (!) 21   Ht 5' 7.99" (1.727 m)   Wt 133.2 kg   SpO2 99%   BMI 44.66 kg/m    I/O last 3 completed shifts: In: 3983.3 [I.V.:1903.3; Blood:680; NG/GT:1100; IV Piggyback:300] Out: 25 [Emesis/NG output:25] No intake/output data recorded.  SpO2: 99 % O2 Flow Rate (L/min): 40 L/min FiO2 (%): 28 %   SIGNIFICANT EVENTS 12/19: presented to the ED for malaise after missing 2 dialysis sessions. Potassium 6.4, Creatinine 15.4. Acute ischemic CVA, with negative MR angiogram.  12/20: CT angio chest demonstrates consolidation in the left lower lobe. No evidence of PE.  12/29Has become diaphoretic this AM with weaning fentanyl, and episodes of tachypnea over-riding ventilator. Meeting in person with family  12/30- relatively unchanged with hemineglect, s/p repeat MRI -increased size brain infarcts 1/1 - no impromevent in mentation, continuing plan for end of life care decisions with family. Repeat CXR today to eval interval changes post tx for asp pna.  Repeat MRI discussed with family. 1/2-1/8: failure to wean off vent, severe brain damage, severe respiratory failure. Unable to contact patient's family. Told they are in [place].  1/8 unable to wean from Vent 1/10 severe lung damage and severe brain damage  REVIEW OF SYSTEMS  PATIENT IS UNABLE TO PROVIDE COMPLETE REVIEW OF SYSTEMS DUE TO SEVERE CRITICAL ILLNESS   PHYSICAL EXAMINATION:  GENERAL:critically ill appearing, +resp distress HEAD: Normocephalic, atraumatic.  EYES: Pupils equal, round, reactive to light.  No scleral icterus.  MOUTH: Moist mucosal membrane. NECK: Supple.  PULMONARY: +rhonchi, +wheezing CARDIOVASCULAR: S1 and S2. Regular rate and rhythm. No murmurs, rubs, or gallops.  GASTROINTESTINAL: Soft, nontender, -distended.  Positive bowel sounds.   MUSCULOSKELETAL: No swelling, clubbing, or edema.  NEUROLOGIC: obtunded, GCS<8 SKIN:intact,warm,dry  MEDICATIONS: I have reviewed all medications and confirmed regimen as documented   CULTURE RESULTS   No results found for this or any previous visit (from the past 240 hour(s)).        Indwelling Urinary Catheter continued, requirement due to   Reason to continue Indwelling Urinary Catheter strict Intake/Output monitoring for hemodynamic instability         Ventilator continued, requirement due to severe respiratory failure   Ventilator Sedation RASS 0 to -2      ASSESSMENT AND PLAN SYNOPSIS  Severe ACUTE Hypoxic and Hypercapnic Respiratory Failure from acute left lung pneumonia with severe acute CVA    Severe ACUTE Hypoxic and Hypercapnic Respiratory Failure -continue Full MV support -continue Bronchodilator Therapy -  Wean Fio2 and PEEP as tolerated     KIDNEY INJURY/Renal Failure ESRD on HD Follow up nephrology recs   NEUROLOGY Severe brain damage from acute CVA's Poor prognosis Trach/PEG versus CMO Sister contacted will need definitve answer soon She states that she will meet with  family and call us back   CARDIAC ICU monitoring   GI GI PROPHYLAXIS as indicated  NUTRITIONAL STATUS DIET-->TF's as tolerated Constipation protocol as indicated  ENDO - will use ICU hypoglycemic\Hyperglycemia protocol if indicated   ELECTROLYTES -follow labs as needed -replace as needed -pharmacy consultation and following   DVT/GI PRX ordered TRANSFUSIONS AS NEEDED MONITOR FSBS ASSESS the need for LABS as needed   Critical Care Time devoted to patient care services described in this note is 35 minutes.   Overall, patient is critically ill, prognosis is guarded.  Patient with Multiorgan failure and at high risk for cardiac arrest and death.   RECOMMEND DNR STATUS AND COMFORT CARE MEASURES  Jake Gomez [name], M.D.  [name]  Medical Director [place] Director [facility] Cardio-Pulmonary Department

## 2019-06-25 DIAGNOSIS — G9341 Metabolic encephalopathy: Secondary | ICD-10-CM

## 2019-06-25 LAB — BASIC METABOLIC PANEL
Anion gap: 15 (ref 5–15)
BUN: 111 mg/dL — ABNORMAL HIGH (ref 6–20)
CO2: 22 mmol/L (ref 22–32)
Calcium: 7.8 mg/dL — ABNORMAL LOW (ref 8.9–10.3)
Chloride: 93 mmol/L — ABNORMAL LOW (ref 98–111)
Creatinine, Ser: 7.12 mg/dL — ABNORMAL HIGH (ref 0.61–1.24)
GFR calc Af Amer: 10 mL/min — ABNORMAL LOW (ref >60)
GFR calc non Af Amer: 8 mL/min — ABNORMAL LOW (ref >60)
Glucose, Bld: 111 mg/dL — ABNORMAL HIGH (ref 70–99)
Potassium: 5.2 mmol/L — ABNORMAL HIGH (ref 3.5–5.1)
Sodium: 130 mmol/L — ABNORMAL LOW (ref 135–145)

## 2019-06-25 LAB — CBC WITH DIFFERENTIAL/PLATELET
Abs Immature Granulocytes: 0.12 10*3/uL — ABNORMAL HIGH (ref 0.00–0.07)
Basophils Absolute: 0 10*3/uL (ref 0.0–0.1)
Basophils Relative: 0 %
Eosinophils Absolute: 0.5 10*3/uL (ref 0.0–0.5)
Eosinophils Relative: 6 %
HCT: 22.4 % — ABNORMAL LOW (ref 39.0–52.0)
Hemoglobin: 6.9 g/dL — ABNORMAL LOW (ref 13.0–17.0)
Immature Granulocytes: 2 %
Lymphocytes Relative: 15 %
Lymphs Abs: 1.2 10*3/uL (ref 0.7–4.0)
MCH: 30.1 pg (ref 26.0–34.0)
MCHC: 30.8 g/dL (ref 30.0–36.0)
MCV: 97.8 fL (ref 80.0–100.0)
Monocytes Absolute: 0.7 10*3/uL (ref 0.1–1.0)
Monocytes Relative: 9 %
Neutro Abs: 5.2 10*3/uL (ref 1.7–7.7)
Neutrophils Relative %: 68 %
Platelets: 222 10*3/uL (ref 150–400)
RBC: 2.29 MIL/uL — ABNORMAL LOW (ref 4.22–5.81)
RDW: 15.7 % — ABNORMAL HIGH (ref 11.5–15.5)
WBC: 7.6 10*3/uL (ref 4.0–10.5)
nRBC: 0 % (ref 0.0–0.2)

## 2019-06-25 LAB — GLUCOSE, CAPILLARY
Glucose-Capillary: 86 mg/dL (ref 70–99)
Glucose-Capillary: 109 mg/dL — ABNORMAL HIGH (ref 70–99)
Glucose-Capillary: 140 mg/dL — ABNORMAL HIGH (ref 70–99)

## 2019-06-25 MED ORDER — GLYCOPYRROLATE 0.2 MG/ML IJ SOLN: 0.2000 mg | INTRAMUSCULAR | Status: DC | PRN

## 2019-06-25 MED ORDER — SODIUM CHLORIDE 0.9% IV SOLUTION: Freq: Once | INTRAVENOUS | Status: DC

## 2019-06-25 MED ORDER — MORPHINE SULFATE (PF) 2 MG/ML IV SOLN: 2.0000 mg | INTRAVENOUS | Status: DC | PRN

## 2019-06-25 MED ORDER — ACETAMINOPHEN 650 MG RE SUPP: 650.0000 mg | Freq: Four times a day (QID) | RECTAL | Status: DC | PRN

## 2019-06-25 MED ORDER — LORAZEPAM 2 MG/ML IJ SOLN
2.0000 mg | INTRAMUSCULAR | Status: DC | PRN
  Administered 2019-06-25: 14:00:00 4 mg via INTRAVENOUS
  Filled 2019-06-25: qty 2

## 2019-06-25 MED ORDER — DEXTROSE 5 % IV SOLN: INTRAVENOUS | Status: DC

## 2019-06-25 MED ORDER — GLYCOPYRROLATE 1 MG PO TABS
1.0000 mg | ORAL_TABLET | ORAL | Status: DC | PRN
  Filled 2019-06-25: qty 1

## 2019-06-25 MED ORDER — ACETAMINOPHEN 325 MG PO TABS: 650.0000 mg | ORAL_TABLET | Freq: Four times a day (QID) | ORAL | Status: DC | PRN

## 2019-06-25 MED ORDER — POLYVINYL ALCOHOL 1.4 % OP SOLN
1.0000 [drp] | Freq: Four times a day (QID) | OPHTHALMIC | Status: DC | PRN
  Filled 2019-06-25: qty 15

## 2019-06-25 MED ORDER — DIPHENHYDRAMINE HCL 50 MG/ML IJ SOLN: 25.0000 mg | INTRAMUSCULAR | Status: DC | PRN

## 2019-06-25 MED ORDER — MORPHINE BOLUS VIA INFUSION
5.0000 mg | INTRAVENOUS | Status: DC | PRN
  Filled 2019-06-25: qty 5

## 2019-06-25 MED ORDER — MORPHINE 100MG IN NS 100ML (1MG/ML) PREMIX INFUSION
0.0000 mg/h | INTRAVENOUS | Status: DC
  Administered 2019-06-25: 13:00:00 5 mg/h via INTRAVENOUS
  Filled 2019-06-25 (×2): qty 100

## 2019-06-27 LAB — BPAM RBC
Blood Product Expiration Date: 202102022359
Blood Product Expiration Date: 202102102359
ISSUE DATE / TIME: 202101091601
Unit Type and Rh: 5100
Unit Type and Rh: 5100

## 2019-06-27 LAB — TYPE AND SCREEN
ABO/RH(D): O POS
Antibody Screen: NEGATIVE
Unit division: 0
Unit division: 0

## 2019-06-27 LAB — PREPARE RBC (CROSSMATCH)

## 2019-07-16 NOTE — Progress Notes (Signed)
[facility]  ROUNDING NOTE   Subjective:   Hemodialysis for later today.   Hemoglobin 6.9  Objective:  Vital signs in last 24 hours:  Temp:  [98.3 F (36.8 C)-98.5 F (36.9 C)] 98.4 F (36.9 C) (01/11 0200) Pulse Rate:  [69-88] 73 (01/11 0300) Resp:  [11-19] 14 (01/11 0300) BP: (110-187)/(34-123) 144/41 (01/11 0300) SpO2:  [99 %-100 %] 100 % (01/11 0838) FiO2 (%):  [28 %] 28 % (01/11 0838)  Weight change:  Filed Weights   06/20/19 1600 06/20/19 1915 06/23/19 0445  Weight: (!) 136.2 kg 134 kg 133.2 kg    Intake/Output: I/O last 3 completed shifts: In: 2002.5 [I.V.:2002.5] Out: -    Intake/Output this shift:  No intake/output data recorded.  Physical Exam: General: Critically ill   HEENT: ETT, right sided gaze  Lungs:  Pressure control FiO2 28%  bilateral rhonchi  Heart: Regular, 3/6 systolic murmur  Abdomen:  Soft, nontender, obese  Extremities: trace peripheral edema.  Neurologic: Intubated, sedated,  Skin: No lesions  Access: Left Arm AVF Dr. Lucky Cowboy 9/76/73    Basic Metabolic Panel: Recent Labs  Lab 06/20/19 0445 06/21/19 0014 06/21/19 4193 06/22/19 0801 06/23/19 0535 06/24/19 0447 07/15/2019 0636  NA 132*  --  134* 131* 133* 132* 130*  K 4.9  --  4.5 5.4* 4.3 4.6 5.2*  CL 93*  --  96* 94* 94* 93* 93*  CO2 24  --  '25 23 26 25 22  '$ GLUCOSE 180*  --  173* 170* 143* 129* 111*  BUN 138*  --  112* 132* 76* 96* 111*  CREATININE 8.52*  --  6.73* 7.93* 5.19* 6.46* 7.12*  CALCIUM 7.1*  --  7.4* 7.1* 7.8* 7.9* 7.8*  MG 2.1  --   --   --   --  2.0  --   PHOS 9.0* 7.4* 7.1* 8.0* 5.3*  --   --     Liver Function Tests: Recent Labs  Lab 06/22/19 0801 06/23/19 0535  ALBUMIN 1.9* 2.1*   No results for input(s): LIPASE, AMYLASE in the last 168 hours. No results for input(s): AMMONIA in the last 168 hours.  CBC: Recent Labs  Lab 06/19/19 0538 06/20/19 0445 06/21/19 0432 06/21/19 1856 06/22/19 0801 06/23/19 0535 06/24/19 0447  July 15, 2019 0636  WBC 6.0 8.7 7.3  --  7.7 8.0 8.1 7.6  NEUTROABS 4.1 5.9 5.2  --   --   --   --  5.2  HGB 8.6* 7.5* 7.4* 7.5* 6.8* 6.8* 7.4* 6.9*  HCT 27.8* 24.4* 23.8* 24.3* 21.6* 22.0* 23.8* 22.4*  MCV 99.6 99.6 99.2  --  98.2 99.5 98.3 97.8  PLT 301 327 286  --  273 278 257 222    Cardiac Enzymes: No results for input(s): CKTOTAL, CKMB, CKMBINDEX, TROPONINI in the last 168 hours.  BNP: Invalid input(s): POCBNP  CBG: Recent Labs  Lab 06/24/19 1654 06/24/19 1927 06/24/19 2357 07/15/2019 0415 07/15/2019 0755  GLUCAP 103* 95 104* 86 109*    Microbiology: Results for orders placed or performed during the hospital encounter of 05/27/2019  Respiratory Panel by RT PCR (Flu A&B, Covid) - Nasopharyngeal Swab     Status: None   Collection Time: 05/26/2019  2:35 AM   Specimen: Nasopharyngeal Swab  Result Value Ref Range Status   SARS Coronavirus 2 by RT PCR NEGATIVE NEGATIVE Final    Comment: (NOTE) SARS-CoV-2 target nucleic acids are NOT DETECTED. The SARS-CoV-2 RNA is generally detectable in upper respiratoy specimens during the  acute phase of infection. The lowest concentration of SARS-CoV-2 viral copies this assay can detect is 131 copies/mL. A negative result does not preclude SARS-Cov-2 infection and should not be used as the sole basis for treatment or other patient management decisions. A negative result may occur with  improper specimen collection/handling, submission of specimen other than nasopharyngeal swab, presence of viral mutation(s) within the areas targeted by this assay, and inadequate number of viral copies (<131 copies/mL). A negative result must be combined with clinical observations, patient history, and epidemiological information. The expected result is Negative. Fact Sheet for Patients:  PinkCheek.be Fact Sheet for Healthcare Providers:  GravelBags.it This test is not yet ap proved or cleared by the  [place] FDA and  has been authorized for detection and/or diagnosis of SARS-CoV-2 by FDA under an Emergency Use Authorization (EUA). This EUA will remain  in effect (meaning this test can be used) for the duration of the COVID-19 declaration under Section 564(b)(1) of the Act, 21 U.S.C. section 360bbb-3(b)(1), unless the authorization is terminated or revoked sooner.    Influenza A by PCR NEGATIVE NEGATIVE Final   Influenza B by PCR NEGATIVE NEGATIVE Final    Comment: (NOTE) The Xpert Xpress SARS-CoV-2/FLU/RSV assay is intended as an aid in  the diagnosis of influenza from Nasopharyngeal swab specimens and  should not be used as a sole basis for treatment. Nasal washings and  aspirates are unacceptable for Xpert Xpress SARS-CoV-2/FLU/RSV  testing. Fact Sheet for Patients: PinkCheek.be Fact Sheet for Healthcare Providers: GravelBags.it This test is not yet approved or cleared by the [place] FDA and  has been authorized for detection and/or diagnosis of SARS-CoV-2 by  FDA under an Emergency Use Authorization (EUA). This EUA will remain  in effect (meaning this test can be used) for the duration of the  Covid-19 declaration under Section 564(b)(1) of the Act, 21  U.S.C. section 360bbb-3(b)(1), unless the authorization is  terminated or revoked. Performed at [facility], [address] [place], [place] [address]   Culture, blood (routine x 2)     Status: None   Collection Time: 05/16/2019  2:36 AM   Specimen: BLOOD  Result Value Ref Range Status   Specimen Description BLOOD LEFT ANTECUBITAL  Final   Special Requests   Final    BOTTLES DRAWN AEROBIC AND ANAEROBIC Blood Culture adequate volume   Culture   Final    NO GROWTH 5 DAYS Performed at [facility], [address], [place], [place] [address]    Report Status 06/07/2019 FINAL  Final  Culture, blood (routine x 2)     Status: None    Collection Time: 05/27/2019  2:36 AM   Specimen: BLOOD  Result Value Ref Range Status   Specimen Description BLOOD RIGHT FOREARM  Final   Special Requests   Final    BOTTLES DRAWN AEROBIC AND ANAEROBIC Blood Culture adequate volume   Culture   Final    NO GROWTH 5 DAYS Performed at [facility], [address] [place], [place] [address]    Report Status 06/07/2019 FINAL  Final  Culture, blood (routine x 2)     Status: None   Collection Time: 05/31/2019  3:10 PM   Specimen: BLOOD  Result Value Ref Range Status   Specimen Description BLOOD A-LINE  Final   Special Requests   Final    BOTTLES DRAWN AEROBIC AND ANAEROBIC Blood Culture results may not be optimal due to an excessive volume of blood  received in culture bottles   Culture   Final    NO GROWTH 5 DAYS Performed at [facility], [address] [place], [place] [address]    Report Status 06/07/2019 FINAL  Final  Culture, blood (routine x 2)     Status: None   Collection Time: 06/14/2019  3:10 PM   Specimen: BLOOD  Result Value Ref Range Status   Specimen Description BLOOD A-LINE  Final   Special Requests   Final    BOTTLES DRAWN AEROBIC AND ANAEROBIC Blood Culture results may not be optimal due to an excessive volume of blood received in culture bottles   Culture   Final    NO GROWTH 5 DAYS Performed at [facility], [address] [place], [place] [address]    Report Status 06/07/2019 FINAL  Final  MRSA PCR Screening     Status: None   Collection Time: 06/03/19  5:45 PM   Specimen: Nasopharyngeal  Result Value Ref Range Status   MRSA by PCR NEGATIVE NEGATIVE Final    Comment:        The GeneXpert MRSA Assay (FDA approved for NASAL specimens only), is one component of a comprehensive MRSA colonization surveillance program. It is not intended to diagnose MRSA infection nor to guide or monitor treatment for MRSA infections. Performed at [facility], [address],  [place], [place] [address]   Urine Culture     Status: None   Collection Time: 06/03/19  6:20 PM   Specimen: Urine, Catheterized  Result Value Ref Range Status   Specimen Description   Final    URINE, CATHETERIZED Performed at [facility], [address], [place], [place] [address]    Special Requests   Final    Immunocompromised Performed at [facility], [address], [place], [place] [address]    Culture   Final    NO GROWTH Performed at [facility], [place] [address], [place], [place] [address]    Report Status 06/05/2019 FINAL  Final  Cath Tip Culture     Status: None   Collection Time: 06/08/2019  4:32 PM   Specimen: Catheter Tip; Other  Result Value Ref Range Status   Specimen Description   Final    CATH TIP Performed at [facility], [address], [place], [place] [address]    Special Requests   Final    NONE Performed at [facility], [address] [place], [place] [address]    Culture   Final    NO GROWTH 2 DAYS Performed at [facility], [place] [address], [place], [place] [address]    Report Status 06/07/2019 FINAL  Final  Culture, respiratory (non-expectorated)     Status: None   Collection Time: 06/06/19 10:22 AM   Specimen: Tracheal Aspirate; Respiratory  Result Value Ref Range Status   Specimen Description   Final    TRACHEAL ASPIRATE Performed at [facility], [address] [place],  [address]    Special Requests   Final    NONE Performed at [facility], [place] [place],  [address]    Gram Stain   Final    RARE WBC PRESENT, PREDOMINANTLY PMN NO ORGANISMS SEEN Performed at [facility], [place] [address], [place],  [address]    Culture   Final    RARE CANDIDA ALBICANS RARE FUNGUS (MOLD) ISOLATED, PROBABLE CONTAMINANT/COLONIZER (SAPROPHYTE). CONTACT MICROBIOLOGY IF FURTHER IDENTIFICATION REQUIRED  [phone].    Report Status 06/09/2019 FINAL   Final  CULTURE, BLOOD (ROUTINE X 2) w Reflex to ID Panel     Status: None   Collection Time: 06/11/19  1:09 PM   Specimen: BLOOD  Result Value Ref Range Status   Specimen Description BLOOD A-LINE DRAW  Final   Special Requests   Final    BOTTLES DRAWN AEROBIC AND ANAEROBIC Blood Culture adequate volume   Culture   Final    NO GROWTH 5 DAYS Performed at [facility], [address] [place], [place] [address]    Report Status 06/16/2019 FINAL  Final  CULTURE, BLOOD (ROUTINE X 2) w Reflex to ID Panel     Status: None   Collection Time: 06/11/19  1:10 PM   Specimen: BLOOD  Result Value Ref Range Status   Specimen Description BLOOD A-LINE DRAW  Final   Special Requests   Final    BOTTLES DRAWN AEROBIC AND ANAEROBIC Blood Culture adequate volume   Culture   Final    NO GROWTH 5 DAYS Performed at [facility], [address], [place], [place] [address]    Report Status 06/16/2019 FINAL  Final  Culture, respiratory (non-expectorated)     Status: None   Collection Time: 06/11/19  6:04 PM   Specimen: Tracheal Aspirate; Respiratory  Result Value Ref Range Status   Specimen Description   Final    TRACHEAL ASPIRATE Performed at [facility], [address] [place], [place] [address]    Special Requests   Final    NONE Performed at [facility], [address] [place], [place] [address]    Gram Stain   Final    MODERATE WBC PRESENT, PREDOMINANTLY PMN FEW YEAST Performed at [facility], [place] [address], [place], [place] [address]    Culture FEW CANDIDA ALBICANS  Final   Report Status 06/14/2019 FINAL  Final    Coagulation Studies: No results for input(s): LABPROT, INR in the last 72 hours.  Urinalysis: No results for input(s): COLORURINE, LABSPEC, PHURINE, GLUCOSEU, HGBUR, BILIRUBINUR, KETONESUR, PROTEINUR, UROBILINOGEN, NITRITE, LEUKOCYTESUR in the last 72 hours.  Invalid input(s): APPERANCEUR    Imaging: No results  found.   Medications:   . sodium chloride 5 mL/hr at 07/20/19 0000  . fentaNYL infusion INTRAVENOUS 350 mcg/hr (2019/07/20 0514)  . levETIRAcetam 500 mg (Jul 20, 2019 0242)  . norepinephrine (LEVOPHED) Adult infusion Stopped (06/16/19 2312)  . propofol (DIPRIVAN) infusion 20 mcg/kg/min (July 20, 2019 0617)   .  stroke: mapping our early stages of recovery book   Does not apply Once  . aspirin  324 mg Per Tube Daily  . atorvastatin  80 mg Per Tube q morning - 10a  . B-complex with vitamin C  1 tablet Per Tube Daily  . calcitRIOL  0.25 mcg Per Tube Daily  . chlorhexidine gluconate (MEDLINE KIT)  15 mL Mouth Rinse BID  . Chlorhexidine Gluconate Cloth  6 each Topical BID  . cholestyramine  4 g Oral BID  . clopidogrel  75 mg Per Tube Daily  . epoetin (EPOGEN/PROCRIT) injection  10,000 Units Intravenous Q M,W,F-HD  . feeding supplement (PRO-STAT SUGAR FREE 64)  60 mL Per Tube TID  . feeding supplement (VITAL HIGH PROTEIN)  1,000 mL Per Tube Q24H  . insulin aspart  0-9 Units Subcutaneous Q4H  . ipratropium-albuterol  3 mL Nebulization BID  . mouth rinse  15 mL Mouth Rinse 10 times per day  . pantoprazole sodium  40 mg Per Tube Daily  . sevelamer carbonate  2.4 g Oral TID WC   acetaminophen **OR** acetaminophen, fentaNYL, heparin, hydrALAZINE, ipratropium-albuterol, labetalol, midazolam, ondansetron **OR** ondansetron (ZOFRAN) IV, vecuronium  Assessment/ Plan:  [name] is a 49 y.o. Hispanic male with end stage renal disease, diabetes mellitus type I, hypertension, sleep apnea who was admitted to [facility] on 06/08/2019 for Hyperkalemia [E87.5] CVA (cerebral vascular accident) ([place]) [I63.9] Stroke ([place]) [I63.9] ESRD (end stage renal disease) on dialysis ([place]) [N18.6, Z99.2] Chest pain [R07.9]  [facility] MWF [address] RIJ 120kg  1. End stage renal disease on hemodialysis. Requiring CRRT from 12/21 to 12/24  Hemodialysis treatment 1/8. Tolerated treatment well.  Continue MWF schedule.    2. Acute Resp failure: requiring mechanical ventilation secondary to pneumonia.  - plan for tracheostomy as per family  3. Anemia of chronic kidney disease: Status post 2 units PRBC 1/9.  - EPO with HD treatment - Plan for PRBC transfusion for today.   4. Secondary Hyperparathyroidism with hypocalcemia and hyperphosphatemia.  - sevelamer  - calcitriol - Check PTH  5. Encephalopathy: appreciate neurology input. EEG scheduled.  BUN elevated - will dialyze today.    LOS: 23 Jake Gomez 15-Jan-20219:24 AM

## 2019-07-16 NOTE — Progress Notes (Addendum)
CRITICAL CARE PROGRESS NOTE    Name: Jake Gomez MRN: [id number] DOB: 10/22/1970     LOS: 25   SUBJECTIVE FINDINGS & SIGNIFICANT EVENTS   Patient description: Mr. Stetzer is a 49 yo male with a history of ESRD, OSA, and DMT2.  He presented to the ED on 06/01/2019 with hyperkalemia after missing 2 days of dialysis.  On workup, he was found to have an acute ischemic stroke.  He was admitted to the ICU for acute respiratory distress.  He also tested positive for COVID-19 on 12/28, and developed subsequent pneumonia in the left lower lobe.     Lines / Drains: Fistula left upper arm Hemodialysis catheter right IJ PIV x2: right forearm, right upper arm NG/OG tube Rectal pouch ET tube x 17 days on vent at FiO2 28%, PEEP 5   Cultures / Sepsis markers: Tracheal aspirate culture 06/11/2019 significant for few candida albicans.  WBC within normal range at 7.6. Afebrile.  Normal pulse at 73 bpm today.    Protocols / Consultants: Nephrology Palliative care  Tests / Events: 12/19: presented to the ED for malaise after missing 2 dialysis sessions. Potassium 6.4, Creatinine 15.4. Acute ischemic CVA, with negative MR angiogram.  12/20: CT angio chest demonstrates consolidation in the left lower lobe. No evidence of PE.  12/29Has become diaphoretic this AM with weaning fentanyl, and episodes of tachypnea over-riding ventilator. Meeting in person with family  12/30- relatively unchanged with hemineglect, s/p repeat MRI -increased size brain infarcts 1/1 - no impromevent in mentation, continuing plan for end of life care decisions with family. Repeat CXR today to eval interval changes post tx for asp pna. Repeat MRI discussed with family. 1/2-1/8: failure to wean off vent, severe brain damage, severe respiratory  failure. Unable to contact patient's family. Told they are in [place].  1/8 unable to wean from Vent 1/10 severe lung damage and severe brain damage  Overnight: Patient condition stable.  Continuing to require propofol and fentanyl.  Still awaiting family input regarding goals of care.   PAST MEDICAL HISTORY   Past Medical History:  Diagnosis Date  . Diabetes mellitus without complication ([place])   . Hypertension   . Renal disorder   . Sleep apnea    can't afford CPAP     SURGICAL HISTORY   Past Surgical History:  Procedure Laterality Date  . AV FISTULA PLACEMENT Left 01/31/2019   Procedure: ARTERIOVENOUS (AV) FISTULA CREATION ( BRACHIOCEPHALIC );  Surgeon: Algernon Huxley, MD;  Location: [facility];  Service: Vascular;  Laterality: Left;  . DIALYSIS/PERMA CATHETER INSERTION N/A 07/19/2018   Procedure: DIALYSIS/PERMA CATHETER INSERTION;  Surgeon: Algernon Huxley, MD;  Location: [facility];  Service: Cardiovascular;  Laterality: N/A;  . DIALYSIS/PERMA CATHETER INSERTION N/A 09/13/2018   Procedure: DIALYSIS/PERMA CATHETER INSERTION;  Surgeon: Katha Cabal, MD;  Location: [facility];  Service: Cardiovascular;  Laterality: N/A;  . DIALYSIS/PERMA CATHETER REMOVAL N/A 06/03/2019   Procedure: DIALYSIS/PERMA CATHETER REMOVAL, temporary dialysis catheter placement;  Surgeon: Algernon Huxley, MD;  Location: [facility];  Service: Cardiovascular;  Laterality: N/A;  . peritonal dialysis    . REMOVAL OF A DIALYSIS CATHETER N/A 07/17/2018   Procedure: REMOVAL OF A DIALYSIS CATHETER;  Surgeon: Algernon Huxley, MD;  Location: [facility];  Service: Vascular;  Laterality: N/A;     FAMILY HISTORY   Family History  Problem Relation Age of Onset  . Thyroid disease Mother   . Stroke Father   .  Cancer Father   . Diabetes Father      SOCIAL HISTORY   Social History   Tobacco Use  . Smoking status: Former Smoker    Quit date: 11/14/2009    Years since quitting: 9.6  .  Smokeless tobacco: Former Systems developer    Quit date: 07/16/2009  Substance Use Topics  . Alcohol use: No  . Drug use: Not Currently    Comment: history 10 years ago   No urine production     MEDICATIONS   Current Medication:  Current Facility-Administered Medications:  .   stroke: mapping our early stages of recovery book, , Does not apply, Once, Sharion Settler, NP .  0.9 %  sodium chloride infusion, 250 mL, Intravenous, Continuous, Wyvonnia Dusky, MD, Last Rate: 5 mL/hr at 07-22-19 0000, Rate Verify at 07-22-2019 0000 .  acetaminophen (TYLENOL) tablet 650 mg, 650 mg, Per Tube, Q6H PRN **OR** acetaminophen (TYLENOL) suppository 650 mg, 650 mg, Rectal, Q6H PRN, Gerald Dexter, RPH .  aspirin chewable tablet 324 mg, 324 mg, Per Tube, Daily, Flora Lipps, MD, 324 mg at 06/24/19 1201 .  atorvastatin (LIPITOR) tablet 80 mg, 80 mg, Per Tube, q morning - 10a, Gerald Dexter, RPH, 80 mg at 06/24/19 1707 .  B-complex with vitamin C tablet 1 tablet, 1 tablet, Per Tube, Daily, Flora Lipps, MD, 1 tablet at 06/24/19 1204 .  calcitRIOL (ROCALTROL) 1 MCG/ML solution 0.25 mcg, 0.25 mcg, Per Tube, Daily, Candiss Norse, [place], MD, 0.25 mcg at 06/24/19 1204 .  chlorhexidine gluconate (MEDLINE KIT) (PERIDEX) 0.12 % solution 15 mL, 15 mL, Mouth Rinse, BID, Teondre Jarosz, MD, 15 mL at 06/24/19 1931 .  Chlorhexidine Gluconate Cloth 2 % PADS 6 each, 6 each, Topical, BID, Flora Lipps, MD, 6 each at 06/24/19 1015 .  cholestyramine (QUESTRAN) packet 4 g, 4 g, Oral, BID, Tyler Pita, MD, 4 g at 06/24/19 2127 .  clopidogrel (PLAVIX) tablet 75 mg, 75 mg, Per Tube, Daily, Ottie Glazier, MD, 75 mg at 06/24/19 1203 .  epoetin alfa (EPOGEN) injection 10,000 Units, 10,000 Units, Intravenous, Q M,W,F-HD, Murlean Iba, MD, 10,000 Units at 06/22/19 1536 .  feeding supplement (PRO-STAT SUGAR FREE 64) liquid 60 mL, 60 mL, Per Tube, TID, Flora Lipps, MD, 60 mL at 06/24/19 2127 .  feeding supplement (VITAL HIGH  PROTEIN) liquid 1,000 mL, 1,000 mL, Per Tube, Q24H, Danisha Brassfield, MD, 1,000 mL at 06/24/19 1115 .  fentaNYL (SUBLIMAZE) bolus via infusion 50 mcg, 50 mcg, Intravenous, Q15 min PRN, Darel Hong D, NP, 50 mcg at 06/22/19 0933 .  fentaNYL 2514mg in NS 2561m(1052mml) infusion-PREMIX, 0-400 mcg/hr, Intravenous, Continuous, Seldon Barrell, MD, Last Rate: 35 mL/hr at 01/02-12-2112, 350 mcg/hr at 01/02-12-2112 .  heparin injection 1,000-6,000 Units, 1,000-6,000 Units, CRRT, PRN, KasFlora LippsD .  hydrALAZINE (APRESOLINE) injection 20 mg, 20 mg, Intravenous, Q4H PRN, Olivia Pavelko, MD .  insulin aspart (novoLOG) injection 0-9 Units, 0-9 Units, Subcutaneous, Q4H, KeeDarel Hong NP, 1 Units at 06/24/19 0448 .  ipratropium-albuterol (DUONEB) 0.5-2.5 (3) MG/3ML nebulizer solution 3 mL, 3 mL, Nebulization, Q6H PRN, WilWyvonnia DuskyD .  ipratropium-albuterol (DUONEB) 0.5-2.5 (3) MG/3ML nebulizer solution 3 mL, 3 mL, Nebulization, BID, Jarick Harkins, MD, 3 mL at 06/24/19 1905 .  labetalol (NORMODYNE) injection 10 mg, 10 mg, Intravenous, Q6H PRN, AleOttie GlazierD, 10 mg at 06/23/19 0801 .  levETIRAcetam (KEPPRA) IVPB 500 mg/100 mL premix, 500 mg, Intravenous, Q12H, KeeDarel Hong NP, Last Rate: 400 mL/hr  at 2019/06/26 0242, 500 mg at 06/26/2019 0242 .  MEDLINE mouth rinse, 15 mL, Mouth Rinse, 10 times per day, Flora Lipps, MD, 15 mL at 2019-06-26 0615 .  midazolam (VERSED) injection 2 mg, 2 mg, Intravenous, Q2H PRN, Darel Hong D, NP, 2 mg at 06/23/19 0801 .  norepinephrine (LEVOPHED) 16 mg in 236m premix infusion, 0-40 mcg/min, Intravenous, Titrated, BAwilda Bill NP, Stopped at 06/16/19 2312 .  ondansetron (ZOFRAN) tablet 4 mg, 4 mg, Per Tube, Q6H PRN **OR** ondansetron (ZOFRAN) injection 4 mg, 4 mg, Intravenous, Q6H PRN, MGerald Dexter RPH .  pantoprazole sodium (PROTONIX) 40 mg/20 mL oral suspension 40 mg, 40 mg, Per Tube, Daily, Aeon Kessner, MD, 40 mg at 06/24/19 1203 .  propofol  (DIPRIVAN) 1000 MG/100ML infusion, 5-80 mcg/kg/min, Intravenous, Titrated, GTyler Pita MD, Last Rate: 14.72 mL/hr at 001/12/20210617, 20 mcg/kg/min at 001-12-210617 .  sevelamer carbonate (RENVELA) powder PACK 2.4 g, 2.4 g, Oral, TID WC, Lateef, Munsoor, MD, 2.4 g at 06/24/19 1707 .  vecuronium (NORCURON) injection 10 mg, 10 mg, Intravenous, Q1H PRN, KFlora Lipps MD, 10 mg at 06/19/19 1042    ALLERGIES   Patient has no known allergies.    REVIEW OF SYSTEMS   Unable to obtain due to severity of patient illness.    PHYSICAL EXAMINATION   Vital Signs: Temp:  [98.3 F (36.8 C)-98.5 F (36.9 C)] 98.4 F (36.9 C) (01/11 0200) Pulse Rate:  [69-88] 73 (01/11 0300) Resp:  [11-19] 14 (01/11 0300) BP: (110-187)/(34-123) 144/41 (01/11 0300) SpO2:  [99 %-100 %] 100 % (01/11 0306) FiO2 (%):  [28 %] 28 % (01/11 0306)  GENERAL:Obtunded.  Patient frowns in response to noxious stimuli. HEAD: Normocephalic, atraumatic.  EYES: Pupils equal, round, reactive to light.  No scleral icterus. Corneal reflex intact bilaterally.  Eyes fixated up and to the left. MOUTH: Moist mucosal membrane. NECK: Supple. No thyromegaly. No nodules. No JVD.  PULMONARY: Rhonchi on left upper lobe, diminished lung sounds at left lung base.  Right clear to auscultation. On vent, maintaining SpO2 100% on FiO2 28% and PEEP 5.   CARDIOVASCULAR: S1 and S2. Regular rate and rhythm. No murmurs, rubs, or gallops.  GASTROINTESTINAL: Soft, nontender, non-distended. No masses. Positive bowel sounds. No hepatosplenomegaly.  MUSCULOSKELETAL: No swelling, clubbing, or edema.  NEUROLOGIC: Sedated.  Does not follow commands.  Corneal reflex intact bilaterally.  Cough reflex intact.   SKIN:intact,warm,dry   PERTINENT DATA     Infusions: . sodium chloride 5 mL/hr at 0Jan 12, 20210000  . fentaNYL infusion INTRAVENOUS 350 mcg/hr (02021-01-120514)  . levETIRAcetam 500 mg (0Jan 12, 20210242)  . norepinephrine (LEVOPHED) Adult infusion  Stopped (06/16/19 2312)  . propofol (DIPRIVAN) infusion 20 mcg/kg/min (001-12-20210617)   Scheduled Medications: .  stroke: mapping our early stages of recovery book   Does not apply Once  . aspirin  324 mg Per Tube Daily  . atorvastatin  80 mg Per Tube q morning - 10a  . B-complex with vitamin C  1 tablet Per Tube Daily  . calcitRIOL  0.25 mcg Per Tube Daily  . chlorhexidine gluconate (MEDLINE KIT)  15 mL Mouth Rinse BID  . Chlorhexidine Gluconate Cloth  6 each Topical BID  . cholestyramine  4 g Oral BID  . clopidogrel  75 mg Per Tube Daily  . epoetin (EPOGEN/PROCRIT) injection  10,000 Units Intravenous Q M,W,F-HD  . feeding supplement (PRO-STAT SUGAR FREE 64)  60 mL Per Tube TID  . feeding supplement (VITAL HIGH  PROTEIN)  1,000 mL Per Tube Q24H  . insulin aspart  0-9 Units Subcutaneous Q4H  . ipratropium-albuterol  3 mL Nebulization BID  . mouth rinse  15 mL Mouth Rinse 10 times per day  . pantoprazole sodium  40 mg Per Tube Daily  . sevelamer carbonate  2.4 g Oral TID WC   PRN Medications: acetaminophen **OR** acetaminophen, fentaNYL, heparin, hydrALAZINE, ipratropium-albuterol, labetalol, midazolam, ondansetron **OR** ondansetron (ZOFRAN) IV, vecuronium Hemodynamic parameters:   Intake/Output: 01/10 0701 - 01/11 0700 In: 1070.9 [I.V.:1070.9] Out: -   Ventilator  Settings: Vent Mode: PCV FiO2 (%):  [28 %] 28 % Set Rate:  [16 bmp] 16 bmp PEEP:  [5 cmH20] 5 cmH20 Plateau Pressure:  [16 cmH20-19 cmH20] 19 cmH20   Other Labs:  LAB RESULTS:  Basic Metabolic Panel: Recent Labs  Lab 06/20/19 0445 06/21/19 0014 06/21/19 0432 06/22/19 0801 06/23/19 0535 06/24/19 0447  NA 132*  --  134* 131* 133* 132*  K 4.9  --  4.5 5.4* 4.3 4.6  CL 93*  --  96* 94* 94* 93*  CO2 24  --  '25 23 26 25  '$ GLUCOSE 180*  --  173* 170* 143* 129*  BUN 138*  --  112* 132* 76* 96*  CREATININE 8.52*  --  6.73* 7.93* 5.19* 6.46*  CALCIUM 7.1*  --  7.4* 7.1* 7.8* 7.9*  MG 2.1  --   --   --   --   2.0  PHOS 9.0* 7.4* 7.1* 8.0* 5.3*  --    Liver Function Tests: Recent Labs  Lab 06/22/19 0801 06/23/19 0535  ALBUMIN 1.9* 2.1*   No results for input(s): LIPASE, AMYLASE in the last 168 hours. No results for input(s): AMMONIA in the last 168 hours. CBC: Recent Labs  Lab 06/19/19 0538 06/20/19 0445 06/21/19 0432 06/21/19 1856 06/22/19 0801 06/23/19 0535 06/24/19 0447 2019/07/09 0636  WBC 6.0 8.7 7.3  --  7.7 8.0 8.1 7.6  NEUTROABS 4.1 5.9 5.2  --   --   --   --  5.2  HGB 8.6* 7.5* 7.4* 7.5* 6.8* 6.8* 7.4* 6.9*  HCT 27.8* 24.4* 23.8* 24.3* 21.6* 22.0* 23.8* 22.4*  MCV 99.6 99.6 99.2  --  98.2 99.5 98.3 97.8  PLT 301 327 286  --  273 278 257 222   Cardiac Enzymes: No results for input(s): CKTOTAL, CKMB, CKMBINDEX, TROPONINI in the last 168 hours. BNP: Invalid input(s): POCBNP CBG: Recent Labs  Lab 06/24/19 1654 06/24/19 1927 06/24/19 2357 2019-07-09 0415 07/09/2019 0755  GLUCAP 103* 95 104* 86 109*     IMAGING RESULTS:   ASSESSMENT AND PLAN    -Multidisciplinary rounds held today  Acute Hypoxic Respiratory Failure due to pneumonia and s/p CVA -continue Full MV support, patient continues to require FiO2 28%, PEEP 5 -continue Bronchodilator Therapy with duoneb -Wean Fio2 and PEEP as tolerated -will perform SAT/SBT when respiratory parameters are met  Has had several wean attempts and has failed vent weaning trials due to severe resp muscle fatigue and unable to protect airway  S/P Acute ischemic stroke - Patient remains obtunded.  Does not follow commands. Eyes fixated up and to the left.  PERRL.  Corneal reflex and gag reflex intact.   - Under care of neurology.  Appreciate their input.   - MRI brain on 12/30 consistent with infarcts in the bilateral centrum semiovale, corona radiata and periventricular white matter.   - Intubated and sedated, unable to wean from vent and fentanyl/propofol.  Patient becomes  hypertensive and overrides vent with less sedation.    - continue aspirin, clopidogrel, keppra, plavix  End Stage renal disease - BUN 111 today, potassium 5.2. - Under care of nephrology-appreciate their input.  Patient will undergo hemodialysis later today.   -follow chem 7 -Patient does not make urine  Goals of Care - Family meeting with patient's sister yesterday.  Stated that she did not want to "prolong suffering".  Wanted to discuss possible extubation and comfort care with patient's family, and will report back today.    GI/Nutrition GI PROPHYLAXIS as indicated DIET-->TF's as tolerated No stool since 06/20/2018, consider adding dulcolax today.  Anemia - Chronic issue due to ESRD.  On epoietin alfa.  - Last transfusion 06/22/2018. - Hgb back down to 6.9 today.  Consider additional transfusion.  Hypertension - Continue labetalol as needed.    ENDO - ICU hypoglycemic\Hyperglycemia protocol -check FSBS per protocol  - Continue insulin aspart sliding scale  ELECTROLYTES -follow labs as needed -replace as needed -pharmacy consultation   DVT/GI PRX ordered -SCDs  Plavix 75 mg QD     Critical Care Time devoted to patient care services described in this note is 35 minutes.   Overall, patient is critically ill, prognosis is guarded.  Patient with Multiorgan failure and at high risk for cardiac arrest and death.   Multiorgan failure poor prognosis Recommend DNR status Will need TRACH and PEG tube for survival Appreciate palliative care consult team to follow     Corrin Parker, M.D.  [name]  Medical Director [place] Director [facility] Cardio-Pulmonary Department

## 2019-07-16 NOTE — Progress Notes (Signed)
CH encountered Pt.'s sister Marliss Coots) while rounding w/CH [employer] Shortly afterward, Dr. Mortimer Fries discussed Pt.'s care w/sister; sister expressed that pt. Is 'independent'; did not think pt. Would want to be permanently dependent on life support.  Decision made for breathing tube to be removed after a priest could come perform sacrament of the sick.  [place] contacted [facility]; pt. Received SOS from priest at approx. 3pm.   CH present in pt.'s room in ministry of presence w/sister, pt.'s niece, and pt.'s dtr. While they said their goodbyes; other family members spoke to pt. Via video calls.  Pt. Extubated after priest left; pt. Died shortly afterwards.  [place] offered family a prayer shawl as gesture of condolence.  Family expressed gratitude to chaplain and medical staff.      2019-07-02 1500  Clinical Encounter Type  Visited With Patient and family together;Health care provider  Visit Type Spiritual support;Psychological support;Critical Care;Death;Patient actively dying;Social support  Referral From Chaplain;Nurse  Spiritual Encounters  Spiritual Needs Prayer;Ritual;Emotional;Grief support  Stress Factors  Family Stress Factors Health changes;Loss;Family relationships;Loss of control

## 2019-07-16 NOTE — Progress Notes (Signed)
Pt was extubated to 2 LPM. Time of death was 1513. CDS was contacted, see postmortem flowsheet.

## 2019-07-16 NOTE — Progress Notes (Signed)
Pt. Extubated.

## 2019-07-16 NOTE — Progress Notes (Signed)
Nutrition Brief Follow-Up Note  Chart reviewed. Patient now transitioning to comfort care.   No further nutrition interventions warranted at this time. Please re-consult RD as needed.   Dyana Magner King, MS, RD, LDN Office: [phone] Pager: [phone] After Hours/Weekend Pager: [phone] 

## 2019-07-16 NOTE — Plan of Care (Signed)
  Problem: Clinical Measurements: Goal: Ability to maintain clinical measurements within normal limits will improve Outcome: Progressing Goal: Will remain free from infection Outcome: Progressing Goal: Diagnostic test results will improve Outcome: Progressing Goal: Respiratory complications will improve Outcome: Progressing Goal: Cardiovascular complication will be avoided Outcome: Progressing  HD Tx canceled, Pt is on comfort care. Primary RN, nephrologist and Primary physician confirmed that Pt no longer needs dialysis since comfort care management has been initiated.

## 2019-07-16 NOTE — Progress Notes (Signed)
Family At bedside, clinical status relayed to family  Updated and notified of patients medical condition-  Progressive multiorgan failure with very low chance of meaningful recovery.  PATIENT WAS TIRED OF HD AND ALL THE COMPLICATIONS FROM IT  PATIENT WOULD NOT WANT TRACH AND FEEDING TUBE HE WOULD NOT WANT LTACH OR NH  PATIENT HAS SUFFERED LONG ENOUGH  Family understands the situation.  They have consented and agreed to DNR/DNI and would like to proceed with Comfort care measures.   Family are satisfied with Plan of action and management. All questions answered  Corrin Parker, M.D.  [name]  Medical Director [place] Director [facility] Cardio-Pulmonary Department

## 2019-07-16 NOTE — Death Summary Note (Signed)
DEATH SUMMARY   Patient Details  Name: Jake Gomez MRN: [id number] DOB: 1970/08/03  Admission/Discharge Information   Admit Date:  June 06, 2019  Date of Death:   06/29/19   Time of Death:  1511/09/10  Length of Stay: September 08, 2022  Referring Physician: Juline Patch, MD   Patient description: Jake Gomez is a 49 yo male with a history of ESRD, OSA, and DMT2.  He presented to the ED on 06-06-19 with hyperkalemia after missing 2 days of dialysis.  On workup, he was found to have an acute ischemic stroke.  He was admitted to the ICU for acute respiratory distress.  He also tested positive for COVID-19 on 12/28, and developed subsequent pneumonia in the left lower lobe.     Lines / Drains: Fistula left upper arm Hemodialysis catheter right IJ PIV x2: right forearm, right upper arm NG/OG tube Rectal pouch ET tube x 17 days on vent at FiO2 28%, PEEP 5   Cultures / Sepsis markers: Tracheal aspirate culture 06/11/2019 significant for few candida albicans.  WBC within normal range at 7.6. Afebrile.  Normal pulse at 73 bpm today.    Protocols / Consultants: Nephrology Palliative care  Tests / Events: 06/06/23: presented to the ED for malaise after missing 2 dialysis sessions. Potassium 6.4, Creatinine 15.4. Acute ischemic CVA, with negative MR angiogram.  12/20: CT angio chest demonstrates consolidation in the left lower lobe. No evidence of PE.  12/29Has become diaphoretic this AM with weaning fentanyl, and episodes of tachypnea over-riding ventilator. Meeting in person with family  12/30- relatively unchanged with hemineglect, s/p repeat MRI -increased size brain infarcts 1/1 - no impromevent in mentation, continuing plan for end of life care decisions with family. Repeat CXR today to eval interval changes post tx for asp pna. Repeat MRI discussed with family. 1/2-1/8: failure to wean off vent, severe brain damage, severe respiratory failure. Unable to contact patient's family.  Told they are in [place].  1/8 unable to wean from Vent 1/10 severe lung damage and severe brain damage  Family At bedside, clinical status relayed to family  Updated and notified of patients medical condition-  Progressive multiorgan failure with very low chance of meaningful recovery.  Patient is in dying  Process.  Family understands the situation.  They have consented and agreed to DNR/DNI and would like to proceed with Comfort care measures.   Family are satisfied with Plan of action and management. All questions answered      Diagnoses  Preliminary cause of death: PNEUMONIA, COVID 19 infection, CVA, DM,ESRD ON HD Secondary Diagnoses (including complications and co-morbidities):  Active Problems:   Hyperkalemia   Thrombocytopenia ([facility])   CVA (cerebral vascular accident) (Jake Gomez)   Encounter for intubation   Pressure injury of skin   Acute respiratory failure ([place])   Endotracheally intubated    Pertinent Labs and Studies  Significant Diagnostic Studies EEG  Result Date: 06/14/2019 Jake Goodell, MD     06/14/2019  2:46 PM ELECTROENCEPHALOGRAM REPORT Patient: Jake Gomez       Room #: IC16A-AA EEG No. ID: [id number] Age: 49 y.o.        Sex: male Referring Physician: Dajanae Gomez Report Date:  06/14/2019       Interpreting Physician: Jake Gomez History: Jake Gomez is an 49 y.o. male with altered mental status and eye deviation Medications: ASA, Lipitor, Plavix, Epogen, Insulin, Keppra, Renvela, Fentanyl Conditions of Recording:  This is a 21 channel routine scalp EEG performed with bipolar and monopolar montages arranged  in accordance to the international 10/20 system of electrode placement. One channel was dedicated to EKG recording. The patient is in the intubated and sedated state. Description:  Artifact is prominent during the recording producing a fast beta activity that is superimposed on a slow background that consists of a very low voltage polymorphic delta  activity.  This is diffusely distributed and persistent throughout the recording.  No epileptiform activity is noted. Hyperventilation and intermittent photic stimulation were not performed. IMPRESSION: This is an abnormal EEG secondary to general background slowing.  This finding may be seen with a diffuse disturbance that is etiologically nonspecific, but may include a metabolic encephalopathy or medication effect, among other possibilities.  No epileptiform activity is noted.  Jake Goodell, MD Neurology [phone] 06/14/2019, 2:32 PM   EEG  Result Date: 06/06/2019 Jake Havens, MD     06/06/2019  9:42 AM Patient Name: Jake Gomez MRN: [id number] Epilepsy Attending: Lora Gomez Referring Physician/Provider: Dr. Alexis Gomez Date: 06/05/2019 Duration: 22.30 minutes Patient history: 49 year old male who presented with an episode of syncope and seizure-like activity.  EEG to evaluate for seizures. Level of alertness: Comatose/sedated AEDs during EEG study: None Technical aspects: This EEG study was done with scalp electrodes positioned according to the 10-20 International system of electrode placement. Electrical activity was acquired at a sampling rate of 500Hz  and reviewed with a high frequency filter of 70Hz  and a low frequency filter of 1Hz . EEG data were recorded continuously and digitally stored. Description: EEG showed continuous generalized 3 to 6 Hz theta-delta slowing.  EEG was reactive to tactile stimulation.  Hyperventilation photic stimulation were not performed. Abnormality -Continuous slow, generalized IMPRESSION: This study is suggestive of moderate to severe diffuse encephalopathy, nonspecific etiology.  No seizures or epileptiform discharges were seen throughout the recording.   DG Chest 1 View  Result Date: 05/25/2019 CLINICAL DATA:  Chest pain.  Missed dialysis. EXAM: CHEST  1 VIEW COMPARISON:  08/16/2018 FINDINGS: Right-sided dialysis catheter tip at the atrial caval  junction. Unchanged cardiomegaly. Left pleural effusion versus pleural thickening and scarring, unchanged from prior exam. Overall low lung volumes. Probable vascular congestion. No pneumothorax. IMPRESSION: Low lung volumes. Cardiomegaly with probable vascular congestion. Chronic left pleural effusion/thickening and basilar scarring. Electronically Signed   By: Keith Rake M.D.   On: 05/27/2019 02:40   DG Abd 1 View  Result Date: 06/21/2019 CLINICAL DATA:  49 year old male enteric tube placement. EXAM: ABDOMEN - 1 VIEW COMPARISON:  Portable abdomen 05/25/2019 and earlier. FINDINGS: Portable AP semi upright view at 0954 hours. Enteric tube terminates in the distal gastric body, side hole the level of the gastric body. Stable visible lung bases. Negative visible bowel gas pattern. No acute osseous abnormality identified. IMPRESSION: Enteric tube in the stomach, side hole at the level of the gastric body. Electronically Signed   By: Genevie Ann M.D.   On: 06/21/2019 10:29   DG Abd 1 View  Result Date: 06/11/2019 CLINICAL DATA:  OG tube placement EXAM: ABDOMEN - 1 VIEW COMPARISON:  None. FINDINGS: OG tube tip is at the GE junction with the side port in the distal esophagus. Nonobstructive bowel gas pattern. IMPRESSION: OG tube tip at the GE junction. This could be advanced several cm into the stomach. Electronically Signed   By: Rolm Baptise M.D.   On: 05/22/2019 12:19   CT Head Wo Contrast  Result Date: 06/06/2019 CLINICAL DATA:  49 year old male with syncope. Patient with end-stage renal disease on dialysis. EXAM:  CT HEAD WITHOUT CONTRAST TECHNIQUE: Contiguous axial images were obtained from the base of the skull through the vertex without intravenous contrast. COMPARISON:  11/17/2010 CT FINDINGS: Brain: No evidence of acute infarction, hemorrhage, hydrocephalus, extra-axial collection or mass lesion/mass effect. Vascular: Mild carotid atherosclerotic calcifications noted. Skull: Normal. Negative for  fracture or focal lesion. Sinuses/Orbits: No acute finding. Other: None. IMPRESSION: No evidence of acute intracranial abnormality. Electronically Signed   By: Margarette Canada M.D.   On: 06/11/2019 20:20   CT ANGIO CHEST PE W OR WO CONTRAST  Result Date: 06/03/2019 CLINICAL DATA:  Shortness of breath. Missed recent dialysis sessions. EXAM: CT ANGIOGRAPHY CHEST WITH CONTRAST TECHNIQUE: Multidetector CT imaging of the chest was performed using the standard protocol during bolus administration of intravenous contrast. Multiplanar CT image reconstructions and MIPs were obtained to evaluate the vascular anatomy. CONTRAST:  141mL OMNIPAQUE IOHEXOL 350 MG/ML SOLN COMPARISON:  Chest x-ray from yesterday. FINDINGS: Cardiovascular: Suboptimal evaluation of the segmental pulmonary arteries due to contrast bolus timing and motion artifact. No evidence of central or lobar pulmonary embolism. Unchanged mild cardiomegaly. No pericardial effusion. No thoracic aortic aneurysm or dissection. Coronary, aortic arch, and branch vessel atherosclerotic vascular disease. Dense calcification of the mitral annulus. Mediastinum/Nodes: No enlarged mediastinal, hilar, or axillary lymph nodes. Thyroid gland, trachea, and esophagus demonstrate no significant findings. Lungs/Pleura: Trace left pleural effusion. Consolidation atelectasis in the left lower lobe. Minimal subsegmental atelectasis in the right lower lobe. Chronic pleuroparenchymal scarring in the left upper lobe. No pneumothorax. Upper Abdomen: No acute abnormality. Musculoskeletal: No chest wall abnormality. No acute or significant osseous findings. Review of the MIP images confirms the above findings. IMPRESSION: 1. No central or lobar pulmonary embolism. Suboptimal opacification of the segmental pulmonary arteries due to contrast bolus timing and motion artifact. 2. Left lower lobe pneumonia.  Trace left pleural effusion. 3.  Aortic atherosclerosis (ICD10-I70.0). Electronically  Signed   By: Titus Dubin M.D.   On: 06/03/2019 12:34   MR ANGIO HEAD WO CONTRAST  Result Date: 06/03/2019 CLINICAL DATA:  Initial evaluation for acute altered mental status. EXAM: MRI HEAD WITHOUT CONTRAST MRA HEAD WITHOUT CONTRAST TECHNIQUE: Multiplanar, multiecho pulse sequences of the brain and surrounding structures were obtained without intravenous contrast. Angiographic images of the head were obtained using MRA technique without contrast. COMPARISON:  Prior CT from 05/29/2019. FINDINGS: MRI HEAD FINDINGS Brain: Examination moderately degraded by motion artifact. Cerebral volume within normal limits for age. Patchy multifocal areas of restricted diffusion seen involving the periventricular white matter of the corona radiata and centrum semi ovale bilaterally, right slightly worse than left. Largest area of infarction seen on the right and measures up to 2.1 cm. No associated hemorrhage or mass effect. No other evidence for acute or subacute ischemia. Gray-white matter differentiation otherwise maintained. No encephalomalacia to suggest chronic cortical infarction. 8 mm focus of susceptibility artifact noted at the periventricular white matter of the right centrum semi ovale, consistent with a chronic microhemorrhage. No mass lesion, midline shift or mass effect. No hydrocephalus. No extra-axial fluid collection. No obvious intrinsic temporal lobe abnormality. Pituitary gland suprasellar region normal. Midline structures intact. Vascular: Major intracranial vascular flow voids grossly maintained at the skull base. Skull and upper cervical spine: Craniocervical junction normal. Upper cervical spine within normal limits. Bone marrow signal intensity normal. No scalp soft tissue abnormality. Sinuses/Orbits: Globes and orbital soft tissues within normal limits. Paranasal sinuses are largely clear. No significant mastoid effusion. Inner ear structures grossly normal. Other: None. MRA HEAD FINDINGS  ANTERIOR  CIRCULATION: Examination moderately degraded by motion artifact. Visualized distal cervical segments of the internal carotid arteries are widely patent with symmetric antegrade flow. Petrous, cavernous, and supraclinoid ICAs widely patent without appreciable flow-limiting stenosis. A1 segments patent bilaterally. Normal anterior communicating artery. Anterior cerebral arteries markedly limited assessment due to motion, but are grossly patent to their distal aspects. No M1 stenosis or occlusion. Negative MCA bifurcations. Distal MCA branches markedly limited in assessment due to motion, but are grossly perfused and symmetric. POSTERIOR CIRCULATION: Vertebral arteries patent to the vertebrobasilar junction without stenosis. Left vertebral artery dominant. Patent right PICA. Left PICA not seen. Basilar widely patent to its distal aspect without stenosis. Superior cerebral arteries patent bilaterally. Right PCA primarily supplied via the basilar. Fetal type origin left PCA. Both PCAs well perfused to their distal aspects without stenosis. No appreciable aneurysm or other vascular abnormality. IMPRESSION: MRI HEAD IMPRESSION: 1. Motion degraded exam. 2. Patchy multifocal acute ischemic nonhemorrhagic infarcts involving the periventricular white matter of the corona radiata and centrum semi ovale bilaterally. No significant mass effect. 3. Otherwise grossly normal brain MRI for age. MRA HEAD IMPRESSION: 1. Technically limited exam due to motion. 2. Grossly negative intracranial MRA. No large vessel occlusion, hemodynamically significant stenosis, or other acute vascular abnormality. Electronically Signed   By: Jeannine Boga M.D.   On: 06/03/2019 00:51   MR BRAIN WO CONTRAST  Result Date: 06/13/2019 CLINICAL DATA:  Focal neuro deficit, greater than 6 hours, stroke suspected. Additional history provided: Acute ischemic stroke within cephalopathy. EXAM: MRI HEAD WITHOUT CONTRAST TECHNIQUE: Multiplanar, multiecho  pulse sequences of the brain and surrounding structures were obtained without intravenous contrast. COMPARISON:  MRI/MRA head 06/13/2019 FINDINGS: Brain: Multiple sequences are mildly motion degraded. Again demonstrated are patchy acute/early subacute ischemic infarcts within the bilateral centrum semiovale, corona radiata and periventricular white matter. Infarction changes have increased in extent as compared to prior examination 05/28/2019. For instance, infarction changes in the bilateral centrum semiovale/corona radiata appear more confluent. Additionally, there are several small new infarcts within the periatrial white matter bilaterally. Corresponding T2/FLAIR hyperintensity at these sites. Redemonstrated small focus of chronic microhemorrhage within the right centrum semiovale. No midline shift or extra-axial fluid collection. Cerebral volume is normal for age. Vascular: Flow voids maintained within the proximal large arterial vessels. Skull and upper cervical spine: No focal marrow lesion Sinuses/Orbits: Visualized orbits demonstrate no acute abnormality. Mild scattered paranasal sinus mucosal thickening. Trace fluid within bilateral mastoid air cells (greater on the right). IMPRESSION: Again demonstrated are patchy acute/early subacute infarcts within the bilateral centrum semiovale, corona radiata and periventricular white matter. These infarcts have increased in extent since prior MRI 05/27/2019, as described. No significant mass effect or midline shift. Electronically Signed   By: Kellie Simmering DO   On: 06/13/2019 14:51   MR BRAIN WO CONTRAST  Result Date: 06/03/2019 CLINICAL DATA:  Initial evaluation for acute altered mental status. EXAM: MRI HEAD WITHOUT CONTRAST MRA HEAD WITHOUT CONTRAST TECHNIQUE: Multiplanar, multiecho pulse sequences of the brain and surrounding structures were obtained without intravenous contrast. Angiographic images of the head were obtained using MRA technique without  contrast. COMPARISON:  Prior CT from 06/06/2019. FINDINGS: MRI HEAD FINDINGS Brain: Examination moderately degraded by motion artifact. Cerebral volume within normal limits for age. Patchy multifocal areas of restricted diffusion seen involving the periventricular white matter of the corona radiata and centrum semi ovale bilaterally, right slightly worse than left. Largest area of infarction seen on the right and measures up to 2.1  cm. No associated hemorrhage or mass effect. No other evidence for acute or subacute ischemia. Gray-white matter differentiation otherwise maintained. No encephalomalacia to suggest chronic cortical infarction. 8 mm focus of susceptibility artifact noted at the periventricular white matter of the right centrum semi ovale, consistent with a chronic microhemorrhage. No mass lesion, midline shift or mass effect. No hydrocephalus. No extra-axial fluid collection. No obvious intrinsic temporal lobe abnormality. Pituitary gland suprasellar region normal. Midline structures intact. Vascular: Major intracranial vascular flow voids grossly maintained at the skull base. Skull and upper cervical spine: Craniocervical junction normal. Upper cervical spine within normal limits. Bone marrow signal intensity normal. No scalp soft tissue abnormality. Sinuses/Orbits: Globes and orbital soft tissues within normal limits. Paranasal sinuses are largely clear. No significant mastoid effusion. Inner ear structures grossly normal. Other: None. MRA HEAD FINDINGS ANTERIOR CIRCULATION: Examination moderately degraded by motion artifact. Visualized distal cervical segments of the internal carotid arteries are widely patent with symmetric antegrade flow. Petrous, cavernous, and supraclinoid ICAs widely patent without appreciable flow-limiting stenosis. A1 segments patent bilaterally. Normal anterior communicating artery. Anterior cerebral arteries markedly limited assessment due to motion, but are grossly patent to  their distal aspects. No M1 stenosis or occlusion. Negative MCA bifurcations. Distal MCA branches markedly limited in assessment due to motion, but are grossly perfused and symmetric. POSTERIOR CIRCULATION: Vertebral arteries patent to the vertebrobasilar junction without stenosis. Left vertebral artery dominant. Patent right PICA. Left PICA not seen. Basilar widely patent to its distal aspect without stenosis. Superior cerebral arteries patent bilaterally. Right PCA primarily supplied via the basilar. Fetal type origin left PCA. Both PCAs well perfused to their distal aspects without stenosis. No appreciable aneurysm or other vascular abnormality. IMPRESSION: MRI HEAD IMPRESSION: 1. Motion degraded exam. 2. Patchy multifocal acute ischemic nonhemorrhagic infarcts involving the periventricular white matter of the corona radiata and centrum semi ovale bilaterally. No significant mass effect. 3. Otherwise grossly normal brain MRI for age. MRA HEAD IMPRESSION: 1. Technically limited exam due to motion. 2. Grossly negative intracranial MRA. No large vessel occlusion, hemodynamically significant stenosis, or other acute vascular abnormality. Electronically Signed   By: Jeannine Boga M.D.   On: 06/03/2019 00:51   US Carotid Bilateral (at [facility] and [facility] only)  Result Date: 06/03/2019 CLINICAL DATA:  49 year old male with acute altered mental status, possible stroke EXAM: BILATERAL CAROTID DUPLEX ULTRASOUND TECHNIQUE: Pearline Cables scale imaging, color Doppler and duplex ultrasound were performed of bilateral carotid and vertebral arteries in the neck. COMPARISON:  Brain MRI 06/03/2019 FINDINGS: Criteria: Quantification of carotid stenosis is based on velocity parameters that correlate the residual internal carotid diameter with NASCET-based stenosis levels, using the diameter of the distal internal carotid lumen as the denominator for stenosis measurement. The following velocity measurements were obtained: RIGHT ICA:  103/36 cm/sec CCA: [id number] cm/sec SYSTOLIC ICA/CCA RATIO:  1.1 ECA:  171 cm/sec LEFT ICA: 79/24 cm/sec CCA: [id number] cm/sec SYSTOLIC ICA/CCA RATIO:  0.6 ECA:  106 cm/sec RIGHT CAROTID ARTERY: Mild heterogeneous atherosclerotic plaque in the proximal internal carotid artery. By peak systolic velocity criteria, the estimated stenosis remains less than 50%. RIGHT VERTEBRAL ARTERY:  Patent with normal antegrade flow. LEFT CAROTID ARTERY: No significant atherosclerotic plaque or evidence of stenosis in the internal carotid artery. LEFT VERTEBRAL ARTERY:  Patent with normal antegrade flow. IMPRESSION: Mild (1-49%) stenosis proximal right internal carotid artery secondary to heterogenous atherosclerotic plaque. No significant atherosclerotic plaque or evidence of stenosis in the left internal carotid artery. The vertebral arteries are patent with normal  antegrade flow. Electronically Signed   By: Jacqulynn Cadet M.D.   On: 06/03/2019 10:40   PERIPHERAL VASCULAR CATHETERIZATION  Result Date: 05/26/2019 See op note  DG Chest Port 1 View  Result Date: 06/22/2019 CLINICAL DATA:  49 year old male with respiratory failure EXAM: PORTABLE CHEST 1 VIEW COMPARISON:  06/15/2019 FINDINGS: Cardiomediastinal silhouette unchanged in size and contour with cardiomegaly. Endotracheal tube terminates at the carina. The apical lordotic positioning may exaggerate the position, however, withdrawal of approximately 3.7 cm may position this better at the clavicular heads. Gastric tube terminates out of the field of view. Low lung volumes with patchy opacities bilaterally worst on the left, similar to the comparison. No pneumothorax. Blunting of the left costophrenic angle. IMPRESSION: Endotracheal tube terminates at the carina, and may be better positioned with withdrawing 3-4 cm. Gastric tube terminates out of the field of view. Persistently low lung volumes with bilateral mixed interstitial and airspace opacities and likely small left  pleural effusion. Electronically Signed   By: Corrie Mckusick D.O.   On: 06/22/2019 14:17   DG Chest Port 1 View  Result Date: 06/15/2019 CLINICAL DATA:  Pulmonary disease. EXAM: PORTABLE CHEST 1 VIEW COMPARISON:  June 13, 2019. FINDINGS: Stable cardiomediastinal silhouette. Endotracheal and nasogastric tubes are unchanged in position. No pneumothorax is noted. Hypoinflation of the lungs is noted. Mild left basilar atelectasis or infiltrate is noted with probable small pleural effusion. Minimal right basilar subsegmental atelectasis is noted. Bony thorax is unremarkable. IMPRESSION: Stable support apparatus. Mild left basilar atelectasis or infiltrate is noted with probable small pleural effusion. Minimal right basilar subsegmental atelectasis is noted. Electronically Signed   By: Marijo Conception M.D.   On: 06/15/2019 14:48   DG Chest Port 1 View  Result Date: 06/13/2019 CLINICAL DATA:  Acute respiratory failure. EXAM: PORTABLE CHEST 1 VIEW COMPARISON:  June 08, 2019. FINDINGS: Stable cardiomediastinal silhouette. Endotracheal and nasogastric tubes are unchanged in position. No pneumothorax is noted. Hypoinflation of the lungs is noted. Mildly increased bilateral lung opacities are noted concerning for atelectasis or possibly infiltrates. Bony thorax is unremarkable. Small left pleural effusion is noted. IMPRESSION: Stable support apparatus. Hypoinflation of the lungs. Mildly increased bilateral lung opacities are noted concerning for atelectasis or possibly infiltrates. Small left pleural effusion is noted. Electronically Signed   By: Marijo Conception M.D.   On: 06/13/2019 07:52   DG Chest Port 1 View  Result Date: 06/08/2019 CLINICAL DATA:  Intubation EXAM: PORTABLE CHEST 1 VIEW COMPARISON:  06/07/2019 at 5:41 a.m. FINDINGS: Support Apparatus: --Endotracheal tube: Tip at the level of the clavicular heads. --Enteric tube:Tip and sideport project over the stomach. --Catheter(s):None --Other: None  Unchanged small left pleural effusion with basilar atelectasis. IMPRESSION: 1. Unchanged small left pleural effusion and basilar atelectasis. 2. Unchanged position of endotracheal tube. Electronically Signed   By: Ulyses Jarred M.D.   On: 06/08/2019 01:17   DG Chest Port 1 View  Result Date: 06/07/2019 CLINICAL DATA:  Acute respiratory failure. EXAM: PORTABLE CHEST 1 VIEW COMPARISON:  06/08/2019 FINDINGS: Endotracheal tube unchanged. Nasogastric tube is coiled once over the stomach with tip in the gastric fundus. Lungs are hypoinflated with stable opacification over the left mid to lower lung likely small effusion with atelectasis. Right lung is clear. Cardiomediastinal silhouette and remainder of the exam is unchanged. IMPRESSION: Hypoinflation with stable mild opacification over the left mid to lower lung likely atelectasis and small amount of left pleural fluid. Tubes and lines as described. Electronically Signed  By: Marin Olp M.D.   On: 06/07/2019 07:40   DG CHEST PORT 1 VIEW  Result Date: 05/20/2019 CLINICAL DATA:  OG tube placement.  Intubation. EXAM: PORTABLE CHEST 1 VIEW COMPARISON:  06/03/2019. FINDINGS: Endotracheal tube noted with its tip 4 cm above the carina. NG tube noted with tip below left hemidiaphragm. Dual-lumen right IJ catheter with tip over SVC. Mild infiltrates noted over the left lung and over the right lung base. No significant interim change. Small left pleural effusion again noted. No pneumothorax. IMPRESSION: 1. Endotracheal tube noted with tip 4 cm above the carina. NG tube noted with tip below left hemidiaphragm. Dual-lumen right IJ catheter with tip over SVC. 2. Mild infiltrates noted over the left lung and over the right lung base. No significant interim change. Small left pleural effusion again noted. Electronically Signed   By: Marcello Moores  Register   On: 06/01/2019 12:20   DG Chest Portable 1 View  Result Date: 05/31/2019 CLINICAL DATA:  Loss of consciousness EXAM:  PORTABLE CHEST 1 VIEW COMPARISON:  June 02, 2019 FINDINGS: Again noted is mild cardiomegaly. A right-sided central venous catheter seen with the tip at the superior cavoatrial junction. There is prominence of the pulmonary vasculature. There is mildly increased hazy airspace opacity seen at the left lung base with pleural thickening. No acute osseous abnormality. 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 IMPRESSION: Pulmonary vascular congestion and mild cardiomegaly. Unchanged chronic hazy airspace opacity at the left lung base/pleural thickening. Electronically Signed   By: Prudencio Pair M.D.   On: 05/31/2019 20:04   ECHOCARDIOGRAM COMPLETE  Result Date: 06/06/2019   ECHOCARDIOGRAM REPORT   Patient Name:   KAMERAN LASSWELL Date of Exam: 06/06/2019 Medical Rec #:  [id number]    Height:       68.0 in Accession #:    [id number]   Weight:       272.7 lb Date of Birth:  1970-09-05    BSA:          2.33 m Patient Age:    43 years     BP:           124/49 mmHg Patient Gender: M            HR:           84 bpm. Exam Location:  [facility] Procedure: 2D Echo, Color Doppler, Cardiac Doppler and Intracardiac            Opacification Agent Indications:     R06.89 Acute Respiratory Insufficiency  History:         Patient has prior history of Echocardiogram examinations, most                  recent 06/03/2019. Renal disease; Risk Factors:Hypertension and                  Diabetes.  Sonographer:     Charmayne Sheer RDCS (AE) Referring Phys:  [id number] Flora Lipps Diagnosing Phys: Ida Rogue MD  Sonographer Comments: Echo performed with patient supine and on artificial respirator and Technically difficult study due to poor echo windows. Image acquisition challenging due to patient body habitus. IMPRESSIONS  1. Left ventricular ejection fraction, by visual estimation, is 60 to 65%. The left ventricle has normal function. There is no left ventricular hypertrophy.  2. Left ventricular diastolic parameters are consistent with Grade I diastolic  dysfunction (impaired relaxation).  3. The left ventricle has no regional wall motion abnormalities.  4. Global right ventricle has  normal systolic function.The right ventricular size is normal. No increase in right ventricular wall thickness.  5. Left atrial size was moderately dilated.  6. TR signal is inadequate for assessing pulmonary artery systolic pressure.  7. Definity contrast agent was given IV to delineate the left ventricular endocardial borders. FINDINGS  Left Ventricle: Left ventricular ejection fraction, by visual estimation, is 60 to 65%. The left ventricle has normal function. Definity contrast agent was given IV to delineate the left ventricular endocardial borders. The left ventricle has no regional wall motion abnormalities. There is no left ventricular hypertrophy. Left ventricular diastolic parameters are consistent with Grade I diastolic dysfunction (impaired relaxation). Normal left atrial pressure. Right Ventricle: The right ventricular size is normal. No increase in right ventricular wall thickness. Global RV systolic function is has normal systolic function. Left Atrium: Left atrial size was moderately dilated. Right Atrium: Right atrial size was normal in size Pericardium: There is no evidence of pericardial effusion. Mitral Valve: The mitral valve is normal in structure. Moderate mitral annular calcification. Mild mitral valve regurgitation. No evidence of mitral valve stenosis by observation. MV peak gradient, 10.1 mmHg. Tricuspid Valve: The tricuspid valve is normal in structure. Tricuspid valve regurgitation is mild. Aortic Valve: The aortic valve is normal in structure. Aortic valve regurgitation is not visualized. The aortic valve is structurally normal, with no evidence of sclerosis or stenosis. Aortic valve mean gradient measures 8.0 mmHg. Aortic valve peak gradient measures 14.0 mmHg. Aortic valve area, by VTI measures 2.51 cm. Pulmonic Valve: The pulmonic valve was normal in  structure. Pulmonic valve regurgitation is not visualized. Pulmonic regurgitation is not visualized. Aorta: The aortic root, ascending aorta and aortic arch are all structurally normal, with no evidence of dilitation or obstruction. Venous: The inferior vena cava is normal in size with greater than 50% respiratory variability, suggesting right atrial pressure of 3 mmHg. IAS/Shunts: No atrial level shunt detected by color flow Doppler. There is no evidence of a patent foramen ovale. No ventricular septal defect is seen or detected. There is no evidence of an atrial septal defect.  LEFT VENTRICLE PLAX 2D LVIDd:         4.19 cm  Diastology LVIDs:         2.55 cm  LV e' lateral:   6.09 cm/s LV PW:         1.07 cm  LV E/e' lateral: 19.9 LV IVS:        1.12 cm  LV e' medial:    7.29 cm/s LVOT diam:     1.90 cm  LV E/e' medial:  16.6 LV SV:         55 ml LV SV Index:   21.92 LVOT Area:     2.84 cm  LEFT ATRIUM              Index LA diam:        5.10 cm  2.19 cm/m LA Vol (A2C):   79.5 ml  34.10 ml/m LA Vol (A4C):   105.0 ml 45.03 ml/m LA Biplane Vol: 90.3 ml  38.73 ml/m  AORTIC VALVE                    PULMONIC VALVE AV Area (Vmax):    2.32 cm     PV Vmax:       1.36 m/s AV Area (Vmean):   2.62 cm     PV Vmean:      89.500 cm/s AV Area (VTI):  2.51 cm     PV VTI:        0.248 m AV Vmax:           187.00 cm/s  PV Peak grad:  7.4 mmHg AV Vmean:          130.000 cm/s PV Mean grad:  4.0 mmHg AV VTI:            0.342 m AV Peak Grad:      14.0 mmHg AV Mean Grad:      8.0 mmHg LVOT Vmax:         153.00 cm/s LVOT Vmean:        120.000 cm/s LVOT VTI:          0.303 m LVOT/AV VTI ratio: 0.89  AORTA Ao Root diam: 3.00 cm MITRAL VALVE MV Area (PHT): 3.03 cm              SHUNTS MV Peak grad:  10.1 mmHg             Systemic VTI:  0.30 m MV Mean grad:  6.0 mmHg              Systemic Diam: 1.90 cm MV Vmax:       1.59 m/s MV Vmean:      115.0 cm/s MV VTI:        0.47 m MV PHT:        72.50 msec MV Decel Time: 250 msec MV E  velocity: 121.00 cm/s 103 cm/s MV A velocity: 133.00 cm/s 70.3 cm/s MV E/A ratio:  0.91        1.5  Ida Rogue MD Electronically signed by Ida Rogue MD Signature Date/Time: 06/06/2019/5:52:43 PM    Final    ECHOCARDIOGRAM COMPLETE BUBBLE STUDY  Result Date: 06/03/2019   ECHOCARDIOGRAM REPORT   Patient Name:   MONTGOMERY WILLMANN Date of Exam: 06/03/2019 Medical Rec #:  [id number]    Height:       68.0 in Accession #:    [id number]   Weight:       246.0 lb Date of Birth:  22-Jan-1971    BSA:          2.23 m Patient Age:    23 years     BP:           111/37 mmHg Patient Gender: M            HR:           97 bpm. Exam Location:  [facility] Procedure: 2D Echo Indications:     STROKE 434.91/I63.9  History:         Patient has no prior history of Echocardiogram examinations.                  Risk Factors:Hypertension and Diabetes. ESRD.  Sonographer:     Avanell Shackleton Referring Phys:  [place] Diagnosing Phys: Serafina Royals MD  Sonographer Comments: Technically challenging study due to limited acoustic windows, Technically difficult study due to poor echo windows, suboptimal parasternal window and suboptimal apical window. Image acquisition challenging due to patient body habitus. PT COULD NOT TURN ON HIS LEFT SIDE. BUBBLE STUDY WAS ATTEMPTED TWICE IMPRESSIONS  1. Left ventricular ejection fraction, by visual estimation, is 60 to 65%. The left ventricle has normal function. There is no left ventricular hypertrophy.  2. The left ventricle has no regional wall motion abnormalities.  3. Global right ventricle has normal systolic function.The right ventricular size is  normal. No increase in right ventricular wall thickness.  4. Left atrial size was normal.  5. Right atrial size was normal.  6. The mitral valve is normal in structure. Mild mitral valve regurgitation.  7. The tricuspid valve is normal in structure. Tricuspid valve regurgitation is mild.  8. The aortic valve is normal in structure. Aortic valve  regurgitation is mild.  9. The pulmonic valve was normal in structure. Pulmonic valve regurgitation is not visualized. FINDINGS  Left Ventricle: Left ventricular ejection fraction, by visual estimation, is 60 to 65%. The left ventricle has normal function. The left ventricle has no regional wall motion abnormalities. There is no left ventricular hypertrophy. Right Ventricle: The right ventricular size is normal. No increase in right ventricular wall thickness. Global RV systolic function is has normal systolic function. Left Atrium: Left atrial size was normal in size. Right Atrium: Right atrial size was normal in size Pericardium: There is no evidence of pericardial effusion. Mitral Valve: The mitral valve is normal in structure. Mild mitral valve regurgitation. Tricuspid Valve: The tricuspid valve is normal in structure. Tricuspid valve regurgitation is mild. Aortic Valve: The aortic valve is normal in structure. Aortic valve regurgitation is mild. Aortic valve mean gradient measures 8.0 mmHg. Aortic valve peak gradient measures 12.7 mmHg. Aortic valve area, by VTI measures 2.41 cm. Pulmonic Valve: The pulmonic valve was normal in structure. Pulmonic valve regurgitation is not visualized. Pulmonic regurgitation is not visualized. Aorta: The aortic root, ascending aorta and aortic arch are all structurally normal, with no evidence of dilitation or obstruction. IAS/Shunts: No atrial level shunt detected by color flow Doppler. Agitated saline contrast was given intravenously to evaluate for intracardiac shunting.  LEFT VENTRICLE PLAX 2D LVIDd:         4.74 cm LVIDs:         2.81 cm LV PW:         1.20 cm LV IVS:        1.47 cm LVOT diam:     2.00 cm LV SV:         75 ml LV SV Index:   31.60 LVOT Area:     3.14 cm  IVC IVC diam: 2.10 cm LEFT ATRIUM            Index LA diam:      5.30 cm  2.37 cm/m LA Vol (A4C): 114.0 ml 51.08 ml/m  AORTIC VALVE AV Area (Vmax):    2.72 cm AV Area (Vmean):   2.51 cm AV Area (VTI):      2.41 cm AV Vmax:           178.00 cm/s AV Vmean:          129.000 cm/s AV VTI:            0.319 m AV Peak Grad:      12.7 mmHg AV Mean Grad:      8.0 mmHg LVOT Vmax:         154.00 cm/s LVOT Vmean:        103.000 cm/s LVOT VTI:          0.245 m LVOT/AV VTI ratio: 0.77  AORTA Ao Root diam: 3.10 cm MITRAL VALVE MV Area (PHT): 7.16 cm              SHUNTS MV PHT:        30.74 msec            Systemic VTI:  0.24 m MV Decel Time:  106 msec              Systemic Diam: 2.00 cm MV E velocity: 160.00 cm/s 103 cm/s MV A velocity: 171.00 cm/s 70.3 cm/s MV E/A ratio:  0.94        1.5  Serafina Royals MD Electronically signed by Serafina Royals MD Signature Date/Time: 06/03/2019/2:08:13 PM    Final     Microbiology No results found for this or any previous visit (from the past 240 hour(s)).  Lab Basic Metabolic Panel: Recent Labs  Lab 06/20/19 0445 06/21/19 0014 06/21/19 0432 06/22/19 0801 06/23/19 0535 06/24/19 0447 07-07-2019 0636  NA 132*  --  134* 131* 133* 132* 130*  K 4.9  --  4.5 5.4* 4.3 4.6 5.2*  CL 93*  --  96* 94* 94* 93* 93*  CO2 24  --  25 23 26 25 22   GLUCOSE 180*  --  173* 170* 143* 129* 111*  BUN 138*  --  112* 132* 76* 96* 111*  CREATININE 8.52*  --  6.73* 7.93* 5.19* 6.46* 7.12*  CALCIUM 7.1*  --  7.4* 7.1* 7.8* 7.9* 7.8*  MG 2.1  --   --   --   --  2.0  --   PHOS 9.0* 7.4* 7.1* 8.0* 5.3*  --   --    Liver Function Tests: Recent Labs  Lab 06/22/19 0801 06/23/19 0535  ALBUMIN 1.9* 2.1*   No results for input(s): LIPASE, AMYLASE in the last 168 hours. No results for input(s): AMMONIA in the last 168 hours. CBC: Recent Labs  Lab 06/19/19 0538 06/20/19 0445 06/21/19 0432 06/21/19 1856 06/22/19 0801 06/23/19 0535 06/24/19 0447 Jul 07, 2019 0636  WBC 6.0 8.7 7.3  --  7.7 8.0 8.1 7.6  NEUTROABS 4.1 5.9 5.2  --   --   --   --  5.2  HGB 8.6* 7.5* 7.4* 7.5* 6.8* 6.8* 7.4* 6.9*  HCT 27.8* 24.4* 23.8* 24.3* 21.6* 22.0* 23.8* 22.4*  MCV 99.6 99.6 99.2  --  98.2 99.5 98.3 97.8   PLT 301 327 286  --  273 278 257 222   Cardiac Enzymes: No results for input(s): CKTOTAL, CKMB, CKMBINDEX, TROPONINI in the last 168 hours. Sepsis Labs: Recent Labs  Lab 06/22/19 0801 06/23/19 0535 06/24/19 0447 07-Jul-2019 0636  WBC 7.7 8.0 8.1 7.6     Jaydan Meidinger 07/07/2019, 4:16 PM

## 2019-07-16 NOTE — Progress Notes (Addendum)
Reason for consult:  Encephalopathy   Patient intubated and on sedation   ROS:  Unable to obtain due to poor mental status  Examination  Vital signs in last 24 hours: Temp:  [98.3 F (36.8 C)-98.5 F (36.9 C)] 98.4 F (36.9 C) (01/11 0200) Pulse Rate:  [69-88] 73 (01/11 0300) Resp:  [11-19] 14 (01/11 0300) BP: (110-187)/(34-123) 144/41 (01/11 0300) SpO2:  [99 %-100 %] 100 % (01/11 0838) FiO2 (%):  [28 %] 28 % (01/11 0838)  General: lying in bed CVS: pulse-normal rate and rhythm RS: breathing comfortably Extremities: normal   Neuro: ( limited due to sedation)  MS: unresponsive, not following any commands CN: pupils equal and reactive, right gaze deviation for few seconds with subsequent return to midline,  Motor: no withdrawal in bilateral upper extremities Plantars: mute    Basic Metabolic Panel: Recent Labs  Lab 06/20/19 0445 06/21/19 0014 06/21/19 0432 06/22/19 0801 06/23/19 0535 06/24/19 0447 2019-07-08 0636  NA 132*  --  134* 131* 133* 132* 130*  K 4.9  --  4.5 5.4* 4.3 4.6 5.2*  CL 93*  --  96* 94* 94* 93* 93*  CO2 24  --  25 23 26 25 22   GLUCOSE 180*  --  173* 170* 143* 129* 111*  BUN 138*  --  112* 132* 76* 96* 111*  CREATININE 8.52*  --  6.73* 7.93* 5.19* 6.46* 7.12*  CALCIUM 7.1*  --  7.4* 7.1* 7.8* 7.9* 7.8*  MG 2.1  --   --   --   --  2.0  --   PHOS 9.0* 7.4* 7.1* 8.0* 5.3*  --   --     CBC: Recent Labs  Lab 06/19/19 0538 06/20/19 0445 06/21/19 0432 06/21/19 1856 06/22/19 0801 06/23/19 0535 06/24/19 0447 2019/07/08 0636  WBC 6.0 8.7 7.3  --  7.7 8.0 8.1 7.6  NEUTROABS 4.1 5.9 5.2  --   --   --   --  5.2  HGB 8.6* 7.5* 7.4* 7.5* 6.8* 6.8* 7.4* 6.9*  HCT 27.8* 24.4* 23.8* 24.3* 21.6* 22.0* 23.8* 22.4*  MCV 99.6 99.6 99.2  --  98.2 99.5 98.3 97.8  PLT 301 327 286  --  273 278 257 222     Coagulation Studies: No results for input(s): LABPROT, INR in the last 72 hours.  Imaging Reviewed:  MRI brain on 11/23, 12/20, 12/30  EEG   12/31 This is an abnormal EEG secondary to general background slowing.  This finding may be seen with a diffuse disturbance that is etiologically nonspecific, but may include a metabolic encephalopathy or medication effect, among other possibilities.  No epileptiform activity is noted.    EEG: 12/23 IMPRESSION: This study is suggestive of moderate to severe diffuse  encephalopathy, nonspecific etiology. No seizures or epileptiform discharges were seen throughout the recording.    ASSESSMENT AND PLAN  49 year old male with end-stage renal disease, type 1 diabetes mellitus, hypertension, sleep apnea admitted with seizure activity thought to be provoked.   Also noted to have acute hypoxic respiratory failure with acute pneumonia and prior COVID-19 infection. (SARS COV-2 negative this admission)  Hospital course complicated with septic/cardiogenic shock  MRI brain noted to have bilateral infarctions. MRI brain acute/subacute infarcts in deep watershed territories, likely in the setting of septic/cardiogenic shock, hypoxia. Less likely to be embolic given distribution, discussed this with neuro-radiologist as well. TEE negative for endocarditis.   Patient has had a prolonged hospitalization for persistent encephalopathy.  He is had R EEG  x 2 with no seizures/epileptiform discharges.  ? Hemi-neglect mentioned in exam, intermittent seizures.    Acute Metabolic Encephalopathy Uremic encephalopathy Bilateral deep watershed infarctions Seizure activity with EEG x2 negative   Recommendations - Continue Dialysis, correction of uremia to assess if mental status improves - wean sedation as possible - continue Keppra 500mg  BID  - Continue ASA, plavix ( can d/c plavix in 1 week)  - discussed with CCM atleast from a stroke standpoint, patient may make a good neurological recovery, however, goals of care must be taken with consideration of patients multiple medical issues.   The patient is  neurologically critically ill due to persistent encephalopathy requiring intubation.  Patient has had multiple infarcts, possible seizures, uremic encephalopathy. Patient is high risk of neurological deterioration due to worsening seizures, worsening stroke as well as complications of prolonged ICU stay such as respiratory failure, aspiration, cardiac arrest.  I spent 35 minutes in the care of this patient.     Karena Addison Arriel Victor [facility] Pager Number [phone] For questions after 7pm please refer to AMION to reach the Neurologist on call

## 2019-07-16 DEATH — deceased

## 2019-10-16 ENCOUNTER — Ambulatory Visit (INDEPENDENT_AMBULATORY_CARE_PROVIDER_SITE_OTHER): Payer: Medicare Other | Admitting: Vascular Surgery

## 2019-10-16 ENCOUNTER — Encounter (INDEPENDENT_AMBULATORY_CARE_PROVIDER_SITE_OTHER): Payer: Medicare Other

## 2020-02-29 IMAGING — US US CAROTID DUPLEX BILAT
1 series · 13 of 24 positions shown · non-contrast
Comparison: Brain MRI 06/03/2019

CLINICAL DATA: 48-year-old male with acute altered mental status,
possible stroke

EXAM:
BILATERAL CAROTID DUPLEX ULTRASOUND
TECHNIQUE: Gray scale imaging, color Doppler and duplex ultrasound were
performed of bilateral carotid and vertebral arteries in the neck.

[Series 1: us carotid duplex bilat · 13 of 66 slices shown]
[im 1/66]
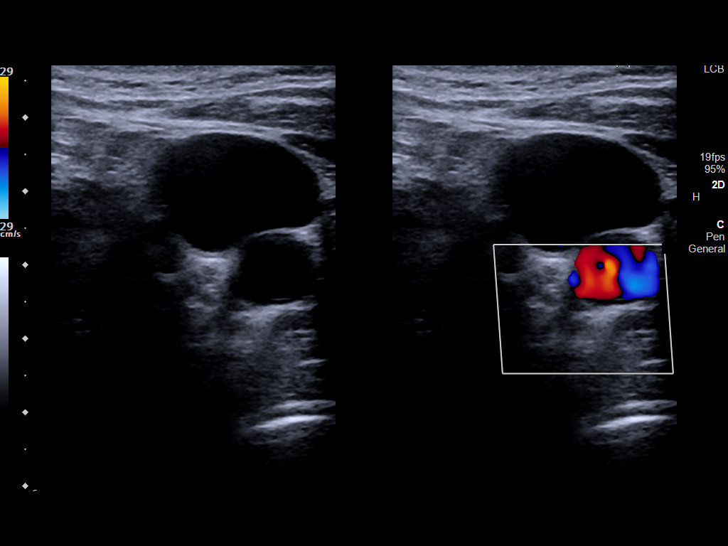
[im 6/66]
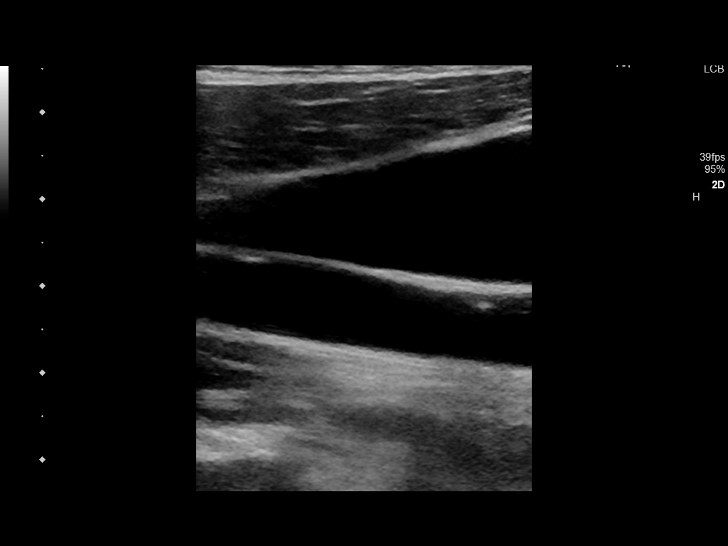
[im 12/66]
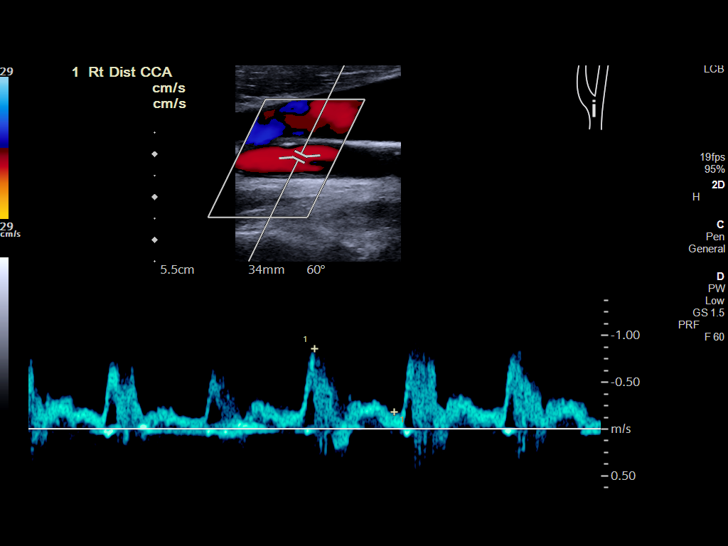
[im 17/66]
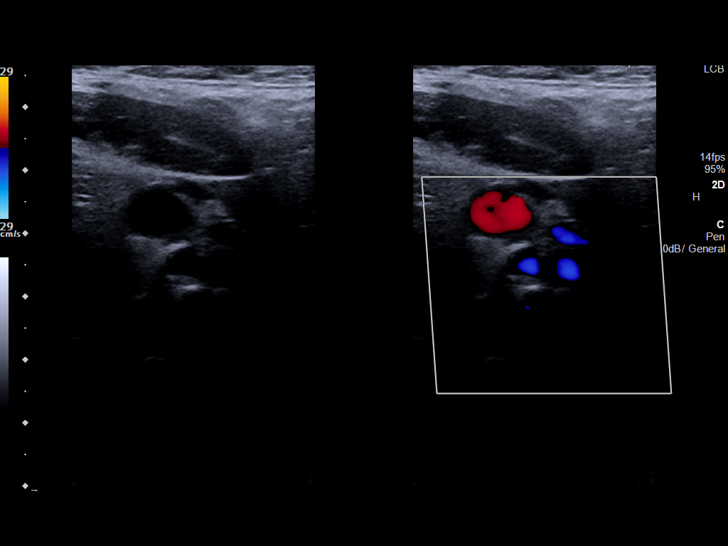
[im 23/66]
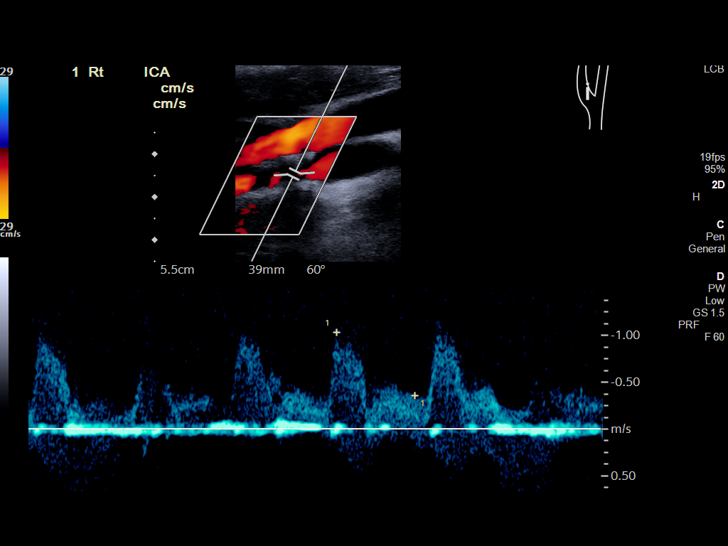
[im 29/66]
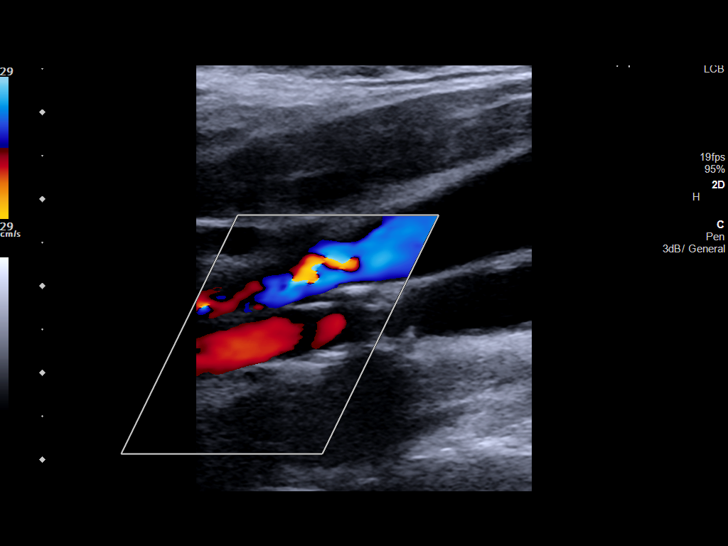
[im 34/66]
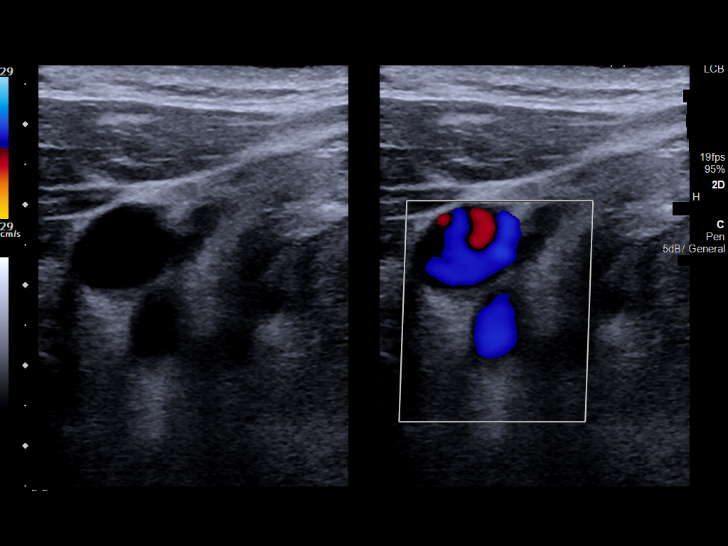
[im 37/66]
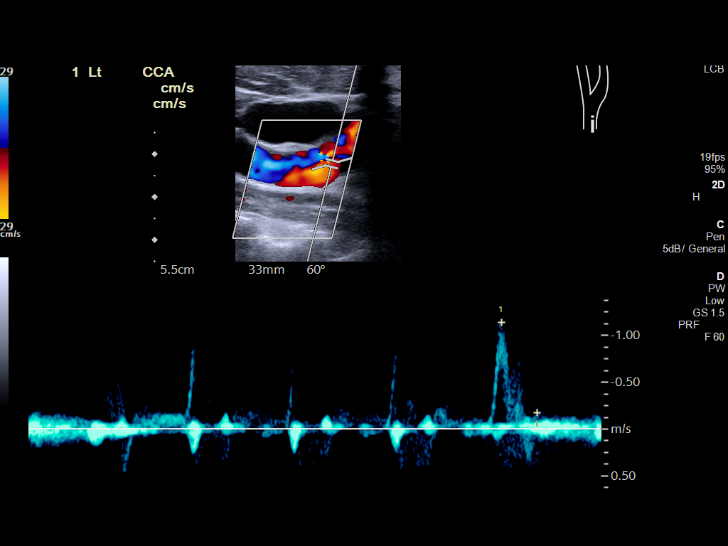
[im 43/66]
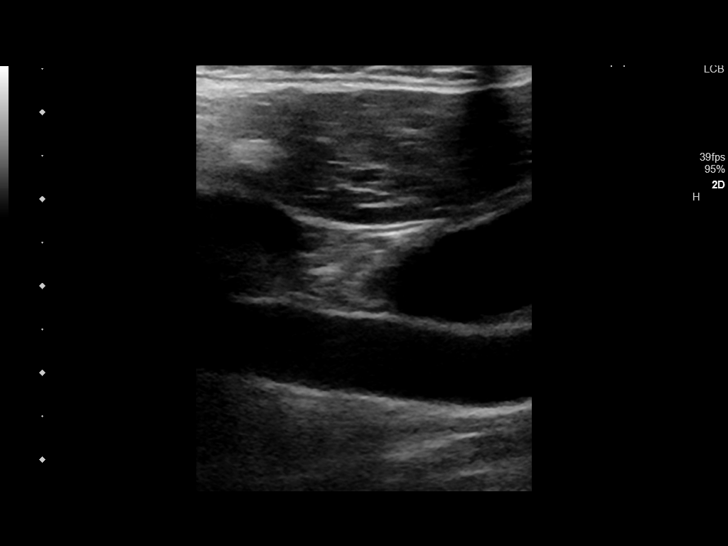
[im 49/66]
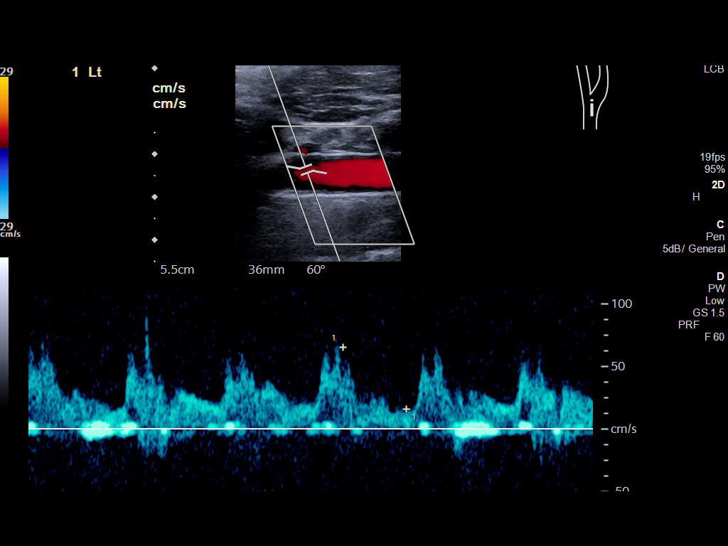
[im 54/66]
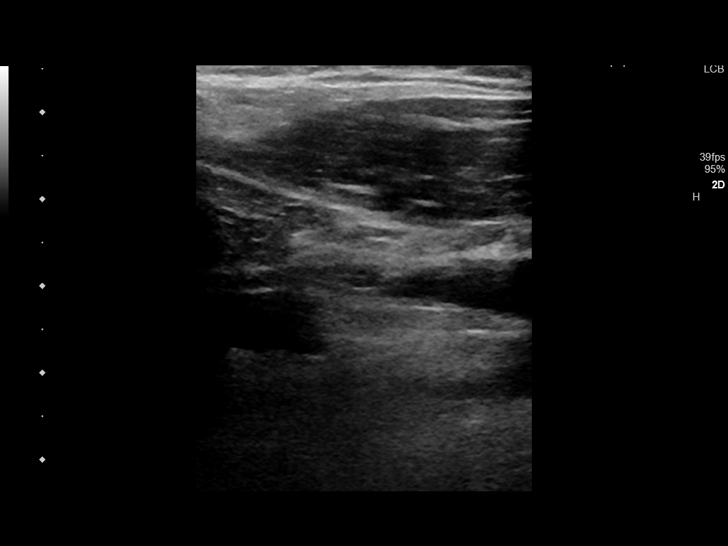
[im 60/66]
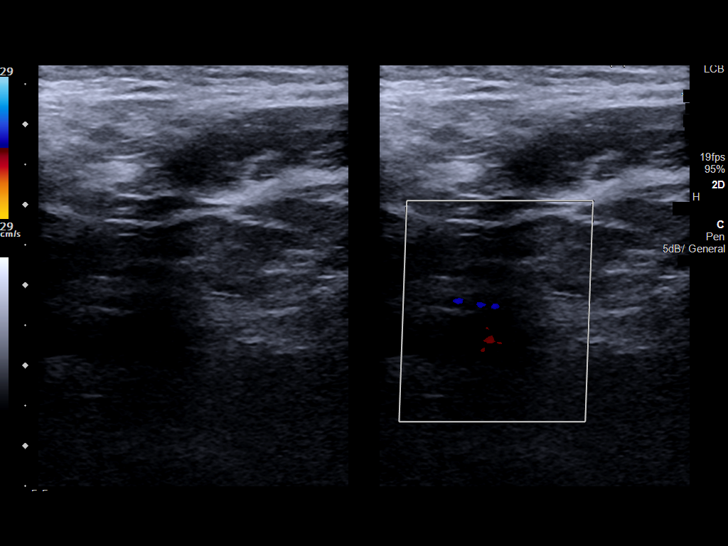
[im 66/66]
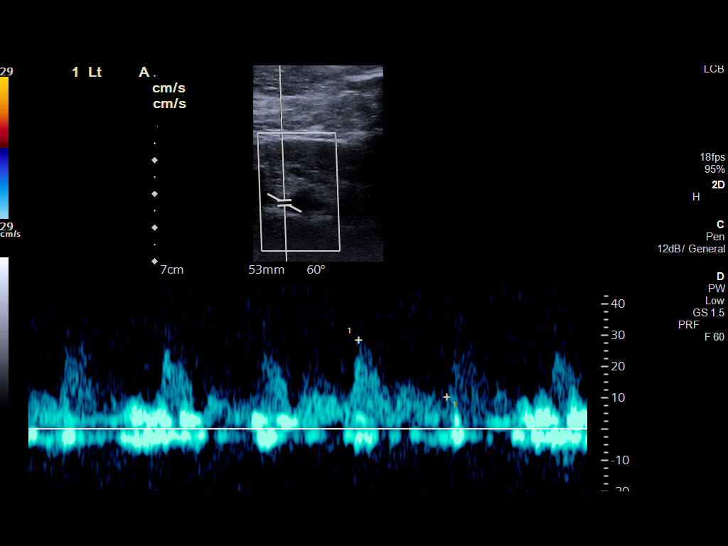

[13 of 24 positions shown; findings below may reference images not displayed]

FINDINGS: Criteria: Quantification of carotid stenosis is based on velocity
parameters that correlate the residual internal carotid diameter
with NASCET-based stenosis levels, using the diameter of the distal
internal carotid lumen as the denominator for stenosis measurement.

The following velocity measurements were obtained:

RIGHT
ICA: 103/36 cm/sec
CCA: 108/24 cm/sec

SYSTOLIC ICA/CCA RATIO:

ECA:  171 cm/sec

LEFT

ICA: 79/24 cm/sec

CCA: 131/23 cm/sec

SYSTOLIC ICA/CCA RATIO:

ECA:  106 cm/sec

RIGHT CAROTID ARTERY: Mild heterogeneous atherosclerotic plaque in
the proximal internal carotid artery. By peak systolic velocity
criteria, the estimated stenosis remains less than 50%.

RIGHT VERTEBRAL ARTERY:  Patent with normal antegrade flow.

LEFT CAROTID ARTERY: No significant atherosclerotic plaque or
evidence of stenosis in the internal carotid artery.

LEFT VERTEBRAL ARTERY:  Patent with normal antegrade flow.
IMPRESSION: Mild (1-49%) stenosis proximal right internal carotid artery
secondary to heterogenous atherosclerotic plaque.

No significant atherosclerotic plaque or evidence of stenosis in the
left internal carotid artery.

The vertebral arteries are patent with normal antegrade flow.
